# Patient Record
Sex: Male | Born: 1960
Health system: Southern US, Community
[De-identification: ages and names within clinical notes are randomized; demographics above are authoritative.]

## PROBLEM LIST (undated history)

## (undated) DIAGNOSIS — I2699 Other pulmonary embolism without acute cor pulmonale: Secondary | ICD-10-CM

## (undated) DIAGNOSIS — M109 Gout, unspecified: Secondary | ICD-10-CM

## (undated) DIAGNOSIS — M51369 Other intervertebral disc degeneration, lumbar region without mention of lumbar back pain or lower extremity pain: Secondary | ICD-10-CM

## (undated) DIAGNOSIS — E119 Type 2 diabetes mellitus without complications: Secondary | ICD-10-CM

## (undated) DIAGNOSIS — C801 Malignant (primary) neoplasm, unspecified: Secondary | ICD-10-CM

## (undated) DIAGNOSIS — I10 Essential (primary) hypertension: Secondary | ICD-10-CM

## (undated) DIAGNOSIS — M5136 Other intervertebral disc degeneration, lumbar region: Secondary | ICD-10-CM

## (undated) DIAGNOSIS — K529 Noninfective gastroenteritis and colitis, unspecified: Secondary | ICD-10-CM

## (undated) DIAGNOSIS — E785 Hyperlipidemia, unspecified: Secondary | ICD-10-CM

## (undated) DIAGNOSIS — F17200 Nicotine dependence, unspecified, uncomplicated: Secondary | ICD-10-CM

## (undated) HISTORY — DX: Other intervertebral disc degeneration, lumbar region without mention of lumbar back pain or lower extremity pain: M51.369

## (undated) HISTORY — DX: Other pulmonary embolism without acute cor pulmonale: I26.99

## (undated) HISTORY — DX: Other intervertebral disc degeneration, lumbar region: M51.36

## (undated) HISTORY — PX: COLONOSCOPY: SHX174

## (undated) HISTORY — DX: Hyperlipidemia, unspecified: E78.5

## (undated) HISTORY — PX: TONSILLECTOMY: SUR1361

## (undated) HISTORY — DX: Noninfective gastroenteritis and colitis, unspecified: K52.9

## (undated) HISTORY — DX: Nicotine dependence, unspecified, uncomplicated: F17.200

## (undated) HISTORY — DX: Gout, unspecified: M10.9

---

## 1998-02-28 ENCOUNTER — Emergency Department (HOSPITAL_COMMUNITY): Admission: EM | Admit: 1998-02-28 | Discharge: 1998-02-28 | Payer: Self-pay | Admitting: Emergency Medicine

## 1998-03-02 ENCOUNTER — Emergency Department (HOSPITAL_COMMUNITY): Admission: EM | Admit: 1998-03-02 | Discharge: 1998-03-02 | Payer: Self-pay | Admitting: Emergency Medicine

## 1998-03-09 ENCOUNTER — Ambulatory Visit: Admission: RE | Admit: 1998-03-09 | Discharge: 1998-03-09 | Payer: Self-pay | Admitting: Otolaryngology

## 1998-04-29 ENCOUNTER — Ambulatory Visit: Admission: RE | Admit: 1998-04-29 | Discharge: 1998-04-29 | Payer: Self-pay | Admitting: Otolaryngology

## 1998-08-24 ENCOUNTER — Other Ambulatory Visit: Admission: RE | Admit: 1998-08-24 | Discharge: 1998-08-24 | Payer: Self-pay | Admitting: Otolaryngology

## 2001-12-18 ENCOUNTER — Encounter: Payer: Self-pay | Admitting: Family Medicine

## 2001-12-18 ENCOUNTER — Encounter: Admission: RE | Admit: 2001-12-18 | Discharge: 2001-12-18 | Payer: Self-pay | Admitting: Family Medicine

## 2001-12-30 ENCOUNTER — Ambulatory Visit (HOSPITAL_COMMUNITY): Admission: RE | Admit: 2001-12-30 | Discharge: 2001-12-30 | Payer: Self-pay | Admitting: Family Medicine

## 2003-01-19 ENCOUNTER — Encounter: Admission: RE | Admit: 2003-01-19 | Discharge: 2003-04-19 | Payer: Self-pay | Admitting: Internal Medicine

## 2007-04-11 ENCOUNTER — Emergency Department (HOSPITAL_COMMUNITY): Admission: EM | Admit: 2007-04-11 | Discharge: 2007-04-11 | Payer: Self-pay | Admitting: Emergency Medicine

## 2007-05-21 ENCOUNTER — Encounter: Admission: RE | Admit: 2007-05-21 | Discharge: 2007-08-19 | Payer: Self-pay | Admitting: Internal Medicine

## 2009-03-23 ENCOUNTER — Emergency Department (HOSPITAL_COMMUNITY): Admission: EM | Admit: 2009-03-23 | Discharge: 2009-03-23 | Payer: Self-pay | Admitting: Emergency Medicine

## 2011-02-19 LAB — COMPREHENSIVE METABOLIC PANEL
ALT: 16 U/L (ref 0–53)
AST: 19 U/L (ref 0–37)
Alkaline Phosphatase: 74 U/L (ref 39–117)
CO2: 29 mEq/L (ref 19–32)
Chloride: 105 mEq/L (ref 96–112)
Creatinine, Ser: 1.05 mg/dL (ref 0.4–1.5)
GFR calc Af Amer: >60 mL/min (ref >60)
GFR calc non Af Amer: >60 mL/min (ref >60)
Potassium: 3.4 mEq/L — ABNORMAL LOW (ref 3.5–5.1)
Total Bilirubin: 0.7 mg/dL (ref 0.3–1.2)

## 2011-02-19 LAB — DIFFERENTIAL
Basophils Absolute: 0 10*3/uL (ref 0.0–0.1)
Basophils Relative: 1 % (ref 0–1)
Eosinophils Absolute: 0.2 10*3/uL (ref 0.0–0.7)
Eosinophils Relative: 3 % (ref 0–5)
Lymphocytes Relative: 26 % (ref 12–46)
Monocytes Absolute: 0.5 10*3/uL (ref 0.1–1.0)

## 2011-02-19 LAB — CBC
MCV: 89.6 fL (ref 78.0–100.0)
RBC: 4.45 MIL/uL (ref 4.22–5.81)
WBC: 6.3 10*3/uL (ref 4.0–10.5)

## 2011-02-19 LAB — HEMOCCULT GUIAC POC 1CARD (OFFICE): Fecal Occult Bld: POSITIVE

## 2013-04-14 ENCOUNTER — Emergency Department (HOSPITAL_COMMUNITY)
Admission: EM | Admit: 2013-04-14 | Discharge: 2013-04-14 | Disposition: A | Discharge status: Home / Self Care | Payer: 59 | Attending: Emergency Medicine | Admitting: Emergency Medicine

## 2013-04-14 ENCOUNTER — Emergency Department (HOSPITAL_COMMUNITY): Payer: 59

## 2013-04-14 ENCOUNTER — Encounter (HOSPITAL_COMMUNITY): Payer: Self-pay | Admitting: Emergency Medicine

## 2013-04-14 DIAGNOSIS — Z79899 Other long term (current) drug therapy: Secondary | ICD-10-CM | POA: Insufficient documentation

## 2013-04-14 DIAGNOSIS — F17200 Nicotine dependence, unspecified, uncomplicated: Secondary | ICD-10-CM | POA: Insufficient documentation

## 2013-04-14 DIAGNOSIS — E119 Type 2 diabetes mellitus without complications: Secondary | ICD-10-CM | POA: Insufficient documentation

## 2013-04-14 DIAGNOSIS — I10 Essential (primary) hypertension: Secondary | ICD-10-CM | POA: Insufficient documentation

## 2013-04-14 HISTORY — DX: Type 2 diabetes mellitus without complications: E11.9

## 2013-04-14 HISTORY — DX: Essential (primary) hypertension: I10

## 2013-04-14 LAB — URINALYSIS, ROUTINE W REFLEX MICROSCOPIC
Bilirubin Urine: NEGATIVE
Ketones, ur: NEGATIVE mg/dL
Nitrite: NEGATIVE
Specific Gravity, Urine: 1.006 (ref 1.005–1.030)
Urobilinogen, UA: 1 mg/dL (ref 0.0–1.0)
pH: 7 (ref 5.0–8.0)

## 2013-04-14 LAB — CBC
MCHC: 34.9 g/dL (ref 30.0–36.0)
Platelets: 201 10*3/uL (ref 150–400)
RDW: 13.7 % (ref 11.5–15.5)
WBC: 5.9 10*3/uL (ref 4.0–10.5)

## 2013-04-14 LAB — BASIC METABOLIC PANEL
Chloride: 102 mEq/L (ref 96–112)
GFR calc Af Amer: >90 mL/min (ref >90)
GFR calc non Af Amer: >90 mL/min (ref >90)
Potassium: 2.8 mEq/L — ABNORMAL LOW (ref 3.5–5.1)

## 2013-04-14 LAB — URINE MICROSCOPIC-ADD ON

## 2013-04-14 MED ORDER — HYDROCODONE-ACETAMINOPHEN 5-325 MG PO TABS
1.0000 | ORAL_TABLET | Freq: Once | ORAL | Status: AC
  Administered 2013-04-14: 1 via ORAL
  Filled 2013-04-14: qty 1

## 2013-04-14 MED ORDER — IBUPROFEN 800 MG PO TABS
800.0000 mg | ORAL_TABLET | Freq: Once | ORAL | Status: AC
  Administered 2013-04-14: 800 mg via ORAL
  Filled 2013-04-14: qty 1

## 2013-04-14 MED ORDER — HYDROCODONE-ACETAMINOPHEN 5-325 MG PO TABS: 1.0000 | ORAL_TABLET | Freq: Four times a day (QID) | ORAL | Status: DC | PRN

## 2013-04-14 NOTE — ED Notes (Addendum)
C/o intermittent headache over the past 2 weeks.  No neuro deficits noted on triage exam.  Reports being seen at [facility] on Friday for swelling to L wrist and was started on celebrex and blood pressure medication.  Reports history of HTN but prior to Friday had been off of medication due to insurance reasons for over 2 years.

## 2013-04-14 NOTE — ED Notes (Signed)
Pt presents with persistent headache for the past 2 years, states he stopped taking his hypertension medication 2 years ago.  Pt BP elevated upon arrival to room 209/129.  Pt states he went to Urgent Care last week and was given samples of medications.  Pt instructed to return to ED if headaches continued.  Alert and oriented X4, neuro exam normal.

## 2013-04-14 NOTE — ED Provider Notes (Signed)
History     CSN: [id number]  Arrival date & time 04/14/13  1700   First MD Initiated Contact with Patient 04/14/13 1932      Chief Complaint  Patient presents with  . Headache  . Hypertension    (Consider location/radiation/quality/duration/timing/severity/associated sxs/prior treatment) HPI  Samuel Butler is a 52 y.o. male with a past medical history of DM and HTN who presents to the ED with a complaint of headache. He was seen at urgent care 2 weeks ago and was incidentally treated for HTN for which he has been noncompliant for over 2 years. He has experienced intermittent headaches for the past 2.5 years since he has been off of his hypertension medication and reports that the HAs are getting worse. The pt states that for the last week he has awoken with headaches that have lasted "half the day." The headaches are located above the pt's left eye, worsen upon ambulation, and are associated with blurry vision and black spots. Pt denies dizziness, confusion, nuchal rigidity, nausea, vomiting, or diarrhea. Pt smokes 5 cigarettes/day.   Past Medical History  Diagnosis Date  . Hypertension   . Diabetes mellitus without complication     Past Surgical History  Procedure Laterality Date  . Tonsillectomy    . Colonoscopy      No family history on file.  History  Substance Use Topics  . Smoking status: Current Every Day Smoker  . Smokeless tobacco: Not on file  . Alcohol Use: Yes     Comment: occasional      Review of Systems  Allergies  Review of patient's allergies indicates no known allergies.  Home Medications   Current Outpatient Rx  Name  Route  Sig  Dispense  Refill  . acetaminophen (TYLENOL) 500 MG tablet   Oral   Take 1,000 mg by mouth every 6 (six) hours as needed for pain.         Marland Kitchen ibuprofen (ADVIL,MOTRIN) 200 MG tablet   Oral   Take 200 mg by mouth every 6 (six) hours as needed for pain.         . naproxen sodium (ANAPROX) 220 MG tablet   Oral   Take 220 mg by mouth 2 (two) times daily as needed (pain).         . celecoxib (CELEBREX) 200 MG capsule   Oral   Take 200 mg by mouth 2 (two) times daily.         Marland Kitchen telmisartan (MICARDIS) 80 MG tablet   Oral   Take 80 mg by mouth 3 (three) times daily.           BP 209/129  Pulse 76  Temp(Src) 98.2 F (36.8 C) (Oral)  Resp 18  SpO2 99%  Physical Exam  Nursing note and vitals reviewed. Constitutional: He is oriented to person, place, and time. He appears well-developed and well-nourished. No distress.  HENT:  Head: Normocephalic and atraumatic.  Eyes: Conjunctivae and EOM are normal. Pupils are equal, round, and reactive to light. No scleral icterus.  Neck: Normal range of motion.  Neck supple with no nuchal rigidity, full pain free ROM. No carotid bruit  Cardiovascular: Normal rate, regular rhythm, normal heart sounds and intact distal pulses.   Pulmonary/Chest: Effort normal and breath sounds normal. No respiratory distress.  Musculoskeletal: Normal range of motion.  Neurological: He is alert and oriented to person, place, and time. He has normal strength.  CN III-XII intact, good coordination, normal gait, strength 5/5  bilaterally, intact distal sensation.  Skin: Skin is warm and dry.  No rash, non diaphoretic    ED Course  Procedures (including critical care time)  Labs Reviewed  CBC  BASIC METABOLIC PANEL  URINALYSIS, ROUTINE W REFLEX MICROSCOPIC   Ct Head Wo Contrast  04/14/2013   *RADIOLOGY REPORT*  Clinical Data: Headache, hypertension.  CT HEAD WITHOUT CONTRAST  Technique:  Contiguous axial images were obtained from the base of the skull through the vertex without contrast.  Comparison: None.  Findings: Chronic small vessel changes noted within the left frontal white matter. No acute intracranial abnormality. Specifically, no hemorrhage, hydrocephalus, mass lesion, acute infarction, or significant intracranial injury.  No acute calvarial abnormality.  Visualized paranasal sinuses and mastoids clear. Orbital soft tissues unremarkable.  IMPRESSION: No acute intracranial abnormality.   Original Report Authenticated By: Charlett Nose, M.D.     No diagnosis found.    MDM  Hypertension Headache  52 year old male with a history of high blood pressure and diabetes as well as medication noncompliance x3 years presents emergency department complaining of headache and high blood pressure.  Patient was seen in urgent care  2 weeks ago and started on my part is without improvement of blood pressure.  Patient has a scheduled followup appointment with a new primary care physician this Friday.  Labs and imaging reviewed without signs of acute intercranial injury or organ failure.  Strict return precautions discussed.  Patient verbalizes understanding.  Patient is aware of importance to keep follow up appointment and medication compliance.        Jaci Carrel, [place] 04/15/13 0107

## 2013-04-14 NOTE — ED Notes (Signed)
Pt returned from CT °

## 2013-04-16 ENCOUNTER — Ambulatory Visit (INDEPENDENT_AMBULATORY_CARE_PROVIDER_SITE_OTHER): Payer: 59 | Admitting: Family Medicine

## 2013-04-16 ENCOUNTER — Encounter: Payer: Self-pay | Admitting: Family Medicine

## 2013-04-16 VITALS — BP 162/120 | Temp 98.3°F | Ht 72.0 in | Wt 225.0 lb

## 2013-04-16 DIAGNOSIS — E876 Hypokalemia: Secondary | ICD-10-CM

## 2013-04-16 DIAGNOSIS — I10 Essential (primary) hypertension: Secondary | ICD-10-CM

## 2013-04-16 DIAGNOSIS — E1165 Type 2 diabetes mellitus with hyperglycemia: Secondary | ICD-10-CM

## 2013-04-16 DIAGNOSIS — E785 Hyperlipidemia, unspecified: Secondary | ICD-10-CM

## 2013-04-16 DIAGNOSIS — Z7189 Other specified counseling: Secondary | ICD-10-CM

## 2013-04-16 DIAGNOSIS — Z7689 Persons encountering health services in other specified circumstances: Secondary | ICD-10-CM

## 2013-04-16 DIAGNOSIS — IMO0002 Reserved for concepts with insufficient information to code with codable children: Secondary | ICD-10-CM

## 2013-04-16 DIAGNOSIS — M109 Gout, unspecified: Secondary | ICD-10-CM

## 2013-04-16 HISTORY — DX: Gout, unspecified: M10.9

## 2013-04-16 LAB — LIPID PANEL
HDL: 54.9 mg/dL (ref >39.00)
Total CHOL/HDL Ratio: 4
Triglycerides: 51 mg/dL (ref 0.0–149.0)

## 2013-04-16 LAB — BASIC METABOLIC PANEL
BUN: 14 mg/dL (ref 6–23)
CO2: 34 mEq/L — ABNORMAL HIGH (ref 19–32)
GFR: 100.41 mL/min (ref >60.00)
Glucose, Bld: 92 mg/dL (ref 70–99)
Potassium: 3.4 mEq/L — ABNORMAL LOW (ref 3.5–5.1)
Sodium: 142 mEq/L (ref 135–145)

## 2013-04-16 LAB — MICROALBUMIN / CREATININE URINE RATIO: Microalb, Ur: 13.9 mg/dL — ABNORMAL HIGH (ref 0.0–1.9)

## 2013-04-16 LAB — URIC ACID: Uric Acid, Serum: 8.1 mg/dL — ABNORMAL HIGH (ref 4.0–7.8)

## 2013-04-16 MED ORDER — AMLODIPINE BESYLATE 5 MG PO TABS: 10.0000 mg | ORAL_TABLET | Freq: Every day | ORAL | Status: DC

## 2013-04-16 NOTE — ED Provider Notes (Signed)
Medical screening examination/treatment/procedure(s) were performed by non-physician practitioner and as supervising physician I was immediately available for consultation/collaboration.   Jordin Dambrosio M Felica Chargois, DO 04/16/13 1510 

## 2013-04-16 NOTE — Progress Notes (Signed)
Chief Complaint  Patient presents with  . Establish Care  . Hypertension    headache, blurred vision, pains in arms, neck, and shoulders     HPI:  Samuel Butler is here to establish care.  Last PCP and physical: has not been under a doctor's care in several years as has not had insurance until recently   Has the following chronic problems and concerns today:  1)HTN -reports long hx labile HTN, FH HTN - used to see Dr. Elisabeth Most, lost insurance several years ago and stopped all medications - used to be on several BP meds, now has insurance and wants to get back under care -Reviewed chart: ED visit 6/4 for HA, HTN with neg CT of head, CBC and BMP remarkable for hypokalemia not mentioned in ED notes -denies:denies CP, SOB, HA, SOB, Palpitations - was having HA, blurred vision when seen in ED now resolved  2)DM and HLD: -meds: used to be on medications for this but hasn't taken any medications in several years -last eye exam: can't remember - several years ago -home BS: no monitor at home -denies: polyuria, polydipsia, skin lesions  3)wrist swelling -about one month ago -swelled suddenly and painful, took -ibuprofen and went away -thinks gout, brother has gout -denies: fevers, chills, injury  4)Tobacco -not ready to quit today but trying to cut back  Review of systems neg except where stated above  Patient Active Problem List   Diagnosis Date Noted  . Essential hypertension, benign 04/16/2013  . Type II or unspecified type diabetes mellitus with unspecified complication, uncontrolled 04/16/2013  . Hyperlipemia 04/16/2013  . Gout 04/16/2013    ROS: See pertinent positives and negatives per HPI.  Past Medical History  Diagnosis Date  . Hypertension   . Diabetes mellitus without complication   . Hyperlipidemia     Family History  Problem Relation Age of Onset  . Hypertension Father   . Diabetes Father   . Stroke Father     History   Social History  .  Marital Status: Married    Spouse Name: N/A    Number of Children: N/A  . Years of Education: N/A   Social History Main Topics  . Smoking status: Current Every Day Smoker    Types: Cigarettes  . Smokeless tobacco: None     Comment: per patient 5 cigarettes a day   . Alcohol Use: Yes     Comment: occasional  . Drug Use: No  . Sexually Active: None   Other Topics Concern  . 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  GENERAL: vitals reviewed and listed above, alert, oriented, appears well hydrated and in no acute distress  HEENT: atraumatic, conjunttiva clear, no obvious abnormalities on inspection of external nose and ears  NECK: no obvious masses on inspection  LUNGS:  clear to auscultation bilaterally, no wheezes, rales or rhonchi, good air movement  CV: HRRR, no peripheral edema  MS: moves all extremities without noticeable abnormality  PSYCH: pleasant and cooperative, no obvious depression or anxiety  ASSESSMENT AND PLAN:  Discussed the following assessment and plan:  Encounter to establish care 52 yo AAM with MMP whom has not had medical care in several years due to loss of insurance here to establish care with multiple acute issues.  Essential hypertension, benign - Plan:  -Basic metabolic panel, amLODipine (NORVASC) 5 MG tablet -chronic HTN, on multiple meds in the past but lost insurance and has not had treatment in several  years -feel well today -discussed options and given ? Recent gout flare and hypokalemia on recent labs opted to start with norvasc (risks discussed), then may add arb or acei pending labs, close follow up, return and ED precautions  Type II or unspecified type diabetes mellitus with unspecified complication, uncontrolled - Plan:  -Hemoglobin A1c, Microalbumin/Creatinine Ratio, Urine -referral to diabetes educator pending labs for monitor and lifestyle counseling -advised to schedule eye doctor appt -tx pending labs  Hyperlipemia - Plan: -Lipid Panel -tx pending labs  Gout - Plan:  -Uric Acid today -follow up acutely if further flares  Hypokalemia: -repeat BMP  Health Maintenance: -will need CPE/health maintenance exam once acute issues addressed  -We reviewed the PMH, PSH, FH, SH, Meds and Allergies. -We provided refills for any medications we will prescribe as needed. -We addressed current concerns per orders and patient instructions. -We have advised patient to follow up per instructions below. -> 45 minutes spent face to face  -Patient advised to return or notify a doctor immediately if symptoms worsen or persist or new concerns arise.  Patient Instructions  -We have ordered labs or studies at this visit. It can take up to 1-2 weeks for results and processing. We will contact you with instructions IF your results are abnormal. Normal results will be released to your [facility]. If you have not heard from [place] or can not find your results in [facility] in 2 weeks please contact our office.  -PLEASE SIGN UP FOR MYCHART TODAY   We recommend the following healthy lifestyle measures: - eat a healthy diet consisting of lots of vegetables, fruits, beans, nuts, seeds, healthy meats such as white chicken and fish and whole grains.  - avoid fried foods, fast food, processed foods, sodas, red meet and other fattening foods.  - get a least 150 minutes of aerobic exercise per week.   Referral  sent for diabetes educator for monitor and help with lifestyle changes   Start the norvasc for your blood pressure - 5 mg daily for 1 week, then increase to 10 mg daily  Follow up in: 2 weeks or sooner if needed      KIM, HANNAH R.

## 2013-04-16 NOTE — Patient Instructions (Signed)
-  We have ordered labs or studies at this visit. It can take up to 1-2 weeks for results and processing. We will contact you with instructions IF your results are abnormal. Normal results will be released to your [facility]. If you have not heard from [place] or can not find your results in [facility] in 2 weeks please contact our office.  -PLEASE SIGN UP FOR MYCHART TODAY   We recommend the following healthy lifestyle measures: - eat a healthy diet consisting of lots of vegetables, fruits, beans, nuts, seeds, healthy meats such as white chicken and fish and whole grains.  - avoid fried foods, fast food, processed foods, sodas, red meet and other fattening foods.  - get a least 150 minutes of aerobic exercise per week.   Referral sent for diabetes educator for monitor and help with lifestyle changes   Start the norvasc for your blood pressure - 5 mg daily for 1 week, then increase to 10 mg daily  Follow up in: 2 weeks or sooner if needed

## 2013-04-19 ENCOUNTER — Encounter: Payer: Self-pay | Admitting: Family Medicine

## 2013-04-19 NOTE — Progress Notes (Signed)
Quick Note:  No answer at home number; will attempt to call at a later time. ______

## 2013-04-20 NOTE — Progress Notes (Signed)
Quick Note:  Left a message at pt's cell phone number to return call. ______

## 2013-04-20 NOTE — Progress Notes (Signed)
Quick Note:  Attempted to call pt and operator states " the caller cannot accept calls at this time" ______

## 2013-04-22 NOTE — Progress Notes (Signed)
Quick Note:  Called and spoke with pt and pt is aware. Pt aware of appt on 6/23 ______

## 2013-04-27 ENCOUNTER — Encounter: Payer: Self-pay | Admitting: Family Medicine

## 2013-04-27 ENCOUNTER — Telehealth: Payer: Self-pay | Admitting: Family Medicine

## 2013-04-27 NOTE — Telephone Encounter (Signed)
Tried to return call regarding gout. LM on pt's identified voice mail.

## 2013-04-27 NOTE — Telephone Encounter (Signed)
Patient Information:  Caller Name: [place]  Phone: [phone]  Patient: Samuel Butler  Gender: Male  DOB: Mar 16, 1961  Age: 52 Years  PCP: Kriste Basque [facility])  Office Follow Up:  Does the office need to follow up with this patient?: Yes  Instructions For The Office: Patient declines appt. Patient is requesting a Rx for Gout to be called into [facility] on [address] at [phone]. Please return call to patient at [phone].  RN Note:  Patient states he developed pain, redness and swelling of right wrist, onset X 3 weeks. States he was seen in the Urgent Care approx. 2 weeks ago. States he had negative Xray and was diagnosed with gout. Patient states he was given a Rx at the Urgent Care but he did not want to get the medication filled. States he was seen in office 04/16/13 by Dr. Selena Batten. Patient states pain and swelling in wrist increased 04/19/13. Patient states wrist "throbs" at night. RN reviewed Counselling psychologist Health Record. Uric Acid level was 8.1 on 04/16/13. Care advice given per guidelines. Call back parameters reviewed. Patient verbalizes understanding. Patient declines appt. Patient is requesting a Rx for Gout to be called into [facility] on [address] at [phone]. Please return call to patient at [phone].  Symptoms  Reason For Call & Symptoms: Pain and swelling in left wrist  Reviewed Health History In EMR: Yes  Reviewed Medications In EMR: Yes  Reviewed Allergies In EMR: Yes  Reviewed Surgeries / Procedures: Yes  Date of Onset of Symptoms: 04/06/2013  Treatments Tried: Tylenol  Treatments Tried Worked: No  Guideline(s) Used:  Hand and Wrist Pain  Disposition Per Guideline:   See Within 3 Days in Office  Reason For Disposition Reached:   Moderate pain (e.g., interferes with normal activities) and present > 3 days  Advice Given:  Pain Medicines:  For pain relief, you can take either acetaminophen, ibuprofen, or naproxen.  Call  Back If:  Moderate pain (e.g. interferes with normal activities) lasts more than 3 days  You become worse.  Patient Refused Recommendation:  Patient Requests Prescription  Patient declines appt. Patient is requesting a Rx for Gout to be called into [facility] on [address] at [phone]. Please return call to patient at [phone].

## 2013-04-27 NOTE — Telephone Encounter (Signed)
Can take ibuprofen as this worked last time for his gout or can use rx from [facility]. Another option is to be seen tomorrow for this and we can discuss his labs as well.

## 2013-04-27 NOTE — Telephone Encounter (Signed)
pls advise

## 2013-04-27 NOTE — Telephone Encounter (Signed)
Left a detailed message for pt at designated cell phone number.  

## 2013-04-27 NOTE — Telephone Encounter (Signed)
noted 

## 2013-05-03 ENCOUNTER — Ambulatory Visit (INDEPENDENT_AMBULATORY_CARE_PROVIDER_SITE_OTHER): Payer: 59 | Admitting: Family Medicine

## 2013-05-03 ENCOUNTER — Encounter: Payer: Self-pay | Admitting: Family Medicine

## 2013-05-03 VITALS — BP 155/100 | Temp 98.4°F | Wt 223.0 lb

## 2013-05-03 DIAGNOSIS — E119 Type 2 diabetes mellitus without complications: Secondary | ICD-10-CM

## 2013-05-03 DIAGNOSIS — M109 Gout, unspecified: Secondary | ICD-10-CM

## 2013-05-03 DIAGNOSIS — I10 Essential (primary) hypertension: Secondary | ICD-10-CM

## 2013-05-03 DIAGNOSIS — E785 Hyperlipidemia, unspecified: Secondary | ICD-10-CM

## 2013-05-03 MED ORDER — AMLODIPINE BESYLATE 10 MG PO TABS: 10.0000 mg | ORAL_TABLET | Freq: Every day | ORAL | Status: DC

## 2013-05-03 MED ORDER — LOSARTAN POTASSIUM 25 MG PO TABS: 25.0000 mg | ORAL_TABLET | Freq: Every day | ORAL | Status: DC

## 2013-05-03 NOTE — Progress Notes (Addendum)
Chief Complaint  Patient presents with  . 2 week follow up    HPI:  Follow up HTN: -recently started norvasc after he had been out of meds for a long time -denies: CP, SOB, palpitations, swelling  HLD: -LDL 165 -he is reluctant to start a medication  Prediabetes: -advised lifestyle changes -He is going to start doing 30 minutes of CV exercise on treadmill 4 days a week -diet: eats poorly at work - will start taking lunch  Gout: -recent flare - resolved  ROS: See pertinent positives and negatives per HPI.  Past Medical History  Diagnosis Date  . Hypertension   . Diabetes mellitus without complication   . Hyperlipidemia     Family History  Problem Relation Age of Onset  . Hypertension Father   . Diabetes Father   . Stroke Father     History   Social History  . Marital Status: Married    Spouse Name: N/A    Number of Children: N/A  . Years of Education: N/A   Social History Main Topics  . Smoking status: Current Every Day Smoker    Types: Cigarettes  . Smokeless tobacco: None     Comment: per patient 5 cigarettes a day   . Alcohol Use: Yes     Comment: occasional  . Drug Use: No  . Sexually Active: None   Other Topics Concern  . 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  GENERAL: vitals reviewed and listed above, alert, oriented, appears well hydrated and in no acute distress  HEENT: atraumatic, conjunttiva clear,  no obvious abnormalities on inspection of external nose and ears  NECK: no obvious masses on inspection  LUNGS: clear to auscultation bilaterally, no wheezes, rales or rhonchi, good air movement  CV: HRRR, no peripheral edema  MS: moves all extremities without noticeable abnormality  PSYCH: pleasant and cooperative, no obvious depression or anxiety  ASSESSMENT AND PLAN:  Discussed the following assessment and plan:  Essential hypertension, benign - Plan: losartan (COZAAR) 25 MG tablet, amLODipine (NORVASC) 10 MG tablet  Type II or unspecified type diabetes mellitus without mention of complication, not stated as uncontrolled  Hyperlipemia  Gout  -discussed CV risks -restarting arb after discussion risks/benefits, continue norvasc -advised statin, but he is reluctant to start this, prefers to trial of diet and exercise for 3 months then will recheck -Patient advised to return or notify a doctor immediately if symptoms worsen or persist or new concerns arise.  Patient Instructions  -exercise for 30 minute 4 times per week - cardiovascular range  -eat a healthier lunch, cut down on sweetened beveraged  -start new blood pressure medication (losartan) and continue the 10mg  daily of Norvasc  -follow up in 3 months for morning appointment and come fasting - will recheck labs then  Colin Benton R.

## 2013-05-03 NOTE — Patient Instructions (Signed)
-  exercise for 30 minute 4 times per week - cardiovascular range  -eat a healthier lunch, cut down on sweetened beveraged  -start new blood pressure medication (losartan) and continue the 10mg  daily of Norvasc  -follow up in 3 months for morning appointment and come fasting - will recheck labs then

## 2013-05-19 ENCOUNTER — Encounter: Payer: Self-pay | Admitting: Family Medicine

## 2013-05-19 NOTE — Progress Notes (Signed)
Received some OV notes from prior PCP from 2011 and prior. Hx uncontrolled BP on numerous medications (tekturn/hctz, bystolic and caduet), HLD.  Labs/studies:  CBC, BMP, LFT 2012 ok Lipids 2012 HgbA1c 2012 6.1  Colonoscopy in 2010  EKG 2011  All scanned in.

## 2013-05-31 ENCOUNTER — Encounter: Payer: Self-pay | Admitting: Internal Medicine

## 2013-08-03 ENCOUNTER — Ambulatory Visit: Payer: 59 | Admitting: Family Medicine

## 2013-09-16 ENCOUNTER — Other Ambulatory Visit: Payer: Self-pay

## 2013-09-17 ENCOUNTER — Ambulatory Visit: Payer: 59 | Admitting: Family Medicine

## 2013-09-24 ENCOUNTER — Ambulatory Visit: Payer: 59 | Admitting: Family Medicine

## 2013-10-08 ENCOUNTER — Ambulatory Visit (INDEPENDENT_AMBULATORY_CARE_PROVIDER_SITE_OTHER): Payer: 59 | Admitting: Family Medicine

## 2013-10-08 ENCOUNTER — Encounter: Payer: Self-pay | Admitting: Family Medicine

## 2013-10-08 VITALS — BP 140/90 | Temp 98.3°F | Wt 225.0 lb

## 2013-10-08 DIAGNOSIS — E785 Hyperlipidemia, unspecified: Secondary | ICD-10-CM

## 2013-10-08 DIAGNOSIS — E119 Type 2 diabetes mellitus without complications: Secondary | ICD-10-CM

## 2013-10-08 DIAGNOSIS — I10 Essential (primary) hypertension: Secondary | ICD-10-CM

## 2013-10-08 LAB — BASIC METABOLIC PANEL
Calcium: 8.7 mg/dL (ref 8.4–10.5)
GFR: 127.82 mL/min (ref >60.00)
Potassium: 3.3 mEq/L — ABNORMAL LOW (ref 3.5–5.1)
Sodium: 141 mEq/L (ref 135–145)

## 2013-10-08 LAB — LDL CHOLESTEROL, DIRECT: Direct LDL: 144.7 mg/dL

## 2013-10-08 LAB — LIPID PANEL
HDL: 66.7 mg/dL (ref >39.00)
Total CHOL/HDL Ratio: 3
VLDL: 6.4 mg/dL (ref 0.0–40.0)

## 2013-10-08 LAB — HEMOGLOBIN A1C: Hgb A1c MFr Bld: 6.2 % (ref 4.6–6.5)

## 2013-10-08 NOTE — Progress Notes (Signed)
No chief complaint on file.   HPI:  Follow up:  HTN: -added losartan last visit to norvasc -reports: doing good,  -denies: Ha, CP, SOB, palpitations, swelling  HLD: -he had wanted to try lifestyle changes -walking a little, trying to watch portion size  Prediabetes: -lifestyle changes per above  ROS: See pertinent positives and negatives per HPI.  Past Medical History  Diagnosis Date  . Hypertension   . Diabetes mellitus without complication   . Hyperlipidemia     Past Surgical History  Procedure Laterality Date  . Tonsillectomy    . Colonoscopy      Family History  Problem Relation Age of Onset  . Hypertension Father   . Diabetes Father   . Stroke Father     History   Social History  . Marital Status: Married    Spouse Name: N/A    Number of Children: N/A  . Years of Education: N/A   Social History Main Topics  . Smoking status: Current Every Day Smoker    Types: Cigarettes  . Smokeless tobacco: None     Comment: per patient 5 cigarettes a day   . Alcohol Use: Yes     Comment: occasional  . Drug Use: No  . Sexual Activity: None   Other Topics Concern  . 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  GENERAL: vitals reviewed and listed above, alert, oriented, appears well hydrated and in no acute distress  HEENT: atraumatic, conjunttiva clear, no obvious abnormalities on inspection of external nose and  ears  NECK: no obvious masses on inspection  LUNGS: clear to auscultation bilaterally, no wheezes, rales or rhonchi, good air movement  CV: HRRR, no peripheral edema  MS: moves all extremities without noticeable abnormality  PSYCH: pleasant and cooperative, no obvious depression or anxiety  ASSESSMENT AND PLAN:  Discussed the following assessment and plan:  Type II or unspecified type diabetes mellitus without mention of complication, not stated as uncontrolled - Plan: Hemoglobin A1c  Essential hypertension, benign - Plan: Basic metabolic panel  Hyperlipemia - Plan: Lipid Panel  -fasting labs -continue current medications -lifestyle recs -discussed options for tx cholesterol if needed -follow up 4-6 months -Patient advised to return or notify a doctor immediately if symptoms worsen or persist or new concerns arise.  Patient Instructions  -schedule eye appointment  -We recommend the following healthy lifestyle measures: - eat a healthy diet consisting of lots of vegetables, fruits, beans, nuts, seeds, healthy meats such as white chicken and fish and whole grains.  - avoid fried foods, fast food, processed foods, sodas, red meet and other fattening foods.  - get a least 150 minutes of aerobic exercise per week.   Follow up in 4-6 months     Samuel Butler R.

## 2013-10-08 NOTE — Progress Notes (Signed)
Pre visit review using our clinic review tool, if applicable. No additional management support is needed unless otherwise documented below in the visit note. 

## 2013-10-08 NOTE — Progress Notes (Signed)
Quick Note:  Called and spoke with pt and pt is aware. ______ 

## 2013-10-08 NOTE — Patient Instructions (Signed)
-  schedule eye appointment  -We recommend the following healthy lifestyle measures: - eat a healthy diet consisting of lots of vegetables, fruits, beans, nuts, seeds, healthy meats such as white chicken and fish and whole grains.  - avoid fried foods, fast food, processed foods, sodas, red meet and other fattening foods.  - get a least 150 minutes of aerobic exercise per week.   Follow up in 4-6 months

## 2014-01-25 ENCOUNTER — Encounter: Payer: Self-pay | Admitting: Family Medicine

## 2014-01-25 DIAGNOSIS — E119 Type 2 diabetes mellitus without complications: Secondary | ICD-10-CM | POA: Insufficient documentation

## 2014-04-19 ENCOUNTER — Telehealth: Payer: Self-pay | Admitting: Family Medicine

## 2014-04-19 DIAGNOSIS — I10 Essential (primary) hypertension: Secondary | ICD-10-CM

## 2014-04-19 MED ORDER — AMLODIPINE BESYLATE 10 MG PO TABS: 10.0000 mg | ORAL_TABLET | Freq: Every day | ORAL | Status: DC

## 2014-04-19 MED ORDER — LOSARTAN POTASSIUM 25 MG PO TABS: 25.0000 mg | ORAL_TABLET | Freq: Every day | ORAL | Status: DC

## 2014-04-19 NOTE — Telephone Encounter (Signed)
[facility] [place], [place] - [address] Is requesting 90 day re-fills on the following: amLODipine (NORVASC) 10 MG tablet losartan (COZAAR) 25 MG tablet

## 2014-06-07 ENCOUNTER — Telehealth: Payer: Self-pay | Admitting: *Deleted

## 2014-06-07 DIAGNOSIS — E1139 Type 2 diabetes mellitus with other diabetic ophthalmic complication: Secondary | ICD-10-CM

## 2014-06-07 NOTE — Telephone Encounter (Signed)
Message sent to my chart for patient to schedule a follow diabetes appointment A1c, bmet. Micro albumin ordered Diabetic bundle

## 2014-06-13 NOTE — Telephone Encounter (Signed)
Patient will schedule an appointment with Dr Maudie Mercury

## 2014-06-22 ENCOUNTER — Telehealth: Payer: Self-pay | Admitting: Family Medicine

## 2014-06-22 DIAGNOSIS — I10 Essential (primary) hypertension: Secondary | ICD-10-CM

## 2014-06-22 NOTE — Telephone Encounter (Signed)
Pt needs a Friday am appt and the first avail cpe is 10/23. Do you need pt to come in for just a fup? If pt needs cpe/ pt will need enough med to get him through until 10/23 bc pt is out of his meds. (Pt's med was refused bc he needed appt.) amLODipine (NORVASC) 10 MG tablet losartan (COZAAR) 25 MG tablet [facility]

## 2014-06-23 MED ORDER — AMLODIPINE BESYLATE 10 MG PO TABS: 10.0000 mg | ORAL_TABLET | Freq: Every day | ORAL | Status: DC

## 2014-06-23 MED ORDER — LOSARTAN POTASSIUM 25 MG PO TABS: 25.0000 mg | ORAL_TABLET | Freq: Every day | ORAL | Status: DC

## 2014-06-23 NOTE — Telephone Encounter (Signed)
Rxs done. I tried contacting patient and automated message on the cell number stated he was unavailable.

## 2014-07-19 ENCOUNTER — Encounter: Payer: Self-pay | Admitting: Family Medicine

## 2014-07-19 ENCOUNTER — Ambulatory Visit (INDEPENDENT_AMBULATORY_CARE_PROVIDER_SITE_OTHER): Payer: 59 | Admitting: Family Medicine

## 2014-07-19 VITALS — BP 146/90 | HR 74 | Temp 98.1°F | Ht 72.0 in | Wt 209.0 lb

## 2014-07-19 DIAGNOSIS — M5410 Radiculopathy, site unspecified: Secondary | ICD-10-CM

## 2014-07-19 DIAGNOSIS — E785 Hyperlipidemia, unspecified: Secondary | ICD-10-CM

## 2014-07-19 DIAGNOSIS — IMO0002 Reserved for concepts with insufficient information to code with codable children: Secondary | ICD-10-CM

## 2014-07-19 DIAGNOSIS — E1139 Type 2 diabetes mellitus with other diabetic ophthalmic complication: Secondary | ICD-10-CM

## 2014-07-19 DIAGNOSIS — I10 Essential (primary) hypertension: Secondary | ICD-10-CM

## 2014-07-19 MED ORDER — LOSARTAN POTASSIUM-HCTZ 50-12.5 MG PO TABS: 1.0000 | ORAL_TABLET | Freq: Every day | ORAL | Status: DC

## 2014-07-19 NOTE — Patient Instructions (Signed)
FOR LEG PAIN: - do the exercises provided - do not put wallet in back pocket! -go get xrays    FOR BLOOD PRESSURE: -STOP the losartan -START the hyzaar (combination of losartan and hydrochlorthiazide)  Follow up as scheduled

## 2014-07-19 NOTE — Progress Notes (Signed)
Pre visit review using our clinic review tool, if applicable. No additional management support is needed unless otherwise documented below in the visit note. 

## 2014-07-19 NOTE — Progress Notes (Signed)
No chief complaint on file.   HPI:  Acute visit for:  1)"BP running high" -reports: BP up at his dentist appt today -denies: CP, SOB, HA, vision changes -meds: norvasc 10mg  daily, losartan 25 mg daily  2)R lat leg pain: -started about 1 year ago -mod tingling pain in R entire lat leg and buttock, thinks from his back, worse with sitting, better with activity -reports was seeing specialist in the past and has DDD, can't remember the name of the doctor -has low back pain intermittently, not now -denies: weakness, bowel or bladder dysfunction, malaise  Follow up:  2)HLD: -was to do trial of lifestyle interventions -has been eating less, watching diet, thinks this has helped as has lost weight -he never followed up as advised  3)hx of diabetes: -per his report on medications in the past Lab Results  Component Value Date   HGBA1C 6.2 10/08/2013  -denies: wounds, polyuria, vision changes  HM: -foot exam: completed -pneumo:  -flu: -optho exam:  ROS: See pertinent positives and negatives per HPI.  Past Medical History  Diagnosis Date  . Hypertension   . Diabetes mellitus without complication   . Hyperlipidemia     Past Surgical History  Procedure Laterality Date  . Tonsillectomy    . Colonoscopy      Family History  Problem Relation Age of Onset  . Hypertension Father   . Diabetes Father   . Stroke Father     History   Social History  . Marital Status: Married    Spouse Name: N/A    Number of Children: N/A  . Years of Education: N/A   Social History Main Topics  . Smoking status: Current Every Day Smoker    Types: Cigarettes  . Smokeless tobacco: None     Comment: per patient 5 cigarettes a day   . Alcohol Use: Yes     Comment: occasional  . Drug Use: No  . Sexual Activity: None   Other Topics Concern  . 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  GENERAL: vitals reviewed and listed above, alert, oriented, appears well hydrated and in no acute distress  HEENT: atraumatic, conjunttiva clear, no obvious abnormalities on inspection of external nose and ears  NECK: no obvious masses on inspection  LUNGS: clear to auscultation bilaterally, no wheezes, rales or rhonchi, good air movement  CV: HRRR, no peripheral edema  MS: moves all extremities without noticeable abnormality Normal Gait Normal inspection of back, no obvious scoliosis or leg length descrepancy No bony TTP Soft tissue TTP at: R Piriformis, large wallet in pocket -/+ tests: neg trendelenburg,-facet loading, -SLRT, -CLRT, -FABER, -FADIR Normal muscle strength, sensation to light touch and DTRs in LEs bilaterally  PSYCH: pleasant and cooperative, no obvious depression or anxiety  ASSESSMENT AND PLAN:  Discussed  the following assessment and plan:  Essential hypertension, benign - Plan: losartan-hydrochlorothiazide (HYZAAR) 50-12.5 MG per tablet -change in medication - stop losartan start hyzaar (losartan-hctz), close follow up  Hyperlipemia  Type II or unspecified type diabetes mellitus with ophthalmic manifestations, not stated as uncontrolled  Radicular leg pain - Plan: DG Lumbar Spine Complete -suspect piriformis issue versus LS radicular pain - discussed options, etiologies, workup -he opted for removing wallet, exercises, xrays, close follow up - PMR referral if persists  LABS at  physical  -Patient advised to return or notify a doctor immediately if symptoms worsen or persist or new concerns arise.  Patient Instructions  FOR LEG PAIN: - do the exercises provided - do not put wallet in back pocket! -go get xrays    FOR BLOOD PRESSURE: -STOP the losartan -START the hyzaar (combination of losartan and hydrochlorthiazide)  Follow up as scheduled     KIM, HANNAH R.

## 2014-09-02 ENCOUNTER — Encounter: Payer: Self-pay | Admitting: Family Medicine

## 2014-09-02 ENCOUNTER — Ambulatory Visit (INDEPENDENT_AMBULATORY_CARE_PROVIDER_SITE_OTHER): Payer: 59 | Admitting: Family Medicine

## 2014-09-02 VITALS — BP 132/90 | HR 54 | Temp 97.8°F | Ht 71.0 in | Wt 212.9 lb

## 2014-09-02 DIAGNOSIS — E119 Type 2 diabetes mellitus without complications: Secondary | ICD-10-CM

## 2014-09-02 DIAGNOSIS — E785 Hyperlipidemia, unspecified: Secondary | ICD-10-CM

## 2014-09-02 DIAGNOSIS — G44209 Tension-type headache, unspecified, not intractable: Secondary | ICD-10-CM

## 2014-09-02 DIAGNOSIS — Z23 Encounter for immunization: Secondary | ICD-10-CM

## 2014-09-02 DIAGNOSIS — I10 Essential (primary) hypertension: Secondary | ICD-10-CM

## 2014-09-02 DIAGNOSIS — Z0000 Encounter for general adult medical examination without abnormal findings: Secondary | ICD-10-CM

## 2014-09-02 DIAGNOSIS — K529 Noninfective gastroenteritis and colitis, unspecified: Secondary | ICD-10-CM

## 2014-09-02 DIAGNOSIS — M25562 Pain in left knee: Secondary | ICD-10-CM

## 2014-09-02 LAB — BASIC METABOLIC PANEL
BUN: 14 mg/dL (ref 6–23)
CO2: 28 meq/L (ref 19–32)
Calcium: 8.9 mg/dL (ref 8.4–10.5)
Chloride: 105 mEq/L (ref 96–112)
Creatinine, Ser: 0.9 mg/dL (ref 0.4–1.5)
GFR: 112.8 mL/min (ref >60.00)
GLUCOSE: 94 mg/dL (ref 70–99)
POTASSIUM: 3.9 meq/L (ref 3.5–5.1)
SODIUM: 142 meq/L (ref 135–145)

## 2014-09-02 LAB — LIPID PANEL
CHOL/HDL RATIO: 3
Cholesterol: 225 mg/dL — ABNORMAL HIGH (ref 0–200)
HDL: 72.9 mg/dL (ref >39.00)
LDL CALC: 145 mg/dL — AB (ref 0–99)
NonHDL: 152.1
TRIGLYCERIDES: 36 mg/dL (ref 0.0–149.0)
VLDL: 7.2 mg/dL (ref 0.0–40.0)

## 2014-09-02 LAB — HEMOGLOBIN A1C: Hgb A1c MFr Bld: 5.8 % (ref 4.6–6.5)

## 2014-09-02 NOTE — Progress Notes (Signed)
No chief complaint on file.   HPI:  Here for CPE:  -Concerns and/or follow up today: none  1)HTN: -added hctz 07/2014 -denies: CP, SOB, HA (other then below), vision changes  -meds: norvasc 10mg  daily, losartan-hctz 50-12.5 mg daily   2)R lat leg pain:  -started about 1 year ago - suspected piriformis syndrome versus radiculopathy from DDD 07/2014 --> advised xrays, HEP, no wallet -reports: doing much better, mild occ pain in R upper post leg, no tingling -reports was seeing specialist in the past and has DDD, can't remember the name of the doctor  -denies: weakness, bowel or bladder dysfunction, malaise    3)HLD:  -was to do trial of lifestyle interventions  -has been eating less, watching diet, thinks this has helped as has lost weight  -he never followed up as advised   4)hx of diabetes:  -per his report on medications in the past - none now -his last several hemoglobin a1cs have been good -denies: polyuria, polydipsia, vision changes  5)New Problem - tension in muscles in back of head and neck: -after long days of looking down at computer -mild, resolves with OTC analgesics -he has had these on and of chronically -thinks from strain on back of neck -denies: worsening, radiation, weakness or numbnes sin arms  -Diet: ok but does not eat on a regular basis  -Exercise: no regular exercise  -Diabetes and Dyslipidemia Screening: FASTING  -Vaccines: UTD  -sexual activity: yes, male partner, no new partners  -wants STI testing, Hep C screening (if born 59-1966): no  -FH colon or prstate ca: see FH Last colon cancer screening: in 2010 for blood in stools, reviewed report, no adenematous polyp, ? IBD - he reports never had any symptoms since and declines seeing GI or repeat unless symptoms Last prostate ca screening: declines after discussion risks/benefits and current guidelines  -Alcohol, Tobacco, drug use: see social history  Review of Systems - no fevers,  unintentional weight loss, vision loss, hearing loss, chest pain, sob, hemoptysis, melena, hematochezia, hematuria, genital discharge, changing or concerning skin lesions, bleeding, bruising, loc, thoughts of self harm or SI  Past Medical History  Diagnosis Date  . Hypertension   . Diabetes mellitus without complication   . Hyperlipidemia     Past Surgical History  Procedure Laterality Date  . Tonsillectomy    . Colonoscopy      Family History  Problem Relation Age of Onset  . Hypertension Father   . Diabetes Father   . Stroke Father     History   Social History  . Marital Status: Married    Spouse Name: N/A    Number of Children: N/A  . Years of Education: N/A   Social History Main Topics  . Smoking status: Current Every Day Smoker    Types: Cigarettes  . Smokeless tobacco: None     Comment: per patient 5 cigarettes a day   . Alcohol Use: Yes     Comment: occasional  . Drug Use: No  . Sexual Activity: None   Other Topics Concern  . 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   Weight as of this encounter: 212 lb 14.4 oz (96.571 kg).  GENERAL: vitals reviewed and listed below, alert, oriented, appears well hydrated and in no acute distress  HEENT: head atraumatic, PERRLA, normal appearance of eyes, ears, nose and mouth. moist mucus  membranes.  NECK: supple, no masses or lymphadenopathy  LUNGS: clear to auscultation bilaterally, no rales, rhonchi or wheeze  CV: HRRR, no peripheral edema or cyanosis, normal pedal pulses  ABDOMEN: bowel sounds normal, soft, non tender to palpation, no masses, no rebound or guarding  GU: declined  RECTAL: refused  SKIN: no rash or abnormal lesions  MS: normal gait, moves all extremities normally, normal ROM neck strength and sensation normal in UE bilat, TTP subocc muscles, no bony TTP  NEURO: CN II-XII grossly intact, normal muscle strength and sensation to light touch on extremities  PSYCH: normal affect, pleasant and cooperative  ASSESSMENT AND PLAN:  Discussed the following assessment and plan:  1. Encounter for preventive health examination -Discussed and advised all [place] preventive services health task force level A and B recommendations for age, sex and risks.  -Advised at least 150 minutes of exercise per week and a healthy diet low in saturated fats and sweets and consisting of fresh fruits and vegetables, lean meats such as fish and white chicken and whole grains.  -FASTING labs, studies and vaccines per orders this encounter  2. Essential hypertension, benign - Basic metabolic panel -BP improved on recheck to 136/88 -continue current meds and monitor  3. Hyperlipemia - Lipid Panel  4. Diet-controlled type 2 diabetes mellitus - Hemoglobin A1c -lifestyle recs  5. Tension-type headache, not intractable, unspecified chronicity pattern -chronic, seems likely neck, suboccipital muscle tension -neck spasm exercises and f/u in 4 weeks  6. Pain in joint, lower leg, left -musle improved, he opts to continue exercises -advised if not resolved in 3-4 weeks or recurs to get plain films, consider seeing PMR  7.  ? of IBD (inflammatory bowel disease) - work up for melena 2010  -discovered this on review of his colonoscopy path report from 2010 -he did not recall  being told of these findings but reports did not have insurance at the time and had no further symptoms  -offered eval with GI after discussion - he declined  8. Need for prophylactic vaccination and inoculation against influenza - Flu Vaccine QUAD 36+ mos PF IM (Fluarix Quad PF)      Patient advised to return to clinic immediately if symptoms worsen or persist or new concerns.  Patient Instructions  BEFORE YOU LEAVE: -flu shot (pneumococcal 23 and Tdap if he is ok with it) -labs -neck spasm exercises -1 month follow up for headaches and blood pressure  Schedule eye exam yearly  Exercises for neck and leg  -We have ordered labs or studies at this visit. It can take up to 1-2 weeks for results and processing. We will contact you with instructions IF your results are abnormal. Normal results will be released to your [facility]. If you have not heard from [place] or can not find your results in [facility] in  2 weeks please contact our office.   We recommend the following healthy lifestyle measures: - eat a healthy diet consisting of lots of vegetables, fruits, beans, nuts, seeds, healthy meats such as white chicken and fish and whole grains.  - avoid fried foods, fast food, processed foods, sodas, red meet and other fattening foods.  - get a least 150 minutes of aerobic exercise per week.       Return in about 1 month (around 10/03/2014) for BP/headache.   Colin Benton R.

## 2014-09-02 NOTE — Patient Instructions (Addendum)
BEFORE YOU LEAVE: -flu shot (pneumococcal 23 and Tdap if he is ok with it) -labs -neck spasm exercises -1 month follow up for headaches and blood pressure  Schedule eye exam yearly  Exercises for neck and leg  -We have ordered labs or studies at this visit. It can take up to 1-2 weeks for results and processing. We will contact you with instructions IF your results are abnormal. Normal results will be released to your [facility]. If you have not heard from [place] or can not find your results in [facility] in 2 weeks please contact our office.   We recommend the following healthy lifestyle measures: - eat a healthy diet consisting of lots of vegetables, fruits, beans, nuts, seeds, healthy meats such as white chicken and fish and whole grains.  - avoid fried foods, fast food, processed foods, sodas, red meet and other fattening foods.  - get a least 150 minutes of aerobic exercise per week.

## 2014-09-02 NOTE — Progress Notes (Signed)
Pre visit review using our clinic review tool, if applicable. No additional management support is needed unless otherwise documented below in the visit note. 

## 2014-09-05 ENCOUNTER — Telehealth: Payer: Self-pay | Admitting: Family Medicine

## 2014-09-05 NOTE — Telephone Encounter (Signed)
emmi emailed °

## 2014-09-06 ENCOUNTER — Telehealth: Payer: Self-pay | Admitting: Family Medicine

## 2014-09-06 NOTE — Telephone Encounter (Signed)
emmi emailed °

## 2014-09-19 ENCOUNTER — Telehealth: Payer: Self-pay | Admitting: Family Medicine

## 2014-09-19 MED ORDER — LOSARTAN POTASSIUM 50 MG PO TABS: 50.0000 mg | ORAL_TABLET | Freq: Every day | ORAL | Status: DC

## 2014-09-19 NOTE — Telephone Encounter (Signed)
I am concerned that the BP medication is not causing his symptoms, as when we saw him he had been taking the medication and was not having these symptoms. The old medication was not treating his BP effectively so will need a change in medication. Advise appt to evaluate, try to figure out what is causing his symptoms and change medication if needed.

## 2014-09-19 NOTE — Telephone Encounter (Signed)
Advise appt

## 2014-09-19 NOTE — Telephone Encounter (Signed)
Dr Maudie Mercury could you please call the pt?

## 2014-09-19 NOTE — Telephone Encounter (Signed)
I left a message at the pts cell number to return my call. 

## 2014-09-19 NOTE — Telephone Encounter (Signed)
Pt was seen on 09-02-14. Pt stated losartan-hctz 50-12.5mg  is causing side effects headaches and knee pain. Pt would like to go back to old bp med. Pt does not remember the name. [facility] [address]

## 2014-09-19 NOTE — Telephone Encounter (Signed)
[phone] (home)  Discussed. He is convinced after reading side effects for hydrochlorthiazide that his knee pains are related to this. He wants to stop and take losartan alone. Rx changed. Advised follow up in 2-4 weeks to recheck BP and he agrees to schedule this.

## 2014-09-19 NOTE — Telephone Encounter (Signed)
I informed the pt he needs an appt and he stated he cannot come in anytime soon, needs medication and asked why does he need to come back in as he was just here for an appt.  Message forwarded to Dr Maudie Mercury.

## 2014-09-30 ENCOUNTER — Ambulatory Visit: Payer: 59 | Admitting: Family Medicine

## 2015-01-21 ENCOUNTER — Other Ambulatory Visit: Payer: Self-pay | Admitting: Family Medicine

## 2015-02-24 ENCOUNTER — Other Ambulatory Visit: Payer: Self-pay | Admitting: Family Medicine

## 2015-02-27 ENCOUNTER — Ambulatory Visit: Payer: Self-pay | Admitting: Family Medicine

## 2015-03-27 ENCOUNTER — Other Ambulatory Visit: Payer: Self-pay | Admitting: Family Medicine

## 2015-03-28 ENCOUNTER — Encounter: Payer: Self-pay | Admitting: Family Medicine

## 2015-03-28 ENCOUNTER — Ambulatory Visit (INDEPENDENT_AMBULATORY_CARE_PROVIDER_SITE_OTHER): Payer: 59 | Admitting: Family Medicine

## 2015-03-28 VITALS — BP 145/98 | HR 81 | Temp 98.7°F | Ht 71.0 in | Wt 215.4 lb

## 2015-03-28 DIAGNOSIS — E785 Hyperlipidemia, unspecified: Secondary | ICD-10-CM | POA: Diagnosis not present

## 2015-03-28 DIAGNOSIS — Z23 Encounter for immunization: Secondary | ICD-10-CM

## 2015-03-28 DIAGNOSIS — Z720 Tobacco use: Secondary | ICD-10-CM

## 2015-03-28 DIAGNOSIS — I10 Essential (primary) hypertension: Secondary | ICD-10-CM

## 2015-03-28 DIAGNOSIS — E119 Type 2 diabetes mellitus without complications: Secondary | ICD-10-CM | POA: Diagnosis not present

## 2015-03-28 DIAGNOSIS — F17200 Nicotine dependence, unspecified, uncomplicated: Secondary | ICD-10-CM

## 2015-03-28 HISTORY — DX: Nicotine dependence, unspecified, uncomplicated: F17.200

## 2015-03-28 MED ORDER — AMLODIPINE BESYLATE 5 MG PO TABS: 5.0000 mg | ORAL_TABLET | Freq: Every day | ORAL | Status: DC

## 2015-03-28 MED ORDER — LOSARTAN POTASSIUM 50 MG PO TABS: 75.0000 mg | ORAL_TABLET | Freq: Every day | ORAL | Status: DC

## 2015-03-28 NOTE — Patient Instructions (Addendum)
BEFORE YOU LEAVE: -labs -follow up in 2-4 weeks -Tdap  -Start the Norvasc 5 mg  -Increase losartan to 1.5 tablets daily  -quit smoking  We recommend the following healthy lifestyle measures: - eat a healthy diet consisting of lots of vegetables, fruits, beans, nuts, seeds, healthy meats such as white chicken and fish and whole grains.  - avoid fried foods, starches, sweets, fast food, processed foods, sodas, red meet and other fattening foods.  - get a least 150 minutes of aerobic exercise per week.

## 2015-03-28 NOTE — Progress Notes (Signed)
Pre visit review using our clinic review tool, if applicable. No additional management support is needed unless otherwise documented below in the visit note. 

## 2015-03-28 NOTE — Progress Notes (Signed)
HPI:  HM: -optho  HTN: -added hctz 07/2014 - in 09/2014 he insisted on stopping this due to knee pain, advised 2 week follow up - he did not follow up, advised 1 mo f/u after last visit - he did not follow this recommendation either -denies: CP, SOB, HA (other then below), vision changes  -meds: norvasc 10mg  daily - he stopped taking, losartan 50mg  daily   HLD:  -wanted to do lifestyle interventions  -reports:no regular exercise - but is active; diet is better he has lost a little wieght  Hx of diabetes:  -per his report on medications in the past - none now -his last several hemoglobin a1cs have been good -has not done diabetic eye exam in last one year -denies: polyuria, polydipsia, vision changes  Tobacco use: -1 pack per week, has smoked for 40 years but never much - 5-10 cigs per day -denies cough, hemoptysis   ROS: See pertinent positives and negatives per HPI.  Past Medical History  Diagnosis Date  . Hypertension   . Diabetes mellitus without complication   . Hyperlipidemia   . Gout 04/16/2013  . DDD (degenerative disc disease), lumbar     per his report  . Tobacco use disorder 03/28/2015    Past Surgical History  Procedure Laterality Date  . Tonsillectomy    . Colonoscopy      Family History  Problem Relation Age of Onset  . Hypertension Father   . Diabetes Father   . Stroke Father     History   Social History  . Marital Status: Married    Spouse Name: N/A  . Number of Children: N/A  . Years of Education: N/A   Social History Main Topics  . Smoking status: Current Every Day Smoker    Types: Cigarettes  . Smokeless tobacco: Not on file     Comment: per patient 5 cigarettes a day   . Alcohol Use: Yes     Comment: occasional  . Drug Use: No  . Sexual Activity: Not on file   Other Topics Concern  . 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  losartan (COZAAR) 50 MG tablet, Take 1.5 tablets (75 mg total) by mouth daily., Disp: 45 tablet, Rfl: 5 .  amLODipine (NORVASC) 5 MG tablet, Take 1 tablet (5 mg total) by mouth daily., Disp: 90 tablet, Rfl: 3  EXAM:  Filed Vitals:   03/28/15 1606  BP: 145/98  Pulse: 81  Temp: 98.7 F (37.1 C)    Body mass index is 30.06 kg/(m^2).  GENERAL: vitals reviewed and listed above, alert, oriented, appears well hydrated and in no acute distress  HEENT: atraumatic, conjunttiva clear, no obvious abnormalities on inspection of external nose and ears  NECK: no obvious masses on inspection  LUNGS: clear to auscultation bilaterally, no wheezes, rales or rhonchi, good air movement  CV: HRRR, no peripheral edema  MS: moves all extremities without noticeable abnormality  PSYCH: pleasant and cooperative, no obvious depression or anxiety  ASSESSMENT AND PLAN:  Discussed the following assessment and plan:  Tobacco use disorder -advised to quit, does not have symptoms, does not meet criteria for lung cancer screening <  30 pack year hx  Diet-controlled type 2 diabetes mellitus - Plan: Hemoglobin A1c -lifestyle recs  Essential hypertension, benign - Plan: Basic metabolic panel, amLODipine (NORVASC) 5 MG tablet -poor compliance with recs or follow up - advised of sig risks with untreated HTN including stroke and heart disease, advised he work with [place] in terms of following recs and following up to find a regimen he tolerates - her is reluctant to take medications, but agreed after discussion risks/benefits -restart norvasc, increase losartan to 1.5 tablets daily  Hyperlipemia -lifestyle recs, not fasting, check fasting lipids next visit  Need for Tdap vaccination - Plan: Tdap vaccine greater than or equal to 7yo IM  -Patient advised to return or notify a doctor  immediately if symptoms worsen or persist or new concerns arise.  Patient Instructions  BEFORE YOU LEAVE: -labs -follow up in 2-4 weeks -Tdap  -Start the Norvasc 5 mg  -Increase losartan to 1.5 tablets daily  -quit smoking  We recommend the following healthy lifestyle measures: - eat a healthy diet consisting of lots of vegetables, fruits, beans, nuts, seeds, healthy meats such as white chicken and fish and whole grains.  - avoid fried foods, starches, sweets, fast food, processed foods, sodas, red meet and other fattening foods.  - get a least 150 minutes of aerobic exercise per week.       Colin Benton R.

## 2015-03-29 LAB — BASIC METABOLIC PANEL
BUN: 19 mg/dL (ref 6–23)
CALCIUM: 9.3 mg/dL (ref 8.4–10.5)
CO2: 31 mEq/L (ref 19–32)
Chloride: 104 mEq/L (ref 96–112)
Creatinine, Ser: 1.15 mg/dL (ref 0.40–1.50)
GFR: 84.83 mL/min (ref >60.00)
GLUCOSE: 84 mg/dL (ref 70–99)
Potassium: 3.7 mEq/L (ref 3.5–5.1)
Sodium: 141 mEq/L (ref 135–145)

## 2015-03-29 LAB — HEMOGLOBIN A1C: Hgb A1c MFr Bld: 5.7 % (ref 4.6–6.5)

## 2015-04-25 ENCOUNTER — Encounter: Payer: Self-pay | Admitting: Family Medicine

## 2015-04-25 ENCOUNTER — Ambulatory Visit (INDEPENDENT_AMBULATORY_CARE_PROVIDER_SITE_OTHER): Payer: 59 | Admitting: Family Medicine

## 2015-04-25 VITALS — BP 140/92 | HR 77 | Temp 98.8°F | Ht 71.0 in | Wt 214.2 lb

## 2015-04-25 DIAGNOSIS — Z720 Tobacco use: Secondary | ICD-10-CM

## 2015-04-25 DIAGNOSIS — F17200 Nicotine dependence, unspecified, uncomplicated: Secondary | ICD-10-CM

## 2015-04-25 DIAGNOSIS — I10 Essential (primary) hypertension: Secondary | ICD-10-CM | POA: Diagnosis not present

## 2015-04-25 DIAGNOSIS — E785 Hyperlipidemia, unspecified: Secondary | ICD-10-CM | POA: Diagnosis not present

## 2015-04-25 DIAGNOSIS — E119 Type 2 diabetes mellitus without complications: Secondary | ICD-10-CM

## 2015-04-25 NOTE — Progress Notes (Signed)
Pre visit review using our clinic review tool, if applicable. No additional management support is needed unless otherwise documented below in the visit note. 

## 2015-04-25 NOTE — Progress Notes (Signed)
HPI:  HTN: -added hctz 07/2014 - in 09/2014 he insisted on stopping this due to knee pain -started norvasc 5mg  and increased losartan 5/17 to 75mg  - but he did not increase the losartan -denies: CP, SOB, HA (other then below), vision changes   HLD:  -wanted to do lifestyle interventions  -reports:no regular exercise - but is active; diet is better he has lost a little wieght  Hx of diabetes:  -per his report on medications in the past - none now -his last several hemoglobin a1cs have been good -has not done diabetic eye exam in last one year -denies: polyuria, polydipsia, vision changes  Tobacco use: -2 packs per week, has smoked for 40 years but never  -denies cough, hemoptysis -he declines help he feels he can stop   ROS: See pertinent positives and negatives per HPI.  Past Medical History  Diagnosis Date  . Hypertension   . Diabetes mellitus without complication   . Hyperlipidemia   . Gout 04/16/2013  . DDD (degenerative disc disease), lumbar     per his report  . Tobacco use disorder 03/28/2015    Past Surgical History  Procedure Laterality Date  . Tonsillectomy    . Colonoscopy      Family History  Problem Relation Age of Onset  . Hypertension Father   . Diabetes Father   . Stroke Father     History   Social History  . Marital Status: Married    Spouse Name: N/A  . Number of Children: N/A  . Years of Education: N/A   Social History Main Topics  . Smoking status: Current Every Day Smoker    Types: Cigarettes  . Smokeless tobacco: Not on file     Comment: per patient 5 cigarettes a day   . Alcohol Use: Yes     Comment: occasional  . Drug Use: No  . Sexual Activity: Not on file   Other Topics Concern  . 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  amLODipine (NORVASC) 5 MG tablet, Take 1 tablet (5 mg total) by mouth daily., Disp: 90 tablet, Rfl: 3 .  losartan (COZAAR) 50 MG tablet, Take 1.5 tablets (75 mg total) by mouth daily., Disp: 45 tablet, Rfl: 5  EXAM:  Filed Vitals:   04/25/15 1524  BP: 140/92  Pulse: 77  Temp: 98.8 F (37.1 C)    Body mass index is 29.89 kg/(m^2).  GENERAL: vitals reviewed and listed above, alert, oriented, appears well hydrated and in no acute distress  HEENT: atraumatic, conjunttiva clear, no obvious abnormalities on inspection of external nose and ears  NECK: no obvious masses on inspection  LUNGS: clear to auscultation bilaterally, no wheezes, rales or rhonchi, good air movement  CV: HRRR, no peripheral edema  MS: moves all extremities without noticeable abnormality  PSYCH: pleasant and cooperative, no obvious depression or anxiety  ASSESSMENT AND PLAN:  Discussed the following assessment and plan:  Essential hypertension, benign  Hyperlipemia  Diet-controlled type 2 diabetes mellitus  Tobacco use disorder  -increase losartan -advised to quit smoking and offered help -follow up in 1-2 months -Patient advised to return or notify a doctor immediately if  symptoms worsen or persist or new concerns arise.  Patient Instructions  BEFORE YOU LEAVE: -follow up in 3 months  Increase the losartan to 1.5 tablets daily  Schedule your diabetic eye exam  Please quit smoking  We recommend the following healthy lifestyle measures: - eat a healthy diet consisting of lots of vegetables, fruits, beans, nuts, seeds, healthy meats such as white chicken and fish  - avoid starches, sweets, fried foods, fast food, processed foods, sodas, red meet and other fattening foods.  - get a least 150 minutes of aerobic exercise per week.       Colin Benton R.

## 2015-04-25 NOTE — Patient Instructions (Addendum)
BEFORE YOU LEAVE: -follow up in 3 months  Increase the losartan to 1.5 tablets daily  Schedule your diabetic eye exam  Please quit smoking  We recommend the following healthy lifestyle measures: - eat a healthy diet consisting of lots of vegetables, fruits, beans, nuts, seeds, healthy meats such as white chicken and fish  - avoid starches, sweets, fried foods, fast food, processed foods, sodas, red meet and other fattening foods.  - get a least 150 minutes of aerobic exercise per week.

## 2015-07-27 ENCOUNTER — Ambulatory Visit (INDEPENDENT_AMBULATORY_CARE_PROVIDER_SITE_OTHER): Payer: 59 | Admitting: Family Medicine

## 2015-07-27 ENCOUNTER — Encounter: Payer: Self-pay | Admitting: Family Medicine

## 2015-07-27 VITALS — HR 76 | Temp 98.7°F | Ht 71.0 in | Wt 216.5 lb

## 2015-07-27 DIAGNOSIS — I10 Essential (primary) hypertension: Secondary | ICD-10-CM

## 2015-07-27 DIAGNOSIS — E119 Type 2 diabetes mellitus without complications: Secondary | ICD-10-CM

## 2015-07-27 DIAGNOSIS — Z720 Tobacco use: Secondary | ICD-10-CM | POA: Diagnosis not present

## 2015-07-27 DIAGNOSIS — E785 Hyperlipidemia, unspecified: Secondary | ICD-10-CM

## 2015-07-27 DIAGNOSIS — F17200 Nicotine dependence, unspecified, uncomplicated: Secondary | ICD-10-CM

## 2015-07-27 MED ORDER — LOSARTAN POTASSIUM 50 MG PO TABS: 75.0000 mg | ORAL_TABLET | Freq: Every day | ORAL | Status: DC

## 2015-07-27 NOTE — Patient Instructions (Signed)
BEFORE YOU LEAVE: -schedule follow up in 2  Weeks - come fasting  TAKE LOSARTAN 75mg  daily and NORVASC 5 mg daily

## 2015-07-27 NOTE — Progress Notes (Signed)
Pre visit review using our clinic review tool, if applicable. No additional management support is needed unless otherwise documented below in the visit note. 

## 2015-07-27 NOTE — Progress Notes (Signed)
HPI:  HTN: -added hctz 07/2014 - in 09/2014 he insisted on stopping this due to knee pain -on norvasc and increase losartan to 75mg , but he reports he ran out of the losartan and has not taken in 1 month -denies: CP, SOB, HA (other then below), vision changes   HLD:  -wanted to do lifestyle interventions  -reports:no regular exercise - but is active; diet is better   Hx of diabetes:  -per his report on medications in the past - none now -his last several hemoglobin a1cs have been good -has not done diabetic eye exam in last one year -denies: polyuria, polydipsia, vision changes  Tobacco use: -2 packs per week, has smoked for 40 years but never  -denies cough, hemoptysis -he declines help he feels he can stop  ROS: See pertinent positives and negatives per HPI.  Past Medical History  Diagnosis Date  . Hypertension   . Diabetes mellitus without complication   . Hyperlipidemia   . Gout 04/16/2013  . DDD (degenerative disc disease), lumbar     per his report  . Tobacco use disorder 03/28/2015    Past Surgical History  Procedure Laterality Date  . Tonsillectomy    . Colonoscopy      Family History  Problem Relation Age of Onset  . Hypertension Father   . Diabetes Father   . Stroke Father     Social History   Social History  . Marital Status: Married    Spouse Name: N/A  . Number of Children: N/A  . Years of Education: N/A   Social History Main Topics  . Smoking status: Current Every Day Smoker    Types: Cigarettes  . Smokeless tobacco: None     Comment: per patient 5 cigarettes a day   . Alcohol Use: Yes     Comment: occasional  . Drug Use: No  . Sexual Activity: Not Asked   Other Topics Concern  . 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  amLODipine (NORVASC) 5 MG tablet, Take 1 tablet (5 mg total) by mouth daily., Disp: 90 tablet, Rfl: 3 .  losartan (COZAAR) 50 MG tablet, Take 1.5 tablets (75 mg total) by mouth daily., Disp: 135 tablet, Rfl: 3  EXAM:  Filed Vitals:   07/27/15 1540  Pulse: 76  Temp: 98.7 F (37.1 C)    Body mass index is 30.21 kg/(m^2).  GENERAL: vitals reviewed and listed above, alert, oriented, appears well hydrated and in no acute distress  HEENT: atraumatic, conjunttiva clear, no obvious abnormalities on inspection of external nose and ears  NECK: no obvious masses on inspection  LUNGS: clear to auscultation bilaterally, no wheezes, rales or rhonchi, good air movement  CV: HRRR, no peripheral edema  MS: moves all extremities without noticeable abnormality  PSYCH: pleasant and cooperative, no obvious depression or anxiety  ASSESSMENT AND PLAN:  Discussed the following assessment and plan:  Essential hypertension, benign -stressed importance of taking medication, restart meds, follow up 2 weeks for recheck and labs  Hyperlipemia -lifestyle recs, labs next visit  Diet-controlled type 2 diabetes mellitus -lifestyle recs, foot exam next visit, advised optho exam  Tobacco use disorder -advised  to quit, offered help, counseled 5 minutes  -Patient advised to return or notify a doctor immediately if symptoms worsen or persist or new concerns arise.  Patient Instructions  BEFORE YOU LEAVE: -schedule follow up in 2  Weeks - come fasting  TAKE LOSARTAN 75mg  daily and NORVASC 5 mg daily       Lotus Gover R.

## 2015-08-10 ENCOUNTER — Ambulatory Visit (INDEPENDENT_AMBULATORY_CARE_PROVIDER_SITE_OTHER): Payer: 59 | Admitting: Family Medicine

## 2015-08-10 ENCOUNTER — Encounter: Payer: Self-pay | Admitting: Family Medicine

## 2015-08-10 VITALS — BP 152/102 | HR 75 | Temp 98.8°F | Ht 71.0 in | Wt 212.9 lb

## 2015-08-10 DIAGNOSIS — E119 Type 2 diabetes mellitus without complications: Secondary | ICD-10-CM

## 2015-08-10 DIAGNOSIS — F17200 Nicotine dependence, unspecified, uncomplicated: Secondary | ICD-10-CM

## 2015-08-10 DIAGNOSIS — Z720 Tobacco use: Secondary | ICD-10-CM | POA: Diagnosis not present

## 2015-08-10 DIAGNOSIS — E785 Hyperlipidemia, unspecified: Secondary | ICD-10-CM

## 2015-08-10 DIAGNOSIS — I10 Essential (primary) hypertension: Secondary | ICD-10-CM | POA: Diagnosis not present

## 2015-08-10 LAB — BASIC METABOLIC PANEL
BUN: 15 mg/dL (ref 6–23)
CO2: 31 meq/L (ref 19–32)
Calcium: 8.9 mg/dL (ref 8.4–10.5)
Chloride: 104 mEq/L (ref 96–112)
Creatinine, Ser: 0.96 mg/dL (ref 0.40–1.50)
GFR: 104.35 mL/min (ref >60.00)
GLUCOSE: 95 mg/dL (ref 70–99)
POTASSIUM: 3.5 meq/L (ref 3.5–5.1)
SODIUM: 143 meq/L (ref 135–145)

## 2015-08-10 LAB — LIPID PANEL
CHOL/HDL RATIO: 3
Cholesterol: 213 mg/dL — ABNORMAL HIGH (ref 0–200)
HDL: 77.5 mg/dL (ref >39.00)
LDL Cholesterol: 127 mg/dL — ABNORMAL HIGH (ref 0–99)
NONHDL: 135.6
Triglycerides: 43 mg/dL (ref 0.0–149.0)
VLDL: 8.6 mg/dL (ref 0.0–40.0)

## 2015-08-10 LAB — HEMOGLOBIN A1C: Hgb A1c MFr Bld: 5.7 % (ref 4.6–6.5)

## 2015-08-10 MED ORDER — LOSARTAN POTASSIUM-HCTZ 100-25 MG PO TABS: 1.0000 | ORAL_TABLET | Freq: Every day | ORAL | Status: DC

## 2015-08-10 NOTE — Progress Notes (Signed)
Pre visit review using our clinic review tool, if applicable. No additional management support is needed unless otherwise documented below in the visit note. 

## 2015-08-10 NOTE — Patient Instructions (Signed)
BEFORE YOU LEAVE: -labs -nurse visit in 2-4 weeks to recheck blood pressure -follow up in 3 months  PICK up new blood pressure medication today (LOSARTAN-HCTZ) - this is a combination of the losartan and another medication. STOP the losartan that you are currently taking CONTINUE the NORVASC (amlodiine)  Call to schedule your annual diabetic eye exam with your eye doctor  Stop smoking  We recommend the following healthy lifestyle measures: - eat a healthy whole foods diet consisting of regular small meals composed of vegetables, fruits, beans, nuts, seeds, healthy meats such as white chicken and fish and whole grains.  - avoid sweets, white starchy foods, fried foods, fast food, processed foods, sodas, red meet and other fattening foods.  - get a least 150-300 minutes of aerobic exercise per week.   -We have ordered labs or studies at this visit. It can take up to 1-2 weeks for results and processing. We will contact you with instructions IF your results are abnormal. Normal results will be released to your [facility]. If you have not heard from [place] or can not find your results in [facility] in 2 weeks please contact our office.

## 2015-08-10 NOTE — Progress Notes (Signed)
HPI:  HTN: -poor compliance -added hctz 07/2014 - in 09/2014 he insisted on stopping this due to knee pain -on norvasc and increased losartan to 75mg , but he had not been taking either at last visit, reports did take this week -denies: CP, SOB, HA (other then below), vision changes   HLD:  -wanted to do lifestyle interventions  -reports:no regular exercise - but is active; diet is better   Hx of diabetes:  -per his report on medications in the past - none now -his last several hemoglobin a1cs have been good -has not done diabetic eye exam in last one year -denies: polyuria, polydipsia, vision changes  Tobacco use: -2 packs per week, has smoked for 30 years about 1/4 ppd -denies cough, hemoptysis -he declines help he feels he can stop   ROS: See pertinent positives and negatives per HPI.  Past Medical History  Diagnosis Date  . Hypertension   . Diabetes mellitus without complication   . Hyperlipidemia   . Gout 04/16/2013  . DDD (degenerative disc disease), lumbar     per his report  . Tobacco use disorder 03/28/2015    Past Surgical History  Procedure Laterality Date  . Tonsillectomy    . Colonoscopy      Family History  Problem Relation Age of Onset  . Hypertension Father   . Diabetes Father   . Stroke Father     Social History   Social History  . Marital Status: Married    Spouse Name: N/A  . Number of Children: N/A  . Years of Education: N/A   Social History Main Topics  . Smoking status: Current Every Day Smoker    Types: Cigarettes  . Smokeless tobacco: None     Comment: per patient 5 cigarettes a day   . Alcohol Use: Yes     Comment: occasional  . Drug Use: No  . Sexual Activity: Not Asked   Other Topics Concern  . 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  amLODipine (NORVASC) 5 MG tablet, Take 1 tablet (5 mg total) by mouth daily., Disp: 90 tablet, Rfl: 3 .  losartan-hydrochlorothiazide (HYZAAR) 100-25 MG tablet, Take 1 tablet by mouth daily., Disp: 90 tablet, Rfl: 0  EXAM:  Filed Vitals:   08/10/15 0916  BP: 152/102  Pulse: 75  Temp: 98.8 F (37.1 C)    Body mass index is 29.71 kg/(m^2).  GENERAL: vitals reviewed and listed above, alert, oriented, appears well hydrated and in no acute distress  HEENT: atraumatic, conjunttiva clear, no obvious abnormalities on inspection of external nose and ears  NECK: no obvious masses on inspection  LUNGS: clear to auscultation bilaterally, no wheezes, rales or rhonchi, good air movement  CV: HRRR, no peripheral edema  MS: moves all extremities without noticeable abnormality  PSYCH: pleasant and cooperative, no obvious depression or anxiety  ASSESSMENT AND PLAN:  Discussed the following assessment and plan:  Essential hypertension, benign - Plan: Basic metabolic panel  Hyperlipemia - Plan: Lipid Panel  Diet-controlled type 2 diabetes mellitus - Plan: Hemoglobin A1c  Tobacco use disorder  -discussed options for BP and he opted to add back diuretic as did not  seem to make a difference with his knee pain -FASTING labs -advised to quit smoking -lifestyle recs -foot exam -advised annual diabetic eye exam -Patient advised to return or notify a doctor immediately if symptoms worsen or persist or new concerns arise.  Patient Instructions  BEFORE YOU LEAVE: -labs -nurse visit in 2-4 weeks to recheck blood pressure -follow up in 3 months  PICK up new blood pressure medication today (LOSARTAN-HCTZ) - this is a combination of the losartan and another medication. STOP the losartan that you are currently taking CONTINUE the NORVASC (amlodiine)  Call to schedule your annual diabetic eye exam with your eye doctor  Stop smoking  We  recommend the following healthy lifestyle measures: - eat a healthy whole foods diet consisting of regular small meals composed of vegetables, fruits, beans, nuts, seeds, healthy meats such as white chicken and fish and whole grains.  - avoid sweets, white starchy foods, fried foods, fast food, processed foods, sodas, red meet and other fattening foods.  - get a least 150-300 minutes of aerobic exercise per week.   -We have ordered labs or studies at this visit. It can take up to 1-2 weeks for results and processing. We will contact you with instructions IF your results are abnormal. Normal results will be released to your [facility]. If you have not heard from [place] or can not find your results in [facility] in 2 weeks please contact our office.              Samuel Benton R.

## 2015-09-08 ENCOUNTER — Encounter: Payer: Self-pay | Admitting: *Deleted

## 2015-09-08 ENCOUNTER — Ambulatory Visit (INDEPENDENT_AMBULATORY_CARE_PROVIDER_SITE_OTHER): Payer: 59 | Admitting: *Deleted

## 2015-09-08 VITALS — BP 144/98 | HR 85

## 2015-09-08 DIAGNOSIS — I10 Essential (primary) hypertension: Secondary | ICD-10-CM | POA: Diagnosis not present

## 2015-09-08 NOTE — Progress Notes (Signed)
Nurse BP check, told to wait for me to discuss with him or follow up - he opted to schedule follow up appt.

## 2015-09-15 ENCOUNTER — Ambulatory Visit: Payer: 59 | Admitting: Family Medicine

## 2015-09-28 ENCOUNTER — Ambulatory Visit: Payer: 59 | Admitting: Family Medicine

## 2015-09-28 DIAGNOSIS — Z0289 Encounter for other administrative examinations: Secondary | ICD-10-CM

## 2015-10-25 ENCOUNTER — Other Ambulatory Visit: Payer: Self-pay | Admitting: Family Medicine

## 2015-11-09 ENCOUNTER — Encounter: Payer: Self-pay | Admitting: Family Medicine

## 2015-11-09 ENCOUNTER — Ambulatory Visit (INDEPENDENT_AMBULATORY_CARE_PROVIDER_SITE_OTHER): Payer: 59 | Admitting: Family Medicine

## 2015-11-09 VITALS — BP 140/100 | HR 109 | Temp 98.6°F | Ht 71.0 in | Wt 219.0 lb

## 2015-11-09 DIAGNOSIS — I10 Essential (primary) hypertension: Secondary | ICD-10-CM | POA: Diagnosis not present

## 2015-11-09 DIAGNOSIS — M542 Cervicalgia: Secondary | ICD-10-CM

## 2015-11-09 MED ORDER — AMLODIPINE BESYLATE 10 MG PO TABS: 10.0000 mg | ORAL_TABLET | Freq: Every day | ORAL | Status: DC

## 2015-11-09 MED ORDER — LOSARTAN POTASSIUM-HCTZ 100-25 MG PO TABS: 1.0000 | ORAL_TABLET | Freq: Every day | ORAL | Status: DC

## 2015-11-09 NOTE — Patient Instructions (Signed)
BEFORE YOU LEAVE: -xray sheet -neck spasm exercises -follow up appointment in 1 month  Increase norvasc (amlodipine) to 10 mg daily  Cut back on salt and alcohol  We recommend the following healthy lifestyle measures: - eat a healthy whole foods diet consisting of regular small meals composed of vegetables, fruits, beans, nuts, seeds, healthy meats such as white chicken and fish and whole grains.  - avoid sweets, white starchy foods, fried foods, fast food, processed foods, sodas, red meet and other fattening foods.  - get a least 150-300 minutes of aerobic exercise per week.

## 2015-11-09 NOTE — Progress Notes (Signed)
HPI:  Follow up:  HTN: -reports ran out of BP med today - tells me he took all of his medications this morning and all week -denies: CP, SOB, swelling, HA, vision changes  Neck pain: -> 10 years, intermittent, saw specialist in the past -occ radiation to R arm -no weakness, numbness, fevers, malaise, nexk stiffness, ha -reports has DDD and saw specialist in the past but did not want to do surgery  ROS: See pertinent positives and negatives per HPI.  Past Medical History  Diagnosis Date  . Hypertension   . Diabetes mellitus without complication ([place])   . Hyperlipidemia   . Gout 04/16/2013  . DDD (degenerative disc disease), lumbar     per his report  . Tobacco use disorder 03/28/2015    Past Surgical History  Procedure Laterality Date  . Tonsillectomy    . Colonoscopy      Family History  Problem Relation Age of Onset  . Hypertension Father   . Diabetes Father   . Stroke Father     Social History   Social History  . Marital Status: Married    Spouse Name: N/A  . Number of Children: N/A  . Years of Education: N/A   Social History Main Topics  . Smoking status: Current Every Day Smoker    Types: Cigarettes  . Smokeless tobacco: None     Comment: per patient 5 cigarettes a day   . Alcohol Use: Yes     Comment: occasional  . Drug Use: No  . Sexual Activity: Not Asked   Other Topics Concern  . 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  amLODipine (NORVASC) 10 MG tablet, Take 1 tablet (10 mg total) by mouth daily., Disp: 90 tablet, Rfl: 3 .  losartan-hydrochlorothiazide (HYZAAR) 100-25 MG tablet, Take 1 tablet by mouth daily., Disp: 90 tablet, Rfl: 3  EXAM:  Filed Vitals:   11/09/15 1536 11/09/15 1607  BP: 150/100 140/100  Pulse:  109   Temp: 98.6 F (37 C)     Body mass index is 30.56 kg/(m^2).  GENERAL: vitals reviewed and listed above, alert, oriented, appears well hydrated and in no acute distress  HEENT: atraumatic, conjunttiva clear, no obvious abnormalities on inspection of external nose and ears  NECK: no obvious masses on inspection  LUNGS: clear to auscultation bilaterally, no wheezes, rales or rhonchi, good air movement  CV: HRRR, no peripheral edema  MS: moves all extremities without noticeable abnormality, normal ROM of head and neck, no meningeal signs, neg spurling, normal strength, sensitivity to light touch and dtrs in UEs bilat, R trapezius and R paracervical muscle TTP  PSYCH: pleasant and cooperative, no obvious depression or anxiety  ASSESSMENT AND PLAN:  Discussed the following assessment and plan:  Essential hypertension, benign - Plan: amLODipine (NORVASC) 10 MG tablet -increase norvasc. Nephrology referral if not controlled at next visit.  Neck pain - Plan: DG Cervical Spine Complete -he does not want to do oral steroids, pain is mild to mod and he opted for OTC analgesics and HEP - plain films and may ne MRI and/or referal pending results and if symptoms persist.  -Patient advised to return or  notify a doctor immediately if symptoms worsen or persist or new concerns arise.  Patient Instructions  BEFORE YOU LEAVE: -xray sheet -neck spasm exercises -follow up appointment in 1 month  Increase norvasc (amlodipine) to 10 mg daily  Cut back on salt and alcohol  We recommend the following healthy lifestyle measures: - eat a healthy whole foods diet consisting of regular small meals composed of vegetables, fruits, beans, nuts, seeds, healthy meats such as white chicken and fish and whole grains.  - avoid sweets, white starchy foods, fried foods, fast food, processed foods, sodas, red meet and other fattening foods.  - get a least 150-300 minutes of aerobic exercise per week.        Colin Benton R.

## 2015-11-09 NOTE — Progress Notes (Signed)
Pre visit review using our clinic review tool, if applicable. No additional management support is needed unless otherwise documented below in the visit note. 

## 2015-12-12 ENCOUNTER — Encounter: Payer: Self-pay | Admitting: Family Medicine

## 2015-12-12 ENCOUNTER — Ambulatory Visit (INDEPENDENT_AMBULATORY_CARE_PROVIDER_SITE_OTHER): Payer: 59 | Admitting: Family Medicine

## 2015-12-12 VITALS — BP 122/90 | HR 97 | Temp 99.1°F | Ht 71.0 in | Wt 218.3 lb

## 2015-12-12 DIAGNOSIS — E785 Hyperlipidemia, unspecified: Secondary | ICD-10-CM | POA: Diagnosis not present

## 2015-12-12 DIAGNOSIS — E119 Type 2 diabetes mellitus without complications: Secondary | ICD-10-CM

## 2015-12-12 DIAGNOSIS — F17200 Nicotine dependence, unspecified, uncomplicated: Secondary | ICD-10-CM

## 2015-12-12 DIAGNOSIS — I10 Essential (primary) hypertension: Secondary | ICD-10-CM | POA: Diagnosis not present

## 2015-12-12 NOTE — Patient Instructions (Addendum)
BEFORE YOU LEAVE: -Schedule Physical Exam in 4 months  Schedule your diabetic eye exam  Please quit smoking - try nicotine gum or lozenge instead of smoking  Continue current medications  We recommend the following healthy lifestyle measures: - eat a healthy whole foods diet consisting of regular small meals composed of vegetables, fruits, beans, nuts, seeds, healthy meats such as white chicken and fish and whole grains.  - avoid sweets, white starchy foods, fried foods, fast food, processed foods, sodas, red meet and other fattening foods.  - get a least 150-300 minutes of aerobic exercise per week.

## 2015-12-12 NOTE — Progress Notes (Signed)
HPI:  Follow up:  HTN: -meds: triple therapy with losartan-hctz 100-25, norvasc increased to 10mg  last visit -some issues with compliance in the past -reports:dong well -denies:cp, sob, doe, swelling  Neck pain  -reports resolved  HLD:  -wanted to do lifestyle interventions and improved with ratio 3 on recent check -reports:no regular exercise - but is active; diet is better  Hx of diabetes:  -per his report on medications in the past - none now -his last several hemoglobin a1cs have been in mild prediabetic range -has not done diabetic eye exam in last one year -denies: polyuria, polydipsia, vision changes  Tobacco use: -1 pack per week, has smoked for 40 years but never much - reports < 30 pack year -no ready to quit -denies cough, hemoptysis  ROS: See pertinent positives and negatives per HPI.  Past Medical History  Diagnosis Date  . Hypertension   . Diabetes mellitus without complication ([place])   . Hyperlipidemia   . Gout 04/16/2013  . DDD (degenerative disc disease), lumbar     per his report  . Tobacco use disorder 03/28/2015    Past Surgical History  Procedure Laterality Date  . Tonsillectomy    . Colonoscopy      Family History  Problem Relation Age of Onset  . Hypertension Father   . Diabetes Father   . Stroke Father     Social History   Social History  . Marital Status: Married    Spouse Name: N/A  . Number of Children: N/A  . Years of Education: N/A   Social History Main Topics  . Smoking status: Current Every Day Smoker    Types: Cigarettes  . Smokeless tobacco: None     Comment: per patient 5 cigarettes a day   . Alcohol Use: Yes     Comment: occasional  . Drug Use: No  . Sexual Activity: Not Asked   Other Topics Concern  . 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  amLODipine (NORVASC) 10 MG tablet, Take 1 tablet (10 mg total) by mouth daily., Disp: 90 tablet, Rfl: 3 .  losartan-hydrochlorothiazide (HYZAAR) 100-25 MG tablet, Take 1 tablet by mouth daily., Disp: 90 tablet, Rfl: 3  EXAM:  Filed Vitals:   12/12/15 1552  BP: 122/90  Pulse: 97  Temp: 99.1 F (37.3 C)    Body mass index is 30.46 kg/(m^2).  GENERAL: vitals reviewed and listed above, alert, oriented, appears well hydrated and in no acute distress  HEENT: atraumatic, conjunttiva clear, no obvious abnormalities on inspection of external nose and ears  NECK: no obvious masses on inspection  LUNGS: clear to auscultation bilaterally, no wheezes, rales or rhonchi, good air movement  CV: HRRR, no peripheral edema  MS: moves all extremities without noticeable abnormality  PSYCH: pleasant and cooperative, no obvious depression or anxiety  ASSESSMENT AND PLAN:  Discussed the following assessment and plan:  Essential hypertension, benign -bp has improved on triple therapy -he continues to smoke and admits lifestyle could be better - we discussed this at length and opted to work on this rather then eval for 2ndary causes(less likely) or add anthyprtensive  Hyperlipemia -lifestyle recs  Diet-controlled  type 2 diabetes mellitus ([place]) -advised to schedule eye exam -lifestyle recs  Tobacco use disorder -counseled 3-5 minutes, advised to quit, offered help - motivation poor - he is well informed on significant risks, not a candidate for lung cancer screening  -Patient advised to return or notify a doctor immediately if symptoms worsen or persist or new concerns arise.  Patient Instructions  BEFORE YOU LEAVE: -Schedule Physical Exam in 4 months  Schedule your diabetic eye exam  Please quit smoking - try nicotine gum or lozenge instead of smoking  Continue current medications  We recommend the following healthy  lifestyle measures: - eat a healthy whole foods diet consisting of regular small meals composed of vegetables, fruits, beans, nuts, seeds, healthy meats such as white chicken and fish and whole grains.  - avoid sweets, white starchy foods, fried foods, fast food, processed foods, sodas, red meet and other fattening foods.  - get a least 150-300 minutes of aerobic exercise per week.       Colin Benton R.

## 2015-12-12 NOTE — Progress Notes (Signed)
Pre visit review using our clinic review tool, if applicable. No additional management support is needed unless otherwise documented below in the visit note. 

## 2016-04-05 ENCOUNTER — Ambulatory Visit (INDEPENDENT_AMBULATORY_CARE_PROVIDER_SITE_OTHER): Payer: 59 | Admitting: Family Medicine

## 2016-04-05 ENCOUNTER — Encounter: Payer: Self-pay | Admitting: Family Medicine

## 2016-04-05 VITALS — BP 124/98 | HR 74 | Temp 98.4°F | Ht 70.25 in | Wt 213.9 lb

## 2016-04-05 DIAGNOSIS — K6389 Other specified diseases of intestine: Secondary | ICD-10-CM

## 2016-04-05 DIAGNOSIS — Z0000 Encounter for general adult medical examination without abnormal findings: Secondary | ICD-10-CM | POA: Diagnosis not present

## 2016-04-05 DIAGNOSIS — K529 Noninfective gastroenteritis and colitis, unspecified: Secondary | ICD-10-CM

## 2016-04-05 DIAGNOSIS — L84 Corns and callosities: Secondary | ICD-10-CM

## 2016-04-05 DIAGNOSIS — B353 Tinea pedis: Secondary | ICD-10-CM

## 2016-04-05 DIAGNOSIS — I10 Essential (primary) hypertension: Secondary | ICD-10-CM

## 2016-04-05 DIAGNOSIS — F17200 Nicotine dependence, unspecified, uncomplicated: Secondary | ICD-10-CM

## 2016-04-05 DIAGNOSIS — E119 Type 2 diabetes mellitus without complications: Secondary | ICD-10-CM | POA: Diagnosis not present

## 2016-04-05 DIAGNOSIS — E785 Hyperlipidemia, unspecified: Secondary | ICD-10-CM | POA: Diagnosis not present

## 2016-04-05 HISTORY — DX: Noninfective gastroenteritis and colitis, unspecified: K52.9

## 2016-04-05 LAB — CBC
HEMATOCRIT: 41.3 % (ref 39.0–52.0)
Hemoglobin: 13.7 g/dL (ref 13.0–17.0)
MCHC: 33.1 g/dL (ref 30.0–36.0)
MCV: 91.6 fl (ref 78.0–100.0)
Platelets: 186 10*3/uL (ref 150.0–400.0)
RBC: 4.51 Mil/uL (ref 4.22–5.81)
RDW: 13.9 % (ref 11.5–15.5)
WBC: 4.4 10*3/uL (ref 4.0–10.5)

## 2016-04-05 LAB — LIPID PANEL
CHOLESTEROL: 220 mg/dL — AB (ref 0–200)
HDL: 63.4 mg/dL (ref >39.00)
LDL Cholesterol: 147 mg/dL — ABNORMAL HIGH (ref 0–99)
NonHDL: 156.25
TRIGLYCERIDES: 47 mg/dL (ref 0.0–149.0)
Total CHOL/HDL Ratio: 3
VLDL: 9.4 mg/dL (ref 0.0–40.0)

## 2016-04-05 LAB — HEMOGLOBIN A1C: Hgb A1c MFr Bld: 6.3 % (ref 4.6–6.5)

## 2016-04-05 NOTE — Progress Notes (Signed)
Pre visit review using our clinic review tool, if applicable. No additional management support is needed unless otherwise documented below in the visit note. 

## 2016-04-05 NOTE — Patient Instructions (Signed)
BEFORE YOU LEAVE: -schedule follow up in 1 month regarding blood pressure and smoking -labs  FOR THE BLOOD PRESSURE: -you refused to add another medication and instead have agreed to: 1) quit smoking 2) improve diet: cut out chips and junk food 3) follow up in 1 month  -We placed a referral for you as discussed to the gastroenterologist regarding the inflammatory bowel disease and colonoscopy, and to the foot doctor regarding your calluses. It usually takes about 1-2 weeks to process and schedule this referral. If you have not heard from [place] regarding this appointment in 2 weeks please contact our office.  Use over the counter atheletes foot cream twice daily for 1 month.  Use amlactin foot cream at night on feet  We recommend the following healthy lifestyle measures: - eat a healthy whole foods diet consisting of regular small meals composed of vegetables, fruits, beans, nuts, seeds, healthy meats such as white chicken and fish and whole grains.  - avoid sweets, white starchy foods, fried foods, fast food, processed foods, sodas, red meet and other fattening foods.  - get a least 150-300 minutes of aerobic exercise per week.   We have ordered labs or studies at this visit. It can take up to 1-2 weeks for results and processing. IF results require follow up or explanation, we will call you with instructions. Clinically stable results will be released to your [facility]. If you have not heard from [place] or cannot find your results in [facility] in 2 weeks please contact our office at [phone].  If you are not yet signed up for [facility], please consider signing up.

## 2016-04-05 NOTE — Progress Notes (Signed)
HPI:  Here for CPE:  -Concerns and/or follow up today:   HTN: -meds: triple therapy with losartan-hctz 100-25, norvasc increased to 55m  -some issues with compliance in the past, reports now taking daily -he does not want to increase medications or add medications, refuses today -he reports he will instead quit smoking and give up chips and recheck -reports:dong well -denies:cp, sob, doe, swelling  HLD:  -wanted to do lifestyle interventions and improved with ratio 3 on recent check -reports:no regular exercise - but is active; diet is better  DM:  -per his report on medications in the past - none now -his last several hemoglobin a1cs have been in mild prediabetic range -has not done diabetic eye exam in last one year -denies: polyuria, polydipsia, vision changes  Tobacco use: -1 pack per week, has smoked for 40 years but never much - reports < 30 pack year -not ready initially, then when advised to add BP medication he agreed to quit instead of adding medication -reports wife is on him to quit -does not wish to use medications to assist -denies cough, hemoptysis  -Diet: variety of foods, but doe seat a lot of sweets and chips  -Exercise: active in yard, walks all day with job  -Diabetes and Dyslipidemia Screening: FASTING today   -Hx of HTN: no  -Vaccines: refused  -sexual activity: yes, male partner, no new partners  -wants STI testing, Hep C screening (if born 185-1966: no  -FH colon or prstate ca: see FH Last colon cancer screening: 2010, her reports normal - [facility]- hyperplastic polyp and ? IBD, he tells me he took meds for awhile but symptoms better OFF and no symptoms in > 1 year Last prostate ca screening: discussed and he refused  -Alcohol, Tobacco, drug use: see social history  Review of Systems - no fevers, unintentional weight loss, vision loss, hearing loss, chest pain, sob, hemoptysis, melena, hematochezia, hematuria,  genital discharge, changing or concerning skin lesions, bleeding, bruising, loc, thoughts of self harm or SI  Past Medical History  Diagnosis Date  . Hypertension   . [place]   . Hyperlipidemia   . Gout 04/16/2013  . DDD (degenerative disc disease), lumbar     per his report  . Tobacco use disorder 03/28/2015    Past Surgical History  Procedure Laterality Date  . Tonsillectomy    . Colonoscopy      Family History  Problem Relation Age of Onset  . Hypertension Father   . Diabetes Father   . Stroke Father     Social History   Social History  . Marital Status: Married    Spouse Name: N/A  . Number of Children: N/A  . Years of Education: N/A   Social History Main Topics  . Smoking status: Current Every Day Smoker    Types: Cigarettes  . Smokeless tobacco: None     Comment: per patient 5 cigarettes a day   . Alcohol Use: Yes     Comment: occasional  . Drug Use: No  . Sexual Activity: Not Asked   Other Topics Concern  . 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  amLODipine (NORVASC) 10 MG tablet, Take 1 tablet (10 mg total) by mouth daily., Disp: 90 tablet, Rfl: 3 .  losartan-hydrochlorothiazide (HYZAAR) 100-25 MG tablet, Take 1 tablet by mouth daily., Disp: 90 tablet, Rfl: 3  EXAM:  Filed Vitals:   04/05/16 0820  BP: 124/98  Pulse: 74  Temp: 98.4 F (36.9 C)  TempSrc: Oral  Height: 5' 10.25" (1.784 m)  Weight: 213 lb 14.4 oz (97.024 kg)    Estimated body mass index is 30.49 kg/(m^2) as calculated from the following:   Height as of this encounter: 5' 10.25" (1.784 m).   Weight as of this encounter: 213 lb 14.4 oz (97.024 kg).  GENERAL: vitals reviewed and listed below, alert, oriented, appears well  hydrated and in no acute distress  HEENT: head atraumatic, PERRLA, normal appearance of eyes, ears, nose and mouth. moist mucus membranes.  NECK: supple, no masses or lymphadenopathy  LUNGS: clear to auscultation bilaterally, no rales, rhonchi or wheeze  CV: HRRR, no peripheral edema or cyanosis, normal pedal pulses  ABDOMEN: bowel sounds normal, soft, non tender to palpation, no masses, no rebound or guarding  GU: refused  RECTAL: refused  SKIN: no rash or abnormal lesions; dry scaly skin on feet between toes, callus formation on L lat forefoot; refused full skin exam  MS: normal gait, moves all extremities normally  NEURO: CN II-XII grossly intact, normal muscle strength and sensation to light touch on extremities  PSYCH: normal affect, pleasant and cooperative  ASSESSMENT AND PLAN:  Discussed the following assessment and plan:  Visit for preventive health examination -Discussed and advised all [place] preventive services health task force level A and B recommendations for age, sex and risks. -Advised at least 150 minutes of exercise per week and a healthy diet low in saturated fats and sweets and consisting of fresh fruits and vegetables, lean meats such as fish and white chicken and whole grains. -FASTING labs, studies and vaccines per orders this encounter -he refused prostate cancer screening after discussion options and risks/benefits -unsure of when next colonosocpy due, had colonoscopy in 2010 with [facility] and apparently had IBD, intermittent bleeding since, but reports much better off IBD medications and no symptoms in 1 year. He is interested in seeing GI with [facility] to discuss his IBD further and for future colon cancer screens. Referral placed.  Diet-controlled type 2 diabetes mellitus ([place]) - Plan: Ambulatory referral to Podiatry, Hemoglobin A1c -lifestyle recs -advise to quit smoking -advises better control of HTN and cholesterol - he does NOT want to take more  medications  Essential hypertension, benign - Plan: CMP with eGFR, CBC (no diff) -refused changes to med -agreed to quit smoking and work on lifestyle with 1 month f/u  Hyperlipemia - Plan: Lipid Panel  Tobacco use disorder -counseled 3-5 mintues -he agreed to quit, does not feel he needs help with medications at this time  Callus of foot - Plan: Ambulatory referral to Podiatry  Tinea pedis of both feet -topical azole  IBD (inflammatory bowel disease) - Plan: Ambulatory referral to Gastroenterology -see above     Patient advised to return to clinic immediately if symptoms worsen or persist or new concerns.  Patient Instructions  BEFORE YOU LEAVE: -schedule follow up in 1 month regarding blood pressure and smoking -labs  FOR THE BLOOD PRESSURE: -you refused to add another medication and instead have agreed to: 1) quit smoking 2) improve diet: cut out chips and junk food 3) follow up  in 1 month  -We placed a referral for you as discussed to the gastroenterologist regarding the inflammatory bowel disease and colonoscopy, and to the foot doctor regarding your calluses. It usually takes about 1-2 weeks to process and schedule this referral. If you have not heard from [place] regarding this appointment in 2 weeks please contact our office.  Use over the counter atheletes foot cream twice daily for 1 month.  Use amlactin foot cream at night on feet  We recommend the following healthy lifestyle measures: - eat a healthy whole foods diet consisting of regular small meals composed of vegetables, fruits, beans, nuts, seeds, healthy meats such as white chicken and fish and whole grains.  - avoid sweets, white starchy foods, fried foods, fast food, processed foods, sodas, red meet and other fattening foods.  - get a least 150-300 minutes of aerobic exercise per week.   We have ordered labs or studies at this visit. It can take up to 1-2 weeks for results and processing. IF results  require follow up or explanation, we will call you with instructions. Clinically stable results will be released to your [facility]. If you have not heard from [place] or cannot find your results in [facility] in 2 weeks please contact our office at [phone].  If you are not yet signed up for [facility], please consider signing up.           No Follow-up on file.   Samuel Butler R.

## 2016-04-06 LAB — COMPLETE METABOLIC PANEL WITH GFR
ALBUMIN: 4.1 g/dL (ref 3.6–5.1)
ALK PHOS: 61 U/L (ref 40–115)
ALT: 16 U/L (ref 9–46)
AST: 16 U/L (ref 10–35)
BILIRUBIN TOTAL: 0.9 mg/dL (ref 0.2–1.2)
BUN: 15 mg/dL (ref 7–25)
CO2: 31 mmol/L (ref 20–31)
Calcium: 8.8 mg/dL (ref 8.6–10.3)
Chloride: 102 mmol/L (ref 98–110)
Creat: 0.97 mg/dL (ref 0.70–1.33)
GFR, EST NON AFRICAN AMERICAN: 87 mL/min (ref >=60)
GFR, Est African American: >89 mL/min (ref >=60)
GLUCOSE: 87 mg/dL (ref 65–99)
POTASSIUM: 3.3 mmol/L — AB (ref 3.5–5.3)
SODIUM: 143 mmol/L (ref 135–146)
TOTAL PROTEIN: 6.2 g/dL (ref 6.1–8.1)

## 2016-04-09 MED ORDER — ROSUVASTATIN CALCIUM 10 MG PO TABS: 10.0000 mg | ORAL_TABLET | Freq: Every day | ORAL | Status: DC

## 2016-04-09 NOTE — Addendum Note (Signed)
Addended by: Agnes Lawrence on: 04/09/2016 09:52 AM   Modules accepted: Orders

## 2016-05-06 ENCOUNTER — Encounter: Payer: Self-pay | Admitting: Family Medicine

## 2016-05-10 ENCOUNTER — Encounter: Payer: Self-pay | Admitting: Family Medicine

## 2016-05-10 ENCOUNTER — Ambulatory Visit (INDEPENDENT_AMBULATORY_CARE_PROVIDER_SITE_OTHER): Payer: 59 | Admitting: Family Medicine

## 2016-05-10 VITALS — BP 128/88 | HR 89 | Temp 98.9°F | Ht 70.25 in | Wt 214.3 lb

## 2016-05-10 DIAGNOSIS — Z6830 Body mass index (BMI) 30.0-30.9, adult: Secondary | ICD-10-CM

## 2016-05-10 DIAGNOSIS — E119 Type 2 diabetes mellitus without complications: Secondary | ICD-10-CM | POA: Diagnosis not present

## 2016-05-10 DIAGNOSIS — E785 Hyperlipidemia, unspecified: Secondary | ICD-10-CM | POA: Diagnosis not present

## 2016-05-10 DIAGNOSIS — I10 Essential (primary) hypertension: Secondary | ICD-10-CM | POA: Diagnosis not present

## 2016-05-10 MED ORDER — ROSUVASTATIN CALCIUM 10 MG PO TABS: ORAL_TABLET | ORAL | Status: DC

## 2016-05-10 NOTE — Patient Instructions (Addendum)
BEFORE YOU LEAVE: -follow up:  in 3-4 months  We recommend the following healthy lifestyle measures: - eat a healthy whole foods diet consisting of regular small meals composed of vegetables, fruits, beans, nuts, seeds, healthy meats such as white chicken and fish and whole grains.  - avoid sweets, white starchy foods, fried foods, fast food, processed foods, sodas, red meet and other fattening foods.  - get a least 150-300 minutes of aerobic exercise per week.   Goal 195!!!! Those are your words! :)

## 2016-05-10 NOTE — Progress Notes (Signed)
HPI:  Follow up HTN/HLD: -he really does not want to increase or add medicines -he really wants to work on diet instead -he has not tolerated lipitor or crestor for cholesterol - caused joint pains - he thinks he may tolerate the crestor 2 days per week -has cut out fried foods which is a big change, trying to cut back on carbs and starches -no aerobic exercise, but reports he never sits down and is very active at work and in yard when at home  ROS: See pertinent positives and negatives per HPI.  Past Medical History  Diagnosis Date  . Hypertension   . Diabetes mellitus without complication ([place])   . Hyperlipidemia   . Gout 04/16/2013  . DDD (degenerative disc disease), lumbar     per his report  . Tobacco use disorder 03/28/2015  . IBD (inflammatory bowel disease) 04/05/2016    Past Surgical History  Procedure Laterality Date  . Tonsillectomy    . Colonoscopy      Family History  Problem Relation Age of Onset  . Hypertension Father   . Diabetes Father   . Stroke Father     Social History   Social History  . Marital Status: Married    Spouse Name: N/A  . Number of Children: N/A  . Years of Education: N/A   Social History Main Topics  . Smoking status: Current Every Day Smoker    Types: Cigarettes  . Smokeless tobacco: None     Comment: per patient 5 cigarettes a day   . Alcohol Use: Yes     Comment: occasional  . Drug Use: No  . Sexual Activity: Not Asked   Other Topics Concern  . 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  amLODipine (NORVASC) 10 MG tablet, Take 1 tablet (10 mg total) by mouth daily., Disp: 90 tablet, Rfl: 3 .  losartan-hydrochlorothiazide (HYZAAR) 100-25 MG tablet, Take 1 tablet by mouth daily., Disp: 90 tablet, Rfl:  3 .  rosuvastatin (CRESTOR) 10 MG tablet, Take twice weekly, Disp: 30 tablet, Rfl: 3  EXAM:  Filed Vitals:   05/10/16 1547  BP: 128/88  Pulse: 89  Temp: 98.9 F (37.2 C)    Body mass index is 30.54 kg/(m^2).  GENERAL: vitals reviewed and listed above, alert, oriented, appears well hydrated and in no acute distress  HEENT: atraumatic, conjunttiva clear, no obvious abnormalities on inspection of external nose and ears  NECK: no obvious masses on inspection  LUNGS: clear to auscultation bilaterally, no wheezes, rales or rhonchi, good air movement  CV: HRRR, no peripheral edema  MS: moves all extremities without noticeable abnormality  PSYCH: pleasant and cooperative, no obvious depression or anxiety  ASSESSMENT AND PLAN:  Discussed the following assessment and plan:  Essential hypertension, benign  Hyperlipemia  BMI 30.0-30.9,adult  Diet-controlled type 2 diabetes mellitus ([place])  -wt unchanged despite recent reported diet changes -BP ok -discussed risks untreated hld/htn, treatment option both pharmacological and nonpharmacological and risks -he has opted to try twice weekly statin and to not make changes to the BP regimen -he agrees to continue to work on lifestyle changes and wt loss and he set a goal  of 195lbs for our next visit -Patient advised to return or notify a doctor immediately if symptoms worsen or persist or new concerns arise.  Patient Instructions  BEFORE YOU LEAVE: -follow up:  in 3-4 months  We recommend the following healthy lifestyle measures: - eat a healthy whole foods diet consisting of regular small meals composed of vegetables, fruits, beans, nuts, seeds, healthy meats such as white chicken and fish and whole grains.  - avoid sweets, white starchy foods, fried foods, fast food, processed foods, sodas, red meet and other fattening foods.  - get a least 150-300 minutes of aerobic exercise per week.   Goal 195!!!! Those are your words!  :)     Samuel Butler R., DO

## 2016-05-10 NOTE — Progress Notes (Signed)
Pre visit review using our clinic review tool, if applicable. No additional management support is needed unless otherwise documented below in the visit note. 

## 2016-05-17 ENCOUNTER — Other Ambulatory Visit: Payer: Self-pay | Admitting: *Deleted

## 2016-08-30 ENCOUNTER — Encounter: Payer: Self-pay | Admitting: Family Medicine

## 2016-08-30 ENCOUNTER — Ambulatory Visit (INDEPENDENT_AMBULATORY_CARE_PROVIDER_SITE_OTHER): Payer: 59 | Admitting: Family Medicine

## 2016-08-30 VITALS — BP 110/80 | HR 89 | Temp 98.4°F | Ht 70.25 in | Wt 211.3 lb

## 2016-08-30 DIAGNOSIS — M25552 Pain in left hip: Secondary | ICD-10-CM

## 2016-08-30 DIAGNOSIS — M79604 Pain in right leg: Secondary | ICD-10-CM | POA: Diagnosis not present

## 2016-08-30 DIAGNOSIS — E119 Type 2 diabetes mellitus without complications: Secondary | ICD-10-CM

## 2016-08-30 DIAGNOSIS — E785 Hyperlipidemia, unspecified: Secondary | ICD-10-CM

## 2016-08-30 DIAGNOSIS — I10 Essential (primary) hypertension: Secondary | ICD-10-CM | POA: Diagnosis not present

## 2016-08-30 DIAGNOSIS — M25561 Pain in right knee: Secondary | ICD-10-CM | POA: Diagnosis not present

## 2016-08-30 DIAGNOSIS — F17200 Nicotine dependence, unspecified, uncomplicated: Secondary | ICD-10-CM

## 2016-08-30 NOTE — Patient Instructions (Addendum)
BEFORE YOU LEAVE: -xray sheet -poc hgba1c -calf strain exercises -follow up: 2 -3 months, come fasting if possible for lab check   Schedule your eye exam  -We placed a referral for you as discussed for the ultrasound of the right leg. It usually takes about 1-2 weeks to process and schedule this referral. If you have not heard from [place] regarding this appointment in 2 weeks please contact our office.   We recommend the following healthy lifestyle for LIFE: 1) Small portions.   Tip: eat off of a salad plate instead of a dinner plate.  Tip: It is ok to feel hungry after a meal - that likely means you ate an appropriate portion.  Tip: if you need more or a snack choose fruits, veggies and/or a handful of nuts or seeds.  2) Eat a healthy clean diet.  * Tip: Avoid (less then 1 serving per week): processed foods, sweets, sweetened drinks, white starches (rice, flour, bread, potatoes, pasta, etc), red meat, fast foods, butter  *Tip: CHOOSE instead   * 5-9 servings per day of fresh or frozen fruits and vegetables (but not corn, potatoes, bananas, canned or dried fruit)   *nuts and seeds, beans   *olives and olive oil   *small portions of lean meats such as fish and white chicken    *small portions of whole grains  3)Get at least 150 minutes of sweaty aerobic exercise per week.  4)Reduce stress - consider counseling, meditation and relaxation to balance other aspects of your life.

## 2016-08-30 NOTE — Progress Notes (Signed)
Pre visit review using our clinic review tool, if applicable. No additional management support is needed unless otherwise documented below in the visit note. 

## 2016-08-30 NOTE — Progress Notes (Signed)
HPI:  Samuel Butler is a pleasant 55 year old with a past medical history significant for hypertension, mild diabetes, hyperlipidemia and tobacco use here for follow-up. He has been trying to work on regular exercise and healthy diet. He has several new concerns. He has had some pain in the right lateral calf for about 2 months that only occurs after he has been on his feet for a long time. His wife is worried about a blood clot and wants the picture this. He has occasional swelling in the right knee and pain in this knee chronically. He also has some left hip pain that also has been going on for several months. Sometimes the pain is severe with certain movements if he twists the hip while weightbearing. He denies weakness, numbness, chest pain, shortness of breath, polyuria, radiation of pain, malaise, shortness of breath, fevers or diffuse swelling of the legs. He does not want to do his labs today and prefers to do them at follow up. Not taking statin as thinks it causes aches and pains.  ROS: See pertinent positives and negatives per HPI.  Past Medical History:  Diagnosis Date  . DDD (degenerative disc disease), lumbar    per his report  . Diabetes mellitus without complication ([place])   . Gout 04/16/2013  . Hyperlipidemia   . Hypertension   . IBD (inflammatory bowel disease) 04/05/2016  . Tobacco use disorder 03/28/2015    Past Surgical History:  Procedure Laterality Date  . COLONOSCOPY    . TONSILLECTOMY      Family History  Problem Relation Age of Onset  . Hypertension Father   . Diabetes Father   . Stroke Father     Social History   Social History  . Marital status: Married    Spouse name: N/A  . Number of children: N/A  . Years of education: N/A   Social History Main Topics  . Smoking status: Current Every Day Smoker    Types: Cigarettes  . Smokeless tobacco: None     Comment: per patient 5 cigarettes a day   . Alcohol use Yes     Comment: occasional  . Drug use:  No  . Sexual activity: Not Asked   Other Topics Concern  . 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  amLODipine (NORVASC) 10 MG tablet, Take 1 tablet (10 mg total) by mouth daily., Disp: 90 tablet, Rfl: 3 .  losartan-hydrochlorothiazide (HYZAAR) 100-25 MG tablet, Take 1 tablet by mouth daily., Disp: 90 tablet, Rfl: 3  EXAM:  Vitals:   08/30/16 1557  BP: 110/80  Pulse: 89  Temp: 98.4 F (36.9 C)    Body mass index is 30.1 kg/m.  GENERAL: vitals reviewed and listed above, alert, oriented, appears well hydrated and in no acute distress  HEENT: atraumatic, conjunttiva clear, no obvious abnormalities on inspection of external nose and ears  NECK: no obvious masses on inspection  LUNGS: clear to auscultation bilaterally, no wheezes, rales or rhonchi, good air movement  CV: HRRR, no peripheral edema, normal pedal pulses   Foot exam : normal monofilament testing and normal diabetic foot exam bilaterally  MS: moves all extremities without noticeable abnormality,  normal inspection of the right lower leg, tenderness to palpation in the gastroc muscle in the area of concern, no deep vein tenderness to palpation, no edema except for mild edema The right knee joint with patellar crepitus, negative Lachman negative McMurray, mild medial joint line tenderness to palpation. Tenderness to palpation over the left hip joint, with pain on external and internal rotation of the hip without limitation in range of motion.    PSYCH: pleasant and cooperative, no obvious depression or anxiety  ASSESSMENT AND PLAN:  Discussed the following assessment and plan:  Right leg pain - Plan: VAS [place] LOWER EXTREMITY VENOUS (DVT)  Left hip pain - Plan: DG HIP UNILAT WITH PELVIS 2-3  VIEWS LEFT  Right knee pain, unspecified chronicity  Essential hypertension, benign  Hyperlipidemia, unspecified hyperlipidemia type  Diet-controlled type 2 diabetes mellitus ([facility])  Tobacco use disorder  -Numerous chronic conditions and multiple new complaints today -Advised labs, but he wanted to hold off on this until his next appointment -Refuses statin  -We'll get an x-ray of the left hip and ultrasound of the right calf as his wife is concerned for blood clot  -  I think that his pain in his right leg is more consistent with a strain or sprain and exercises for this were given -He also likely suffering from osteoarthritis and discussed treatment options for this, will get x-ray of the left hip to further evaluate - may need or the referral persistent or worsening symptoms as he also has a history of gout -Lifestyle recommendations -Blood pressures improved  -Patient advised to return or notify a doctor immediately if symptoms worsen or persist or new concerns arise.  Patient Instructions  BEFORE YOU LEAVE: -xray sheet -poc hgba1c -calf strain exercises -follow up: 2 -3 months, come fasting if possible for lab check   Schedule your eye exam  -We placed a referral for you as discussed for the ultrasound of the right leg. It usually takes about 1-2 weeks to process and schedule this referral. If you have not heard from [place] regarding this appointment in 2 weeks please contact our office.   We recommend the following healthy lifestyle for LIFE: 1) Small portions.   Tip: eat off of a salad plate instead of a dinner plate.  Tip: It is ok to feel hungry after a meal - that likely means you ate an appropriate portion.  Tip: if you need more or a snack choose fruits, veggies and/or a handful of nuts or seeds.  2) Eat a healthy clean diet.  * Tip: Avoid (less then 1 serving per week): processed foods, sweets, sweetened drinks, white starches (rice, flour, bread, potatoes, pasta,  etc), red meat, fast foods, butter  *Tip: CHOOSE instead   * 5-9 servings per day of fresh or frozen fruits and vegetables (but not corn, potatoes, bananas, canned or dried fruit)   *nuts and seeds, beans   *olives and olive oil   *small portions of lean meats such as fish and white chicken    *small portions of whole grains  3)Get at least 150 minutes of sweaty aerobic exercise per week.  4)Reduce stress - consider counseling, meditation and relaxation to balance other aspects of your life.      Colin Benton R., DO

## 2016-09-12 ENCOUNTER — Ambulatory Visit
Admission: RE | Admit: 2016-09-12 | Discharge: 2016-09-12 | Disposition: A | Discharge status: Home / Self Care | Payer: 59 | Source: Ambulatory Visit | Attending: Family Medicine | Admitting: Family Medicine

## 2016-09-12 DIAGNOSIS — M25552 Pain in left hip: Secondary | ICD-10-CM

## 2016-11-09 ENCOUNTER — Other Ambulatory Visit: Payer: Self-pay | Admitting: Family Medicine

## 2016-11-12 ENCOUNTER — Telehealth: Payer: Self-pay | Admitting: Family Medicine

## 2016-11-13 NOTE — Telephone Encounter (Signed)
Pt request refill  amLODipine (NORVASC) 10 MG tablet   [facility] [address]  ([facility] losartan X2)

## 2016-11-14 NOTE — Telephone Encounter (Signed)
Rx re-sent to [facility]

## 2016-12-06 ENCOUNTER — Ambulatory Visit: Payer: 59 | Admitting: Family Medicine

## 2016-12-13 ENCOUNTER — Encounter: Payer: Self-pay | Admitting: Family Medicine

## 2016-12-13 ENCOUNTER — Ambulatory Visit (INDEPENDENT_AMBULATORY_CARE_PROVIDER_SITE_OTHER): Payer: Managed Care, Other (non HMO) | Admitting: Family Medicine

## 2016-12-13 VITALS — BP 112/80 | HR 88 | Temp 98.8°F | Ht 70.25 in | Wt 223.4 lb

## 2016-12-13 DIAGNOSIS — I10 Essential (primary) hypertension: Secondary | ICD-10-CM

## 2016-12-13 DIAGNOSIS — E785 Hyperlipidemia, unspecified: Secondary | ICD-10-CM

## 2016-12-13 DIAGNOSIS — E119 Type 2 diabetes mellitus without complications: Secondary | ICD-10-CM | POA: Diagnosis not present

## 2016-12-13 DIAGNOSIS — J111 Influenza due to unidentified influenza virus with other respiratory manifestations: Secondary | ICD-10-CM | POA: Diagnosis not present

## 2016-12-13 NOTE — Progress Notes (Signed)
HPI:  Samuel Butler is a pleasant 56 yo with a PMH significant for DM, HTN, HLD, obesity and IBD here for follow up. He reports he has been eating healthy and exercising and has been doing well. All of his family including himself it seems had the flu earlier this week. He now feels better with only mild nasal congestion and sneezing.Denies CP, SOB, DOE, sinus pain or ear pain. Wants to set up fasting labs visit as does not wish to get labs today.  ROS: See pertinent positives and negatives per HPI.  Past Medical History:  Diagnosis Date  . DDD (degenerative disc disease), lumbar    per his report  . Diabetes mellitus without complication ([place])   . Gout 04/16/2013  . Hyperlipidemia   . Hypertension   . IBD (inflammatory bowel disease) 04/05/2016  . Tobacco use disorder 03/28/2015    Past Surgical History:  Procedure Laterality Date  . COLONOSCOPY    . TONSILLECTOMY      Family History  Problem Relation Age of Onset  . Hypertension Father   . Diabetes Father   . Stroke Father     Social History   Social History  . Marital status: Married    Spouse name: N/A  . Number of children: N/A  . Years of education: N/A   Social History Main Topics  . Smoking status: Current Every Day Smoker    Types: Cigarettes  . Smokeless tobacco: Never Used     Comment: per patient 5 cigarettes a day   . Alcohol use Yes     Comment: occasional  . Drug use: No  . Sexual activity: Not Asked   Other Topics Concern  . 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  amLODipine (NORVASC) 10 MG tablet, Take 1 tablet (10 mg total) by mouth daily., Disp: 90 tablet, Rfl: 3 .  losartan-hydrochlorothiazide (HYZAAR) 100-25 MG tablet, TAKE ONE TABLET BY MOUTH ONCE DAILY, Disp: 90  tablet, Rfl: 0  EXAM:  Vitals:   12/13/16 1536  BP: 112/80  Pulse: 88  Temp: 98.8 F (37.1 C)    Body mass index is 31.83 kg/m.  GENERAL: vitals reviewed and listed above, alert, oriented, appears well hydrated and in no acute distress  HEENT: atraumatic, conjunttiva clear, no obvious abnormalities on inspection of external nose and ears, normal appearance of ear canals and TMs, clear nasal congestion, mild post oropharyngeal erythema with PND, no tonsillar edema or exudate, no sinus TTP  NECK: no obvious masses on inspection  LUNGS: clear to auscultation bilaterally, no wheezes, rales or rhonchi, good air movement  CV: HRRR, no peripheral edema  MS: moves all extremities without noticeable abnormality  PSYCH: pleasant and cooperative, no obvious depression or anxiety  ASSESSMENT AND PLAN:  Discussed the following assessment and plan:  Essential hypertension, benign - Plan: Basic metabolic panel, CBC (no diff)  Hyperlipidemia, unspecified hyperlipidemia type - Plan: Lipid Panel  Diet-controlled type 2 diabetes mellitus ([place]) - Plan: Hemoglobin A1c  Influenza  -labs - he wants to schedule fasting lab visit -lifestyle recs -continue BP meds for now -follow up 3-4 months -Patient advised to return or notify a doctor  immediately if symptoms worsen or persist or new concerns arise.  Patient Instructions  BEFORE YOU LEAVE: -schedule fasting lab visit in 1-3 weeks -follow up: 3 months  Call your eye doctor today to schedule your diabetic eye exam  We have ordered labs or studies at this visit. It can take up to 1-2 weeks for results and processing. IF results require follow up or explanation, we will call you with instructions. Clinically stable results will be released to your [facility]. If you have not heard from [place] or cannot find your results in [facility] in 2 weeks please contact our office at [phone].  If you are not yet signed up for [facility], please consider  signing up.    We recommend the following healthy lifestyle for LIFE: 1) Small portions.   Tip: eat off of a salad plate instead of a dinner plate.  Tip: if you need more or a snack choose fruits, veggies and/or a handful of nuts or seeds.  2) Eat a healthy clean diet.  * Tip: Avoid (less then 1 serving per week): processed foods, sweets, sweetened drinks, white starches (rice, flour, bread, potatoes, pasta, etc), red meat, fast foods, butter  *Tip: CHOOSE instead   * 5-9 servings per day of fresh or frozen fruits and vegetables (but not corn, potatoes, bananas, canned or dried fruit)   *nuts and seeds, beans   *olives and olive oil   *small portions of lean meats such as fish and white chicken    *small portions of whole grains  3)Get at least 150 minutes of sweaty aerobic exercise per week.  4)Reduce stress - consider counseling, meditation and relaxation to balance other aspects of your life.         Colin Benton R., DO

## 2016-12-13 NOTE — Progress Notes (Signed)
Pre visit review using our clinic review tool, if applicable. No additional management support is needed unless otherwise documented below in the visit note. 

## 2016-12-13 NOTE — Patient Instructions (Addendum)
BEFORE YOU LEAVE: -schedule fasting lab visit in 1-3 weeks -follow up: 3 months  Call your eye doctor today to schedule your diabetic eye exam  We have ordered labs or studies at this visit. It can take up to 1-2 weeks for results and processing. IF results require follow up or explanation, we will call you with instructions. Clinically stable results will be released to your [facility]. If you have not heard from [place] or cannot find your results in [facility] in 2 weeks please contact our office at [phone].  If you are not yet signed up for [facility], please consider signing up.    We recommend the following healthy lifestyle for LIFE: 1) Small portions.   Tip: eat off of a salad plate instead of a dinner plate.  Tip: if you need more or a snack choose fruits, veggies and/or a handful of nuts or seeds.  2) Eat a healthy clean diet.  * Tip: Avoid (less then 1 serving per week): processed foods, sweets, sweetened drinks, white starches (rice, flour, bread, potatoes, pasta, etc), red meat, fast foods, butter  *Tip: CHOOSE instead   * 5-9 servings per day of fresh or frozen fruits and vegetables (but not corn, potatoes, bananas, canned or dried fruit)   *nuts and seeds, beans   *olives and olive oil   *small portions of lean meats such as fish and white chicken    *small portions of whole grains  3)Get at least 150 minutes of sweaty aerobic exercise per week.  4)Reduce stress - consider counseling, meditation and relaxation to balance other aspects of your life.

## 2016-12-17 ENCOUNTER — Other Ambulatory Visit: Payer: Self-pay | Admitting: Family Medicine

## 2016-12-17 DIAGNOSIS — I10 Essential (primary) hypertension: Secondary | ICD-10-CM

## 2017-01-03 ENCOUNTER — Other Ambulatory Visit: Payer: Managed Care, Other (non HMO)

## 2017-01-16 ENCOUNTER — Other Ambulatory Visit (INDEPENDENT_AMBULATORY_CARE_PROVIDER_SITE_OTHER): Payer: Managed Care, Other (non HMO)

## 2017-01-16 DIAGNOSIS — E785 Hyperlipidemia, unspecified: Secondary | ICD-10-CM | POA: Diagnosis not present

## 2017-01-16 DIAGNOSIS — I10 Essential (primary) hypertension: Secondary | ICD-10-CM

## 2017-01-16 DIAGNOSIS — E119 Type 2 diabetes mellitus without complications: Secondary | ICD-10-CM

## 2017-01-16 LAB — BASIC METABOLIC PANEL
BUN: 20 mg/dL (ref 6–23)
CO2: 33 mEq/L — ABNORMAL HIGH (ref 19–32)
Calcium: 9.5 mg/dL (ref 8.4–10.5)
Chloride: 105 mEq/L (ref 96–112)
Creatinine, Ser: 1.07 mg/dL (ref 0.40–1.50)
GFR: 91.59 mL/min (ref >60.00)
Glucose, Bld: 96 mg/dL (ref 70–99)
POTASSIUM: 3.8 meq/L (ref 3.5–5.1)
Sodium: 145 mEq/L (ref 135–145)

## 2017-01-16 LAB — CBC
HCT: 41 % (ref 39.0–52.0)
HEMOGLOBIN: 13.7 g/dL (ref 13.0–17.0)
MCHC: 33.5 g/dL (ref 30.0–36.0)
MCV: 90.8 fl (ref 78.0–100.0)
PLATELETS: 188 10*3/uL (ref 150.0–400.0)
RBC: 4.52 Mil/uL (ref 4.22–5.81)
RDW: 14.4 % (ref 11.5–15.5)
WBC: 4.6 10*3/uL (ref 4.0–10.5)

## 2017-01-16 LAB — LIPID PANEL
CHOL/HDL RATIO: 4
CHOLESTEROL: 217 mg/dL — AB (ref 0–200)
HDL: 55.6 mg/dL (ref >39.00)
LDL CALC: 151 mg/dL — AB (ref 0–99)
NonHDL: 161.89
Triglycerides: 56 mg/dL (ref 0.0–149.0)
VLDL: 11.2 mg/dL (ref 0.0–40.0)

## 2017-01-16 LAB — HEMOGLOBIN A1C: Hgb A1c MFr Bld: 6.3 % (ref 4.6–6.5)

## 2017-01-21 ENCOUNTER — Telehealth: Payer: Self-pay | Admitting: Family Medicine

## 2017-01-21 MED ORDER — ROSUVASTATIN CALCIUM 20 MG PO TABS: 20.0000 mg | ORAL_TABLET | Freq: Every day | ORAL | 3 refills | Status: DC

## 2017-01-21 NOTE — Telephone Encounter (Signed)
See results note. 

## 2017-01-21 NOTE — Telephone Encounter (Signed)
Pt states that he received a VM from Wendie Simmer and is calling back.  I do not see notes and hope to not be overlooking them.  Patient can be reached at [phone].

## 2017-04-04 ENCOUNTER — Ambulatory Visit (INDEPENDENT_AMBULATORY_CARE_PROVIDER_SITE_OTHER): Payer: Managed Care, Other (non HMO) | Admitting: Family Medicine

## 2017-04-04 ENCOUNTER — Encounter: Payer: Self-pay | Admitting: Family Medicine

## 2017-04-04 VITALS — BP 116/82 | HR 73 | Temp 98.2°F | Ht 70.25 in | Wt 225.5 lb

## 2017-04-04 DIAGNOSIS — E119 Type 2 diabetes mellitus without complications: Secondary | ICD-10-CM | POA: Diagnosis not present

## 2017-04-04 DIAGNOSIS — E785 Hyperlipidemia, unspecified: Secondary | ICD-10-CM

## 2017-04-04 DIAGNOSIS — I10 Essential (primary) hypertension: Secondary | ICD-10-CM | POA: Diagnosis not present

## 2017-04-04 DIAGNOSIS — F17200 Nicotine dependence, unspecified, uncomplicated: Secondary | ICD-10-CM | POA: Diagnosis not present

## 2017-04-04 NOTE — Patient Instructions (Signed)
BEFORE YOU LEAVE: -follow up: CPE in 3 months, come fasting  I recommend and really hope you can stop smoking and stop drinking sodas.  Advise regular aerobic exercise (at least 150 minutes per week of sweaty exercise) and a healthy diet. Try to eat at least 5-9 servings of vegetables and fruits per day (not corn, potatoes or bananas.) Avoid sweets, red meat, pork, butter, fried foods, fast food, processed food, excessive dairy, eggs and coconut. Replace bad fats with good fats - fish, nuts and seeds, canola oil, olive oil.

## 2017-04-04 NOTE — Progress Notes (Signed)
HPI:  Follow up HTN, HLD, DM, Obesity. Did not take the crestor - has not tolerated statins well in the past - they cause body aches and jt pains. He has been working on a [employer] and is walking more. Smoking 1/3ppd. Enjoys as a habit at work to socialize. Feels can quit easily if sets his mind to it and stops buying cigs. Granddaughter and health risks motivate him.  ROS: See pertinent positives and negatives per HPI.  Past Medical History:  Diagnosis Date  . DDD (degenerative disc disease), lumbar    per his report  . Diabetes mellitus without complication ([place])   . Gout 04/16/2013  . Hyperlipidemia   . Hypertension   . IBD (inflammatory bowel disease) 04/05/2016  . Tobacco use disorder 03/28/2015    Past Surgical History:  Procedure Laterality Date  . COLONOSCOPY    . TONSILLECTOMY      Family History  Problem Relation Age of Onset  . Hypertension Father   . Diabetes Father   . Stroke Father     Social History   Social History  . Marital status: Married    Spouse name: N/A  . Number of children: N/A  . Years of education: N/A   Social History Main Topics  . Smoking status: Current Every Day Smoker    Types: Cigarettes  . Smokeless tobacco: Never Used     Comment: per patient 5 cigarettes a day   . Alcohol use Yes     Comment: occasional  . Drug use: No  . Sexual activity: Not Asked   Other Topics Concern  . 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  amLODipine (NORVASC) 10 MG tablet, TAKE ONE TABLET BY MOUTH ONCE DAILY, Disp: 90 tablet, Rfl: 1 .  losartan-hydrochlorothiazide (HYZAAR) 100-25 MG tablet, TAKE ONE TABLET BY MOUTH ONCE DAILY, Disp: 90 tablet, Rfl: 0  EXAM:  Vitals:   04/04/17 1508  BP: 116/82  Pulse: 73  Temp:  98.2 F (36.8 C)    Body mass index is 32.13 kg/m.  GENERAL: vitals reviewed and listed above, alert, oriented, appears well hydrated and in no acute distress  HEENT: atraumatic, conjunttiva clear, no obvious abnormalities on inspection of external nose and ears  NECK: no obvious masses on inspection  LUNGS: clear to auscultation bilaterally, no wheezes, rales or rhonchi, good air movement  CV: HRRR, no peripheral edema  MS: moves all extremities without noticeable abnormality  PSYCH: pleasant and cooperative, no obvious depression or anxiety  ASSESSMENT AND PLAN:  Discussed the following assessment and plan:  Diet-controlled type 2 diabetes mellitus (H[facility])  Essential hypertension, benign  Tobacco use disorder  Hyperlipidemia, unspecified hyperlipidemia type  -congratulated on lifestyle changes, assessed current trends in diet and advise quitting sodas, healthy diet and regular aerobic exercise -tobacco cessation counseling for about 5 minutes, advised to quit and offered help, he feels he can quit and makes me a promise to quit before his next visit -does not like medications, wants to try to get off medications - advised against this at this this time and advised if serious about this then for better chance good health needs to quit smoking,  eat super clean and get regular exercise -CPE in 3-4 months -Patient advised to return or notify a doctor immediately if symptoms worsen or persist or new concerns arise.  Patient Instructions  BEFORE YOU LEAVE: -follow up: CPE in 3 months, come fasting  I recommend and really hope you can stop smoking and stop drinking sodas.  Advise regular aerobic exercise (at least 150 minutes per week of sweaty exercise) and a healthy diet. Try to eat at least 5-9 servings of vegetables and fruits per day (not corn, potatoes or bananas.) Avoid sweets, red meat, pork, butter, fried foods, fast food, processed food, excessive dairy, eggs and  coconut. Replace bad fats with good fats - fish, nuts and seeds, canola oil, olive oil.      Samuel Butler R., DO

## 2017-06-13 ENCOUNTER — Other Ambulatory Visit: Payer: Self-pay | Admitting: Family Medicine

## 2017-06-13 DIAGNOSIS — I10 Essential (primary) hypertension: Secondary | ICD-10-CM

## 2017-06-18 ENCOUNTER — Encounter: Payer: Self-pay | Admitting: Family Medicine

## 2017-06-18 ENCOUNTER — Ambulatory Visit (INDEPENDENT_AMBULATORY_CARE_PROVIDER_SITE_OTHER): Payer: Managed Care, Other (non HMO) | Admitting: Family Medicine

## 2017-06-18 VITALS — BP 130/90 | HR 90 | Temp 98.3°F | Wt 227.0 lb

## 2017-06-18 DIAGNOSIS — M109 Gout, unspecified: Secondary | ICD-10-CM | POA: Diagnosis not present

## 2017-06-18 MED ORDER — PREDNISONE 20 MG PO TABS: ORAL_TABLET | ORAL | 0 refills | Status: DC

## 2017-06-18 NOTE — Patient Instructions (Signed)

## 2017-06-18 NOTE — Progress Notes (Signed)
Subjective:     Patient ID: Samuel Butler, male   DOB: 18-Jul-1961, 56 y.o.   MRN: [id number]  HPI Patient seen for acute visit with right wrist pain. He woke up with pain yesterday. He's had similar occurrence in the past with presumed gout. No recent injury. He's noticed some warmth and swelling. He is right-hand dominant. Has pain with simple movements. He had uric acid back in 2014 8.1. He states that he generally has a similar flareup about every 6 months-always involving the wrist. No history of foot or ankle involvement. He does have mild hyperglycemia with recent A1c 6.3%. Does not take any diabetes medications. He is on losartan HCTZ for hypertension as well as amlodipine. Denies any other arthralgias  Past Medical History:  Diagnosis Date  . DDD (degenerative disc disease), lumbar    per his report  . Diabetes mellitus without complication ([place])   . Gout 04/16/2013  . Hyperlipidemia   . Hypertension   . IBD (inflammatory bowel disease) 04/05/2016  . Tobacco use disorder 03/28/2015   Past Surgical History:  Procedure Laterality Date  . COLONOSCOPY    . TONSILLECTOMY      reports that he has been smoking Cigarettes.  He has never used smokeless tobacco. He reports that he drinks alcohol. He reports that he does not use drugs. family history includes Diabetes in his father; Hypertension in his father; Stroke in his father. No Known Allergies   Review of Systems  Constitutional: Negative for chills and fever.  Skin: Negative for rash.  Neurological: Negative for weakness.       Objective:   Physical Exam  Constitutional: He appears well-developed and well-nourished.  Cardiovascular: Normal rate and regular rhythm.   Pulmonary/Chest: Effort normal and breath sounds normal. No respiratory distress. He has no wheezes. He has no rales.  Musculoskeletal:  Right wrist reveals some edema over the joint area with very faint erythema and tenderness to palpation. Mild warmth. Pain  with motion in any direction. Generalized tenderness       Assessment:     Acute monoarthritis right wrist. Very likely gout with history of similar flareups in the past.  No recent injury and a low suspicion for any infection.    Plan:     -Prednisone 20 mg 2 tablets once daily for 5 days -Follow-up with primary if pain not resolving over the next few days -Stay well-hydrated -Discussed dietary triggers for gout -Consider discussion with primary at follow-up whether to look at alternatives to HCTZ which could be exacerbating -He is not interested in considering prophylaxis at this time -He is aware prednisone may elevate blood sugars transiently. Avoid high glycemic foods especially over the next week  Eulas Post MD [facility]

## 2017-06-23 ENCOUNTER — Encounter: Payer: Self-pay | Admitting: Family Medicine

## 2017-06-23 ENCOUNTER — Ambulatory Visit (INDEPENDENT_AMBULATORY_CARE_PROVIDER_SITE_OTHER): Payer: Managed Care, Other (non HMO) | Admitting: Family Medicine

## 2017-06-23 VITALS — BP 118/88 | HR 84 | Temp 99.3°F | Ht 70.25 in | Wt 232.3 lb

## 2017-06-23 DIAGNOSIS — M25532 Pain in left wrist: Secondary | ICD-10-CM

## 2017-06-23 DIAGNOSIS — M109 Gout, unspecified: Secondary | ICD-10-CM | POA: Diagnosis not present

## 2017-06-23 DIAGNOSIS — M25561 Pain in right knee: Secondary | ICD-10-CM

## 2017-06-23 DIAGNOSIS — I10 Essential (primary) hypertension: Secondary | ICD-10-CM

## 2017-06-23 DIAGNOSIS — M2550 Pain in unspecified joint: Secondary | ICD-10-CM | POA: Diagnosis not present

## 2017-06-23 MED ORDER — ALLOPURINOL 100 MG PO TABS: 100.0000 mg | ORAL_TABLET | Freq: Every day | ORAL | 1 refills | Status: DC

## 2017-06-23 MED ORDER — LOSARTAN POTASSIUM 100 MG PO TABS: 100.0000 mg | ORAL_TABLET | Freq: Every day | ORAL | 3 refills | Status: DC

## 2017-06-23 MED ORDER — PREDNISONE 10 MG PO TABS: ORAL_TABLET | ORAL | 0 refills | Status: DC

## 2017-06-23 MED ORDER — CARVEDILOL 3.125 MG PO TABS: 3.1250 mg | ORAL_TABLET | Freq: Two times a day (BID) | ORAL | 3 refills | Status: DC

## 2017-06-23 NOTE — Patient Instructions (Addendum)
BEFORE YOU LEAVE: -xray sheet -follow up: 1 month  Get the xrays of the Left wrist and the Right knee  STOP the losartan-hctz combination.  START losartan 100mg  alone and coreg 3.125 twice daily for the blood pressure.  Finish the prednisone you have, then do the 5 day taper of the lower dose of prednisone for the joint issues.  In two weeks start the allopurinol and take once daily for gout.  We placed a referral for you as discussed to the rheumatologist about the various joint pains. It usually takes about 1-2 weeks to process and schedule this referral. If you have not heard from [place] regarding this appointment in 2 weeks please contact our office.  Please go to the walk in orthopedic clinic or follow up in the interim if worsening or new concerns.

## 2017-06-23 NOTE — Progress Notes (Signed)
HPI:  Polyarthralgia: -seen by my colleague for pain in the R wrist last week -treated with prednisone -reports this improved significantly -however, report hit L wrist on frame a few weeks ago and it swelled up and has been sore intermittently and is sore today -also has R knee pain and swelling intermittently for 2 years -wonders if this is all gout -wonders if he should stop his hctz -no fevers, malaise, rash  ROS: See pertinent positives and negatives per HPI.  Past Medical History:  Diagnosis Date  . DDD (degenerative disc disease), lumbar    per his report  . Diabetes mellitus without complication ([place])   . Gout 04/16/2013  . Hyperlipidemia   . Hypertension   . IBD (inflammatory bowel disease) 04/05/2016  . Tobacco use disorder 03/28/2015    Past Surgical History:  Procedure Laterality Date  . COLONOSCOPY    . TONSILLECTOMY      Family History  Problem Relation Age of Onset  . Hypertension Father   . Diabetes Father   . Stroke Father     Social History   Social History  . Marital status: Married    Spouse name: N/A  . Number of children: N/A  . Years of education: N/A   Social History Main Topics  . Smoking status: Current Every Day Smoker    Types: Cigarettes  . Smokeless tobacco: Never Used     Comment: per patient 5 cigarettes a day   . Alcohol use Yes     Comment: occasional  . Drug use: No  . Sexual activity: Not Asked   Other Topics Concern  . 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  amLODipine (NORVASC) 10 MG tablet, TAKE ONE TABLET BY MOUTH ONCE DAILY, Disp: 90 tablet, Rfl: 0 .  allopurinol (ZYLOPRIM) 100 MG tablet, Take 1 tablet (100 mg total) by mouth daily., Disp: 30 tablet, Rfl: 1 .  carvedilol (COREG) 3.125 MG  tablet, Take 1 tablet (3.125 mg total) by mouth 2 (two) times daily with a meal., Disp: 60 tablet, Rfl: 3 .  losartan (COZAAR) 100 MG tablet, Take 1 tablet (100 mg total) by mouth daily., Disp: 90 tablet, Rfl: 3 .  predniSONE (DELTASONE) 10 MG tablet, 20mg  daily for 2 days, the 10mg  daily for 3 days, Disp: 7 tablet, Rfl: 0  EXAM:  Vitals:   06/23/17 1604  BP: 118/88  Pulse: 84  Temp: 99.3 F (37.4 C)    Body mass index is 33.09 kg/m.  GENERAL: vitals reviewed and listed above, alert, oriented, appears well hydrated and in no acute distress  HEENT: atraumatic, conjunttiva clear, no obvious abnormalities on inspection of external nose and ears  NECK: no obvious masses on inspection  LUNGS: clear to auscultation bilaterally, no wheezes, rales or rhonchi, good air movement  CV: HRRR, no peripheral edema  MS: moves all extremities without noticeable abnormality, mild swelling and TTP around R wrist, TTP L wrist over distal radius, some mild swelling R knee with P patellar crepitus and medial jt line TTP, neg lachman/drawer, val/var stress/mcmurry and NV intact distal  PSYCH: pleasant and cooperative, no obvious depression or anxiety  ASSESSMENT AND PLAN:  Discussed the following assessment and plan:  Polyarthralgia - Plan: Ambulatory referral to Rheumatology  Essential hypertension, benign  Acute gout of right wrist, unspecified cause  Left wrist pain - Plan: DG Wrist Complete Left  Recurrent pain of right knee - Plan: DG Knee Complete 4 Views Right  -we discussed possible serious and likely etiologies, workup and treatment, treatment risks and return precautions - query OA vs gout vs combo of both as most likely cause joint pains vs other -after this discussion, Donaciano opted for stopping hctz - after discussion other options for BP and risks opted for low dose coreg to replace, also will do a taper off the prednisone over the next 5 days, referral to rheum, plain films L  wrist to exclude fx, plain films R knee for suspected OA -also discussed starting allopurinol - he opted to start low dose in 2 weeks pending rheum eval -follow up advised in 1 month -we talked about the ortho urgent care options if worsening or not improving for possible tap to further help determine etiology and tx -of course, we advised Cameo  to return or notify a doctor immediately if symptoms worsen or persist or new concerns arise.   Patient Instructions  BEFORE YOU LEAVE: -xray sheet -follow up: 1 month  Get the xrays of the Left wrist and the Right knee  STOP the losartan-hctz combination.  START losartan 100mg  alone and coreg 3.125 twice daily for the blood pressure.  Finish the prednisone you have, then do the 5 day taper of the lower dose of prednisone for the joint issues.  In two weeks start the allopurinol and take once daily for gout.  We placed a referral for you as discussed to the rheumatologist about the various joint pains. It usually takes about 1-2 weeks to process and schedule this referral. If you have not heard from [place] regarding this appointment in 2 weeks please contact our office.  Please go to the walk in orthopedic clinic or follow up in the interim if worsening or new concerns.     Colin Benton R., DO

## 2017-07-31 ENCOUNTER — Encounter: Payer: Self-pay | Admitting: Family Medicine

## 2017-11-20 ENCOUNTER — Encounter: Payer: Self-pay | Admitting: Family Medicine

## 2018-02-10 ENCOUNTER — Encounter: Payer: Self-pay | Admitting: Family Medicine

## 2018-02-10 ENCOUNTER — Ambulatory Visit (INDEPENDENT_AMBULATORY_CARE_PROVIDER_SITE_OTHER): Payer: Managed Care, Other (non HMO) | Admitting: Family Medicine

## 2018-02-10 VITALS — BP 150/110 | HR 81 | Temp 98.6°F | Ht 70.75 in | Wt 228.2 lb

## 2018-02-10 DIAGNOSIS — E1159 Type 2 diabetes mellitus with other circulatory complications: Secondary | ICD-10-CM | POA: Diagnosis not present

## 2018-02-10 DIAGNOSIS — Z720 Tobacco use: Secondary | ICD-10-CM | POA: Diagnosis not present

## 2018-02-10 DIAGNOSIS — I10 Essential (primary) hypertension: Secondary | ICD-10-CM

## 2018-02-10 DIAGNOSIS — Z0000 Encounter for general adult medical examination without abnormal findings: Secondary | ICD-10-CM

## 2018-02-10 DIAGNOSIS — M109 Gout, unspecified: Secondary | ICD-10-CM

## 2018-02-10 DIAGNOSIS — E119 Type 2 diabetes mellitus without complications: Secondary | ICD-10-CM | POA: Diagnosis not present

## 2018-02-10 DIAGNOSIS — Z1159 Encounter for screening for other viral diseases: Secondary | ICD-10-CM

## 2018-02-10 MED ORDER — LOSARTAN POTASSIUM 100 MG PO TABS: 100.0000 mg | ORAL_TABLET | Freq: Every day | ORAL | 3 refills | Status: DC

## 2018-02-10 MED ORDER — CARVEDILOL 3.125 MG PO TABS: 3.1250 mg | ORAL_TABLET | Freq: Two times a day (BID) | ORAL | 3 refills | Status: DC

## 2018-02-10 MED ORDER — AMLODIPINE BESYLATE 10 MG PO TABS: 10.0000 mg | ORAL_TABLET | Freq: Every day | ORAL | 3 refills | Status: DC

## 2018-02-10 NOTE — Progress Notes (Addendum)
HPI:  Using dictation device. Unfortunately this device frequently misinterprets words/phrases.  Here for CPE:  -Concerns and/or follow up today:   Doing okay for the most part.  However he has had much more intense increased stress at work.  He is trying to cut back on work hours and manage this.  His wife also has had a lot of stress with her job.  This is impact their sexual health.  They are working to make some changes.  He is past due for his lab work and follow-up of his blood pressure and diabetes.  He stopped his allopurinol a while back.  He has not had any problems and wonders if he can continue off this medication.  He is only taking 2 blood pressure medications.  He is not sure which ones he is taking. Denies headaches, chest pain, joint swelling or redness, swelling or palpitations. -Diet: variety of foods, balance and well rounded, larger portion sizes -Exercise: Reports he gets over 10,000 steps daily at his job -Diabetes and Dyslipidemia Screening: Fasting for lab work -Hx of HTN: yes -Vaccines:  declines pneumonia vaccine -sexual activity: yes, male partner, no new partners -wants STI testing, Hep C screening (if born 38-1966): Agrees to hepatitis C screening -FH colon or prstate ca: see FH Last colon cancer screening: Up-to-date per epic  Last prostate ca screening: Discussed risks benefits of various options and he declined today -Alcohol, Tobacco, drug use: see social history  Review of Systems - no fevers, unintentional weight loss, vision loss, hearing loss, chest pain, sob, hemoptysis, melena, hematochezia, hematuria, genital discharge, changing or concerning skin lesions, bleeding, bruising, loc, thoughts of self harm or SI  Past Medical History:  Diagnosis Date  . DDD (degenerative disc disease), lumbar    per his report  . Diabetes mellitus without complication ([place])   . Gout 04/16/2013  . Hyperlipidemia   . Hypertension   . IBD (inflammatory bowel  disease) 04/05/2016  . Tobacco use disorder 03/28/2015    Past Surgical History:  Procedure Laterality Date  . COLONOSCOPY    . TONSILLECTOMY      Family History  Problem Relation Age of Onset  . Hypertension Father   . Diabetes Father   . Stroke Father     Social History   Socioeconomic History  . Marital status: Married    Spouse name: Not on file  . Number of children: Not on file  . Years of education: Not on file  . Highest education level: Not on file  Occupational History  . Not on file  Social Needs  . Financial resource strain: Not on file  . Food insecurity:    Worry: Not on file    Inability: Not on file  . Transportation needs:    Medical: Not on file    Non-medical: Not on file  Tobacco Use  . Smoking status: Current Every Day Smoker    Types: Cigarettes  . Smokeless tobacco: Never Used  . Tobacco comment: per patient 5 cigarettes a day   Substance and Sexual Activity  . Alcohol use: Yes    Comment: occasional  . Drug use: No  . Sexual activity: Not on file  Lifestyle  . Physical activity:    Days per week: Not on file    Minutes per session: Not on file  . Stress: Not on file  Relationships  . Social connections:    Talks on phone: Not on file    Gets together: Not on  file    Attends religious service: Not on file    Active member of club or organization: Not on file    Attends meetings of clubs or organizations: Not on file    Relationship status: Not on file  Other Topics Concern  . 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  amLODipine (NORVASC) 10 MG tablet, TAKE ONE TABLET BY MOUTH ONCE DAILY, Disp: 90 tablet, Rfl: 0 .  carvedilol (COREG) 3.125 MG tablet, Take 1 tablet (3.125 mg total) by mouth 2 (two) times  daily with a meal., Disp: 60 tablet, Rfl: 3 .  losartan (COZAAR) 100 MG tablet, Take 1 tablet (100 mg total) by mouth daily., Disp: 90 tablet, Rfl: 3  EXAM:  Vitals:   02/10/18 1520 02/10/18 1527  BP: (!) 144/102 (S) (!) 150/110  Pulse: 81   Temp: 98.6 F (37 C)   TempSrc: Oral   Weight: 228 lb 3.2 oz (103.5 kg)   Height: 5' 10.75" (1.797 m)     Estimated body mass index is 32.05 kg/m as calculated from the following:   Height as of this encounter: 5' 10.75" (1.797 m).   Weight as of this encounter: 228 lb 3.2 oz (103.5 kg).  GENERAL: vitals reviewed and listed below, alert, oriented, appears well hydrated and in no acute distress  HEENT: head atraumatic, PERRLA, normal appearance of eyes, ears, nose and mouth. moist mucus membranes.  NECK: supple, no masses or lymphadenopathy  LUNGS: clear to auscultation bilaterally, no rales, rhonchi or wheeze  CV: HRRR, no peripheral edema or cyanosis, normal pedal pulses  ABDOMEN: bowel sounds normal, soft, non tender to palpation, no masses, no rebound or guarding  GU: Declined  SKIN: no rash or abnormal lesions  MS: normal gait, moves all extremities normally  NEURO: normal gait, speech and thought processing grossly intact, muscle tone grossly intact throughout  PSYCH: normal affect, pleasant and cooperative  ASSESSMENT AND PLAN:  Discussed the following assessment and plan:  PREVENTIVE EXAM: -Discussed and advised all [place] preventive services health task force level A and B recommendations for age, sex and risks. -Advised at least 150 minutes of exercise per week and a healthy diet with avoidance of (less then 1 serving per week) processed foods, white starches, red meat, fast foods and sweets and consisting of: * 5-9 servings of fresh fruits and vegetables (not corn or potatoes) *nuts and seeds, beans *olives and olive oil *lean meats such as fish and white chicken  *whole grains -labs, studies and vaccines per orders  this encounter   2. Diet-controlled diabetes mellitus ([place]) - Hemoglobin A1c - Lipid panel -Foot exam done  3. Hypertension associated with diabetes ([place]) - Basic metabolic panel - CBC -Refills on all 3 medications and recommended he take all 3 daily  4. Acute gout of right wrist, unspecified cause -Check uric acid -advised he continue allopurinol, particularly if this is elevated  5. Tobacco use -Advised to quit  6. Need for hepatitis C screening test - Hepatitis C antibody   Patient Instructions  BEFORE YOU LEAVE: -Please send refills on all 3 blood pressure medications, 1 year -Labs -follow up: 1-2 months  Take  all 3 blood pressure medications, Norvasc, Coreg and losartan.  We are checking uric acid today.  I would recommend restarting your allopurinol if this is elevated.  Do not smoke.  We have ordered labs or studies at this visit. It can take up to 1-2 weeks for results and processing. IF results require follow up or explanation, we will call you with instructions. Clinically stable results will be released to your [facility]. If you have not heard from [place] or cannot find your results in [facility] in 2 weeks please contact our office at [phone].  If you are not yet signed up for [facility], please consider signing up.   Preventive Care 40-64 Years, Male Preventive care refers to lifestyle choices and visits with your health care provider that can promote health and wellness. What does preventive care include?  A yearly physical exam. This is also called an annual well check.  Dental exams once or twice a year.  Routine eye exams. Ask your health care provider how often you should have your eyes checked.  Personal lifestyle choices, including: ? Daily care of your teeth and gums. ? Regular physical activity. ? Eating a healthy diet. ? Avoiding tobacco and drug use. ? Limiting alcohol use. ? Practicing safe sex. ? Taking low-dose aspirin every day starting at  age 2. What happens during an annual well check? The services and screenings done by your health care provider during your annual well check will depend on your age, overall health, lifestyle risk factors, and family history of disease. Counseling Your health care provider may ask you questions about your:  Alcohol use.  Tobacco use.  Drug use.  Emotional well-being.  Home and relationship well-being.  Sexual activity.  Eating habits.  Work and work Statistician.  Screening You may have the following tests or measurements:  Height, weight, and BMI.  Blood pressure.  Lipid and cholesterol levels. These may be checked every 5 years, or more frequently if you are over 80 years old.  Skin check.  Lung cancer screening. You may have this screening every year starting at age 29 if you have a 30-pack-year history of smoking and currently smoke or have quit within the past 15 years.  Fecal occult blood test (FOBT) of the stool. You may have this test every year starting at age 62.  Flexible sigmoidoscopy or colonoscopy. You may have a sigmoidoscopy every 5 years or a colonoscopy every 10 years starting at age 23.  Prostate cancer screening. Recommendations will vary depending on your family history and other risks.  Hepatitis C blood test.  Hepatitis B blood test.  Sexually transmitted disease (STD) testing.  Diabetes screening. This is done by checking your blood sugar (glucose) after you have not eaten for a while (fasting). You may have this done every 1-3 years.  Discuss your test results, treatment options, and if necessary, the need for more tests with your health care provider. Vaccines Your health care provider may recommend certain vaccines, such as:  Influenza vaccine. This is recommended every year.  Tetanus, diphtheria, and acellular pertussis (Tdap, Td) vaccine. You may need a Td booster every 10 years.  Varicella vaccine. You may need this if you have  not been vaccinated.  Zoster vaccine. You may need this after age 17.  Measles, mumps, and rubella (MMR) vaccine. You may need at least one dose of MMR if you were born in 1957 or later. You may also need a second dose.  Pneumococcal 13-valent conjugate (PCV13)  vaccine. You may need this if you have certain conditions and have not been vaccinated.  Pneumococcal polysaccharide (PPSV23) vaccine. You may need one or two doses if you smoke cigarettes or if you have certain conditions.  Meningococcal vaccine. You may need this if you have certain conditions.  Hepatitis A vaccine. You may need this if you have certain conditions or if you travel or work in places where you may be exposed to hepatitis A.  Hepatitis B vaccine. You may need this if you have certain conditions or if you travel or work in places where you may be exposed to hepatitis B.  Haemophilus influenzae type b (Hib) vaccine. You may need this if you have certain risk factors.  Talk to your health care provider about which screenings and vaccines you need and how often you need them. This information is not intended to replace advice given to you by your health care provider. Make sure you discuss any questions you have with your health care provider. Document Released: 11/24/2015 Document Revised: 07/17/2016 Document Reviewed: 08/29/2015 Elsevier Interactive Patient Education  2018 [employer].          No follow-ups on file.   Lucretia Kern, DO

## 2018-02-10 NOTE — Addendum Note (Signed)
Addended by: Agnes Lawrence on: 02/10/2018 05:44 PM   Modules accepted: Orders

## 2018-02-10 NOTE — Patient Instructions (Addendum)
BEFORE YOU LEAVE: -Please send refills on all 3 blood pressure medications, 1 year -Labs -follow up: 1-2 months  Take all 3 blood pressure medications, Norvasc, Coreg and losartan.  We are checking uric acid today.  I would recommend restarting your allopurinol if this is elevated.  Do not smoke.  We have ordered labs or studies at this visit. It can take up to 1-2 weeks for results and processing. IF results require follow up or explanation, we will call you with instructions. Clinically stable results will be released to your [facility]. If you have not heard from [place] or cannot find your results in [facility] in 2 weeks please contact our office at [phone].  If you are not yet signed up for [facility], please consider signing up.   Preventive Care 40-64 Years, Male Preventive care refers to lifestyle choices and visits with your health care provider that can promote health and wellness. What does preventive care include?  A yearly physical exam. This is also called an annual well check.  Dental exams once or twice a year.  Routine eye exams. Ask your health care provider how often you should have your eyes checked.  Personal lifestyle choices, including: ? Daily care of your teeth and gums. ? Regular physical activity. ? Eating a healthy diet. ? Avoiding tobacco and drug use. ? Limiting alcohol use. ? Practicing safe sex. ? Taking low-dose aspirin every day starting at age 30. What happens during an annual well check? The services and screenings done by your health care provider during your annual well check will depend on your age, overall health, lifestyle risk factors, and family history of disease. Counseling Your health care provider may ask you questions about your:  Alcohol use.  Tobacco use.  Drug use.  Emotional well-being.  Home and relationship well-being.  Sexual activity.  Eating habits.  Work and work Statistician.  Screening You may have the following  tests or measurements:  Height, weight, and BMI.  Blood pressure.  Lipid and cholesterol levels. These may be checked every 5 years, or more frequently if you are over 17 years old.  Skin check.  Lung cancer screening. You may have this screening every year starting at age 32 if you have a 30-pack-year history of smoking and currently smoke or have quit within the past 15 years.  Fecal occult blood test (FOBT) of the stool. You may have this test every year starting at age 47.  Flexible sigmoidoscopy or colonoscopy. You may have a sigmoidoscopy every 5 years or a colonoscopy every 10 years starting at age 35.  Prostate cancer screening. Recommendations will vary depending on your family history and other risks.  Hepatitis C blood test.  Hepatitis B blood test.  Sexually transmitted disease (STD) testing.  Diabetes screening. This is done by checking your blood sugar (glucose) after you have not eaten for a while (fasting). You may have this done every 1-3 years.  Discuss your test results, treatment options, and if necessary, the need for more tests with your health care provider. Vaccines Your health care provider may recommend certain vaccines, such as:  Influenza vaccine. This is recommended every year.  Tetanus, diphtheria, and acellular pertussis (Tdap, Td) vaccine. You may need a Td booster every 10 years.  Varicella vaccine. You may need this if you have not been vaccinated.  Zoster vaccine. You may need this after age 44.  Measles, mumps, and rubella (MMR) vaccine. You may need at least one dose of MMR  if you were born in 1957 or later. You may also need a second dose.  Pneumococcal 13-valent conjugate (PCV13) vaccine. You may need this if you have certain conditions and have not been vaccinated.  Pneumococcal polysaccharide (PPSV23) vaccine. You may need one or two doses if you smoke cigarettes or if you have certain conditions.  Meningococcal vaccine. You may  need this if you have certain conditions.  Hepatitis A vaccine. You may need this if you have certain conditions or if you travel or work in places where you may be exposed to hepatitis A.  Hepatitis B vaccine. You may need this if you have certain conditions or if you travel or work in places where you may be exposed to hepatitis B.  Haemophilus influenzae type b (Hib) vaccine. You may need this if you have certain risk factors.  Talk to your health care provider about which screenings and vaccines you need and how often you need them. This information is not intended to replace advice given to you by your health care provider. Make sure you discuss any questions you have with your health care provider. Document Released: 11/24/2015 Document Revised: 07/17/2016 Document Reviewed: 08/29/2015 Elsevier Interactive Patient Education  [employer].

## 2018-02-11 LAB — HM DIABETES EYE EXAM

## 2018-02-11 LAB — CBC
HCT: 41.5 % (ref 39.0–52.0)
Hemoglobin: 13.8 g/dL (ref 13.0–17.0)
MCHC: 33.2 g/dL (ref 30.0–36.0)
MCV: 90.7 fl (ref 78.0–100.0)
Platelets: 178 10*3/uL (ref 150.0–400.0)
RBC: 4.58 Mil/uL (ref 4.22–5.81)
RDW: 14.6 % (ref 11.5–15.5)
WBC: 5.2 10*3/uL (ref 4.0–10.5)

## 2018-02-11 LAB — BASIC METABOLIC PANEL
BUN: 17 mg/dL (ref 6–23)
CHLORIDE: 101 meq/L (ref 96–112)
CO2: 31 meq/L (ref 19–32)
CREATININE: 0.99 mg/dL (ref 0.40–1.50)
Calcium: 9.1 mg/dL (ref 8.4–10.5)
GFR: 99.81 mL/min (ref >60.00)
GLUCOSE: 72 mg/dL (ref 70–99)
Potassium: 3.3 mEq/L — ABNORMAL LOW (ref 3.5–5.1)
Sodium: 142 mEq/L (ref 135–145)

## 2018-02-11 LAB — URIC ACID: URIC ACID, SERUM: 7.2 mg/dL (ref 4.0–7.8)

## 2018-02-11 LAB — LIPID PANEL
Cholesterol: 224 mg/dL — ABNORMAL HIGH (ref 0–200)
HDL: 64.9 mg/dL (ref >39.00)
LDL Cholesterol: 150 mg/dL — ABNORMAL HIGH (ref 0–99)
NONHDL: 159.44
Total CHOL/HDL Ratio: 3
Triglycerides: 49 mg/dL (ref 0.0–149.0)
VLDL: 9.8 mg/dL (ref 0.0–40.0)

## 2018-02-11 LAB — HEMOGLOBIN A1C: HEMOGLOBIN A1C: 5.9 % (ref 4.6–6.5)

## 2018-02-12 LAB — HEPATITIS C ANTIBODY
HEP C AB: NONREACTIVE
SIGNAL TO CUT-OFF: 0.02 (ref <1.00)

## 2018-02-25 ENCOUNTER — Telehealth: Payer: Self-pay | Admitting: Family Medicine

## 2018-02-25 NOTE — Telephone Encounter (Signed)
Needs appt. His uric acid was normal. He was supposed to see rheumatologist last year about his joint issues but he did not go to the appt. I think he should see rheum to figure out the cause of his jt issues. appt her here in the interim if acute issues. Thanks.

## 2018-02-25 NOTE — Telephone Encounter (Signed)
Copied from [place] [id number]. Topic: Quick Communication - See Telephone Encounter >> Feb 25, 2018 12:50 PM Bea Graff, NT wrote: CRM for notification. See Telephone encounter for: 02/25/18. Pt would like to see if he can have medicine called in for gout in his wrist. [place], [place] - [place] [address] [phone] (Phone) [phone] (Fax)

## 2018-02-26 ENCOUNTER — Encounter: Payer: Self-pay | Admitting: Family Medicine

## 2018-02-26 ENCOUNTER — Ambulatory Visit (INDEPENDENT_AMBULATORY_CARE_PROVIDER_SITE_OTHER)
Admission: RE | Admit: 2018-02-26 | Discharge: 2018-02-26 | Disposition: A | Discharge status: Home / Self Care | Payer: Managed Care, Other (non HMO) | Source: Ambulatory Visit | Attending: Family Medicine | Admitting: Family Medicine

## 2018-02-26 ENCOUNTER — Ambulatory Visit: Payer: Managed Care, Other (non HMO) | Admitting: Family Medicine

## 2018-02-26 VITALS — BP 128/70 | HR 77 | Temp 98.3°F | Ht 70.75 in | Wt 224.1 lb

## 2018-02-26 DIAGNOSIS — M25532 Pain in left wrist: Secondary | ICD-10-CM | POA: Diagnosis not present

## 2018-02-26 LAB — SEDIMENTATION RATE: SED RATE: 25 mm/h — AB (ref 0–20)

## 2018-02-26 LAB — URIC ACID: URIC ACID, SERUM: 5.7 mg/dL (ref 4.0–7.8)

## 2018-02-26 NOTE — Patient Instructions (Addendum)
BEFORE YOU LEAVE: -labs -number for rheumatologist we referred him to before to reschedule -xray sheet -follow up: as scheduled  Go get xray  Call rheumatologist for evaluation  Can use ice and limited aleve per instructions for next 1 week  I hope you are feeling better soon! Seek care promptly if your symptoms worsen, new concerns arise or you are not improving with treatment.

## 2018-02-26 NOTE — Telephone Encounter (Signed)
I left a detailed message with the information below at the pts cell number. 

## 2018-02-26 NOTE — Progress Notes (Addendum)
  HPI:  Using dictation device. Unfortunately this device frequently misinterprets words/phrases.  Acute visit for L wrist swelling: -started acutely 3 days ago -pain/swelling L wrist -improving with aleve per his report -denies fevers, malaise, other swollen or painful joints, rash -he has had several episodes of swollen joints, -he did not get xray or see rheumatology about this when advised in the past -uric acid neg last visit -hx ibd, refuses monitoring or GI referral for this, reports no sytmpoms, denies hematochezia, melena, or other symptoms   ROS: See pertinent positives and negatives per HPI.  Past Medical History:  Diagnosis Date  . DDD (degenerative disc disease), lumbar    per his report  . Diabetes mellitus without complication ([place])   . Gout 04/16/2013  . Hyperlipidemia   . Hypertension   . IBD (inflammatory bowel disease) 04/05/2016  . Tobacco use disorder 03/28/2015    Past Surgical History:  Procedure Laterality Date  . COLONOSCOPY    . TONSILLECTOMY      Family History  Problem Relation Age of Onset  . Hypertension Father   . Diabetes Father   . Stroke Father     SOCIAL HX: see above   Current Outpatient Medications:  .  amLODipine (NORVASC) 10 MG tablet, Take 1 tablet (10 mg total) by mouth daily., Disp: 90 tablet, Rfl: 3 .  carvedilol (COREG) 3.125 MG tablet, Take 1 tablet (3.125 mg total) by mouth 2 (two) times daily with a meal., Disp: 180 tablet, Rfl: 3 .  losartan (COZAAR) 100 MG tablet, Take 1 tablet (100 mg total) by mouth daily., Disp: 90 tablet, Rfl: 3  EXAM:  Vitals:   02/26/18 0829  BP: 128/70  Pulse: 77  Temp: 98.3 F (36.8 C)    Body mass index is 31.48 kg/m.  GENERAL: vitals reviewed and listed above, alert, oriented, appears well hydrated and in no acute distress  HEENT: atraumatic, conjunttiva clear, no obvious abnormalities on inspection of external nose and ears  NECK: no obvious masses on inspection  LUNGS: clear  to auscultation bilaterally, no wheezes, rales or rhonchi, good air movement  CV: HRRR, no peripheral edema  MS: moves all extremities without noticeable abnormality  PSYCH: pleasant and cooperative, no obvious depression or anxiety  ASSESSMENT AND PLAN:  Discussed the following assessment and plan:  Arthralgia of left wrist - Plan: Uric Acid, Rheumatoid Factor, Cyclic citrul peptide antibody, IgG, Sedimentation Rate, DG Wrist Complete Left  -xray -labs -he did not go to prior rheum eval appt - advised to reschedule to assist in making dx, not sure if this is gout -given aleve is working well opted to continue for a few days, discussed risks vs prednisone -advise again to see GI for surveillance IBD, he refused, denies symptoms (could cloud picture in terms of inflam lab results) -advised healthy lifestyle -Patient advised to return or notify a doctor immediately if symptoms worsen or persist or new concerns arise.  Patient Instructions  BEFORE YOU LEAVE: -labs -number for rheumatologist we referred him to before to reschedule -xray sheet -follow up: as scheduled  Go get xray  Call rheumatologist for evaluation  Can use ice and limited aleve per instructions for next 1 week  I hope you are feeling better soon! Seek care promptly if your symptoms worsen, new concerns arise or you are not improving with treatment.      Lucretia Kern, DO

## 2018-02-27 LAB — CYCLIC CITRUL PEPTIDE ANTIBODY, IGG: Cyclic Citrullin Peptide Ab: <16 UNITS

## 2018-02-27 LAB — RHEUMATOID FACTOR

## 2018-04-14 ENCOUNTER — Ambulatory Visit: Payer: Managed Care, Other (non HMO) | Admitting: Family Medicine

## 2018-04-14 VITALS — BP 154/90 | HR 72 | Temp 98.8°F | Wt 220.0 lb

## 2018-04-14 DIAGNOSIS — E785 Hyperlipidemia, unspecified: Secondary | ICD-10-CM | POA: Diagnosis not present

## 2018-04-14 DIAGNOSIS — E119 Type 2 diabetes mellitus without complications: Secondary | ICD-10-CM | POA: Diagnosis not present

## 2018-04-14 DIAGNOSIS — Z720 Tobacco use: Secondary | ICD-10-CM | POA: Diagnosis not present

## 2018-04-14 DIAGNOSIS — E1159 Type 2 diabetes mellitus with other circulatory complications: Secondary | ICD-10-CM

## 2018-04-14 DIAGNOSIS — I10 Essential (primary) hypertension: Secondary | ICD-10-CM

## 2018-04-14 DIAGNOSIS — E1169 Type 2 diabetes mellitus with other specified complication: Secondary | ICD-10-CM

## 2018-04-14 NOTE — Patient Instructions (Addendum)
BEFORE YOU LEAVE: -fasting lab appointment to recheck cholesterol sometime this month -follow up: 3-4 months  Congratulations on quitting smoking!  Take all of your blood pressure medications daily.   We recommend the following healthy lifestyle for LIFE: 1) Small portions. But, make sure to get regular (at least 3 per day), healthy meals and small healthy snacks if needed.  2) Eat a healthy clean diet.   TRY TO EAT: -at least 5-7 servings of low sugar, colorful, and nutrient rich vegetables per day (not corn, potatoes or bananas.) -berries are the best choice if you wish to eat fruit (only eat small amounts if trying to reduce weight)  -lean meets (fish, white meat of chicken or [place]) -vegan proteins for some meals - beans or tofu, whole grains, nuts and seeds -Replace bad fats with good fats - good fats include: fish, nuts and seeds, canola oil, olive oil -small amounts of low fat or non fat dairy -small amounts of100 % whole grains - check the lables -drink plenty of water  AVOID: -SUGAR, sweets, anything with added sugar, corn syrup or sweeteners - must read labels as even foods advertised as "healthy" often are loaded with sugar -if you must have a sweetener, small amounts of stevia may be best -sweetened beverages and artificially sweetened beverages -simple starches (rice, bread, potatoes, pasta, chips, etc - small amounts of 100% whole grains are ok) -red meat, pork, butter -fried foods, fast food, processed food, excessive dairy, eggs and coconut.  3)Get at least 150 minutes of sweaty aerobic exercise per week.  4)Reduce stress - consider counseling, meditation and relaxation to balance other aspects of your life.

## 2018-04-14 NOTE — Progress Notes (Signed)
HPI:  Using dictation device. Unfortunately this device frequently misinterprets words/phrases.  Samuel Butler is a pleasant 57 yo here for follow up. Reports he is doing great. Quit smoking 1 month ago and feels good. No further joint issues so he did not see the rheumatologist. Did get his eye exam. Refuses to see GI for follow up of his history of IBD - denies symptoms. Ran out of his coreg today so did not take today -picking up rx today. No cp, sob, doe.   ROS: See pertinent positives and negatives per HPI.  Past Medical History:  Diagnosis Date  . DDD (degenerative disc disease), lumbar    per his report  . Diabetes mellitus without complication ([place])   . Gout 04/16/2013  . Hyperlipidemia   . Hypertension   . IBD (inflammatory bowel disease) 04/05/2016  . Tobacco use disorder 03/28/2015    Past Surgical History:  Procedure Laterality Date  . COLONOSCOPY    . TONSILLECTOMY      Family History  Problem Relation Age of Onset  . Hypertension Father   . Diabetes Father   . Stroke Father     SOCIAL HX: see hpi   Current Outpatient Medications:  .  amLODipine (NORVASC) 10 MG tablet, Take 1 tablet (10 mg total) by mouth daily., Disp: 90 tablet, Rfl: 3 .  carvedilol (COREG) 3.125 MG tablet, Take 1 tablet (3.125 mg total) by mouth 2 (two) times daily with a meal., Disp: 180 tablet, Rfl: 3 .  losartan (COZAAR) 100 MG tablet, Take 1 tablet (100 mg total) by mouth daily., Disp: 90 tablet, Rfl: 3  EXAM:  Vitals:   04/14/18 1548  BP: (!) 154/90  Pulse: 72  Temp: 98.8 F (37.1 C)    Body mass index is 30.9 kg/m.  GENERAL: vitals reviewed and listed above, alert, oriented, appears well hydrated and in no acute distress  HEENT: atraumatic, conjunttiva clear, no obvious abnormalities on inspection of external nose and ears  NECK: no obvious masses on inspection  LUNGS: clear to auscultation bilaterally, no wheezes, rales or rhonchi, good air movement  CV: HRRR, no  peripheral edema  MS: moves all extremities without noticeable abnormality  PSYCH: pleasant and cooperative, no obvious depression or anxiety  ASSESSMENT AND PLAN:  Discussed the following assessment and plan:  Tobacco use  Diet-controlled type 2 diabetes mellitus ([place])  Hypertension associated with diabetes ([place])  Hyperlipidemia associated with type 2 diabetes mellitus ([place]) - Plan: Lipid panel  -congratulated on quitting smoking -needs lipid recheck -other labs ok -not further jt issues so he opted not to see rheumatology - has OA, discussed other potential etiologies -continues to refuse GI surveillance for his hx IBD -follow up 3-4 months, sooner as needed  Patient Instructions  BEFORE YOU LEAVE: -fasting lab appointment to recheck cholesterol sometime this month -follow up: 3-4 months  Congratulations on quitting smoking!  Take all of your blood pressure medications daily.   We recommend the following healthy lifestyle for LIFE: 1) Small portions. But, make sure to get regular (at least 3 per day), healthy meals and small healthy snacks if needed.  2) Eat a healthy clean diet.   TRY TO EAT: -at least 5-7 servings of low sugar, colorful, and nutrient rich vegetables per day (not corn, potatoes or bananas.) -berries are the best choice if you wish to eat fruit (only eat small amounts if trying to reduce weight)  -lean meets (fish, white meat of chicken or [place]) -vegan proteins  for some meals - beans or tofu, whole grains, nuts and seeds -Replace bad fats with good fats - good fats include: fish, nuts and seeds, canola oil, olive oil -small amounts of low fat or non fat dairy -small amounts of100 % whole grains - check the lables -drink plenty of water  AVOID: -SUGAR, sweets, anything with added sugar, corn syrup or sweeteners - must read labels as even foods advertised as "healthy" often are loaded with sugar -if you must have a sweetener, small amounts of  stevia may be best -sweetened beverages and artificially sweetened beverages -simple starches (rice, bread, potatoes, pasta, chips, etc - small amounts of 100% whole grains are ok) -red meat, pork, butter -fried foods, fast food, processed food, excessive dairy, eggs and coconut.  3)Get at least 150 minutes of sweaty aerobic exercise per week.  4)Reduce stress - consider counseling, meditation and relaxation to balance other aspects of your life.    Lucretia Kern, DO

## 2018-04-24 ENCOUNTER — Other Ambulatory Visit (INDEPENDENT_AMBULATORY_CARE_PROVIDER_SITE_OTHER): Payer: Managed Care, Other (non HMO)

## 2018-04-24 DIAGNOSIS — E1169 Type 2 diabetes mellitus with other specified complication: Secondary | ICD-10-CM | POA: Diagnosis not present

## 2018-04-24 DIAGNOSIS — E785 Hyperlipidemia, unspecified: Secondary | ICD-10-CM | POA: Diagnosis not present

## 2018-04-24 LAB — LIPID PANEL
Cholesterol: 224 mg/dL — ABNORMAL HIGH (ref 0–200)
HDL: 57.8 mg/dL (ref >39.00)
LDL Cholesterol: 156 mg/dL — ABNORMAL HIGH (ref 0–99)
NonHDL: 166.5
TRIGLYCERIDES: 54 mg/dL (ref 0.0–149.0)
Total CHOL/HDL Ratio: 4
VLDL: 10.8 mg/dL (ref 0.0–40.0)

## 2018-07-20 ENCOUNTER — Ambulatory Visit: Payer: Managed Care, Other (non HMO) | Admitting: Family Medicine

## 2018-07-20 ENCOUNTER — Encounter: Payer: Self-pay | Admitting: Family Medicine

## 2018-07-20 VITALS — BP 128/88 | HR 72 | Temp 98.5°F | Ht 70.75 in | Wt 225.2 lb

## 2018-07-20 DIAGNOSIS — I10 Essential (primary) hypertension: Secondary | ICD-10-CM | POA: Diagnosis not present

## 2018-07-20 DIAGNOSIS — E785 Hyperlipidemia, unspecified: Secondary | ICD-10-CM | POA: Diagnosis not present

## 2018-07-20 DIAGNOSIS — H5789 Other specified disorders of eye and adnexa: Secondary | ICD-10-CM

## 2018-07-20 DIAGNOSIS — E119 Type 2 diabetes mellitus without complications: Secondary | ICD-10-CM | POA: Diagnosis not present

## 2018-07-20 LAB — CBC
HEMATOCRIT: 40.8 % (ref 39.0–52.0)
Hemoglobin: 13.7 g/dL (ref 13.0–17.0)
MCHC: 33.5 g/dL (ref 30.0–36.0)
MCV: 88.8 fl (ref 78.0–100.0)
Platelets: 199 10*3/uL (ref 150.0–400.0)
RBC: 4.6 Mil/uL (ref 4.22–5.81)
RDW: 15 % (ref 11.5–15.5)
WBC: 4.6 10*3/uL (ref 4.0–10.5)

## 2018-07-20 LAB — BASIC METABOLIC PANEL
BUN: 22 mg/dL (ref 6–23)
CHLORIDE: 105 meq/L (ref 96–112)
CO2: 27 mEq/L (ref 19–32)
Calcium: 9.1 mg/dL (ref 8.4–10.5)
Creatinine, Ser: 1.16 mg/dL (ref 0.40–1.50)
GFR: 83 mL/min (ref >60.00)
Glucose, Bld: 93 mg/dL (ref 70–99)
POTASSIUM: 3.8 meq/L (ref 3.5–5.1)
SODIUM: 140 meq/L (ref 135–145)

## 2018-07-20 MED ORDER — LOSARTAN POTASSIUM 100 MG PO TABS: 100.0000 mg | ORAL_TABLET | Freq: Every day | ORAL | 3 refills | Status: DC

## 2018-07-20 NOTE — Progress Notes (Signed)
HPI:  Using dictation device. Unfortunately this device frequently misinterprets words/phrases.  Samuel Butler is a pleasant 57 y.o. here for follow up. Chronic medical problems summarized below were reviewed for changes and stability and were updated as needed below. These issues and their treatment remain stable for the most part. Needs refill on losartan. New issues of some L eye irritation today. Does not recall any trama or anything he did today. Does have allergies. No vision changes, pain, fevers or pus. Denies CP, SOB, DOE, treatment intolerance or new symptoms. Refuses vaccines.  HTN/DM/Hyperlipidemia: -needs refill, out today of losartan -meds: amlodipine, coreg, losartan -diet controlled diabetes and cholesterol  Arthralgias: -rare now -referred to rheum in the past, he declined and opted not to go as was doing better  IBD: -refuses GI follow up  Hx tobacco use: -remains tobacco free  ROS: See pertinent positives and negatives per HPI.  Past Medical History:  Diagnosis Date  . DDD (degenerative disc disease), lumbar    per his report  . Diabetes mellitus without complication ([place])   . Gout 04/16/2013  . Hyperlipidemia   . Hypertension   . IBD (inflammatory bowel disease) 04/05/2016  . Tobacco use disorder 03/28/2015    Past Surgical History:  Procedure Laterality Date  . COLONOSCOPY    . TONSILLECTOMY      Family History  Problem Relation Age of Onset  . Hypertension Father   . Diabetes Father   . Stroke Father     SOCIAL HX: see hpi   Current Outpatient Medications:  .  amLODipine (NORVASC) 10 MG tablet, Take 1 tablet (10 mg total) by mouth daily., Disp: 90 tablet, Rfl: 3 .  carvedilol (COREG) 3.125 MG tablet, Take 1 tablet (3.125 mg total) by mouth 2 (two) times daily with a meal., Disp: 180 tablet, Rfl: 3 .  losartan (COZAAR) 100 MG tablet, Take 1 tablet (100 mg total) by mouth daily., Disp: 90 tablet, Rfl: 3  EXAM:  Vitals:   07/20/18 0854   BP: 128/88  Pulse: 72  Temp: 98.5 F (36.9 C)    Body mass index is 31.63 kg/m.  GENERAL: vitals reviewed and listed above, alert, oriented, appears well hydrated and in no acute distress  HEENT: atraumatic, conjunttiva clear, not trauma, foreign body, pus seen L eye, normal PERLA and normal EOMI,  Visual acuity grossly intact, no obvious abnormalities on inspection of external nose and ears  NECK: no obvious masses on inspection  LUNGS: clear to auscultation bilaterally, no wheezes, rales or rhonchi, good air movement  CV: HRRR, no peripheral edema  MS: moves all extremities without noticeable abnormality  PSYCH: pleasant and cooperative, no obvious depression or anxiety  ASSESSMENT AND PLAN:  Discussed the following assessment and plan:  Essential hypertension, benign - Plan: Basic metabolic panel, CBC  Diet-controlled type 2 diabetes mellitus (HCC)  Hyperlipidemia, unspecified hyperlipidemia type  Eye irritation  -lifestyle recs -refills -labs per orders -allergy regimen for eye symptoms and saline wash, advised to follow up if persists or worsens -o/w follow up in 3-4 months  Patient Instructions  BEFORE YOU LEAVE: -abstract eye exam -Wendie Simmer, please check on his prescription for losartan, looks like it was refilled for 1 year in April, but he says he is out. Can you check and reorder if needed? -labs -follow up: 3-4 months  Rinse eyes with saline eye wash 1-2 times daily.  Compresses if needed.  Allergy pill daily (allegra or claritin)  Follow up promptly if eye  issue worsens, persist or new concerns arise  We have ordered labs or studies at this visit. It can take up to 1-2 weeks for results and processing. IF results require follow up or explanation, we will call you with instructions. Clinically stable results will be released to your [facility]. If you have not heard from [place] or cannot find your results in [facility] in 2 weeks please contact our office at  [phone].  If you are not yet signed up for [facility], please consider signing up.          Lucretia Kern, DO

## 2018-07-20 NOTE — Patient Instructions (Addendum)
BEFORE YOU LEAVE: -abstract eye exam -Wendie Simmer, please check on his prescription for losartan, looks like it was refilled for 1 year in April, but he says he is out. Can you check and reorder if needed? -labs -follow up: 3-4 months  Rinse eyes with saline eye wash 1-2 times daily.  Compresses if needed.  Allergy pill daily (allegra or claritin)  Follow up promptly if eye issue worsens, persist or new concerns arise  We have ordered labs or studies at this visit. It can take up to 1-2 weeks for results and processing. IF results require follow up or explanation, we will call you with instructions. Clinically stable results will be released to your [facility]. If you have not heard from [place] or cannot find your results in [facility] in 2 weeks please contact our office at [phone].  If you are not yet signed up for [facility], please consider signing up.

## 2018-08-13 ENCOUNTER — Ambulatory Visit: Payer: Managed Care, Other (non HMO) | Admitting: Family Medicine

## 2018-11-17 ENCOUNTER — Ambulatory Visit: Payer: Managed Care, Other (non HMO) | Admitting: Family Medicine

## 2018-11-19 ENCOUNTER — Encounter: Payer: Self-pay | Admitting: Family Medicine

## 2018-11-19 ENCOUNTER — Encounter: Payer: Self-pay | Admitting: *Deleted

## 2018-11-19 ENCOUNTER — Ambulatory Visit: Payer: Managed Care, Other (non HMO) | Admitting: Family Medicine

## 2018-11-19 VITALS — BP 120/72 | HR 94 | Temp 98.5°F | Ht 70.75 in | Wt 220.3 lb

## 2018-11-19 DIAGNOSIS — J0100 Acute maxillary sinusitis, unspecified: Secondary | ICD-10-CM

## 2018-11-19 DIAGNOSIS — I10 Essential (primary) hypertension: Secondary | ICD-10-CM

## 2018-11-19 DIAGNOSIS — E119 Type 2 diabetes mellitus without complications: Secondary | ICD-10-CM

## 2018-11-19 DIAGNOSIS — M2550 Pain in unspecified joint: Secondary | ICD-10-CM

## 2018-11-19 DIAGNOSIS — J988 Other specified respiratory disorders: Secondary | ICD-10-CM

## 2018-11-19 DIAGNOSIS — R6889 Other general symptoms and signs: Secondary | ICD-10-CM

## 2018-11-19 DIAGNOSIS — E785 Hyperlipidemia, unspecified: Secondary | ICD-10-CM

## 2018-11-19 DIAGNOSIS — K529 Noninfective gastroenteritis and colitis, unspecified: Secondary | ICD-10-CM

## 2018-11-19 LAB — POC INFLUENZA A&B (BINAX/QUICKVUE)
INFLUENZA A, POC: POSITIVE — AB
INFLUENZA B, POC: NEGATIVE

## 2018-11-19 NOTE — Progress Notes (Signed)
HPI:  Using dictation device. Unfortunately this device frequently misinterprets words/phrases.  Samuel Butler is a pleasant 58 y.o. here for follow up - however he prefers to change to sick visit as has not been well. Agrees to follow up in a few weeks for labs and follow up.  New complaint of influenza. All of the family had it. He got sick about 2 weeks ago with fevers, Simms, cough, body aches, dizziness. Now doing better with fevers resolved but persistent cough, thick sputum and thick sinus drainage with sinus pressure that is not improving. Denies persistent fevers, hemoptysis, diarrhea,syncope, CP, SOB, DOE, treatment intolerance or new symptoms. Chronic medical problems summarized below were reviewed for changes and stability and were updated as needed below. These issues and their treatment remain stable for the most part. Reports joint pain ok until flu. No bowel issues. Continues to refuse further eval for the joint pains or to see GI. Understands implications, potential serious dx, but refuses.   HTN/DM/Hyperlipidemia: -meds: amlodipine, coreg, losartan -diet controlled diabetes and cholesterol -he refuses a statin, reports "bad" reaction to Crestor  Arthralgias: -rare now -referred to rheum in the past, he declined and opted not to go as was doing better  IBD: -refuses GI follow up  Hx tobacco use: -remains tobacco free  ROS: See pertinent positives and negatives per HPI.  Past Medical History:  Diagnosis Date  . DDD (degenerative disc disease), lumbar    per his report  . Diabetes mellitus without complication ([place])   . Gout 04/16/2013  . Hyperlipidemia   . Hypertension   . IBD (inflammatory bowel disease) 04/05/2016  . Tobacco use disorder 03/28/2015    Past Surgical History:  Procedure Laterality Date  . COLONOSCOPY    . TONSILLECTOMY      Family History  Problem Relation Age of Onset  . Hypertension Father   . Diabetes Father   . Stroke Father      SOCIAL HX: see hpi   Current Outpatient Medications:  .  amLODipine (NORVASC) 10 MG tablet, Take 1 tablet (10 mg total) by mouth daily., Disp: 90 tablet, Rfl: 3 .  carvedilol (COREG) 3.125 MG tablet, Take 1 tablet (3.125 mg total) by mouth 2 (two) times daily with a meal., Disp: 180 tablet, Rfl: 3 .  losartan (COZAAR) 100 MG tablet, Take 1 tablet (100 mg total) by mouth daily., Disp: 90 tablet, Rfl: 3  EXAM:  Vitals:   11/19/18 0906  BP: 120/72  Pulse: 94  Temp: 98.5 F (36.9 C)  SpO2: 98%    Body mass index is 30.94 kg/m.  GENERAL: vitals reviewed and listed above, alert, oriented, appears well hydrated and in no acute distress  HEENT: atraumatic, conjunttiva clear, no obvious abnormalities on inspection of external nose and ears, normal appearance of ear canals and TMs, thick nasal congestion, mild post oropharyngeal erythema with PND, no tonsillar edema or exudate, no sinus TTP  NECK: no obvious masses on inspection  LUNGS: clear to auscultation bilaterally, no wheezes, rales or rhonchi, good air movement  CV: HRRR, no peripheral edema  MS: moves all extremities without noticeable abnormality  PSYCH: pleasant and cooperative, no obvious depression or anxiety  ASSESSMENT AND PLAN:  Discussed the following assessment and plan:  Flu-like symptoms - Plan: POC Influenza A&B(BINAX/QUICKVUE) Acute non-recurrent maxillary sinusitis Respiratory infection -out of window for likely benefit from tamiflu and declineds -discussed potential complications, possible sinusitis or LRI, treatment options, work up and risks. -he declined cxr -he opted  to start doxy for likely sinusitis and this has pretty good lung coverage -strict return and emergency precautions advised  Essential hypertension, benign Hyperlipidemia, unspecified hyperlipidemia type IBD (inflammatory bowel disease) Diet-controlled type 2 diabetes mellitus ([place]) Polyarthralgia -agrees to follow up in 2 weeks  and check labs then -decline further eval or management polyarthralgias or hx IBD - discussed risks of letting this go, he adamantly refuses  -Patient advised to return or notify a doctor immediately if symptoms worsen or persist or new concerns arise.  Patient Instructions  BEFORE YOU LEAVE: -update and obtain eye report/exam -follow up: 2 weeks for regular follow up and labs - make sure to keep this appointment.  Take the antibiotic as prescribed.  Follow up in 2 weeks and we will do labs then.      Lucretia Kern, DO

## 2018-11-19 NOTE — Patient Instructions (Signed)
BEFORE YOU LEAVE: -update and obtain eye report/exam -follow up: 2 weeks for regular follow up and labs - make sure to keep this appointment.  Take the antibiotic as prescribed.  Follow up in 2 weeks and we will do labs then.

## 2018-12-02 NOTE — Progress Notes (Signed)
HPI:  Using dictation device. Unfortunately this device frequently misinterprets words/phrases.  Samuel Butler is a pleasant 58 y.o. here for follow up. Chronic medical problems summarized below were reviewed for changes. Has a history of poor compliance with our care recommendations.  Reports fully recovered from flu and feeling well. Reports chronic worsening of erectile dysfunction. Sometimes gets erection, but often doe snot maintain. lots of stress as he and his wife work and take care of their grand kids as well. Denies CP, SOB, DOE, treatment intolerance. Due for labs  HTN/DM/Hyperlipidemia: -meds: amlodipine, coreg, losartan -diet controlled diabetes and cholesterol -he repetitively refuses a statin or medical treatment despite being advised of risks, reports "bad" reaction to Crestor  Arthralgias: -rare now -uric acid, RF, CCP neg, ESR very mildly elevated during prior flare -referred to rheum in the past, he repetitively declined and opted not to go as was doing better -informed and he understands risks of refusing evaluation, potential serious etiologies and potential for irreversible damage if serious dx  IBD: -repetitively refuses GI follow up -informed and he understands risks of untreated/unmonitored disease   Hx tobacco use: -remains tobacco free  Erectile dysfunction: -see above  ROS: See pertinent positives and negatives per HPI.  Past Medical History:  Diagnosis Date  . DDD (degenerative disc disease), lumbar    per his report  . Diabetes mellitus without complication (Samuel Butler)   . Gout 04/16/2013  . Hyperlipidemia   . Hypertension   . IBD (inflammatory bowel disease) 04/05/2016  . Tobacco use disorder 03/28/2015    Past Surgical History:  Procedure Laterality Date  . COLONOSCOPY    . TONSILLECTOMY      Family History  Problem Relation Age of Onset  . Hypertension Father   . Diabetes Father   . Stroke Father     SOCIAL HX: see hpi   Current  Outpatient Medications:  .  amLODipine (NORVASC) 10 MG tablet, Take 1 tablet (10 mg total) by mouth daily., Disp: 90 tablet, Rfl: 3 .  carvedilol (COREG) 3.125 MG tablet, Take 1 tablet (3.125 mg total) by mouth 2 (two) times daily with a meal., Disp: 180 tablet, Rfl: 3 .  losartan (COZAAR) 100 MG tablet, Take 1 tablet (100 mg total) by mouth daily., Disp: 90 tablet, Rfl: 3 .  sildenafil (VIAGRA) 50 MG tablet, Take 1 tablet (50 mg total) by mouth daily as needed for erectile dysfunction. Do not take more then 1 dose in 24 hour period., Disp: 10 tablet, Rfl: 0  EXAM:  Vitals:   12/03/18 0912  BP: 124/90  Pulse: 79  Temp: 98.1 F (36.7 C)    Body mass index is 31.56 kg/m.  GENERAL: vitals reviewed and listed above, alert, oriented, appears well hydrated and in no acute distress  HEENT: atraumatic, conjunttiva clear, no obvious abnormalities on inspection of external nose and ears  NECK: no obvious masses on inspection  LUNGS: clear to auscultation bilaterally, no wheezes, rales or rhonchi, good air movement  CV: HRRR, no peripheral edema  GU: declined  MS: moves all extremities without noticeable abnormality  PSYCH: pleasant and cooperative, no obvious depression or anxiety  ASSESSMENT AND PLAN:  Discussed the following assessment and plan:  Diet-controlled type 2 diabetes mellitus (Samuel Butler) - Plan: Hemoglobin A1c  Hyperlipidemia, unspecified hyperlipidemia type  Hypertension associated with diabetes (Samuel Butler) - Plan: Basic metabolic panel, CBC  Erectile dysfunction, unspecified erectile dysfunction type  -labs per orders -lifestyle recs -discussed etiologies, evaluation, treatment and risks with erectile  dysfunction. He is adamant about trying viagra to get over his anxiety about this and understands risks and is willing to take. Rx small amount. lifestyle recs. Advised better control sugar, bp,cholesterol etc as well. -follow up 3-4 months, sooner as needed -he refuses  vaccines   Patient Instructions  BEFORE YOU LEAVE: -labs -follow up: 3-4 months  We have ordered labs or studies at this visit. It can take up to 1-2 weeks for results and processing. IF results require follow up or explanation, we will call you with instructions. Clinically stable results will be released to your MYCHART. If you have not heard from us or cannot find your results in MYCHART in 2 weeks please contact our office at [phone].  If you are not yet signed up for MYCHART, please consider signing up.   We recommend the following healthy lifestyle for LIFE: 1) Small portions. But, make sure to get regular (at least 3 per day), healthy meals and small healthy snacks if needed.  2) Eat a healthy clean diet.   TRY TO EAT: -at least 5-7 servings of low sugar, colorful, and nutrient rich vegetables per day (not corn, potatoes or bananas.) -berries are the best choice if you wish to eat fruit (only eat small amounts if trying to reduce weight)  -lean meets (fish, white meat of chicken or turkey) -vegan proteins for some meals - beans or tofu, whole grains, nuts and seeds -Replace bad fats with good fats - good fats include: fish, nuts and seeds, canola oil, olive oil -small amounts of low fat or non fat dairy -small amounts of100 % whole grains - check the lables -drink plenty of water  AVOID: -SUGAR, sweets, anything with added sugar, corn syrup or sweeteners - must read labels as even foods advertised as "healthy" often are loaded with sugar -if you must have a sweetener, small amounts of stevia may be best -sweetened beverages and artificially sweetened beverages -simple starches (rice, bread, potatoes, pasta, chips, etc - small amounts of 100% whole grains are ok) -red meat, pork, butter -fried foods, fast food, processed food, excessive dairy, eggs and coconut.  3)Get at least 150 minutes of sweaty aerobic exercise per week.  4)Reduce stress - consider counseling,  meditation and relaxation to balance other aspects of your life.  Sildenafil tablets (Erectile Dysfunction) What is this medicine? SILDENAFIL (sil DEN a fil) is used to treat erection problems in men. This medicine may be used for other purposes; ask your health care provider or pharmacist if you have questions. COMMON BRAND NAME(S): Viagra What should I tell my health care provider before I take this medicine? They need to know if you have any of these conditions: -bleeding disorders -eye or vision problems, including a rare inherited eye disease called retinitis pigmentosa -anatomical deformation of the penis, Peyronie's disease, or history of priapism (painful and prolonged erection) -heart disease, angina, a history of heart attack, irregular heart beats, or other heart problems -high or low blood pressure -history of blood diseases, like sickle cell anemia or leukemia -history of stomach bleeding -kidney disease -liver disease -stroke -an unusual or allergic reaction to sildenafil, other medicines, foods, dyes, or preservatives -pregnant or trying to get pregnant -breast-feeding How should I use this medicine? Take this medicine by mouth with a glass of water. Follow the directions on the prescription label. The dose is usually taken 1 hour before sexual activity. You should not take the dose more than once per day. Do not take your   medicine more often than directed. Talk to your pediatrician regarding the use of this medicine in children. This medicine is not used in children for this condition. Overdosage: If you think you have taken too much of this medicine contact a poison control center or emergency room at once. NOTE: This medicine is only for you. Do not share this medicine with others. What if I miss a dose? This does not apply. Do not take double or extra doses. What may interact with this medicine? Do not take this medicine with any of the following  medications: -cisapride -nitrates like amyl nitrite, isosorbide dinitrate, isosorbide mononitrate, nitroglycerin -riociguat This medicine may also interact with the following medications: -antiviral medicines for HIV or AIDS -bosentan -certain medicines for benign prostatic hyperplasia (BPH) -certain medicines for blood pressure -certain medicines for fungal infections like ketoconazole and itraconazole -cimetidine -erythromycin -rifampin This list may not describe all possible interactions. Give your health care provider a list of all the medicines, herbs, non-prescription drugs, or dietary supplements you use. Also tell them if you smoke, drink alcohol, or use illegal drugs. Some items may interact with your medicine. What should I watch for while using this medicine? If you notice any changes in your vision while taking this drug, call your doctor or health care professional as soon as possible. Stop using this medicine and call your health care provider right away if you have a loss of sight in one or both eyes. Contact your doctor or health care professional right away if you have an erection that lasts longer than 4 hours or if it becomes painful. This may be a sign of a serious problem and must be treated right away to prevent permanent damage. If you experience symptoms of nausea, dizziness, chest pain or arm pain upon initiation of sexual activity after taking this medicine, you should refrain from further activity and call your doctor or health care professional as soon as possible. Do not drink alcohol to excess (examples, 5 glasses of wine or 5 shots of whiskey) when taking this medicine. When taken in excess, alcohol can increase your chances of getting a headache or getting dizzy, increasing your heart rate or lowering your blood pressure. Using this medicine does not protect you or your partner against HIV infection (the virus that causes AIDS) or other sexually transmitted  diseases. What side effects may I notice from receiving this medicine? Side effects that you should report to your doctor or health care professional as soon as possible: -allergic reactions like skin rash, itching or hives, swelling of the face, lips, or tongue -breathing problems -changes in hearing -changes in vision -chest pain -fast, irregular heartbeat -prolonged or painful erection -seizures Side effects that usually do not require medical attention (report to your doctor or health care professional if they continue or are bothersome): -back pain -dizziness -flushing -headache -indigestion -muscle aches -nausea -stuffy or runny nose This list may not describe all possible side effects. Call your doctor for medical advice about side effects. You may report side effects to FDA at 1-800-FDA-1088. Where should I keep my medicine? Keep out of reach of children. Store at room temperature between 15 and 30 degrees C (59 and 86 degrees F). Throw away any unused medicine after the expiration date. NOTE: This sheet is a summary. It may not cover all possible information. If you have questions about this medicine, talk to your doctor, pharmacist, or health care provider.  2019 Elsevier/Gold Standard (2015-10-11 12:00:25)             R , DO   

## 2018-12-03 ENCOUNTER — Ambulatory Visit: Payer: Managed Care, Other (non HMO) | Admitting: Family Medicine

## 2018-12-03 ENCOUNTER — Encounter: Payer: Self-pay | Admitting: Family Medicine

## 2018-12-03 VITALS — BP 124/90 | HR 79 | Temp 98.1°F | Ht 70.75 in | Wt 224.7 lb

## 2018-12-03 DIAGNOSIS — E785 Hyperlipidemia, unspecified: Secondary | ICD-10-CM | POA: Diagnosis not present

## 2018-12-03 DIAGNOSIS — N529 Male erectile dysfunction, unspecified: Secondary | ICD-10-CM

## 2018-12-03 DIAGNOSIS — E119 Type 2 diabetes mellitus without complications: Secondary | ICD-10-CM | POA: Diagnosis not present

## 2018-12-03 DIAGNOSIS — E1159 Type 2 diabetes mellitus with other circulatory complications: Secondary | ICD-10-CM | POA: Diagnosis not present

## 2018-12-03 DIAGNOSIS — I10 Essential (primary) hypertension: Secondary | ICD-10-CM

## 2018-12-03 DIAGNOSIS — I152 Hypertension secondary to endocrine disorders: Secondary | ICD-10-CM

## 2018-12-03 LAB — CBC
HEMATOCRIT: 40.1 % (ref 39.0–52.0)
HEMOGLOBIN: 13.2 g/dL (ref 13.0–17.0)
MCHC: 32.9 g/dL (ref 30.0–36.0)
MCV: 90.9 fl (ref 78.0–100.0)
Platelets: 204 10*3/uL (ref 150.0–400.0)
RBC: 4.41 Mil/uL (ref 4.22–5.81)
RDW: 14.6 % (ref 11.5–15.5)
WBC: 4.9 10*3/uL (ref 4.0–10.5)

## 2018-12-03 LAB — BASIC METABOLIC PANEL
BUN: 16 mg/dL (ref 6–23)
CHLORIDE: 104 meq/L (ref 96–112)
CO2: 31 meq/L (ref 19–32)
CREATININE: 0.97 mg/dL (ref 0.40–1.50)
Calcium: 9.1 mg/dL (ref 8.4–10.5)
GFR: 95.88 mL/min (ref >60.00)
Glucose, Bld: 93 mg/dL (ref 70–99)
Potassium: 3.9 mEq/L (ref 3.5–5.1)
Sodium: 141 mEq/L (ref 135–145)

## 2018-12-03 LAB — HEMOGLOBIN A1C: HEMOGLOBIN A1C: 6.1 % (ref 4.6–6.5)

## 2018-12-03 MED ORDER — SILDENAFIL CITRATE 50 MG PO TABS: 50.0000 mg | ORAL_TABLET | Freq: Every day | ORAL | 0 refills | Status: DC | PRN

## 2018-12-03 NOTE — Patient Instructions (Signed)
BEFORE YOU LEAVE: -labs -follow up: 3-4 months  We have ordered labs or studies at this visit. It can take up to 1-2 weeks for results and processing. IF results require follow up or explanation, we will call you with instructions. Clinically stable results will be released to your [facility]. If you have not heard from [place] or cannot find your results in [facility] : [facility] in 2 weeks please contact our office at [phone].  If you are not yet signed up for [facility], please consider signing up.   We recommend the following healthy lifestyle for LIFE: 1) Small portions. But, make sure to get regular (at least 3 per day), healthy meals and small healthy snacks if needed.  2) Eat a healthy clean diet.   TRY TO EAT: -at least 5-7 servings of low sugar, colorful, and nutrient rich vegetables per day (not corn, potatoes or bananas.) -berries are the best choice if you wish to eat fruit (only eat small amounts if trying to reduce weight)  -lean meets (fish, white meat of chicken or [place]) -vegan proteins for some meals - beans or tofu, whole grains, nuts and seeds -Replace bad fats with good fats - good fats include: fish, nuts and seeds, canola oil, olive oil -small amounts of low fat or non fat dairy -small amounts of100 % whole grains - check the lables -drink plenty of water  AVOID: -SUGAR, sweets, anything with added sugar, corn syrup or sweeteners - must read labels as even foods advertised as "healthy" often are loaded with sugar -if you must have a sweetener, small amounts of stevia may be best -sweetened beverages and artificially sweetened beverages -simple starches (rice, bread, potatoes, pasta, chips, etc - small amounts of 100% whole grains are ok) -red meat, pork, butter -fried foods, fast food, processed food, excessive dairy, eggs and coconut.  3)Get at least 150 minutes of sweaty aerobic exercise per week.  4)Reduce stress - consider counseling, meditation and relaxation to balance  other aspects of your life.  Sildenafil tablets (Erectile Dysfunction) What is this medicine? SILDENAFIL (sil DEN a fil) is used to treat erection problems in men. This medicine may be used for other purposes; ask your health care provider or pharmacist if you have questions. COMMON BRAND NAME(S): Viagra What should I tell my health care provider before I take this medicine? They need to know if you have any of these conditions: -bleeding disorders -eye or vision problems, including a rare inherited eye disease called retinitis pigmentosa -anatomical deformation of the penis, Peyronie's disease, or history of priapism (painful and prolonged erection) -heart disease, angina, a history of heart attack, irregular heart beats, or other heart problems -high or low blood pressure -history of blood diseases, like sickle cell anemia or leukemia -history of stomach bleeding -kidney disease -liver disease -stroke -an unusual or allergic reaction to sildenafil, other medicines, foods, dyes, or preservatives -pregnant or trying to get pregnant -breast-feeding How should I use this medicine? Take this medicine by mouth with a glass of water. Follow the directions on the prescription label. The dose is usually taken 1 hour before sexual activity. You should not take the dose more than once per day. Do not take your medicine more often than directed. Talk to your pediatrician regarding the use of this medicine in children. This medicine is not used in children for this condition. Overdosage: If you think you have taken too much of this medicine contact a poison control center or emergency room at once. NOTE: This  medicine is only for you. Do not share this medicine with others. What if I miss a dose? This does not apply. Do not take double or extra doses. What may interact with this medicine? Do not take this medicine with any of the following medications: -cisapride -nitrates like amyl nitrite,  isosorbide dinitrate, isosorbide mononitrate, nitroglycerin -riociguat This medicine may also interact with the following medications: -antiviral medicines for HIV or AIDS -bosentan -certain medicines for benign prostatic hyperplasia (BPH) -certain medicines for blood pressure -certain medicines for fungal infections like ketoconazole and itraconazole -cimetidine -erythromycin -rifampin This list may not describe all possible interactions. Give your health care provider a list of all the medicines, herbs, non-prescription drugs, or dietary supplements you use. Also tell them if you smoke, drink alcohol, or use illegal drugs. Some items may interact with your medicine. What should I watch for while using this medicine? If you notice any changes in your vision while taking this drug, call your doctor or health care professional as soon as possible. Stop using this medicine and call your health care provider right away if you have a loss of sight in one or both eyes. Contact your doctor or health care professional right away if you have an erection that lasts longer than 4 hours or if it becomes painful. This may be a sign of a serious problem and must be treated right away to prevent permanent damage. If you experience symptoms of nausea, dizziness, chest pain or arm pain upon initiation of sexual activity after taking this medicine, you should refrain from further activity and call your doctor or health care professional as soon as possible. Do not drink alcohol to excess (examples, 5 glasses of wine or 5 shots of whiskey) when taking this medicine. When taken in excess, alcohol can increase your chances of getting a headache or getting dizzy, increasing your heart rate or lowering your blood pressure. Using this medicine does not protect you or your partner against HIV infection (the virus that causes AIDS) or other sexually transmitted diseases. What side effects may I notice from receiving this  medicine? Side effects that you should report to your doctor or health care professional as soon as possible: -allergic reactions like skin rash, itching or hives, swelling of the face, lips, or tongue -breathing problems -changes in hearing -changes in vision -chest pain -fast, irregular heartbeat -prolonged or painful erection -seizures Side effects that usually do not require medical attention (report to your doctor or health care professional if they continue or are bothersome): -back pain -dizziness -flushing -headache -indigestion -muscle aches -nausea -stuffy or runny nose This list may not describe all possible side effects. Call your doctor for medical advice about side effects. You may report side effects to FDA at 1-800-FDA-1088. Where should I keep my medicine? Keep out of reach of children. Store at room temperature between 15 and 30 degrees C (59 and 86 degrees F). Throw away any unused medicine after the expiration date. NOTE: This sheet is a summary. It may not cover all possible information. If you have questions about this medicine, talk to your doctor, pharmacist, or health care provider.  2019 Elsevier/Gold Standard (2015-10-11 12:00:25)

## 2019-02-22 ENCOUNTER — Other Ambulatory Visit: Payer: Self-pay | Admitting: Family Medicine

## 2019-03-18 ENCOUNTER — Other Ambulatory Visit: Payer: Self-pay | Admitting: Family Medicine

## 2019-03-18 DIAGNOSIS — I10 Essential (primary) hypertension: Secondary | ICD-10-CM

## 2019-03-31 ENCOUNTER — Telehealth: Payer: Self-pay | Admitting: *Deleted

## 2019-03-31 ENCOUNTER — Other Ambulatory Visit: Payer: Self-pay

## 2019-03-31 ENCOUNTER — Ambulatory Visit (INDEPENDENT_AMBULATORY_CARE_PROVIDER_SITE_OTHER): Payer: Self-pay | Admitting: Family Medicine

## 2019-03-31 DIAGNOSIS — R197 Diarrhea, unspecified: Secondary | ICD-10-CM

## 2019-03-31 DIAGNOSIS — K529 Noninfective gastroenteritis and colitis, unspecified: Secondary | ICD-10-CM

## 2019-03-31 DIAGNOSIS — K921 Melena: Secondary | ICD-10-CM

## 2019-03-31 MED ORDER — SULFASALAZINE 500 MG PO TABS: 500.0000 mg | ORAL_TABLET | Freq: Three times a day (TID) | ORAL | 0 refills | Status: DC

## 2019-03-31 NOTE — Telephone Encounter (Signed)
Unable to leave a message at the pts cell number (states call cannot be completed as dialed), no answer at the home number.

## 2019-03-31 NOTE — Progress Notes (Signed)
Virtual Visit via Video Note  I connected with Samuel Butler  on 03/31/19 at 10:30 AM EDT by a video enabled telemedicine application and verified that I am speaking with the correct person using two identifiers.  Location patient: home Location provider:work or home office Persons participating in the virtual visit: patient, provider  I discussed the limitations of evaluation and management by telemedicine and the availability of in person appointments. The patient expressed understanding and agreed to proceed.   Samuel Butler DOB: Feb 19, 1961 Encounter date: 03/31/2019  This is a 58 y.o. male who presents with No chief complaint on file.   History of present illness: Has been having loose bowels. Stomach is hurting. Had colonoscopy and polyp removed 2012; and wonders if this is coming back.  States that symptoms are exactly the same as they were when he had his previous colonoscopy.  He states that symptoms were due to a polyp, however, on colonoscopy review he also was noted to have some inflammatory bowel changes in the sigmoid colon at that time (as well as a 5 mm polyp).  Polyp was hyperplastic.  Has been having loose stools now for about 5 days. Started with a little blood in stool, then went from hard to soft and runny stool. Abd is hurting in lower abdomen and cramping mostly before BM. Had same symptoms when he had polyp in the past.  Once polyp was removed then symptoms cleared up.  He has had blood, but this is mostly with wiping.  Only noting during bowel movements.  Noting significant amount of blood within the stool or toilet bowl.  Abdominal pain in general is much better today than it was during the first or second day of symptoms.  Has 3-4 loose stools/day. Any time he eats - has loose stool about 2-3 hours later. Has feeling like getting warm, mouth watering and then vomited. This has happened twice - not a lot, just a gag the last time. These sx have come on on last  couple of days. Between episodes he feels better.   No fevers. Feels like he gets chilled if he drinks something cold, but not having chills in general.   No one has been sick around him. 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  No recent travel.  Not eating anything different than his normal.  Denies any raw foods or undercooked meats or fish.  Pain in abd comes and goes.  Notes mostly before bowel movement, but does improve after bowel movement.    No problems with urination.   No back pain that is different than normal.   Does feel a lot better today than he has been doing, but still having loose stools.   HPI   Allergies  Allergen Reactions  . Crestor [Rosuvastatin Calcium] Other (See Comments)    Joint aches   No outpatient medications have been marked as taking for the 03/31/19 encounter (Office Visit) with Caren Macadam, MD.    Review of Systems  Constitutional: Negative for chills, fatigue and fever.  Respiratory: Negative for cough, chest tightness, shortness of breath and wheezing.   Cardiovascular: Negative for chest pain, palpitations and leg swelling.  Gastrointestinal: Positive for abdominal pain, blood in stool, diarrhea, nausea and vomiting (States this is not full vomit, but more of gagging.  Not currently nauseated.). Negative for constipation and rectal pain.    Objective:  There were no vitals taken for this visit.  BP Readings from Last 3 Encounters:  12/03/18 124/90  11/19/18 120/72  07/20/18 128/88   Wt Readings from Last 3 Encounters:  12/03/18 224 lb 11.2 oz (101.9 kg)  11/19/18 220 lb 4.8 oz (99.9 kg)  07/20/18 225 lb 3.2 oz (102.2 kg)    EXAM:  GENERAL: alert, oriented, appears well and in no acute distress  HEENT: atraumatic, conjunctiva clear, no obvious abnormalities on inspection of external nose and ears  NECK: normal movements of the head and neck  LUNGS: on inspection no signs of  respiratory distress, breathing rate appears normal, no obvious gross SOB, gasping or wheezing  CV: no obvious cyanosis  MS: moves all visible extremities without noticeable abnormality  PSYCH/NEURO: pleasant and cooperative, no obvious depression or anxiety, speech and thought processing grossly intact  Assessment/Plan  1. IBD (inflammatory bowel disease) Was placed on medication after colonoscopy, but he does not recall what this is.  He states he only took it for maybe a couple of weeks and then stopped.  I do think it is important for him to follow-up with GI.  He is due anyways for repeat colonoscopy, but I would like their input on his documented inflammatory bowel disease as well.  I did not give steroids today based on his exam and without having more info to ensure there is not underlying infection. I am reassured by improvement in pain. We discussed doing low-inflammatory bowel diet to help avoid further gut irritation.  - sulfaSALAzine (AZULFIDINE) 500 MG tablet; Take 1 tablet (500 mg total) by mouth 3 (three) times daily.  Dispense: 90 tablet; Refill: 0 - Ambulatory referral to Gastroenterology  2. Diarrhea, unspecified type Keeping up with fluids. If diarrhea is not improving, could certainly consider bloodwork and stool studies to rule out infectious etiology. We discussed avoiding diarrhea triggers like dairy in meanwhile. He has been scheduled with Dr. Maudie Mercury tomorrow to follow up on symptoms so that further work up could be started if he is not continuing to see improvement.  - Ambulatory referral to Gastroenterology  3. Blood in stool See above. More with wiping, but occurring with each BM. Would consider cbc/stool eval pending above.  - Ambulatory referral to Gastroenterology    I discussed the assessment and treatment plan with the patient. The patient was provided an opportunity to ask questions and all were answered. The patient agreed with the plan and demonstrated an  understanding of the instructions.   The patient was advised to call back or seek an in-person evaluation if the symptoms worsen or if the condition fails to improve as anticipated.  I provided 32 minutes of non-face-to-face time during this encounter.   Micheline Rough, MD

## 2019-03-31 NOTE — Telephone Encounter (Signed)
-----   Message from Caren Macadam, MD sent at 03/31/2019 11:09 AM EDT ----- Please schedule f/u doxy with [facility] tomorrow to check in on symptoms

## 2019-04-01 NOTE — Telephone Encounter (Signed)
No answer at the pts home number or cell number.

## 2019-04-06 ENCOUNTER — Telehealth: Payer: Self-pay | Admitting: *Deleted

## 2019-04-06 ENCOUNTER — Ambulatory Visit: Payer: Self-pay | Admitting: Family Medicine

## 2019-04-06 ENCOUNTER — Encounter: Payer: Self-pay | Admitting: *Deleted

## 2019-04-06 NOTE — Telephone Encounter (Signed)
Unable to leave a message at the home or cell number.  Letter completed and mailed to the pts home to call the office regarding his appointment as we were unable to reach him.

## 2019-04-06 NOTE — Telephone Encounter (Signed)
-----   Message from Lucretia Kern, DO sent at 04/06/2019  9:18 AM EDT ----- Thanks. Please try to call both numbers and his spouse number today (if you did not already today) to see if there is a time we can talk - looks like he was pretty sick. Looks like GI has tried to contact him as well. If unable to reach at any number in the chart, please send letter asking him to cal our office to schedule appt with [place] and to call the GI office and give him the number.  Thanks! ----- Message ----- From: Agnes Lawrence, CMA Sent: 04/06/2019   8:16 AM EDT To: Lucretia Kern, DO  Unable to reach the pt to leave a message.  I sent the doxy in hopes he will call the office? Wendie Simmer  ----- Message ----- From: Lucretia Kern, DO Sent: 04/06/2019   8:13 AM EDT To: Agnes Lawrence, CMA  This pt is on the schedule at 9? Is this a phone or doxy visit? Is appt at 9?

## 2019-08-10 ENCOUNTER — Other Ambulatory Visit: Payer: Self-pay | Admitting: Family Medicine

## 2019-09-07 ENCOUNTER — Other Ambulatory Visit: Payer: Self-pay | Admitting: Family Medicine

## 2019-10-02 ENCOUNTER — Other Ambulatory Visit: Payer: Self-pay | Admitting: Family Medicine

## 2019-10-02 DIAGNOSIS — I10 Essential (primary) hypertension: Secondary | ICD-10-CM

## 2019-10-08 ENCOUNTER — Encounter: Payer: Self-pay | Admitting: Internal Medicine

## 2019-10-08 ENCOUNTER — Other Ambulatory Visit: Payer: Self-pay | Admitting: Family Medicine

## 2019-10-13 NOTE — Telephone Encounter (Signed)
Last OV 03/31/19 Last refill 09/07/19 #30/0 Next OV 10/26/19

## 2019-10-26 ENCOUNTER — Encounter: Payer: Self-pay | Admitting: Internal Medicine

## 2019-10-26 ENCOUNTER — Other Ambulatory Visit: Payer: Self-pay

## 2019-10-26 ENCOUNTER — Ambulatory Visit: Payer: BC Managed Care – PPO | Admitting: Internal Medicine

## 2019-10-26 ENCOUNTER — Other Ambulatory Visit: Payer: Self-pay | Admitting: Internal Medicine

## 2019-10-26 VITALS — BP 130/80 | HR 74 | Temp 98.0°F | Ht 71.5 in | Wt 245.3 lb

## 2019-10-26 DIAGNOSIS — E785 Hyperlipidemia, unspecified: Secondary | ICD-10-CM | POA: Diagnosis not present

## 2019-10-26 DIAGNOSIS — I10 Essential (primary) hypertension: Secondary | ICD-10-CM | POA: Diagnosis not present

## 2019-10-26 DIAGNOSIS — E559 Vitamin D deficiency, unspecified: Secondary | ICD-10-CM | POA: Insufficient documentation

## 2019-10-26 DIAGNOSIS — E119 Type 2 diabetes mellitus without complications: Secondary | ICD-10-CM

## 2019-10-26 DIAGNOSIS — R600 Localized edema: Secondary | ICD-10-CM

## 2019-10-26 DIAGNOSIS — F17200 Nicotine dependence, unspecified, uncomplicated: Secondary | ICD-10-CM

## 2019-10-26 DIAGNOSIS — K529 Noninfective gastroenteritis and colitis, unspecified: Secondary | ICD-10-CM

## 2019-10-26 DIAGNOSIS — E8809 Other disorders of plasma-protein metabolism, not elsewhere classified: Secondary | ICD-10-CM | POA: Insufficient documentation

## 2019-10-26 DIAGNOSIS — E669 Obesity, unspecified: Secondary | ICD-10-CM

## 2019-10-26 LAB — COMPREHENSIVE METABOLIC PANEL
ALT: 13 U/L (ref 0–53)
AST: 15 U/L (ref 0–37)
Albumin: 3 g/dL — ABNORMAL LOW (ref 3.5–5.2)
Alkaline Phosphatase: 111 U/L (ref 39–117)
BUN: 17 mg/dL (ref 6–23)
CO2: 31 mEq/L (ref 19–32)
Calcium: 8.3 mg/dL — ABNORMAL LOW (ref 8.4–10.5)
Chloride: 104 mEq/L (ref 96–112)
Creatinine, Ser: 0.95 mg/dL (ref 0.40–1.50)
GFR: 97.91 mL/min (ref >60.00)
Glucose, Bld: 101 mg/dL — ABNORMAL HIGH (ref 70–99)
Potassium: 3.6 mEq/L (ref 3.5–5.1)
Sodium: 142 mEq/L (ref 135–145)
Total Bilirubin: 0.4 mg/dL (ref 0.2–1.2)
Total Protein: 5.4 g/dL — ABNORMAL LOW (ref 6.0–8.3)

## 2019-10-26 LAB — CBC
HCT: 44.9 % (ref 39.0–52.0)
Hemoglobin: 14.6 g/dL (ref 13.0–17.0)
MCHC: 32.5 g/dL (ref 30.0–36.0)
MCV: 90.8 fl (ref 78.0–100.0)
Platelets: 223 10*3/uL (ref 150.0–400.0)
RBC: 4.95 Mil/uL (ref 4.22–5.81)
RDW: 14.5 % (ref 11.5–15.5)
WBC: 5.5 10*3/uL (ref 4.0–10.5)

## 2019-10-26 LAB — POCT GLYCOSYLATED HEMOGLOBIN (HGB A1C): Hemoglobin A1C: 6.2 % — AB (ref 4.0–5.6)

## 2019-10-26 LAB — TSH: TSH: 1.76 u[IU]/mL (ref 0.35–4.50)

## 2019-10-26 LAB — VITAMIN D 25 HYDROXY (VIT D DEFICIENCY, FRACTURES): VITD: <7 ng/mL — ABNORMAL LOW (ref 30.00–100.00)

## 2019-10-26 LAB — VITAMIN B12: Vitamin B-12: 257 pg/mL (ref 211–911)

## 2019-10-26 MED ORDER — VITAMIN D (ERGOCALCIFEROL) 1.25 MG (50000 UNIT) PO CAPS: 50000.0000 [IU] | ORAL_CAPSULE | ORAL | 0 refills | Status: DC

## 2019-10-26 NOTE — Addendum Note (Signed)
Addended by: Isaiah Serge D on: 10/26/2019 03:06 PM   Modules accepted: Orders

## 2019-10-26 NOTE — Patient Instructions (Signed)
-  Nice seeing you today!!  -Lab work today; will notify you once results are available.  -Echocardiogram ordered today.  -Schedule follow up as soon as possible for your annual physical. Please come in fasting that day.

## 2019-10-26 NOTE — Progress Notes (Signed)
Established Patient Office Visit     This visit occurred during the SARS-CoV-2 public health emergency.  Safety protocols were in place, including screening questions prior to the visit, additional usage of staff PPE, and extensive cleaning of exam room while observing appropriate contact time as indicated for disinfecting solutions.    CC/Reason for Visit: Establish care, discuss some acute concerns  HPI: Samuel Butler is a 58 y.o. male who is coming in today for the above mentioned reasons. Past Medical History is significant for: Hypertension that has been well controlled on triple therapy with losartan 100, amlodipine 10, Coreg 3.125 twice daily.  History of hyperlipidemia who had an intolerance to statins, history of ED with occasional sildenafil use, inflammatory bowel disease followed by GI on sulfasalazine prior tobacco abuse, quit 3 years ago.  He also has a history of type 2 diabetes that has been diet controlled, most recent A1c was 6.2 in January 2020.  He is complaining of some lower extremity tingling and swelling that has been ongoing now for a few months.  He has been wearing compression stockings with some relief.   Past Medical/Surgical History: Past Medical History:  Diagnosis Date  . DDD (degenerative disc disease), lumbar    per his report  . Diabetes mellitus without complication ([place])   . Gout 04/16/2013  . Hyperlipidemia   . Hypertension   . IBD (inflammatory bowel disease) 04/05/2016  . Tobacco use disorder 03/28/2015    Past Surgical History:  Procedure Laterality Date  . COLONOSCOPY    . TONSILLECTOMY      Social History:  reports that he quit smoking about 3 years ago. His smoking use included cigarettes. He has never used smokeless tobacco. He reports current alcohol use. He reports that he does not use drugs.  Allergies: Allergies  Allergen Reactions  . Crestor [Rosuvastatin Calcium] Other (See Comments)    Joint aches    Family History:    Family History  Problem Relation Age of Onset  . Hypertension Father   . Diabetes Father   . Stroke Father      Current Outpatient Medications:  .  amLODipine (NORVASC) 10 MG tablet, Take 1 tablet by mouth once daily, Disp: 30 tablet, Rfl: 0 .  carvedilol (COREG) 3.125 MG tablet, TAKE 1 TABLET BY MOUTH TWICE DAILY WITH A MEAL, Disp: 180 tablet, Rfl: 1 .  losartan (COZAAR) 100 MG tablet, TAKE 1 TABLET BY MOUTH ONCE DAILY . APPOINTMENT REQUIRED FOR FUTURE REFILLS, Disp: 30 tablet, Rfl: 0 .  sildenafil (VIAGRA) 50 MG tablet, Take 1 tablet (50 mg total) by mouth daily as needed for erectile dysfunction. Do not take more then 1 dose in 24 hour period., Disp: 10 tablet, Rfl: 0  Review of Systems:  Constitutional: Denies fever, chills, diaphoresis, appetite change and fatigue.  HEENT: Denies photophobia, eye pain, redness, hearing loss, ear pain, congestion, sore throat, rhinorrhea, sneezing, mouth sores, trouble swallowing, neck pain, neck stiffness and tinnitus.   Respiratory: Denies SOB, DOE, cough, chest tightness,  and wheezing.   Cardiovascular: Denies chest pain, palpitations. Gastrointestinal: Denies nausea, vomiting, abdominal pain, diarrhea, constipation, blood in stool and abdominal distention.  Genitourinary: Denies dysuria, urgency, frequency, hematuria, flank pain and difficulty urinating.  Endocrine: Denies: hot or cold intolerance, sweats, changes in hair or nails, polyuria, polydipsia. Musculoskeletal: Denies myalgias, back pain, joint swelling, arthralgias and gait problem.  Skin: Denies pallor, rash and wound.  Neurological: Denies dizziness, seizures, syncope, weakness, light-headedness, numbness and  headaches.  Hematological: Denies adenopathy. Easy bruising, personal or family bleeding history  Psychiatric/Behavioral: Denies suicidal ideation, mood changes, confusion, nervousness, sleep disturbance and agitation    Physical Exam: Vitals:   10/26/19 1431  BP: 130/80   Pulse: 74  Temp: 98 F (36.7 C)  TempSrc: Temporal  SpO2: 96%  Weight: 245 lb 4.8 oz (111.3 kg)  Height: 5' 11.5" (1.816 m)    Body mass index is 33.74 kg/m.   Constitutional: NAD, calm, comfortable Eyes: PERRL, lids and conjunctivae normal ENMT: Mucous membranes are moist. Respiratory: clear to auscultation bilaterally, no wheezing, no crackles. Normal respiratory effort. No accessory muscle use.  Cardiovascular: Regular rate and rhythm, no murmurs / rubs / gallops.  1-2+ pitting edema bilaterally. 2+ pedal pulses.   Abdomen: no tenderness, no masses palpated. No hepatosplenomegaly. Bowel sounds positive.  Musculoskeletal: no clubbing / cyanosis. No joint deformity upper and lower extremities. Good ROM, no contractures. Normal muscle tone.  Skin: no rashes, lesions, ulcers. No induration Neurologic: Grossly intact and nonfocal Psychiatric: Normal judgment and insight. Alert and oriented x 3. Normal mood.    Impression and Plan:  Diet-controlled type 2 diabetes mellitus ([place])  -A1c in office today 6.2. -Okay to observe off medications for now.  Essential hypertension, benign -Well-controlled on current regimen.  Hyperlipidemia, unspecified hyperlipidemia type -Last LDL was 156 in June 2019. -Had intolerance to Crestor but has not tried any other statin. -He will return to check lipids during his CPE.  Tobacco use disorder -Quit smoking as of 3 years ago.  IBD (inflammatory bowel disease) -Followed by GI on sulfasalazine, still with occasional bloody bowel movements.  He is overdue for colonoscopy.  Bilateral lower extremity edema -Check renal function, albumin, B12, vitamin D, TSH, will also check 2D echo as I believe heart failure with preserved ejection fraction is a possibility given his longstanding history of hypertension. -Further plan to follow results.  Obesity (BMI 30.0-34.9) -Discussed healthy lifestyle, including increased physical activity and better  food choices to promote weight loss.     Patient Instructions  -Nice seeing you today!!  -Lab work today; will notify you once results are available.  -Echocardiogram ordered today.  -Schedule follow up as soon as possible for your annual physical. Please come in fasting that day.       Lelon Frohlich, MD [facility]

## 2019-11-02 ENCOUNTER — Encounter: Payer: Self-pay | Admitting: Internal Medicine

## 2019-11-04 ENCOUNTER — Other Ambulatory Visit (HOSPITAL_COMMUNITY): Payer: BC Managed Care – PPO

## 2019-11-07 ENCOUNTER — Other Ambulatory Visit: Payer: Self-pay | Admitting: Family Medicine

## 2019-11-07 DIAGNOSIS — I10 Essential (primary) hypertension: Secondary | ICD-10-CM

## 2019-11-10 ENCOUNTER — Telehealth: Payer: Self-pay | Admitting: *Deleted

## 2019-11-10 NOTE — Telephone Encounter (Signed)
Copied from [place] [phone]. Topic: General - Other >> Nov 10, 2019  1:34 PM Yvette Rack wrote: Reason for CRM: Pt stated he received a call from Dr. Jerilee Hoh but he was at work and could not answer. Pt requests call back. Cb# [phone]

## 2019-11-10 NOTE — Telephone Encounter (Signed)
Called patient to schedule CPE 

## 2019-11-17 ENCOUNTER — Other Ambulatory Visit: Payer: Self-pay | Admitting: Family Medicine

## 2019-12-09 ENCOUNTER — Other Ambulatory Visit: Payer: Self-pay | Admitting: Internal Medicine

## 2019-12-09 DIAGNOSIS — I10 Essential (primary) hypertension: Secondary | ICD-10-CM

## 2019-12-23 ENCOUNTER — Other Ambulatory Visit: Payer: Self-pay | Admitting: Internal Medicine

## 2019-12-23 ENCOUNTER — Encounter: Payer: Self-pay | Admitting: Internal Medicine

## 2019-12-23 ENCOUNTER — Ambulatory Visit (INDEPENDENT_AMBULATORY_CARE_PROVIDER_SITE_OTHER): Payer: BC Managed Care – PPO | Admitting: Internal Medicine

## 2019-12-23 ENCOUNTER — Other Ambulatory Visit: Payer: Self-pay

## 2019-12-23 VITALS — BP 130/100 | HR 80 | Temp 98.4°F | Ht 71.0 in | Wt 247.1 lb

## 2019-12-23 DIAGNOSIS — I10 Essential (primary) hypertension: Secondary | ICD-10-CM | POA: Diagnosis not present

## 2019-12-23 DIAGNOSIS — Z0000 Encounter for general adult medical examination without abnormal findings: Secondary | ICD-10-CM | POA: Diagnosis not present

## 2019-12-23 DIAGNOSIS — F17200 Nicotine dependence, unspecified, uncomplicated: Secondary | ICD-10-CM

## 2019-12-23 DIAGNOSIS — M25512 Pain in left shoulder: Secondary | ICD-10-CM | POA: Diagnosis not present

## 2019-12-23 DIAGNOSIS — H539 Unspecified visual disturbance: Secondary | ICD-10-CM

## 2019-12-23 DIAGNOSIS — E119 Type 2 diabetes mellitus without complications: Secondary | ICD-10-CM

## 2019-12-23 DIAGNOSIS — G8929 Other chronic pain: Secondary | ICD-10-CM

## 2019-12-23 DIAGNOSIS — E559 Vitamin D deficiency, unspecified: Secondary | ICD-10-CM

## 2019-12-23 DIAGNOSIS — Z23 Encounter for immunization: Secondary | ICD-10-CM | POA: Diagnosis not present

## 2019-12-23 DIAGNOSIS — K529 Noninfective gastroenteritis and colitis, unspecified: Secondary | ICD-10-CM

## 2019-12-23 DIAGNOSIS — K921 Melena: Secondary | ICD-10-CM

## 2019-12-23 DIAGNOSIS — E785 Hyperlipidemia, unspecified: Secondary | ICD-10-CM | POA: Diagnosis not present

## 2019-12-23 LAB — COMPREHENSIVE METABOLIC PANEL
ALT: 15 U/L (ref 0–53)
AST: 15 U/L (ref 0–37)
Albumin: 2.8 g/dL — ABNORMAL LOW (ref 3.5–5.2)
Alkaline Phosphatase: 113 U/L (ref 39–117)
BUN: 17 mg/dL (ref 6–23)
CO2: 31 mEq/L (ref 19–32)
Calcium: 8.1 mg/dL — ABNORMAL LOW (ref 8.4–10.5)
Chloride: 106 mEq/L (ref 96–112)
Creatinine, Ser: 0.99 mg/dL (ref 0.40–1.50)
GFR: 93.31 mL/min (ref >60.00)
Glucose, Bld: 111 mg/dL — ABNORMAL HIGH (ref 70–99)
Potassium: 3.8 mEq/L (ref 3.5–5.1)
Sodium: 144 mEq/L (ref 135–145)
Total Bilirubin: 0.5 mg/dL (ref 0.2–1.2)
Total Protein: 5.2 g/dL — ABNORMAL LOW (ref 6.0–8.3)

## 2019-12-23 LAB — CBC WITH DIFFERENTIAL/PLATELET
Basophils Absolute: 0 10*3/uL (ref 0.0–0.1)
Basophils Relative: 0.4 % (ref 0.0–3.0)
Eosinophils Absolute: 0.3 10*3/uL (ref 0.0–0.7)
Eosinophils Relative: 5.2 % — ABNORMAL HIGH (ref 0.0–5.0)
HCT: 45.7 % (ref 39.0–52.0)
Hemoglobin: 15 g/dL (ref 13.0–17.0)
Lymphocytes Relative: 29.6 % (ref 12.0–46.0)
Lymphs Abs: 1.9 10*3/uL (ref 0.7–4.0)
MCHC: 32.9 g/dL (ref 30.0–36.0)
MCV: 90.6 fl (ref 78.0–100.0)
Monocytes Absolute: 0.5 10*3/uL (ref 0.1–1.0)
Monocytes Relative: 8 % (ref 3.0–12.0)
Neutro Abs: 3.6 10*3/uL (ref 1.4–7.7)
Neutrophils Relative %: 56.8 % (ref 43.0–77.0)
Platelets: 225 10*3/uL (ref 150.0–400.0)
RBC: 5.05 Mil/uL (ref 4.22–5.81)
RDW: 14.5 % (ref 11.5–15.5)
WBC: 6.3 10*3/uL (ref 4.0–10.5)

## 2019-12-23 LAB — TSH: TSH: 1.78 u[IU]/mL (ref 0.35–4.50)

## 2019-12-23 LAB — PSA: PSA: 1.63 ng/mL (ref 0.10–4.00)

## 2019-12-23 LAB — VITAMIN D 25 HYDROXY (VIT D DEFICIENCY, FRACTURES): VITD: <7 ng/mL — ABNORMAL LOW (ref 30.00–100.00)

## 2019-12-23 LAB — LIPID PANEL
Cholesterol: 395 mg/dL — ABNORMAL HIGH (ref 0–200)
HDL: 90.6 mg/dL (ref >39.00)
LDL Cholesterol: 291 mg/dL — ABNORMAL HIGH (ref 0–99)
NonHDL: 304
Total CHOL/HDL Ratio: 4
Triglycerides: 64 mg/dL (ref 0.0–149.0)
VLDL: 12.8 mg/dL (ref 0.0–40.0)

## 2019-12-23 LAB — VITAMIN B12: Vitamin B-12: 249 pg/mL (ref 211–911)

## 2019-12-23 LAB — HEMOGLOBIN A1C: Hgb A1c MFr Bld: 6.7 % — ABNORMAL HIGH (ref 4.6–6.5)

## 2019-12-23 MED ORDER — CARVEDILOL 6.25 MG PO TABS: 6.2500 mg | ORAL_TABLET | Freq: Two times a day (BID) | ORAL | 1 refills | Status: DC

## 2019-12-23 MED ORDER — VITAMIN D (ERGOCALCIFEROL) 1.25 MG (50000 UNIT) PO CAPS: 50000.0000 [IU] | ORAL_CAPSULE | ORAL | 0 refills | Status: AC

## 2019-12-23 NOTE — Progress Notes (Signed)
Established Patient Office Visit     This visit occurred during the SARS-CoV-2 public health emergency.  Safety protocols were in place, including screening questions prior to the visit, additional usage of staff PPE, and extensive cleaning of exam room while observing appropriate contact time as indicated for disinfecting solutions.    CC/Reason for Visit: Annual preventive exam, discuss some acute concerns  HPI: Samuel Butler is a 59 y.o. male who is coming in today for the above mentioned reasons. Past Medical History is significant for: Hypertension that has been well controlled in the past, hyperlipidemia with intolerance to statins, erectile dysfunction with occasional sildenafil use, inflammatory bowel disease followed by GI on sulfasalazine, prior tobacco abuse, quit 3 years ago, type 2 diabetes that has been diet controlled.  He states in 2020 he gained about 20 to 22 pounds.  He has gone up 2 pant sizes.  He has noticed increased lower extremity swelling.  He has not been keeping to a low-salt diet.  For the past 6 weeks he has been noticing red blood on the toilet paper when he wipes, he is overdue for screening colonoscopy.  He has been having left shoulder pain for a couple years.  No distinct injury that he can recall, he is significantly limited in his activities of daily living because of this.  He needs routine eye and dental care.  He last had a colonoscopy in 2012 and was asked to return in 5 years.   Past Medical/Surgical History: Past Medical History:  Diagnosis Date  . DDD (degenerative disc disease), lumbar    per his report  . Diabetes mellitus without complication ([place])   . Gout 04/16/2013  . Hyperlipidemia   . Hypertension   . IBD (inflammatory bowel disease) 04/05/2016  . Tobacco use disorder 03/28/2015    Past Surgical History:  Procedure Laterality Date  . COLONOSCOPY    . TONSILLECTOMY      Social History:  reports that he quit smoking about 3  years ago. His smoking use included cigarettes. He has never used smokeless tobacco. He reports current alcohol use. He reports that he does not use drugs.  Allergies: Allergies  Allergen Reactions  . Crestor [Rosuvastatin Calcium] Other (See Comments)    Joint aches    Family History:  Family History  Problem Relation Age of Onset  . Hypertension Father   . Diabetes Father   . Stroke Father      Current Outpatient Medications:  .  amLODipine (NORVASC) 10 MG tablet, Take 1 tablet by mouth once daily, Disp: 30 tablet, Rfl: 0 .  carvedilol (COREG) 6.25 MG tablet, Take 1 tablet (6.25 mg total) by mouth 2 (two) times daily with a meal., Disp: 180 tablet, Rfl: 1 .  losartan (COZAAR) 100 MG tablet, TAKE 1 TABLET BY MOUTH ONCE DAILY APPOINTMENT  REQUESTED  FOR  FUTURE  REFILLS, Disp: 90 tablet, Rfl: 1 .  sildenafil (VIAGRA) 50 MG tablet, Take 1 tablet (50 mg total) by mouth daily as needed for erectile dysfunction. Do not take more then 1 dose in 24 hour period., Disp: 10 tablet, Rfl: 0 .  Vitamin D, Ergocalciferol, (DRISDOL) 1.25 MG (50000 UT) CAPS capsule, Take 1 capsule (50,000 Units total) by mouth every 7 (seven) days for 12 doses., Disp: 12 capsule, Rfl: 0  Review of Systems:  Constitutional: Denies fever, chills, diaphoresis, appetite change and fatigue.  HEENT: Denies photophobia, eye pain, redness, hearing loss, ear pain, congestion, sore throat,  rhinorrhea, sneezing, mouth sores, trouble swallowing, neck pain, neck stiffness and tinnitus.   Respiratory: Denies SOB, DOE, cough, chest tightness,  and wheezing.   Cardiovascular: Denies chest pain, palpitations. Gastrointestinal: Denies nausea, vomiting, abdominal pain, diarrhea, constipation, blood in stool and abdominal distention.  Genitourinary: Denies dysuria, urgency, frequency, hematuria, flank pain and difficulty urinating.  Endocrine: Denies: hot or cold intolerance, sweats, changes in hair or nails, polyuria,  polydipsia. Musculoskeletal: Denies myalgias, back pain, joint swelling, arthralgias and gait problem.  Skin: Denies pallor, rash and wound.  Neurological: Denies dizziness, seizures, syncope, weakness, light-headedness, numbness and headaches.  Hematological: Denies adenopathy. Easy bruising, personal or family bleeding history  Psychiatric/Behavioral: Denies suicidal ideation, mood changes, confusion, nervousness, sleep disturbance and agitation    Physical Exam: Vitals:   12/23/19 0717  BP: (!) 130/100  Pulse: 80  Temp: 98.4 F (36.9 C)  TempSrc: Temporal  SpO2: 98%  Weight: 247 lb 1.6 oz (112.1 kg)  Height: 5' 11"  (1.803 m)    Body mass index is 34.46 kg/m.   Constitutional: NAD, calm, comfortable Eyes: PERRL, lids and conjunctivae normal ENMT: Mucous membranes are moist.  Tympanic membrane is pearly white, no erythema or bulging. Neck: normal, supple, no masses, no thyromegaly Respiratory: clear to auscultation bilaterally, no wheezing, no crackles. Normal respiratory effort. No accessory muscle use.  Cardiovascular: Regular rate and rhythm, no murmurs / rubs / gallops.  2+ pitting lower extremity edema. 2+ pedal pulses. No carotid bruits.  Abdomen: no tenderness, no masses palpated. No hepatosplenomegaly. Bowel sounds positive.  Musculoskeletal: Left shoulder exam: No pain to palpation, no crepitus of glenohumeral joint with movement, unable to abduct beyond 90 Degrees Skin: no rashes, lesions, ulcers. No induration Neurologic: CN 2-12 grossly intact. Sensation intact, DTR normal. Strength 5/5 in all 4.  Psychiatric: Normal judgment and insight. Alert and oriented x 3. Normal mood.      Office Visit from 12/23/2019 in [place] at [facility]  PHQ-9 Total Score  0       Impression and Plan:  Encounter for preventive health examination -Have advised routine eye and dental care. -Pneumovax today, otherwise immunizations are up-to-date. -Screening labs  today. -Healthy lifestyle has been discussed in detail including necessity of low-salt diet, increased physical activity and weight loss. -He is overdue for screening colonoscopy, will refer back to GI. -He is overdue for diabetic eye exam, ophthalmology referral has been placed. -PSA for prostate cancer screening today.  Chronic left shoulder pain  -Based on his limited range of motion, I am concerned for rotator cuff tendinopathy. -I will send for MRI of the left shoulder today.  Hematochezia  - Plan: Ambulatory referral to Gastroenterology -CBC to be done today.  Vitamin D deficiency  - Plan: VITAMIN D 25 Hydroxy (Vit-D Deficiency, Fractures)  IBD (inflammatory bowel disease) -Followed by GI.  Tobacco use disorder -Quit 3 years ago.  Diet-controlled type 2 diabetes mellitus ([place]) -Last A1c was 6.2 in December 2020, he is diet controlled at this time.  Essential hypertension, benign  -Uncontrolled. -Increase Coreg from 3.125-6.25 twice daily. -He will return in 8 weeks for follow-up  Hyperlipidemia, unspecified hyperlipidemia type  - Plan: Lipid panel -He has a statin intolerance.   Patient Instructions  -Nice seeing you today!!  -Lab work today; will notify you once results are available.  -Pneumonia vaccine today.  -Referrals to GI and eye doctor today.  -MRI of your left shoulder has been ordered.   Preventive Care 2-56 Years Old, Male Preventive care  refers to lifestyle choices and visits with your health care provider that can promote health and wellness. This includes:  A yearly physical exam. This is also called an annual well check.  Regular dental and eye exams.  Immunizations.  Screening for certain conditions.  Healthy lifestyle choices, such as eating a healthy diet, getting regular exercise, not using drugs or products that contain nicotine and tobacco, and limiting alcohol use. What can I expect for my preventive care visit? Physical  exam Your health care provider will check:  Height and weight. These may be used to calculate body mass index (BMI), which is a measurement that tells if you are at a healthy weight.  Heart rate and blood pressure.  Your skin for abnormal spots. Counseling Your health care provider may ask you questions about:  Alcohol, tobacco, and drug use.  Emotional well-being.  Home and relationship well-being.  Sexual activity.  Eating habits.  Work and work Statistician. What immunizations do I need?  Influenza (flu) vaccine  This is recommended every year. Tetanus, diphtheria, and pertussis (Tdap) vaccine  You may need a Td booster every 10 years. Varicella (chickenpox) vaccine  You may need this vaccine if you have not already been vaccinated. Zoster (shingles) vaccine  You may need this after age 78. Measles, mumps, and rubella (MMR) vaccine  You may need at least one dose of MMR if you were born in 1957 or later. You may also need a second dose. Pneumococcal conjugate (PCV13) vaccine  You may need this if you have certain conditions and were not previously vaccinated. Pneumococcal polysaccharide (PPSV23) vaccine  You may need one or two doses if you smoke cigarettes or if you have certain conditions. Meningococcal conjugate (MenACWY) vaccine  You may need this if you have certain conditions. Hepatitis A vaccine  You may need this if you have certain conditions or if you travel or work in places where you may be exposed to hepatitis A. Hepatitis B vaccine  You may need this if you have certain conditions or if you travel or work in places where you may be exposed to hepatitis B. Haemophilus influenzae type b (Hib) vaccine  You may need this if you have certain risk factors. Human papillomavirus (HPV) vaccine  If recommended by your health care provider, you may need three doses over 6 months. You may receive vaccines as individual doses or as more than one vaccine  together in one shot (combination vaccines). Talk with your health care provider about the risks and benefits of combination vaccines. What tests do I need? Blood tests  Lipid and cholesterol levels. These may be checked every 5 years, or more frequently if you are over 33 years old.  Hepatitis C test.  Hepatitis B test. Screening  Lung cancer screening. You may have this screening every year starting at age 91 if you have a 30-pack-year history of smoking and currently smoke or have quit within the past 15 years.  Prostate cancer screening. Recommendations will vary depending on your family history and other risks.  Colorectal cancer screening. All adults should have this screening starting at age 38 and continuing until age 49. Your health care provider may recommend screening at age 58 if you are at increased risk. You will have tests every 1-10 years, depending on your results and the type of screening test.  Diabetes screening. This is done by checking your blood sugar (glucose) after you have not eaten for a while (fasting). You may have  this done every 1-3 years.  Sexually transmitted disease (STD) testing. Follow these instructions at home: Eating and drinking  Eat a diet that includes fresh fruits and vegetables, whole grains, lean protein, and low-fat dairy products.  Take vitamin and mineral supplements as recommended by your health care provider.  Do not drink alcohol if your health care provider tells you not to drink.  If you drink alcohol: ? Limit how much you have to 0-2 drinks a day. ? Be aware of how much alcohol is in your drink. In the [place] one drink equals one 12 oz bottle of beer (355 mL), one 5 oz glass of wine (148 mL), or one 1 oz glass of hard liquor (44 mL). Lifestyle  Take daily care of your teeth and gums.  Stay active. Exercise for at least 30 minutes on 5 or more days each week.  Do not use any products that contain nicotine or tobacco, such as  cigarettes, e-cigarettes, and chewing tobacco. If you need help quitting, ask your health care provider.  If you are sexually active, practice safe sex. Use a condom or other form of protection to prevent STIs (sexually transmitted infections).  Talk with your health care provider about taking a low-dose aspirin every day starting at age 76. What's next?  Go to your health care provider once a year for a well check visit.  Ask your health care provider how often you should have your eyes and teeth checked.  Stay up to date on all vaccines. This information is not intended to replace advice given to you by your health care provider. Make sure you discuss any questions you have with your health care provider. Document Revised: 10/22/2018 Document Reviewed: 10/22/2018 Elsevier Patient Education  2020 [place], [place] [facility]

## 2019-12-23 NOTE — Addendum Note (Signed)
Addended by: Westley Hummer B on: 12/23/2019 04:51 PM   Modules accepted: Orders

## 2019-12-23 NOTE — Patient Instructions (Signed)
-Nice seeing you today!!  -Lab work today; will notify you once results are available.  -Pneumonia vaccine today.  -Referrals to GI and eye doctor today.  -MRI of your left shoulder has been ordered.   Preventive Care 26-60 Years Old, Male Preventive care refers to lifestyle choices and visits with your health care provider that can promote health and wellness. This includes:  A yearly physical exam. This is also called an annual well check.  Regular dental and eye exams.  Immunizations.  Screening for certain conditions.  Healthy lifestyle choices, such as eating a healthy diet, getting regular exercise, not using drugs or products that contain nicotine and tobacco, and limiting alcohol use. What can I expect for my preventive care visit? Physical exam Your health care provider will check:  Height and weight. These may be used to calculate body mass index (BMI), which is a measurement that tells if you are at a healthy weight.  Heart rate and blood pressure.  Your skin for abnormal spots. Counseling Your health care provider may ask you questions about:  Alcohol, tobacco, and drug use.  Emotional well-being.  Home and relationship well-being.  Sexual activity.  Eating habits.  Work and work Statistician. What immunizations do I need?  Influenza (flu) vaccine  This is recommended every year. Tetanus, diphtheria, and pertussis (Tdap) vaccine  You may need a Td booster every 10 years. Varicella (chickenpox) vaccine  You may need this vaccine if you have not already been vaccinated. Zoster (shingles) vaccine  You may need this after age 85. Measles, mumps, and rubella (MMR) vaccine  You may need at least one dose of MMR if you were born in 1957 or later. You may also need a second dose. Pneumococcal conjugate (PCV13) vaccine  You may need this if you have certain conditions and were not previously vaccinated. Pneumococcal polysaccharide (PPSV23) vaccine   You may need one or two doses if you smoke cigarettes or if you have certain conditions. Meningococcal conjugate (MenACWY) vaccine  You may need this if you have certain conditions. Hepatitis A vaccine  You may need this if you have certain conditions or if you travel or work in places where you may be exposed to hepatitis A. Hepatitis B vaccine  You may need this if you have certain conditions or if you travel or work in places where you may be exposed to hepatitis B. Haemophilus influenzae type b (Hib) vaccine  You may need this if you have certain risk factors. Human papillomavirus (HPV) vaccine  If recommended by your health care provider, you may need three doses over 6 months. You may receive vaccines as individual doses or as more than one vaccine together in one shot (combination vaccines). Talk with your health care provider about the risks and benefits of combination vaccines. What tests do I need? Blood tests  Lipid and cholesterol levels. These may be checked every 5 years, or more frequently if you are over 83 years old.  Hepatitis C test.  Hepatitis B test. Screening  Lung cancer screening. You may have this screening every year starting at age 11 if you have a 30-pack-year history of smoking and currently smoke or have quit within the past 15 years.  Prostate cancer screening. Recommendations will vary depending on your family history and other risks.  Colorectal cancer screening. All adults should have this screening starting at age 75 and continuing until age 55. Your health care provider may recommend screening at age 12 if you  are at increased risk. You will have tests every 1-10 years, depending on your results and the type of screening test.  Diabetes screening. This is done by checking your blood sugar (glucose) after you have not eaten for a while (fasting). You may have this done every 1-3 years.  Sexually transmitted disease (STD) testing. Follow these  instructions at home: Eating and drinking  Eat a diet that includes fresh fruits and vegetables, whole grains, lean protein, and low-fat dairy products.  Take vitamin and mineral supplements as recommended by your health care provider.  Do not drink alcohol if your health care provider tells you not to drink.  If you drink alcohol: ? Limit how much you have to 0-2 drinks a day. ? Be aware of how much alcohol is in your drink. In the [place] one drink equals one 12 oz bottle of beer (355 mL), one 5 oz glass of wine (148 mL), or one 1 oz glass of hard liquor (44 mL). Lifestyle  Take daily care of your teeth and gums.  Stay active. Exercise for at least 30 minutes on 5 or more days each week.  Do not use any products that contain nicotine or tobacco, such as cigarettes, e-cigarettes, and chewing tobacco. If you need help quitting, ask your health care provider.  If you are sexually active, practice safe sex. Use a condom or other form of protection to prevent STIs (sexually transmitted infections).  Talk with your health care provider about taking a low-dose aspirin every day starting at age 69. What's next?  Go to your health care provider once a year for a well check visit.  Ask your health care provider how often you should have your eyes and teeth checked.  Stay up to date on all vaccines. This information is not intended to replace advice given to you by your health care provider. Make sure you discuss any questions you have with your health care provider. Document Revised: 10/22/2018 Document Reviewed: 10/22/2018 Elsevier Patient Education  2020 [employer].

## 2019-12-24 ENCOUNTER — Other Ambulatory Visit: Payer: Self-pay | Admitting: Internal Medicine

## 2019-12-24 DIAGNOSIS — E559 Vitamin D deficiency, unspecified: Secondary | ICD-10-CM

## 2020-01-07 ENCOUNTER — Other Ambulatory Visit: Payer: Self-pay | Admitting: Internal Medicine

## 2020-01-07 DIAGNOSIS — I10 Essential (primary) hypertension: Secondary | ICD-10-CM

## 2020-01-18 ENCOUNTER — Encounter: Payer: Self-pay | Admitting: Internal Medicine

## 2020-01-18 DIAGNOSIS — H524 Presbyopia: Secondary | ICD-10-CM | POA: Diagnosis not present

## 2020-01-18 DIAGNOSIS — E119 Type 2 diabetes mellitus without complications: Secondary | ICD-10-CM | POA: Diagnosis not present

## 2020-01-18 DIAGNOSIS — H353111 Nonexudative age-related macular degeneration, right eye, early dry stage: Secondary | ICD-10-CM | POA: Diagnosis not present

## 2020-01-18 LAB — HM DIABETES EYE EXAM

## 2020-01-19 ENCOUNTER — Telehealth: Payer: Self-pay | Admitting: *Deleted

## 2020-01-19 ENCOUNTER — Encounter: Payer: Self-pay | Admitting: Internal Medicine

## 2020-01-19 NOTE — Telephone Encounter (Signed)
Returned call to patient, appointment scheduled.

## 2020-01-19 NOTE — Telephone Encounter (Signed)
Spoke with patient and he complains of hypertension with edema in his legs and feet.  An appointment was offered, but the patient declined.  Patient states he will call back and schedule an appointment.

## 2020-01-20 ENCOUNTER — Encounter: Payer: Self-pay | Admitting: Internal Medicine

## 2020-01-20 ENCOUNTER — Encounter: Payer: Self-pay | Admitting: Family

## 2020-01-20 ENCOUNTER — Other Ambulatory Visit: Payer: Self-pay

## 2020-01-20 ENCOUNTER — Ambulatory Visit: Payer: BC Managed Care – PPO | Admitting: Family

## 2020-01-20 ENCOUNTER — Ambulatory Visit (INDEPENDENT_AMBULATORY_CARE_PROVIDER_SITE_OTHER): Payer: BC Managed Care – PPO | Admitting: Family

## 2020-01-20 VITALS — BP 140/100 | HR 91 | Temp 97.4°F | Wt 252.3 lb

## 2020-01-20 DIAGNOSIS — R609 Edema, unspecified: Secondary | ICD-10-CM | POA: Diagnosis not present

## 2020-01-20 DIAGNOSIS — I10 Essential (primary) hypertension: Secondary | ICD-10-CM

## 2020-01-20 MED ORDER — HYDROCHLOROTHIAZIDE 25 MG PO TABS: 25.0000 mg | ORAL_TABLET | Freq: Every day | ORAL | 3 refills | Status: DC

## 2020-01-20 NOTE — Patient Instructions (Addendum)
Decrease Amlodipine to 1/2 tab (5mg ) once daily Start HCTZ 25 mg once daily in the mornings.     Edema  Edema is when you have too much fluid in your body or under your skin. Edema may make your legs, feet, and ankles swell up. Swelling is also common in looser tissues, like around your eyes. This is a common condition. It gets more common as you get older. There are many possible causes of edema. Eating too much salt (sodium) and being on your feet or sitting for a long time can cause edema in your legs, feet, and ankles. Hot weather may make edema worse. Edema is usually painless. Your skin may look swollen or shiny. Follow these instructions at home:  Keep the swollen body part raised (elevated) above the level of your heart when you are sitting or lying down.  Do not sit still or stand for a long time.  Do not wear tight clothes. Do not wear garters on your upper legs.  Exercise your legs. This can help the swelling go down.  Wear elastic bandages or support stockings as told by your doctor.  Eat a low-salt (low-sodium) diet to reduce fluid as told by your doctor.  Depending on the cause of your swelling, you may need to limit how much fluid you drink (fluid restriction).  Take over-the-counter and prescription medicines only as told by your doctor. Contact a doctor if:  Treatment is not working.  You have heart, liver, or kidney disease and have symptoms of edema.  You have sudden and unexplained weight gain. Get help right away if:  You have shortness of breath or chest pain.  You cannot breathe when you lie down.  You have pain, redness, or warmth in the swollen areas.  You have heart, liver, or kidney disease and get edema all of a sudden.  You have a fever and your symptoms get worse all of a sudden. Summary  Edema is when you have too much fluid in your body or under your skin.  Edema may make your legs, feet, and ankles swell up. Swelling is also common in  looser tissues, like around your eyes.  Raise (elevate) the swollen body part above the level of your heart when you are sitting or lying down.  Follow your doctor's instructions about diet and how much fluid you can drink (fluid restriction). This information is not intended to replace advice given to you by your health care provider. Make sure you discuss any questions you have with your health care provider. Document Revised: 10/31/2017 Document Reviewed: 11/15/2016 Elsevier Patient Education  2020 [employer].

## 2020-01-23 NOTE — Progress Notes (Signed)
Established Patient Office Visit  Subjective:  Patient ID: Samuel Butler, male    DOB: November 09, 1961  Age: 59 y.o. MRN: [id number]  CC:  Chief Complaint  Patient presents with  . Edema  . Joint Pain    HPI Samuel Butler presents with c/o swollen legs and joint pain. Swelling has been ongoing for months but is worsening. Samuel Butler works as a Programmer, systems and at the end of his work day, his legs are even more swollen and painful . Samuel Butler has a history of HTN and takes Norvasc 10mg  and Coreg 6.25 mg. Samuel Butler is concerned that his blood pressure is up and his weight is as well. Blood pressures 140-150/95-100 at home. Samuel Butler does admit to eating more recently. Has a history of osteoarthritis and has daily joint pain, 5/10. No swelling of the joints or redness.   Past Medical History:  Diagnosis Date  . DDD (degenerative disc disease), lumbar    per his report  . Diabetes mellitus without complication ([place])   . Gout 04/16/2013  . Hyperlipidemia   . Hypertension   . IBD (inflammatory bowel disease) 04/05/2016  . Tobacco use disorder 03/28/2015    Past Surgical History:  Procedure Laterality Date  . COLONOSCOPY    . TONSILLECTOMY      Family History  Problem Relation Age of Onset  . Hypertension Father   . Diabetes Father   . Stroke Father     Social History   Socioeconomic History  . Marital status: Married    Spouse name: Not on file  . Number of children: Not on file  . Years of education: Not on file  . Highest education level: Not on file  Occupational History  . Not on file  Tobacco Use  . Smoking status: Former Smoker    Types: Cigarettes    Quit date: 10/23/2016    Years since quitting: 3.2  . Smokeless tobacco: Never Used  . Tobacco comment: per patient 5 cigarettes a day   Substance and Sexual Activity  . Alcohol use: Yes    Comment: occasional  . Drug use: No  . Sexual activity: Not on file  Other Topics Concern  . 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 Difficulty of Paying Living Expenses:   Food Insecurity:   . Worried About Charity fundraiser in the Last Year:   . Arboriculturist in the Last Year:   Transportation Needs:   . Film/video editor (Medical):   Marland Kitchen Lack of Transportation (Non-Medical):   Physical Activity:   . Days of Exercise per Week:   . Minutes of Exercise per Session:   Stress:   . Feeling of Stress :   Social Connections:   . Frequency of Communication with Friends and Family:   . Frequency of Social Gatherings with Friends and Family:   . Attends Religious Services:   . Active Member of Clubs or Organizations:   . Attends Archivist Meetings:   Marland Kitchen Marital Status:   Intimate Partner Violence:   . Fear of Current or Ex-Partner:   . Emotionally  Abused:   Marland Kitchen Physically Abused:   . Sexually Abused:     Outpatient Medications Prior to Visit  Medication Sig Dispense Refill  . amLODipine (NORVASC) 10 MG tablet Take 1 tablet by mouth once daily 30 tablet 0  . carvedilol (COREG) 6.25 MG tablet Take 1 tablet (6.25 mg total) by mouth 2 (two) times daily with a meal. 180 tablet 1  . losartan (COZAAR) 100 MG tablet TAKE 1 TABLET BY MOUTH ONCE DAILY APPOINTMENT  REQUESTED  FOR  FUTURE  REFILLS 90 tablet 1  . sildenafil (VIAGRA) 50 MG tablet Take 1 tablet (50 mg total) by mouth daily as needed for erectile dysfunction. Do not take more then 1 dose in 24 hour period. 10 tablet 0  . Vitamin D, Ergocalciferol, (DRISDOL) 1.25 MG (50000 UNIT) CAPS capsule Take 1 capsule (50,000 Units total) by mouth every 7 (seven) days for 12 doses. 12 capsule 0   No facility-administered medications prior to visit.    Allergies  Allergen Reactions  . Crestor [Rosuvastatin Calcium]  Other (See Comments)    Joint aches    ROS Review of Systems  Constitutional: Negative.   Respiratory: Negative.   Cardiovascular: Positive for leg swelling. Negative for chest pain and palpitations.  Gastrointestinal: Negative.   Endocrine: Negative.   Musculoskeletal: Positive for arthralgias and back pain.  Skin: Negative.   Neurological: Negative.   Hematological: Negative.   Psychiatric/Behavioral: Negative.       Objective:    Physical Exam  Constitutional: Samuel Butler is oriented to person, place, and time. Samuel Butler appears well-developed and well-nourished.  Cardiovascular: Normal rate, regular rhythm and normal heart sounds.  Pulmonary/Chest: Effort normal and breath sounds normal.  Abdominal: Soft. Bowel sounds are normal.  Musculoskeletal:        General: Edema present. Normal range of motion.     Cervical back: Normal range of motion and neck supple.     Comments: 2+ pitting edema above the knees extending to the feet and ankles. Mildly tender.  Neurological: Samuel Butler is alert and oriented to person, place, and time.  Skin: Skin is warm and dry.  Psychiatric: Samuel Butler has a normal mood and affect.    BP (!) 140/100 (BP Location: Left Arm, Patient Position: Sitting, Cuff Size: Large)   Pulse 91   Temp (!) 97.4 F (36.3 C) (Temporal)   Wt 252 lb 4.8 oz (114.4 kg)   SpO2 96%   BMI 35.19 kg/m  Wt Readings from Last 3 Encounters:  01/20/20 252 lb 4.8 oz (114.4 kg)  12/23/19 247 lb 1.6 oz (112.1 kg)  10/26/19 245 lb 4.8 oz (111.3 kg)     Health Maintenance Due  Topic Date Due  . FOOT EXAM  02/11/2019    There are no preventive care reminders to display for this patient.  Lab Results  Component Value Date   TSH 1.78 12/23/2019   Lab Results  Component Value Date   WBC 6.3 12/23/2019   HGB 15.0 12/23/2019   HCT 45.7 12/23/2019   MCV 90.6 12/23/2019   PLT 225.0 12/23/2019   Lab Results  Component Value Date   NA 144 12/23/2019   K 3.8 12/23/2019   CO2 31 12/23/2019    GLUCOSE 111 (H) 12/23/2019   BUN 17 12/23/2019   CREATININE 0.99 12/23/2019   BILITOT 0.5 12/23/2019   ALKPHOS 113 12/23/2019   AST 15 12/23/2019   ALT 15 12/23/2019   PROT 5.2 (L) 12/23/2019   ALBUMIN 2.8 (L) 12/23/2019  CALCIUM 8.1 (L) 12/23/2019   GFR 93.31 12/23/2019   Lab Results  Component Value Date   CHOL 395 (H) 12/23/2019   Lab Results  Component Value Date   HDL 90.60 12/23/2019   Lab Results  Component Value Date   LDLCALC 291 (H) 12/23/2019   Lab Results  Component Value Date   TRIG 64.0 12/23/2019   Lab Results  Component Value Date   CHOLHDL 4 12/23/2019   Lab Results  Component Value Date   HGBA1C 6.7 (H) 12/23/2019      Assessment & Plan:   Problem List Items Addressed This Visit    Essential hypertension, benign   Relevant Medications   hydrochlorothiazide (HYDRODIURIL) 25 MG tablet    Other Visit Diagnoses    Peripheral edema    -  Primary      Meds ordered this encounter  Medications  . hydrochlorothiazide (HYDRODIURIL) 25 MG tablet    Sig: Take 1 tablet (25 mg total) by mouth daily.    Dispense:  30 tablet    Refill:  3   Decrease Norvasc to 5mg . I strongly suspect the 10 mg dosage of Norvasc is contributing to the peripheral edema. Add HCTZ25 mg once daily. Recheck in 1 week. A lot of the pain that Samuel Butler is complaining about today is related to the significant swelling. Tylenol for now. Will consider additional options in 1 week  Sayf was seen today for edema and joint pain.  Diagnoses and all orders for this visit:  Peripheral edema  Essential hypertension, benign  Other orders -     hydrochlorothiazide (HYDRODIURIL) 25 MG tablet; Take 1 tablet (25 mg total) by mouth daily.     Follow-up: Return in about 1 week (around 01/27/2020).    Kennyth Arnold, FNP

## 2020-01-27 ENCOUNTER — Other Ambulatory Visit: Payer: Self-pay

## 2020-01-27 ENCOUNTER — Ambulatory Visit: Payer: BC Managed Care – PPO | Admitting: Family

## 2020-01-27 ENCOUNTER — Encounter: Payer: Self-pay | Admitting: Family

## 2020-01-27 VITALS — BP 130/98 | HR 78 | Temp 97.7°F | Wt 247.2 lb

## 2020-01-27 DIAGNOSIS — R609 Edema, unspecified: Secondary | ICD-10-CM

## 2020-01-27 DIAGNOSIS — M160 Bilateral primary osteoarthritis of hip: Secondary | ICD-10-CM

## 2020-01-27 DIAGNOSIS — I10 Essential (primary) hypertension: Secondary | ICD-10-CM | POA: Diagnosis not present

## 2020-01-27 MED ORDER — AMLODIPINE BESYLATE 5 MG PO TABS: 5.0000 mg | ORAL_TABLET | Freq: Every day | ORAL | 1 refills | Status: DC

## 2020-01-27 MED ORDER — LOSARTAN POTASSIUM-HCTZ 100-25 MG PO TABS: 1.0000 | ORAL_TABLET | Freq: Every day | ORAL | 1 refills | Status: DC

## 2020-01-27 NOTE — Progress Notes (Signed)
Established Patient Office Visit  Subjective:  Patient ID: Samuel Butler, male    DOB: [birth year]  Age: 59 y.o. MRN: [id number]  CC:  Chief Complaint  Patient presents with  . Follow-up    HPI Samuel Butler presents for a recheck of peripheral edema in the legs bilaterally. At his last OV, we decreased his Norvasc to 5 mg which has helped his swelling significantly. He is also tolerating the HCTZ that was added. He is concerned that he does not have the best blood pressure control despite taking medications on a regular basis.   Also has concerns of joint pain in his hips, knees, and shoulders bilaterally. The left side of the body is worse than the right. Pain 8/10. He takes an Aleve in the evening after work that helps. Worse when he has been sitting an extended period of time. Better when up and moving around. He is afraid to take too many NSAIDS and does not really want additional medication for it  Past Medical History:  Diagnosis Date  . DDD (degenerative disc disease), lumbar    per his report  . Diabetes mellitus without complication ([place])   . Gout 04/16/2013  . Hyperlipidemia   . Hypertension   . IBD (inflammatory bowel disease) 04/05/2016  . Tobacco use disorder 03/28/2015    Past Surgical History:  Procedure Laterality Date  . COLONOSCOPY    . TONSILLECTOMY      Family History  Problem Relation Age of Onset  . Hypertension Father   . Diabetes Father   . Stroke Father     Social History   Socioeconomic History  . Marital status: Married    Spouse name: Not on file  . Number of children: Not on file  . Years of education: Not on file  . Highest education level: Not on file  Occupational History  . Not on file  Tobacco Use  . Smoking status: Former Smoker    Types: Cigarettes    Quit date: 10/23/2016    Years since quitting: 3.2  . Smokeless tobacco: Never Used  . Tobacco comment: per patient 5 cigarettes a day   Substance and Sexual Activity  .  Alcohol use: Yes    Comment: occasional  . Drug use: No  . Sexual activity: Not on file  Other Topics Concern  . 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 Difficulty of Paying Living Expenses:   Food Insecurity:   . Worried About Charity fundraiser in the Last Year:   . Arboriculturist in the Last Year:   Transportation Needs:   . Film/video editor (Medical):   Marland Kitchen Lack of Transportation (Non-Medical):   Physical Activity:   . Days of Exercise per Week:   . Minutes of Exercise per Session:   Stress:   . Feeling of Stress :   Social Connections:   . Frequency of Communication with Friends and Family:   . Frequency of Social Gatherings with Friends and Family:   . Attends Religious Services:   . Active Member of Clubs or Organizations:   . Attends Club  or Organization Meetings:   Marland Kitchen Marital Status:   Intimate Partner Violence:   . Fear of Current or Ex-Partner:   . Emotionally Abused:   Marland Kitchen Physically Abused:   . Sexually Abused:     Outpatient Medications Prior to Visit  Medication Sig Dispense Refill  . carvedilol (COREG) 6.25 MG tablet Take 1 tablet (6.25 mg total) by mouth 2 (two) times daily with a meal. 180 tablet 1  . sildenafil (VIAGRA) 50 MG tablet Take 1 tablet (50 mg total) by mouth daily as needed for erectile dysfunction. Do not take more then 1 dose in 24 hour period. 10 tablet 0  . Vitamin D, Ergocalciferol, (DRISDOL) 1.25 MG (50000 UNIT) CAPS capsule Take 1 capsule (50,000 Units total) by mouth every 7 (seven) days for 12 doses. 12 capsule 0  . amLODipine (NORVASC) 10 MG tablet Take 1 tablet by mouth once daily 30 tablet 0  . hydrochlorothiazide (HYDRODIURIL) 25 MG tablet Take 1 tablet (25 mg total) by mouth  daily. 30 tablet 3  . losartan (COZAAR) 100 MG tablet TAKE 1 TABLET BY MOUTH ONCE DAILY APPOINTMENT  REQUESTED  FOR  FUTURE  REFILLS 90 tablet 1   No facility-administered medications prior to visit.    Allergies  Allergen Reactions  . Crestor [Rosuvastatin Calcium] Other (See Comments)    Joint aches    ROS Review of Systems  Constitutional: Negative.   Cardiovascular: Positive for leg swelling. Negative for chest pain and palpitations.       Improved  Musculoskeletal: Positive for arthralgias.       Hips, shoulder, and knee pain   Skin: Negative.   Neurological: Negative.   Psychiatric/Behavioral: Negative.   All other systems reviewed and are negative.     Objective:    Physical Exam  Constitutional: He is oriented to person, place, and time. He appears well-developed and well-nourished.  HENT:  Mouth/Throat: Oropharynx is clear and moist.  Cardiovascular: Normal rate, regular rhythm and normal heart sounds.  Pulmonary/Chest: Breath sounds normal.  Abdominal: Soft. Bowel sounds are normal.  Musculoskeletal:        General: Normal range of motion.     Cervical back: Normal range of motion and neck supple.     Comments: 1+ pitting edema   Neurological: He is alert and oriented to person, place, and time.  Skin: Skin is warm and dry.  Psychiatric: He has a normal mood and affect.    BP (!) 130/98 (BP Location: Right Arm, Patient Position: Sitting, Cuff Size: Large)   Pulse 78   Temp 97.7 F (36.5 C) (Temporal)   Wt 247 lb 3.2 oz (112.1 kg)   SpO2 98%   BMI 34.48 kg/m  Wt Readings from Last 3 Encounters:  01/27/20 247 lb 3.2 oz (112.1 kg)  01/20/20 252 lb 4.8 oz (114.4 kg)  12/23/19 247 lb 1.6 oz (112.1 kg)     Health Maintenance Due  Topic Date Due  . FOOT EXAM  02/11/2019    There are no preventive care reminders to display for this patient.  Lab Results  Component Value Date   TSH 1.78 12/23/2019   Lab Results  Component Value Date   WBC 6.3  12/23/2019   HGB 15.0 12/23/2019   HCT 45.7 12/23/2019   MCV 90.6 12/23/2019   PLT 225.0 12/23/2019   Lab Results  Component Value Date   NA 144 12/23/2019   K 3.8 12/23/2019   CO2 31 12/23/2019   GLUCOSE 111 (H)  12/23/2019   BUN 17 12/23/2019   CREATININE 0.99 12/23/2019   BILITOT 0.5 12/23/2019   ALKPHOS 113 12/23/2019   AST 15 12/23/2019   ALT 15 12/23/2019   PROT 5.2 (L) 12/23/2019   ALBUMIN 2.8 (L) 12/23/2019   CALCIUM 8.1 (L) 12/23/2019   GFR 93.31 12/23/2019   Lab Results  Component Value Date   CHOL 395 (H) 12/23/2019   Lab Results  Component Value Date   HDL 90.60 12/23/2019   Lab Results  Component Value Date   LDLCALC 291 (H) 12/23/2019   Lab Results  Component Value Date   TRIG 64.0 12/23/2019   Lab Results  Component Value Date   CHOLHDL 4 12/23/2019   Lab Results  Component Value Date   HGBA1C 6.7 (H) 12/23/2019      Assessment & Plan:   Problem List Items Addressed This Visit    Essential hypertension, benign - Primary   Relevant Medications   amLODipine (NORVASC) 5 MG tablet   losartan-hydrochlorothiazide (HYZAAR) 100-25 MG tablet    Other Visit Diagnoses    Peripheral edema       Primary osteoarthritis of both hips          Meds ordered this encounter  Medications  . amLODipine (NORVASC) 5 MG tablet    Sig: Take 1 tablet (5 mg total) by mouth daily.    Dispense:  90 tablet    Refill:  1  . losartan-hydrochlorothiazide (HYZAAR) 100-25 MG tablet    Sig: Take 1 tablet by mouth daily.    Dispense:  90 tablet    Refill:  1    Sayyid was seen today for follow-up.  Diagnoses and all orders for this visit:  Essential hypertension, benign  Peripheral edema  Primary osteoarthritis of both hips  Other orders -     amLODipine (NORVASC) 5 MG tablet; Take 1 tablet (5 mg total) by mouth daily. -     losartan-hydrochlorothiazide (HYZAAR) 100-25 MG tablet; Take 1 tablet by mouth daily.   Follow-up: Return in about 4 weeks  (around 02/24/2020).  Since he has only been on the current medication regimen x 1 week. No changes made. Will recheck in 4 weeks. We may have to consider a replacement for Norvasc 5 mg. Patient would also like to talk to Dr. Jerilee Hoh about his joint pain. For now, continue Aleve as needed or Glucosamine. Also discussed the possibility of Voltaren gel if he is concerned about ingesting too many NSAIDS  Kennyth Arnold, FNP

## 2020-01-27 NOTE — Patient Instructions (Addendum)
Low-Sodium Eating Plan Sodium, which is an element that makes up salt, helps you maintain a healthy balance of fluids in your body. Too much sodium can increase your blood pressure and cause fluid and waste to be held in your body. Your health care provider or dietitian may recommend following this plan if you have high blood pressure (hypertension), kidney disease, liver disease, or heart failure. Eating less sodium can help lower your blood pressure, reduce swelling, and protect your heart, liver, and kidneys. What are tips for following this plan? General guidelines  Most people on this plan should limit their sodium intake to 1,500-2,000 mg (milligrams) of sodium each day. Reading food labels   The Nutrition Facts label lists the amount of sodium in one serving of the food. If you eat more than one serving, you must multiply the listed amount of sodium by the number of servings.  Choose foods with less than 140 mg of sodium per serving.  Avoid foods with 300 mg of sodium or more per serving. Shopping  Look for lower-sodium products, often labeled as "low-sodium" or "no salt added."  Always check the sodium content even if foods are labeled as "unsalted" or "no salt added".  Buy fresh foods. ? Avoid canned foods and premade or frozen meals. ? Avoid canned, cured, or processed meats  Buy breads that have less than 80 mg of sodium per slice. Cooking  Eat more home-cooked food and less restaurant, buffet, and fast food.  Avoid adding salt when cooking. Use salt-free seasonings or herbs instead of table salt or sea salt. Check with your health care provider or pharmacist before using salt substitutes.  Cook with plant-based oils, such as canola, sunflower, or olive oil. Meal planning  When eating at a restaurant, ask that your food be prepared with less salt or no salt, if possible.  Avoid foods that contain MSG (monosodium glutamate). MSG is sometimes added to Chinese food,  bouillon, and some canned foods. What foods are recommended? The items listed may not be a complete list. Talk with your dietitian about what dietary choices are best for you. Grains Low-sodium cereals, including oats, puffed wheat and rice, and shredded wheat. Low-sodium crackers. Unsalted rice. Unsalted pasta. Low-sodium bread. Whole-grain breads and whole-grain pasta. Vegetables Fresh or frozen vegetables. "No salt added" canned vegetables. "No salt added" tomato sauce and paste. Low-sodium or reduced-sodium tomato and vegetable juice. Fruits Fresh, frozen, or canned fruit. Fruit juice. Meats and other protein foods Fresh or frozen (no salt added) meat, poultry, seafood, and fish. Low-sodium canned tuna and salmon. Unsalted nuts. Dried peas, beans, and lentils without added salt. Unsalted canned beans. Eggs. Unsalted nut butters. Dairy Milk. Soy milk. Cheese that is naturally low in sodium, such as ricotta cheese, fresh mozzarella, or Swiss cheese Low-sodium or reduced-sodium cheese. Cream cheese. Yogurt. Fats and oils Unsalted butter. Unsalted margarine with no trans fat. Vegetable oils such as canola or olive oils. Seasonings and other foods Fresh and dried herbs and spices. Salt-free seasonings. Low-sodium mustard and ketchup. Sodium-free salad dressing. Sodium-free light mayonnaise. Fresh or refrigerated horseradish. Lemon juice. Vinegar. Homemade, reduced-sodium, or low-sodium soups. Unsalted popcorn and pretzels. Low-salt or salt-free chips. What foods are not recommended? The items listed may not be a complete list. Talk with your dietitian about what dietary choices are best for you. Grains Instant hot cereals. Bread stuffing, pancake, and biscuit mixes. Croutons. Seasoned rice or pasta mixes. Noodle soup cups. Boxed or frozen macaroni and cheese. Regular salted   crackers. Self-rising flour. Vegetables Sauerkraut, pickled vegetables, and relishes. Olives. [place] fries. Onion rings.  Regular canned vegetables (not low-sodium or reduced-sodium). Regular canned tomato sauce and paste (not low-sodium or reduced-sodium). Regular tomato and vegetable juice (not low-sodium or reduced-sodium). Frozen vegetables in sauces. Meats and other protein foods Meat or fish that is salted, canned, smoked, spiced, or pickled. Bacon, ham, sausage, hotdogs, corned beef, chipped beef, packaged lunch meats, salt pork, jerky, pickled herring, anchovies, regular canned tuna, sardines, salted nuts. Dairy Processed cheese and cheese spreads. Cheese curds. Blue cheese. Feta cheese. String cheese. Regular cottage cheese. Buttermilk. Canned milk. Fats and oils Salted butter. Regular margarine. Ghee. Bacon fat. Seasonings and other foods Onion salt, garlic salt, seasoned salt, table salt, and sea salt. Canned and packaged gravies. Worcestershire sauce. Tartar sauce. Barbecue sauce. Teriyaki sauce. Soy sauce, including reduced-sodium. Steak sauce. Fish sauce. Oyster sauce. Cocktail sauce. Horseradish that you find on the shelf. Regular ketchup and mustard. Meat flavorings and tenderizers. Bouillon cubes. Hot sauce and Tabasco sauce. Premade or packaged marinades. Premade or packaged taco seasonings. Relishes. Regular salad dressings. Salsa. Potato and tortilla chips. Corn chips and puffs. Salted popcorn and pretzels. Canned or dried soups. Pizza. Frozen entrees and pot pies. Summary  Eating less sodium can help lower your blood pressure, reduce swelling, and protect your heart, liver, and kidneys.  Most people on this plan should limit their sodium intake to 1,500-2,000 mg (milligrams) of sodium each day.  Canned, boxed, and frozen foods are high in sodium. Restaurant foods, fast foods, and pizza are also very high in sodium. You also get sodium by adding salt to food.  Try to cook at home, eat more fresh fruits and vegetables, and eat less fast food, canned, processed, or prepared foods. This information is  not intended to replace advice given to you by your health care provider. Make sure you discuss any questions you have with your health care provider. Document Revised: 10/10/2017 Document Reviewed: 10/21/2016 Elsevier Patient Education  2020 [place].    Osteoarthritis  Osteoarthritis is a type of arthritis that affects tissue that covers the ends of bones in joints (cartilage). Cartilage acts as a cushion between the bones and helps them move smoothly. Osteoarthritis results when cartilage in the joints gets worn down. Osteoarthritis is sometimes called "wear and tear" arthritis. Osteoarthritis is the most common form of arthritis. It often occurs in older people. It is a condition that gets worse over time (a progressive condition). Joints that are most often affected by this condition are in:  Fingers.  Toes.  Hips.  Knees.  Spine, including neck and lower back. What are the causes? This condition is caused by age-related wearing down of cartilage that covers the ends of bones. What increases the risk? The following factors may make you more likely to develop this condition:  Older age.  Being overweight or obese.  Overuse of joints, such as in athletes.  Past injury of a joint.  Past surgery on a joint.  Family history of osteoarthritis. What are the signs or symptoms? The main symptoms of this condition are pain, swelling, and stiffness in the joint. The joint may lose its shape over time. Small pieces of bone or cartilage may break off and float inside of the joint, which may cause more pain and damage to the joint. Small deposits of bone (osteophytes) may grow on the edges of the joint. Other symptoms may include:  A grating or scraping feeling inside the joint  when you move it.  Popping or creaking sounds when you move. Symptoms may affect one or more joints. Osteoarthritis in a major joint, such as your knee or hip, can make it painful to walk or exercise. If  you have osteoarthritis in your hands, you might not be able to grip items, twist your hand, or control small movements of your hands and fingers (fine motor skills). How is this diagnosed? This condition may be diagnosed based on:  Your medical history.  A physical exam.  Your symptoms.  X-rays of the affected joint(s).  Blood tests to rule out other types of arthritis. How is this treated? There is no cure for this condition, but treatment can help to control pain and improve joint function. Treatment plans may include:  A prescribed exercise program that allows for rest and joint relief. You may work with a physical therapist.  A weight control plan.  Pain relief techniques, such as: ? Applying heat and cold to the joint. ? Electric pulses delivered to nerve endings under the skin (transcutaneous electrical nerve stimulation, or TENS). ? Massage. ? Certain nutritional supplements.  NSAIDs or prescription medicines to help relieve pain.  Medicine to help relieve pain and inflammation (corticosteroids). This can be given by mouth (orally) or as an injection.  Assistive devices, such as a brace, wrap, splint, specialized glove, or cane.  Surgery, such as: ? An osteotomy. This is done to reposition the bones and relieve pain or to remove loose pieces of bone and cartilage. ? Joint replacement surgery. You may need this surgery if you have very bad (advanced) osteoarthritis. Follow these instructions at home: Activity  Rest your affected joints as directed by your health care provider.  Do not drive or use heavy machinery while taking prescription pain medicine.  Exercise as directed. Your health care provider or physical therapist may recommend specific types of exercise, such as: ? Strengthening exercises. These are done to strengthen the muscles that support joints that are affected by arthritis. They can be performed with weights or with exercise bands to add  resistance. ? Aerobic activities. These are exercises, such as brisk walking or water aerobics, that get your heart pumping. ? Range-of-motion activities. These keep your joints easy to move. ? Balance and agility exercises. Managing pain, stiffness, and swelling      If directed, apply heat to the affected area as often as told by your health care provider. Use the heat source that your health care provider recommends, such as a moist heat pack or a heating pad. ? If you have a removable assistive device, remove it as told by your health care provider. ? Place a towel between your skin and the heat source. If your health care provider tells you to keep the assistive device on while you apply heat, place a towel between the assistive device and the heat source. ? Leave the heat on for 20-30 minutes. ? Remove the heat if your skin turns bright red. This is especially important if you are unable to feel pain, heat, or cold. You may have a greater risk of getting burned.  If directed, put ice on the affected joint: ? If you have a removable assistive device, remove it as told by your health care provider. ? Put ice in a plastic bag. ? Place a towel between your skin and the bag. If your health care provider tells you to keep the assistive device on during icing, place a towel between the assistive  device and the bag. ? Leave the ice on for 20 minutes, 2-3 times a day. General instructions  Take over-the-counter and prescription medicines only as told by your health care provider.  Maintain a healthy weight. Follow instructions from your health care provider for weight control. These may include dietary restrictions.  Do not use any products that contain nicotine or tobacco, such as cigarettes and e-cigarettes. These can delay bone healing. If you need help quitting, ask your health care provider.  Use assistive devices as directed by your health care provider.  Keep all follow-up visits as  told by your health care provider. This is important. Where to find more information  [employer] of Arthritis and Musculoskeletal and Skin Diseases: www.niams.SouthExposed.es  [employer] on Aging: http://kim-miller.com/  American College of Rheumatology: www.rheumatology.org Contact a health care provider if:  Your skin turns red.  You develop a rash.  You have pain that gets worse.  You have a fever along with joint or muscle aches. Get help right away if:  You lose a lot of weight.  You suddenly lose your appetite.  You have night sweats. Summary  Osteoarthritis is a type of arthritis that affects tissue covering the ends of bones in joints (cartilage).  This condition is caused by age-related wearing down of cartilage that covers the ends of bones.  The main symptom of this condition is pain, swelling, and stiffness in the joint.  There is no cure for this condition, but treatment can help to control pain and improve joint function. This information is not intended to replace advice given to you by your health care provider. Make sure you discuss any questions you have with your health care provider. Document Revised: 10/10/2017 Document Reviewed: 07/01/2016 Elsevier Patient Education  2020 [employer].

## 2020-01-31 ENCOUNTER — Other Ambulatory Visit: Payer: Self-pay | Admitting: Family Medicine

## 2020-02-17 ENCOUNTER — Ambulatory Visit: Payer: BC Managed Care – PPO | Admitting: Internal Medicine

## 2020-02-17 ENCOUNTER — Other Ambulatory Visit: Payer: Self-pay

## 2020-02-17 ENCOUNTER — Encounter: Payer: Self-pay | Admitting: Internal Medicine

## 2020-02-17 VITALS — BP 129/85 | HR 81 | Ht 71.0 in | Wt 247.2 lb

## 2020-02-17 DIAGNOSIS — I739 Peripheral vascular disease, unspecified: Secondary | ICD-10-CM | POA: Diagnosis not present

## 2020-02-17 DIAGNOSIS — R600 Localized edema: Secondary | ICD-10-CM | POA: Diagnosis not present

## 2020-02-17 DIAGNOSIS — E785 Hyperlipidemia, unspecified: Secondary | ICD-10-CM | POA: Diagnosis not present

## 2020-02-17 DIAGNOSIS — R0600 Dyspnea, unspecified: Secondary | ICD-10-CM

## 2020-02-17 MED ORDER — ATORVASTATIN CALCIUM 40 MG PO TABS: 40.0000 mg | ORAL_TABLET | Freq: Every day | ORAL | 3 refills | Status: DC

## 2020-02-17 NOTE — Patient Instructions (Signed)
Medication Instructions:  START atorvastatin 40mg  daily Continue other current medications  *If you need a refill on your cardiac medications before your next appointment, please call your pharmacy*   Testing/Procedures: Lower Extremity Arterial Doppler - Dr. Lysbeth Penner office  Echocardiogram - [address] Floor   Follow-Up: At [facility], you and your health needs are our priority.  As part of our continuing mission to provide you with exceptional heart care, we have created designated Provider Care Teams.  These Care Teams include your primary Cardiologist (physician) and Advanced Practice Providers (APPs -  Physician Assistants and Nurse Practitioners) who all work together to provide you with the care you need, when you need it.  We recommend signing up for the patient portal called "MyChart".  Sign up information is provided on this After Visit Summary.  MyChart is used to connect with patients for Virtual Visits (Telemedicine).  Patients are able to view lab/test results, encounter notes, upcoming appointments, etc.  Non-urgent messages can be sent to your provider as well.   To learn more about what you can do with MyChart, go to [email or web address].    Your next appointment:   4 month(s)  The format for your next appointment:   In Person  Provider:   K. Mali Hilty, MD   Other Instructions

## 2020-02-18 ENCOUNTER — Other Ambulatory Visit (HOSPITAL_COMMUNITY): Payer: Self-pay | Admitting: Internal Medicine

## 2020-02-18 ENCOUNTER — Encounter: Payer: Self-pay | Admitting: Internal Medicine

## 2020-02-18 DIAGNOSIS — I739 Peripheral vascular disease, unspecified: Secondary | ICD-10-CM

## 2020-02-18 NOTE — Progress Notes (Signed)
LIPID CLINIC CONSULT NOTE  Chief Complaint:  Dyslipidemia, leg pain, swelling, possible claudication  Primary Care Physician: Samuel Butler, Samuel Halsted, MD  Primary Cardiologist:  No primary care provider on file.  HPI:  Samuel Butler is a 59 y.o. male who is being seen today for the evaluation of dyslipidemia and other issues at the request of Samuel Butler, Samuel Butler*.  This is a pleasant 59 year old male kindly referred evaluation and management of dyslipidemia.  He has a history of marked dyslipidemia going back many years and has been tried on statin therapy.  More recently he was placed on Crestor which caused nice drop in his cholesterol.  LDL came down into the 150 range, but he had significant hand cramping and other side effects including worsening joint aches, particularly in his knees and discontinue the medicine.  Subsequent to that he also gained about 20 or 30 pounds and his LDL cholesterol has now gone up to 291.  The recent lab from a month ago showed total cholesterol 395, triglycerides 64, HDL 90 and LDL 291.  Notably a year ago his HDL was only 57 on statin therapy.  This is not unusual as we know statins due to lower HDL cholesterol.  I am concerned about his lipid profile, however because it is very suggestive of familial hyperlipidemia.  In addition has been complaining of leg pain, swelling and possible claudication.  He says he gets pain in his legs when he walks a certain distance that improves with rest.  The pain is described as shooting or burning.  He is also had swelling and this has been getting worse over the past several months to the point where he is not able to perform his job or do a number of chores in the yard such as mowing his lawn.  He describes bilateral knee osteoarthritis and was a basketball player for a number of years and therefore does get episodes of knee swelling with overuse.  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  He says when he gets out of the seat he can "barely walk".  More recently he is finally had good control over his blood pressure however he said he had uncontrolled hypertension for years.  He is currently on amlodipine 5, carvedilol 6.25 twice daily and losartan HCTZ 100/25 mg daily.  PMHx:  Past Medical History:  Diagnosis Date  . DDD (degenerative disc disease), lumbar    per his report  . Diabetes mellitus without complication ([name])   . Gout 04/16/2013  . Hyperlipidemia   . Hypertension   . IBD (inflammatory bowel disease) 04/05/2016  . Tobacco use disorder 03/28/2015    Past Surgical History:  Procedure Laterality Date  . COLONOSCOPY    . TONSILLECTOMY      FAMHx:  Family History  Problem Relation Age of Onset  . Hypertension Father   . Diabetes Father   . Stroke Father     SOCHx:   reports that he quit smoking about 3 years ago. His smoking use included cigarettes. He has never used smokeless tobacco. He reports current alcohol use. He reports that he does not use drugs.  ALLERGIES:  Allergies  Allergen Reactions  . Crestor [Rosuvastatin Calcium] Other (See Comments)    Joint aches    ROS: Pertinent items noted in HPI and remainder of comprehensive ROS otherwise negative.  HOME MEDS: Current  Outpatient Medications on File Prior to Visit  Medication Sig Dispense Refill  . amLODipine (NORVASC) 5 MG tablet Take 1 tablet (5 mg total) by mouth daily. 90 tablet 1  . carvedilol (COREG) 6.25 MG tablet Take 1 tablet (6.25 mg total) by mouth 2 (two) times daily with a meal. 180 tablet 1  . losartan-hydrochlorothiazide (HYZAAR) 100-25 MG tablet Take 1 tablet by mouth daily. 90 tablet 1  . sildenafil (VIAGRA) 50 MG tablet Take 1 tablet (50 mg total) by mouth daily as needed for erectile dysfunction. Do not take more then 1 dose in 24 hour period. 10 tablet 0  . Vitamin D, Ergocalciferol, (DRISDOL) 1.25 MG (50000 UNIT)  CAPS capsule Take 1 capsule (50,000 Units total) by mouth every 7 (seven) days for 12 doses. 12 capsule 0   No current facility-administered medications on file prior to visit.    LABS/IMAGING: No results found for this or any previous visit (from the past 48 hour(s)). No results found.  LIPID PANEL:    Component Value Date/Time   CHOL 395 (H) 12/23/2019 0752   TRIG 64.0 12/23/2019 0752   HDL 90.60 12/23/2019 0752   CHOLHDL 4 12/23/2019 0752   VLDL 12.8 12/23/2019 0752   LDLCALC 291 (H) 12/23/2019 0752   LDLDIRECT 144.7 10/08/2013 0822    WEIGHTS: Wt Readings from Last 3 Encounters:  02/17/20 247 lb 3.2 oz (112.1 kg)  01/27/20 247 lb 3.2 oz (112.1 kg)  01/20/20 252 lb 4.8 oz (114.4 kg)    VITALS: BP 129/85   Pulse 81   Ht 5\' 11"  (1.803 m)   Wt 247 lb 3.2 oz (112.1 kg)   SpO2 96%   BMI 34.48 kg/m   EXAM: General appearance: alert, no distress and moderately obese Neck: no carotid bruit, no JVD and thyroid not enlarged, symmetric, no tenderness/mass/nodules Lungs: clear to auscultation bilaterally Heart: regular rate and rhythm, S1, S2 normal, no murmur, click, rub or gallop Abdomen: soft, non-tender; bowel sounds normal; no masses,  no organomegaly and Obese Extremities: edema 2+ bilateral pitting edema, no venous stasis changes and Cool Pulses: Faint DP pulses in the feet Skin: Skin color, texture, turgor normal. No rashes or lesions Neurologic: Grossly normal Psych: Pleasant  EKG: Deferred  ASSESSMENT: 1. Significant dyslipidemia, possible familial hyperlipidemia 2. Statin intolerant-myalgias/muscle cramping 3. Progressive lower extremity edema, weakness and possible claudication 4. Hypertension 5. Dyspnea on exertion  PLAN: 1.   Mr. Valenzano has had progressive lower extremity edema, weakness and possible symptoms of claudication.  I suspect he also has knee osteoarthritis and has had effusions in the past.  His edema is impressive and could be related to  calcium channel blocker therapy or perhaps venous insufficiency.  I am also concerned about possible arterial insufficiency with symptoms that sound like claudication although may be pseudoclaudication are related to knee osteoarthritis.  I would like to get bilateral lower extremity arterial Dopplers.  In addition we will check an echocardiogram to rule out any heart failure symptoms, as he has had some dyspnea on exertion as well.  He failed rosuvastatin, I would recommend starting atorvastatin 40 mg daily.  If he has significant intolerance to this, then would likely consider PCSK9 inhibitor.  Further coronary evaluation may be based on his PAD work-up and echo results.  Plan follow-up with me afterwards.  Thanks again for the kind referral.  Pixie Casino, MD, FACC, [place] Director of the [facility]  [facility] Diplomate of the American Board of Clinical Lipidology Attending Cardiologist  Direct Dial: [phone]  Fax: [phone]  Website:  [email or web address] 02/18/2020, 10:14 AM

## 2020-02-28 ENCOUNTER — Other Ambulatory Visit: Payer: Self-pay

## 2020-02-28 ENCOUNTER — Ambulatory Visit (HOSPITAL_COMMUNITY)
Admission: RE | Admit: 2020-02-28 | Discharge: 2020-02-28 | Disposition: A | Discharge status: Home / Self Care | Payer: BC Managed Care – PPO | Source: Ambulatory Visit | Attending: Cardiovascular Disease | Admitting: Cardiovascular Disease

## 2020-02-28 DIAGNOSIS — R600 Localized edema: Secondary | ICD-10-CM | POA: Insufficient documentation

## 2020-02-28 DIAGNOSIS — I739 Peripheral vascular disease, unspecified: Secondary | ICD-10-CM | POA: Insufficient documentation

## 2020-03-03 ENCOUNTER — Ambulatory Visit (HOSPITAL_COMMUNITY): Payer: BC Managed Care – PPO | Attending: Cardiovascular Disease

## 2020-03-03 ENCOUNTER — Other Ambulatory Visit: Payer: Self-pay

## 2020-03-03 DIAGNOSIS — R600 Localized edema: Secondary | ICD-10-CM | POA: Diagnosis not present

## 2020-03-03 DIAGNOSIS — R0600 Dyspnea, unspecified: Secondary | ICD-10-CM | POA: Diagnosis not present

## 2020-03-08 ENCOUNTER — Encounter: Payer: Self-pay | Admitting: Cardiovascular Disease

## 2020-03-08 ENCOUNTER — Other Ambulatory Visit: Payer: Self-pay

## 2020-03-08 ENCOUNTER — Ambulatory Visit (INDEPENDENT_AMBULATORY_CARE_PROVIDER_SITE_OTHER): Payer: BC Managed Care – PPO | Admitting: Cardiovascular Disease

## 2020-03-08 DIAGNOSIS — I739 Peripheral vascular disease, unspecified: Secondary | ICD-10-CM | POA: Insufficient documentation

## 2020-03-08 NOTE — Assessment & Plan Note (Signed)
Mr. Pheng was referred to me by Dr. Debara Pickett for symptomatic PAD.  He said bilateral calf claudication for last 6 months.  His risk factors include hypertension hyperlipidemia and tobacco abuse.  He had Doppler studies performed 02/28/2020 revealing a right ABI of 0.76 and a left of 0.87.  He did have a high-frequency signal in his mid right SFA and tibial disease on the left.  We talked about endovascular therapy.  He wishes to discuss this with his wife before proceeding.

## 2020-03-08 NOTE — Patient Instructions (Signed)
Medication Instructions:  The current medical regimen is effective;  continue present plan and medications.  *If you need a refill on your cardiac medications before your next appointment, please call your pharmacy*   Follow-Up: At [facility], you and your health needs are our priority.  As part of our continuing mission to provide you with exceptional heart care, we have created designated Provider Care Teams.  These Care Teams include your primary Cardiologist (physician) and Advanced Practice Providers (APPs -  Physician Assistants and Nurse Practitioners) who all work together to provide you with the care you need, when you need it.  We recommend signing up for the patient portal called "MyChart".  Sign up information is provided on this After Visit Summary.  MyChart is used to connect with patients for Virtual Visits (Telemedicine).  Patients are able to view lab/test results, encounter notes, upcoming appointments, etc.  Non-urgent messages can be sent to your provider as well.   To learn more about what you can do with MyChart, go to [email or web address].    Your next appointment:   As needed  The format for your next appointment:   In Person  Provider:   Quay Burow, MD   Other Instructions Please call us when you know if you would like to continue with plan for PV ANGIO.

## 2020-03-08 NOTE — Progress Notes (Signed)
Married    03/08/2020 Beckam A Sheu   August 01, 1961  [id number]  Primary Physician Isaac Bliss, Rayford Halsted, MD Primary Cardiologist: Lorretta Harp MD [name], [place]  HPI:  Samuel Butler is a 59 y.o. 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  He was referred by Dr. Debara Pickett for peripheral vascular valuation because of lifestyle limiting claudication.  History factors include just continued tobacco use for years ago when he was smoking 2 packs a week for the last 40 years, treated hypertension and hyperlipidemia.  Is not diabetic.  His mother did die of a myocardial infarction in her 33s.  He was referred to Dr. Debara Pickett because of peripheral edema and hyper lipidemia.  A work-up is in progress.  He was prescribed a statin which she could not tolerate.  He has had lifestyle limiting claudication for last 6 months.  Dopplers performed 02/28/2020 revealed a right ABI of 0.76 and a left of 0.87 with a high-frequency signal in his mid right SFA and left tibial vessel disease.   Current Meds  Medication Sig  . amLODipine (NORVASC) 5 MG tablet Take 1 tablet (5 mg total) by mouth daily.  . carvedilol (COREG) 6.25 MG tablet Take 1 tablet (6.25 mg total) by mouth 2 (two) times daily with a meal.  . losartan-hydrochlorothiazide (HYZAAR) 100-25 MG tablet Take 1 tablet by mouth daily.  . sildenafil (VIAGRA) 50 MG tablet Take 1 tablet (50 mg total) by mouth daily as needed for erectile dysfunction. Do not take more then 1 dose in 24 hour period.  . Vitamin D, Ergocalciferol, (DRISDOL) 1.25 MG (50000 UNIT) CAPS capsule Take 1 capsule (50,000 Units total) by mouth every 7 (seven) days for 12 doses.     Allergies  Allergen Reactions  . Crestor [Rosuvastatin Calcium] Other (See Comments)    Joint aches    Social History   Socioeconomic History  . Marital status: Married    Spouse name: Not on file  . Number of children:  Not on file  . Years of education: Not on file  . Highest education level: Not on file  Occupational History  . Not on file  Tobacco Use  . Smoking status: Former Smoker    Types: Cigarettes    Quit date: 10/23/2016    Years since quitting: 3.3  . Smokeless tobacco: Never Used  . Tobacco comment: per patient 5 cigarettes a day   Substance and Sexual Activity  . Alcohol use: Yes    Comment: occasional  . Drug use: No  . Sexual activity: Not on file  Other Topics Concern  . 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 Difficulty of Paying Living Expenses:   Food Insecurity:   . Worried About Charity fundraiser in the Last Year:   . Arboriculturist in the Last Year:   Transportation Needs:   . Film/video editor (Medical):   Marland Kitchen Lack of Transportation (Non-Medical):   Physical Activity:   . Days of Exercise per Week:   . Minutes of Exercise per Session:  Stress:   . Feeling of Stress :   Social Connections:   . Frequency of Communication with Friends and Family:   . Frequency of Social Gatherings with Friends and Family:   . Attends Religious Services:   . Active Member of Clubs or Organizations:   . Attends Archivist Meetings:   Marland Kitchen Marital Status:   Intimate Partner Violence:   . Fear of Current or Ex-Partner:   . Emotionally Abused:   Marland Kitchen Physically Abused:   . Sexually Abused:      Review of Systems: General: negative for chills, fever, night sweats or weight changes.  Cardiovascular: negative for chest pain, dyspnea on exertion, edema, orthopnea, palpitations, paroxysmal nocturnal dyspnea or shortness of breath Dermatological: negative for rash Respiratory: negative for cough or wheezing Urologic:  negative for hematuria Abdominal: negative for nausea, vomiting, diarrhea, bright red blood per rectum, melena, or hematemesis Neurologic: negative for visual changes, syncope, or dizziness All other systems reviewed and are otherwise negative except as noted above.    Blood pressure 122/84, pulse 83, height 5\' 11"  (1.803 m), weight 241 lb 9.6 oz (109.6 kg).  General appearance: alert and no distress Neck: no adenopathy, no carotid bruit, no JVD, supple, symmetrical, trachea midline and thyroid not enlarged, symmetric, no tenderness/mass/nodules Lungs: clear to auscultation bilaterally Heart: regular rate and rhythm, S1, S2 normal, no murmur, click, rub or gallop Extremities: extremities normal, atraumatic, no cyanosis or edema Pulses: Diminished pedal pulses Skin: Skin color, texture, turgor normal. No rashes or lesions Neurologic: Alert and oriented X 3, normal strength and tone. Normal symmetric reflexes. Normal coordination and gait  EKG sinus rhythm at 83 with evidence of LVH and QS pattern in leads V1 and V2 consistent with old septal infarct with left axis deviation.  I personally reviewed this EKG.  ASSESSMENT AND PLAN:   Peripheral arterial disease [facility]) Mr. Casselman was referred to me by Dr. Debara Pickett for symptomatic PAD.  He said bilateral calf claudication for last 6 months.  His risk factors include hypertension hyperlipidemia and tobacco abuse.  He had Doppler studies performed 02/28/2020 revealing a right ABI of 0.76 and a left of 0.87.  He did have a high-frequency signal in his mid right SFA and tibial disease on the left.  We talked about endovascular therapy.  He wishes to discuss this with his wife before proceeding.      Lorretta Harp MD FACP,FACC,FAHA, [facility] 03/08/2020 4:00 PM

## 2020-03-22 ENCOUNTER — Other Ambulatory Visit: Payer: Self-pay

## 2020-03-23 ENCOUNTER — Other Ambulatory Visit (INDEPENDENT_AMBULATORY_CARE_PROVIDER_SITE_OTHER): Payer: BC Managed Care – PPO

## 2020-03-23 DIAGNOSIS — E559 Vitamin D deficiency, unspecified: Secondary | ICD-10-CM

## 2020-03-24 ENCOUNTER — Other Ambulatory Visit: Payer: Self-pay | Admitting: Internal Medicine

## 2020-03-24 DIAGNOSIS — E559 Vitamin D deficiency, unspecified: Secondary | ICD-10-CM

## 2020-03-24 LAB — VITAMIN D 25 HYDROXY (VIT D DEFICIENCY, FRACTURES): VITD: 20.75 ng/mL — ABNORMAL LOW (ref 30.00–100.00)

## 2020-03-24 MED ORDER — VITAMIN D (ERGOCALCIFEROL) 1.25 MG (50000 UNIT) PO CAPS: 50000.0000 [IU] | ORAL_CAPSULE | ORAL | 0 refills | Status: DC

## 2020-04-28 ENCOUNTER — Encounter (HOSPITAL_COMMUNITY): Payer: Self-pay

## 2020-04-28 ENCOUNTER — Ambulatory Visit (HOSPITAL_COMMUNITY)
Admission: EM | Admit: 2020-04-28 | Discharge: 2020-04-28 | Disposition: A | Discharge status: Home / Self Care | Payer: BC Managed Care – PPO | Attending: Family Medicine | Admitting: Family Medicine

## 2020-04-28 ENCOUNTER — Other Ambulatory Visit: Payer: Self-pay

## 2020-04-28 DIAGNOSIS — B356 Tinea cruris: Secondary | ICD-10-CM | POA: Diagnosis not present

## 2020-04-28 DIAGNOSIS — L309 Dermatitis, unspecified: Secondary | ICD-10-CM

## 2020-04-28 MED ORDER — TRIAMCINOLONE ACETONIDE 0.1 % EX CREA
1.0000 | TOPICAL_CREAM | Freq: Two times a day (BID) | CUTANEOUS | 0 refills | Status: DC
Start: 2020-04-28 — End: 2020-07-14

## 2020-04-28 MED ORDER — CLOTRIMAZOLE 1 % EX CREA
TOPICAL_CREAM | CUTANEOUS | 0 refills | Status: DC
Start: 2020-04-28 — End: 2020-07-14

## 2020-04-28 NOTE — ED Triage Notes (Signed)
Pt presents with rash and swelling on groin area X 1 week; pt states he has pain and bleeding at times as well.

## 2020-04-29 NOTE — ED Provider Notes (Signed)
[place]   [id number] 04/28/20 Arrival Time: 1942  ASSESSMENT & PLAN:  1. Tinea cruris   2. Eczema, unspecified type     Meds ordered this encounter  Medications  . clotrimazole (LOTRIMIN) 1 % cream    Sig: Apply to affected area 2 times daily    Dispense:  60 g    Refill:  0  . triamcinolone cream (KENALOG) 0.1 %    Sig: Apply 1 application topically 2 (two) times daily. To your hands.    Dispense:  30 g    Refill:  0      Will follow up with PCP or here if worsening or failing to improve as anticipated. Reviewed expectations re: course of current medical issues. Questions answered. Outlined signs and symptoms indicating need for more acute intervention. Patient verbalized understanding. After Visit Summary given.   SUBJECTIVE:  Samuel Butler is a 59 y.o. male who presents with a skin complaint. Groin with irritated rash. Itchy and painful at times; weepy. Gradual onset past week. Also itchy rash of hands over the past few weeks; washes frequently at work. No OTC tx.  OBJECTIVE: Vitals:   04/28/20 1957  BP: 138/84  Pulse: 92  Resp: 17  Temp: 98.6 F (37 C)  TempSrc: Oral  SpO2: 97%    General appearance: alert; no distress Extremities: no edema; moves all extremities normally Skin: warm and dry; significant tinea cruris without signs of bacterial infection; dry skin and eczema of bilateral hands Psychological: alert and cooperative; normal mood and affect  Allergies  Allergen Reactions  . Crestor [Rosuvastatin Calcium] Other (See Comments)    Joint aches    Past Medical History:  Diagnosis Date  . DDD (degenerative disc disease), lumbar    per his report  . Diabetes mellitus without complication ([place])   . Gout 04/16/2013  . Hyperlipidemia   . Hypertension   . IBD (inflammatory bowel disease) 04/05/2016  . Tobacco use disorder 03/28/2015   Social History   Socioeconomic History  . Marital status: Married    Spouse name: Not on file   . Number of children: Not on file  . Years of education: Not on file  . Highest education level: Not on file  Occupational History  . Not on file  Tobacco Use  . Smoking status: Former Smoker    Types: Cigarettes    Quit date: 10/23/2016    Years since quitting: 3.5  . Smokeless tobacco: Never Used  . Tobacco comment: per patient 5 cigarettes a day   Vaping Use  . Vaping Use: Never used  Substance and Sexual Activity  . Alcohol use: Yes    Comment: occasional  . Drug use: No  . Sexual activity: Not on file  Other Topics Concern  . 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 Difficulty of Paying Living Expenses:   Food Insecurity:   . Worried About Charity fundraiser in the Last Year:   . Arboriculturist in the Last Year:   Transportation Needs:   . Film/video editor (Medical):   Marland Kitchen Lack of Transportation (Non-Medical):  Physical Activity:   . Days of Exercise per Week:   . Minutes of Exercise per Session:   Stress:   . Feeling of Stress :   Social Connections:   . Frequency of Communication with Friends and Family:   . Frequency of Social Gatherings with Friends and Family:   . Attends Religious Services:   . Active Member of Clubs or Organizations:   . Attends Archivist Meetings:   Marland Kitchen Marital Status:   Intimate Partner Violence:   . Fear of Current or Ex-Partner:   . Emotionally Abused:   Marland Kitchen Physically Abused:   . Sexually Abused:    Family History  Problem Relation Age of Onset  . Hypertension Father   . Diabetes Father   . Stroke Father    Past Surgical History:  Procedure Laterality Date  . COLONOSCOPY    . Evonnie Dawes, MD 04/29/20 1007

## 2020-05-15 ENCOUNTER — Other Ambulatory Visit: Payer: Self-pay | Admitting: Family

## 2020-05-17 ENCOUNTER — Other Ambulatory Visit: Payer: Self-pay | Admitting: Family

## 2020-05-25 ENCOUNTER — Encounter: Payer: Self-pay | Admitting: Internal Medicine

## 2020-05-25 ENCOUNTER — Ambulatory Visit (INDEPENDENT_AMBULATORY_CARE_PROVIDER_SITE_OTHER): Payer: BC Managed Care – PPO | Admitting: Internal Medicine

## 2020-05-25 ENCOUNTER — Other Ambulatory Visit: Payer: Self-pay

## 2020-05-25 ENCOUNTER — Telehealth: Payer: Self-pay | Admitting: Cardiovascular Disease

## 2020-05-25 VITALS — BP 110/84 | HR 89 | Temp 98.8°F | Wt 242.4 lb

## 2020-05-25 DIAGNOSIS — I10 Essential (primary) hypertension: Secondary | ICD-10-CM

## 2020-05-25 DIAGNOSIS — E119 Type 2 diabetes mellitus without complications: Secondary | ICD-10-CM | POA: Diagnosis not present

## 2020-05-25 DIAGNOSIS — F17200 Nicotine dependence, unspecified, uncomplicated: Secondary | ICD-10-CM

## 2020-05-25 DIAGNOSIS — E1159 Type 2 diabetes mellitus with other circulatory complications: Secondary | ICD-10-CM

## 2020-05-25 DIAGNOSIS — I739 Peripheral vascular disease, unspecified: Secondary | ICD-10-CM

## 2020-05-25 DIAGNOSIS — E785 Hyperlipidemia, unspecified: Secondary | ICD-10-CM | POA: Diagnosis not present

## 2020-05-25 LAB — POCT GLYCOSYLATED HEMOGLOBIN (HGB A1C): Hemoglobin A1C: 6 % — AB (ref 4.0–5.6)

## 2020-05-25 MED ORDER — ATORVASTATIN CALCIUM 80 MG PO TABS: 80.0000 mg | ORAL_TABLET | Freq: Every day | ORAL | 1 refills | Status: DC

## 2020-05-25 MED ORDER — AMLODIPINE BESYLATE 5 MG PO TABS: 5.0000 mg | ORAL_TABLET | Freq: Every day | ORAL | 1 refills | Status: DC

## 2020-05-25 MED ORDER — METFORMIN HCL 500 MG PO TABS: 500.0000 mg | ORAL_TABLET | Freq: Every day | ORAL | 1 refills | Status: DC

## 2020-05-25 NOTE — Telephone Encounter (Signed)
Spoke with pt and is aware needs to be seen within a month to have any procedures done. Appt made with Dr Gwenlyn Found for 06/14/20 at 3:45 pm .Samuel Butler Housekeeper

## 2020-05-25 NOTE — Telephone Encounter (Signed)
New Message  Patient is calling in to schedule procedure with Dr. Gwenlyn Found. Please give patient a call back to schedule.

## 2020-05-25 NOTE — Patient Instructions (Signed)
-  Nice seeing you today!!  -Lab work today; will notify you once results are available.  -Make sure you touch base with Dr. Gwenlyn Found about the circulation in your legs.  -Start lipitor 80 mg at bedtime for your cholesterol.  -Start metformin 500 mg in the morning for your diabetes.  -Schedule follow up in 3 months.  -Make sure you call the GI office back for your colonoscopy.

## 2020-05-25 NOTE — Progress Notes (Signed)
Established Patient Office Visit     This visit occurred during the SARS-CoV-2 public health emergency.  Safety protocols were in place, including screening questions prior to the visit, additional usage of staff PPE, and extensive cleaning of exam room while observing appropriate contact time as indicated for disinfecting solutions.    CC/Reason for Visit: Follow-up chronic conditions, discuss some acute concerns  HPI: Samuel Butler is a 59 y.o. male who is coming in today for the above mentioned reasons. Past Medical History is significant for: Hypertension, hyperlipidemia, erectile dysfunction, inflammatory bowel disease followed by GI on sulfasalazine, prior history of tobacco abuse but quit about 3 to 4 years ago, type 2 diabetes that has been diet controlled until recently.  He is having severe leg pain.  He would like to know why.  Review of chart indicates that he has severe peripheral arterial disease with claudication, he had seen Dr. Gwenlyn Found and was told he would benefit from stenting procedure, however he never followed up with them.  He was also scheduled for colonoscopy as he was seeing bright red blood on the toilet paper when he wiped, he continues to have this issue.  He received a call back from GI but never scheduled this appointment.  He also has severe hyperlipidemia with an LDL of 291.  He tried cholesterol only for couple days and discontinued due to dizziness, he did not apprise [place] of this decision.    Past Medical/Surgical History: Past Medical History:  Diagnosis Date  . DDD (degenerative disc disease), lumbar    per his report  . Diabetes mellitus without complication ([place])   . Gout 04/16/2013  . Hyperlipidemia   . Hypertension   . IBD (inflammatory bowel disease) 04/05/2016  . Tobacco use disorder 03/28/2015    Past Surgical History:  Procedure Laterality Date  . COLONOSCOPY    . TONSILLECTOMY      Social History:  reports that he quit smoking about 3  years ago. His smoking use included cigarettes. He has never used smokeless tobacco. He reports current alcohol use. He reports that he does not use drugs.  Allergies: Allergies  Allergen Reactions  . Crestor [Rosuvastatin Calcium] Other (See Comments)    Joint aches    Family History:  Family History  Problem Relation Age of Onset  . Hypertension Father   . Diabetes Father   . Stroke Father      Current Outpatient Medications:  .  amLODipine (NORVASC) 5 MG tablet, Take 1 tablet (5 mg total) by mouth daily., Disp: 90 tablet, Rfl: 1 .  carvedilol (COREG) 6.25 MG tablet, Take 1 tablet (6.25 mg total) by mouth 2 (two) times daily with a meal., Disp: 180 tablet, Rfl: 1 .  clotrimazole (LOTRIMIN) 1 % cream, Apply to affected area 2 times daily, Disp: 60 g, Rfl: 0 .  losartan-hydrochlorothiazide (HYZAAR) 100-25 MG tablet, Take 1 tablet by mouth daily., Disp: 90 tablet, Rfl: 1 .  sildenafil (VIAGRA) 50 MG tablet, Take 1 tablet (50 mg total) by mouth daily as needed for erectile dysfunction. Do not take more then 1 dose in 24 hour period., Disp: 10 tablet, Rfl: 0 .  triamcinolone cream (KENALOG) 0.1 %, Apply 1 application topically 2 (two) times daily. To your hands., Disp: 30 g, Rfl: 0 .  Vitamin D, Ergocalciferol, (DRISDOL) 1.25 MG (50000 UNIT) CAPS capsule, Take 1 capsule (50,000 Units total) by mouth every 7 (seven) days for 12 doses., Disp: 12 capsule, Rfl: 0 .  atorvastatin (LIPITOR) 80 MG tablet, Take 1 tablet (80 mg total) by mouth daily., Disp: 90 tablet, Rfl: 1 .  metFORMIN (GLUCOPHAGE) 500 MG tablet, Take 1 tablet (500 mg total) by mouth daily with breakfast., Disp: 90 tablet, Rfl: 1  Review of Systems:  Constitutional: Denies fever, chills, diaphoresis, appetite change and fatigue.  HEENT: Denies photophobia, eye pain, redness, hearing loss, ear pain, congestion, sore throat, rhinorrhea, sneezing, mouth sores, trouble swallowing, neck pain, neck stiffness and tinnitus.     Respiratory: Denies SOB, DOE, cough, chest tightness,  and wheezing.   Cardiovascular: Denies chest pain, palpitations and leg swelling.  Gastrointestinal: Denies nausea, vomiting, abdominal pain, diarrhea, constipation, blood in stool and abdominal distention.  Genitourinary: Denies dysuria, urgency, frequency, hematuria, flank pain and difficulty urinating.  Endocrine: Denies: hot or cold intolerance, sweats, changes in hair or nails, polyuria, polydipsia. Musculoskeletal: Denies myalgias, back pain. Skin: Denies pallor, rash and wound.  Neurological: Denies dizziness, seizures, syncope, weakness, light-headedness, numbness and headaches.  Hematological: Denies adenopathy. Easy bruising, personal or family bleeding history  Psychiatric/Behavioral: Denies suicidal ideation, mood changes, confusion, nervousness, sleep disturbance and agitation    Physical Exam: Vitals:   05/25/20 0752  BP: 110/84  Pulse: 89  Temp: 98.8 F (37.1 C)  TempSrc: Temporal  SpO2: 97%  Weight: 242 lb 6.4 oz (110 kg)    Body mass index is 33.81 kg/m.   Constitutional: NAD, calm, comfortable Eyes: PERRL, lids and conjunctivae normal ENMT: Mucous membranes are moist.  Respiratory: clear to auscultation bilaterally, no wheezing, no crackles. Normal respiratory effort. No accessory muscle use.  Cardiovascular: Regular rate and rhythm, no murmurs / rubs / gallops.  2+ lower extremity edema. Neurologic: Grossly intact and nonfocal Psychiatric: Normal judgment and insight. Alert and oriented x 3. Normal mood.    Impression and Plan:  Diet-controlled type 2 diabetes mellitus ([place])  -He has been started on low-dose Metformin, his most recent A1c was 6.7 in February.  Essential hypertension, benign  -Well-controlled on current regimen.  Hyperlipidemia, unspecified hyperlipidemia type  -He has decided to give atorvastatin another try. -He will contact us if he continues to have issues, if he does I  think going to the lipid clinic would be beneficial.  Tobacco use disorder -He quit as of 4 years ago.  Peripheral arterial disease ([place]) -He will contact Dr. Alvester Chou again for follow-up.   Patient Instructions  -Nice seeing you today!!  -Lab work today; will notify you once results are available.  -Make sure you touch base with Dr. Gwenlyn Found about the circulation in your legs.  -Start lipitor 80 mg at bedtime for your cholesterol.  -Start metformin 500 mg in the morning for your diabetes.  -Schedule follow up in 3 months.  -Make sure you call the GI office back for your colonoscopy.     Lelon Frohlich, MD [facility]

## 2020-05-26 ENCOUNTER — Other Ambulatory Visit: Payer: Self-pay | Admitting: Internal Medicine

## 2020-05-26 DIAGNOSIS — E559 Vitamin D deficiency, unspecified: Secondary | ICD-10-CM

## 2020-05-26 LAB — LIPID PANEL
Cholesterol: 387 mg/dL — ABNORMAL HIGH (ref <200)
HDL: 92 mg/dL (ref >=40)
LDL Cholesterol (Calc): 278 mg/dL (calc) — ABNORMAL HIGH
Non-HDL Cholesterol (Calc): 295 mg/dL (calc) — ABNORMAL HIGH (ref <130)
Total CHOL/HDL Ratio: 4.2 (calc) (ref <5.0)
Triglycerides: 61 mg/dL (ref <150)

## 2020-05-26 LAB — COMPREHENSIVE METABOLIC PANEL
AG Ratio: 1 (calc) (ref 1.0–2.5)
ALT: 11 U/L (ref 9–46)
AST: 15 U/L (ref 10–35)
Albumin: 2.3 g/dL — ABNORMAL LOW (ref 3.6–5.1)
Alkaline phosphatase (APISO): 73 U/L (ref 35–144)
BUN/Creatinine Ratio: 24 (calc) — ABNORMAL HIGH (ref 6–22)
BUN: 30 mg/dL — ABNORMAL HIGH (ref 7–25)
CO2: 33 mmol/L — ABNORMAL HIGH (ref 20–32)
Calcium: 8.1 mg/dL — ABNORMAL LOW (ref 8.6–10.3)
Chloride: 103 mmol/L (ref 98–110)
Creat: 1.27 mg/dL — ABNORMAL HIGH (ref 0.70–1.25)
Globulin: 2.2 g/dL (calc) (ref 1.9–3.7)
Glucose, Bld: 108 mg/dL — ABNORMAL HIGH (ref 65–99)
Potassium: 3.6 mmol/L (ref 3.5–5.3)
Sodium: 144 mmol/L (ref 135–146)
Total Bilirubin: 0.4 mg/dL (ref 0.2–1.2)
Total Protein: 4.5 g/dL — ABNORMAL LOW (ref 6.1–8.1)

## 2020-05-26 LAB — CBC WITH DIFFERENTIAL/PLATELET
Absolute Monocytes: 414 cells/uL (ref 200–950)
Basophils Absolute: 18 cells/uL (ref 0–200)
Basophils Relative: 0.4 %
Eosinophils Absolute: 423 cells/uL (ref 15–500)
Eosinophils Relative: 9.2 %
HCT: 43.8 % (ref 38.5–50.0)
Hemoglobin: 14.1 g/dL (ref 13.2–17.1)
Lymphs Abs: 1734 cells/uL (ref 850–3900)
MCH: 29.9 pg (ref 27.0–33.0)
MCHC: 32.2 g/dL (ref 32.0–36.0)
MCV: 93 fL (ref 80.0–100.0)
MPV: 10.8 fL (ref 7.5–12.5)
Monocytes Relative: 9 %
Neutro Abs: 2010 cells/uL (ref 1500–7800)
Neutrophils Relative %: 43.7 %
Platelets: 237 10*3/uL (ref 140–400)
RBC: 4.71 10*6/uL (ref 4.20–5.80)
RDW: 13.1 % (ref 11.0–15.0)
Total Lymphocyte: 37.7 %
WBC: 4.6 10*3/uL (ref 3.8–10.8)

## 2020-05-26 LAB — VITAMIN D 25 HYDROXY (VIT D DEFICIENCY, FRACTURES): Vit D, 25-Hydroxy: 24 ng/mL — ABNORMAL LOW (ref 30–100)

## 2020-05-26 LAB — TSH: TSH: 1.52 mIU/L (ref 0.40–4.50)

## 2020-05-26 LAB — VITAMIN B12: Vitamin B-12: 259 pg/mL (ref 200–1100)

## 2020-05-26 MED ORDER — VITAMIN D (ERGOCALCIFEROL) 1.25 MG (50000 UNIT) PO CAPS: 50000.0000 [IU] | ORAL_CAPSULE | ORAL | 0 refills | Status: AC

## 2020-06-09 ENCOUNTER — Telehealth: Payer: Self-pay | Admitting: Cardiovascular Disease

## 2020-06-09 NOTE — Telephone Encounter (Signed)
FMLA paperwork was placed in Dr.Berry mailbox on 06/09/2020.

## 2020-06-11 ENCOUNTER — Encounter: Payer: Self-pay | Admitting: Internal Medicine

## 2020-06-14 ENCOUNTER — Other Ambulatory Visit: Payer: Self-pay

## 2020-06-14 ENCOUNTER — Ambulatory Visit: Payer: BC Managed Care – PPO | Admitting: Cardiovascular Disease

## 2020-06-14 ENCOUNTER — Encounter: Payer: Self-pay | Admitting: Cardiovascular Disease

## 2020-06-14 VITALS — BP 128/78 | HR 97 | Ht 71.0 in | Wt 243.0 lb

## 2020-06-14 DIAGNOSIS — I739 Peripheral vascular disease, unspecified: Secondary | ICD-10-CM | POA: Diagnosis not present

## 2020-06-14 DIAGNOSIS — Z01812 Encounter for preprocedural laboratory examination: Secondary | ICD-10-CM | POA: Diagnosis not present

## 2020-06-14 NOTE — Progress Notes (Signed)
06/14/2020 Samuel Butler   [birth year]  [id number]  Primary Physician Isaac Bliss, Rayford Halsted, MD Primary Cardiologist: Lorretta Harp MD [name], [place]  HPI:  Samuel Butler is a 59 y.o.  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  He was referred by Dr. Debara Pickett for peripheral vascular valuation because of lifestyle limiting claudication.  I last saw him in the office 03/08/2020. History factors include just continued tobacco use for years ago when he was smoking 2 packs a week for the last 40 years, treated hypertension and hyperlipidemia.  Is not diabetic.  His mother did die of a myocardial infarction in her 51s.  He was referred to Dr. Debara Pickett because of peripheral edema and hyper lipidemia.  A work-up is in progress.  He was prescribed a statin which she could not tolerate.  He has had lifestyle limiting claudication for last 6 months.  Dopplers performed 02/28/2020 revealed a right ABI of 0.76 and a left of 0.87 with a high-frequency signal in his mid right SFA and left tibial vessel disease.  Since I saw him 4 months ago he has had progressive claudication on the right side and wishes to proceed with angiography and endovascular therapy.   Current Meds  Medication Sig  . amLODipine (NORVASC) 5 MG tablet Take 1 tablet (5 mg total) by mouth daily.  Marland Kitchen atorvastatin (LIPITOR) 80 MG tablet Take 1 tablet (80 mg total) by mouth daily.  . carvedilol (COREG) 6.25 MG tablet Take 1 tablet (6.25 mg total) by mouth 2 (two) times daily with a meal.  . clotrimazole (LOTRIMIN) 1 % cream Apply to affected area 2 times daily  . hydrochlorothiazide (HYDRODIURIL) 25 MG tablet Take 1 tablet by mouth once daily  . losartan-hydrochlorothiazide (HYZAAR) 100-25 MG tablet Take 1 tablet by mouth daily.  . metFORMIN (GLUCOPHAGE) 500 MG tablet Take 1 tablet (500 mg total) by mouth daily with breakfast.  . predniSONE (DELTASONE)  20 MG tablet Take by mouth.  . sildenafil (VIAGRA) 50 MG tablet Take 1 tablet (50 mg total) by mouth daily as needed for erectile dysfunction. Do not take more then 1 dose in 24 hour period.  . triamcinolone cream (KENALOG) 0.1 % Apply 1 application topically 2 (two) times daily. To your hands.  . Vitamin D, Ergocalciferol, (DRISDOL) 1.25 MG (50000 UNIT) CAPS capsule Take 1 capsule (50,000 Units total) by mouth every 7 (seven) days for 12 doses.     Allergies  Allergen Reactions  . Crestor [Rosuvastatin Calcium] Other (See Comments)    Joint aches    Social History   Socioeconomic History  . Marital status: Married    Spouse name: Not on file  . Number of children: Not on file  . Years of education: Not on file  . Highest education level: Not on file  Occupational History  . Not on file  Tobacco Use  . Smoking status: Former Smoker    Types: Cigarettes    Quit date: 10/23/2016    Years since quitting: 3.6  . Smokeless tobacco: Never Used  . Tobacco comment: per patient 5 cigarettes a day   Vaping Use  . Vaping Use: Never used  Substance and Sexual Activity  . Alcohol use: Yes    Comment: occasional  . Drug use: No  . Sexual activity: Not on file  Other Topics Concern  . 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 Difficulty of Paying Living Expenses:   Food Insecurity:   . Worried About Charity fundraiser in the Last Year:   . Arboriculturist in the Last Year:   Transportation Needs:   . Film/video editor (Medical):   Marland Kitchen Lack of Transportation (Non-Medical):   Physical Activity:   . Days of Exercise per Week:   . Minutes of Exercise per Session:   Stress:   . Feeling of Stress :   Social Connections:   .  Frequency of Communication with Friends and Family:   . Frequency of Social Gatherings with Friends and Family:   . Attends Religious Services:   . Active Member of Clubs or Organizations:   . Attends Archivist Meetings:   Marland Kitchen Marital Status:   Intimate Partner Violence:   . Fear of Current or Ex-Partner:   . Emotionally Abused:   Marland Kitchen Physically Abused:   . Sexually Abused:      Review of Systems: General: negative for chills, fever, night sweats or weight changes.  Cardiovascular: negative for chest pain, dyspnea on exertion, edema, orthopnea, palpitations, paroxysmal nocturnal dyspnea or shortness of breath Dermatological: negative for rash Respiratory: negative for cough or wheezing Urologic: negative for hematuria Abdominal: negative for nausea, vomiting, diarrhea, bright red blood per rectum, melena, or hematemesis Neurologic: negative for visual changes, syncope, or dizziness All other systems reviewed and are otherwise negative except as noted above.    Blood pressure 128/78, pulse 97, height 5\' 11"  (1.803 m), weight 243 lb (110.2 kg), SpO2 97 %.  General appearance: alert and no distress Neck: no adenopathy, no carotid bruit, no JVD, supple, symmetrical, trachea midline and thyroid not enlarged, symmetric, no tenderness/mass/nodules Lungs: clear to auscultation bilaterally Heart: regular rate and rhythm, S1, S2 normal, no murmur, click, rub or gallop Extremities: extremities normal, atraumatic, no cyanosis or edema Pulses: 2+ and symmetric Skin: Skin color, texture, turgor normal. No rashes or lesions Neurologic: Alert and oriented X 3, normal strength and tone. Normal symmetric reflexes. Normal coordination and gait  EKG not performed today  ASSESSMENT AND PLAN:   Peripheral arterial disease [facility]) Mr. Engelmann returns today for follow-up.  I saw him 4 months ago as a referral for Dr. Debara Pickett.  She has right greater than left lower extremity claudication with Dopplers  performed 02/28/2020 revealing a right ABI of 0.76 and the left is 0.87 with a high frequency signal in his right SFA.  He wishes to proceed with angiography and potential endovascular therapy for lifestyle limiting claudication.      Lorretta Harp MD FACP,FACC,FAHA, [facility] 06/14/2020 4:38 PM

## 2020-06-14 NOTE — H&P (View-Only) (Signed)
06/14/2020 Samuel [facility]   [birth year]  [id number]  Primary Physician Isaac Bliss, Rayford Halsted, MD Primary Cardiologist: Lorretta Harp MD [name], [place]  HPI:  Samuel Butler is a 59 y.o.  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  He was referred by Dr. Debara Pickett for peripheral vascular valuation because of lifestyle limiting claudication.  I last saw him in the office 03/08/2020. History factors include just continued tobacco use for years ago when he was smoking 2 packs a week for the last 40 years, treated hypertension and hyperlipidemia.  Is not diabetic.  His mother did die of a myocardial infarction in her 37s.  He was referred to Dr. Debara Pickett because of peripheral edema and hyper lipidemia.  A work-up is in progress.  He was prescribed a statin which she could not tolerate.  He has had lifestyle limiting claudication for last 6 months.  Dopplers performed 02/28/2020 revealed a right ABI of 0.76 and a left of 0.87 with a high-frequency signal in his mid right SFA and left tibial vessel disease.  Since I saw him 4 months ago he has had progressive claudication on the right side and wishes to proceed with angiography and endovascular therapy.   Current Meds  Medication Sig  . amLODipine (NORVASC) 5 MG tablet Take 1 tablet (5 mg total) by mouth daily.  Marland Kitchen atorvastatin (LIPITOR) 80 MG tablet Take 1 tablet (80 mg total) by mouth daily.  . carvedilol (COREG) 6.25 MG tablet Take 1 tablet (6.25 mg total) by mouth 2 (two) times daily with a meal.  . clotrimazole (LOTRIMIN) 1 % cream Apply to affected area 2 times daily  . hydrochlorothiazide (HYDRODIURIL) 25 MG tablet Take 1 tablet by mouth once daily  . losartan-hydrochlorothiazide (HYZAAR) 100-25 MG tablet Take 1 tablet by mouth daily.  . metFORMIN (GLUCOPHAGE) 500 MG tablet Take 1 tablet (500 mg total) by mouth daily with breakfast.  . predniSONE (DELTASONE)  20 MG tablet Take by mouth.  . sildenafil (VIAGRA) 50 MG tablet Take 1 tablet (50 mg total) by mouth daily as needed for erectile dysfunction. Do not take more then 1 dose in 24 hour period.  . triamcinolone cream (KENALOG) 0.1 % Apply 1 application topically 2 (two) times daily. To your hands.  . Vitamin D, Ergocalciferol, (DRISDOL) 1.25 MG (50000 UNIT) CAPS capsule Take 1 capsule (50,000 Units total) by mouth every 7 (seven) days for 12 doses.     Allergies  Allergen Reactions  . Crestor [Rosuvastatin Calcium] Other (See Comments)    Joint aches    Social History   Socioeconomic History  . Marital status: Married    Spouse name: Not on file  . Number of children: Not on file  . Years of education: Not on file  . Highest education level: Not on file  Occupational History  . Not on file  Tobacco Use  . Smoking status: Former Smoker    Types: Cigarettes    Quit date: 10/23/2016    Years since quitting: 3.6  . Smokeless tobacco: Never Used  . Tobacco comment: per patient 5 cigarettes a day   Vaping Use  . Vaping Use: Never used  Substance and Sexual Activity  . Alcohol use: Yes    Comment: occasional  . Drug use: No  . Sexual activity: Not on file  Other Topics Concern  . 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 Difficulty of Paying Living Expenses:   Food Insecurity:   . Worried About Charity fundraiser in the Last Year:   . Arboriculturist in the Last Year:   Transportation Needs:   . Film/video editor (Medical):   Marland Kitchen Lack of Transportation (Non-Medical):   Physical Activity:   . Days of Exercise per Week:   . Minutes of Exercise per Session:   Stress:   . Feeling of Stress :   Social Connections:   .  Frequency of Communication with Friends and Family:   . Frequency of Social Gatherings with Friends and Family:   . Attends Religious Services:   . Active Member of Clubs or Organizations:   . Attends Archivist Meetings:   Marland Kitchen Marital Status:   Intimate Partner Violence:   . Fear of Current or Ex-Partner:   . Emotionally Abused:   Marland Kitchen Physically Abused:   . Sexually Abused:      Review of Systems: General: negative for chills, fever, night sweats or weight changes.  Cardiovascular: negative for chest pain, dyspnea on exertion, edema, orthopnea, palpitations, paroxysmal nocturnal dyspnea or shortness of breath Dermatological: negative for rash Respiratory: negative for cough or wheezing Urologic: negative for hematuria Abdominal: negative for nausea, vomiting, diarrhea, bright red blood per rectum, melena, or hematemesis Neurologic: negative for visual changes, syncope, or dizziness All other systems reviewed and are otherwise negative except as noted above.    Blood pressure 128/78, pulse 97, height 5\' 11"  (1.803 m), weight 243 lb (110.2 kg), SpO2 97 %.  General appearance: alert and no distress Neck: no adenopathy, no carotid bruit, no JVD, supple, symmetrical, trachea midline and thyroid not enlarged, symmetric, no tenderness/mass/nodules Lungs: clear to auscultation bilaterally Heart: regular rate and rhythm, S1, S2 normal, no murmur, click, rub or gallop Extremities: extremities normal, atraumatic, no cyanosis or edema Pulses: 2+ and symmetric Skin: Skin color, texture, turgor normal. No rashes or lesions Neurologic: Alert and oriented X 3, normal strength and tone. Normal symmetric reflexes. Normal coordination and gait  EKG not performed today  ASSESSMENT AND PLAN:   Peripheral arterial disease [facility]) Mr. Wollman returns today for follow-up.  I saw him 4 months ago as a referral for Dr. Debara Pickett.  She has right greater than left lower extremity claudication with Dopplers  performed 02/28/2020 revealing a right ABI of 0.76 and the left is 0.87 with a high frequency signal in his right SFA.  He wishes to proceed with angiography and potential endovascular therapy for lifestyle limiting claudication.      Lorretta Harp MD FACP,FACC,FAHA, [facility] 06/14/2020 4:38 PM

## 2020-06-14 NOTE — Patient Instructions (Signed)
Medication Instructions:  START aspirin 81mg  daily CONTINUE all other current medications   *If you need a refill on your cardiac medications before your next appointment, please call your pharmacy*   Lab Work: BMET & CBC prior to PV angiogram @ Dr. Kennon Holter office  You will need to have the coronavirus test completed prior to your procedure. An appointment has been made at 2:25pm on 06/23/20. This is a Drive Up Visit at the [address] [place]. Please tell them that you are there for pre-procedure testing. Someone will direct you to the appropriate testing line. Stay in your car and someone will be with you shortly. Please make sure to have all other labs completed before this test because you will need to stay quarantined until your procedure. Please take your insurance card to this test.   If you have labs (blood work) drawn today and your tests are completely normal, you will receive your results only by: Marland Kitchen MyChart Message (if you have MyChart) OR . A paper copy in the mail If you have any lab test that is abnormal or we need to change your treatment, we will call you to review the results.   Testing/Procedures: PV Angiogram at [place] Extremity Arterial Doppler 1 week post-procedure   Follow-Up: At [facility], you and your health needs are our priority.  As part of our continuing mission to provide you with exceptional heart care, we have created designated Provider Care Teams.  These Care Teams include your primary Cardiologist (physician) and Advanced Practice Providers (APPs -  Physician Assistants and Nurse Practitioners) who all work together to provide you with the care you need, when you need it.  We recommend signing up for the patient portal called "MyChart".  Sign up information is provided on this After Visit Summary.  MyChart is used to connect with patients for Virtual Visits (Telemedicine).  Patients are able to view lab/test results,  encounter notes, upcoming appointments, etc.  Non-urgent messages can be sent to your provider as well.   To learn more about what you can do with MyChart, go to [email or web address].    Your next appointment:   2 week(s)  The format for your next appointment:   In Person  Provider:   Quay Burow, MD   Other Instructions     [place] [address] Dept: [phone] Loc: [phone]  Samuel Butler  06/14/2020  You are scheduled for a Peripheral Angiogram on Monday, August 16 with Dr. Quay Butler.  1. Please arrive at the [facility] (Main Entrance A) at [facility]: [address] [place], [place] [address] at 9:30 AM (This time is two hours before your procedure to ensure your preparation). Free valet parking service is available.   Special note: Every effort is made to have your procedure done on time. Please understand that emergencies sometimes delay scheduled procedures.  2. Diet: Do not eat solid foods after midnight.  The patient may have clear liquids until 5am upon the day of the procedure.  3. Labs: You will need to have blood drawn on Friday 06/23/2020 @ Dr. Kennon Holter office. You do not need to be fasting.  4. Medication instructions in preparation for your procedure:  HOLD ALL diabetes medications the morning of procedure and HOLD metformin 48 hours post-procedure    On the morning of your procedure, take your Aspirin and any  morning medicines NOT listed above.  You may use sips of water.  5. Plan for one night stay--bring personal belongings. 6. Bring a current list of your medications and current insurance cards. 7. You MUST have a responsible person to drive you home. 8. Someone MUST be with you the first 24 hours after you arrive home or your discharge will be delayed. 9. Please wear clothes that are easy to get on and off and  wear slip-on shoes.  Thank you for allowing [place] to care for you!   -- [facility]

## 2020-06-14 NOTE — Assessment & Plan Note (Signed)
[name] returns today for follow-up.  I saw him 4 months ago as a referral for Dr. Debara Pickett.  She has right greater than left lower extremity claudication with Dopplers performed 02/28/2020 revealing a right ABI of 0.76 and the left is 0.87 with a high frequency signal in his right SFA.  He wishes to proceed with angiography and potential endovascular therapy for lifestyle limiting claudication.

## 2020-06-15 ENCOUNTER — Telehealth: Payer: Self-pay | Admitting: Cardiovascular Disease

## 2020-06-15 NOTE — Telephone Encounter (Signed)
Left message for patient to call and scheduled 1 week post angiogram (procedure scheduled 06/26/20)e follow up doppler studies and 2 week follow up with Dr. Gwenlyn Found per 06/14/20 staff message

## 2020-06-16 ENCOUNTER — Other Ambulatory Visit: Payer: Self-pay | Admitting: Cardiovascular Disease

## 2020-06-16 DIAGNOSIS — I739 Peripheral vascular disease, unspecified: Secondary | ICD-10-CM

## 2020-06-19 NOTE — Telephone Encounter (Signed)
Pt FMLA placed back in Dr. Gwenlyn Found box to be reviewed AFTER 8/16 Angiogram procedure to complete documentation.

## 2020-06-22 ENCOUNTER — Telehealth: Payer: Self-pay | Admitting: *Deleted

## 2020-06-22 NOTE — Telephone Encounter (Signed)
Pt contacted pre-abdominal aortogram scheduled at [facility] for: Monday June 26, 2020 11:30 AM Verified arrival time and place: [facility]) at: 9;30 AM   No solid food after midnight prior to cath, clear liquids until 5 AM day of procedure.  Hold: Metformin-day of procedure and 48 hours post procedure Losartan-HCTZ-AM of procedure  Except hold medications AM meds can be  taken pre-cath with sips of water including: ASA 81 mg   Confirmed patient has responsible adult to drive home post procedure and observe 24 hours after arriving home: yes  You are allowed ONE visitor in the waiting room during your procedure. Both you and your visitor must wear a mask once you enter the hospital.      COVID-19 Pre-Screening Questions:   In the past 14 days have you had a new cough associated with shortness of breath, unexplained body aches, fever (100.4 or greater) or sudden loss of taste or sense of smell? no  In the past 14 days have you been around anyone with known Covid 19? no  Have you been vaccinated for COVID-19? Yes, see immunization history   Reviewed procedure/mask/visitor instructions, COVID-19 questions with patient.

## 2020-06-22 NOTE — Telephone Encounter (Signed)
Pt reports he had gone to [facility] [address] 06/12/20 for swelling in his hand, he was given Prednisone, had discussed this with Samuel Butler at office visit 06/14/20. Pt reports swelling in his hand has resolved. Pt reports he had follow up appt today with [facility].  Pt reports he did not have blood tests today but did have urinalysis. Pt reports he was told had protein in his urine and Samuel Butler wanted [facility] to have this information before abdominal aortogram on Monday. Pt reports Samuel Butler was going to fax his report from today to Samuel [facility] at the [facility].  Pt advised to have pre-procedure BMP/CBC/COVID-19 tomorrow as previously instructed.  Pt advised I will forward this information to Samuel Butler for his review.

## 2020-06-23 ENCOUNTER — Telehealth: Payer: Self-pay | Admitting: Cardiovascular Disease

## 2020-06-23 ENCOUNTER — Other Ambulatory Visit (HOSPITAL_COMMUNITY)
Admission: RE | Admit: 2020-06-23 | Discharge: 2020-06-23 | Disposition: A | Discharge status: Home / Self Care | Payer: BC Managed Care – PPO | Source: Ambulatory Visit | Attending: Cardiovascular Disease | Admitting: Cardiovascular Disease

## 2020-06-23 DIAGNOSIS — Z20822 Contact with and (suspected) exposure to covid-19: Secondary | ICD-10-CM | POA: Diagnosis not present

## 2020-06-23 DIAGNOSIS — Z01812 Encounter for preprocedural laboratory examination: Secondary | ICD-10-CM | POA: Diagnosis present

## 2020-06-23 LAB — BASIC METABOLIC PANEL
BUN/Creatinine Ratio: 25 — ABNORMAL HIGH (ref 10–24)
BUN: 29 mg/dL — ABNORMAL HIGH (ref 8–27)
CO2: 30 mmol/L — ABNORMAL HIGH (ref 20–29)
Calcium: 8.3 mg/dL — ABNORMAL LOW (ref 8.6–10.2)
Chloride: 104 mmol/L (ref 96–106)
Creatinine, Ser: 1.14 mg/dL (ref 0.76–1.27)
GFR calc Af Amer: 80 mL/min/{1.73_m2} (ref >59)
GFR calc non Af Amer: 70 mL/min/{1.73_m2} (ref >59)
Glucose: 131 mg/dL — ABNORMAL HIGH (ref 65–99)
Potassium: 3.6 mmol/L (ref 3.5–5.2)
Sodium: 145 mmol/L — ABNORMAL HIGH (ref 134–144)

## 2020-06-23 LAB — CBC
Hematocrit: 43.7 % (ref 37.5–51.0)
Hemoglobin: 14.2 g/dL (ref 13.0–17.7)
MCH: 29.3 pg (ref 26.6–33.0)
MCHC: 32.5 g/dL (ref 31.5–35.7)
MCV: 90 fL (ref 79–97)
Platelets: 351 10*3/uL (ref 150–450)
RBC: 4.85 x10E6/uL (ref 4.14–5.80)
RDW: 13.3 % (ref 11.6–15.4)
WBC: 14.3 10*3/uL — ABNORMAL HIGH (ref 3.4–10.8)

## 2020-06-23 LAB — SARS CORONAVIRUS 2 (TAT 6-24 HRS): SARS Coronavirus 2: NEGATIVE

## 2020-06-23 NOTE — Telephone Encounter (Signed)
Attending Physicians Statement Forms from [employer] received on 06/23/20 for Dr. Quay Burow to complete.  Gave paperwork to [place]  On 06/23/20  fsw

## 2020-06-26 ENCOUNTER — Other Ambulatory Visit: Payer: Self-pay

## 2020-06-26 ENCOUNTER — Ambulatory Visit (HOSPITAL_COMMUNITY)
Admission: RE | Admit: 2020-06-26 | Discharge: 2020-06-27 | Disposition: A | Discharge status: Home / Self Care | Payer: BC Managed Care – PPO | Attending: Cardiovascular Disease | Admitting: Cardiovascular Disease

## 2020-06-26 ENCOUNTER — Encounter (HOSPITAL_COMMUNITY)
Admission: RE | Disposition: A | Discharge status: Home / Self Care | Payer: Self-pay | Source: Home / Self Care | Attending: Cardiovascular Disease

## 2020-06-26 DIAGNOSIS — Z888 Allergy status to other drugs, medicaments and biological substances status: Secondary | ICD-10-CM | POA: Insufficient documentation

## 2020-06-26 DIAGNOSIS — Z7984 Long term (current) use of oral hypoglycemic drugs: Secondary | ICD-10-CM | POA: Insufficient documentation

## 2020-06-26 DIAGNOSIS — Z7952 Long term (current) use of systemic steroids: Secondary | ICD-10-CM | POA: Insufficient documentation

## 2020-06-26 DIAGNOSIS — I10 Essential (primary) hypertension: Secondary | ICD-10-CM | POA: Diagnosis not present

## 2020-06-26 DIAGNOSIS — I70211 Atherosclerosis of native arteries of extremities with intermittent claudication, right leg: Secondary | ICD-10-CM | POA: Insufficient documentation

## 2020-06-26 DIAGNOSIS — Z79899 Other long term (current) drug therapy: Secondary | ICD-10-CM | POA: Insufficient documentation

## 2020-06-26 DIAGNOSIS — I739 Peripheral vascular disease, unspecified: Secondary | ICD-10-CM | POA: Diagnosis present

## 2020-06-26 DIAGNOSIS — E785 Hyperlipidemia, unspecified: Secondary | ICD-10-CM | POA: Diagnosis not present

## 2020-06-26 DIAGNOSIS — E876 Hypokalemia: Secondary | ICD-10-CM

## 2020-06-26 DIAGNOSIS — Z8249 Family history of ischemic heart disease and other diseases of the circulatory system: Secondary | ICD-10-CM | POA: Diagnosis not present

## 2020-06-26 DIAGNOSIS — Z87891 Personal history of nicotine dependence: Secondary | ICD-10-CM | POA: Diagnosis not present

## 2020-06-26 HISTORY — PX: PERIPHERAL VASCULAR INTERVENTION: CATH118257

## 2020-06-26 HISTORY — PX: ABDOMINAL AORTOGRAM W/LOWER EXTREMITY: CATH118223

## 2020-06-26 LAB — POCT ACTIVATED CLOTTING TIME
Activated Clotting Time: 197 seconds
Activated Clotting Time: 235 seconds
Activated Clotting Time: 246 s
Activated Clotting Time: 213 s
Activated Clotting Time: 164 s

## 2020-06-26 LAB — GLUCOSE, CAPILLARY
Glucose-Capillary: 92 mg/dL (ref 70–99)
Glucose-Capillary: 73 mg/dL (ref 70–99)
Glucose-Capillary: 114 mg/dL — ABNORMAL HIGH (ref 70–99)
Glucose-Capillary: 186 mg/dL — ABNORMAL HIGH (ref 70–99)
Glucose-Capillary: 153 mg/dL — ABNORMAL HIGH (ref 70–99)

## 2020-06-26 SURGERY — ABDOMINAL AORTOGRAM W/LOWER EXTREMITY
Anesthesia: LOCAL | Laterality: Right

## 2020-06-26 MED ORDER — FENTANYL CITRATE (PF) 100 MCG/2ML IJ SOLN
INTRAMUSCULAR | Status: DC | PRN
  Administered 2020-06-26: 25 ug via INTRAVENOUS

## 2020-06-26 MED ORDER — CLOPIDOGREL BISULFATE 300 MG PO TABS
ORAL_TABLET | ORAL | Status: AC
  Filled 2020-06-26: qty 1

## 2020-06-26 MED ORDER — ACETAMINOPHEN 325 MG PO TABS: 650.0000 mg | ORAL_TABLET | ORAL | Status: DC | PRN

## 2020-06-26 MED ORDER — HEPARIN (PORCINE) IN NACL 1000-0.9 UT/500ML-% IV SOLN
INTRAVENOUS | Status: AC
  Filled 2020-06-26: qty 1000

## 2020-06-26 MED ORDER — LOSARTAN POTASSIUM-HCTZ 100-25 MG PO TABS: 1.0000 | ORAL_TABLET | Freq: Every day | ORAL | Status: DC

## 2020-06-26 MED ORDER — LOSARTAN POTASSIUM 50 MG PO TABS
100.0000 mg | ORAL_TABLET | Freq: Every day | ORAL | Status: DC
  Administered 2020-06-26 – 2020-06-27 (×2): 100 mg via ORAL
  Filled 2020-06-26 (×2): qty 2

## 2020-06-26 MED ORDER — HYDRALAZINE HCL 20 MG/ML IJ SOLN
INTRAMUSCULAR | Status: DC | PRN
  Administered 2020-06-26: 10 mg via INTRAVENOUS

## 2020-06-26 MED ORDER — CLOPIDOGREL BISULFATE 75 MG PO TABS
75.0000 mg | ORAL_TABLET | Freq: Every day | ORAL | Status: DC
  Administered 2020-06-27: 75 mg via ORAL
  Filled 2020-06-26: qty 1

## 2020-06-26 MED ORDER — ATORVASTATIN CALCIUM 80 MG PO TABS
80.0000 mg | ORAL_TABLET | Freq: Every day | ORAL | Status: DC
  Administered 2020-06-26: 80 mg via ORAL
  Filled 2020-06-26: qty 1

## 2020-06-26 MED ORDER — SODIUM CHLORIDE 0.9 % WEIGHT BASED INFUSION
3.0000 mL/kg/h | INTRAVENOUS | Status: DC
  Administered 2020-06-26: 3 mL/kg/h via INTRAVENOUS

## 2020-06-26 MED ORDER — HEPARIN SODIUM (PORCINE) 1000 UNIT/ML IJ SOLN
INTRAMUSCULAR | Status: DC | PRN
  Administered 2020-06-26: 5000 [IU] via INTRAVENOUS
  Administered 2020-06-26: 3000 [IU] via INTRAVENOUS
  Administered 2020-06-26: 11000 [IU] via INTRAVENOUS

## 2020-06-26 MED ORDER — ASPIRIN EC 81 MG PO TBEC
81.0000 mg | DELAYED_RELEASE_TABLET | Freq: Every day | ORAL | Status: DC
  Administered 2020-06-27: 81 mg via ORAL
  Filled 2020-06-26: qty 1

## 2020-06-26 MED ORDER — MORPHINE SULFATE (PF) 2 MG/ML IV SOLN
INTRAVENOUS | Status: AC
  Filled 2020-06-26: qty 1

## 2020-06-26 MED ORDER — AMLODIPINE BESYLATE 5 MG PO TABS
5.0000 mg | ORAL_TABLET | Freq: Every day | ORAL | Status: DC
  Administered 2020-06-26 – 2020-06-27 (×2): 5 mg via ORAL
  Filled 2020-06-26 (×2): qty 1

## 2020-06-26 MED ORDER — HYDROCHLOROTHIAZIDE 25 MG PO TABS: 25.0000 mg | ORAL_TABLET | Freq: Every day | ORAL | Status: DC

## 2020-06-26 MED ORDER — SODIUM CHLORIDE 0.9% FLUSH: 3.0000 mL | INTRAVENOUS | Status: DC | PRN

## 2020-06-26 MED ORDER — LIDOCAINE HCL (PF) 1 % IJ SOLN
INTRAMUSCULAR | Status: AC
  Filled 2020-06-26: qty 30

## 2020-06-26 MED ORDER — HYDRALAZINE HCL 20 MG/ML IJ SOLN: 5.0000 mg | INTRAMUSCULAR | Status: DC | PRN

## 2020-06-26 MED ORDER — IODIXANOL 320 MG/ML IV SOLN
INTRAVENOUS | Status: DC | PRN
  Administered 2020-06-26: 150 mL via INTRA_ARTERIAL

## 2020-06-26 MED ORDER — SODIUM CHLORIDE 0.9 % IV SOLN: 250.0000 mL | INTRAVENOUS | Status: DC | PRN

## 2020-06-26 MED ORDER — MORPHINE SULFATE (PF) 2 MG/ML IV SOLN
2.0000 mg | INTRAVENOUS | Status: DC | PRN
  Administered 2020-06-26 – 2020-06-27 (×4): 2 mg via INTRAVENOUS
  Filled 2020-06-26 (×3): qty 1

## 2020-06-26 MED ORDER — LABETALOL HCL 5 MG/ML IV SOLN: 10.0000 mg | INTRAVENOUS | Status: DC | PRN

## 2020-06-26 MED ORDER — ONDANSETRON HCL 4 MG/2ML IJ SOLN: 4.0000 mg | Freq: Four times a day (QID) | INTRAMUSCULAR | Status: DC | PRN

## 2020-06-26 MED ORDER — HYDROCHLOROTHIAZIDE 25 MG PO TABS
25.0000 mg | ORAL_TABLET | Freq: Every day | ORAL | Status: DC
  Administered 2020-06-26 – 2020-06-27 (×2): 25 mg via ORAL
  Filled 2020-06-26 (×2): qty 1

## 2020-06-26 MED ORDER — LIDOCAINE HCL (PF) 1 % IJ SOLN
INTRAMUSCULAR | Status: DC | PRN
  Administered 2020-06-26 (×2): 20 mL via SUBCUTANEOUS

## 2020-06-26 MED ORDER — CARVEDILOL 6.25 MG PO TABS
6.2500 mg | ORAL_TABLET | Freq: Two times a day (BID) | ORAL | Status: DC
  Administered 2020-06-27: 6.25 mg via ORAL
  Filled 2020-06-26: qty 1

## 2020-06-26 MED ORDER — HEPARIN SODIUM (PORCINE) 1000 UNIT/ML IJ SOLN
INTRAMUSCULAR | Status: AC
  Filled 2020-06-26: qty 1

## 2020-06-26 MED ORDER — SODIUM CHLORIDE 0.9% FLUSH
3.0000 mL | Freq: Two times a day (BID) | INTRAVENOUS | Status: DC
  Administered 2020-06-26 – 2020-06-27 (×2): 3 mL via INTRAVENOUS

## 2020-06-26 MED ORDER — ASPIRIN 81 MG PO CHEW
CHEWABLE_TABLET | ORAL | Status: AC
  Filled 2020-06-26: qty 1

## 2020-06-26 MED ORDER — CLOPIDOGREL BISULFATE 300 MG PO TABS
ORAL_TABLET | ORAL | Status: DC | PRN
  Administered 2020-06-26: 300 mg via ORAL

## 2020-06-26 MED ORDER — FENTANYL CITRATE (PF) 100 MCG/2ML IJ SOLN
INTRAMUSCULAR | Status: AC
  Filled 2020-06-26: qty 2

## 2020-06-26 MED ORDER — MIDAZOLAM HCL 2 MG/2ML IJ SOLN
INTRAMUSCULAR | Status: DC | PRN
  Administered 2020-06-26: 1 mg via INTRAVENOUS

## 2020-06-26 MED ORDER — SODIUM CHLORIDE 0.9 % IV SOLN: INTRAVENOUS | Status: AC

## 2020-06-26 MED ORDER — ASPIRIN 81 MG PO CHEW
81.0000 mg | CHEWABLE_TABLET | ORAL | Status: AC
  Administered 2020-06-26: 81 mg via ORAL

## 2020-06-26 MED ORDER — SODIUM CHLORIDE 0.9 % WEIGHT BASED INFUSION
1.0000 mL/kg/h | INTRAVENOUS | Status: DC
  Administered 2020-06-26: 1 mL/kg/h via INTRAVENOUS

## 2020-06-26 MED ORDER — HEPARIN (PORCINE) IN NACL 1000-0.9 UT/500ML-% IV SOLN
INTRAVENOUS | Status: DC | PRN
  Administered 2020-06-26 (×3): 500 mL

## 2020-06-26 MED ORDER — MIDAZOLAM HCL 2 MG/2ML IJ SOLN
INTRAMUSCULAR | Status: AC
  Filled 2020-06-26: qty 2

## 2020-06-26 SURGICAL SUPPLY — 32 items
BALLN IN.PACT DCB 6X60 (BALLOONS) ×6
BALLN JADE .035 6.0 X 40 (BALLOONS) ×3
BALLOON JADE .035 6.0 X 40 (BALLOONS) IMPLANT
CATH ANGIO 5F PIGTAIL 65CM (CATHETERS) ×1 IMPLANT
CATH CROSS OVER TEMPO 5F (CATHETERS) ×1 IMPLANT
CATH NAVICROSS ANG 65CM (CATHETERS) IMPLANT
CATH NAVICROSS ANGLED 90CM (MICROCATHETER) ×1 IMPLANT
CATH STRAIGHT 5FR 65CM (CATHETERS) ×1 IMPLANT
CATHETER NAVICROSS ANG 65CM (CATHETERS) ×3
DCB IN.PACT 6X60 (BALLOONS) IMPLANT
DEVICE CLOSURE PERCLS PRGLD 6F (VASCULAR PRODUCTS) IMPLANT
GUIDEWIRE ANGLED .035X150CM (WIRE) ×1 IMPLANT
KIT ENCORE 26 ADVANTAGE (KITS) ×1 IMPLANT
KIT MICROPUNCTURE NIT STIFF (SHEATH) ×1 IMPLANT
KIT PV (KITS) ×3 IMPLANT
PERCLOSE PROGLIDE 6F (VASCULAR PRODUCTS) ×3
SHEATH PINNACLE 5F 10CM (SHEATH) ×3 IMPLANT
SHEATH PINNACLE 6F 10CM (SHEATH) ×1 IMPLANT
SHEATH PINNACLE ST 6F 45CM (SHEATH) ×1 IMPLANT
SHEATH PROBE COVER 6X72 (BAG) ×1 IMPLANT
STENT ABSOLUTE PRO 6X60X135 (Permanent Stent) ×1 IMPLANT
STENT ABSOLUTE PRO 7X60X135 (Permanent Stent) ×1 IMPLANT
STOPCOCK MORSE 400PSI 3WAY (MISCELLANEOUS) ×1 IMPLANT
SYR MEDRAD MARK 7 150ML (SYRINGE) ×3 IMPLANT
TAPE VIPERTRACK RADIOPAQ (MISCELLANEOUS) IMPLANT
TAPE VIPERTRACK RADIOPAQUE (MISCELLANEOUS) ×3
TRANSDUCER W/STOPCOCK (MISCELLANEOUS) ×3 IMPLANT
TRAY PV CATH (CUSTOM PROCEDURE TRAY) ×3 IMPLANT
TUBING CIL FLEX 10 FLL-RA (TUBING) ×1 IMPLANT
WIRE HITORQ VERSACORE ST 145CM (WIRE) ×1 IMPLANT
WIRE ROSEN-J .035X180CM (WIRE) ×1 IMPLANT
WIRE VERSACORE LOC 115CM (WIRE) ×1 IMPLANT

## 2020-06-26 NOTE — Interval H&P Note (Signed)
History and Physical Interval Note:  06/26/2020 11:20 AM  Samuel Butler  has presented today for surgery, with the diagnosis of pad.  The various methods of treatment have been discussed with the patient and family. After consideration of risks, benefits and other options for treatment, the patient has consented to  Procedure(s): ABDOMINAL AORTOGRAM W/LOWER EXTREMITY (Right) as a surgical intervention.  The patient's history has been reviewed, patient examined, no change in status, stable for surgery.  I have reviewed the patient's chart and labs.  Questions were answered to the patient's satisfaction.     Quay Burow

## 2020-06-26 NOTE — Progress Notes (Signed)
Site area: rt groin fa sheath Site Prior to Removal:  Level 0 Pressure Applied For: 20 minutes Manual:   yes Patient Status During Pull:  stable Post Pull Site:  Level 0 Post Pull Instructions Given:  yes Post Pull Pulses Present: rt pt dopplered Dressing Applied:  Gauze and tegaderm Bedrest begins @ 1700 Comments:

## 2020-06-26 NOTE — Progress Notes (Signed)
C/O being very uncomfortable. Medicated for pain

## 2020-06-27 ENCOUNTER — Encounter (HOSPITAL_COMMUNITY): Payer: Self-pay | Admitting: Cardiovascular Disease

## 2020-06-27 DIAGNOSIS — E876 Hypokalemia: Secondary | ICD-10-CM

## 2020-06-27 DIAGNOSIS — I70211 Atherosclerosis of native arteries of extremities with intermittent claudication, right leg: Secondary | ICD-10-CM | POA: Diagnosis not present

## 2020-06-27 DIAGNOSIS — I739 Peripheral vascular disease, unspecified: Secondary | ICD-10-CM | POA: Diagnosis not present

## 2020-06-27 LAB — BASIC METABOLIC PANEL
Anion gap: 9 (ref 5–15)
BUN: 27 mg/dL — ABNORMAL HIGH (ref 6–20)
CO2: 29 mmol/L (ref 22–32)
Calcium: 7.6 mg/dL — ABNORMAL LOW (ref 8.9–10.3)
Chloride: 100 mmol/L (ref 98–111)
Creatinine, Ser: 1.08 mg/dL (ref 0.61–1.24)
GFR calc Af Amer: >60 mL/min (ref >60)
GFR calc non Af Amer: >60 mL/min (ref >60)
Glucose, Bld: 114 mg/dL — ABNORMAL HIGH (ref 70–99)
Potassium: 3.2 mmol/L — ABNORMAL LOW (ref 3.5–5.1)
Sodium: 138 mmol/L (ref 135–145)

## 2020-06-27 LAB — CBC
HCT: 39.3 % (ref 39.0–52.0)
Hemoglobin: 12.7 g/dL — ABNORMAL LOW (ref 13.0–17.0)
MCH: 29.5 pg (ref 26.0–34.0)
MCHC: 32.3 g/dL (ref 30.0–36.0)
MCV: 91.4 fL (ref 80.0–100.0)
Platelets: 194 10*3/uL (ref 150–400)
RBC: 4.3 MIL/uL (ref 4.22–5.81)
RDW: 15 % (ref 11.5–15.5)
WBC: 9.2 10*3/uL (ref 4.0–10.5)
nRBC: 0 % (ref 0.0–0.2)

## 2020-06-27 MED ORDER — ASPIRIN 81 MG PO TBEC: 81.0000 mg | DELAYED_RELEASE_TABLET | Freq: Every day | ORAL | 11 refills | Status: DC

## 2020-06-27 MED ORDER — CLOPIDOGREL BISULFATE 75 MG PO TABS: 75.0000 mg | ORAL_TABLET | Freq: Every day | ORAL | 3 refills | Status: DC

## 2020-06-27 MED ORDER — POTASSIUM CHLORIDE CRYS ER 20 MEQ PO TBCR
40.0000 meq | EXTENDED_RELEASE_TABLET | Freq: Once | ORAL | Status: AC
  Administered 2020-06-27: 40 meq via ORAL
  Filled 2020-06-27 (×2): qty 2

## 2020-06-27 NOTE — Progress Notes (Signed)
Pt is ambulated in the hall and did very well. He used a walker but reports that he does not need one at home. Pt denies any lightheadedness or dizziness. Vital signs are stable. Pt is ready for discharge.

## 2020-06-27 NOTE — Progress Notes (Signed)
Pt and his significant other received discharge instructions. Pt was educated about importance of taking Plavix daily and contact doctor immediately for any signs of bleeding or infection. Pt does not have any additional questions or concerns. Pt's vital signs are stable and he is ready for discharge.

## 2020-06-27 NOTE — Discharge Summary (Signed)
Discharge Summary    Patient ID: Samuel Butler MRN: [id number]; DOB: Jul 25, 1961  Admit date: 06/26/2020 Discharge date: 06/27/2020  Primary Care Provider: Isaac Bliss, Rayford Halsted, MD  Primary Cardiologist: Dr. Gwenlyn Found, MD-Peripheral Vascular   Discharge Diagnoses    Active Problems:   Peripheral arterial disease [facility])   Claudication in peripheral vascular disease [facility])  Diagnostic Studies/Procedures    Abdominal aortogram with lower extremity, peripheral vascular intervention 06/26/2020:  Angiographic Data:   1: Abdominal aorta-abdominal was free of significant atherosclerotic changes 2: Left lower extremity-tortuous iliac system.  60% mid, 90% distal left SFA, tandem 95 to 99% left popliteal artery stenoses with one-vessel runoff via the peroneal 3: Right lower extremity-80% proximal and mid right SFA stenosis with 99% calcified subtotaled right popliteal artery stenosis, 99% recanalized tibioperoneal trunk followed by a patent peroneal artery.  IMPRESSION: Mr. Spurgeon has bilateral SFA, popliteal and tibial vessel disease.  He has very tortuous iliacs with a narrow bifurcation making contralateral access technically challenging despite multiple attempts.  We will plan on proximal right SFA ultrasound-guided antegrade access followed by [facility] and nitinol stenting of the proximal and mid right SFA stenoses.  Final Impression: Successful proximal mid right SFA DCB/nitinol self-expanding stenting with significant popliteal and infrapopliteal disease in the setting of lifestyle limiting claudication.  This was done through an antegrade stick.  The patient did receive 300 mg of p.o. Plavix at the end of the case.  He will need to stay on DAPT for at least 12 months.  The sheath will be removed once ACT falls below 170 and pressure held.  He will be hydrated overnight, discharged home in the morning.  We will get lower extremity arterial Doppler studies in our [facility] line office next week and I  will see him back in follow-up 1 to 2 weeks thereafter.  History of Present Illness     Samuel Butler is a 59 y.o. male with history of peripheral vascular disease with lifestyle limiting claudication, ongoing tobacco use, hypertension and hyperlipidemia.  He was seen by Dr. Gwenlyn Found in the outpatient setting at which time he underwent lower extremity Doppler studies 02/28/2020 which revealed a right ABI of 0.76 and a left ABI of 0.87 with a high frequency signal in his mid right SFA and left tibial vessel disease.  On last follow-up, he had progressive claudication in the right side with plans to proceed with lower extremity angiography and endovascular therapy.  Hospital Course     Patient presented for abdominal aortogram with lower extremity peripheral vascular intervention secondary to lifestyle limiting claudication with confirmed abnormal ABI Doppler studies.  A successful proximal mid right SFA DCB/nitinol self-expanding stenting was performed secondary to significant popliteal and infrapopliteal disease in the setting of lifestyle limiting claudication. He will need to stay on DAPT for at least 12 months. Plan is for lower extremity arterial Doppler studies in our [place] office next week and with close follow up with Dr. Gwenlyn Found in 1 to 2 weeks thereafter.  Consultants: None  General: Well developed, well nourished, NAD Neck: Negative for carotid bruits. No JVD Lungs:Clear to ausculation bilaterally. No wheezes, rales, or rhonchi. Breathing is unlabored. Cardiovascular: RRR with S1 S2. No murmurs Extremities: No edema. Radial pulses 2+ bilaterally Neuro: Alert and oriented. No focal deficits. No facial asymmetry. MAE spontaneously. Psych: Responds to questions appropriately with normal affect.    The patient was seen and examined by Dr. Audie Box who feels that he is stable and ready for discharge  today, 06/27/2020.  Did the patient have an acute coronary syndrome (MI, NSTEMI, STEMI,  etc) this admission?:  No                               Did the patient have a percutaneous coronary intervention (stent / angioplasty)?:  No.   _____________  Discharge Vitals Blood pressure 114/90, pulse 78, temperature 97.8 F (36.6 C), temperature source Oral, resp. rate 12, height 5\' 11"  (1.803 m), weight 108 kg, SpO2 97 %.  Filed Weights   06/26/20 0930 06/26/20 1844  Weight: 108.9 kg 108 kg   Labs & Radiologic Studies    CBC Recent Labs    06/27/20 0241  WBC 9.2  HGB 12.7*  HCT 39.3  MCV 91.4  PLT 161   Basic Metabolic Panel Recent Labs    06/27/20 0241  NA 138  K 3.2*  CL 100  CO2 29  GLUCOSE 114*  BUN 27*  CREATININE 1.08  CALCIUM 7.6*   Liver Function Tests No results for input(s): AST, ALT, ALKPHOS, BILITOT, PROT, ALBUMIN in the last 72 hours. No results for input(s): LIPASE, AMYLASE in the last 72 hours. High Sensitivity Troponin:   No results for input(s): TROPONINIHS in the last 720 hours.  BNP Invalid input(s): POCBNP D-Dimer No results for input(s): DDIMER in the last 72 hours. Hemoglobin A1C No results for input(s): HGBA1C in the last 72 hours. Fasting Lipid Panel No results for input(s): CHOL, HDL, LDLCALC, TRIG, CHOLHDL, LDLDIRECT in the last 72 hours. Thyroid Function Tests No results for input(s): TSH, T4TOTAL, T3FREE, THYROIDAB in the last 72 hours.  Invalid input(s): FREET3 _____________  PERIPHERAL VASCULAR CATHETERIZATION  Result Date: 06/26/2020  [id number] LOCATION:  FACILITY: [facility] PHYSICIAN: Quay Burow, M.D. Apr 02, 1961 DATE OF PROCEDURE:  06/26/2020 DATE OF DISCHARGE: PV Angiogram/Intervention History obtained from chart review.Leighton A Chrobak is a 59 y.o.  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 He was referred by Dr. Debara Pickett for peripheral vascular valuation because of lifestyle limiting claudication.  I last saw him in the office 03/08/2020.History  factors include just continued tobacco use for years ago when he was smoking 2 packs a week for the last 40 years, treated hypertension and hyperlipidemia. Is not diabetic. His mother did die of a myocardial infarction in her 80s. He was referred to Dr. Debara Pickett because of peripheral edema and hyper lipidemia. A work-up is in progress. He was prescribed a statin which she could not tolerate. He has had lifestyle limiting claudication for last 6 months. Dopplers performed 02/28/2020 revealed a right ABI of 0.76 and a left of 0.87 with a high-frequency signal in his mid right SFA and left tibial vessel disease.  Since I saw him 4 months ago he has had progressive claudication on the right side and wishes to proceed with angiography and endovascular therapy. Pre Procedure Diagnosis: Peripheral arterial disease Post Procedure Diagnosis: Peripheral arterial disease Operators: Dr. Quay Burow Procedures Performed:  1.  Ultrasound-guided left common femoral access and antegrade stick right SFA  2.  Abdominal aortogram/bilateral iliac angiogram  3.  Right and left lower extremity angiography with runoff using bolus chase, digital subtraction step table technique  4.  DCB/stent proximal and distal right SFA  5 selective angiography left SFA, Perclose left SFA PROCEDURE DESCRIPTION: The patient was brought to the second floor [place] Cardiac cath lab in the the  postabsorptive state. He was premedicated with IV Versed and fentanyl. His right and left groins were prepped and shaved in usual sterile fashion. Xylocaine 1% was used for local anesthesia. A 5 French sheath was inserted into the right common femoral artery using standard Seldinger technique.  Ultrasound was used to identify the vessel and guide access in both the left common femoral artery and right SFA (antegrade).  A digital image was captured and placed the patient's chart.  On the pigtail was used for the entirety of the case.  Retrograde aortic  pressures monitored during the case. Angiographic Data: 1: Abdominal aorta-abdominal was free of significant atherosclerotic changes 2: Left lower extremity-tortuous iliac system.  60% mid, 90% distal left SFA, tandem 95 to 99% left popliteal artery stenoses with one-vessel runoff via the peroneal 3: Right lower extremity-80% proximal and mid right SFA stenosis with 99% calcified subtotaled right popliteal artery stenosis, 99% recanalized tibioperoneal trunk followed by a patent peroneal artery.   Mr. Wrinkle has bilateral SFA, popliteal and tibial vessel disease.  He has very tortuous iliacs with a narrow bifurcation making contralateral access technically challenging despite multiple attempts.  We will plan on proximal right SFA ultrasound-guided antegrade access followed by [facility] and nitinol stenting of the proximal and mid right SFA stenoses. Procedure Description: Antegrade access was obtained under ultrasound guidance of the proximal right SFA with a 6 [place] destination 45 cm sheath.  The patient received a total of 19,000's of heparin with an ending ACT of 246.  A total of 150 cc of contrast was administered to the patient during the case.  I was able to cross both lesions with an 035 versa core wire (300 cm).  I then performed DCB of the proximal mid lesion with 6 mm x 6 cm impact Admiral balloons.  I stented the distal lesion with a 6 mm x 60 mm long Abbott absolute Pro nitinol self-expanding stent in the more proximal lesion with a 7 mm x 60 mm long Abbott absolute Pro nitinol self-expanding stent postdilated with a 6 mm x 4 cm Jade balloon.  The final angiographic result was reduction of proximal and mid to distal 80% fairly focal right SFA stenoses to 0% residual.  I then exchanged the 6 [place] destination sheath for a short 6 French sheath over the 035 versa core wire.  I performed a left common femoral angiogram through the SideArm sheath and successfully deployed a Perclose hemostatic device. Final  Impression: Successful proximal mid right SFA DCB/nitinol self-expanding stenting with significant popliteal and infrapopliteal disease in the setting of lifestyle limiting claudication.  This was done through an antegrade stick.  The patient did receive 300 mg of p.o. Plavix at the end of the case.  He will need to stay on DAPT for at least 12 months.  The sheath will be removed once ACT falls below 170 and pressure held.  He will be hydrated overnight, discharged home in the morning.  We will get lower extremity arterial Doppler studies in our [facility] line office next week and I will see him back in follow-up 1 to 2 weeks thereafter. Quay Burow. MD, [facility] 06/26/2020 1:55 PM   Disposition   Pt is being discharged home today in good condition.  Follow-up Plans & Appointments    Follow-up Pierre at [facility] Follow up on 07/18/2020.   Specialty: Cardiology Why: Follow up lower extremity studies at 10:00am  Contact information: [address]  [id number] mc [place] [address] [phone]       Lorretta Harp, MD Follow up on 07/25/2020.   Specialties: Cardiology, Radiology Why: at 3:30pm  Contact information: [address] [place] [address] [phone]              Discharge Instructions    Call MD for:  difficulty breathing, headache or visual disturbances   Complete by: As directed    Call MD for:  extreme fatigue   Complete by: As directed    Call MD for:  hives   Complete by: As directed    Call MD for:  persistant dizziness or light-headedness   Complete by: As directed    Call MD for:  persistant nausea and vomiting   Complete by: As directed    Call MD for:  redness, tenderness, or signs of infection (pain, swelling, redness, odor or green/yellow discharge around incision site)   Complete by: As directed    Call MD for:  severe uncontrolled pain   Complete by: As directed    Call  MD for:  temperature >100.4   Complete by: As directed    Diet - low sodium heart healthy   Complete by: As directed    Discharge instructions   Complete by: As directed    PLEASE DO NOT [place]!!!!  Patients taking blood thinners should generally stay away from medicines like ibuprofen, Advil, Motrin, naproxen, and Aleve due to risk of stomach bleeding. You may take Tylenol as directed or talk to your primary doctor about alternatives.  Some studies suggest Prilosec/Omeprazole interacts with Plavix.  If you have issues with reflux, please use over the counter Protonix for less chance of interaction.   Increase activity slowly   Complete by: As directed      Discharge Medications   Allergies as of 06/27/2020      Reactions   Crestor [rosuvastatin Calcium] Other (See Comments)   Joint aches      Medication List    TAKE these medications   amLODipine 5 MG tablet Commonly known as: NORVASC Take 1 tablet (5 mg total) by mouth daily.   aspirin 81 MG EC tablet Take 1 tablet (81 mg total) by mouth daily. Swallow whole.   atorvastatin 80 MG tablet Commonly known as: LIPITOR Take 1 tablet (80 mg total) by mouth daily.   carvedilol 6.25 MG tablet Commonly known as: COREG Take 1 tablet (6.25 mg total) by mouth 2 (two) times daily with a meal.   clopidogrel 75 MG tablet Commonly known as: PLAVIX Take 1 tablet (75 mg total) by mouth daily with breakfast.   clotrimazole 1 % cream Commonly known as: LOTRIMIN Apply to affected area 2 times daily   hydrochlorothiazide 25 MG tablet Commonly known as: HYDRODIURIL Take 1 tablet by mouth once daily   losartan-hydrochlorothiazide 100-25 MG tablet Commonly known as: HYZAAR Take 1 tablet by mouth daily.   metFORMIN 500 MG tablet Commonly known as: GLUCOPHAGE Take 1 tablet (500 mg total) by mouth daily with breakfast.   predniSONE 20 MG tablet Commonly known as: DELTASONE Take 20 mg by mouth See admin  instructions. Taking tapered dose   sildenafil 50 MG tablet Commonly known as: Viagra Take 1 tablet (50 mg total) by mouth daily as needed for erectile dysfunction. Do not take more then 1 dose in 24 hour period.   triamcinolone cream 0.1 % Commonly known as: KENALOG Apply 1 application topically 2 (  two) times daily. To your hands.   Vitamin D (Ergocalciferol) 1.25 MG (50000 UNIT) Caps capsule Commonly known as: DRISDOL Take 1 capsule (50,000 Units total) by mouth every 7 (seven) days for 12 doses.        Outstanding Labs/Studies   None   Duration of Discharge Encounter   Greater than 30 minutes including physician time.  Signed, Kathyrn Drown, NP 06/27/2020, 8:42 AM

## 2020-07-05 ENCOUNTER — Telehealth: Payer: Self-pay | Admitting: Cardiovascular Disease

## 2020-07-05 NOTE — Telephone Encounter (Signed)
Renee the patient's wife is calling stating Samuel Butler is having swelling and bruising in the groin area as well as swelling in his right leg from his procedure performed on 06/26/20 and it has not gone down. An appt has been scheduled in regards to this with Kerin Ransom on 07/11/20. Please advise.

## 2020-07-05 NOTE — Telephone Encounter (Signed)
Spoke with pt regarding his symptoms. He states he has some bruising and testicular swelling on his right side. He confirms the bruising is changing colors and moving downward. I explained to him this was a normal finding and will eventually heal. He denies any testicular pain but does confirm bruising. He is also concerned with ongoing and worsening dependant edema on his right leg. He states the swelling begins at the knee and "works its way down to my ankle" by the end of the day. He denies any pain, loss of pulse, tingling or numbness, or cold anywhere on his right leg and foot. He states the swelling is slightly worse than it was prior to his procedure. He confirms he has been on his DAPT and has not missed a dose.   I will forward to Dr. Gwenlyn Found for review and recommendation. Pt understands he is to call 911 with the onset of severe swelling, pain, loss of feeling or cold on his leg. He was educated on the need to seek emergency help with the onset of severe back or pelvic pain, warmth, swelling or pulsation in his groin area as well.   We will contact him back with recommendation after reviewed by MD.

## 2020-07-06 NOTE — Telephone Encounter (Signed)
FMLA formed completed-given to Medical records to fax.    Disability forms to be completed at f/u visit (L. Kilroy 8/30 or Dr. Gwenlyn Found 9/14) follow up post PV angio.

## 2020-07-06 NOTE — Telephone Encounter (Signed)
Roselyn Reef from [employer] calling to follow up on FMLA paperwork.

## 2020-07-06 NOTE — Telephone Encounter (Signed)
Forms completed by Dr. Quay Burow , and faxed to [facility] on 07/06/20 , called and notified patient that forms were faxed today  fsw

## 2020-07-07 ENCOUNTER — Other Ambulatory Visit (HOSPITAL_COMMUNITY): Payer: Self-pay | Admitting: Cardiovascular Disease

## 2020-07-07 DIAGNOSIS — I739 Peripheral vascular disease, unspecified: Secondary | ICD-10-CM

## 2020-07-07 NOTE — Telephone Encounter (Signed)
Attempted to return call to patient regarding his groin pain. Left message for patient to return call.

## 2020-07-07 NOTE — Telephone Encounter (Signed)
   Pt is calling back he said the swelling has gone down but now he felt like the stent is moving when he walks and he feels like it poking him on his groin area.

## 2020-07-07 NOTE — Telephone Encounter (Signed)
Patient called in with pain in the right groin area where procedure was recently done. Patient states the swelling has gone down but when he woke this morning he has sharp pain at the site and when he moves he feels like something is "sticking him" in that area. Patient denies swelling, bleeding, and states his leg feels warm and denies pain in the leg it self. Advised this message would be sent to Dr. Gwenlyn Found for advice.

## 2020-07-08 NOTE — Telephone Encounter (Signed)
When is his ROV with me?

## 2020-07-11 ENCOUNTER — Ambulatory Visit: Payer: BC Managed Care – PPO | Admitting: Cardiovascular Disease

## 2020-07-11 ENCOUNTER — Ambulatory Visit: Payer: BC Managed Care – PPO | Admitting: Cardiology

## 2020-07-12 ENCOUNTER — Other Ambulatory Visit: Payer: Self-pay

## 2020-07-12 ENCOUNTER — Encounter: Payer: Self-pay | Admitting: Cardiovascular Disease

## 2020-07-12 ENCOUNTER — Ambulatory Visit: Payer: BC Managed Care – PPO | Admitting: Cardiovascular Disease

## 2020-07-12 VITALS — BP 140/92 | HR 100 | Ht 71.0 in | Wt 249.8 lb

## 2020-07-12 DIAGNOSIS — I739 Peripheral vascular disease, unspecified: Secondary | ICD-10-CM

## 2020-07-12 DIAGNOSIS — I10 Essential (primary) hypertension: Secondary | ICD-10-CM | POA: Diagnosis not present

## 2020-07-12 MED ORDER — FUROSEMIDE 20 MG PO TABS
20.0000 mg | ORAL_TABLET | Freq: Every day | ORAL | 1 refills | Status: DC
Start: 2020-07-12 — End: 2020-07-25

## 2020-07-12 NOTE — Telephone Encounter (Signed)
Spoke to patient's wife.Stated she would like her husband to be seen this afternoon.Stated he is having pain in right groin.Stated she is concerned he has increased swelling from waist down into both legs.Appointment scheduled with Dr.Berry this afternoon at 4:30 pm.Advised to arrive 15 min early.Advised he is being worked in.

## 2020-07-12 NOTE — Telephone Encounter (Signed)
Unable to LM for the pt... voicemail is full.. will respond to his My Chart requesting him to call us back.

## 2020-07-12 NOTE — Patient Instructions (Addendum)
Medication Instructions:  Start Furosemide 40mg  (2 tablets) a day for 3 days and then 20mg  (1 tablet) a day after.  Stop taking extra hydrochlorothiazide. May continue taking Losartan-hydrochlorothiazide  *If you need a refill on your cardiac medications before your next appointment, please call your pharmacy*   Lab Work: BMET in 7 days. Please return to office for this.  If you have labs (blood work) drawn today and your tests are completely normal, you will receive your results only by: Marland Kitchen MyChart Message (if you have MyChart) OR . A paper copy in the mail If you have any lab test that is abnormal or we need to change your treatment, we will call you to review the results.   Testing/Procedures: None Ordered At This Time.   Follow-Up: At [facility], you and your health needs are our priority.  As part of our continuing mission to provide you with exceptional heart care, we have created designated Provider Care Teams.  These Care Teams include your primary Cardiologist (physician) and Advanced Practice Providers (APPs -  Physician Assistants and Nurse Practitioners) who all work together to provide you with the care you need, when you need it.  We recommend signing up for the patient portal called "MyChart".  Sign up information is provided on this After Visit Summary.  MyChart is used to connect with patients for Virtual Visits (Telemedicine).  Patients are able to view lab/test results, encounter notes, upcoming appointments, etc.  Non-urgent messages can be sent to your provider as well.   To learn more about what you can do with MyChart, go to [email or web address].    Your next appointment:   Keep Current Appointment  The format for your next appointment:   In Person  Provider:   Quay Burow, MD

## 2020-07-12 NOTE — Telephone Encounter (Signed)
Follow Up:     Please call, wife called and said pt is swollen from the waist down to his feet and is in so much pain.

## 2020-07-12 NOTE — Telephone Encounter (Signed)
See open phone note RE: this message. Will close this encounter.

## 2020-07-12 NOTE — Telephone Encounter (Signed)
Please see pt My Chart messages... unable to connect with the pt.. calls continue to go to his voicemail. Pt messaged back he could not get through to the office. Will continue to try. LM on pts wife cell.

## 2020-07-12 NOTE — Telephone Encounter (Signed)
Spoke with the pts wife Samuel Butler on her mobile number, and she reports the pt continues to have bilateral leg edema up to his upper thighs... his right groin hurts with sharp pain with ambulation... no pain at rest. He says his extremities are warm, no drainage, no fever, no change in color. His groin site although was swollen a few days ago has improved. His legs are twice their "normal" size.  He is very uncomfortable and does not seem to be getting much better.   Will forward to Dr. Gwenlyn Found for review.

## 2020-07-12 NOTE — Progress Notes (Signed)
Mr. Balingit returns a for early follow-up.  I performed right SFA PTA and stenting in the proximal and mid right SFA using antegrade approach because of narrow right iliac bifurcation and severe tortuosity of his iliac arteries.  He says the pain in his right calf and foot is somewhat improved.  He does have some pain in his right groin when he sits up although on exam he does not have a bruit or hematoma.  He is scheduled to have Doppler studies next week.  He does have 2+ peripheral edema but apparently his hydrochlorothiazide was discontinued.  We will put him on furosemide 40 mg a day for 3 days and 20 mg a day thereafter.  Check a basic metabolic panel in 7 days.  See him back in 2 weeks for follow-up.  Lorretta Harp, M.D., [place], [facility], [place] [address] [place], [place]  [address]  [phone] 07/12/2020 4:59 PM

## 2020-07-12 NOTE — Telephone Encounter (Signed)
Patient being seen this afternoon. ?

## 2020-07-13 ENCOUNTER — Other Ambulatory Visit: Payer: Self-pay

## 2020-07-14 ENCOUNTER — Encounter: Payer: Self-pay | Admitting: Internal Medicine

## 2020-07-14 ENCOUNTER — Telehealth: Payer: Self-pay | Admitting: Cardiovascular Disease

## 2020-07-14 ENCOUNTER — Ambulatory Visit (INDEPENDENT_AMBULATORY_CARE_PROVIDER_SITE_OTHER): Payer: BC Managed Care – PPO | Admitting: Internal Medicine

## 2020-07-14 VITALS — BP 130/80 | HR 73 | Temp 97.8°F | Wt 249.9 lb

## 2020-07-14 DIAGNOSIS — F17200 Nicotine dependence, unspecified, uncomplicated: Secondary | ICD-10-CM

## 2020-07-14 DIAGNOSIS — R079 Chest pain, unspecified: Secondary | ICD-10-CM

## 2020-07-14 DIAGNOSIS — I739 Peripheral vascular disease, unspecified: Secondary | ICD-10-CM

## 2020-07-14 DIAGNOSIS — I10 Essential (primary) hypertension: Secondary | ICD-10-CM | POA: Diagnosis not present

## 2020-07-14 DIAGNOSIS — E1151 Type 2 diabetes mellitus with diabetic peripheral angiopathy without gangrene: Secondary | ICD-10-CM

## 2020-07-14 DIAGNOSIS — E785 Hyperlipidemia, unspecified: Secondary | ICD-10-CM

## 2020-07-14 MED ORDER — ISOSORBIDE MONONITRATE ER 30 MG PO TB24: 30.0000 mg | ORAL_TABLET | Freq: Every day | ORAL | 1 refills | Status: DC

## 2020-07-14 MED ORDER — METOPROLOL TARTRATE 100 MG PO TABS
ORAL_TABLET | ORAL | 0 refills | Status: DC
Start: 2020-07-14 — End: 2020-08-08

## 2020-07-14 MED ORDER — PANTOPRAZOLE SODIUM 40 MG PO TBEC: 40.0000 mg | DELAYED_RELEASE_TABLET | Freq: Every day | ORAL | 1 refills | Status: DC

## 2020-07-14 NOTE — Telephone Encounter (Signed)
MD spoke to Dr. Gwenlyn Found who saw patient 9/1.    Coronary CTA ordered per Dr. Gwenlyn Found.    Instructions sent via Mychart.  Message sent to scheduler.

## 2020-07-14 NOTE — Telephone Encounter (Signed)
Samuel Butler with Dr. Ledell Noss office is requesting to speak with the DOD to discuss abnormal EKG results and CP.  Pt c/o of Chest Pain: STAT if CP now or developed within 24 hours  1. Are you having CP right now? Yes   2. Are you experiencing any other symptoms (ex. SOB, nausea, vomiting, sweating)? No  3. How long have you been experiencing CP? Past few weeks, per Samuel Butler with Dr. Ledell Noss office  4. Is your CP continuous or coming and going? Coming and going (mainly when in bed)  5. Have you taken Nitroglycerin? No aware  ?

## 2020-07-14 NOTE — Progress Notes (Signed)
Established Patient Office Visit     This visit occurred during the SARS-CoV-2 public health emergency.  Safety protocols were in place, including screening questions prior to the visit, additional usage of staff PPE, and extensive cleaning of exam room while observing appropriate contact time as indicated for disinfecting solutions.    CC/Reason for Visit: Hospital follow-up, chest pain  HPI: Samuel Butler is a 59 y.o. male who is coming in today for the above mentioned reasons. Past Medical History is significant for: Hypertension, hyperlipidemia with a very elevated LDL of 278 on last check in July, erectile dysfunction, inflammatory bowel disease, type 2 diabetes that is well controlled on Metformin.  He has peripheral arterial disease and just had stenting of his right SFA by Dr. Gwenlyn Found on August 16.  He is coming in today for follow-up from this visit.  He is still having some surgical site pain and lower extremity edema.  He visited Dr. Gwenlyn Found on 9/1 and was started on furosemide.  He took his first dose only this morning.  He tells me that for the past 2 days he started having left-sided chest pain, not really exertional but worse when lying down at night.  He has never had this type of chest pain before.  He has no associated symptoms: No nausea, no vomiting, no dizziness, no diaphoresis, no palpitations.  Past Medical/Surgical History: Past Medical History:  Diagnosis Date  . DDD (degenerative disc disease), lumbar    per his report  . Diabetes mellitus without complication ([place])   . Gout 04/16/2013  . Hyperlipidemia   . Hypertension   . IBD (inflammatory bowel disease) 04/05/2016  . Tobacco use disorder 03/28/2015    Past Surgical History:  Procedure Laterality Date  . ABDOMINAL AORTOGRAM W/LOWER EXTREMITY Right 06/26/2020   Procedure: ABDOMINAL AORTOGRAM W/LOWER EXTREMITY;  Surgeon: Lorretta Harp, MD;  Location: [facility];  Service: Cardiovascular;  Laterality:  Right;  . COLONOSCOPY    . PERIPHERAL VASCULAR INTERVENTION  06/26/2020   Procedure: PERIPHERAL VASCULAR INTERVENTION;  Surgeon: Lorretta Harp, MD;  Location: [facility];  Service: Cardiovascular;;  Right SFA  . TONSILLECTOMY      Social History:  reports that he quit smoking about 3 years ago. His smoking use included cigarettes. He has never used smokeless tobacco. He reports current alcohol use. He reports that he does not use drugs.  Allergies: Allergies  Allergen Reactions  . Crestor [Rosuvastatin Calcium] Other (See Comments)    Joint aches    Family History:  Family History  Problem Relation Age of Onset  . Hypertension Father   . Diabetes Father   . Stroke Father      Current Outpatient Medications:  .  amLODipine (NORVASC) 5 MG tablet, Take 1 tablet (5 mg total) by mouth daily., Disp: 90 tablet, Rfl: 1 .  aspirin EC 81 MG EC tablet, Take 1 tablet (81 mg total) by mouth daily. Swallow whole., Disp: 30 tablet, Rfl: 11 .  atorvastatin (LIPITOR) 80 MG tablet, Take 1 tablet (80 mg total) by mouth daily., Disp: 90 tablet, Rfl: 1 .  clopidogrel (PLAVIX) 75 MG tablet, Take 1 tablet (75 mg total) by mouth daily with breakfast., Disp: 90 tablet, Rfl: 3 .  furosemide (LASIX) 20 MG tablet, Take 1 tablet (20 mg total) by mouth daily. Please take 40 mg (2 tablets) a day for 3 days and then start 20 mg (1 tablet) a day thereafter, Disp: 60  tablet, Rfl: 1 .  metFORMIN (GLUCOPHAGE) 500 MG tablet, Take 1 tablet (500 mg total) by mouth daily with breakfast., Disp: 90 tablet, Rfl: 1 .  predniSONE (DELTASONE) 20 MG tablet, Take 20 mg by mouth See admin instructions. Taking tapered dose, Disp: , Rfl:  .  sildenafil (VIAGRA) 50 MG tablet, Take 1 tablet (50 mg total) by mouth daily as needed for erectile dysfunction. Do not take more then 1 dose in 24 hour period., Disp: 10 tablet, Rfl: 0 .  Vitamin D, Ergocalciferol, (DRISDOL) 1.25 MG (50000 UNIT) CAPS capsule, Take 1 capsule (50,000  Units total) by mouth every 7 (seven) days for 12 doses., Disp: 12 capsule, Rfl: 0 .  carvedilol (COREG) 6.25 MG tablet, Take 1 tablet (6.25 mg total) by mouth 2 (two) times daily with a meal. (Patient not taking: Reported on 07/14/2020), Disp: 180 tablet, Rfl: 1 .  isosorbide mononitrate (IMDUR) 30 MG 24 hr tablet, Take 1 tablet (30 mg total) by mouth daily., Disp: 90 tablet, Rfl: 1 .  losartan-hydrochlorothiazide (HYZAAR) 100-25 MG tablet, Take 1 tablet by mouth daily. (Patient not taking: Reported on 07/14/2020), Disp: 90 tablet, Rfl: 1 .  pantoprazole (PROTONIX) 40 MG tablet, Take 1 tablet (40 mg total) by mouth daily., Disp: 90 tablet, Rfl: 1  Review of Systems:  Constitutional: Denies fever, chills, diaphoresis, appetite change and fatigue.  HEENT: Denies photophobia, eye pain, redness, hearing loss, ear pain, congestion, sore throat, rhinorrhea, sneezing, mouth sores, trouble swallowing, neck pain, neck stiffness and tinnitus.   Respiratory: Denies SOB, DOE, cough and wheezing.   Cardiovascular: Denies  Palpitations. Gastrointestinal: Denies nausea, vomiting, abdominal pain, diarrhea, constipation, blood in stool and abdominal distention.  Genitourinary: Denies dysuria, urgency, frequency, hematuria, flank pain and difficulty urinating.  Endocrine: Denies: hot or cold intolerance, sweats, changes in hair or nails, polyuria, polydipsia. Musculoskeletal: Denies myalgias, back pain, joint swelling, arthralgias and gait problem.  Skin: Denies pallor, rash and wound.  Neurological: Denies dizziness, seizures, syncope, weakness, light-headedness, numbness and headaches.  Hematological: Denies adenopathy. Easy bruising, personal or family bleeding history  Psychiatric/Behavioral: Denies suicidal ideation, mood changes, confusion, nervousness, sleep disturbance and agitation    Physical Exam: Vitals:   07/14/20 0724  BP: 130/80  Pulse: 73  Temp: 97.8 F (36.6 C)  TempSrc: Oral  SpO2: 97%    Weight: 249 lb 14.4 oz (113.4 kg)    Body mass index is 34.85 kg/m.   Constitutional: NAD, calm, comfortable Eyes: PERRL, lids and conjunctivae normal ENMT: Mucous membranes are moist.  Respiratory: clear to auscultation bilaterally, no wheezing, no crackles. Normal respiratory effort. No accessory muscle use.  Cardiovascular: Regular rate and rhythm, no murmurs / rubs / gallops.  2+ pitting lower extremity edema.   Neurologic: Grossly intact and nonfocal. Psychiatric: Normal judgment and insight. Alert and oriented x 3. Normal mood.    Impression and Plan:  Chest pain, unspecified type -He describes nonexertional, atypical chest pain, however he is a known vasculopath with multiple CAD risk factors. -I do not see any cardiac catheterizations on file. -EKG done today and interpreted by myself shows sinus rhythm at a rate of 70, left axis deviation, lateral T wave inversions with Q waves in the anterior leads.  There is an occasional PVC.  The lateral T wave inversions are new as compared to EKG from April. -Have discussed case with cardiologist, Dr. Gwenlyn Found: Start imdur, PPI, he will arrange for quick OP coronary CT and follow up in office next week -His medication  regimen includes aspirin, Plavix, statin, losartan, carvedilol and furosemide which he just started today.  Peripheral arterial disease ([place])  -Status post recent right FSA stenting. -Followed by Dr. Gwenlyn Found.  Hyperlipidemia, unspecified hyperlipidemia type  -With an impressive LDL cholesterol of 278 in July 2021.  Since I saw him at that visit he has resumed taking his atorvastatin 80 mg. -Too soon to recheck lipids.  Essential hypertension, benign  -Well-controlled.  Type 2 diabetes mellitus with diabetic peripheral angiopathy without gangrene, without long-term current use of insulin ([place]) -Well-controlled on low-dose Metformin-A1c of 6.0 in July 2021.  Tobacco use disorder -He has now quit smoking.    Patient  Instructions  -Nice seeing you today!!  -Per Dr. Kennon Holter instructions: start Imdur 30 mg daily, start protonix 40 mg daily.  -DO NOT TAKE SILDENAFIL (Viagra) WHILE ON IMDUR.  -He will schedule you for a cardiac CT scan and follow up in the office for next week.     Lelon Frohlich, MD [facility]

## 2020-07-14 NOTE — Patient Instructions (Signed)
-  Nice seeing you today!!  -Per Dr. Kennon Holter instructions: start Imdur 30 mg daily, start protonix 40 mg daily.  -DO NOT TAKE SILDENAFIL (Viagra) WHILE ON IMDUR.  -He will schedule you for a cardiac CT scan and follow up in the office for next week.

## 2020-07-18 ENCOUNTER — Other Ambulatory Visit (HOSPITAL_COMMUNITY): Payer: Self-pay | Admitting: Cardiovascular Disease

## 2020-07-18 ENCOUNTER — Ambulatory Visit (HOSPITAL_COMMUNITY)
Admission: RE | Admit: 2020-07-18 | Discharge: 2020-07-18 | Disposition: A | Discharge status: Home / Self Care | Payer: BC Managed Care – PPO | Source: Ambulatory Visit | Attending: Cardiovascular Disease | Admitting: Cardiovascular Disease

## 2020-07-18 ENCOUNTER — Other Ambulatory Visit: Payer: Self-pay

## 2020-07-18 DIAGNOSIS — I739 Peripheral vascular disease, unspecified: Secondary | ICD-10-CM

## 2020-07-19 ENCOUNTER — Other Ambulatory Visit: Payer: Self-pay

## 2020-07-19 ENCOUNTER — Telehealth: Payer: Self-pay

## 2020-07-19 DIAGNOSIS — I739 Peripheral vascular disease, unspecified: Secondary | ICD-10-CM

## 2020-07-19 NOTE — Telephone Encounter (Signed)
Yes

## 2020-07-19 NOTE — Telephone Encounter (Signed)
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   Thanks!  Will route to MD to clarify

## 2020-07-24 NOTE — Telephone Encounter (Signed)
Disabilty forms completed by Dr. Quay Burow and faxed to Sharrie Rothman at [employer] on 07/24/20. Patient notified he wants a copy of paperwork mailed to his home address.Sharrie Rothman had called the patient to let him know she received the paperwork also.Copy made of paperwork and will be mailed to patient.07/24/20  fsw

## 2020-07-25 ENCOUNTER — Ambulatory Visit: Payer: BC Managed Care – PPO | Admitting: Cardiovascular Disease

## 2020-07-25 ENCOUNTER — Encounter: Payer: Self-pay | Admitting: Cardiovascular Disease

## 2020-07-25 ENCOUNTER — Encounter: Payer: Self-pay | Admitting: Internal Medicine

## 2020-07-25 ENCOUNTER — Other Ambulatory Visit: Payer: Self-pay

## 2020-07-25 VITALS — BP 136/90 | HR 93 | Ht 71.0 in | Wt 243.6 lb

## 2020-07-25 DIAGNOSIS — Z1211 Encounter for screening for malignant neoplasm of colon: Secondary | ICD-10-CM

## 2020-07-25 DIAGNOSIS — Z79899 Other long term (current) drug therapy: Secondary | ICD-10-CM

## 2020-07-25 DIAGNOSIS — R600 Localized edema: Secondary | ICD-10-CM

## 2020-07-25 MED ORDER — LOSARTAN POTASSIUM 50 MG PO TABS
50.0000 mg | ORAL_TABLET | Freq: Every day | ORAL | 3 refills | Status: DC
Start: 2020-07-25 — End: 2021-08-22

## 2020-07-25 MED ORDER — FUROSEMIDE 40 MG PO TABS: 40.0000 mg | ORAL_TABLET | Freq: Every day | ORAL | 3 refills | Status: DC

## 2020-07-25 NOTE — Progress Notes (Signed)
Mr. Dion returns today for follow-up of edema.  I saw him approximately 2 weeks ago.  I did put him on furosemide 40 mg for 3 days and 20 mg after that.  He is lost approximate 5 pounds but still has 2+ pitting edema.  He also complains of some shortness of breath.  2D echo was performed on 03/03/2020 that showed normal LV systolic function with grade 1 diastolic dysfunction.  He does admit to some dietary discretion with regards to salt.  His right calf claudication is somewhat improved as have his ABIs.  His biggest complaint is as above edema.  I am going to increase his furosemide to 40 mg a day, discontinue his amlodipine and increase his losartan we will check a be met in 7 to 10 days.  He will see a APP back in 1 month in the back in 2 months.  Lorretta Harp, M.D., [place], [facility], [place] [address] [place], [place]  [address]  [phone] 07/25/2020 4:16 PM

## 2020-07-25 NOTE — Patient Instructions (Signed)
Medication Instructions:  STOP amlodipine INCREASE Lasix to 40 mg daily INCREASE Losartan to 50 mg daily  *If you need a refill on your cardiac medications before your next appointment, please call your pharmacy*   Lab Work: BMET IN 1 week  If you have labs (blood work) drawn today and your tests are completely normal, you will receive your results only by: Marland Kitchen MyChart Message (if you have MyChart) OR . A paper copy in the mail If you have any lab test that is abnormal or we need to change your treatment, we will call you to review the results.   Follow-Up: At [facility], you and your health needs are our priority.  As part of our continuing mission to provide you with exceptional heart care, we have created designated Provider Care Teams.  These Care Teams include your primary Cardiologist (physician) and Advanced Practice Providers (APPs -  Physician Assistants and Nurse Practitioners) who all work together to provide you with the care you need, when you need it.  We recommend signing up for the patient portal called "MyChart".  Sign up information is provided on this After Visit Summary.  MyChart is used to connect with patients for Virtual Visits (Telemedicine).  Patients are able to view lab/test results, encounter notes, upcoming appointments, etc.  Non-urgent messages can be sent to your provider as well.   To learn more about what you can do with MyChart, go to [email or web address].    Your next appointment:   1 month(s)  The format for your next appointment:   In Person  Provider:    Kerin Ransom, PA-C  Sande Rives, PA-C  Coletta Memos, FNP  2 months with Dr. Gwenlyn Found

## 2020-07-27 ENCOUNTER — Telehealth: Payer: Self-pay | Admitting: Cardiovascular Disease

## 2020-07-27 NOTE — Telephone Encounter (Signed)
Patient is requesting to switch from Dr. Gwenlyn Found to Dr. Tamala Julian.

## 2020-07-31 NOTE — Telephone Encounter (Signed)
That’s fine with me

## 2020-08-02 ENCOUNTER — Telehealth: Payer: Self-pay | Admitting: Cardiovascular Disease

## 2020-08-02 NOTE — Telephone Encounter (Signed)
Medical records now as FMLA forms.

## 2020-08-02 NOTE — Telephone Encounter (Signed)
Received medical records request from [place] , faxed records 08/02/20  fsw

## 2020-08-03 ENCOUNTER — Inpatient Hospital Stay (HOSPITAL_COMMUNITY)
Admission: EM | Admit: 2020-08-03 | Discharge: 2020-08-08 | DRG: 546 | Disposition: A | Discharge status: Home / Self Care | Payer: BC Managed Care – PPO | Attending: Internal Medicine | Admitting: Internal Medicine

## 2020-08-03 ENCOUNTER — Encounter (HOSPITAL_COMMUNITY): Payer: Self-pay | Admitting: Emergency Medicine

## 2020-08-03 ENCOUNTER — Other Ambulatory Visit: Payer: Self-pay

## 2020-08-03 ENCOUNTER — Emergency Department (HOSPITAL_COMMUNITY): Payer: BC Managed Care – PPO

## 2020-08-03 DIAGNOSIS — E119 Type 2 diabetes mellitus without complications: Secondary | ICD-10-CM | POA: Diagnosis not present

## 2020-08-03 DIAGNOSIS — Z833 Family history of diabetes mellitus: Secondary | ICD-10-CM

## 2020-08-03 DIAGNOSIS — I351 Nonrheumatic aortic (valve) insufficiency: Secondary | ICD-10-CM | POA: Diagnosis not present

## 2020-08-03 DIAGNOSIS — E669 Obesity, unspecified: Secondary | ICD-10-CM | POA: Diagnosis present

## 2020-08-03 DIAGNOSIS — R601 Generalized edema: Secondary | ICD-10-CM | POA: Diagnosis not present

## 2020-08-03 DIAGNOSIS — Z8249 Family history of ischemic heart disease and other diseases of the circulatory system: Secondary | ICD-10-CM

## 2020-08-03 DIAGNOSIS — F17200 Nicotine dependence, unspecified, uncomplicated: Secondary | ICD-10-CM | POA: Diagnosis present

## 2020-08-03 DIAGNOSIS — E8809 Other disorders of plasma-protein metabolism, not elsewhere classified: Secondary | ICD-10-CM | POA: Diagnosis present

## 2020-08-03 DIAGNOSIS — R739 Hyperglycemia, unspecified: Secondary | ICD-10-CM

## 2020-08-03 DIAGNOSIS — I129 Hypertensive chronic kidney disease with stage 1 through stage 4 chronic kidney disease, or unspecified chronic kidney disease: Secondary | ICD-10-CM | POA: Diagnosis present

## 2020-08-03 DIAGNOSIS — R609 Edema, unspecified: Secondary | ICD-10-CM

## 2020-08-03 DIAGNOSIS — N182 Chronic kidney disease, stage 2 (mild): Secondary | ICD-10-CM | POA: Diagnosis present

## 2020-08-03 DIAGNOSIS — Z20822 Contact with and (suspected) exposure to covid-19: Secondary | ICD-10-CM | POA: Diagnosis present

## 2020-08-03 DIAGNOSIS — K7460 Unspecified cirrhosis of liver: Secondary | ICD-10-CM | POA: Diagnosis present

## 2020-08-03 DIAGNOSIS — E1151 Type 2 diabetes mellitus with diabetic peripheral angiopathy without gangrene: Secondary | ICD-10-CM | POA: Diagnosis present

## 2020-08-03 DIAGNOSIS — E785 Hyperlipidemia, unspecified: Secondary | ICD-10-CM | POA: Diagnosis present

## 2020-08-03 DIAGNOSIS — N179 Acute kidney failure, unspecified: Secondary | ICD-10-CM | POA: Diagnosis present

## 2020-08-03 DIAGNOSIS — E1121 Type 2 diabetes mellitus with diabetic nephropathy: Secondary | ICD-10-CM

## 2020-08-03 DIAGNOSIS — Z823 Family history of stroke: Secondary | ICD-10-CM | POA: Diagnosis not present

## 2020-08-03 DIAGNOSIS — I739 Peripheral vascular disease, unspecified: Secondary | ICD-10-CM | POA: Diagnosis present

## 2020-08-03 DIAGNOSIS — M109 Gout, unspecified: Secondary | ICD-10-CM | POA: Diagnosis present

## 2020-08-03 DIAGNOSIS — E854 Organ-limited amyloidosis: Principal | ICD-10-CM | POA: Diagnosis present

## 2020-08-03 DIAGNOSIS — N08 Glomerular disorders in diseases classified elsewhere: Secondary | ICD-10-CM | POA: Diagnosis present

## 2020-08-03 DIAGNOSIS — I361 Nonrheumatic tricuspid (valve) insufficiency: Secondary | ICD-10-CM | POA: Diagnosis not present

## 2020-08-03 DIAGNOSIS — D649 Anemia, unspecified: Secondary | ICD-10-CM | POA: Diagnosis present

## 2020-08-03 DIAGNOSIS — I10 Essential (primary) hypertension: Secondary | ICD-10-CM | POA: Diagnosis not present

## 2020-08-03 DIAGNOSIS — N049 Nephrotic syndrome with unspecified morphologic changes: Secondary | ICD-10-CM | POA: Diagnosis not present

## 2020-08-03 DIAGNOSIS — Z6834 Body mass index (BMI) 34.0-34.9, adult: Secondary | ICD-10-CM | POA: Diagnosis not present

## 2020-08-03 DIAGNOSIS — E1122 Type 2 diabetes mellitus with diabetic chronic kidney disease: Secondary | ICD-10-CM | POA: Diagnosis present

## 2020-08-03 DIAGNOSIS — E876 Hypokalemia: Secondary | ICD-10-CM | POA: Diagnosis present

## 2020-08-03 LAB — RESPIRATORY PANEL BY RT PCR (FLU A&B, COVID)
Influenza A by PCR: NEGATIVE
Influenza B by PCR: NEGATIVE
SARS Coronavirus 2 by RT PCR: NEGATIVE

## 2020-08-03 LAB — CBC
HCT: 39.2 % (ref 39.0–52.0)
Hemoglobin: 12.7 g/dL — ABNORMAL LOW (ref 13.0–17.0)
MCH: 30.8 pg (ref 26.0–34.0)
MCHC: 32.4 g/dL (ref 30.0–36.0)
MCV: 95.1 fL (ref 80.0–100.0)
Platelets: 268 10*3/uL (ref 150–400)
RBC: 4.12 MIL/uL — ABNORMAL LOW (ref 4.22–5.81)
RDW: 15.4 % (ref 11.5–15.5)
WBC: 8.2 10*3/uL (ref 4.0–10.5)
nRBC: 0 % (ref 0.0–0.2)

## 2020-08-03 LAB — BASIC METABOLIC PANEL
Anion gap: 10 (ref 5–15)
BUN: 12 mg/dL (ref 6–20)
CO2: 28 mmol/L (ref 22–32)
Calcium: 7.8 mg/dL — ABNORMAL LOW (ref 8.9–10.3)
Chloride: 103 mmol/L (ref 98–111)
Creatinine, Ser: 1.27 mg/dL — ABNORMAL HIGH (ref 0.61–1.24)
GFR calc Af Amer: >60 mL/min (ref >60)
GFR calc non Af Amer: >60 mL/min (ref >60)
Glucose, Bld: 128 mg/dL — ABNORMAL HIGH (ref 70–99)
Potassium: 3.5 mmol/L (ref 3.5–5.1)
Sodium: 141 mmol/L (ref 135–145)

## 2020-08-03 LAB — TROPONIN I (HIGH SENSITIVITY)
Troponin I (High Sensitivity): 144 ng/L (ref <18)
Troponin I (High Sensitivity): 132 ng/L (ref <18)

## 2020-08-03 LAB — GLUCOSE, CAPILLARY: Glucose-Capillary: 105 mg/dL — ABNORMAL HIGH (ref 70–99)

## 2020-08-03 LAB — CBG MONITORING, ED: Glucose-Capillary: 132 mg/dL — ABNORMAL HIGH (ref 70–99)

## 2020-08-03 MED ORDER — ZOLPIDEM TARTRATE 5 MG PO TABS: 5.0000 mg | ORAL_TABLET | Freq: Every evening | ORAL | Status: DC | PRN

## 2020-08-03 MED ORDER — METHYLPREDNISOLONE SODIUM SUCC 40 MG IJ SOLR
40.0000 mg | Freq: Four times a day (QID) | INTRAMUSCULAR | Status: DC
  Administered 2020-08-03 – 2020-08-04 (×3): 40 mg via INTRAVENOUS
  Filled 2020-08-03 (×3): qty 1

## 2020-08-03 MED ORDER — ISOSORBIDE MONONITRATE ER 30 MG PO TB24
30.0000 mg | ORAL_TABLET | Freq: Every day | ORAL | Status: DC
  Administered 2020-08-04 – 2020-08-08 (×5): 30 mg via ORAL
  Filled 2020-08-03 (×5): qty 1

## 2020-08-03 MED ORDER — NEPRO/CARBSTEADY PO LIQD: 237.0000 mL | Freq: Three times a day (TID) | ORAL | Status: DC | PRN

## 2020-08-03 MED ORDER — ONDANSETRON HCL 4 MG/2ML IJ SOLN: 4.0000 mg | Freq: Four times a day (QID) | INTRAMUSCULAR | Status: DC | PRN

## 2020-08-03 MED ORDER — SORBITOL 70 % SOLN: 30.0000 mL | Status: DC | PRN

## 2020-08-03 MED ORDER — ACETAMINOPHEN ER 650 MG PO TBCR: 650.0000 mg | EXTENDED_RELEASE_TABLET | Freq: Three times a day (TID) | ORAL | Status: DC | PRN

## 2020-08-03 MED ORDER — CALCIUM CARBONATE ANTACID 1250 MG/5ML PO SUSP
500.0000 mg | Freq: Four times a day (QID) | ORAL | Status: DC | PRN
  Filled 2020-08-03: qty 5

## 2020-08-03 MED ORDER — VITAMIN D (ERGOCALCIFEROL) 1.25 MG (50000 UNIT) PO CAPS
50000.0000 [IU] | ORAL_CAPSULE | ORAL | Status: DC
  Administered 2020-08-03: 50000 [IU] via ORAL
  Filled 2020-08-03: qty 1

## 2020-08-03 MED ORDER — ACETAMINOPHEN 650 MG RE SUPP: 650.0000 mg | Freq: Four times a day (QID) | RECTAL | Status: DC | PRN

## 2020-08-03 MED ORDER — ACETAMINOPHEN 325 MG PO TABS: 650.0000 mg | ORAL_TABLET | Freq: Four times a day (QID) | ORAL | Status: DC | PRN

## 2020-08-03 MED ORDER — ATORVASTATIN CALCIUM 80 MG PO TABS
80.0000 mg | ORAL_TABLET | Freq: Every day | ORAL | Status: DC
  Administered 2020-08-04 – 2020-08-08 (×5): 80 mg via ORAL
  Filled 2020-08-03 (×5): qty 1

## 2020-08-03 MED ORDER — ONDANSETRON HCL 4 MG PO TABS: 4.0000 mg | ORAL_TABLET | Freq: Four times a day (QID) | ORAL | Status: DC | PRN

## 2020-08-03 MED ORDER — CAMPHOR-MENTHOL 0.5-0.5 % EX LOTN
1.0000 "application " | TOPICAL_LOTION | Freq: Three times a day (TID) | CUTANEOUS | Status: DC | PRN
  Filled 2020-08-03: qty 222

## 2020-08-03 MED ORDER — HYDROXYZINE HCL 25 MG PO TABS: 25.0000 mg | ORAL_TABLET | Freq: Three times a day (TID) | ORAL | Status: DC | PRN

## 2020-08-03 MED ORDER — MORPHINE SULFATE (PF) 4 MG/ML IV SOLN
6.0000 mg | Freq: Once | INTRAVENOUS | Status: AC
  Administered 2020-08-03: 6 mg via INTRAVENOUS
  Filled 2020-08-03: qty 2

## 2020-08-03 MED ORDER — MORPHINE SULFATE (PF) 4 MG/ML IV SOLN: 4.0000 mg | INTRAVENOUS | Status: DC | PRN

## 2020-08-03 MED ORDER — FUROSEMIDE 10 MG/ML IJ SOLN
40.0000 mg | Freq: Two times a day (BID) | INTRAMUSCULAR | Status: DC
  Administered 2020-08-04 – 2020-08-07 (×7): 40 mg via INTRAVENOUS
  Filled 2020-08-03 (×9): qty 4

## 2020-08-03 MED ORDER — PANTOPRAZOLE SODIUM 40 MG PO TBEC
40.0000 mg | DELAYED_RELEASE_TABLET | Freq: Every day | ORAL | Status: DC
  Administered 2020-08-04 – 2020-08-08 (×5): 40 mg via ORAL
  Filled 2020-08-03 (×5): qty 1

## 2020-08-03 MED ORDER — DOCUSATE SODIUM 283 MG RE ENEM
1.0000 | ENEMA | RECTAL | Status: DC | PRN
  Filled 2020-08-03: qty 1

## 2020-08-03 MED ORDER — DAPAGLIFLOZIN PROPANEDIOL 10 MG PO TABS
10.0000 mg | ORAL_TABLET | Freq: Every day | ORAL | Status: DC
  Administered 2020-08-04 – 2020-08-06 (×3): 10 mg via ORAL
  Filled 2020-08-03 (×3): qty 1

## 2020-08-03 MED ORDER — LOSARTAN POTASSIUM 50 MG PO TABS
50.0000 mg | ORAL_TABLET | Freq: Every day | ORAL | Status: DC
  Administered 2020-08-04 – 2020-08-08 (×5): 50 mg via ORAL
  Filled 2020-08-03 (×5): qty 1

## 2020-08-03 NOTE — ED Provider Notes (Addendum)
[facility] EMERGENCY DEPARTMENT Provider Note   CSN: [id number] Arrival date & time: 08/03/20  1601     History Chief Complaint  Patient presents with  . Shortness of Breath  . Leg Swelling    Samuel Butler is a 59 y.o. male.  59 year old male who presents with worsening edema to his legs as well as his right upper extremity.  Has been dealing with this for some time and is currently be evaluated for possible nephrotic syndrome.  He states that he has not had any chest pain or shortness of breath.  States that several months ago he had swelling to the left arm but that the swelling on the right forearm going down to his hand is new for the past 3 to 4 days.  He has been tried on diuretics as well as steroids without resolve.  Patient did see his nephrologist today and note was reviewed.  He was recommended to come here for further management.        Past Medical History:  Diagnosis Date  . DDD (degenerative disc disease), lumbar    per his report  . Diabetes mellitus without complication ([place])   . Gout 04/16/2013  . Hyperlipidemia   . Hypertension   . IBD (inflammatory bowel disease) 04/05/2016  . Tobacco use disorder 03/28/2015    Patient Active Problem List   Diagnosis Date Noted  . Hypokalemia 06/27/2020  . Claudication in peripheral vascular disease ([place]) 06/26/2020  . Hypertension associated with diabetes ([place]) 05/25/2020  . Peripheral arterial disease ([place]) 03/08/2020  . Vitamin D deficiency 10/26/2019  . Hypoalbuminemia 10/26/2019  . IBD (inflammatory bowel disease) 04/05/2016  . Tobacco use disorder 03/28/2015  . Diet-controlled type 2 diabetes mellitus ([place]) 01/25/2014  . Essential hypertension, benign 04/16/2013  . Hyperlipemia 04/16/2013  . Gout 04/16/2013    Past Surgical History:  Procedure Laterality Date  . ABDOMINAL AORTOGRAM W/LOWER EXTREMITY Right 06/26/2020   Procedure: ABDOMINAL AORTOGRAM W/LOWER EXTREMITY;  Surgeon: Lorretta Harp, MD;  Location: [facility];  Service: Cardiovascular;  Laterality: Right;  . COLONOSCOPY    . PERIPHERAL VASCULAR INTERVENTION  06/26/2020   Procedure: PERIPHERAL VASCULAR INTERVENTION;  Surgeon: Lorretta Harp, MD;  Location: [facility];  Service: Cardiovascular;;  Right SFA  . TONSILLECTOMY         Family History  Problem Relation Age of Onset  . Hypertension Father   . Diabetes Father   . Stroke Father     Social History   Tobacco Use  . Smoking status: Former Smoker    Types: Cigarettes    Quit date: 10/23/2016    Years since quitting: 3.7  . Smokeless tobacco: Never Used  . Tobacco comment: per patient 5 cigarettes a day   Vaping Use  . Vaping Use: Never used  Substance Use Topics  . Alcohol use: Yes    Comment: occasional  . Drug use: No    Home Medications Prior to Admission medications   Medication Sig Start Date End Date Taking? Authorizing Provider  aspirin EC 81 MG EC tablet Take 1 tablet (81 mg total) by mouth daily. Swallow whole. 06/27/20   Tommie Raymond, NP  atorvastatin (LIPITOR) 80 MG tablet Take 1 tablet (80 mg total) by mouth daily. 05/25/20   Isaac Bliss, Rayford Halsted, MD  carvedilol (COREG) 6.25 MG tablet Take 1 tablet (6.25 mg total) by mouth 2 (two) times daily with a meal. Patient not taking: Reported  on 07/14/2020 12/23/19   Isaac Bliss, Rayford Halsted, MD  clopidogrel (PLAVIX) 75 MG tablet Take 1 tablet (75 mg total) by mouth daily with breakfast. 06/27/20   Tommie Raymond, NP  FARXIGA 10 MG TABS tablet Take 10 mg by mouth daily. 08/03/20   [provider]  furosemide (LASIX) 40 MG tablet Take 1 tablet (40 mg total) by mouth daily. 07/25/20 07/20/21  Lorretta Harp, MD  isosorbide mononitrate (IMDUR) 30 MG 24 hr tablet Take 1 tablet (30 mg total) by mouth daily. 07/14/20   Isaac Bliss, Rayford Halsted, MD  losartan (COZAAR) 50 MG tablet Take 1 tablet (50 mg total) by mouth daily. 07/25/20   Lorretta Harp, MD   metFORMIN (GLUCOPHAGE) 500 MG tablet Take 1 tablet (500 mg total) by mouth daily with breakfast. 05/25/20   Isaac Bliss, Rayford Halsted, MD  metoprolol tartrate (LOPRESSOR) 100 MG tablet Take 100 mg (1 tablet) TWO hours prior to CT 07/14/20   Lorretta Harp, MD  pantoprazole (PROTONIX) 40 MG tablet Take 1 tablet (40 mg total) by mouth daily. 07/14/20   Isaac Bliss, Rayford Halsted, MD  Vitamin D, Ergocalciferol, (DRISDOL) 1.25 MG (50000 UNIT) CAPS capsule Take 1 capsule (50,000 Units total) by mouth every 7 (seven) days for 12 doses. 05/26/20 08/12/20  Isaac Bliss, Rayford Halsted, MD    Allergies    Crestor [rosuvastatin calcium]  Review of Systems   Review of Systems  All other systems reviewed and are negative.   Physical Exam Updated Vital Signs BP (!) 118/91 (BP Location: Left Arm)   Pulse (!) 104   Temp 98.7 F (37.1 C) (Oral)   Resp 18   SpO2 99%   Physical Exam Vitals and nursing note reviewed.  Constitutional:      General: He is not in acute distress.    Appearance: Normal appearance. He is well-developed. He is not toxic-appearing.  HENT:     Head: Normocephalic and atraumatic.  Eyes:     General: Lids are normal.     Conjunctiva/sclera: Conjunctivae normal.     Pupils: Pupils are equal, round, and reactive to light.  Neck:     Thyroid: No thyroid mass.     Trachea: No tracheal deviation.  Cardiovascular:     Rate and Rhythm: Normal rate and regular rhythm.     Heart sounds: Normal heart sounds. No murmur heard.  No gallop.   Pulmonary:     Effort: Pulmonary effort is normal. No respiratory distress.     Breath sounds: Normal breath sounds. No stridor. No decreased breath sounds, wheezing, rhonchi or rales.  Abdominal:     General: Bowel sounds are normal. There is no distension.     Palpations: Abdomen is soft.     Tenderness: There is no abdominal tenderness. There is no rebound.  Musculoskeletal:        General: No tenderness. Normal range of motion.      Cervical back: Normal range of motion and neck supple.     Comments: Edema noted at the left forearm starting at the antecubital fossa going down to the fingertips.  Radial pulses 2+.  Bilateral 3+ edema noted on bilateral lower extremities.  Good dorsalis pedis pulses noted.  Neurovascular intact at both feet.  Skin:    General: Skin is warm and dry.     Findings: No abrasion or rash.  Neurological:     Mental Status: He is alert and oriented to person, place, and time.  GCS: GCS eye subscore is 4. GCS verbal subscore is 5. GCS motor subscore is 6.     Cranial Nerves: No cranial nerve deficit.     Sensory: No sensory deficit.  Psychiatric:        Speech: Speech normal.        Behavior: Behavior normal.     ED Results / Procedures / Treatments   Labs (all labs ordered are listed, but only abnormal results are displayed) Labs Reviewed  BASIC METABOLIC PANEL - Abnormal; Notable for the following components:      Result Value   Glucose, Bld 128 (*)    Creatinine, Ser 1.27 (*)    Calcium 7.8 (*)    All other components within normal limits  CBC - Abnormal; Notable for the following components:   RBC 4.12 (*)    Hemoglobin 12.7 (*)    All other components within normal limits  CBG MONITORING, ED - Abnormal; Notable for the following components:   Glucose-Capillary 132 (*)    All other components within normal limits  TROPONIN I (HIGH SENSITIVITY) - Abnormal; Notable for the following components:   Troponin I (High Sensitivity) 144 (*)    All other components within normal limits  RESPIRATORY PANEL BY RT PCR (FLU A&B, COVID)  URINALYSIS, ROUTINE W REFLEX MICROSCOPIC  TROPONIN I (HIGH SENSITIVITY)    EKG EKG Interpretation  Date/Time:  Thursday August 03 2020 16:07:57 EDT Ventricular Rate:  111 PR Interval:  158 QRS Duration: 88 QT Interval:  370 QTC Calculation: 503 R Axis:   -4 Text Interpretation: Sinus tachycardia with Premature supraventricular complexes Septal  infarct , age undetermined ST & T wave abnormality, consider lateral ischemia Abnormal ECG No old tracing to compare Confirmed by Lacretia Leigh ([id number]) on 08/03/2020 5:43:12 PM   Radiology DG Chest 2 View  Result Date: 08/03/2020 CLINICAL DATA:  Shortness of breath, leg swelling EXAM: CHEST - 2 VIEW COMPARISON:  Chest x-ray 12/18/2001 report without images. FINDINGS: The heart size and mediastinal contours are within normal limits. Bibasilar linear atelectasis. No focal consolidation. No pulmonary edema. No pleural effusion. No pneumothorax. No acute osseous abnormality. IMPRESSION: No active cardiopulmonary disease. Electronically Signed   By: Iven Finn M.D.   On: 08/03/2020 18:06    Procedures Procedures (including critical care time)  Medications Ordered in ED Medications - No data to display  ED Course  I have reviewed the triage vital signs and the nursing notes.  Pertinent labs & imaging results that were available during my care of the patient were reviewed by me and considered in my medical decision making (see chart for details).    MDM Rules/Calculators/A&P                          Patient is EKG without acute ischemic changes.  Troponin elevated at 144 but patient has no cardiac symptoms.  Will repeat.  Dopplers of his right upper extremity as well as bilateral lower extremities negative or DVT.  Due to patient's worsening ongoing edema it has been recommended that he be admitted by his nephrologist for work-up of possible nephrotic syndrome including renal biopsy.  Will consult hospitalist for admission Final Clinical Impression(s) / ED Diagnoses Final diagnoses:  None    Rx / DC Orders ED Discharge Orders    None       Lacretia Leigh, MD 08/03/20 1950    Lacretia Leigh, MD 08/03/20 2010

## 2020-08-03 NOTE — Progress Notes (Signed)
New Admission Note:  Arrival Method: Stretcher Mental Orientation: Alert and oriented x 4 Telemetry: N/A Assessment: Completed Skin: Warm and dry. Swollen RUE, LLL and RLL IV:  NSL Pain: Denies Tubes: N/A Safety Measures: Safety Fall Prevention Plan initiated.  Admission: Completed 5 M  Orientation: Patient has been orientated to the room, unit and the staff. Welcome booklet given.  Family: None  Orders have been reviewed and implemented. Will continue to monitor the patient. Call light has been placed within reach and bed alarm has been activated.   Sima Matas BSN, RN  Phone Number: [phone]

## 2020-08-03 NOTE — Progress Notes (Signed)
Right upper extremity venous duplex and bilateral lower extremity venous duplex completed. Refer to "CV Proc" under chart review to view preliminary results.  08/03/2020 7:52 PM Kelby Aline., MHA, RVT, RDCS, RDMS

## 2020-08-03 NOTE — ED Triage Notes (Addendum)
Pt states he had stent placed in R leg 1 month ago.  Reports continued swelling to R wrist, R elbow, bilateral ankles, and abd since surgery.  Also reports SOB.

## 2020-08-03 NOTE — Progress Notes (Signed)
Report received earlier. Room is ready.

## 2020-08-03 NOTE — H&P (Signed)
History and Physical   Katai A Sachse TZG:[id number] DOB: 11/16/1960 DOA: 08/03/2020  Referring MD/NP/PA: Dr. Lacretia Leigh  PCP: Isaac Bliss, Rayford Halsted, MD   Outpatient Specialists: [facility]  Patient coming from: Home  Chief Complaint: Shortness of breath and leg swelling  HPI: Samuel Butler is a 59 y.o. male with medical history significant of nephrotic syndrome apparently being followed by nephrology.  Patient has been scheduled for renal biopsy, hypertension, hyperlipidemia, diabetes, gout, inflammatory bowel disease and tobacco abuse who presented to the ER with shortness of breath bilateral lower extremity swelling as well as right elbow swelling and tenderness.  Patient was seen and evaluated.  He appears to have worsening edema but renal function slightly worsened.  He also has had worsening shortness of breath over his baseline.  His weight has also been increased.  He was on prednisone apparently outpatient for the treatment of his nephrotic syndrome.  Once he was tapered off he started having migratory arthralgias and the worsening edema.  His diuretic was increased a week earlier but does not seem to help with the swelling.  His right elbow is swollen tender and not able to extend it.  He has had history of gout and thinks he may be having a flareup.  Patient will be admitted with nephrology follow-up..  ED Course: Temperature 99.5 blood pressure 111/84 pulse 108 respirate 27 oxygen sat 93% room air.  His white count is 8.2 hemoglobin 12.7 platelets 268.  Urinalysis is essentially positive for RBC.  Vascular ultrasound the upper and lower extremities were negative for DVT.  BUN/creatinine are 12 and 1.27 respectively close to his baseline with normal GFR.  COVID-19 screen is negative.  Chest x-ray showed no acute findings.  Patient being admitted to the hospital for further evaluation and treatment  Review of Systems: As per HPI otherwise 10 point review of  systems negative.    Past Medical History:  Diagnosis Date  . DDD (degenerative disc disease), lumbar    per his report  . Diabetes mellitus without complication ([place])   . Gout 04/16/2013  . Hyperlipidemia   . Hypertension   . IBD (inflammatory bowel disease) 04/05/2016  . Tobacco use disorder 03/28/2015    Past Surgical History:  Procedure Laterality Date  . ABDOMINAL AORTOGRAM W/LOWER EXTREMITY Right 06/26/2020   Procedure: ABDOMINAL AORTOGRAM W/LOWER EXTREMITY;  Surgeon: Lorretta Harp, MD;  Location: [facility];  Service: Cardiovascular;  Laterality: Right;  . COLONOSCOPY    . PERIPHERAL VASCULAR INTERVENTION  06/26/2020   Procedure: PERIPHERAL VASCULAR INTERVENTION;  Surgeon: Lorretta Harp, MD;  Location: [facility];  Service: Cardiovascular;;  Right SFA  . TONSILLECTOMY       reports that he quit smoking about 3 years ago. His smoking use included cigarettes. He has never used smokeless tobacco. He reports current alcohol use. He reports that he does not use drugs.  Allergies  Allergen Reactions  . Crestor [Rosuvastatin Calcium] Other (See Comments)    Joint aches    Family History  Problem Relation Age of Onset  . Hypertension Father   . Diabetes Father   . Stroke Father      Prior to Admission medications   Medication Sig Start Date End Date Taking? Authorizing Provider  acetaminophen (TYLENOL) 650 MG CR tablet Take 650 mg by mouth every 8 (eight) hours as needed for pain.   Yes [provider]  aspirin EC 81 MG EC tablet Take  1 tablet (81 mg total) by mouth daily. Swallow whole. 06/27/20  Yes Kathyrn Drown D, NP  atorvastatin (LIPITOR) 80 MG tablet Take 1 tablet (80 mg total) by mouth daily. 05/25/20  Yes Isaac Bliss, Rayford Halsted, MD  clopidogrel (PLAVIX) 75 MG tablet Take 1 tablet (75 mg total) by mouth daily with breakfast. 06/27/20  Yes Kathyrn Drown D, NP  isosorbide mononitrate (IMDUR) 30 MG 24 hr tablet Take 1 tablet (30 mg  total) by mouth daily. 07/14/20  Yes Isaac Bliss, Rayford Halsted, MD  losartan (COZAAR) 50 MG tablet Take 1 tablet (50 mg total) by mouth daily. 07/25/20  Yes Lorretta Harp, MD  metFORMIN (GLUCOPHAGE) 500 MG tablet Take 1 tablet (500 mg total) by mouth daily with breakfast. 05/25/20  Yes Isaac Bliss, Rayford Halsted, MD  pantoprazole (PROTONIX) 40 MG tablet Take 1 tablet (40 mg total) by mouth daily. 07/14/20  Yes Isaac Bliss, Rayford Halsted, MD  torsemide (DEMADEX) 20 MG tablet Take 20 mg by mouth daily.   Yes [provider]  Vitamin D, Ergocalciferol, (DRISDOL) 1.25 MG (50000 UNIT) CAPS capsule Take 1 capsule (50,000 Units total) by mouth every 7 (seven) days for 12 doses. 05/26/20 08/12/20 Yes Erline Hau, MD  carvedilol (COREG) 6.25 MG tablet Take 1 tablet (6.25 mg total) by mouth 2 (two) times daily with a meal. Patient not taking: Reported on 08/03/2020 12/23/19   Isaac Bliss, Rayford Halsted, MD  FARXIGA 10 MG TABS tablet Take 10 mg by mouth daily. 08/03/20   [provider]  furosemide (LASIX) 40 MG tablet Take 1 tablet (40 mg total) by mouth daily. Patient not taking: Reported on 08/03/2020 07/25/20 07/20/21  Lorretta Harp, MD  metoprolol tartrate (LOPRESSOR) 100 MG tablet Take 100 mg (1 tablet) TWO hours prior to CT Patient not taking: Reported on 08/03/2020 07/14/20   Lorretta Harp, MD    Physical Exam: Vitals:   08/03/20 1609 08/03/20 1729 08/03/20 1851  BP: 111/84 (!) 118/91 (!) 127/91  Pulse: (!) 108 (!) 104 95  Resp: 18 18 (!) 21  Temp: 98.7 F (37.1 C)    TempSrc: Oral    SpO2: 97% 99% 95%      Constitutional: Acutely ill with anasarca in mild distress Vitals:   08/03/20 1609 08/03/20 1729 08/03/20 1851  BP: 111/84 (!) 118/91 (!) 127/91  Pulse: (!) 108 (!) 104 95  Resp: 18 18 (!) 21  Temp: 98.7 F (37.1 C)    TempSrc: Oral    SpO2: 97% 99% 95%   Eyes: PERRL, lids and conjunctivae normal ENMT: Mucous membranes are moist. Posterior pharynx  clear of any exudate or lesions.Normal dentition.  Neck: normal, supple, no masses, no thyromegaly Respiratory: Coarse breath sounds bilaterally,, no wheezing, no crackles. Normal respiratory effort. No accessory muscle use.  Cardiovascular: Sinus tachycardia, no murmurs / rubs / gallops.  3+ extremity edema. 2+ pedal pulses. No carotid bruits.  Abdomen: no tenderness, no masses palpated. No hepatosplenomegaly. Bowel sounds positive.  Musculoskeletal: no clubbing / cyanosis. No joint deformity upper and lower extremities. Good ROM, no contractures. Normal muscle tone.  Skin: no rashes, lesions, ulcers. No induration Neurologic: CN 2-12 grossly intact. Sensation intact, DTR normal. Strength 5/5 in all 4.  Psychiatric: Normal judgment and insight. Alert and oriented x 3. Normal mood.     Labs on Admission: I have personally reviewed following labs and imaging studies  CBC: Recent Labs  Lab 08/03/20 1618  WBC 8.2  HGB 12.7*  HCT 39.2  MCV 95.1  PLT 539   Basic Metabolic Panel: Recent Labs  Lab 08/03/20 1618  NA 141  K 3.5  CL 103  CO2 28  GLUCOSE 128*  BUN 12  CREATININE 1.27*  CALCIUM 7.8*   GFR: Estimated Creatinine Clearance: 78.2 mL/min (A) (by C-G formula based on SCr of 1.27 mg/dL (H)). Liver Function Tests: No results for input(s): AST, ALT, ALKPHOS, BILITOT, PROT, ALBUMIN in the last 168 hours. No results for input(s): LIPASE, AMYLASE in the last 168 hours. No results for input(s): AMMONIA in the last 168 hours. Coagulation Profile: No results for input(s): INR, PROTIME in the last 168 hours. Cardiac Enzymes: No results for input(s): CKTOTAL, CKMB, CKMBINDEX, TROPONINI in the last 168 hours. BNP (last 3 results) No results for input(s): PROBNP in the last 8760 hours. HbA1C: No results for input(s): HGBA1C in the last 72 hours. CBG: Recent Labs  Lab 08/03/20 1618  GLUCAP 132*   Lipid Profile: No results for input(s): CHOL, HDL, LDLCALC, TRIG, CHOLHDL,  LDLDIRECT in the last 72 hours. Thyroid Function Tests: No results for input(s): TSH, T4TOTAL, FREET4, T3FREE, THYROIDAB in the last 72 hours. Anemia Panel: No results for input(s): VITAMINB12, FOLATE, FERRITIN, TIBC, IRON, RETICCTPCT in the last 72 hours. Urine analysis:    Component Value Date/Time   COLORURINE YELLOW 04/14/2013 2058   APPEARANCEUR CLOUDY (A) 04/14/2013 2058   LABSPEC 1.006 04/14/2013 2058   PHURINE 7.0 04/14/2013 2058   GLUCOSEU NEGATIVE 04/14/2013 2058   HGBUR NEGATIVE 04/14/2013 2058   BILIRUBINUR NEGATIVE 04/14/2013 2058   KETONESUR NEGATIVE 04/14/2013 2058   PROTEINUR NEGATIVE 04/14/2013 2058   UROBILINOGEN 1.0 04/14/2013 2058   NITRITE NEGATIVE 04/14/2013 2058   LEUKOCYTESUR TRACE (A) 04/14/2013 2058   Sepsis Labs: @LABRCNTIP (procalcitonin:4,lacticidven:4) ) Recent Results (from the past 240 hour(s))  Respiratory Panel by RT PCR (Flu A&B, Covid) - Nasopharyngeal Swab     Status: None   Collection Time: 08/03/20  6:10 PM   Specimen: Nasopharyngeal Swab  Result Value Ref Range Status   SARS Coronavirus 2 by RT PCR NEGATIVE NEGATIVE Final    Comment: (NOTE) SARS-CoV-2 target nucleic acids are NOT DETECTED.  The SARS-CoV-2 RNA is generally detectable in upper respiratoy specimens during the acute phase of infection. The lowest concentration of SARS-CoV-2 viral copies this assay can detect is 131 copies/mL. A negative result does not preclude SARS-Cov-2 infection and should not be used as the sole basis for treatment or other patient management decisions. A negative result may occur with  improper specimen collection/handling, submission of specimen other than nasopharyngeal swab, presence of viral mutation(s) within the areas targeted by this assay, and inadequate number of viral copies (<131 copies/mL). A negative result must be combined with clinical observations, patient history, and epidemiological information. The expected result is  Negative.  Fact Sheet for Patients:  PinkCheek.be  Fact Sheet for Healthcare Providers:  GravelBags.it  This test is no t yet approved or cleared by the [place] FDA and  has been authorized for detection and/or diagnosis of SARS-CoV-2 by FDA under an Emergency Use Authorization (EUA). This EUA will remain  in effect (meaning this test can be used) for the duration of the COVID-19 declaration under Section 564(b)(1) of the Act, 21 U.S.C. section 360bbb-3(b)(1), unless the authorization is terminated or revoked sooner.     Influenza A by PCR NEGATIVE NEGATIVE Final   Influenza B by PCR NEGATIVE NEGATIVE Final    Comment: (NOTE) The Xpert Xpress SARS-CoV-2/FLU/RSV  assay is intended as an aid in  the diagnosis of influenza from Nasopharyngeal swab specimens and  should not be used as a sole basis for treatment. Nasal washings and  aspirates are unacceptable for Xpert Xpress SARS-CoV-2/FLU/RSV  testing.  Fact Sheet for Patients: PinkCheek.be  Fact Sheet for Healthcare Providers: GravelBags.it  This test is not yet approved or cleared by the [place] FDA and  has been authorized for detection and/or diagnosis of SARS-CoV-2 by  FDA under an Emergency Use Authorization (EUA). This EUA will remain  in effect (meaning this test can be used) for the duration of the  Covid-19 declaration under Section 564(b)(1) of the Act, 21  U.S.C. section 360bbb-3(b)(1), unless the authorization is  terminated or revoked. Performed at [facility], [place] [address] [place], [place] [address]      Radiological Exams on Admission: DG Chest 2 View  Result Date: 08/03/2020 CLINICAL DATA:  Shortness of breath, leg swelling EXAM: CHEST - 2 VIEW COMPARISON:  Chest x-ray 12/18/2001 report without images. FINDINGS: The heart size and mediastinal contours are within normal  limits. Bibasilar linear atelectasis. No focal consolidation. No pulmonary edema. No pleural effusion. No pneumothorax. No acute osseous abnormality. IMPRESSION: No active cardiopulmonary disease. Electronically Signed   By: Iven Finn M.D.   On: 08/03/2020 18:06   VAS [place] LOWER EXTREMITY VENOUS (DVT) (ONLY [facility] & [facility] 7a-7p)  Result Date: 08/03/2020  Lower Venous DVTStudy Indications: Bilateral lower extremity pitting edema.  Comparison Study: No prior study Performing Technologist: Maudry Mayhew MHA, RDMS, RVT, RDCS  Examination Guidelines: A complete evaluation includes B-mode imaging, spectral Doppler, color Doppler, and power Doppler as needed of all accessible portions of each vessel. Bilateral testing is considered an integral part of a complete examination. Limited examinations for reoccurring indications may be performed as noted. The reflux portion of the exam is performed with the patient in reverse Trendelenburg.  +---------+---------------+---------+-----------+----------+--------------+ RIGHT    CompressibilityPhasicitySpontaneityPropertiesThrombus Aging +---------+---------------+---------+-----------+----------+--------------+ CFV      Full           Yes      Yes                                 +---------+---------------+---------+-----------+----------+--------------+ SFJ      Full           Yes      Yes                                 +---------+---------------+---------+-----------+----------+--------------+ FV Prox  Full           Yes      Yes                                 +---------+---------------+---------+-----------+----------+--------------+ FV Mid   Full           Yes      Yes                                 +---------+---------------+---------+-----------+----------+--------------+ FV DistalFull                                                        +---------+---------------+---------+-----------+----------+--------------+  PFV       Full                                                        +---------+---------------+---------+-----------+----------+--------------+ POP      Full                                                        +---------+---------------+---------+-----------+----------+--------------+ PTV      Full                                                        +---------+---------------+---------+-----------+----------+--------------+ PERO     Full                                                        +---------+---------------+---------+-----------+----------+--------------+   +---------+---------------+---------+-----------+----------+--------------+ LEFT     CompressibilityPhasicitySpontaneityPropertiesThrombus Aging +---------+---------------+---------+-----------+----------+--------------+ CFV      Full           Yes      Yes                                 +---------+---------------+---------+-----------+----------+--------------+ SFJ      Full                                                        +---------+---------------+---------+-----------+----------+--------------+ FV Prox  Full                                                        +---------+---------------+---------+-----------+----------+--------------+ FV Mid   Full                                                        +---------+---------------+---------+-----------+----------+--------------+ FV DistalFull                                                        +---------+---------------+---------+-----------+----------+--------------+ PFV      Full                                                        +---------+---------------+---------+-----------+----------+--------------+  POP      Full           Yes      Yes                                 +---------+---------------+---------+-----------+----------+--------------+ PTV      Full                                                         +---------+---------------+---------+-----------+----------+--------------+ PERO     Full                                                        +---------+---------------+---------+-----------+----------+--------------+     Summary: RIGHT: - There is no evidence of deep vein thrombosis in the lower extremity.  - No cystic structure found in the popliteal fossa.  LEFT: - There is no evidence of deep vein thrombosis in the lower extremity.  - No cystic structure found in the popliteal fossa.  *See table(s) above for measurements and observations.    Preliminary    UE VENOUS DUPLEX ([place] & [facility] 7 am - 7 pm)  Result Date: 08/03/2020 UPPER VENOUS STUDY  Indications: Pain, and edema x3 days. Limitations: Patient position; restricted mobility. Comparison Study: No prior study Performing Technologist: Maudry Mayhew MHA, RDMS, RVT, RDCS  Examination Guidelines: A complete evaluation includes B-mode imaging, spectral Doppler, color Doppler, and power Doppler as needed of all accessible portions of each vessel. Bilateral testing is considered an integral part of a complete examination. Limited examinations for reoccurring indications may be performed as noted.  Right Findings: +----------+------------+---------+-----------+----------+-------+ RIGHT     CompressiblePhasicitySpontaneousPropertiesSummary +----------+------------+---------+-----------+----------+-------+ IJV           Full       Yes       Yes                      +----------+------------+---------+-----------+----------+-------+ Subclavian    Full       Yes       Yes                      +----------+------------+---------+-----------+----------+-------+ Axillary      Full       Yes       Yes                      +----------+------------+---------+-----------+----------+-------+ Brachial      Full       Yes       Yes                       +----------+------------+---------+-----------+----------+-------+ Radial        Full                                          +----------+------------+---------+-----------+----------+-------+ Ulnar         Full                                          +----------+------------+---------+-----------+----------+-------+  Cephalic      Full                                          +----------+------------+---------+-----------+----------+-------+ Basilic       Full                                          +----------+------------+---------+-----------+----------+-------+  Left Findings: +----------+------------+---------+-----------+----------+--------------+ LEFT      CompressiblePhasicitySpontaneousProperties   Summary     +----------+------------+---------+-----------+----------+--------------+ Subclavian                                          Not visualized +----------+------------+---------+-----------+----------+--------------+  Summary:  Right: No evidence of deep vein thrombosis in the upper extremity. No evidence of superficial vein thrombosis in the upper extremity.  *See table(s) above for measurements and observations.    Preliminary       Assessment/Plan Principal Problem:   Nephrotic syndrome Active Problems:   Essential hypertension, benign   Hyperlipemia   Gout   Diet-controlled type 2 diabetes mellitus (HCC)   Tobacco use disorder   Hypoalbuminemia   Peripheral arterial disease (HCC)   Hypokalemia   AKI (acute kidney injury) ([place])     #1 anasarca: Appears to be related to his nephrotic syndrome.  No obvious DVT.  No obvious CHF on chest x-ray.  Patient will be admitted and increase his steroids.  I will do IV Solu-Medrol as well as increased diuretics IV.  Get nephrology involved for further treatment.  #2 right elbow swelling and tenderness: Appears to be possibly due to gout.  I will be using steroids for now.  Pain management and  elevated.  May need to check uric acid levels.  #3 diabetes: Sliding scale insulin.  #4 hypertension: Blood pressure appears controlled at this point we will continue close monitoring.  #5 mild AKI: GFR is unchanged.  Hydrate slowly.  #6 tobacco use disorder: Counseling provided.  Continue treatment.   DVT prophylaxis: Lovenox Code Status: Full code Family Communication: No family at bedside Disposition Plan: Home most likely Consults called: Nephrology Admission status: Inpatient  Severity of Illness: The appropriate patient status for this patient is INPATIENT. Inpatient status is judged to be reasonable and necessary in order to provide the required intensity of service to ensure the patient's safety. The patient's presenting symptoms, physical exam findings, and initial radiographic and laboratory data in the context of their chronic comorbidities is felt to place them at high risk for further clinical deterioration. Furthermore, it is not anticipated that the patient will be medically stable for discharge from the hospital within 2 midnights of admission. The following factors support the patient status of inpatient.   " The patient's presenting symptoms include generalized edema and right elbow pain. " The worrisome physical exam findings include inflamed right elbow with tenderness. " The initial radiographic and laboratory data are worrisome because of no significant chest x-ray findings. " The chronic co-morbidities include nephrotic syndrome.   * I certify that at the point of admission it is my clinical judgment that the patient will require inpatient hospital care spanning beyond 2 midnights from the point of admission due to high intensity of service,  high risk for further deterioration and high frequency of surveillance required.Barbette Merino MD [facility] Pager [phone]  If 7PM-7AM, please contact night-coverage [email or web address] Password  [facility]  08/03/2020, 8:32 PM

## 2020-08-03 NOTE — Progress Notes (Signed)
Sending patient to [facility] ER for further evaluation and treatment (per patient preference). Once tapered off prednisone last week, he has been having migratory arthralgias and worsening edema in all extremities. Pain has been severe and increasing diuretic regimen in the last week has not helped. Does have nephrotic range proteinuria (UPC 4.9g, UACR 3523 mg/g). Of note, open for renal biopsy as well to further investigate proteinuria and hematuria.  Gean Quint, MD [facility]

## 2020-08-03 NOTE — ED Notes (Signed)
Dr. Zenia Resides notified of Trop 144.

## 2020-08-04 DIAGNOSIS — E119 Type 2 diabetes mellitus without complications: Secondary | ICD-10-CM

## 2020-08-04 DIAGNOSIS — I10 Essential (primary) hypertension: Secondary | ICD-10-CM

## 2020-08-04 DIAGNOSIS — R601 Generalized edema: Secondary | ICD-10-CM | POA: Diagnosis present

## 2020-08-04 DIAGNOSIS — R609 Edema, unspecified: Secondary | ICD-10-CM

## 2020-08-04 LAB — URINALYSIS, ROUTINE W REFLEX MICROSCOPIC
Glucose, UA: NEGATIVE mg/dL
Ketones, ur: NEGATIVE mg/dL
Leukocytes,Ua: NEGATIVE
Nitrite: NEGATIVE
Protein, ur: >300 mg/dL — AB
Specific Gravity, Urine: 1.025 (ref 1.005–1.030)
pH: 6 (ref 5.0–8.0)

## 2020-08-04 LAB — GLUCOSE, CAPILLARY
Glucose-Capillary: 188 mg/dL — ABNORMAL HIGH (ref 70–99)
Glucose-Capillary: 163 mg/dL — ABNORMAL HIGH (ref 70–99)
Glucose-Capillary: 248 mg/dL — ABNORMAL HIGH (ref 70–99)
Glucose-Capillary: 196 mg/dL — ABNORMAL HIGH (ref 70–99)

## 2020-08-04 LAB — RENAL FUNCTION PANEL
Albumin: 1.3 g/dL — ABNORMAL LOW (ref 3.5–5.0)
Anion gap: 14 (ref 5–15)
BUN: 11 mg/dL (ref 6–20)
CO2: 24 mmol/L (ref 22–32)
Calcium: 7.8 mg/dL — ABNORMAL LOW (ref 8.9–10.3)
Chloride: 101 mmol/L (ref 98–111)
Creatinine, Ser: 1.15 mg/dL (ref 0.61–1.24)
GFR calc Af Amer: >60 mL/min (ref >60)
GFR calc non Af Amer: >60 mL/min (ref >60)
Glucose, Bld: 179 mg/dL — ABNORMAL HIGH (ref 70–99)
Phosphorus: 5.2 mg/dL — ABNORMAL HIGH (ref 2.5–4.6)
Potassium: 3.7 mmol/L (ref 3.5–5.1)
Sodium: 139 mmol/L (ref 135–145)

## 2020-08-04 LAB — CBC
HCT: 38.4 % — ABNORMAL LOW (ref 39.0–52.0)
Hemoglobin: 12.7 g/dL — ABNORMAL LOW (ref 13.0–17.0)
MCH: 30.2 pg (ref 26.0–34.0)
MCHC: 33.1 g/dL (ref 30.0–36.0)
MCV: 91.2 fL (ref 80.0–100.0)
Platelets: 303 10*3/uL (ref 150–400)
RBC: 4.21 MIL/uL — ABNORMAL LOW (ref 4.22–5.81)
RDW: 15.3 % (ref 11.5–15.5)
WBC: 6.4 10*3/uL (ref 4.0–10.5)
nRBC: 0 % (ref 0.0–0.2)

## 2020-08-04 LAB — URINALYSIS, MICROSCOPIC (REFLEX)

## 2020-08-04 LAB — HIV ANTIBODY (ROUTINE TESTING W REFLEX): HIV Screen 4th Generation wRfx: NONREACTIVE

## 2020-08-04 LAB — HEPATITIS B SURFACE ANTIBODY,QUALITATIVE: Hep B S Ab: NONREACTIVE

## 2020-08-04 MED ORDER — METHYLPREDNISOLONE SODIUM SUCC 40 MG IJ SOLR
40.0000 mg | Freq: Two times a day (BID) | INTRAMUSCULAR | Status: DC
  Administered 2020-08-04 – 2020-08-05 (×2): 40 mg via INTRAVENOUS
  Filled 2020-08-04 (×2): qty 1

## 2020-08-04 MED ORDER — INSULIN ASPART 100 UNIT/ML ~~LOC~~ SOLN
0.0000 [IU] | Freq: Three times a day (TID) | SUBCUTANEOUS | Status: DC
  Administered 2020-08-04 – 2020-08-05 (×4): 3 [IU] via SUBCUTANEOUS
  Administered 2020-08-06 (×2): 2 [IU] via SUBCUTANEOUS
  Administered 2020-08-07: 3 [IU] via SUBCUTANEOUS
  Administered 2020-08-08: 1 [IU] via SUBCUTANEOUS

## 2020-08-04 MED ORDER — ENOXAPARIN SODIUM 40 MG/0.4ML ~~LOC~~ SOLN
40.0000 mg | SUBCUTANEOUS | Status: DC
  Administered 2020-08-04 – 2020-08-07 (×4): 40 mg via SUBCUTANEOUS
  Filled 2020-08-04 (×4): qty 0.4

## 2020-08-04 MED ORDER — INSULIN ASPART 100 UNIT/ML ~~LOC~~ SOLN: 0.0000 [IU] | Freq: Every day | SUBCUTANEOUS | Status: DC

## 2020-08-04 NOTE — Consult Note (Signed)
Chief Complaint: Patient was seen in consultation today for  Chief Complaint  Patient presents with  . Shortness of Breath  . Leg Swelling    Referring Physician(s): Dr. Marval Regal  Supervising Physician: Jacqulynn Cadet  Patient Status: [facility] - In-pt  History of Present Illness: Samuel Butler is a 59 y.o. male with a medical history significant for HTN, DM2, gout, IBS, PAD (s/p right SFA stenting 06/2020), polysubstance abuse and renal dysfunction for which he is followed by [facility]. He was sent to the ED by his nephrologist for worsening anasarca and shortness of breath. Patient also reported hematuria and foamy urine. Pertinent ED findings include: worsening renal function, significant proteinuria, hemoglobinuria and hypoalbuminemia.     Interventional Radiology has been asked to evaluate this patient for an image-guided random renal biopsy for further work up and evaluation.   Past Medical History:  Diagnosis Date  . DDD (degenerative disc disease), lumbar    per his report  . Diabetes mellitus without complication ([place])   . Gout 04/16/2013  . Hyperlipidemia   . Hypertension   . IBD (inflammatory bowel disease) 04/05/2016  . Tobacco use disorder 03/28/2015    Past Surgical History:  Procedure Laterality Date  . ABDOMINAL AORTOGRAM W/LOWER EXTREMITY Right 06/26/2020   Procedure: ABDOMINAL AORTOGRAM W/LOWER EXTREMITY;  Surgeon: Lorretta Harp, MD;  Location: [place] [facility];  Service: Cardiovascular;  Laterality: Right;  . COLONOSCOPY    . PERIPHERAL VASCULAR INTERVENTION  06/26/2020   Procedure: PERIPHERAL VASCULAR INTERVENTION;  Surgeon: Lorretta Harp, MD;  Location: [place] [facility];  Service: Cardiovascular;;  Right SFA  . TONSILLECTOMY      Allergies: Crestor [rosuvastatin calcium]  Medications: Prior to Admission medications   Medication Sig Start Date End Date Taking? Authorizing Provider  acetaminophen (TYLENOL) 650 MG CR  tablet Take 650 mg by mouth every 8 (eight) hours as needed for pain.   Yes [provider]  aspirin EC 81 MG EC tablet Take 1 tablet (81 mg total) by mouth daily. Swallow whole. 06/27/20  Yes Kathyrn Drown D, NP  atorvastatin (LIPITOR) 80 MG tablet Take 1 tablet (80 mg total) by mouth daily. 05/25/20  Yes Isaac Bliss, Rayford Halsted, MD  clopidogrel (PLAVIX) 75 MG tablet Take 1 tablet (75 mg total) by mouth daily with breakfast. 06/27/20  Yes Kathyrn Drown D, NP  isosorbide mononitrate (IMDUR) 30 MG 24 hr tablet Take 1 tablet (30 mg total) by mouth daily. 07/14/20  Yes Isaac Bliss, Rayford Halsted, MD  losartan (COZAAR) 50 MG tablet Take 1 tablet (50 mg total) by mouth daily. 07/25/20  Yes Lorretta Harp, MD  metFORMIN (GLUCOPHAGE) 500 MG tablet Take 1 tablet (500 mg total) by mouth daily with breakfast. 05/25/20  Yes Isaac Bliss, Rayford Halsted, MD  pantoprazole (PROTONIX) 40 MG tablet Take 1 tablet (40 mg total) by mouth daily. 07/14/20  Yes Isaac Bliss, Rayford Halsted, MD  torsemide (DEMADEX) 20 MG tablet Take 20 mg by mouth daily.   Yes [provider]  Vitamin D, Ergocalciferol, (DRISDOL) 1.25 MG (50000 UNIT) CAPS capsule Take 1 capsule (50,000 Units total) by mouth every 7 (seven) days for 12 doses. 05/26/20 08/12/20 Yes Erline Hau, MD  carvedilol (COREG) 6.25 MG tablet Take 1 tablet (6.25 mg total) by mouth 2 (two) times daily with a meal. Patient not taking: Reported on 08/03/2020 12/23/19   Isaac Bliss, Rayford Halsted, MD  FARXIGA 10 MG TABS tablet Take 10 mg  by mouth daily. 08/03/20   [provider]  furosemide (LASIX) 40 MG tablet Take 1 tablet (40 mg total) by mouth daily. Patient not taking: Reported on 08/03/2020 07/25/20 07/20/21  Lorretta Harp, MD  metoprolol tartrate (LOPRESSOR) 100 MG tablet Take 100 mg (1 tablet) TWO hours prior to CT Patient not taking: Reported on 08/03/2020 07/14/20   Lorretta Harp, MD     Family History  Problem Relation Age  of Onset  . Hypertension Father   . Diabetes Father   . Stroke Father     Social History   Socioeconomic History  . Marital status: Married    Spouse name: Not on file  . Number of children: Not on file  . Years of education: Not on file  . Highest education level: Not on file  Occupational History  . Not on file  Tobacco Use  . Smoking status: Former Smoker    Types: Cigarettes    Quit date: 10/23/2016    Years since quitting: 3.7  . Smokeless tobacco: Never Used  . Tobacco comment: per patient 5 cigarettes a day   Vaping Use  . Vaping Use: Never used  Substance and Sexual Activity  . Alcohol use: Yes    Comment: occasional  . Drug use: No  . Sexual activity: Not on file  Other Topics Concern  . 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 Difficulty of Paying Living Expenses: Not on file  Food Insecurity:   . Worried About Charity fundraiser in the Last Year: Not on file  . Ran Out of Food in the Last Year: Not on file  Transportation Needs:   . Lack of Transportation (Medical): Not on file  . Lack of Transportation (Non-Medical): Not on file  Physical Activity:   . Days of Exercise per Week: Not on file  . Minutes of Exercise per Session: Not on file  Stress:   . Feeling of Stress : Not on file  Social Connections:   . Frequency of Communication with Friends and Family: Not on file  . Frequency of Social Gatherings with Friends and Family: Not on file  . Attends Religious Services: Not on file  . Active Member of Clubs or Organizations: Not on file  . Attends Archivist Meetings: Not on file  . Marital Status: Not on file    Review of Systems: A 12 point ROS discussed and pertinent positives are  indicated in the HPI above.  All other systems are negative.  Review of Systems  Constitutional: Positive for fatigue. Negative for appetite change.  Respiratory: Positive for shortness of breath. Negative for cough.   Cardiovascular: Positive for leg swelling. Negative for chest pain.  Gastrointestinal: Negative for abdominal pain, diarrhea, nausea and vomiting.  Genitourinary: Negative for difficulty urinating and hematuria.  Musculoskeletal: Negative for back pain.  Neurological: Positive for headaches. Negative for light-headedness.    Vital Signs: BP 122/89 (BP Location: Left Arm)   Pulse 92   Temp 98.9 F (37.2 C) (Oral)   Resp 18   Ht 5\' 11"  (1.803 m)   Wt 238 lb 15.7  oz (108.4 kg)   SpO2 96%   BMI 33.33 kg/m   Physical Exam Constitutional:      General: He is not in acute distress. HENT:     Mouth/Throat:     Mouth: Mucous membranes are moist.     Pharynx: Oropharynx is clear.  Cardiovascular:     Rate and Rhythm: Normal rate and regular rhythm.     Pulses: Normal pulses.     Heart sounds: Normal heart sounds.  Pulmonary:     Effort: Pulmonary effort is normal.     Breath sounds: Normal breath sounds.  Abdominal:     General: Bowel sounds are normal.     Palpations: Abdomen is soft.  Musculoskeletal:        General: Normal range of motion.     Cervical back: Normal range of motion.     Right lower leg: Edema present.     Left lower leg: Edema present.  Skin:    General: Skin is warm and dry.  Neurological:     Mental Status: He is alert and oriented to person, place, and time.     Imaging: DG Chest 2 View  Result Date: 08/03/2020 CLINICAL DATA:  Shortness of breath, leg swelling EXAM: CHEST - 2 VIEW COMPARISON:  Chest x-ray 12/18/2001 report without images. FINDINGS: The heart size and mediastinal contours are within normal limits. Bibasilar linear atelectasis. No focal consolidation. No pulmonary edema. No pleural effusion. No pneumothorax. No acute  osseous abnormality. IMPRESSION: No active cardiopulmonary disease. Electronically Signed   By: Iven Finn M.D.   On: 08/03/2020 18:06   VAS [place] ABI WITH/WO TBI  Result Date: 07/18/2020 LOWER EXTREMITY DOPPLER STUDY Indications: Peripheral artery disease, and Patient complains of bilateral leg              swelling since procedure. He also has been complaining of right              groin pain and right calf pain. High Risk         Hypertension, hyperlipidemia, Diabetes, past history of Factors:          smoking. Other Factors: SEE RIGHT LEG ARTERIAL DUPLEX REPORT.  Vascular Interventions: 06/26/2020 Right proximal and mid SFA stents placed. Comparison Study: Prior ABI 02/28/2020 right .76 left .78 Performing Technologist: Salvadore Dom RVT  Examination Guidelines: A complete evaluation includes at minimum, Doppler waveform signals and systolic blood pressure reading at the level of bilateral brachial, anterior tibial, and posterior tibial arteries, when vessel segments are accessible. Bilateral testing is considered an integral part of a complete examination. Photoelectric Plethysmograph (PPG) waveforms and toe systolic pressure readings are included as required and additional duplex testing as needed. Limited examinations for reoccurring indications may be performed as noted.  ABI Findings: +---------+------------------+-----+---------+--------+ Right    Rt Pressure (mmHg)IndexWaveform Comment  +---------+------------------+-----+---------+--------+ Brachial 149                                      +---------+------------------+-----+---------+--------+ ATA      139               0.93 triphasic         +---------+------------------+-----+---------+--------+ PTA      132               0.89 triphasic         +---------+------------------+-----+---------+--------+ PERO     138  0.93 triphasic         +---------+------------------+-----+---------+--------+ Great Toe130                0.87 Normal            +---------+------------------+-----+---------+--------+ +---------+------------------+-----+-----------+-------+ Left     Lt Pressure (mmHg)IndexWaveform   Comment +---------+------------------+-----+-----------+-------+ Brachial 148                                       +---------+------------------+-----+-----------+-------+ ATA      144               0.97 multiphasic        +---------+------------------+-----+-----------+-------+ PTA      135               0.91 monophasic         +---------+------------------+-----+-----------+-------+ PERO     153               1.03 monophasic         +---------+------------------+-----+-----------+-------+ Great Toe104               0.70 Normal             +---------+------------------+-----+-----------+-------+ +-------+-----------+-----------+------------+------------+ ABI/TBIToday's ABIToday's TBIPrevious ABIPrevious TBI +-------+-----------+-----------+------------+------------+ Right  .93        .87        .76         .30          +-------+-----------+-----------+------------+------------+ Left   1.03       .70        .87         .58          +-------+-----------+-----------+------------+------------+  Right ABIs and TBIs appear increased compared to prior study on 02/28/2020. Left ABIs and TBIs appear increased compared to prior study on 02/28/2020.  Summary: Right: Resting right ankle-brachial index indicates mild right lower extremity arterial disease. The right toe-brachial index is normal. Left: The left toe-brachial index is normal. Although ankle brachial indices are within normal limits (0.95-1.29), arterial Doppler waveforms at the ankle suggest some component of arterial occlusive disease.  *See table(s) above for measurements and observations.  Suggest follow up study in 6 months. Electronically signed by Jenkins Rouge MD on 07/18/2020 at 12:30:22 PM.    Final    VAS [place]  LOWER EXTREMITY ARTERIAL DUPLEX  Result Date: 07/18/2020 LOWER EXTREMITY ARTERIAL DUPLEX STUDY Indications: Peripheral artery disease, and Patient complains of bilateral leg              swelling since procedure. He also has been complaining of right              groin pain and right calf pain. High Risk Factors: Hypertension, hyperlipidemia, Diabetes, past history of                    smoking. Other Factors: SEE ABI REPORT.  Vascular Interventions: 06/26/2020 Right proximal and mid SFA stents placed. Current ABI:            Right .93 Left 1.03 Comparison Study: Prior right leg arterial duplex exam on 02/28/2020 showed                   highest velocities in right mid SFA 347 cm/s and proximal                   popiteal  artery 100 cm/s. Performing Technologist: Salvadore Dom RVT, RDCS (AE), RDMS  Examination Guidelines: A complete evaluation includes B-mode imaging, spectral Doppler, color Doppler, and power Doppler as needed of all accessible portions of each vessel. Bilateral testing is considered an integral part of a complete examination. Limited examinations for reoccurring indications may be performed as noted.  +----------+--------+-----+---------------+---------+--------+ RIGHT     PSV cm/sRatioStenosis       Waveform Comments +----------+--------+-----+---------------+---------+--------+ CFA Prox  60                          triphasic         +----------+--------+-----+---------------+---------+--------+ CFA Distal65                          triphasic         +----------+--------+-----+---------------+---------+--------+ DFA       85                          triphasic         +----------+--------+-----+---------------+---------+--------+ SFA Prox  74                          triphasic         +----------+--------+-----+---------------+---------+--------+ SFA Mid   68                          triphasic          +----------+--------+-----+---------------+---------+--------+ SFA Distal98                          triphasic         +----------+--------+-----+---------------+---------+--------+ POP Prox  151          30-49% stenosistriphasic         +----------+--------+-----+---------------+---------+--------+ POP Distal59                          triphasic         +----------+--------+-----+---------------+---------+--------+ TP Trunk  55                          triphasic         +----------+--------+-----+---------------+---------+--------+ A focal velocity elevation of 151 cm/s was obtained at PRX POP ART with a VR of 1.5. Findings are characteristic of 30-49% stenosis.  Right Stent(s): +---------------+--------+--------+---------+--------+ PRX SFA        PSV cm/sStenosisWaveform Comments +---------------+--------+--------+---------+--------+ Prox to Stent  67              triphasic         +---------------+--------+--------+---------+--------+ Proximal Stent 55              triphasic         +---------------+--------+--------+---------+--------+ Mid Stent      72              triphasic         +---------------+--------+--------+---------+--------+ Distal Stent   83              triphasic         +---------------+--------+--------+---------+--------+ Distal to Stent82              triphasic         +---------------+--------+--------+---------+--------+ Patent stent   Left Stent(s): +---------------+--------+--------+---------+--------+ MID SFA  PSV cm/sStenosisWaveform Comments +---------------+--------+--------+---------+--------+ Prox to Stent  82              triphasic         +---------------+--------+--------+---------+--------+ Proximal Stent 85              triphasic         +---------------+--------+--------+---------+--------+ Mid Stent      91              triphasic          +---------------+--------+--------+---------+--------+ Distal Stent   89              triphasic         +---------------+--------+--------+---------+--------+ Distal to Stent92              triphasic         +---------------+--------+--------+---------+--------+ Patent stent   Summary: Right: 30-49% stenosis noted in the popliteal artery. Patent stents with no evidence of stenosis in the proximal and mid SFA artery.  See table(s) above for measurements and observations. Suggest follow up study in 6 months. Electronically signed by Jenkins Rouge MD on 07/18/2020 at 12:28:51 PM.    Final    VAS [place] LOWER EXTREMITY VENOUS (DVT) (ONLY [facility] & [facility] 7a-7p)  Result Date: 08/04/2020  Lower Venous DVTStudy Indications: Bilateral lower extremity pitting edema.  Comparison Study: No prior study Performing Technologist: Maudry Mayhew MHA, RDMS, RVT, RDCS  Examination Guidelines: A complete evaluation includes B-mode imaging, spectral Doppler, color Doppler, and power Doppler as needed of all accessible portions of each vessel. Bilateral testing is considered an integral part of a complete examination. Limited examinations for reoccurring indications may be performed as noted. The reflux portion of the exam is performed with the patient in reverse Trendelenburg.  +---------+---------------+---------+-----------+----------+--------------+ RIGHT    CompressibilityPhasicitySpontaneityPropertiesThrombus Aging +---------+---------------+---------+-----------+----------+--------------+ CFV      Full           Yes      Yes                                 +---------+---------------+---------+-----------+----------+--------------+ SFJ      Full           Yes      Yes                                 +---------+---------------+---------+-----------+----------+--------------+ FV Prox  Full           Yes      Yes                                  +---------+---------------+---------+-----------+----------+--------------+ FV Mid   Full           Yes      Yes                                 +---------+---------------+---------+-----------+----------+--------------+ FV DistalFull                                                        +---------+---------------+---------+-----------+----------+--------------+ PFV  Full                                                        +---------+---------------+---------+-----------+----------+--------------+ POP      Full                                                        +---------+---------------+---------+-----------+----------+--------------+ PTV      Full                                                        +---------+---------------+---------+-----------+----------+--------------+ PERO     Full                                                        +---------+---------------+---------+-----------+----------+--------------+   +---------+---------------+---------+-----------+----------+--------------+ LEFT     CompressibilityPhasicitySpontaneityPropertiesThrombus Aging +---------+---------------+---------+-----------+----------+--------------+ CFV      Full           Yes      Yes                                 +---------+---------------+---------+-----------+----------+--------------+ SFJ      Full                                                        +---------+---------------+---------+-----------+----------+--------------+ FV Prox  Full                                                        +---------+---------------+---------+-----------+----------+--------------+ FV Mid   Full                                                        +---------+---------------+---------+-----------+----------+--------------+ FV DistalFull                                                         +---------+---------------+---------+-----------+----------+--------------+ PFV      Full                                                        +---------+---------------+---------+-----------+----------+--------------+  POP      Full           Yes      Yes                                 +---------+---------------+---------+-----------+----------+--------------+ PTV      Full                                                        +---------+---------------+---------+-----------+----------+--------------+ PERO     Full                                                        +---------+---------------+---------+-----------+----------+--------------+     Summary: RIGHT: - There is no evidence of deep vein thrombosis in the lower extremity.  - No cystic structure found in the popliteal fossa.  LEFT: - There is no evidence of deep vein thrombosis in the lower extremity.  - No cystic structure found in the popliteal fossa.  *See table(s) above for measurements and observations. Electronically signed by Deitra Mayo MD on 08/04/2020 at 10:14:37 AM.    Final    UE VENOUS DUPLEX ([place] & [facility] 7 am - 7 pm)  Result Date: 08/04/2020 UPPER VENOUS STUDY  Indications: Pain, and edema x3 days. Limitations: Patient position; restricted mobility. Comparison Study: No prior study Performing Technologist: Maudry Mayhew MHA, RDMS, RVT, RDCS  Examination Guidelines: A complete evaluation includes B-mode imaging, spectral Doppler, color Doppler, and power Doppler as needed of all accessible portions of each vessel. Bilateral testing is considered an integral part of a complete examination. Limited examinations for reoccurring indications may be performed as noted.  Right Findings: +----------+------------+---------+-----------+----------+-------+ RIGHT     CompressiblePhasicitySpontaneousPropertiesSummary +----------+------------+---------+-----------+----------+-------+ IJV           Full        Yes       Yes                      +----------+------------+---------+-----------+----------+-------+ Subclavian    Full       Yes       Yes                      +----------+------------+---------+-----------+----------+-------+ Axillary      Full       Yes       Yes                      +----------+------------+---------+-----------+----------+-------+ Brachial      Full       Yes       Yes                      +----------+------------+---------+-----------+----------+-------+ Radial        Full                                          +----------+------------+---------+-----------+----------+-------+ Ulnar         Full                                          +----------+------------+---------+-----------+----------+-------+  Cephalic      Full                                          +----------+------------+---------+-----------+----------+-------+ Basilic       Full                                          +----------+------------+---------+-----------+----------+-------+  Left Findings: +----------+------------+---------+-----------+----------+--------------+ LEFT      CompressiblePhasicitySpontaneousProperties   Summary     +----------+------------+---------+-----------+----------+--------------+ Subclavian                                          Not visualized +----------+------------+---------+-----------+----------+--------------+  Summary:  Right: No evidence of deep vein thrombosis in the upper extremity. No evidence of superficial vein thrombosis in the upper extremity.  *See table(s) above for measurements and observations.  Diagnosing physician: Deitra Mayo MD Electronically signed by Deitra Mayo MD on 08/04/2020 at 10:14:49 AM.    Final     Labs:  CBC: Recent Labs    06/23/20 1034 06/27/20 0241 08/03/20 1618 08/04/20 0859  WBC 14.3* 9.2 8.2 6.4  HGB 14.2 12.7* 12.7* 12.7*  HCT 43.7 39.3 39.2 38.4*   PLT 351 194 268 303    COAGS: No results for input(s): INR, APTT in the last 8760 hours.  BMP: Recent Labs    06/23/20 1034 06/27/20 0241 08/03/20 1618 08/04/20 0859  NA 145* 138 141 139  K 3.6 3.2* 3.5 3.7  CL 104 100 103 101  CO2 30* 29 28 24   GLUCOSE 131* 114* 128* 179*  BUN 29* 27* 12 11  CALCIUM 8.3* 7.6* 7.8* 7.8*  CREATININE 1.14 1.08 1.27* 1.15  GFRNONAA 70 >60 >60 >60  GFRAA 80 >60 >60 >60    LIVER FUNCTION TESTS: Recent Labs    10/26/19 1506 12/23/19 0752 05/25/20 0849 08/04/20 0859  BILITOT 0.4 0.5 0.4  --   AST 15 15 15   --   ALT 13 15 11   --   ALKPHOS 111 113  --   --   PROT 5.4* 5.2* 4.5*  --   ALBUMIN 3.0* 2.8*  --  1.3*    TUMOR MARKERS: No results for input(s): AFPTM, CEA, CA199, CHROMGRNA in the last 8760 hours.  Assessment and Plan:  Proteinuria and possible nephrotic syndrome: Samuel Butler, 59 year old male, is tentatively scheduled to be seen at the [place] [facility] department 08/07/20 for an image-guided random renal biopsy.   Risks and benefits of a random renal biopsy were discussed with the patient and/or patient's family including, but not limited to bleeding, infection, damage to adjacent structures or low yield requiring additional tests.  All of the questions were answered and there is agreement to proceed.  The patient will be NPO after midnight on Monday 08/07/20. AM labs will be ordered for the day of the procedure.   Consent signed and in chart.  Thank you for this interesting consult.  I greatly enjoyed meeting Samuel Butler and look forward to participating in their care.  A copy of this report was sent to the requesting provider on this date.  Electronically Signed: Soyla Dryer, AGACNP-BC [phone] 08/04/2020, 1:12 PM  I spent a total of 20 Minutes    in face to face in clinical consultation, greater than 50% of which was counseling/coordinating care for image-guided random renal biopsy.

## 2020-08-04 NOTE — Progress Notes (Signed)
Progress Note    Samuel Butler  BHA:[id number] DOB: 20-Apr-1961  DOA: 08/03/2020 PCP: Isaac Bliss, Rayford Halsted, MD    Brief Narrative:     Medical records reviewed and are as summarized below:  Samuel Butler is an 59 y.o. male with a PMH significant for longstanding HTN, DM type 2 (20 years), PAD s/p right SFA stenting, HLD, h/o hepatitis (not sure which but did receive treatment), h/o polysubstance abuse, and HLD who was sent to [facility] ED with worsening lower extremity edema, orthopnea, and PND as well as worsening arthralgias of his wrists.  Assessment/Plan:   Active Problems:   Essential hypertension, benign   Hyperlipemia   Gout   Diet-controlled type 2 diabetes mellitus (HCC)   Tobacco use disorder   Hypoalbuminemia   Peripheral arterial disease (HCC)   Hypokalemia   AKI (acute kidney injury) ([place])   Anasarca   anasarca:  ? related to renal dsfn?  Patient also has cirrhosis and diastolic heart failure per prior.  No obvious CHF on chest x-ray.    Recheck echo -nephrology consult appreciated plan for 24-hour urine for protein, plan for biopsy Monday, hepatitis panel, HIV, SPEP, lipid panel -Continue IV diuretics  right elbow swelling and tenderness: Appears to be possibly due to gout versus migratory arthritis.   -IV steroids and monitor  Diabetes:  -Sliding scale insulin. -hold metformin -Monitor closely while on steroids  hypertension:  -monitor closely, avoid hypotension   tobacco use disorder:  -encouraged cessation  obesity Body mass index is 33.33 kg/m.   Family Communication/Anticipated D/C date and plan/Code Status   DVT prophylaxis: Lovenox ordered. Code Status: Full Code.  Disposition Plan: Status is: Inpatient  Remains inpatient appropriate because:Inpatient level of care appropriate due to severity of illness   Dispo: The patient is from: Home              Anticipated d/c is to: Home              Anticipated d/c date is: > 3  days              Patient currently is not medically stable to d/c.         Medical Consultants:   Nephrology   Subjective:   Thinks the swelling in his legs is better this morning  Objective:    Vitals:   08/03/20 2215 08/04/20 0225 08/04/20 0650 08/04/20 0839  BP: (!) 133/94 129/88 126/86 122/89  Pulse: (!) 103 98 97 92  Resp: 18 18 18 18   Temp: 99.5 F (37.5 C) 98.3 F (36.8 C) 98.2 F (36.8 C) 98.9 F (37.2 C)  TempSrc: Oral Oral  Oral  SpO2: 94% 98% 93% 96%  Weight: 108.4 kg     Height: 5\' 11"  (1.803 m)       Intake/Output Summary (Last 24 hours) at 08/04/2020 1455 Last data filed at 08/04/2020 1300 Gross per 24 hour  Intake 360 ml  Output 350 ml  Net 10 ml   Filed Weights   08/03/20 2215  Weight: 108.4 kg    Exam:  General: Appearance:    Obese male in no acute distress     Lungs:     respirations unlabored  Heart:    Normal heart rate. Normal rhythm. No murmurs, rubs, or gallops.   MS:   All extremities are intact. +pitting LE edema around ankles  Neurologic:   Awake, alert, oriented x 3. No apparent focal neurological  defect.     Data Reviewed:   I have personally reviewed following labs and imaging studies:  Labs: Labs show the following:   Basic Metabolic Panel: Recent Labs  Lab 08/03/20 1618 08/04/20 0859  NA 141 139  K 3.5 3.7  CL 103 101  CO2 28 24  GLUCOSE 128* 179*  BUN 12 11  CREATININE 1.27* 1.15  CALCIUM 7.8* 7.8*  PHOS  --  5.2*   GFR Estimated Creatinine Clearance: 85.5 mL/min (by C-G formula based on SCr of 1.15 mg/dL). Liver Function Tests: Recent Labs  Lab 08/04/20 0859  ALBUMIN 1.3*   No results for input(s): LIPASE, AMYLASE in the last 168 hours. No results for input(s): AMMONIA in the last 168 hours. Coagulation profile No results for input(s): INR, PROTIME in the last 168 hours.  CBC: Recent Labs  Lab 08/03/20 1618 08/04/20 0859  WBC 8.2 6.4  HGB 12.7* 12.7*  HCT 39.2 38.4*  MCV  95.1 91.2  PLT 268 303   Cardiac Enzymes: No results for input(s): CKTOTAL, CKMB, CKMBINDEX, TROPONINI in the last 168 hours. BNP (last 3 results) No results for input(s): PROBNP in the last 8760 hours. CBG: Recent Labs  Lab 08/03/20 1618 08/03/20 2216 08/04/20 0649 08/04/20 1115  GLUCAP 132* 105* 188* 163*   D-Dimer: No results for input(s): DDIMER in the last 72 hours. Hgb A1c: No results for input(s): HGBA1C in the last 72 hours. Lipid Profile: No results for input(s): CHOL, HDL, LDLCALC, TRIG, CHOLHDL, LDLDIRECT in the last 72 hours. Thyroid function studies: No results for input(s): TSH, T4TOTAL, T3FREE, THYROIDAB in the last 72 hours.  Invalid input(s): FREET3 Anemia work up: No results for input(s): VITAMINB12, FOLATE, FERRITIN, TIBC, IRON, RETICCTPCT in the last 72 hours. Sepsis Labs: Recent Labs  Lab 08/03/20 1618 08/04/20 0859  WBC 8.2 6.4    Microbiology Recent Results (from the past 240 hour(s))  Respiratory Panel by RT PCR (Flu A&B, Covid) - Nasopharyngeal Swab     Status: None   Collection Time: 08/03/20  6:10 PM   Specimen: Nasopharyngeal Swab  Result Value Ref Range Status   SARS Coronavirus 2 by RT PCR NEGATIVE NEGATIVE Final    Comment: (NOTE) SARS-CoV-2 target nucleic acids are NOT DETECTED.  The SARS-CoV-2 RNA is generally detectable in upper respiratoy specimens during the acute phase of infection. The lowest concentration of SARS-CoV-2 viral copies this assay can detect is 131 copies/mL. A negative result does not preclude SARS-Cov-2 infection and should not be used as the sole basis for treatment or other patient management decisions. A negative result may occur with  improper specimen collection/handling, submission of specimen other than nasopharyngeal swab, presence of viral mutation(s) within the areas targeted by this assay, and inadequate number of viral copies (<131 copies/mL). A negative result must be combined with  clinical observations, patient history, and epidemiological information. The expected result is Negative.  Fact Sheet for Patients:  PinkCheek.be  Fact Sheet for Healthcare Providers:  GravelBags.it  This test is no t yet approved or cleared by the [place] FDA and  has been authorized for detection and/or diagnosis of SARS-CoV-2 by FDA under an Emergency Use Authorization (EUA). This EUA will remain  in effect (meaning this test can be used) for the duration of the COVID-19 declaration under Section 564(b)(1) of the Act, 21 U.S.C. section 360bbb-3(b)(1), unless the authorization is terminated or revoked sooner.     Influenza A by PCR NEGATIVE NEGATIVE Final   Influenza B by  PCR NEGATIVE NEGATIVE Final    Comment: (NOTE) The Xpert Xpress SARS-CoV-2/FLU/RSV assay is intended as an aid in  the diagnosis of influenza from Nasopharyngeal swab specimens and  should not be used as a sole basis for treatment. Nasal washings and  aspirates are unacceptable for Xpert Xpress SARS-CoV-2/FLU/RSV  testing.  Fact Sheet for Patients: PinkCheek.be  Fact Sheet for Healthcare Providers: GravelBags.it  This test is not yet approved or cleared by the [place] FDA and  has been authorized for detection and/or diagnosis of SARS-CoV-2 by  FDA under an Emergency Use Authorization (EUA). This EUA will remain  in effect (meaning this test can be used) for the duration of the  Covid-19 declaration under Section 564(b)(1) of the Act, 21  U.S.C. section 360bbb-3(b)(1), unless the authorization is  terminated or revoked. Performed at [facility], [place] [address], [place], [place] [address]     Procedures and diagnostic studies:  DG Chest 2 View  Result Date: 08/03/2020 CLINICAL DATA:  Shortness of breath, leg swelling EXAM: CHEST - 2 VIEW COMPARISON:  Chest x-ray  12/18/2001 report without images. FINDINGS: The heart size and mediastinal contours are within normal limits. Bibasilar linear atelectasis. No focal consolidation. No pulmonary edema. No pleural effusion. No pneumothorax. No acute osseous abnormality. IMPRESSION: No active cardiopulmonary disease. Electronically Signed   By: Iven Finn M.D.   On: 08/03/2020 18:06   VAS [place] LOWER EXTREMITY VENOUS (DVT) (ONLY [facility] & [facility] 7a-7p)  Result Date: 08/04/2020  Lower Venous DVTStudy Indications: Bilateral lower extremity pitting edema.  Comparison Study: No prior study Performing Technologist: Maudry Mayhew MHA, RDMS, RVT, RDCS  Examination Guidelines: A complete evaluation includes B-mode imaging, spectral Doppler, color Doppler, and power Doppler as needed of all accessible portions of each vessel. Bilateral testing is considered an integral part of a complete examination. Limited examinations for reoccurring indications may be performed as noted. The reflux portion of the exam is performed with the patient in reverse Trendelenburg.  +---------+---------------+---------+-----------+----------+--------------+ RIGHT    CompressibilityPhasicitySpontaneityPropertiesThrombus Aging +---------+---------------+---------+-----------+----------+--------------+ CFV      Full           Yes      Yes                                 +---------+---------------+---------+-----------+----------+--------------+ SFJ      Full           Yes      Yes                                 +---------+---------------+---------+-----------+----------+--------------+ FV Prox  Full           Yes      Yes                                 +---------+---------------+---------+-----------+----------+--------------+ FV Mid   Full           Yes      Yes                                 +---------+---------------+---------+-----------+----------+--------------+ FV DistalFull                                                         +---------+---------------+---------+-----------+----------+--------------+  PFV      Full                                                        +---------+---------------+---------+-----------+----------+--------------+ POP      Full                                                        +---------+---------------+---------+-----------+----------+--------------+ PTV      Full                                                        +---------+---------------+---------+-----------+----------+--------------+ PERO     Full                                                        +---------+---------------+---------+-----------+----------+--------------+   +---------+---------------+---------+-----------+----------+--------------+ LEFT     CompressibilityPhasicitySpontaneityPropertiesThrombus Aging +---------+---------------+---------+-----------+----------+--------------+ CFV      Full           Yes      Yes                                 +---------+---------------+---------+-----------+----------+--------------+ SFJ      Full                                                        +---------+---------------+---------+-----------+----------+--------------+ FV Prox  Full                                                        +---------+---------------+---------+-----------+----------+--------------+ FV Mid   Full                                                        +---------+---------------+---------+-----------+----------+--------------+ FV DistalFull                                                        +---------+---------------+---------+-----------+----------+--------------+ PFV      Full                                                        +---------+---------------+---------+-----------+----------+--------------+  POP      Full           Yes      Yes                                  +---------+---------------+---------+-----------+----------+--------------+ PTV      Full                                                        +---------+---------------+---------+-----------+----------+--------------+ PERO     Full                                                        +---------+---------------+---------+-----------+----------+--------------+     Summary: RIGHT: - There is no evidence of deep vein thrombosis in the lower extremity.  - No cystic structure found in the popliteal fossa.  LEFT: - There is no evidence of deep vein thrombosis in the lower extremity.  - No cystic structure found in the popliteal fossa.  *See table(s) above for measurements and observations. Electronically signed by Deitra Mayo MD on 08/04/2020 at 10:14:37 AM.    Final    UE VENOUS DUPLEX ([place] & [facility] 7 am - 7 pm)  Result Date: 08/04/2020 UPPER VENOUS STUDY  Indications: Pain, and edema x3 days. Limitations: Patient position; restricted mobility. Comparison Study: No prior study Performing Technologist: Maudry Mayhew MHA, RDMS, RVT, RDCS  Examination Guidelines: A complete evaluation includes B-mode imaging, spectral Doppler, color Doppler, and power Doppler as needed of all accessible portions of each vessel. Bilateral testing is considered an integral part of a complete examination. Limited examinations for reoccurring indications may be performed as noted.  Right Findings: +----------+------------+---------+-----------+----------+-------+ RIGHT     CompressiblePhasicitySpontaneousPropertiesSummary +----------+------------+---------+-----------+----------+-------+ IJV           Full       Yes       Yes                      +----------+------------+---------+-----------+----------+-------+ Subclavian    Full       Yes       Yes                      +----------+------------+---------+-----------+----------+-------+ Axillary      Full       Yes       Yes                       +----------+------------+---------+-----------+----------+-------+ Brachial      Full       Yes       Yes                      +----------+------------+---------+-----------+----------+-------+ Radial        Full                                          +----------+------------+---------+-----------+----------+-------+ Ulnar         Full                                          +----------+------------+---------+-----------+----------+-------+  Cephalic      Full                                          +----------+------------+---------+-----------+----------+-------+ Basilic       Full                                          +----------+------------+---------+-----------+----------+-------+  Left Findings: +----------+------------+---------+-----------+----------+--------------+ LEFT      CompressiblePhasicitySpontaneousProperties   Summary     +----------+------------+---------+-----------+----------+--------------+ Subclavian                                          Not visualized +----------+------------+---------+-----------+----------+--------------+  Summary:  Right: No evidence of deep vein thrombosis in the upper extremity. No evidence of superficial vein thrombosis in the upper extremity.  *See table(s) above for measurements and observations.  Diagnosing physician: Deitra Mayo MD Electronically signed by Deitra Mayo MD on 08/04/2020 at 10:14:49 AM.    Final     Medications:   . atorvastatin  80 mg Oral Daily  . dapagliflozin propanediol  10 mg Oral Daily  . furosemide  40 mg Intravenous BID  . insulin aspart  0-5 Units Subcutaneous QHS  . insulin aspart  0-9 Units Subcutaneous TID WC  . isosorbide mononitrate  30 mg Oral Daily  . losartan  50 mg Oral Daily  . methylPREDNISolone (SOLU-MEDROL) injection  40 mg Intravenous Q6H  . pantoprazole  40 mg Oral Daily  . Vitamin D (Ergocalciferol)  50,000 Units Oral Q7 days    Continuous Infusions:   LOS: 1 day   Geradine Girt  [facility]   How to contact the [facility] Attending or Consulting provider [place] or covering provider during after hours [place], for this patient?  1. Check the care team in [facility] and look for a) attending/consulting [facility] provider listed and b) the [facility] team listed 2. Log into [email or web address] and use [place]'s universal password to access. If you do not have the password, please contact the hospital operator. 3. Locate the [facility] provider you are looking for under [facility] and page to a number that you can be directly reached. 4. If you still have difficulty reaching the provider, please page the [facility] (Director on Call) for the Hospitalists listed on amion for assistance.  08/04/2020, 2:55 PM

## 2020-08-04 NOTE — Plan of Care (Signed)
  Problem: Clinical Measurements: Goal: Diagnostic test results will improve Outcome: Completed/Met Goal: Respiratory complications will improve Outcome: Completed/Met Goal: Cardiovascular complication will be avoided Outcome: Completed/Met   Problem: Nutrition: Goal: Adequate nutrition will be maintained Outcome: Completed/Met

## 2020-08-04 NOTE — Plan of Care (Signed)
  Problem: Clinical Measurements: Goal: Diagnostic test results will improve Outcome: Progressing   

## 2020-08-04 NOTE — Consult Note (Signed)
Reason for Consult: Proteinuria and possible nephrotic syndrome Referring Physician: Eliseo Squires, DO  Samuel Butler is an 59 y.o. male with a PMH significant for longstanding HTN, DM type 2 (20 years), PAD s/p right SFA stenting, HLD, h/o hepatitis (not sure which but did receive treatment), h/o polysubstance abuse, and HLD who was sent to [facility] ED with worsening lower extremity edema, orthopnea, and PND as well as worsening arthralgias of his wrists.  He has been on and off prednisone by his PCP for these symptoms and reports that the edema improves with prednisone but worsened after stopping the last taper.  He admits to chronic NSAID use for years.  Reports hematuria, foamy urine, and lower ext edema which worsen throughout the day.  He was seen in our office on 08/03/20 after his lasix was increased, however given his worsening pulmonary symptoms he was directed to the ED for further evaluation.  Trend in Creatinine: Creatinine, Ser  Date/Time Value Ref Range Status  08/04/2020 08:59 AM 1.15 0.61 - 1.24 mg/dL Final  08/03/2020 04:18 PM 1.27 (H) 0.61 - 1.24 mg/dL Final  06/27/2020 02:41 AM 1.08 0.61 - 1.24 mg/dL Final  06/23/2020 10:34 AM 1.14 0.76 - 1.27 mg/dL Final  05/25/2020 08:49 AM 1.27 (H) 0.70 - 1.25 mg/dL Final  12/23/2019 07:52 AM 0.99 0.40 - 1.50 mg/dL Final  10/26/2019 03:06 PM 0.95 0.40 - 1.50 mg/dL Final  12/03/2018 09:48 AM 0.97 0.40 - 1.50 mg/dL Final  07/20/2018 09:19 AM 1.16 0.40 - 1.50 mg/dL Final  02/10/2018 04:09 PM 0.99 0.40 - 1.50 mg/dL Final  01/16/2017 08:36 AM 1.07 0.40 - 1.50 mg/dL Final  08/10/2015 09:49 AM 0.96 0.40 - 1.50 mg/dL Final  03/28/2015 04:48 PM 1.15 0.40 - 1.50 mg/dL Final  09/02/2014 09:08 AM 0.9 0.40 - 1.50 mg/dL Final  10/08/2013 08:22 AM 0.8 0.40 - 1.50 mg/dL Final  04/16/2013 10:27 AM 1.0 0.40 - 1.50 mg/dL Final  04/14/2013 08:26 PM 0.94 0.50 - 1.35 mg/dL Final  03/23/2009 06:35 PM 1.05 0.40 - 1.50 mg/dL Final    PMH:   Past Medical History:   Diagnosis Date  . DDD (degenerative disc disease), lumbar    per his report  . Diabetes mellitus without complication ([place])   . Gout 04/16/2013  . Hyperlipidemia   . Hypertension   . IBD (inflammatory bowel disease) 04/05/2016  . Tobacco use disorder 03/28/2015    PSH:   Past Surgical History:  Procedure Laterality Date  . ABDOMINAL AORTOGRAM W/LOWER EXTREMITY Right 06/26/2020   Procedure: ABDOMINAL AORTOGRAM W/LOWER EXTREMITY;  Surgeon: Lorretta Harp, MD;  Location: [place] [facility];  Service: Cardiovascular;  Laterality: Right;  . COLONOSCOPY    . PERIPHERAL VASCULAR INTERVENTION  06/26/2020   Procedure: PERIPHERAL VASCULAR INTERVENTION;  Surgeon: Lorretta Harp, MD;  Location: [place] [facility];  Service: Cardiovascular;;  Right SFA  . TONSILLECTOMY      Allergies:  Allergies  Allergen Reactions  . Crestor [Rosuvastatin Calcium] Other (See Comments)    Joint aches    Medications:   Prior to Admission medications   Medication Sig Start Date End Date Taking? Authorizing Provider  acetaminophen (TYLENOL) 650 MG CR tablet Take 650 mg by mouth every 8 (eight) hours as needed for pain.   Yes [provider]  aspirin EC 81 MG EC tablet Take 1 tablet (81 mg total) by mouth daily. Swallow whole. 06/27/20  Yes Kathyrn Drown D, NP  atorvastatin (LIPITOR) 80 MG tablet Take 1 tablet (80  mg total) by mouth daily. 05/25/20  Yes Isaac Bliss, Rayford Halsted, MD  clopidogrel (PLAVIX) 75 MG tablet Take 1 tablet (75 mg total) by mouth daily with breakfast. 06/27/20  Yes Kathyrn Drown D, NP  isosorbide mononitrate (IMDUR) 30 MG 24 hr tablet Take 1 tablet (30 mg total) by mouth daily. 07/14/20  Yes Isaac Bliss, Rayford Halsted, MD  losartan (COZAAR) 50 MG tablet Take 1 tablet (50 mg total) by mouth daily. 07/25/20  Yes Lorretta Harp, MD  metFORMIN (GLUCOPHAGE) 500 MG tablet Take 1 tablet (500 mg total) by mouth daily with breakfast. 05/25/20  Yes Isaac Bliss, Rayford Halsted, MD   pantoprazole (PROTONIX) 40 MG tablet Take 1 tablet (40 mg total) by mouth daily. 07/14/20  Yes Isaac Bliss, Rayford Halsted, MD  torsemide (DEMADEX) 20 MG tablet Take 20 mg by mouth daily.   Yes [provider]  Vitamin D, Ergocalciferol, (DRISDOL) 1.25 MG (50000 UNIT) CAPS capsule Take 1 capsule (50,000 Units total) by mouth every 7 (seven) days for 12 doses. 05/26/20 08/12/20 Yes Erline Hau, MD  carvedilol (COREG) 6.25 MG tablet Take 1 tablet (6.25 mg total) by mouth 2 (two) times daily with a meal. Patient not taking: Reported on 08/03/2020 12/23/19   Isaac Bliss, Rayford Halsted, MD  FARXIGA 10 MG TABS tablet Take 10 mg by mouth daily. 08/03/20   [provider]  furosemide (LASIX) 40 MG tablet Take 1 tablet (40 mg total) by mouth daily. Patient not taking: Reported on 08/03/2020 07/25/20 07/20/21  Lorretta Harp, MD  metoprolol tartrate (LOPRESSOR) 100 MG tablet Take 100 mg (1 tablet) TWO hours prior to CT Patient not taking: Reported on 08/03/2020 07/14/20   Lorretta Harp, MD    Inpatient medications: . atorvastatin  80 mg Oral Daily  . dapagliflozin propanediol  10 mg Oral Daily  . furosemide  40 mg Intravenous BID  . insulin aspart  0-5 Units Subcutaneous QHS  . insulin aspart  0-9 Units Subcutaneous TID WC  . isosorbide mononitrate  30 mg Oral Daily  . losartan  50 mg Oral Daily  . methylPREDNISolone (SOLU-MEDROL) injection  40 mg Intravenous Q6H  . pantoprazole  40 mg Oral Daily  . Vitamin D (Ergocalciferol)  50,000 Units Oral Q7 days    Discontinued Meds:   Medications Discontinued During This Encounter  Medication Reason  . acetaminophen (TYLENOL) CR tablet 166 mg Duplicate    Social History:  reports that he quit smoking about 3 years ago. His smoking use included cigarettes. He has never used smokeless tobacco. He reports current alcohol use. He reports that he does not use drugs.  Family History:   Family History  Problem Relation Age of  Onset  . Hypertension Father   . Diabetes Father   . Stroke Father     Pertinent items are noted in HPI. Weight change:   Intake/Output Summary (Last 24 hours) at 08/04/2020 1225 Last data filed at 08/04/2020 0630 Gross per 24 hour  Intake 240 ml  Output 350 ml  Net -110 ml   BP 122/89 (BP Location: Left Arm)   Pulse 92   Temp 98.9 F (37.2 C) (Oral)   Resp 18   Ht 5\' 11"  (1.803 m)   Wt 108.4 kg   SpO2 96%   BMI 33.33 kg/m  Vitals:   08/03/20 2215 08/04/20 0225 08/04/20 0650 08/04/20 0839  BP: (!) 133/94 129/88 126/86 122/89  Pulse: (!) 103 98 97 92  Resp: 18  18 18 18   Temp: 99.5 F (37.5 C) 98.3 F (36.8 C) 98.2 F (36.8 C) 98.9 F (37.2 C)  TempSrc: Oral Oral  Oral  SpO2: 94% 98% 93% 96%  Weight: 108.4 kg     Height: 5\' 11"  (1.803 m)        General appearance: alert, cooperative and no distress Head: Normocephalic, without obvious abnormality, atraumatic Eyes: negative findings: lids and lashes normal, conjunctivae and sclerae normal and corneas clear Resp: clear to auscultation bilaterally Cardio: regular rate and rhythm, S1, S2 normal, no murmur, click, rub or gallop GI: soft, non-tender; bowel sounds normal; no masses,  no organomegaly Extremities: edema 1+  Labs: Basic Metabolic Panel: Recent Labs  Lab 08/03/20 1618 08/04/20 0859  NA 141 139  K 3.5 3.7  CL 103 101  CO2 28 24  GLUCOSE 128* 179*  BUN 12 11  CREATININE 1.27* 1.15  ALBUMIN  --  1.3*  CALCIUM 7.8* 7.8*  PHOS  --  5.2*   Liver Function Tests: Recent Labs  Lab 08/04/20 0859  ALBUMIN 1.3*   No results for input(s): LIPASE, AMYLASE in the last 168 hours. No results for input(s): AMMONIA in the last 168 hours. CBC: Recent Labs  Lab 08/03/20 1618 08/04/20 0859  WBC 8.2 6.4  HGB 12.7* 12.7*  HCT 39.2 38.4*  MCV 95.1 91.2  PLT 268 303   PT/INR: @LABRCNTIP (inr:5) Cardiac Enzymes: )No results for input(s): CKTOTAL, CKMB, CKMBINDEX, TROPONINI in the last 168  hours. CBG: Recent Labs  Lab 08/03/20 1618 08/03/20 2216 08/04/20 0649 08/04/20 1115  GLUCAP 132* 105* 188* 163*    Iron Studies: No results for input(s): IRON, TIBC, TRANSFERRIN, FERRITIN in the last 168 hours.  Xrays/Other Studies: DG Chest 2 View  Result Date: 08/03/2020 CLINICAL DATA:  Shortness of breath, leg swelling EXAM: CHEST - 2 VIEW COMPARISON:  Chest x-ray 12/18/2001 report without images. FINDINGS: The heart size and mediastinal contours are within normal limits. Bibasilar linear atelectasis. No focal consolidation. No pulmonary edema. No pleural effusion. No pneumothorax. No acute osseous abnormality. IMPRESSION: No active cardiopulmonary disease. Electronically Signed   By: Iven Finn M.D.   On: 08/03/2020 18:06   VAS [place] LOWER EXTREMITY VENOUS (DVT) (ONLY [facility] & [facility] 7a-7p)  Result Date: 08/04/2020  Lower Venous DVTStudy Indications: Bilateral lower extremity pitting edema.  Comparison Study: No prior study Performing Technologist: Maudry Mayhew MHA, RDMS, RVT, RDCS  Examination Guidelines: A complete evaluation includes B-mode imaging, spectral Doppler, color Doppler, and power Doppler as needed of all accessible portions of each vessel. Bilateral testing is considered an integral part of a complete examination. Limited examinations for reoccurring indications may be performed as noted. The reflux portion of the exam is performed with the patient in reverse Trendelenburg.  +---------+---------------+---------+-----------+----------+--------------+ RIGHT    CompressibilityPhasicitySpontaneityPropertiesThrombus Aging +---------+---------------+---------+-----------+----------+--------------+ CFV      Full           Yes      Yes                                 +---------+---------------+---------+-----------+----------+--------------+ SFJ      Full           Yes      Yes                                  +---------+---------------+---------+-----------+----------+--------------+ FV Prox  Full           Yes      Yes                                 +---------+---------------+---------+-----------+----------+--------------+ FV Mid   Full           Yes      Yes                                 +---------+---------------+---------+-----------+----------+--------------+ FV DistalFull                                                        +---------+---------------+---------+-----------+----------+--------------+ PFV      Full                                                        +---------+---------------+---------+-----------+----------+--------------+ POP      Full                                                        +---------+---------------+---------+-----------+----------+--------------+ PTV      Full                                                        +---------+---------------+---------+-----------+----------+--------------+ PERO     Full                                                        +---------+---------------+---------+-----------+----------+--------------+   +---------+---------------+---------+-----------+----------+--------------+ LEFT     CompressibilityPhasicitySpontaneityPropertiesThrombus Aging +---------+---------------+---------+-----------+----------+--------------+ CFV      Full           Yes      Yes                                 +---------+---------------+---------+-----------+----------+--------------+ SFJ      Full                                                        +---------+---------------+---------+-----------+----------+--------------+ FV Prox  Full                                                        +---------+---------------+---------+-----------+----------+--------------+  FV Mid   Full                                                         +---------+---------------+---------+-----------+----------+--------------+ FV DistalFull                                                        +---------+---------------+---------+-----------+----------+--------------+ PFV      Full                                                        +---------+---------------+---------+-----------+----------+--------------+ POP      Full           Yes      Yes                                 +---------+---------------+---------+-----------+----------+--------------+ PTV      Full                                                        +---------+---------------+---------+-----------+----------+--------------+ PERO     Full                                                        +---------+---------------+---------+-----------+----------+--------------+     Summary: RIGHT: - There is no evidence of deep vein thrombosis in the lower extremity.  - No cystic structure found in the popliteal fossa.  LEFT: - There is no evidence of deep vein thrombosis in the lower extremity.  - No cystic structure found in the popliteal fossa.  *See table(s) above for measurements and observations. Electronically signed by Deitra Mayo MD on 08/04/2020 at 10:14:37 AM.    Final    UE VENOUS DUPLEX ([place] & [facility] 7 am - 7 pm)  Result Date: 08/04/2020 UPPER VENOUS STUDY  Indications: Pain, and edema x3 days. Limitations: Patient position; restricted mobility. Comparison Study: No prior study Performing Technologist: Maudry Mayhew MHA, RDMS, RVT, RDCS  Examination Guidelines: A complete evaluation includes B-mode imaging, spectral Doppler, color Doppler, and power Doppler as needed of all accessible portions of each vessel. Bilateral testing is considered an integral part of a complete examination. Limited examinations for reoccurring indications may be performed as noted.  Right Findings: +----------+------------+---------+-----------+----------+-------+  RIGHT     CompressiblePhasicitySpontaneousPropertiesSummary +----------+------------+---------+-----------+----------+-------+ IJV           Full       Yes       Yes                      +----------+------------+---------+-----------+----------+-------+ Subclavian    Full  Yes       Yes                      +----------+------------+---------+-----------+----------+-------+ Axillary      Full       Yes       Yes                      +----------+------------+---------+-----------+----------+-------+ Brachial      Full       Yes       Yes                      +----------+------------+---------+-----------+----------+-------+ Radial        Full                                          +----------+------------+---------+-----------+----------+-------+ Ulnar         Full                                          +----------+------------+---------+-----------+----------+-------+ Cephalic      Full                                          +----------+------------+---------+-----------+----------+-------+ Basilic       Full                                          +----------+------------+---------+-----------+----------+-------+  Left Findings: +----------+------------+---------+-----------+----------+--------------+ LEFT      CompressiblePhasicitySpontaneousProperties   Summary     +----------+------------+---------+-----------+----------+--------------+ Subclavian                                          Not visualized +----------+------------+---------+-----------+----------+--------------+  Summary:  Right: No evidence of deep vein thrombosis in the upper extremity. No evidence of superficial vein thrombosis in the upper extremity.  *See table(s) above for measurements and observations.  Diagnosing physician: Deitra Mayo MD Electronically signed by Deitra Mayo MD on 08/04/2020 at 10:14:49 AM.    Final       Assessment/Plan: 1. Anasarca-pt with worsening edema of lower extremities and hypoalbuminemia.  Possible Nephrotic syndrome but he also has a history of diastolic dysfunction (on ECHO 4/21), as well as liver disease.  He has longstanding DM which can sometimes present with nephrotic syndromebut also has significant proteinuria and hypoalbuminemia possible nephrotic syndrome. 1. Will order 24 hour urine for protein along with serologies given blood and protein on UA 2. Discussed renal biopsy and will consult IR for biopsy on Monday if he is still here or as an outpatient if he is discharged over the weekend 3. He is already on ARB and diuretics 4. Check hepatitis panels, HIV, SPEP/UPEP, lipid panel. 5. Continue with IV diuresis. 2. SOB and DOE- CXR without pulmonary edema.  Unclear etiology.  Consider repeating ECHO. 3. Arthralgias- possible gout vs migratory arthritis.  Will check serologies  4. DM- per primary 5. HTN- stable 6. CKD stage 2- presumably due  to DM and HTN.  Started on Farxiga recently by Dr. Candiss Norse.   Governor Rooks Kasch Borquez 08/04/2020, 12:25 PM

## 2020-08-05 ENCOUNTER — Inpatient Hospital Stay (HOSPITAL_COMMUNITY): Payer: BC Managed Care – PPO

## 2020-08-05 DIAGNOSIS — I361 Nonrheumatic tricuspid (valve) insufficiency: Secondary | ICD-10-CM

## 2020-08-05 DIAGNOSIS — F17200 Nicotine dependence, unspecified, uncomplicated: Secondary | ICD-10-CM

## 2020-08-05 DIAGNOSIS — I351 Nonrheumatic aortic (valve) insufficiency: Secondary | ICD-10-CM

## 2020-08-05 DIAGNOSIS — E8809 Other disorders of plasma-protein metabolism, not elsewhere classified: Secondary | ICD-10-CM

## 2020-08-05 LAB — RENAL FUNCTION PANEL
Albumin: 1.1 g/dL — ABNORMAL LOW (ref 3.5–5.0)
Anion gap: 13 (ref 5–15)
BUN: 28 mg/dL — ABNORMAL HIGH (ref 6–20)
CO2: 25 mmol/L (ref 22–32)
Calcium: 7.7 mg/dL — ABNORMAL LOW (ref 8.9–10.3)
Chloride: 101 mmol/L (ref 98–111)
Creatinine, Ser: 1.34 mg/dL — ABNORMAL HIGH (ref 0.61–1.24)
GFR calc Af Amer: >60 mL/min (ref >60)
GFR calc non Af Amer: 57 mL/min — ABNORMAL LOW (ref >60)
Glucose, Bld: 266 mg/dL — ABNORMAL HIGH (ref 70–99)
Phosphorus: 4.2 mg/dL (ref 2.5–4.6)
Potassium: 4 mmol/L (ref 3.5–5.1)
Sodium: 139 mmol/L (ref 135–145)

## 2020-08-05 LAB — CBC
HCT: 36.3 % — ABNORMAL LOW (ref 39.0–52.0)
Hemoglobin: 12.1 g/dL — ABNORMAL LOW (ref 13.0–17.0)
MCH: 30.6 pg (ref 26.0–34.0)
MCHC: 33.3 g/dL (ref 30.0–36.0)
MCV: 91.7 fL (ref 80.0–100.0)
Platelets: 360 10*3/uL (ref 150–400)
RBC: 3.96 MIL/uL — ABNORMAL LOW (ref 4.22–5.81)
RDW: 15.2 % (ref 11.5–15.5)
WBC: 14.8 10*3/uL — ABNORMAL HIGH (ref 4.0–10.5)
nRBC: 0 % (ref 0.0–0.2)

## 2020-08-05 LAB — ECHOCARDIOGRAM COMPLETE
Area-P 1/2: 5.38 cm2
Height: 71 in
P 1/2 time: 440 msec
S' Lateral: 2.7 cm
Weight: 3925.95 oz

## 2020-08-05 LAB — MPO/PR-3 (ANCA) ANTIBODIES
ANCA Proteinase 3: 14.6 U/mL — ABNORMAL HIGH (ref 0.0–3.5)
Myeloperoxidase Abs: <9 U/mL (ref 0.0–9.0)

## 2020-08-05 LAB — GLUCOSE, CAPILLARY
Glucose-Capillary: 223 mg/dL — ABNORMAL HIGH (ref 70–99)
Glucose-Capillary: 209 mg/dL — ABNORMAL HIGH (ref 70–99)
Glucose-Capillary: 195 mg/dL — ABNORMAL HIGH (ref 70–99)

## 2020-08-05 LAB — ANTISTREPTOLYSIN O TITER: ASO: <20 IU/mL (ref 0.0–200.0)

## 2020-08-05 LAB — PTH, INTACT AND CALCIUM
Calcium, Total (PTH): 7.6 mg/dL — ABNORMAL LOW (ref 8.6–10.2)
PTH: 75 pg/mL — ABNORMAL HIGH (ref 15–65)

## 2020-08-05 LAB — URIC ACID: Uric Acid, Serum: 10.1 mg/dL — ABNORMAL HIGH (ref 3.7–8.6)

## 2020-08-05 LAB — C4 COMPLEMENT: Complement C4, Body Fluid: 42 mg/dL — ABNORMAL HIGH (ref 12–38)

## 2020-08-05 LAB — COMPLEMENT, TOTAL: Compl, Total (CH50): >60 U/mL (ref >41)

## 2020-08-05 LAB — ANTI-DNA ANTIBODY, DOUBLE-STRANDED: ds DNA Ab: 1 IU/mL (ref 0–9)

## 2020-08-05 LAB — C3 COMPLEMENT: C3 Complement: 192 mg/dL — ABNORMAL HIGH (ref 82–167)

## 2020-08-05 MED ORDER — PREDNISONE 20 MG PO TABS
40.0000 mg | ORAL_TABLET | Freq: Every day | ORAL | Status: DC
  Administered 2020-08-06 – 2020-08-08 (×3): 40 mg via ORAL
  Filled 2020-08-05 (×3): qty 2

## 2020-08-05 NOTE — Progress Notes (Signed)
  Echocardiogram 2D Echocardiogram has been performed.  Samuel Butler 08/05/2020, 11:44 AM

## 2020-08-05 NOTE — Progress Notes (Signed)
Progress Note    Daric A Hallinan  YBW:[id number] DOB: [birth year]  DOA: 08/03/2020 PCP: Isaac Bliss, Rayford Halsted, MD    Brief Narrative:     Medical records reviewed and are as summarized below:  Andras A Recht is an 59 y.o. male with a PMH significant for longstanding HTN, DM type 2 (20 years), PAD s/p right SFA stenting, HLD, h/o hepatitis (not sure which but did receive treatment), h/o polysubstance abuse, and HLD who was sent to [facility] ED with worsening lower extremity edema, orthopnea, and PND as well as worsening arthralgias of his wrists.  Assessment/Plan:   Active Problems:   Essential hypertension, benign   Hyperlipemia   Gout   Diet-controlled type 2 diabetes mellitus (HCC)   Tobacco use disorder   Hypoalbuminemia   Peripheral arterial disease (HCC)   Hypokalemia   AKI (acute kidney injury) ([place])   Anasarca   anasarca:  ? related to renal dsfn?  Patient also has cirrhosis and diastolic heart failure per prior.  No obvious CHF on chest x-ray.     -nephrology consult appreciated plan for 24-hour urine for protein, plan for biopsy Monday, hepatitis panel, HIV, SPEP, lipid panel -Continue IV diuretics -echo shows worsening LVH: ? From volume overload?  right elbow swelling and tenderness: Appears to be possibly due to gout versus migratory arthritis.   -IV steroids- wean off  Diabetes:  -Sliding scale insulin. -hold metformin -Monitor closely while on steroids  hypertension:  -monitor closely, avoid hypotension   tobacco use disorder:  -encouraged cessation  obesity Body mass index is 34.22 kg/m.   Family Communication/Anticipated D/C date and plan/Code Status   DVT prophylaxis: Lovenox ordered. Code Status: Full Code.  Disposition Plan: Status is: Inpatient  Remains inpatient appropriate because:Inpatient level of care appropriate due to severity of illness   Dispo: The patient is from: Home              Anticipated d/c is to: Home               Anticipated d/c date is: > 3 days              Patient currently is not medically stable to d/c.         Medical Consultants:   Nephrology   Subjective:   Hand swelling better  Objective:    Vitals:   08/04/20 1618 08/04/20 2045 08/05/20 0428   BP: 122/81 113/83 112/83   Pulse: 99 88 86   Resp: 18 18 16    Temp: (!) 97.5 F (36.4 C) 98.3 F (36.8 C) 97.7 F (36.5 C)   TempSrc: Oral Oral Oral   SpO2: 97% 97% 99%   Weight:  111.3 kg    Height:        Intake/Output Summary (Last 24 hours) at 08/05/2020 1520 Last data filed at 08/05/2020 1300 Gross per 24 hour  Intake 1440 ml  Output 1326 ml  Net 114 ml   Filed Weights   08/03/20 2215 08/04/20 2045  Weight: 108.4 kg 111.3 kg    Exam:  General: Appearance:    Obese male in no acute distress     Lungs:      respirations unlabored  Heart:    Normal heart rate. Normal rhythm. No murmurs, rubs, or gallops.   MS:   All extremities are intact.  Improved LE Edema  Neurologic:   Awake, alert, oriented x 3. No apparent focal neurological  defect.      Data Reviewed:   I have personally reviewed following labs and imaging studies:  Labs: Labs show the following:   Basic Metabolic Panel: Recent Labs  Lab 08/03/20 1618 08/03/20 1618 08/04/20 0859 08/05/20 0809  NA 141  --  139 139  K 3.5   < > 3.7 4.0  CL 103  --  101 101  CO2 28  --  24 25  GLUCOSE 128*  --  179* 266*  BUN 12  --  11 28*  CREATININE 1.27*  --  1.15 1.34*  CALCIUM 7.8*  --  7.8*  7.6* 7.7*  PHOS  --   --  5.2* 4.2   < > = values in this interval not displayed.   GFR Estimated Creatinine Clearance: 74.4 mL/min (A) (by C-G formula based on SCr of 1.34 mg/dL (H)). Liver Function Tests: Recent Labs  Lab 08/04/20 0859 08/05/20 0809  ALBUMIN 1.3* 1.1*   No results for input(s): LIPASE, AMYLASE in the last 168 hours. No results for input(s): AMMONIA in the last 168 hours. Coagulation profile No results for input(s):  INR, PROTIME in the last 168 hours.  CBC: Recent Labs  Lab 08/03/20 1618 08/04/20 0859 08/05/20 0809  WBC 8.2 6.4 14.8*  HGB 12.7* 12.7* 12.1*  HCT 39.2 38.4* 36.3*  MCV 95.1 91.2 91.7  PLT 268 303 360   Cardiac Enzymes: No results for input(s): CKTOTAL, CKMB, CKMBINDEX, TROPONINI in the last 168 hours. BNP (last 3 results) No results for input(s): PROBNP in the last 8760 hours. CBG: Recent Labs  Lab 08/04/20 0649 08/04/20 1115 08/04/20 1614 08/04/20 2047 08/05/20 0635  GLUCAP 188* 163* 248* 196* 223*   D-Dimer: No results for input(s): DDIMER in the last 72 hours. Hgb A1c: No results for input(s): HGBA1C in the last 72 hours. Lipid Profile: No results for input(s): CHOL, HDL, LDLCALC, TRIG, CHOLHDL, LDLDIRECT in the last 72 hours. Thyroid function studies: No results for input(s): TSH, T4TOTAL, T3FREE, THYROIDAB in the last 72 hours.  Invalid input(s): FREET3 Anemia work up: No results for input(s): VITAMINB12, FOLATE, FERRITIN, TIBC, IRON, RETICCTPCT in the last 72 hours. Sepsis Labs: Recent Labs  Lab 08/03/20 1618 08/04/20 0859 08/05/20 0809  WBC 8.2 6.4 14.8*    Microbiology Recent Results (from the past 240 hour(s))  Respiratory Panel by RT PCR (Flu A&B, Covid) - Nasopharyngeal Swab     Status: None   Collection Time: 08/03/20  6:10 PM   Specimen: Nasopharyngeal Swab  Result Value Ref Range Status   SARS Coronavirus 2 by RT PCR NEGATIVE NEGATIVE Final    Comment: (NOTE) SARS-CoV-2 target nucleic acids are NOT DETECTED.  The SARS-CoV-2 RNA is generally detectable in upper respiratoy specimens during the acute phase of infection. The lowest concentration of SARS-CoV-2 viral copies this assay can detect is 131 copies/mL. A negative result does not preclude SARS-Cov-2 infection and should not be used as the sole basis for treatment or other patient management decisions. A negative result may occur with  improper specimen collection/handling,  submission of specimen other than nasopharyngeal swab, presence of viral mutation(s) within the areas targeted by this assay, and inadequate number of viral copies (<131 copies/mL). A negative result must be combined with clinical observations, patient history, and epidemiological information. The expected result is Negative.  Fact Sheet for Patients:  PinkCheek.be  Fact Sheet for Healthcare Providers:  GravelBags.it  This test is no t yet approved or cleared by the [place] FDA  and  has been authorized for detection and/or diagnosis of SARS-CoV-2 by FDA under an Emergency Use Authorization (EUA). This EUA will remain  in effect (meaning this test can be used) for the duration of the COVID-19 declaration under Section 564(b)(1) of the Act, 21 U.S.C. section 360bbb-3(b)(1), unless the authorization is terminated or revoked sooner.     Influenza A by PCR NEGATIVE NEGATIVE Final   Influenza B by PCR NEGATIVE NEGATIVE Final    Comment: (NOTE) The Xpert Xpress SARS-CoV-2/FLU/RSV assay is intended as an aid in  the diagnosis of influenza from Nasopharyngeal swab specimens and  should not be used as a sole basis for treatment. Nasal washings and  aspirates are unacceptable for Xpert Xpress SARS-CoV-2/FLU/RSV  testing.  Fact Sheet for Patients: PinkCheek.be  Fact Sheet for Healthcare Providers: GravelBags.it  This test is not yet approved or cleared by the [place] FDA and  has been authorized for detection and/or diagnosis of SARS-CoV-2 by  FDA under an Emergency Use Authorization (EUA). This EUA will remain  in effect (meaning this test can be used) for the duration of the  Covid-19 declaration under Section 564(b)(1) of the Act, 21  U.S.C. section 360bbb-3(b)(1), unless the authorization is  terminated or revoked. Performed at [facility], [place] [address], [place], [place] [address]     Procedures and diagnostic studies:  DG Chest 2 View  Result Date: 08/03/2020 CLINICAL DATA:  Shortness of breath, leg swelling EXAM: CHEST - 2 VIEW COMPARISON:  Chest x-ray 12/18/2001 report without images. FINDINGS: The heart size and mediastinal contours are within normal limits. Bibasilar linear atelectasis. No focal consolidation. No pulmonary edema. No pleural effusion. No pneumothorax. No acute osseous abnormality. IMPRESSION: No active cardiopulmonary disease. Electronically Signed   By: Iven Finn M.D.   On: 08/03/2020 18:06   ECHOCARDIOGRAM COMPLETE  Result Date: 08/05/2020    ECHOCARDIOGRAM REPORT   Patient Name:   Square A Avino Date of Exam: 08/05/2020 Medical Rec #:  [id number]       Height:       71.0 in Accession #:    [id number]      Weight:       245.4 lb Date of Birth:  August 02, 1961       BSA:          2.300 m Patient Age:    42 years        BP:           115/87 mmHg Patient Gender: M               HR:           94 bpm. Exam Location:  Inpatient Procedure: 2D Echo, Cardiac Doppler and Color Doppler Indications:    Dyspnea  History:        Patient has prior history of Echocardiogram examinations, most                 recent 03/03/2020. PAD, Signs/Symptoms:Shortness of Breath and LE                 edema; Risk Factors:Hypertension, Dyslipidemia, Diabetes and                 Current Smoker.  Sonographer:    Dustin Flock Referring Phys: [employer]  1. Left ventricular ejection fraction, by estimation, is 65 to 70%. The left ventricle has normal function. The left ventricle has no regional wall motion abnormalities. There  is severe concentric left ventricular hypertrophy. Left ventricular diastolic  parameters are consistent with Grade I diastolic dysfunction (impaired relaxation). Elevated left ventricular end-diastolic pressure.  2. Right ventricular systolic function is normal. The right ventricular size is normal. There is  moderately elevated pulmonary artery systolic pressure.  3. Left atrial size was mildly dilated.  4. The mitral valve is grossly normal. Trivial mitral valve regurgitation.  5. The aortic valve is tricuspid. Aortic valve regurgitation is mild. Aortic regurgitation PHT measures 440 msec.  6. Aortic dilatation noted. There is mild dilatation of the ascending aorta, measuring 40 mm.  7. The inferior vena cava is dilated in size with <50% respiratory variability, suggesting right atrial pressure of 15 mmHg. Comparison(s): Changes from prior study are noted. 03/03/20: LVEF 65-70%, grade 1 DD, elevated LA pressue, RVSP 33 mmHg. FINDINGS  Left Ventricle: Left ventricular ejection fraction, by estimation, is 65 to 70%. The left ventricle has normal function. The left ventricle has no regional wall motion abnormalities. The left ventricular internal cavity size was normal in size. There is  severe concentric left ventricular hypertrophy. Left ventricular diastolic parameters are consistent with Grade I diastolic dysfunction (impaired relaxation). Elevated left ventricular end-diastolic pressure. Right Ventricle: The right ventricular size is normal. No increase in right ventricular wall thickness. Right ventricular systolic function is normal. There is moderately elevated pulmonary artery systolic pressure. The tricuspid regurgitant velocity is 2.76 m/s, and with an assumed right atrial pressure of 15 mmHg, the estimated right ventricular systolic pressure is 13.2 mmHg. Left Atrium: Left atrial size was mildly dilated. Right Atrium: Right atrial size was normal in size. Pericardium: There is no evidence of pericardial effusion. Mitral Valve: The mitral valve is grossly normal. Trivial mitral valve regurgitation. Tricuspid Valve: The tricuspid valve is grossly normal. Tricuspid valve regurgitation is mild. Aortic Valve: The aortic valve is tricuspid. Aortic valve regurgitation is mild. Aortic regurgitation PHT measures 440  msec. Pulmonic Valve: The pulmonic valve was normal in structure. Pulmonic valve regurgitation is not visualized. Aorta: Aortic dilatation noted. There is mild dilatation of the ascending aorta, measuring 40 mm. Venous: The inferior vena cava is dilated in size with less than 50% respiratory variability, suggesting right atrial pressure of 15 mmHg. IAS/Shunts: No atrial level shunt detected by color flow Doppler.  LEFT VENTRICLE PLAX 2D LVIDd:         3.50 cm  Diastology LVIDs:         2.70 cm  LV e' medial:    4.35 cm/s LV PW:         1.70 cm  LV E/e' medial:  21.5 LV IVS:        1.70 cm  LV e' lateral:   5.22 cm/s LVOT diam:     2.50 cm  LV E/e' lateral: 17.9 LV SV:         74 LV SV Index:   32 LVOT Area:     4.91 cm  RIGHT VENTRICLE RV Basal diam:  3.20 cm RV S prime:     17.40 cm/s TAPSE (M-mode): 2.7 cm LEFT ATRIUM             Index       RIGHT ATRIUM           Index LA diam:        3.20 cm 1.39 cm/m  RA Area:     21.60 cm LA Vol (A2C):   49.4 ml 21.48 ml/m RA Volume:   71.80 ml  31.22 ml/m LA Vol (A4C):   28.5 ml 12.39 ml/m LA Biplane Vol: 41.3 ml 17.96 ml/m  AORTIC VALVE LVOT Vmax:   102.00 cm/s LVOT Vmean:  59.100 cm/s LVOT VTI:    0.151 m AI PHT:      440 msec  AORTA Ao Root diam: 3.90 cm MITRAL VALVE               TRICUSPID VALVE MV Area (PHT): 5.38 cm    TR Peak grad:   30.5 mmHg MV Decel Time: 141 msec    TR Vmax:        276.00 cm/s MV E velocity: 93.40 cm/s MV A velocity: 82.70 cm/s  SHUNTS MV E/A ratio:  1.13        Systemic VTI:  0.15 m                            Systemic Diam: 2.50 cm Lyman Bishop MD Electronically signed by Lyman Bishop MD Signature Date/Time: 08/05/2020/12:16:16 PM    Final    VAS [place] LOWER EXTREMITY VENOUS (DVT) (ONLY [facility] & [facility] 7a-7p)  Result Date: 08/04/2020  Lower Venous DVTStudy Indications: Bilateral lower extremity pitting edema.  Comparison Study: No prior study Performing Technologist: Maudry Mayhew MHA, RDMS, RVT, RDCS  Examination Guidelines: A complete  evaluation includes B-mode imaging, spectral Doppler, color Doppler, and power Doppler as needed of all accessible portions of each vessel. Bilateral testing is considered an integral part of a complete examination. Limited examinations for reoccurring indications may be performed as noted. The reflux portion of the exam is performed with the patient in reverse Trendelenburg.  +---------+---------------+---------+-----------+----------+--------------+ RIGHT    CompressibilityPhasicitySpontaneityPropertiesThrombus Aging +---------+---------------+---------+-----------+----------+--------------+ CFV      Full           Yes      Yes                                 +---------+---------------+---------+-----------+----------+--------------+ SFJ      Full           Yes      Yes                                 +---------+---------------+---------+-----------+----------+--------------+ FV Prox  Full           Yes      Yes                                 +---------+---------------+---------+-----------+----------+--------------+ FV Mid   Full           Yes      Yes                                 +---------+---------------+---------+-----------+----------+--------------+ FV DistalFull                                                        +---------+---------------+---------+-----------+----------+--------------+ PFV      Full                                                        +---------+---------------+---------+-----------+----------+--------------+  POP      Full                                                        +---------+---------------+---------+-----------+----------+--------------+ PTV      Full                                                        +---------+---------------+---------+-----------+----------+--------------+ PERO     Full                                                         +---------+---------------+---------+-----------+----------+--------------+   +---------+---------------+---------+-----------+----------+--------------+ LEFT     CompressibilityPhasicitySpontaneityPropertiesThrombus Aging +---------+---------------+---------+-----------+----------+--------------+ CFV      Full           Yes      Yes                                 +---------+---------------+---------+-----------+----------+--------------+ SFJ      Full                                                        +---------+---------------+---------+-----------+----------+--------------+ FV Prox  Full                                                        +---------+---------------+---------+-----------+----------+--------------+ FV Mid   Full                                                        +---------+---------------+---------+-----------+----------+--------------+ FV DistalFull                                                        +---------+---------------+---------+-----------+----------+--------------+ PFV      Full                                                        +---------+---------------+---------+-----------+----------+--------------+ POP      Full           Yes      Yes                                 +---------+---------------+---------+-----------+----------+--------------+  PTV      Full                                                        +---------+---------------+---------+-----------+----------+--------------+ PERO     Full                                                        +---------+---------------+---------+-----------+----------+--------------+     Summary: RIGHT: - There is no evidence of deep vein thrombosis in the lower extremity.  - No cystic structure found in the popliteal fossa.  LEFT: - There is no evidence of deep vein thrombosis in the lower extremity.  - No cystic structure found in the popliteal fossa.   *See table(s) above for measurements and observations. Electronically signed by Deitra Mayo MD on 08/04/2020 at 10:14:37 AM.    Final    UE VENOUS DUPLEX ([facility] & [facility] 7 am - 7 pm)  Result Date: 08/04/2020 UPPER VENOUS STUDY  Indications: Pain, and edema x3 days. Limitations: Patient position; restricted mobility. Comparison Study: No prior study Performing Technologist: Maudry Mayhew MHA, RDMS, RVT, RDCS  Examination Guidelines: A complete evaluation includes B-mode imaging, spectral Doppler, color Doppler, and power Doppler as needed of all accessible portions of each vessel. Bilateral testing is considered an integral part of a complete examination. Limited examinations for reoccurring indications may be performed as noted.  Right Findings: +----------+------------+---------+-----------+----------+-------+ RIGHT     CompressiblePhasicitySpontaneousPropertiesSummary +----------+------------+---------+-----------+----------+-------+ IJV           Full       Yes       Yes                      +----------+------------+---------+-----------+----------+-------+ Subclavian    Full       Yes       Yes                      +----------+------------+---------+-----------+----------+-------+ Axillary      Full       Yes       Yes                      +----------+------------+---------+-----------+----------+-------+ Brachial      Full       Yes       Yes                      +----------+------------+---------+-----------+----------+-------+ Radial        Full                                          +----------+------------+---------+-----------+----------+-------+ Ulnar         Full                                          +----------+------------+---------+-----------+----------+-------+ Cephalic      Full                                          +----------+------------+---------+-----------+----------+-------+  Basilic       Full                                           +----------+------------+---------+-----------+----------+-------+  Left Findings: +----------+------------+---------+-----------+----------+--------------+ LEFT      CompressiblePhasicitySpontaneousProperties   Summary     +----------+------------+---------+-----------+----------+--------------+ Subclavian                                          Not visualized +----------+------------+---------+-----------+----------+--------------+  Summary:  Right: No evidence of deep vein thrombosis in the upper extremity. No evidence of superficial vein thrombosis in the upper extremity.  *See table(s) above for measurements and observations.  Diagnosing physician: Deitra Mayo MD Electronically signed by Deitra Mayo MD on 08/04/2020 at 10:14:49 AM.    Final     Medications:   . atorvastatin  80 mg Oral Daily  . dapagliflozin propanediol  10 mg Oral Daily  . enoxaparin (LOVENOX) injection  40 mg Subcutaneous Q24H  . furosemide  40 mg Intravenous BID  . insulin aspart  0-5 Units Subcutaneous QHS  . insulin aspart  0-9 Units Subcutaneous TID WC  . isosorbide mononitrate  30 mg Oral Daily  . losartan  50 mg Oral Daily  . methylPREDNISolone (SOLU-MEDROL) injection  40 mg Intravenous Q12H  . pantoprazole  40 mg Oral Daily  . Vitamin D (Ergocalciferol)  50,000 Units Oral Q7 days   Continuous Infusions:   LOS: 2 days   Geradine Girt  [facility]   How to contact the [facility] Attending or Consulting provider [place] or covering provider during after hours [place], for this patient?  1. Check the care team in [facility] and look for a) attending/consulting [facility] provider listed and b) the [facility] team listed 2. Log into [email or web address] and use [facility] universal password to access. If you do not have the password, please contact the hospital operator. 3. Locate the [facility] provider you are looking for under [facility] and page to a number that you can be directly  reached. 4. If you still have difficulty reaching the provider, please page the [facility] (Director on Call) for the Hospitalists listed on amion for assistance.  08/05/2020, 3:20 PM

## 2020-08-05 NOTE — Plan of Care (Signed)
Problem: Education: Goal: Knowledge of General Education information will improve Description: Including pain rating scale, medication(s)/side effects and non-pharmacologic comfort measures Outcome: Completed/Met   Problem: Activity: Goal: Risk for activity intolerance will decrease Outcome: Completed/Met   Problem: Elimination: Goal: Will not experience complications related to bowel motility Outcome: Completed/Met Goal: Will not experience complications related to urinary retention Outcome: Completed/Met   Problem: Pain Managment: Goal: General experience of comfort will improve Outcome: Completed/Met   Problem: Skin Integrity: Goal: Risk for impaired skin integrity will decrease Outcome: Completed/Met

## 2020-08-05 NOTE — Progress Notes (Signed)
Patient ID: Samuel Butler, male   DOB: 09-10-1961, 59 y.o.   MRN: [id number] S: No new complaints and edema has improved.  O:BP 115/87 (BP Location: Left Arm)   Pulse 94   Temp 98 F (36.7 C)   Resp 19   Ht 5\' 11"  (1.803 m)   Wt 111.3 kg   SpO2 (!) 86%   BMI 34.22 kg/m   Intake/Output Summary (Last 24 hours) at 08/05/2020 1220 Last data filed at 08/05/2020 0935 Gross per 24 hour  Intake 960 ml  Output 1476 ml  Net -516 ml   Intake/Output: I/O last 3 completed shifts: In: 960 [P.O.:960] Out: 1626 [Urine:1625; Stool:1]  Intake/Output this shift:  Total I/O In: 240 [P.O.:240] Out: 200 [Urine:200] Weight change: 2.9 kg Gen: NAD CVS: RRR, no rub Resp: cta Abd: benign Ext: trace ankle edema  Recent Labs  Lab 08/03/20 1618 08/04/20 0859 08/05/20 0809  NA 141 139 139  K 3.5 3.7 4.0  CL 103 101 101  CO2 28 24 25   GLUCOSE 128* 179* 266*  BUN 12 11 28*  CREATININE 1.27* 1.15 1.34*  ALBUMIN  --  1.3* 1.1*  CALCIUM 7.8* 7.8*  7.6* 7.7*  PHOS  --  5.2* 4.2   Liver Function Tests: Recent Labs  Lab 08/04/20 0859 08/05/20 0809  ALBUMIN 1.3* 1.1*   No results for input(s): LIPASE, AMYLASE in the last 168 hours. No results for input(s): AMMONIA in the last 168 hours. CBC: Recent Labs  Lab 08/03/20 1618 08/04/20 0859 08/05/20 0809  WBC 8.2 6.4 14.8*  HGB 12.7* 12.7* 12.1*  HCT 39.2 38.4* 36.3*  MCV 95.1 91.2 91.7  PLT 268 303 360   Cardiac Enzymes: No results for input(s): CKTOTAL, CKMB, CKMBINDEX, TROPONINI in the last 168 hours. CBG: Recent Labs  Lab 08/04/20 0649 08/04/20 1115 08/04/20 1614 08/04/20 2047 08/05/20 0635  GLUCAP 188* 163* 248* 196* 223*    Iron Studies: No results for input(s): IRON, TIBC, TRANSFERRIN, FERRITIN in the last 72 hours. Studies/Results: DG Chest 2 View  Result Date: 08/03/2020 CLINICAL DATA:  Shortness of breath, leg swelling EXAM: CHEST - 2 VIEW COMPARISON:  Chest x-ray 12/18/2001 report without images. FINDINGS: The  heart size and mediastinal contours are within normal limits. Bibasilar linear atelectasis. No focal consolidation. No pulmonary edema. No pleural effusion. No pneumothorax. No acute osseous abnormality. IMPRESSION: No active cardiopulmonary disease. Electronically Signed   By: Iven Finn M.D.   On: 08/03/2020 18:06   ECHOCARDIOGRAM COMPLETE  Result Date: 08/05/2020    ECHOCARDIOGRAM REPORT   Patient Name:   Samuel Butler Date of Exam: 08/05/2020 Medical Rec #:  [id number]       Height:       71.0 in Accession #:    [id number]      Weight:       245.4 lb Date of Birth:  14-Nov-1960       BSA:          2.300 m Patient Age:    70 years        BP:           115/87 mmHg Patient Gender: M               HR:           94 bpm. Exam Location:  Inpatient Procedure: 2D Echo, Cardiac Doppler and Color Doppler Indications:    Dyspnea  History:        Patient has prior  history of Echocardiogram examinations, most                 recent 03/03/2020. PAD, Signs/Symptoms:Shortness of Breath and LE                 edema; Risk Factors:Hypertension, Dyslipidemia, Diabetes and                 Current Smoker.  Sonographer:    Dustin Flock Referring Phys: [place]  1. Left ventricular ejection fraction, by estimation, is 65 to 70%. The left ventricle has normal function. The left ventricle has no regional wall motion abnormalities. There is severe concentric left ventricular hypertrophy. Left ventricular diastolic  parameters are consistent with Grade I diastolic dysfunction (impaired relaxation). Elevated left ventricular end-diastolic pressure.  2. Right ventricular systolic function is normal. The right ventricular size is normal. There is moderately elevated pulmonary artery systolic pressure.  3. Left atrial size was mildly dilated.  4. The mitral valve is grossly normal. Trivial mitral valve regurgitation.  5. The aortic valve is tricuspid. Aortic valve regurgitation is mild. Aortic regurgitation  PHT measures 440 msec.  6. Aortic dilatation noted. There is mild dilatation of the ascending aorta, measuring 40 mm.  7. The inferior vena cava is dilated in size with <50% respiratory variability, suggesting right atrial pressure of 15 mmHg. Comparison(s): Changes from prior study are noted. 03/03/20: LVEF 65-70%, grade 1 DD, elevated LA pressue, RVSP 33 mmHg. FINDINGS  Left Ventricle: Left ventricular ejection fraction, by estimation, is 65 to 70%. The left ventricle has normal function. The left ventricle has no regional wall motion abnormalities. The left ventricular internal cavity size was normal in size. There is  severe concentric left ventricular hypertrophy. Left ventricular diastolic parameters are consistent with Grade I diastolic dysfunction (impaired relaxation). Elevated left ventricular end-diastolic pressure. Right Ventricle: The right ventricular size is normal. No increase in right ventricular wall thickness. Right ventricular systolic function is normal. There is moderately elevated pulmonary artery systolic pressure. The tricuspid regurgitant velocity is 2.76 m/s, and with an assumed right atrial pressure of 15 mmHg, the estimated right ventricular systolic pressure is 64.3 mmHg. Left Atrium: Left atrial size was mildly dilated. Right Atrium: Right atrial size was normal in size. Pericardium: There is no evidence of pericardial effusion. Mitral Valve: The mitral valve is grossly normal. Trivial mitral valve regurgitation. Tricuspid Valve: The tricuspid valve is grossly normal. Tricuspid valve regurgitation is mild. Aortic Valve: The aortic valve is tricuspid. Aortic valve regurgitation is mild. Aortic regurgitation PHT measures 440 msec. Pulmonic Valve: The pulmonic valve was normal in structure. Pulmonic valve regurgitation is not visualized. Aorta: Aortic dilatation noted. There is mild dilatation of the ascending aorta, measuring 40 mm. Venous: The inferior vena cava is dilated in size with  less than 50% respiratory variability, suggesting right atrial pressure of 15 mmHg. IAS/Shunts: No atrial level shunt detected by color flow Doppler.  LEFT VENTRICLE PLAX 2D LVIDd:         3.50 cm  Diastology LVIDs:         2.70 cm  LV e' medial:    4.35 cm/s LV PW:         1.70 cm  LV E/e' medial:  21.5 LV IVS:        1.70 cm  LV e' lateral:   5.22 cm/s LVOT diam:     2.50 cm  LV E/e' lateral: 17.9 LV SV:  74 LV SV Index:   32 LVOT Area:     4.91 cm  RIGHT VENTRICLE RV Basal diam:  3.20 cm RV S prime:     17.40 cm/s TAPSE (M-mode): 2.7 cm LEFT ATRIUM             Index       RIGHT ATRIUM           Index LA diam:        3.20 cm 1.39 cm/m  RA Area:     21.60 cm LA Vol (A2C):   49.4 ml 21.48 ml/m RA Volume:   71.80 ml  31.22 ml/m LA Vol (A4C):   28.5 ml 12.39 ml/m LA Biplane Vol: 41.3 ml 17.96 ml/m  AORTIC VALVE LVOT Vmax:   102.00 cm/s LVOT Vmean:  59.100 cm/s LVOT VTI:    0.151 m AI PHT:      440 msec  AORTA Ao Root diam: 3.90 cm MITRAL VALVE               TRICUSPID VALVE MV Area (PHT): 5.38 cm    TR Peak grad:   30.5 mmHg MV Decel Time: 141 msec    TR Vmax:        276.00 cm/s MV E velocity: 93.40 cm/s MV A velocity: 82.70 cm/s  SHUNTS MV E/A ratio:  1.13        Systemic VTI:  0.15 m                            Systemic Diam: 2.50 cm Lyman Bishop MD Electronically signed by Lyman Bishop MD Signature Date/Time: 08/05/2020/12:16:16 PM    Final    VAS [place] LOWER EXTREMITY VENOUS (DVT) (ONLY [facility] 7a-7p)  Result Date: 08/04/2020  Lower Venous DVTStudy Indications: Bilateral lower extremity pitting edema.  Comparison Study: No prior study Performing Technologist: Maudry Mayhew MHA, RDMS, RVT, RDCS  Examination Guidelines: A complete evaluation includes B-mode imaging, spectral Doppler, color Doppler, and power Doppler as needed of all accessible portions of each vessel. Bilateral testing is considered an integral part of a complete examination. Limited examinations for reoccurring indications may  be performed as noted. The reflux portion of the exam is performed with the patient in reverse Trendelenburg.  +---------+---------------+---------+-----------+----------+--------------+ RIGHT    CompressibilityPhasicitySpontaneityPropertiesThrombus Aging +---------+---------------+---------+-----------+----------+--------------+ CFV      Full           Yes      Yes                                 +---------+---------------+---------+-----------+----------+--------------+ SFJ      Full           Yes      Yes                                 +---------+---------------+---------+-----------+----------+--------------+ FV Prox  Full           Yes      Yes                                 +---------+---------------+---------+-----------+----------+--------------+ FV Mid   Full           Yes      Yes                                 +---------+---------------+---------+-----------+----------+--------------+  FV DistalFull                                                        +---------+---------------+---------+-----------+----------+--------------+ PFV      Full                                                        +---------+---------------+---------+-----------+----------+--------------+ POP      Full                                                        +---------+---------------+---------+-----------+----------+--------------+ PTV      Full                                                        +---------+---------------+---------+-----------+----------+--------------+ PERO     Full                                                        +---------+---------------+---------+-----------+----------+--------------+   +---------+---------------+---------+-----------+----------+--------------+ LEFT     CompressibilityPhasicitySpontaneityPropertiesThrombus Aging +---------+---------------+---------+-----------+----------+--------------+ CFV       Full           Yes      Yes                                 +---------+---------------+---------+-----------+----------+--------------+ SFJ      Full                                                        +---------+---------------+---------+-----------+----------+--------------+ FV Prox  Full                                                        +---------+---------------+---------+-----------+----------+--------------+ FV Mid   Full                                                        +---------+---------------+---------+-----------+----------+--------------+ FV DistalFull                                                        +---------+---------------+---------+-----------+----------+--------------+  PFV      Full                                                        +---------+---------------+---------+-----------+----------+--------------+ POP      Full           Yes      Yes                                 +---------+---------------+---------+-----------+----------+--------------+ PTV      Full                                                        +---------+---------------+---------+-----------+----------+--------------+ PERO     Full                                                        +---------+---------------+---------+-----------+----------+--------------+     Summary: RIGHT: - There is no evidence of deep vein thrombosis in the lower extremity.  - No cystic structure found in the popliteal fossa.  LEFT: - There is no evidence of deep vein thrombosis in the lower extremity.  - No cystic structure found in the popliteal fossa.  *See table(s) above for measurements and observations. Electronically signed by Deitra Mayo MD on 08/04/2020 at 10:14:37 AM.    Final    UE VENOUS DUPLEX ([facility] 7 am - 7 pm)  Result Date: 08/04/2020 UPPER VENOUS STUDY  Indications: Pain, and edema x3 days. Limitations: Patient position; restricted  mobility. Comparison Study: No prior study Performing Technologist: Maudry Mayhew MHA, RDMS, RVT, RDCS  Examination Guidelines: A complete evaluation includes B-mode imaging, spectral Doppler, color Doppler, and power Doppler as needed of all accessible portions of each vessel. Bilateral testing is considered an integral part of a complete examination. Limited examinations for reoccurring indications may be performed as noted.  Right Findings: +----------+------------+---------+-----------+----------+-------+ RIGHT     CompressiblePhasicitySpontaneousPropertiesSummary +----------+------------+---------+-----------+----------+-------+ IJV           Full       Yes       Yes                      +----------+------------+---------+-----------+----------+-------+ Subclavian    Full       Yes       Yes                      +----------+------------+---------+-----------+----------+-------+ Axillary      Full       Yes       Yes                      +----------+------------+---------+-----------+----------+-------+ Brachial      Full       Yes       Yes                      +----------+------------+---------+-----------+----------+-------+  Radial        Full                                          +----------+------------+---------+-----------+----------+-------+ Ulnar         Full                                          +----------+------------+---------+-----------+----------+-------+ Cephalic      Full                                          +----------+------------+---------+-----------+----------+-------+ Basilic       Full                                          +----------+------------+---------+-----------+----------+-------+  Left Findings: +----------+------------+---------+-----------+----------+--------------+ LEFT      CompressiblePhasicitySpontaneousProperties   Summary      +----------+------------+---------+-----------+----------+--------------+ Subclavian                                          Not visualized +----------+------------+---------+-----------+----------+--------------+  Summary:  Right: No evidence of deep vein thrombosis in the upper extremity. No evidence of superficial vein thrombosis in the upper extremity.  *See table(s) above for measurements and observations.  Diagnosing physician: Deitra Mayo MD Electronically signed by Deitra Mayo MD on 08/04/2020 at 10:14:49 AM.    Final    . atorvastatin  80 mg Oral Daily  . dapagliflozin propanediol  10 mg Oral Daily  . enoxaparin (LOVENOX) injection  40 mg Subcutaneous Q24H  . furosemide  40 mg Intravenous BID  . insulin aspart  0-5 Units Subcutaneous QHS  . insulin aspart  0-9 Units Subcutaneous TID WC  . isosorbide mononitrate  30 mg Oral Daily  . losartan  50 mg Oral Daily  . methylPREDNISolone (SOLU-MEDROL) injection  40 mg Intravenous Q12H  . pantoprazole  40 mg Oral Daily  . Vitamin D (Ergocalciferol)  50,000 Units Oral Q7 days    BMET    Component Value Date/Time   NA 139 08/05/2020 0809   NA 145 (H) 06/23/2020 1034   K 4.0 08/05/2020 0809   CL 101 08/05/2020 0809   CO2 25 08/05/2020 0809   GLUCOSE 266 (H) 08/05/2020 0809   BUN 28 (H) 08/05/2020 0809   BUN 29 (H) 06/23/2020 1034   CREATININE 1.34 (H) 08/05/2020 0809   CREATININE 1.27 (H) 05/25/2020 0849   CALCIUM 7.7 (L) 08/05/2020 0809   CALCIUM 7.6 (L) 08/04/2020 0859   GFRNONAA 57 (L) 08/05/2020 0809   GFRNONAA 87 04/05/2016 0918   GFRAA >60 08/05/2020 0809   GFRAA >89 04/05/2016 0918   CBC    Component Value Date/Time   WBC 14.8 (H) 08/05/2020 0809   RBC 3.96 (L) 08/05/2020 0809   HGB 12.1 (L) 08/05/2020 0809   HGB 14.2 06/23/2020 1034   HCT 36.3 (L) 08/05/2020 0809   HCT 43.7 06/23/2020 1034   PLT 360 08/05/2020 0809   PLT 351 06/23/2020 1034  MCV 91.7 08/05/2020 0809   MCV 90 06/23/2020 1034    MCH 30.6 08/05/2020 0809   MCHC 33.3 08/05/2020 0809   RDW 15.2 08/05/2020 0809   RDW 13.3 06/23/2020 1034   LYMPHSABS 1,734 05/25/2020 0849   MONOABS 0.5 12/23/2019 0752   EOSABS 423 05/25/2020 0849   BASOSABS 18 05/25/2020 0849     Assessment/Plan: 1. Anasarca-pt with worsening edema of lower extremities and hypoalbuminemia.  Possible Nephrotic syndrome but he also has a history of diastolic dysfunction (on ECHO 4/21), as well as liver disease.  He has longstanding DM which can sometimes present with nephrotic syndromebut also has significant proteinuria and hypoalbuminemia possible nephrotic syndrome. 1. Will order 24 hour urine for protein along with serologies given blood and protein on UA 2. + hep B SAg, negative ASO, normal complements, HIV negative,  3. DsDNA/ANCA/HepC/UPEP/SPEP pending 4. Discussed renal biopsy and will consult IR for biopsy on Monday if he is still here or as an outpatient if he is discharged over the weekend 5. He is already on ARB and diuretics 6. Check hepatitis panels, HIV, SPEP/UPEP, lipid panel. 7. Continue with IV diuresis. 2. SOB and DOE- CXR without pulmonary edema.  Unclear etiology.  Consider repeating ECHO. 3. Arthralgias- possible gout vs migratory arthritis.  Will check serologies  4. DM- per primary 5. HTN- stable 6. CKD stage 2- presumably due to DM and HTN.  Started on Farxiga recently by Dr. Candiss Norse. Donetta Potts, MD [employer] [phone]

## 2020-08-06 DIAGNOSIS — N179 Acute kidney failure, unspecified: Secondary | ICD-10-CM

## 2020-08-06 LAB — PROTEIN, URINE, 24 HOUR
Collection Interval-UPROT: 24 hours
Protein, 24H Urine: 3720 mg/d — ABNORMAL HIGH (ref 50–100)
Protein, Urine: 124 mg/dL
Urine Total Volume-UPROT: 3000 mL

## 2020-08-06 LAB — GLUCOSE, CAPILLARY
Glucose-Capillary: 152 mg/dL — ABNORMAL HIGH (ref 70–99)
Glucose-Capillary: 113 mg/dL — ABNORMAL HIGH (ref 70–99)
Glucose-Capillary: 176 mg/dL — ABNORMAL HIGH (ref 70–99)
Glucose-Capillary: 183 mg/dL — ABNORMAL HIGH (ref 70–99)

## 2020-08-06 LAB — RENAL FUNCTION PANEL
Albumin: 1.1 g/dL — ABNORMAL LOW (ref 3.5–5.0)
Anion gap: 8 (ref 5–15)
BUN: 31 mg/dL — ABNORMAL HIGH (ref 6–20)
CO2: 27 mmol/L (ref 22–32)
Calcium: 7.6 mg/dL — ABNORMAL LOW (ref 8.9–10.3)
Chloride: 104 mmol/L (ref 98–111)
Creatinine, Ser: 1.3 mg/dL — ABNORMAL HIGH (ref 0.61–1.24)
GFR calc Af Amer: >60 mL/min (ref >60)
GFR calc non Af Amer: 59 mL/min — ABNORMAL LOW (ref >60)
Glucose, Bld: 207 mg/dL — ABNORMAL HIGH (ref 70–99)
Phosphorus: 3.2 mg/dL (ref 2.5–4.6)
Potassium: 3.6 mmol/L (ref 3.5–5.1)
Sodium: 139 mmol/L (ref 135–145)

## 2020-08-06 LAB — LIPID PANEL
Cholesterol: 255 mg/dL — ABNORMAL HIGH (ref 0–200)
HDL: 49 mg/dL (ref >40)
LDL Cholesterol: 191 mg/dL — ABNORMAL HIGH (ref 0–99)
Total CHOL/HDL Ratio: 5.2 RATIO
Triglycerides: 74 mg/dL (ref <150)
VLDL: 15 mg/dL (ref 0–40)

## 2020-08-06 LAB — HEPATITIS B SURFACE ANTIGEN: Hepatitis B Surface Ag: REACTIVE — AB

## 2020-08-06 MED ORDER — DAPAGLIFLOZIN PROPANEDIOL 10 MG PO TABS
10.0000 mg | ORAL_TABLET | Freq: Every day | ORAL | Status: DC
  Administered 2020-08-07 – 2020-08-08 (×2): 10 mg via ORAL
  Filled 2020-08-06 (×2): qty 1

## 2020-08-06 NOTE — Progress Notes (Signed)
Patient ID: Samuel Butler, male   DOB: Dec 09, 1960, 59 y.o.   MRN: [id number] S: Feels well, no c/o O:BP 124/85 (BP Location: Left Arm)   Pulse 79   Temp 98.3 F (36.8 C) (Oral)   Resp 18   Ht 5\' 11"  (1.803 m)   Wt 110.7 kg   SpO2 98%   BMI 34.04 kg/m   Intake/Output Summary (Last 24 hours) at 08/06/2020 1158 Last data filed at 08/06/2020 0900 Gross per 24 hour  Intake 1300 ml  Output 3025 ml  Net -1725 ml   Intake/Output: I/O last 3 completed shifts: In: 1540 [P.O.:1540] Out: 2850 [Urine:2850]  Intake/Output this shift:  Total I/O In: 240 [P.O.:240] Out: 650 [Urine:650] Weight change: -0.6 kg Gen: NAD CVS: no rub Resp: cta Abd: benign Ext: trace to 1+ pretibial edema  Recent Labs  Lab 08/03/20 1618 08/04/20 0859 08/05/20 0809 08/06/20 0417  NA 141 139 139 139  K 3.5 3.7 4.0 3.6  CL 103 101 101 104  CO2 28 24 25 27   GLUCOSE 128* 179* 266* 207*  BUN 12 11 28* 31*  CREATININE 1.27* 1.15 1.34* 1.30*  ALBUMIN  --  1.3* 1.1* 1.1*  CALCIUM 7.8* 7.8*  7.6* 7.7* 7.6*  PHOS  --  5.2* 4.2 3.2   Liver Function Tests: Recent Labs  Lab 08/04/20 0859 08/05/20 0809 08/06/20 0417  ALBUMIN 1.3* 1.1* 1.1*   No results for input(s): LIPASE, AMYLASE in the last 168 hours. No results for input(s): AMMONIA in the last 168 hours. CBC: Recent Labs  Lab 08/03/20 1618 08/04/20 0859 08/05/20 0809  WBC 8.2 6.4 14.8*  HGB 12.7* 12.7* 12.1*  HCT 39.2 38.4* 36.3*  MCV 95.1 91.2 91.7  PLT 268 303 360   Cardiac Enzymes: No results for input(s): CKTOTAL, CKMB, CKMBINDEX, TROPONINI in the last 168 hours. CBG: Recent Labs  Lab 08/04/20 2047 08/05/20 0635 08/05/20 1626 08/05/20 2124 08/06/20 0648  GLUCAP 196* 223* 209* 195* 152*    Iron Studies: No results for input(s): IRON, TIBC, TRANSFERRIN, FERRITIN in the last 72 hours. Studies/Results: ECHOCARDIOGRAM COMPLETE  Result Date: 08/05/2020    ECHOCARDIOGRAM REPORT   Patient Name:   Jodey A Hoefle Date of Exam:  08/05/2020 Medical Rec #:  [id number]       Height:       71.0 in Accession #:    [id number]      Weight:       245.4 lb Date of Birth:  [birth year]       BSA:          2.300 m Patient Age:    89 years        BP:           115/87 mmHg Patient Gender: M               HR:           94 bpm. Exam Location:  Inpatient Procedure: 2D Echo, Cardiac Doppler and Color Doppler Indications:    Dyspnea  History:        Patient has prior history of Echocardiogram examinations, most                 recent 03/03/2020. PAD, Signs/Symptoms:Shortness of Breath and LE                 edema; Risk Factors:Hypertension, Dyslipidemia, Diabetes and                 Current  Smoker.  Sonographer:    Dustin Flock Referring Phys: [place]  1. Left ventricular ejection fraction, by estimation, is 65 to 70%. The left ventricle has normal function. The left ventricle has no regional wall motion abnormalities. There is severe concentric left ventricular hypertrophy. Left ventricular diastolic  parameters are consistent with Grade I diastolic dysfunction (impaired relaxation). Elevated left ventricular end-diastolic pressure.  2. Right ventricular systolic function is normal. The right ventricular size is normal. There is moderately elevated pulmonary artery systolic pressure.  3. Left atrial size was mildly dilated.  4. The mitral valve is grossly normal. Trivial mitral valve regurgitation.  5. The aortic valve is tricuspid. Aortic valve regurgitation is mild. Aortic regurgitation PHT measures 440 msec.  6. Aortic dilatation noted. There is mild dilatation of the ascending aorta, measuring 40 mm.  7. The inferior vena cava is dilated in size with <50% respiratory variability, suggesting right atrial pressure of 15 mmHg. Comparison(s): Changes from prior study are noted. 03/03/20: LVEF 65-70%, grade 1 DD, elevated LA pressue, RVSP 33 mmHg. FINDINGS  Left Ventricle: Left ventricular ejection fraction, by estimation, is 65 to  70%. The left ventricle has normal function. The left ventricle has no regional wall motion abnormalities. The left ventricular internal cavity size was normal in size. There is  severe concentric left ventricular hypertrophy. Left ventricular diastolic parameters are consistent with Grade I diastolic dysfunction (impaired relaxation). Elevated left ventricular end-diastolic pressure. Right Ventricle: The right ventricular size is normal. No increase in right ventricular wall thickness. Right ventricular systolic function is normal. There is moderately elevated pulmonary artery systolic pressure. The tricuspid regurgitant velocity is 2.76 m/s, and with an assumed right atrial pressure of 15 mmHg, the estimated right ventricular systolic pressure is 39.7 mmHg. Left Atrium: Left atrial size was mildly dilated. Right Atrium: Right atrial size was normal in size. Pericardium: There is no evidence of pericardial effusion. Mitral Valve: The mitral valve is grossly normal. Trivial mitral valve regurgitation. Tricuspid Valve: The tricuspid valve is grossly normal. Tricuspid valve regurgitation is mild. Aortic Valve: The aortic valve is tricuspid. Aortic valve regurgitation is mild. Aortic regurgitation PHT measures 440 msec. Pulmonic Valve: The pulmonic valve was normal in structure. Pulmonic valve regurgitation is not visualized. Aorta: Aortic dilatation noted. There is mild dilatation of the ascending aorta, measuring 40 mm. Venous: The inferior vena cava is dilated in size with less than 50% respiratory variability, suggesting right atrial pressure of 15 mmHg. IAS/Shunts: No atrial level shunt detected by color flow Doppler.  LEFT VENTRICLE PLAX 2D LVIDd:         3.50 cm  Diastology LVIDs:         2.70 cm  LV e' medial:    4.35 cm/s LV PW:         1.70 cm  LV E/e' medial:  21.5 LV IVS:        1.70 cm  LV e' lateral:   5.22 cm/s LVOT diam:     2.50 cm  LV E/e' lateral: 17.9 LV SV:         74 LV SV Index:   32 LVOT Area:      4.91 cm  RIGHT VENTRICLE RV Basal diam:  3.20 cm RV S prime:     17.40 cm/s TAPSE (M-mode): 2.7 cm LEFT ATRIUM             Index       RIGHT ATRIUM  Index LA diam:        3.20 cm 1.39 cm/m  RA Area:     21.60 cm LA Vol (A2C):   49.4 ml 21.48 ml/m RA Volume:   71.80 ml  31.22 ml/m LA Vol (A4C):   28.5 ml 12.39 ml/m LA Biplane Vol: 41.3 ml 17.96 ml/m  AORTIC VALVE LVOT Vmax:   102.00 cm/s LVOT Vmean:  59.100 cm/s LVOT VTI:    0.151 m AI PHT:      440 msec  AORTA Ao Root diam: 3.90 cm MITRAL VALVE               TRICUSPID VALVE MV Area (PHT): 5.38 cm    TR Peak grad:   30.5 mmHg MV Decel Time: 141 msec    TR Vmax:        276.00 cm/s MV E velocity: 93.40 cm/s MV A velocity: 82.70 cm/s  SHUNTS MV E/A ratio:  1.13        Systemic VTI:  0.15 m                            Systemic Diam: 2.50 cm Lyman Bishop MD Electronically signed by Lyman Bishop MD Signature Date/Time: 08/05/2020/12:16:16 PM    Final    . atorvastatin  80 mg Oral Daily  . dapagliflozin propanediol  10 mg Oral Daily  . enoxaparin (LOVENOX) injection  40 mg Subcutaneous Q24H  . furosemide  40 mg Intravenous BID  . insulin aspart  0-5 Units Subcutaneous QHS  . insulin aspart  0-9 Units Subcutaneous TID WC  . isosorbide mononitrate  30 mg Oral Daily  . losartan  50 mg Oral Daily  . pantoprazole  40 mg Oral Daily  . predniSONE  40 mg Oral Q breakfast  . Vitamin D (Ergocalciferol)  50,000 Units Oral Q7 days    BMET    Component Value Date/Time   NA 139 08/06/2020 0417   NA 145 (H) 06/23/2020 1034   K 3.6 08/06/2020 0417   CL 104 08/06/2020 0417   CO2 27 08/06/2020 0417   GLUCOSE 207 (H) 08/06/2020 0417   BUN 31 (H) 08/06/2020 0417   BUN 29 (H) 06/23/2020 1034   CREATININE 1.30 (H) 08/06/2020 0417   CREATININE 1.27 (H) 05/25/2020 0849   CALCIUM 7.6 (L) 08/06/2020 0417   CALCIUM 7.6 (L) 08/04/2020 0859   GFRNONAA 59 (L) 08/06/2020 0417   GFRNONAA 87 04/05/2016 0918   GFRAA >60 08/06/2020 0417   GFRAA >89  04/05/2016 0918   CBC    Component Value Date/Time   WBC 14.8 (H) 08/05/2020 0809   RBC 3.96 (L) 08/05/2020 0809   HGB 12.1 (L) 08/05/2020 0809   HGB 14.2 06/23/2020 1034   HCT 36.3 (L) 08/05/2020 0809   HCT 43.7 06/23/2020 1034   PLT 360 08/05/2020 0809   PLT 351 06/23/2020 1034   MCV 91.7 08/05/2020 0809   MCV 90 06/23/2020 1034   MCH 30.6 08/05/2020 0809   MCHC 33.3 08/05/2020 0809   RDW 15.2 08/05/2020 0809   RDW 13.3 06/23/2020 1034   LYMPHSABS 1,734 05/25/2020 0849   MONOABS 0.5 12/23/2019 0752   EOSABS 423 05/25/2020 0849   BASOSABS 18 05/25/2020 0849     Assessment/Plan: 1. Anasarca-pt with worsening edema of lower extremities and hypoalbuminemia. Possible Nephrotic syndrome but he also has a history of diastolic dysfunction (on ECHO 4/21), as well as liver disease. He has longstanding DM  which can sometimes present with nephrotic syndromebut also has significant proteinuria and hypoalbuminemia possible nephrotic syndrome. 1. 24 hour urine with 3.7 grams of protein 2. +Hep B SAg, negative ASO, normal complements, HIV negative, dsDNA negative 3. + PR-3 at 14.6, negative MPO ANCA 4. HepC/UPEP/SPEP pending 5. Consulted IR for biopsy on Monday 6. He is already on ARB and diuretics 7. Check hepatitis panels, HIV, SPEP/UPEP, lipid panel. 8. Cont with IV diuresis for now but will be able to change to po tomorrow. 2. SOB and DOE- CXR without pulmonary edema. Unclear etiology. Consider repeating ECHO. 3. Arthralgias- possible gout vs migratory arthritis. Will check serologies  4. DM- per primary 5. HTN- stable 6. CKD stage 2- presumably due to DM and HTN. Started on Farxiga recently by Dr. Candiss Norse.  Donetta Potts, MD [employer] [phone]

## 2020-08-06 NOTE — Progress Notes (Signed)
Progress Note    Samuel Butler  ZOX:[id number] DOB: 1961/04/09  DOA: 08/03/2020 PCP: Isaac Bliss, Rayford Halsted, MD    Brief Narrative:     Medical records reviewed and are as summarized below:  Samuel Butler is an 59 y.o. male with a PMH significant for longstanding HTN, DM type 2 (20 years), PAD s/p right SFA stenting, HLD, h/o hepatitis (not sure which but did receive treatment), h/o polysubstance abuse, and HLD who was sent to [facility] ED with worsening lower extremity edema, orthopnea, and PND as well as worsening arthralgias of his wrists.  Assessment/Plan:   Active Problems:   Essential hypertension, benign   Hyperlipemia   Gout   Diet-controlled type 2 diabetes mellitus (HCC)   Tobacco use disorder   Hypoalbuminemia   Peripheral arterial disease (HCC)   Hypokalemia   AKI (acute kidney injury) ([place])   Anasarca   anasarca:  ? related to renal dsfn?  Patient also has cirrhosis and diastolic heart failure per prior.  No obvious CHF on chest x-ray.     -nephrology consult appreciated plan for 24-hour urine with increased protein -Continue IV diuretics --HepC/UPEP/SPEP pending -echo shows worsening LVH: ? From volume overload? -IR consult for biopsy in the AM  right elbow swelling and tenderness: Appears to be possibly due to gout versus migratory arthritis.   -IV steroids- wean off  CKD stage 2 per renal  Diabetes:  -Sliding scale insulin. -hold metformin -Monitor closely while on steroids  hypertension:  -monitor closely, avoid hypotension   tobacco use disorder:  -encouraged cessation  obesity Body mass index is 34.04 kg/m.   Family Communication/Anticipated D/C date and plan/Code Status   DVT prophylaxis: Lovenox ordered. Code Status: Full Code.  Disposition Plan: Status is: Inpatient  Remains inpatient appropriate because:Inpatient level of care appropriate due to severity of illness   Dispo: The patient is from: Home               Anticipated d/c is to: Home              Anticipated d/c date is: > 3 days              Patient currently is not medically stable to d/c.         Medical Consultants:   Nephrology   Subjective:   Up walking to the bathroom, denies SOB  Objective:  Blood pressure 124/85, pulse 79, temperature 98.3 F (36.8 C), temperature source Oral, resp. rate 18, height 5\' 11"  (1.803 m), weight 110.7 kg, SpO2 98 %.    Intake/Output Summary (Last 24 hours) at 08/06/2020 1332 Last data filed at 08/06/2020 0900 Gross per 24 hour  Intake 820 ml  Output 2525 ml  Net -1705 ml   Filed Weights   08/03/20 2215 08/04/20 2045 08/05/20 2200  Weight: 108.4 kg 111.3 kg 110.7 kg    Exam:  General: Appearance:    Obese male in no acute distress     Lungs:     respirations unlabored  Heart:    Normal heart rate. Normal rhythm. No murmurs, rubs, or gallops.   MS:   All extremities are intact. Min LE edema, elbow still swollen but not as tender  Neurologic:   Awake, alert, oriented x 3. No apparent focal neurological           defect.       Data Reviewed:   I have personally reviewed following labs and imaging studies:  Labs: Labs show the following:   Basic Metabolic Panel: Recent Labs  Lab 08/03/20 1618 08/03/20 1618 08/04/20 0859 08/04/20 0859 08/05/20 0809 08/06/20 0417  NA 141  --  139  --  139 139  K 3.5   < > 3.7   < > 4.0 3.6  CL 103  --  101  --  101 104  CO2 28  --  24  --  25 27  GLUCOSE 128*  --  179*  --  266* 207*  BUN 12  --  11  --  28* 31*  CREATININE 1.27*  --  1.15  --  1.34* 1.30*  CALCIUM 7.8*  --  7.8*   7.6*  --  7.7* 7.6*  PHOS  --   --  5.2*  --  4.2 3.2   < > = values in this interval not displayed.   GFR Estimated Creatinine Clearance: 76.5 mL/min (A) (by C-G formula based on SCr of 1.3 mg/dL (H)). Liver Function Tests: Recent Labs  Lab 08/04/20 0859 08/05/20 0809 08/06/20 0417  ALBUMIN 1.3* 1.1* 1.1*   No results for input(s): LIPASE,  AMYLASE in the last 168 hours. No results for input(s): AMMONIA in the last 168 hours. Coagulation profile No results for input(s): INR, PROTIME in the last 168 hours.  CBC: Recent Labs  Lab 08/03/20 1618 08/04/20 0859 08/05/20 0809  WBC 8.2 6.4 14.8*  HGB 12.7* 12.7* 12.1*  HCT 39.2 38.4* 36.3*  MCV 95.1 91.2 91.7  PLT 268 303 360   Cardiac Enzymes: No results for input(s): CKTOTAL, CKMB, CKMBINDEX, TROPONINI in the last 168 hours. BNP (last 3 results) No results for input(s): PROBNP in the last 8760 hours. CBG: Recent Labs  Lab 08/04/20 2047 08/05/20 0635 08/05/20 1626 08/05/20 2124 08/06/20 0648  GLUCAP 196* 223* 209* 195* 152*   D-Dimer: No results for input(s): DDIMER in the last 72 hours. Hgb A1c: No results for input(s): HGBA1C in the last 72 hours. Lipid Profile: Recent Labs    08/06/20 0417  CHOL 255*  HDL 49  LDLCALC 191*  TRIG 74  CHOLHDL 5.2   Thyroid function studies: No results for input(s): TSH, T4TOTAL, T3FREE, THYROIDAB in the last 72 hours.  Invalid input(s): FREET3 Anemia work up: No results for input(s): VITAMINB12, FOLATE, FERRITIN, TIBC, IRON, RETICCTPCT in the last 72 hours. Sepsis Labs: Recent Labs  Lab 08/03/20 1618 08/04/20 0859 08/05/20 0809  WBC 8.2 6.4 14.8*    Microbiology Recent Results (from the past 240 hour(s))  Respiratory Panel by RT PCR (Flu A&B, Covid) - Nasopharyngeal Swab     Status: None   Collection Time: 08/03/20  6:10 PM   Specimen: Nasopharyngeal Swab  Result Value Ref Range Status   SARS Coronavirus 2 by RT PCR NEGATIVE NEGATIVE Final    Comment: (NOTE) SARS-CoV-2 target nucleic acids are NOT DETECTED.  The SARS-CoV-2 RNA is generally detectable in upper respiratoy specimens during the acute phase of infection. The lowest concentration of SARS-CoV-2 viral copies this assay can detect is 131 copies/mL. A negative result does not preclude SARS-Cov-2 infection and should not be used as the sole basis  for treatment or other patient management decisions. A negative result may occur with  improper specimen collection/handling, submission of specimen other than nasopharyngeal swab, presence of viral mutation(s) within the areas targeted by this assay, and inadequate number of viral copies (<131 copies/mL). A negative result must be combined with clinical observations, patient history, and epidemiological information. The  expected result is Negative.  Fact Sheet for Patients:  PinkCheek.be  Fact Sheet for Healthcare Providers:  GravelBags.it  This test is no t yet approved or cleared by the [place] FDA and  has been authorized for detection and/or diagnosis of SARS-CoV-2 by FDA under an Emergency Use Authorization (EUA). This EUA will remain  in effect (meaning this test can be used) for the duration of the COVID-19 declaration under Section 564(b)(1) of the Act, 21 U.S.C. section 360bbb-3(b)(1), unless the authorization is terminated or revoked sooner.     Influenza A by PCR NEGATIVE NEGATIVE Final   Influenza B by PCR NEGATIVE NEGATIVE Final    Comment: (NOTE) The Xpert Xpress SARS-CoV-2/FLU/RSV assay is intended as an aid in  the diagnosis of influenza from Nasopharyngeal swab specimens and  should not be used as a sole basis for treatment. Nasal washings and  aspirates are unacceptable for Xpert Xpress SARS-CoV-2/FLU/RSV  testing.  Fact Sheet for Patients: PinkCheek.be  Fact Sheet for Healthcare Providers: GravelBags.it  This test is not yet approved or cleared by the [place] FDA and  has been authorized for detection and/or diagnosis of SARS-CoV-2 by  FDA under an Emergency Use Authorization (EUA). This EUA will remain  in effect (meaning this test can be used) for the duration of the  Covid-19 declaration under Section 564(b)(1) of the Act, 21   U.S.C. section 360bbb-3(b)(1), unless the authorization is  terminated or revoked. Performed at [facility], [place] [address] [place], [place] [address]     Procedures and diagnostic studies:  ECHOCARDIOGRAM COMPLETE  Result Date: 08/05/2020    ECHOCARDIOGRAM REPORT   Patient Name:   Samuel Butler Date of Exam: 08/05/2020 Medical Rec #:  [id number]       Height:       71.0 in Accession #:    [id number]      Weight:       245.4 lb Date of Birth:  04-06-1961       BSA:          2.300 m Patient Age:    54 years        BP:           115/87 mmHg Patient Gender: M               HR:           94 bpm. Exam Location:  Inpatient Procedure: 2D Echo, Cardiac Doppler and Color Doppler Indications:    Dyspnea  History:        Patient has prior history of Echocardiogram examinations, most                 recent 03/03/2020. PAD, Signs/Symptoms:Shortness of Breath and LE                 edema; Risk Factors:Hypertension, Dyslipidemia, Diabetes and                 Current Smoker.  Sonographer:    Dustin Flock Referring Phys: [place]  1. Left ventricular ejection fraction, by estimation, is 65 to 70%. The left ventricle has normal function. The left ventricle has no regional wall motion abnormalities. There is severe concentric left ventricular hypertrophy. Left ventricular diastolic  parameters are consistent with Grade I diastolic dysfunction (impaired relaxation). Elevated left ventricular end-diastolic pressure.  2. Right ventricular systolic function is normal. The right ventricular size is normal. There is moderately elevated pulmonary artery  systolic pressure.  3. Left atrial size was mildly dilated.  4. The mitral valve is grossly normal. Trivial mitral valve regurgitation.  5. The aortic valve is tricuspid. Aortic valve regurgitation is mild. Aortic regurgitation PHT measures 440 msec.  6. Aortic dilatation noted. There is mild dilatation of the ascending aorta, measuring 40 mm.   7. The inferior vena cava is dilated in size with <50% respiratory variability, suggesting right atrial pressure of 15 mmHg. Comparison(s): Changes from prior study are noted. 03/03/20: LVEF 65-70%, grade 1 DD, elevated LA pressue, RVSP 33 mmHg. FINDINGS  Left Ventricle: Left ventricular ejection fraction, by estimation, is 65 to 70%. The left ventricle has normal function. The left ventricle has no regional wall motion abnormalities. The left ventricular internal cavity size was normal in size. There is  severe concentric left ventricular hypertrophy. Left ventricular diastolic parameters are consistent with Grade I diastolic dysfunction (impaired relaxation). Elevated left ventricular end-diastolic pressure. Right Ventricle: The right ventricular size is normal. No increase in right ventricular wall thickness. Right ventricular systolic function is normal. There is moderately elevated pulmonary artery systolic pressure. The tricuspid regurgitant velocity is 2.76 m/s, and with an assumed right atrial pressure of 15 mmHg, the estimated right ventricular systolic pressure is 76.7 mmHg. Left Atrium: Left atrial size was mildly dilated. Right Atrium: Right atrial size was normal in size. Pericardium: There is no evidence of pericardial effusion. Mitral Valve: The mitral valve is grossly normal. Trivial mitral valve regurgitation. Tricuspid Valve: The tricuspid valve is grossly normal. Tricuspid valve regurgitation is mild. Aortic Valve: The aortic valve is tricuspid. Aortic valve regurgitation is mild. Aortic regurgitation PHT measures 440 msec. Pulmonic Valve: The pulmonic valve was normal in structure. Pulmonic valve regurgitation is not visualized. Aorta: Aortic dilatation noted. There is mild dilatation of the ascending aorta, measuring 40 mm. Venous: The inferior vena cava is dilated in size with less than 50% respiratory variability, suggesting right atrial pressure of 15 mmHg. IAS/Shunts: No atrial level shunt  detected by color flow Doppler.  LEFT VENTRICLE PLAX 2D LVIDd:         3.50 cm  Diastology LVIDs:         2.70 cm  LV e' medial:    4.35 cm/s LV PW:         1.70 cm  LV E/e' medial:  21.5 LV IVS:        1.70 cm  LV e' lateral:   5.22 cm/s LVOT diam:     2.50 cm  LV E/e' lateral: 17.9 LV SV:         74 LV SV Index:   32 LVOT Area:     4.91 cm  RIGHT VENTRICLE RV Basal diam:  3.20 cm RV S prime:     17.40 cm/s TAPSE (M-mode): 2.7 cm LEFT ATRIUM             Index       RIGHT ATRIUM           Index LA diam:        3.20 cm 1.39 cm/m  RA Area:     21.60 cm LA Vol (A2C):   49.4 ml 21.48 ml/m RA Volume:   71.80 ml  31.22 ml/m LA Vol (A4C):   28.5 ml 12.39 ml/m LA Biplane Vol: 41.3 ml 17.96 ml/m  AORTIC VALVE LVOT Vmax:   102.00 cm/s LVOT Vmean:  59.100 cm/s LVOT VTI:    0.151 m AI PHT:  440 msec  AORTA Ao Root diam: 3.90 cm MITRAL VALVE               TRICUSPID VALVE MV Area (PHT): 5.38 cm    TR Peak grad:   30.5 mmHg MV Decel Time: 141 msec    TR Vmax:        276.00 cm/s MV E velocity: 93.40 cm/s MV A velocity: 82.70 cm/s  SHUNTS MV E/A ratio:  1.13        Systemic VTI:  0.15 m                            Systemic Diam: 2.50 cm Lyman Bishop MD Electronically signed by Lyman Bishop MD Signature Date/Time: 08/05/2020/12:16:16 PM    Final     Medications:    atorvastatin  80 mg Oral Daily   [START ON 08/07/2020] dapagliflozin propanediol  10 mg Oral Daily   enoxaparin (LOVENOX) injection  40 mg Subcutaneous Q24H   furosemide  40 mg Intravenous BID   insulin aspart  0-5 Units Subcutaneous QHS   insulin aspart  0-9 Units Subcutaneous TID WC   isosorbide mononitrate  30 mg Oral Daily   losartan  50 mg Oral Daily   pantoprazole  40 mg Oral Daily   predniSONE  40 mg Oral Q breakfast   Vitamin D (Ergocalciferol)  50,000 Units Oral Q7 days   Continuous Infusions:   LOS: 3 days   Geradine Girt  [facility]   How to contact the [facility] Attending or Consulting provider [place] or  covering provider during after hours [place], for this patient?  1. Check the care team in [facility] and look for a) attending/consulting [facility] provider listed and b) the [facility] team listed 2. Log into [email or web address] and use [place]'s universal password to access. If you do not have the password, please contact the hospital operator. 3. Locate the [facility] provider you are looking for under [facility] and page to a number that you can be directly reached. 4. If you still have difficulty reaching the provider, please page the [facility] (Director on Call) for the Hospitalists listed on amion for assistance.  08/06/2020, 1:32 PM

## 2020-08-07 ENCOUNTER — Inpatient Hospital Stay (HOSPITAL_COMMUNITY): Payer: BC Managed Care – PPO

## 2020-08-07 DIAGNOSIS — M109 Gout, unspecified: Secondary | ICD-10-CM

## 2020-08-07 LAB — CBC
HCT: 35.2 % — ABNORMAL LOW (ref 39.0–52.0)
Hemoglobin: 11.3 g/dL — ABNORMAL LOW (ref 13.0–17.0)
MCH: 29.9 pg (ref 26.0–34.0)
MCHC: 32.1 g/dL (ref 30.0–36.0)
MCV: 93.1 fL (ref 80.0–100.0)
Platelets: 352 10*3/uL (ref 150–400)
RBC: 3.78 MIL/uL — ABNORMAL LOW (ref 4.22–5.81)
RDW: 15.4 % (ref 11.5–15.5)
WBC: 7.6 10*3/uL (ref 4.0–10.5)
nRBC: 0 % (ref 0.0–0.2)

## 2020-08-07 LAB — HEPATITIS C VRS RNA DETECT BY PCR-QUAL: Hepatitis C Vrs RNA by PCR-Qual: NEGATIVE

## 2020-08-07 LAB — GLUCOSE, CAPILLARY
Glucose-Capillary: 122 mg/dL — ABNORMAL HIGH (ref 70–99)
Glucose-Capillary: 97 mg/dL (ref 70–99)
Glucose-Capillary: 239 mg/dL — ABNORMAL HIGH (ref 70–99)
Glucose-Capillary: 171 mg/dL — ABNORMAL HIGH (ref 70–99)

## 2020-08-07 LAB — RENAL FUNCTION PANEL
Albumin: 1.1 g/dL — ABNORMAL LOW (ref 3.5–5.0)
Anion gap: 9 (ref 5–15)
BUN: 31 mg/dL — ABNORMAL HIGH (ref 6–20)
CO2: 28 mmol/L (ref 22–32)
Calcium: 7.5 mg/dL — ABNORMAL LOW (ref 8.9–10.3)
Chloride: 104 mmol/L (ref 98–111)
Creatinine, Ser: 1.22 mg/dL (ref 0.61–1.24)
GFR calc Af Amer: >60 mL/min (ref >60)
GFR calc non Af Amer: >60 mL/min (ref >60)
Glucose, Bld: 152 mg/dL — ABNORMAL HIGH (ref 70–99)
Phosphorus: 3.1 mg/dL (ref 2.5–4.6)
Potassium: 3.5 mmol/L (ref 3.5–5.1)
Sodium: 141 mmol/L (ref 135–145)

## 2020-08-07 LAB — KAPPA/LAMBDA LIGHT CHAINS
Kappa free light chain: 18 mg/L (ref 3.3–19.4)
Kappa, lambda light chain ratio: 0.09 — ABNORMAL LOW (ref 0.26–1.65)
Lambda free light chains: 192.5 mg/L — ABNORMAL HIGH (ref 5.7–26.3)

## 2020-08-07 LAB — ANTINUCLEAR ANTIBODIES, IFA: ANA Ab, IFA: NEGATIVE

## 2020-08-07 LAB — PROTIME-INR
INR: 1 (ref 0.8–1.2)
Prothrombin Time: 13 seconds (ref 11.4–15.2)

## 2020-08-07 LAB — IMMUNOFIXATION, URINE

## 2020-08-07 MED ORDER — FENTANYL CITRATE (PF) 100 MCG/2ML IJ SOLN
INTRAMUSCULAR | Status: AC
  Filled 2020-08-07: qty 2

## 2020-08-07 MED ORDER — GELATIN ABSORBABLE 12-7 MM EX MISC
CUTANEOUS | Status: AC
  Filled 2020-08-07: qty 1

## 2020-08-07 MED ORDER — POTASSIUM CHLORIDE CRYS ER 20 MEQ PO TBCR
20.0000 meq | EXTENDED_RELEASE_TABLET | Freq: Every day | ORAL | Status: DC
  Administered 2020-08-07 – 2020-08-08 (×2): 20 meq via ORAL
  Filled 2020-08-07 (×2): qty 1

## 2020-08-07 MED ORDER — FENTANYL CITRATE (PF) 100 MCG/2ML IJ SOLN
INTRAMUSCULAR | Status: AC | PRN
  Administered 2020-08-07: 50 ug via INTRAVENOUS
  Administered 2020-08-07: 25 ug via INTRAVENOUS

## 2020-08-07 MED ORDER — LIDOCAINE HCL (PF) 1 % IJ SOLN
INTRAMUSCULAR | Status: AC
  Filled 2020-08-07: qty 30

## 2020-08-07 MED ORDER — MIDAZOLAM HCL 2 MG/2ML IJ SOLN
INTRAMUSCULAR | Status: AC | PRN
  Administered 2020-08-07 (×2): 1 mg via INTRAVENOUS

## 2020-08-07 MED ORDER — FUROSEMIDE 40 MG PO TABS
40.0000 mg | ORAL_TABLET | Freq: Two times a day (BID) | ORAL | Status: DC
  Administered 2020-08-07 – 2020-08-08 (×2): 40 mg via ORAL
  Filled 2020-08-07 (×2): qty 1

## 2020-08-07 MED ORDER — MIDAZOLAM HCL 2 MG/2ML IJ SOLN
INTRAMUSCULAR | Status: AC
  Filled 2020-08-07: qty 2

## 2020-08-07 MED ORDER — CLOPIDOGREL BISULFATE 75 MG PO TABS
75.0000 mg | ORAL_TABLET | Freq: Every day | ORAL | Status: DC
  Administered 2020-08-07 – 2020-08-08 (×2): 75 mg via ORAL
  Filled 2020-08-07 (×2): qty 1

## 2020-08-07 MED ORDER — HYDRALAZINE HCL 20 MG/ML IJ SOLN
INTRAMUSCULAR | Status: AC | PRN
  Administered 2020-08-07: 10 mg via INTRAVENOUS

## 2020-08-07 MED ORDER — ASPIRIN EC 81 MG PO TBEC
81.0000 mg | DELAYED_RELEASE_TABLET | Freq: Every day | ORAL | Status: DC
  Administered 2020-08-07 – 2020-08-08 (×2): 81 mg via ORAL
  Filled 2020-08-07 (×2): qty 1

## 2020-08-07 MED ORDER — HYDRALAZINE HCL 20 MG/ML IJ SOLN
INTRAMUSCULAR | Status: AC
  Filled 2020-08-07: qty 1

## 2020-08-07 NOTE — Progress Notes (Signed)
Patient ID: Samuel Butler, male   DOB: 1961/03/25, 59 y.o.   MRN: [id number] [facility] Progress Note   Assessment/ Plan:   1.  Nephrotic syndrome: With anasarca and significant proteinuria/hypoalbuminemia.  Status post renal biopsy today with preliminary serologies showing positive PR-3 and hepatitis B surface antigen.  Clinical index of suspicion that this is likely membranous nephropathy versus FSGS.  Underwent renal biopsy earlier today by IR to decide on definitive management based on diagnosis.  2.  Anasarca: Secondary to nephrotic syndrome as well as history of diastolic heart failure/liver disease.  Ongoing diuresis with furosemide-now 40 mg p.o. twice daily and he denies having any orthopnea or exertional dyspnea. 3.  Hypertension: On antihypertensive therapy including losartan, blood pressures permissive. 4.  Anemia: Mild, monitor with ongoing diuresis/status post renal biopsy.  Subjective:   Reports to be feeling well, tolerated kidney biopsy earlier today without any problems.   Objective:   BP (!) 137/92   Pulse 86   Temp 98 F (36.7 C) (Oral)   Resp 16   Ht 5\' 11"  (1.803 m)   Wt 110.7 kg   SpO2 96%   BMI 34.04 kg/m   Intake/Output Summary (Last 24 hours) at 08/07/2020 1025 Last data filed at 08/07/2020 0900 Gross per 24 hour  Intake 450 ml  Output 400 ml  Net 50 ml   Weight change:   Physical Exam: Gen: Comfortably resting in bed, wife at bedside CVS: Pulse regular rhythm, normal rate, S1 and S2 normal Resp: Clear to auscultation, no rales/rhonchi Abd: Soft, flat, nontender, bowel sounds normal Ext: Trace lower extremity edema restricted to ankles  Imaging: ECHOCARDIOGRAM COMPLETE  Result Date: 08/05/2020    ECHOCARDIOGRAM REPORT   Patient Name:   Samuel Butler Date of Exam: 08/05/2020 Medical Rec #:  [id number]       Height:       71.0 in Accession #:    [id number]      Weight:       245.4 lb Date of Birth:  August 08, 1961       BSA:          2.300  m Patient Age:    20 years        BP:           115/87 mmHg Patient Gender: M               HR:           94 bpm. Exam Location:  Inpatient Procedure: 2D Echo, Cardiac Doppler and Color Doppler Indications:    Dyspnea  History:        Patient has prior history of Echocardiogram examinations, most                 recent 03/03/2020. PAD, Signs/Symptoms:Shortness of Breath and LE                 edema; Risk Factors:Hypertension, Dyslipidemia, Diabetes and                 Current Smoker.  Sonographer:    Dustin Flock Referring Phys: Fletcher  1. Left ventricular ejection fraction, by estimation, is 65 to 70%. The left ventricle has normal function. The left ventricle has no regional wall motion abnormalities. There is severe concentric left ventricular hypertrophy. Left ventricular diastolic  parameters are consistent with Grade I diastolic dysfunction (impaired relaxation). Elevated left ventricular end-diastolic pressure.  2. Right ventricular systolic function is  normal. The right ventricular size is normal. There is moderately elevated pulmonary artery systolic pressure.  3. Left atrial size was mildly dilated.  4. The mitral valve is grossly normal. Trivial mitral valve regurgitation.  5. The aortic valve is tricuspid. Aortic valve regurgitation is mild. Aortic regurgitation PHT measures 440 msec.  6. Aortic dilatation noted. There is mild dilatation of the ascending aorta, measuring 40 mm.  7. The inferior vena cava is dilated in size with <50% respiratory variability, suggesting right atrial pressure of 15 mmHg. Comparison(s): Changes from prior study are noted. 03/03/20: LVEF 65-70%, grade 1 DD, elevated LA pressue, RVSP 33 mmHg. FINDINGS  Left Ventricle: Left ventricular ejection fraction, by estimation, is 65 to 70%. The left ventricle has normal function. The left ventricle has no regional wall motion abnormalities. The left ventricular internal cavity size was normal in size. There  is  severe concentric left ventricular hypertrophy. Left ventricular diastolic parameters are consistent with Grade I diastolic dysfunction (impaired relaxation). Elevated left ventricular end-diastolic pressure. Right Ventricle: The right ventricular size is normal. No increase in right ventricular wall thickness. Right ventricular systolic function is normal. There is moderately elevated pulmonary artery systolic pressure. The tricuspid regurgitant velocity is 2.76 m/s, and with an assumed right atrial pressure of 15 mmHg, the estimated right ventricular systolic pressure is 14.9 mmHg. Left Atrium: Left atrial size was mildly dilated. Right Atrium: Right atrial size was normal in size. Pericardium: There is no evidence of pericardial effusion. Mitral Valve: The mitral valve is grossly normal. Trivial mitral valve regurgitation. Tricuspid Valve: The tricuspid valve is grossly normal. Tricuspid valve regurgitation is mild. Aortic Valve: The aortic valve is tricuspid. Aortic valve regurgitation is mild. Aortic regurgitation PHT measures 440 msec. Pulmonic Valve: The pulmonic valve was normal in structure. Pulmonic valve regurgitation is not visualized. Aorta: Aortic dilatation noted. There is mild dilatation of the ascending aorta, measuring 40 mm. Venous: The inferior vena cava is dilated in size with less than 50% respiratory variability, suggesting right atrial pressure of 15 mmHg. IAS/Shunts: No atrial level shunt detected by color flow Doppler.  LEFT VENTRICLE PLAX 2D LVIDd:         3.50 cm  Diastology LVIDs:         2.70 cm  LV e' medial:    4.35 cm/s LV PW:         1.70 cm  LV E/e' medial:  21.5 LV IVS:        1.70 cm  LV e' lateral:   5.22 cm/s LVOT diam:     2.50 cm  LV E/e' lateral: 17.9 LV SV:         74 LV SV Index:   32 LVOT Area:     4.91 cm  RIGHT VENTRICLE RV Basal diam:  3.20 cm RV S prime:     17.40 cm/s TAPSE (M-mode): 2.7 cm LEFT ATRIUM             Index       RIGHT ATRIUM           Index LA  diam:        3.20 cm 1.39 cm/m  RA Area:     21.60 cm LA Vol (A2C):   49.4 ml 21.48 ml/m RA Volume:   71.80 ml  31.22 ml/m LA Vol (A4C):   28.5 ml 12.39 ml/m LA Biplane Vol: 41.3 ml 17.96 ml/m  AORTIC VALVE LVOT Vmax:   102.00 cm/s LVOT Vmean:  59.100 cm/s  LVOT VTI:    0.151 m AI PHT:      440 msec  AORTA Ao Root diam: 3.90 cm MITRAL VALVE               TRICUSPID VALVE MV Area (PHT): 5.38 cm    TR Peak grad:   30.5 mmHg MV Decel Time: 141 msec    TR Vmax:        276.00 cm/s MV E velocity: 93.40 cm/s MV A velocity: 82.70 cm/s  SHUNTS MV E/A ratio:  1.13        Systemic VTI:  0.15 m                            Systemic Diam: 2.50 cm Lyman Bishop MD Electronically signed by Lyman Bishop MD Signature Date/Time: 08/05/2020/12:16:16 PM    Final     Labs: BMET Recent Labs  Lab 08/03/20 1618 08/04/20 0859 08/05/20 0809 08/06/20 0417 08/07/20 0543  NA 141 139 139 139 141  K 3.5 3.7 4.0 3.6 3.5  CL 103 101 101 104 104  CO2 28 24 25 27 28   GLUCOSE 128* 179* 266* 207* 152*  BUN 12 11 28* 31* 31*  CREATININE 1.27* 1.15 1.34* 1.30* 1.22  CALCIUM 7.8* 7.8*  7.6* 7.7* 7.6* 7.5*  PHOS  --  5.2* 4.2 3.2 3.1   CBC Recent Labs  Lab 08/03/20 1618 08/04/20 0859 08/05/20 0809 08/07/20 0543  WBC 8.2 6.4 14.8* 7.6  HGB 12.7* 12.7* 12.1* 11.3*  HCT 39.2 38.4* 36.3* 35.2*  MCV 95.1 91.2 91.7 93.1  PLT 268 303 360 352    Medications:    . atorvastatin  80 mg Oral Daily  . dapagliflozin propanediol  10 mg Oral Daily  . enoxaparin (LOVENOX) injection  40 mg Subcutaneous Q24H  . fentaNYL      . furosemide  40 mg Intravenous BID  . gelatin adsorbable      . hydrALAZINE      . insulin aspart  0-5 Units Subcutaneous QHS  . insulin aspart  0-9 Units Subcutaneous TID WC  . isosorbide mononitrate  30 mg Oral Daily  . lidocaine (PF)      . losartan  50 mg Oral Daily  . midazolam      . pantoprazole  40 mg Oral Daily  . predniSONE  40 mg Oral Q breakfast  . Vitamin D (Ergocalciferol)  50,000  Units Oral Q7 days   Elmarie Shiley, MD 08/07/2020, 10:25 AM

## 2020-08-07 NOTE — Procedures (Signed)
Interventional Radiology Procedure Note  Procedure: Ultrasound guided renal biopsy  Findings: Please refer to procedural dictation for full description. Non-focal biopsy of right inferior pole. 15 ga introducer and 2, 16 ga samples obtained.  Gelfoam slurry tract embolization.  10 mg hydralazine given IV during case for hypertension.  Complications: None immediate  Estimated Blood Loss: <10 mL  Recommendations: Bedrest and NPO for 4 hours. OK to resume prior diet once bedrest is complete. Follow up Pathology results.   Ruthann Cancer, MD

## 2020-08-07 NOTE — Progress Notes (Signed)
Progress Note    Samuel Butler  YKD:[id number] DOB: 31-Oct-1961  DOA: 08/03/2020 PCP: Isaac Bliss, Rayford Halsted, MD    Brief Narrative:     Medical records reviewed and are as summarized below:  Samuel Butler is an 59 y.o. male with a PMH significant for longstanding HTN, DM type 2 (20 years), PAD s/p right SFA stenting, HLD, h/o hepatitis (not sure which but did receive treatment), h/o polysubstance abuse, and HLD who was sent to [facility] ED with worsening lower extremity edema, orthopnea, and PND as well as worsening arthralgias of his wrists.   Assessment/Plan:   Active Problems:   Essential hypertension, benign   Hyperlipemia   Gout   Diet-controlled type 2 diabetes mellitus (HCC)   Tobacco use disorder   Hypoalbuminemia   Peripheral arterial disease (HCC)   Hypokalemia   AKI (acute kidney injury) ([place])   Anasarca   anasarca:  ? related to renal dsfn?  Patient also has cirrhosis and diastolic heart failure per prior.  No obvious CHF on chest x-ray.     -nephrology consult appreciated plan for 24-hour urine with increased protein -IV diuresis changed to PO --HepC/UPEP/SPEP pending -echo shows worsening LVH: ? From volume overload? -IR consulted for biopsy  right elbow swelling and tenderness: Appears to be possibly due to gout versus migratory arthritis.   -IV steroids- wean off slowly  CKD stage 2 per renal  Diabetes:  -Sliding scale insulin. -hold metformin -Monitor closely while on steroids  hypertension:  -monitor closely, avoid hypotension   tobacco use disorder:  -encouraged cessation  obesity Body mass index is 34.04 kg/m.   Family Communication/Anticipated D/C date and plan/Code Status   DVT prophylaxis: Lovenox ordered. Code Status: Full Code.  Disposition Plan: Status is: Inpatient Family at bedside Remains inpatient appropriate because:Inpatient level of care appropriate due to severity of illness   Dispo: The patient is from:  Home              Anticipated d/c is to: Home              Anticipated d/c date is: > 3 days              Patient currently is not medically stable to d/c.         Medical Consultants:   Nephrology   Subjective:   Just back from IR-- doing well  Objective:  Blood pressure (!) 146/92, pulse 88, temperature 99.5 F (37.5 C), temperature source Oral, resp. rate 18, height 5\' 11"  (1.803 m), weight 110.7 kg, SpO2 97 %.    Intake/Output Summary (Last 24 hours) at 08/07/2020 1153 Last data filed at 08/07/2020 0900 Gross per 24 hour  Intake 450 ml  Output 400 ml  Net 50 ml   Filed Weights   08/03/20 2215 08/04/20 2045 08/05/20 2200  Weight: 108.4 kg 111.3 kg 110.7 kg    Exam:  General: Appearance:    Obese male in no acute distress     Lungs:     respirations unlabored  Heart:    Normal heart rate. Normal rhythm. No murmurs, rubs, or gallops.   MS:   All extremities are intact.   Neurologic:   Awake, alert, oriented x 3. No apparent focal neurological           defect.        Data Reviewed:   I have personally reviewed following labs and imaging studies:  Labs: Labs show the following:  Basic Metabolic Panel: Recent Labs  Lab 08/03/20 1618 08/03/20 1618 08/04/20 0859 08/04/20 0859 08/05/20 0809 08/05/20 0809 08/06/20 0417 08/07/20 0543  NA 141  --  139  --  139  --  139 141  K 3.5   < > 3.7   < > 4.0   < > 3.6 3.5  CL 103  --  101  --  101  --  104 104  CO2 28  --  24  --  25  --  27 28  GLUCOSE 128*  --  179*  --  266*  --  207* 152*  BUN 12  --  11  --  28*  --  31* 31*  CREATININE 1.27*  --  1.15  --  1.34*  --  1.30* 1.22  CALCIUM 7.8*  --  7.8*  7.6*  --  7.7*  --  7.6* 7.5*  PHOS  --   --  5.2*  --  4.2  --  3.2 3.1   < > = values in this interval not displayed.   GFR Estimated Creatinine Clearance: 81.5 mL/min (by C-G formula based on SCr of 1.22 mg/dL). Liver Function Tests: Recent Labs  Lab 08/04/20 0859 08/05/20 0809  08/06/20 0417 08/07/20 0543  ALBUMIN 1.3* 1.1* 1.1* 1.1*   No results for input(s): LIPASE, AMYLASE in the last 168 hours. No results for input(s): AMMONIA in the last 168 hours. Coagulation profile Recent Labs  Lab 08/07/20 0543  INR 1.0    CBC: Recent Labs  Lab 08/03/20 1618 08/04/20 0859 08/05/20 0809 08/07/20 0543  WBC 8.2 6.4 14.8* 7.6  HGB 12.7* 12.7* 12.1* 11.3*  HCT 39.2 38.4* 36.3* 35.2*  MCV 95.1 91.2 91.7 93.1  PLT 268 303 360 352   Cardiac Enzymes: No results for input(s): CKTOTAL, CKMB, CKMBINDEX, TROPONINI in the last 168 hours. BNP (last 3 results) No results for input(s): PROBNP in the last 8760 hours. CBG: Recent Labs  Lab 08/06/20 1123 08/06/20 1622 08/06/20 2040 08/07/20 0648 08/07/20 1148  GLUCAP 113* 176* 183* 122* 97   D-Dimer: No results for input(s): DDIMER in the last 72 hours. Hgb A1c: No results for input(s): HGBA1C in the last 72 hours. Lipid Profile: Recent Labs    08/06/20 0417  CHOL 255*  HDL 49  LDLCALC 191*  TRIG 74  CHOLHDL 5.2   Thyroid function studies: No results for input(s): TSH, T4TOTAL, T3FREE, THYROIDAB in the last 72 hours.  Invalid input(s): FREET3 Anemia work up: No results for input(s): VITAMINB12, FOLATE, FERRITIN, TIBC, IRON, RETICCTPCT in the last 72 hours. Sepsis Labs: Recent Labs  Lab 08/03/20 1618 08/04/20 0859 08/05/20 0809 08/07/20 0543  WBC 8.2 6.4 14.8* 7.6    Microbiology Recent Results (from the past 240 hour(s))  Respiratory Panel by RT PCR (Flu A&B, Covid) - Nasopharyngeal Swab     Status: None   Collection Time: 08/03/20  6:10 PM   Specimen: Nasopharyngeal Swab  Result Value Ref Range Status   SARS Coronavirus 2 by RT PCR NEGATIVE NEGATIVE Final    Comment: (NOTE) SARS-CoV-2 target nucleic acids are NOT DETECTED.  The SARS-CoV-2 RNA is generally detectable in upper respiratoy specimens during the acute phase of infection. The lowest concentration of SARS-CoV-2 viral copies  this assay can detect is 131 copies/mL. A negative result does not preclude SARS-Cov-2 infection and should not be used as the sole basis for treatment or other patient management decisions. A negative result may occur with  improper specimen collection/handling, submission of specimen other than nasopharyngeal swab, presence of viral mutation(s) within the areas targeted by this assay, and inadequate number of viral copies (<131 copies/mL). A negative result must be combined with clinical observations, patient history, and epidemiological information. The expected result is Negative.  Fact Sheet for Patients:  PinkCheek.be  Fact Sheet for Healthcare Providers:  GravelBags.it  This test is no t yet approved or cleared by the [place] FDA and  has been authorized for detection and/or diagnosis of SARS-CoV-2 by FDA under an Emergency Use Authorization (EUA). This EUA will remain  in effect (meaning this test can be used) for the duration of the COVID-19 declaration under Section 564(b)(1) of the Act, 21 U.S.C. section 360bbb-3(b)(1), unless the authorization is terminated or revoked sooner.     Influenza A by PCR NEGATIVE NEGATIVE Final   Influenza B by PCR NEGATIVE NEGATIVE Final    Comment: (NOTE) The Xpert Xpress SARS-CoV-2/FLU/RSV assay is intended as an aid in  the diagnosis of influenza from Nasopharyngeal swab specimens and  should not be used as a sole basis for treatment. Nasal washings and  aspirates are unacceptable for Xpert Xpress SARS-CoV-2/FLU/RSV  testing.  Fact Sheet for Patients: PinkCheek.be  Fact Sheet for Healthcare Providers: GravelBags.it  This test is not yet approved or cleared by the [place] FDA and  has been authorized for detection and/or diagnosis of SARS-CoV-2 by  FDA under an Emergency Use Authorization (EUA). This EUA  will remain  in effect (meaning this test can be used) for the duration of the  Covid-19 declaration under Section 564(b)(1) of the Act, 21  U.S.C. section 360bbb-3(b)(1), unless the authorization is  terminated or revoked. Performed at [facility], [place] [address] [place], [place] [address]     Procedures and diagnostic studies:  US BIOPSY (KIDNEY)  Result Date: 08/07/2020 INDICATION: 59 year old male with history of nephrotic syndrome. EXAM: ULTRASOUND GUIDED RENAL BIOPSY COMPARISON:  None. MEDICATIONS: Hydralazine, 10 mg intravenous ANESTHESIA/SEDATION: Moderate (conscious) sedation was employed during this procedure. A total of Versed 2 mg and Fentanyl 75 mcg was administered intravenously. Moderate Sedation Time: 15 minutes. The patient's level of consciousness and vital signs were monitored continuously by radiology nursing throughout the procedure under my direct supervision. COMPLICATIONS: None immediate. PROCEDURE: Informed written consent was obtained from the patient after a discussion of the risks, benefits and alternatives to treatment. The patient understands and consents the procedure. A timeout was performed prior to the initiation of the procedure. Ultrasound scanning was performed of the bilateral flanks. The inferior pole of the right kidney was selected for biopsy due to location and sonographic window. The procedure was planned. The operative site was prepped and draped in the usual sterile fashion. The overlying soft tissues were anesthetized with 1% lidocaine with epinephrine. A 15 gauge trocar introducer needle was advanced into the inferior cortex of the right kidney. Next, a 31 gauge core needle biopsy device was advanced into the inferior cortex of the right kidney and 2 core biopsies were obtained under direct ultrasound guidance. Real time pathologic review confirmed adequate tissue acquisition. Images were saved for documentation purposes. The introducer needle was  removed during injection of Gel-Foam slurry and hemostasis was obtained with manual compression. Post procedural scanning was negative for significant post procedural hemorrhage or additional complication. A dressing was placed. The patient tolerated the procedure well without immediate post procedural complication. IMPRESSION: Technically successful ultrasound guided right renal biopsy. Electronically Signed  By: Ruthann Cancer MD   On: 08/07/2020 10:37    Medications:   . atorvastatin  80 mg Oral Daily  . dapagliflozin propanediol  10 mg Oral Daily  . enoxaparin (LOVENOX) injection  40 mg Subcutaneous Q24H  . fentaNYL      . furosemide  40 mg Intravenous BID  . gelatin adsorbable      . hydrALAZINE      . insulin aspart  0-5 Units Subcutaneous QHS  . insulin aspart  0-9 Units Subcutaneous TID WC  . isosorbide mononitrate  30 mg Oral Daily  . lidocaine (PF)      . losartan  50 mg Oral Daily  . midazolam      . pantoprazole  40 mg Oral Daily  . predniSONE  40 mg Oral Q breakfast  . Vitamin D (Ergocalciferol)  50,000 Units Oral Q7 days   Continuous Infusions:   LOS: 4 days   Geradine Girt  [facility]   How to contact the [facility] Attending or Consulting provider [place] or covering provider during after hours [place], for this patient?  1. Check the care team in [facility] and look for a) attending/consulting [facility] provider listed and b) the [facility] team listed 2. Log into [email or web address] and use [place]'s universal password to access. If you do not have the password, please contact the hospital operator. 3. Locate the [facility] provider you are looking for under [facility] and page to a number that you can be directly reached. 4. If you still have difficulty reaching the provider, please page the [facility] (Director on Call) for the Hospitalists listed on amion for assistance.  08/07/2020, 11:53 AM

## 2020-08-07 NOTE — Plan of Care (Signed)
  Problem: Health Behavior/Discharge Planning: Goal: Ability to manage health-related needs will improve Outcome: Progressing   Problem: Coping: Goal: Level of anxiety will decrease Outcome: Progressing   

## 2020-08-08 ENCOUNTER — Ambulatory Visit (HOSPITAL_COMMUNITY): Payer: BC Managed Care – PPO

## 2020-08-08 DIAGNOSIS — R739 Hyperglycemia, unspecified: Secondary | ICD-10-CM

## 2020-08-08 LAB — CBC
HCT: 38.4 % — ABNORMAL LOW (ref 39.0–52.0)
Hemoglobin: 12.3 g/dL — ABNORMAL LOW (ref 13.0–17.0)
MCH: 29.9 pg (ref 26.0–34.0)
MCHC: 32 g/dL (ref 30.0–36.0)
MCV: 93.2 fL (ref 80.0–100.0)
Platelets: 384 10*3/uL (ref 150–400)
RBC: 4.12 MIL/uL — ABNORMAL LOW (ref 4.22–5.81)
RDW: 15.4 % (ref 11.5–15.5)
WBC: 6.7 10*3/uL (ref 4.0–10.5)
nRBC: 0 % (ref 0.0–0.2)

## 2020-08-08 LAB — RENAL FUNCTION PANEL
Albumin: 1 g/dL — ABNORMAL LOW (ref 3.5–5.0)
Anion gap: 8 (ref 5–15)
BUN: 28 mg/dL — ABNORMAL HIGH (ref 6–20)
CO2: 31 mmol/L (ref 22–32)
Calcium: 7.5 mg/dL — ABNORMAL LOW (ref 8.9–10.3)
Chloride: 103 mmol/L (ref 98–111)
Creatinine, Ser: 1.21 mg/dL (ref 0.61–1.24)
GFR calc Af Amer: >60 mL/min (ref >60)
GFR calc non Af Amer: >60 mL/min (ref >60)
Glucose, Bld: 140 mg/dL — ABNORMAL HIGH (ref 70–99)
Phosphorus: 3.5 mg/dL (ref 2.5–4.6)
Potassium: 3.5 mmol/L (ref 3.5–5.1)
Sodium: 142 mmol/L (ref 135–145)

## 2020-08-08 LAB — GLUCOSE, CAPILLARY
Glucose-Capillary: 117 mg/dL — ABNORMAL HIGH (ref 70–99)
Glucose-Capillary: 128 mg/dL — ABNORMAL HIGH (ref 70–99)

## 2020-08-08 MED ORDER — PREDNISONE 10 MG PO TABS: ORAL_TABLET | ORAL | 0 refills | Status: DC

## 2020-08-08 MED ORDER — POTASSIUM CHLORIDE CRYS ER 20 MEQ PO TBCR: 20.0000 meq | EXTENDED_RELEASE_TABLET | Freq: Every day | ORAL | 0 refills | Status: DC

## 2020-08-08 MED ORDER — FUROSEMIDE 40 MG PO TABS
40.0000 mg | ORAL_TABLET | Freq: Two times a day (BID) | ORAL | 0 refills | Status: DC
Start: 2020-08-08 — End: 2020-08-23

## 2020-08-08 MED FILL — predniSONE 10 MG TABS: 10 | 7 days supply | Qty: 16 | Fill #0

## 2020-08-08 MED FILL — FUROSEMIDE 40 MG TABLET: 40 | 30 days supply | Qty: 60 | Fill #0

## 2020-08-08 MED FILL — POTASSIUM CHLORIDE 20meqER: 20 | 30 days supply | Qty: 30 | Fill #0

## 2020-08-08 NOTE — Discharge Summary (Signed)
Physician Discharge Summary  Samuel Butler QQV:[id number] DOB: 25-Oct-1961 DOA: 08/03/2020  PCP: Samuel Butler, Samuel Halsted, MD  Admit date: 08/03/2020 Discharge date: 08/08/2020  Admitted From: home Discharge disposition: home   Recommendations for Outpatient Follow-Up:   1. BMP 1 week 2. May need adjustment of his lasix dose-- 40 mg BID 3. Kidney biopsy results pending-- renal to follow up results 4. Steroids being weaned to off- patient to monitor his blood sugars closely in the meantime    Discharge Diagnosis:   Active Problems:   Essential hypertension, benign   Hyperlipemia   Gout   Diet-controlled type 2 diabetes mellitus ([place])   Tobacco use disorder   Hypoalbuminemia   Peripheral arterial disease (HCC)   Hypokalemia   AKI (acute kidney injury) ([place])   Anasarca    Discharge Condition: Improved.  Diet recommendation: Low sodium, heart healthy.  Carbohydrate-modified  Wound care: None.  Code status: Full.   History of Present Illness:   Samuel Butler is a 59 y.o. male with medical history significant of nephrotic syndrome apparently being followed by nephrology.  Patient has been scheduled for renal biopsy, hypertension, hyperlipidemia, diabetes, gout, inflammatory bowel disease and tobacco abuse who presented to the ER with shortness of breath bilateral lower extremity swelling as well as right elbow swelling and tenderness.  Patient was seen and evaluated.  He appears to have worsening edema but renal function slightly worsened.  He also has had worsening shortness of breath over his baseline.  His weight has also been increased.  He was on prednisone apparently outpatient for the treatment of his nephrotic syndrome.  Once he was tapered off he started having migratory arthralgias and the worsening edema.  His diuretic was increased a week earlier but does not seem to help with the swelling.  His right elbow is swollen tender and not able to extend it.   He has had history of gout and thinks he may be having a flareup.  Patient will be admitted with nephrology follow-up.Marland Kitchen   Hospital Course by Problem:   anasarca:Clinical index of suspicion that this is likely membranous nephropathy versus FSGS.  ? related to renal dsfn?  Patient also has cirrhosis and diastolic heart failure per prior. No obvious CHF on chest x-ray.    -IV diuresis changed to PO but may need further titration outpatient -echo shows worsening LVH: ? From volume overload?- cards follow up -s/p  Biopsy: results should be available in October 1st- has follow up with Dr. Candiss Butler early october  right elbow swelling and tenderness:Appears to be possibly due to gout versus migratory arthritis.  -IV steroids- wean to off  CKD stage 2 per renal  Diabetes: -Monitor closely while on steroids -resume home meds  hypertension: -monitor closely, avoid hypotension -continue ARB   tobacco use disorder: -encouraged cessation  obesity Body mass index is 34.04 kg/m.    Medical Consultants:   Renal IR  Discharge Exam:   Vitals:   08/08/20 0447 08/08/20 0913  BP: 128/90 128/90  Pulse: 72 78  Resp: 14 18  Temp: 97.7 F (36.5 C) 98.7 F (37.1 C)  SpO2: 97% 97%   Vitals:   08/07/20 1715 08/07/20 2059 08/08/20 0447 08/08/20 0913  BP: 116/71 115/86 128/90 128/90  Pulse: (!) 104 (!) 101 72 78  Resp: 18 18 14 18   Temp: 99.3 F (37.4 C) 99.5 F (37.5 C) 97.7 F (36.5 C) 98.7 F (37.1 C)  TempSrc: Oral Oral Oral  SpO2: 97% 98% 97% 97%  Weight:  110.3 kg    Height:        General exam: Appears calm and comfortable.     The results of significant diagnostics from this hospitalization (including imaging, microbiology, ancillary and laboratory) are listed below for reference.     Procedures and Diagnostic Studies:   DG Chest 2 View  Result Date: 08/03/2020 CLINICAL DATA:  Shortness of breath, leg swelling EXAM: CHEST - 2 VIEW COMPARISON:  Chest  x-ray 12/18/2001 report without images. FINDINGS: The heart size and mediastinal contours are within normal limits. Bibasilar linear atelectasis. No focal consolidation. No pulmonary edema. No pleural effusion. No pneumothorax. No acute osseous abnormality. IMPRESSION: No active cardiopulmonary disease. Electronically Signed   By: Iven Finn M.D.   On: 08/03/2020 18:06   VAS [place] LOWER EXTREMITY VENOUS (DVT) (ONLY [facility] & [facility] 7a-7p)  Result Date: 08/04/2020  Lower Venous DVTStudy Indications: Bilateral lower extremity pitting edema.  Comparison Study: No prior study Performing Technologist: Maudry Mayhew MHA, RDMS, RVT, RDCS  Examination Guidelines: A complete evaluation includes B-mode imaging, spectral Doppler, color Doppler, and power Doppler as needed of all accessible portions of each vessel. Bilateral testing is considered an integral part of a complete examination. Limited examinations for reoccurring indications may be performed as noted. The reflux portion of the exam is performed with the patient in reverse Trendelenburg.  +---------+---------------+---------+-----------+----------+--------------+ RIGHT    CompressibilityPhasicitySpontaneityPropertiesThrombus Aging +---------+---------------+---------+-----------+----------+--------------+ CFV      Full           Yes      Yes                                 +---------+---------------+---------+-----------+----------+--------------+ SFJ      Full           Yes      Yes                                 +---------+---------------+---------+-----------+----------+--------------+ FV Prox  Full           Yes      Yes                                 +---------+---------------+---------+-----------+----------+--------------+ FV Mid   Full           Yes      Yes                                 +---------+---------------+---------+-----------+----------+--------------+ FV DistalFull                                                         +---------+---------------+---------+-----------+----------+--------------+ PFV      Full                                                        +---------+---------------+---------+-----------+----------+--------------+ POP      Full                                                        +---------+---------------+---------+-----------+----------+--------------+  PTV      Full                                                        +---------+---------------+---------+-----------+----------+--------------+ PERO     Full                                                        +---------+---------------+---------+-----------+----------+--------------+   +---------+---------------+---------+-----------+----------+--------------+ LEFT     CompressibilityPhasicitySpontaneityPropertiesThrombus Aging +---------+---------------+---------+-----------+----------+--------------+ CFV      Full           Yes      Yes                                 +---------+---------------+---------+-----------+----------+--------------+ SFJ      Full                                                        +---------+---------------+---------+-----------+----------+--------------+ FV Prox  Full                                                        +---------+---------------+---------+-----------+----------+--------------+ FV Mid   Full                                                        +---------+---------------+---------+-----------+----------+--------------+ FV DistalFull                                                        +---------+---------------+---------+-----------+----------+--------------+ PFV      Full                                                        +---------+---------------+---------+-----------+----------+--------------+ POP      Full           Yes      Yes                                  +---------+---------------+---------+-----------+----------+--------------+ PTV      Full                                                        +---------+---------------+---------+-----------+----------+--------------+  PERO     Full                                                        +---------+---------------+---------+-----------+----------+--------------+     Summary: RIGHT: - There is no evidence of deep vein thrombosis in the lower extremity.  - No cystic structure found in the popliteal fossa.  LEFT: - There is no evidence of deep vein thrombosis in the lower extremity.  - No cystic structure found in the popliteal fossa.  *See table(s) above for measurements and observations. Electronically signed by Deitra Mayo MD on 08/04/2020 at 10:14:37 AM.    Final    UE VENOUS DUPLEX ([facility] 7 am - 7 pm)  Result Date: 08/04/2020 UPPER VENOUS STUDY  Indications: Pain, and edema x3 days. Limitations: Patient position; restricted mobility. Comparison Study: No prior study Performing Technologist: Maudry Mayhew MHA, RDMS, RVT, RDCS  Examination Guidelines: A complete evaluation includes B-mode imaging, spectral Doppler, color Doppler, and power Doppler as needed of all accessible portions of each vessel. Bilateral testing is considered an integral part of a complete examination. Limited examinations for reoccurring indications may be performed as noted.  Right Findings: +----------+------------+---------+-----------+----------+-------+ RIGHT     CompressiblePhasicitySpontaneousPropertiesSummary +----------+------------+---------+-----------+----------+-------+ IJV           Full       Yes       Yes                      +----------+------------+---------+-----------+----------+-------+ Subclavian    Full       Yes       Yes                      +----------+------------+---------+-----------+----------+-------+ Axillary      Full       Yes       Yes                       +----------+------------+---------+-----------+----------+-------+ Brachial      Full       Yes       Yes                      +----------+------------+---------+-----------+----------+-------+ Radial        Full                                          +----------+------------+---------+-----------+----------+-------+ Ulnar         Full                                          +----------+------------+---------+-----------+----------+-------+ Cephalic      Full                                          +----------+------------+---------+-----------+----------+-------+ Basilic       Full                                          +----------+------------+---------+-----------+----------+-------+  Left Findings: +----------+------------+---------+-----------+----------+--------------+ LEFT      CompressiblePhasicitySpontaneousProperties   Summary     +----------+------------+---------+-----------+----------+--------------+ Subclavian                                          Not visualized +----------+------------+---------+-----------+----------+--------------+  Summary:  Right: No evidence of deep vein thrombosis in the upper extremity. No evidence of superficial vein thrombosis in the upper extremity.  *See table(s) above for measurements and observations.  Diagnosing physician: Deitra Mayo MD Electronically signed by Deitra Mayo MD on 08/04/2020 at 10:14:49 AM.    Final      Labs:   Basic Metabolic Panel: Recent Labs  Lab 08/04/20 0859 08/04/20 0859 08/05/20 0809 08/05/20 0809 08/06/20 0417 08/06/20 0417 08/07/20 0543 08/08/20 0502  NA 139  --  139  --  139  --  141 142  K 3.7   < > 4.0   < > 3.6   < > 3.5 3.5  CL 101  --  101  --  104  --  104 103  CO2 24  --  25  --  27  --  28 31  GLUCOSE 179*  --  266*  --  207*  --  152* 140*  BUN 11  --  28*  --  31*  --  31* 28*  CREATININE 1.15  --  1.34*  --  1.30*  --  1.22 1.21   CALCIUM 7.8*  7.6*  --  7.7*  --  7.6*  --  7.5* 7.5*  PHOS 5.2*  --  4.2  --  3.2  --  3.1 3.5   < > = values in this interval not displayed.   GFR Estimated Creatinine Clearance: 82 mL/min (by C-G formula based on SCr of 1.21 mg/dL). Liver Function Tests: Recent Labs  Lab 08/04/20 0859 08/05/20 0809 08/06/20 0417 08/07/20 0543 08/08/20 0502  ALBUMIN 1.3* 1.1* 1.1* 1.1* 1.0*   No results for input(s): LIPASE, AMYLASE in the last 168 hours. No results for input(s): AMMONIA in the last 168 hours. Coagulation profile Recent Labs  Lab 08/07/20 0543  INR 1.0    CBC: Recent Labs  Lab 08/03/20 1618 08/04/20 0859 08/05/20 0809 08/07/20 0543 08/08/20 0908  WBC 8.2 6.4 14.8* 7.6 6.7  HGB 12.7* 12.7* 12.1* 11.3* 12.3*  HCT 39.2 38.4* 36.3* 35.2* 38.4*  MCV 95.1 91.2 91.7 93.1 93.2  PLT 268 303 360 352 384   Cardiac Enzymes: No results for input(s): CKTOTAL, CKMB, CKMBINDEX, TROPONINI in the last 168 hours. BNP: Invalid input(s): POCBNP CBG: Recent Labs  Lab 08/07/20 1148 08/07/20 1713 08/07/20 2059 08/08/20 0636 08/08/20 1112  GLUCAP 97 239* 171* 117* 128*   D-Dimer No results for input(s): DDIMER in the last 72 hours. Hgb A1c No results for input(s): HGBA1C in the last 72 hours. Lipid Profile Recent Labs    08/06/20 0417  CHOL 255*  HDL 49  LDLCALC 191*  TRIG 74  CHOLHDL 5.2   Thyroid function studies No results for input(s): TSH, T4TOTAL, T3FREE, THYROIDAB in the last 72 hours.  Invalid input(s): FREET3 Anemia work up No results for input(s): VITAMINB12, FOLATE, FERRITIN, TIBC, IRON, RETICCTPCT in the last 72 hours. Microbiology Recent Results (from the past 240 hour(s))  Respiratory Panel by RT PCR (Flu A&B, Covid) - Nasopharyngeal Swab     Status: None   Collection Time: 08/03/20  6:10 PM   Specimen: Nasopharyngeal Swab  Result Value Ref Range Status   SARS Coronavirus 2 by RT PCR NEGATIVE NEGATIVE Final    Comment: (NOTE) SARS-CoV-2  target nucleic acids are NOT DETECTED.  The SARS-CoV-2 RNA is generally detectable in upper respiratoy specimens during the acute phase of infection. The lowest concentration of SARS-CoV-2 viral copies this assay can detect is 131 copies/mL. A negative result does not preclude SARS-Cov-2 infection and should not be used as the sole basis for treatment or other patient management decisions. A negative result may occur with  improper specimen collection/handling, submission of specimen other than nasopharyngeal swab, presence of viral mutation(s) within the areas targeted by this assay, and inadequate number of viral copies (<131 copies/mL). A negative result must be combined with clinical observations, patient history, and epidemiological information. The expected result is Negative.  Fact Sheet for Patients:  PinkCheek.be  Fact Sheet for Healthcare Providers:  GravelBags.it  This test is no t yet approved or cleared by the [place] FDA and  has been authorized for detection and/or diagnosis of SARS-CoV-2 by FDA under an Emergency Use Authorization (EUA). This EUA will remain  in effect (meaning this test can be used) for the duration of the COVID-19 declaration under Section 564(b)(1) of the Act, 21 U.S.C. section 360bbb-3(b)(1), unless the authorization is terminated or revoked sooner.     Influenza A by PCR NEGATIVE NEGATIVE Final   Influenza B by PCR NEGATIVE NEGATIVE Final    Comment: (NOTE) The Xpert Xpress SARS-CoV-2/FLU/RSV assay is intended as an aid in  the diagnosis of influenza from Nasopharyngeal swab specimens and  should not be used as a sole basis for treatment. Nasal washings and  aspirates are unacceptable for Xpert Xpress SARS-CoV-2/FLU/RSV  testing.  Fact Sheet for Patients: PinkCheek.be  Fact Sheet for Healthcare  Providers: GravelBags.it  This test is not yet approved or cleared by the [place] FDA and  has been authorized for detection and/or diagnosis of SARS-CoV-2 by  FDA under an Emergency Use Authorization (EUA). This EUA will remain  in effect (meaning this test can be used) for the duration of the  Covid-19 declaration under Section 564(b)(1) of the Act, 21  U.S.C. section 360bbb-3(b)(1), unless the authorization is  terminated or revoked. Performed at [facility], [place] [address], [place], [place] [address]      Discharge Instructions:   Discharge Instructions    Diet - low sodium heart healthy   Complete by: As directed    Diet Carb Modified   Complete by: As directed    Increase activity slowly   Complete by: As directed    No wound care   Complete by: As directed      Allergies as of 08/08/2020      Reactions   Crestor [rosuvastatin Calcium] Other (See Comments)   Joint aches      Medication List    STOP taking these medications   carvedilol 6.25 MG tablet Commonly known as: COREG   metoprolol tartrate 100 MG tablet Commonly known as: LOPRESSOR   torsemide 20 MG tablet Commonly known as: DEMADEX     TAKE these medications   acetaminophen 650 MG CR tablet Commonly known as: TYLENOL Take 650 mg by mouth every 8 (eight) hours as needed for pain.   aspirin 81 MG EC tablet Take 1 tablet (81 mg total) by mouth daily. Swallow whole.   atorvastatin 80 MG tablet Commonly known as: LIPITOR Take 1  tablet (80 mg total) by mouth daily.   clopidogrel 75 MG tablet Commonly known as: PLAVIX Take 1 tablet (75 mg total) by mouth daily with breakfast.   Farxiga 10 MG Tabs tablet Generic drug: dapagliflozin propanediol Take 10 mg by mouth daily.   furosemide 40 MG tablet Commonly known as: LASIX Take 1 tablet (40 mg total) by mouth 2 (two) times daily. What changed: when to take this   isosorbide mononitrate 30 MG 24 hr  tablet Commonly known as: IMDUR Take 1 tablet (30 mg total) by mouth daily.   losartan 50 MG tablet Commonly known as: COZAAR Take 1 tablet (50 mg total) by mouth daily.   metFORMIN 500 MG tablet Commonly known as: GLUCOPHAGE Take 1 tablet (500 mg total) by mouth daily with breakfast.   pantoprazole 40 MG tablet Commonly known as: PROTONIX Take 1 tablet (40 mg total) by mouth daily.   potassium chloride SA 20 MEQ tablet Commonly known as: KLOR-CON Take 1 tablet (20 mEq total) by mouth daily. Start taking on: August 09, 2020   predniSONE 10 MG tablet Commonly known as: DELTASONE 40 mg x 1 days then 30 mg x 2 days then 20 mg x 2 days then 10 mg x 2 days then stop   Vitamin D (Ergocalciferol) 1.25 MG (50000 UNIT) Caps capsule Commonly known as: DRISDOL Take 1 capsule (50,000 Units total) by mouth every 7 (seven) days for 12 doses.       Follow-up Information    Gean Quint, MD. Go on 08/18/2020.   Specialty: Nephrology Why: Appointment at 8 AM Contact information: [place] [address] [phone]        Samuel Butler, Samuel Halsted, MD Follow up in 1 week(s).   Specialty: Internal Medicine Why: BMP Contact information: [place] [address] [phone]                Time coordinating discharge: 35 min  Signed:  Geradine Girt DO  [facility] 08/08/2020, 12:30 PM

## 2020-08-08 NOTE — Progress Notes (Signed)
DISCHARGE NOTE HOME Samuel Butler to be discharged Home per MD order. Discussed prescriptions and follow up appointments with the patient. Prescriptions given to patient; medication list explained in detail. Patient verbalized understanding.  Skin clean, dry and intact without evidence of skin break down, no evidence of skin tears noted. IV catheter discontinued intact. Site without signs and symptoms of complications. Dressing and pressure applied. Pt denies pain at the site currently. No complaints noted.  Patient free of lines, drains, and wounds.   An After Visit Summary (AVS) was printed and given to the patient. Patient escorted via wheelchair, and discharged home via private auto.  Dolores Hoose, RN

## 2020-08-08 NOTE — Progress Notes (Signed)
Patient ID: Samuel Butler, male   DOB: 29-Oct-1961, 59 y.o.   MRN: [id number] [facility] Progress Note   Assessment/ Plan:   1.  Nephrotic syndrome: With anasarca and significant proteinuria/hypoalbuminemia.  Status post renal biopsy yesterday with preliminary serologies showing positive PR-3 and hepatitis B surface antigen.  Clinical index of suspicion that this is likely membranous nephropathy versus FSGS.  The renal biopsy results will be available in 48 to 72 hours and he has follow-up scheduled with Dr. Candiss Norse at [facility] on 10/8 for ongoing management.  Continue Farxiga. 2.  Anasarca: Secondary to nephrotic syndrome as well as history of diastolic heart failure/liver disease.  Ongoing diuresis with furosemide-now 40 mg p.o. twice daily and he denies having any orthopnea or exertional dyspnea. 3.  Hypertension: On antihypertensive therapy including losartan, blood pressures permissive. 4.  Anemia: Mild, monitor with ongoing diuresis/status post renal biopsy.  Subjective:   Without acute events overnight including hematuria or biopsy site pain.   Objective:   BP 128/90 (BP Location: Left Arm)   Pulse 78   Temp 98.7 F (37.1 C)   Resp 18   Ht 5\' 11"  (1.803 m)   Wt 110.3 kg   SpO2 97%   BMI 33.91 kg/m   Intake/Output Summary (Last 24 hours) at 08/08/2020 1002 Last data filed at 08/08/2020 0600 Gross per 24 hour  Intake 540 ml  Output 1150 ml  Net -610 ml   Weight change:   Physical Exam: Gen: Comfortably resting in bed, watching television CVS: Pulse regular rhythm, normal rate, S1 and S2 normal Resp: Clear to auscultation, no rales/rhonchi Abd: Soft, flat, nontender, bowel sounds normal Ext: Trace lower extremity edema restricted to ankles  Imaging: US BIOPSY (KIDNEY)  Result Date: 08/07/2020 INDICATION: 59 year old male with history of nephrotic syndrome. EXAM: ULTRASOUND GUIDED RENAL BIOPSY COMPARISON:  None. MEDICATIONS: Hydralazine, 10 mg  intravenous ANESTHESIA/SEDATION: Moderate (conscious) sedation was employed during this procedure. A total of Versed 2 mg and Fentanyl 75 mcg was administered intravenously. Moderate Sedation Time: 15 minutes. The patient's level of consciousness and vital signs were monitored continuously by radiology nursing throughout the procedure under my direct supervision. COMPLICATIONS: None immediate. PROCEDURE: Informed written consent was obtained from the patient after a discussion of the risks, benefits and alternatives to treatment. The patient understands and consents the procedure. A timeout was performed prior to the initiation of the procedure. Ultrasound scanning was performed of the bilateral flanks. The inferior pole of the right kidney was selected for biopsy due to location and sonographic window. The procedure was planned. The operative site was prepped and draped in the usual sterile fashion. The overlying soft tissues were anesthetized with 1% lidocaine with epinephrine. A 15 gauge trocar introducer needle was advanced into the inferior cortex of the right kidney. Next, a 15 gauge core needle biopsy device was advanced into the inferior cortex of the right kidney and 2 core biopsies were obtained under direct ultrasound guidance. Real time pathologic review confirmed adequate tissue acquisition. Images were saved for documentation purposes. The introducer needle was removed during injection of Gel-Foam slurry and hemostasis was obtained with manual compression. Post procedural scanning was negative for significant post procedural hemorrhage or additional complication. A dressing was placed. The patient tolerated the procedure well without immediate post procedural complication. IMPRESSION: Technically successful ultrasound guided right renal biopsy. Electronically Signed   By: Ruthann Cancer MD   On: 08/07/2020 10:37    Labs: BMET Recent Labs  Lab  08/03/20 1618 08/04/20 0859 08/05/20 0809  08/06/20 0417 08/07/20 0543 08/08/20 0502  NA 141 139 139 139 141 142  K 3.5 3.7 4.0 3.6 3.5 3.5  CL 103 101 101 104 104 103  CO2 28 24 25 27 28 31   GLUCOSE 128* 179* 266* 207* 152* 140*  BUN 12 11 28* 31* 31* 28*  CREATININE 1.27* 1.15 1.34* 1.30* 1.22 1.21  CALCIUM 7.8* 7.8*  7.6* 7.7* 7.6* 7.5* 7.5*  PHOS  --  5.2* 4.2 3.2 3.1 3.5   CBC Recent Labs  Lab 08/04/20 0859 08/05/20 0809 08/07/20 0543 08/08/20 0908  WBC 6.4 14.8* 7.6 6.7  HGB 12.7* 12.1* 11.3* 12.3*  HCT 38.4* 36.3* 35.2* 38.4*  MCV 91.2 91.7 93.1 93.2  PLT 303 360 352 384    Medications:    . aspirin EC  81 mg Oral Daily  . atorvastatin  80 mg Oral Daily  . clopidogrel  75 mg Oral Daily  . dapagliflozin propanediol  10 mg Oral Daily  . enoxaparin (LOVENOX) injection  40 mg Subcutaneous Q24H  . furosemide  40 mg Oral BID  . insulin aspart  0-5 Units Subcutaneous QHS  . insulin aspart  0-9 Units Subcutaneous TID WC  . isosorbide mononitrate  30 mg Oral Daily  . losartan  50 mg Oral Daily  . pantoprazole  40 mg Oral Daily  . potassium chloride  20 mEq Oral Daily  . predniSONE  40 mg Oral Q breakfast  . Vitamin D (Ergocalciferol)  50,000 Units Oral Q7 days   Elmarie Shiley, MD 08/08/2020, 10:02 AM

## 2020-08-08 NOTE — Plan of Care (Signed)
  Problem: Health Behavior/Discharge Planning: Goal: Ability to manage health-related needs will improve Outcome: Completed/Met   Problem: Clinical Measurements: Goal: Ability to maintain clinical measurements within normal limits will improve Outcome: Completed/Met Goal: Will remain free from infection Outcome: Completed/Met   Problem: Coping: Goal: Level of anxiety will decrease Outcome: Completed/Met   Problem: Safety: Goal: Ability to remain free from injury will improve Outcome: Completed/Met

## 2020-08-14 ENCOUNTER — Encounter: Payer: Self-pay | Admitting: Physician Assistant

## 2020-08-16 ENCOUNTER — Encounter (HOSPITAL_COMMUNITY): Payer: Self-pay

## 2020-08-16 LAB — SURGICAL PATHOLOGY

## 2020-08-18 ENCOUNTER — Encounter: Payer: Self-pay | Admitting: Internal Medicine

## 2020-08-21 ENCOUNTER — Telehealth: Payer: Self-pay

## 2020-08-21 DIAGNOSIS — B181 Chronic viral hepatitis B without delta-agent: Secondary | ICD-10-CM | POA: Insufficient documentation

## 2020-08-21 DIAGNOSIS — E859 Amyloidosis, unspecified: Secondary | ICD-10-CM | POA: Insufficient documentation

## 2020-08-21 NOTE — Telephone Encounter (Signed)
[facility] Patient Teacher, English as a foreign language completed.    The patient is insured through [facility] and has a $50.00 copay.  That is the Copay for Samuel Butler.  We will continue to follow to see if copay assistance is needed.  Ileene Patrick, [facility] Patient [facility] Phone: [phone] Fax:  [phone]

## 2020-08-21 NOTE — Progress Notes (Signed)
[facility]  Reason for Consult: positive hep B surface Ag Referring Provider: Dr Candiss Norse  HPI:  Samuel Butler is a 59 y.o. male who presents to clinic for further evaluation of positive hepatitis B surface Ag.     Patient is a 59 year old male with a past medical history of hypertension, type 2 diabetes, peripheral arterial disease status post right SFA stenting, hyperlipidemia, inflammatory bowel disease (not on treatment), history of substance use, and history of nephrotic syndrome with recent kidney biopsy showing amyloidosis who presents today for further evaluation of positive hepatitis B surface antigen.    Patient recently underwent kidney biopsy for further work-up of his nephrotic syndrome which was notable for amyloidosis.  He is scheduled to see Dr. Lorenso Courier tomorrow at the [facility].  As part of his work-up for his nephrotic syndrome he had a hepatitis B surface antigen reactive.  Surface antibody nonreactive, core antibody reactive.  Hepatitis C antibody nonreactive.  RPR nonreactive.  Recent HIV testing negative.  BMP from September 23 with creatinine 1.08, GFR 74, normal electrolytes.  LFTs not done.  However, LFTs from July 2021 were normal.    He reports dx of HBV several years ago.  Does not recall sepcific treatment for this, but has not been on any treatment for several years.  He endorses hand swelling today.  Seems to have increased some after stopping his prednisone.   Patient's Medications  New Prescriptions   No medications on file  Previous Medications   ACETAMINOPHEN (TYLENOL) 650 MG CR TABLET    Take 650 mg by mouth every 8 (eight) hours as needed for pain.   ASPIRIN EC 81 MG EC TABLET    Take 1 tablet (81 mg total) by mouth daily. Swallow whole.   ATORVASTATIN (LIPITOR) 80 MG TABLET    Take 1 tablet (80 mg total) by mouth daily.   CLOPIDOGREL (PLAVIX) 75 MG TABLET    Take 1 tablet (75 mg total) by mouth daily  with breakfast.   ERGOCALCIFEROL (VITAMIN D2) 1.25 MG (50000 UT) CAPSULE    Take 50,000 Units by mouth once a week.   FARXIGA 10 MG TABS TABLET    Take 10 mg by mouth daily.   FUROSEMIDE (LASIX) 40 MG TABLET    Take 1 tablet (40 mg total) by mouth 2 (two) times daily.   ISOSORBIDE MONONITRATE (IMDUR) 30 MG 24 HR TABLET    Take 1 tablet (30 mg total) by mouth daily.   LOSARTAN (COZAAR) 50 MG TABLET    Take 1 tablet (50 mg total) by mouth daily.   METFORMIN (GLUCOPHAGE) 500 MG TABLET    Take 1 tablet (500 mg total) by mouth daily with breakfast.   PANTOPRAZOLE (PROTONIX) 40 MG TABLET    Take 1 tablet (40 mg total) by mouth daily.   POTASSIUM CHLORIDE SA (KLOR-CON) 20 MEQ TABLET    Take 1 tablet (20 mEq total) by mouth daily.   PREDNISONE (DELTASONE) 10 MG TABLET    40 mg x 1 days then 30 mg x 2 days then 20 mg x 2 days then 10 mg x 2 days then stop   TORSEMIDE (DEMADEX) 20 MG TABLET    Take 20 mg by mouth 2 (two) times daily.  Modified Medications   No medications on file  Discontinued Medications   No medications on file      Past Medical History:  Diagnosis Date  DDD (degenerative disc disease), lumbar    per his report   Diabetes mellitus without complication ([place])    Gout 04/16/2013   Hyperlipidemia    Hypertension    IBD (inflammatory bowel disease) 04/05/2016   Tobacco use disorder 03/28/2015    Social History   Tobacco Use   Smoking status: Former Smoker    Types: Cigarettes    Quit date: 10/23/2016    Years since quitting: 3.8   Smokeless tobacco: Never Used   Tobacco comment: per patient 5 cigarettes a day   Vaping Use   Vaping Use: Never used  Substance Use Topics   Alcohol use: Yes    Comment: occasional   Drug use: No    Family History  Problem Relation Age of Onset   Hypertension Father    Diabetes Father    Stroke Father     Allergies  Allergen Reactions   Crestor [Rosuvastatin Calcium] Other (See Comments)    Joint aches    Review  of Systems: Review of Systems  Constitutional: Negative for chills and fever.  Respiratory: Negative.   Cardiovascular: Negative.   Gastrointestinal:       + abdominal swelling.  Musculoskeletal:       + hand swelling.  Skin: Negative.   Neurological: Negative.     Objective: Vitals:   08/22/20 0917  BP: (!) 137/96  Pulse: 96  Temp: 99 F (37.2 C)  TempSrc: Oral  Weight: 245 lb (111.1 kg)     Body mass index is 34.17 kg/m.  Physical Exam Vitals reviewed.  Constitutional:      General: He is not in acute distress.    Appearance: Normal appearance.     Comments: Very pleasant and friendly gentleman accompanied by his wife.   HENT:     Head: Normocephalic and atraumatic.  Pulmonary:     Effort: Pulmonary effort is normal. No respiratory distress.  Musculoskeletal:     Comments: Right hand with increased edema than left.  Neurological:     General: No focal deficit present.     Mental Status: He is alert and oriented to person, place, and time.      Pertinent Labs and Microbiology:   CBC Latest Ref Rng & Units 08/08/2020 08/07/2020 08/05/2020  WBC 4.0 - 10.5 K/uL 6.7 7.6 14.8(H)  Hemoglobin 13.0 - 17.0 g/dL 12.3(L) 11.3(L) 12.1(L)  Hematocrit 39 - 52 % 38.4(L) 35.2(L) 36.3(L)  Platelets 150 - 400 K/uL 384 352 360   CMP Latest Ref Rng & Units 08/08/2020 08/07/2020 08/06/2020  Glucose 70 - 99 mg/dL 140(H) 152(H) 207(H)  BUN 6 - 20 mg/dL 28(H) 31(H) 31(H)  Creatinine 0.61 - 1.24 mg/dL 1.21 1.22 1.30(H)  Sodium 135 - 145 mmol/L 142 141 139  Potassium 3.5 - 5.1 mmol/L 3.5 3.5 3.6  Chloride 98 - 111 mmol/L 103 104 104  CO2 22 - 32 mmol/L 31 28 27   Calcium 8.9 - 10.3 mg/dL 7.5(L) 7.5(L) 7.6(L)  Total Protein 6.1 - 8.1 g/dL - - -  Total Bilirubin 0.2 - 1.2 mg/dL - - -  Alkaline Phos 39 - 117 U/L - - -  AST 10 - 35 U/L - - -  ALT 9 - 46 U/L - - -     Assessment and Plan: Amyloidosis ([place]) Has appt tmrw at the [facility] with Dr Lorenso Courier to discuss treatment  options.  His treatment options will help with whether antiviral medication is needed regardless of HBV DNA level to prevent reactivation.  Chronic viral hepatitis B without delta-agent (HCC) Patients labs c/w chronic hepatitis B infection that was likely acquired several years ago per patient report.    Plan: -- will check HBV DNA PCR  -- Fibrosure and liver US with elastography to screen for [facility] and assess level of fibrosis -- follow up after sees oncology regarding treatment plan for his AL Amyloid -- RTC 3 months    Orders Placed This Encounter  Procedures   US ABDOMEN COMPLETE W/ELASTOGRAPHY    Standing Status:   Future    Standing Expiration Date:   02/19/2021    Order Specific Question:   Reason for Exam (SYMPTOM  OR DIAGNOSIS REQUIRED)    Answer:   chronic hep B.  Screen for [facility] and assess for fibrosis.    Order Specific Question:   Preferred imaging location?    Answer:   [facility]   Hepatitis B DNA, ultraquantitative, PCR   COMPLETE METABOLIC PANEL WITH GFR   Liver Fibrosis, FibroTest-ActiTest        [facility] 08/22/2020, 10:05 AM

## 2020-08-22 ENCOUNTER — Ambulatory Visit: Payer: BC Managed Care – PPO | Admitting: Internal Medicine

## 2020-08-22 ENCOUNTER — Other Ambulatory Visit: Payer: Self-pay

## 2020-08-22 ENCOUNTER — Encounter: Payer: Self-pay | Admitting: Internal Medicine

## 2020-08-22 VITALS — BP 137/96 | HR 96 | Temp 99.0°F | Wt 245.0 lb

## 2020-08-22 DIAGNOSIS — E8581 Light chain (AL) amyloidosis: Secondary | ICD-10-CM

## 2020-08-22 DIAGNOSIS — B181 Chronic viral hepatitis B without delta-agent: Secondary | ICD-10-CM

## 2020-08-22 NOTE — Assessment & Plan Note (Addendum)
Patients labs c/w chronic hepatitis B infection that was likely acquired several years ago per patient report.    Plan: -- will check HBV DNA PCR  -- Fibrosure and liver US with elastography to screen for [facility] and assess level of fibrosis -- follow up after sees oncology regarding treatment plan for his AL Amyloid -- RTC 3 months

## 2020-08-22 NOTE — Patient Instructions (Signed)
Thank you for coming to see me today. It was a pleasure.   I want to check some labs today and get an Ultrasound of your liver like we talked about to see if there is any long term scarring to your liver from hepatitis B infection.  Once we have this result and you have seen Dr Lorenso Courier regarding the amyloidosis then we will have a better idea if this needs to be treated with medicine or monitored closely.  Please follow-up with me in 3 month.s   If you have any questions or concerns, please do not hesitate to call the office at [phone].  Take Care,   Jule Ser, DO

## 2020-08-22 NOTE — Assessment & Plan Note (Signed)
Has appt tmrw at the [facility] with Dr Lorenso Courier to discuss treatment options.  His treatment options will help with whether antiviral medication is needed regardless of HBV DNA level to prevent reactivation.

## 2020-08-23 ENCOUNTER — Inpatient Hospital Stay: Payer: BC Managed Care – PPO

## 2020-08-23 ENCOUNTER — Encounter: Payer: Self-pay | Admitting: Hematology and Oncology

## 2020-08-23 ENCOUNTER — Other Ambulatory Visit: Payer: Self-pay

## 2020-08-23 ENCOUNTER — Inpatient Hospital Stay: Payer: BC Managed Care – PPO | Attending: Hematology and Oncology | Admitting: Hematology and Oncology

## 2020-08-23 VITALS — BP 132/94 | HR 95 | Temp 96.9°F | Resp 17 | Ht 71.0 in | Wt 233.5 lb

## 2020-08-23 DIAGNOSIS — B1910 Unspecified viral hepatitis B without hepatic coma: Secondary | ICD-10-CM | POA: Insufficient documentation

## 2020-08-23 DIAGNOSIS — M109 Gout, unspecified: Secondary | ICD-10-CM | POA: Insufficient documentation

## 2020-08-23 DIAGNOSIS — E8581 Light chain (AL) amyloidosis: Secondary | ICD-10-CM

## 2020-08-23 DIAGNOSIS — I10 Essential (primary) hypertension: Secondary | ICD-10-CM | POA: Diagnosis not present

## 2020-08-23 DIAGNOSIS — E119 Type 2 diabetes mellitus without complications: Secondary | ICD-10-CM | POA: Diagnosis not present

## 2020-08-23 DIAGNOSIS — Z79899 Other long term (current) drug therapy: Secondary | ICD-10-CM | POA: Diagnosis not present

## 2020-08-23 DIAGNOSIS — Z8249 Family history of ischemic heart disease and other diseases of the circulatory system: Secondary | ICD-10-CM | POA: Insufficient documentation

## 2020-08-23 DIAGNOSIS — E785 Hyperlipidemia, unspecified: Secondary | ICD-10-CM

## 2020-08-23 DIAGNOSIS — R0602 Shortness of breath: Secondary | ICD-10-CM | POA: Insufficient documentation

## 2020-08-23 DIAGNOSIS — B181 Chronic viral hepatitis B without delta-agent: Secondary | ICD-10-CM

## 2020-08-23 DIAGNOSIS — M7989 Other specified soft tissue disorders: Secondary | ICD-10-CM | POA: Insufficient documentation

## 2020-08-23 DIAGNOSIS — Z87891 Personal history of nicotine dependence: Secondary | ICD-10-CM | POA: Insufficient documentation

## 2020-08-23 DIAGNOSIS — N049 Nephrotic syndrome with unspecified morphologic changes: Secondary | ICD-10-CM | POA: Insufficient documentation

## 2020-08-23 LAB — CBC WITH DIFFERENTIAL (CANCER CENTER ONLY)
Abs Immature Granulocytes: 0.02 10*3/uL (ref 0.00–0.07)
Basophils Absolute: 0 10*3/uL (ref 0.0–0.1)
Basophils Relative: 1 %
Eosinophils Absolute: 0.3 10*3/uL (ref 0.0–0.5)
Eosinophils Relative: 4 %
HCT: 40.4 % (ref 39.0–52.0)
Hemoglobin: 13.1 g/dL (ref 13.0–17.0)
Immature Granulocytes: 0 %
Lymphocytes Relative: 30 %
Lymphs Abs: 1.7 10*3/uL (ref 0.7–4.0)
MCH: 30.3 pg (ref 26.0–34.0)
MCHC: 32.4 g/dL (ref 30.0–36.0)
MCV: 93.5 fL (ref 80.0–100.0)
Monocytes Absolute: 0.4 10*3/uL (ref 0.1–1.0)
Monocytes Relative: 7 %
Neutro Abs: 3.3 10*3/uL (ref 1.7–7.7)
Neutrophils Relative %: 58 %
Platelet Count: 164 10*3/uL (ref 150–400)
RBC: 4.32 MIL/uL (ref 4.22–5.81)
RDW: 15.9 % — ABNORMAL HIGH (ref 11.5–15.5)
WBC Count: 5.7 10*3/uL (ref 4.0–10.5)
nRBC: 0 % (ref 0.0–0.2)

## 2020-08-23 LAB — LACTATE DEHYDROGENASE: LDH: 417 U/L — ABNORMAL HIGH (ref 98–192)

## 2020-08-23 NOTE — Progress Notes (Signed)
[place] [phone]) [phone]   Fax:(336) [place] NOTE  Patient Care Team: Isaac Bliss, Rayford Halsted, MD as PCP - General (Internal Medicine)  Hematological/Oncological History # Amyloidosis of the Kidney 1) 08/03/2020: Free kappa 19.5, Lambda 205.7, Kappa/Lambda ratio: 0.09. M protein undetectable 2) 08/07/2020: kidney biopsy performed, results consistent with lambda light chain AL amyloidosis 3) 08/23/2020: establish care with Dr. Lorenso Courier   CHIEF COMPLAINTS/PURPOSE OF CONSULTATION:  "Amyloidosis "  HISTORY OF PRESENTING ILLNESS:  Samuel Butler 59 y.o. male with medical history significant for DM type II, gout, HLD, and HTN who presents for evaluation of newly diagnosed amyloidosis of the kidney.   On review of the previous records Mr. Drees was referred by Dr. Gean Quint at [facility] due concern for renal amyloidosis. The patient was initially referred due to proteinuria.  As part of the patient's evaluation he had multiple myeloma labs drawn which showed a kappa 19.5, lambda 205.7, and a kappa lambda ratio of 0.09.  M protein was undetectable in the serum.  The patient subsequently had a kidney biopsy performed on 08/07/2020 with results consistent with light chain AL amyloidosis.  Due to this finding the patient was referred to hematology for further evaluation and management.  Interestingly as part of this evaluation the patient was found to have a positive hepatitis B surface antigen, positive core antibody, but negative surface antibody.  He saw infectious disease yesterday and at that time they ordered a hepatitis B ultra quant and awaited the results of our consult today prior to recommending therapy.   On exam today Mr. Penkala is accompanied by his wife.  He has a pretty good understanding of why he is here today and has a grasp of amyloidosis and protein deposition disease.  He reports that he has a new diagnosis of hepatitis B which is  currently being evaluated by infectious disease.  They are currently awaiting our evaluation in order to determine the next steps regarding his treatment.  The patient notes that his symptoms began with bubbling and foamy urine as well as considerable weight loss.  He reports that he is lost about 10 pounds since 07/25/2020.  He also notes that he has been having considerable swelling throughout his entire body most predominantly in his hands and his lower extremities.  He does not notice any fluid collecting in his abdomen.  He also has been having difficulty with pain at the surgical site where he recently had stents placed in his right leg.  He also endorses having shortness of breath with no cough.  On further discussion he notes that he has a family history remarkable for heart disease in his mother and cancer in his older sister, though he is unsure what type of cancer this was.  He also notes that his sister had problems with her pancreas and is now deceased.  He is a former smoker who quit in 2015 after smoking less than 2 packs/week for 35 to 40 years.  He is also now abstaining from alcohol after quitting in 2015 as well.  He is currently employed as a Freight forwarder, but has not been able to work as a result of his increased swelling.  Currently he denies having any issues with fevers, chills, sweats, nausea, vomiting, or diarrhea.  He notes that his energy level is fair.  A full 10 point ROS is listed below.  MEDICAL HISTORY:  Past Medical History:  Diagnosis Date  . DDD (degenerative  disc disease), lumbar    per his report  . Diabetes mellitus without complication ([place])   . Gout 04/16/2013  . Hyperlipidemia   . Hypertension   . IBD (inflammatory bowel disease) 04/05/2016  . Tobacco use disorder 03/28/2015    SURGICAL HISTORY: Past Surgical History:  Procedure Laterality Date  . ABDOMINAL AORTOGRAM W/LOWER EXTREMITY Right 06/26/2020   Procedure: ABDOMINAL AORTOGRAM W/LOWER EXTREMITY;   Surgeon: Lorretta Harp, MD;  Location: [facility];  Service: Cardiovascular;  Laterality: Right;  . COLONOSCOPY    . PERIPHERAL VASCULAR INTERVENTION  06/26/2020   Procedure: PERIPHERAL VASCULAR INTERVENTION;  Surgeon: Lorretta Harp, MD;  Location: [facility];  Service: Cardiovascular;;  Right SFA  . TONSILLECTOMY      SOCIAL HISTORY: Social History   Socioeconomic History  . Marital status: Married    Spouse name: Not on file  . Number of children: Not on file  . Years of education: Not on file  . Highest education level: Not on file  Occupational History  . Not on file  Tobacco Use  . Smoking status: Former Smoker    Types: Cigarettes    Quit date: 10/23/2016    Years since quitting: 3.8  . Smokeless tobacco: Never Used  . Tobacco comment: per patient 5 cigarettes a day   Vaping Use  . Vaping Use: Never used  Substance and Sexual Activity  . Alcohol use: Yes    Comment: occasional  . Drug use: No  . Sexual activity: Not on file  Other Topics Concern  . 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 Difficulty of Paying Living Expenses: Not on file  Food Insecurity:   . Worried About Charity fundraiser in the Last Year: Not on file  . Ran Out of Food in the Last Year: Not on file  Transportation Needs:   . Lack of Transportation (Medical): Not on file  . Lack of Transportation (Non-Medical): Not on file  Physical Activity:   . Days of Exercise per Week: Not on file  . Minutes of Exercise per Session: Not on file  Stress:   . Feeling of Stress : Not on file  Social Connections:   . Frequency of Communication with Friends and Family: Not on file  . Frequency of Social Gatherings with Friends and  Family: Not on file  . Attends Religious Services: Not on file  . Active Member of Clubs or Organizations: Not on file  . Attends Archivist Meetings: Not on file  . Marital Status: Not on file  Intimate Partner Violence:   . Fear of Current or Ex-Partner: Not on file  . Emotionally Abused: Not on file  . Physically Abused: Not on file  . Sexually Abused: Not on file    FAMILY HISTORY: Family History  Problem Relation Age of Onset  . Hypertension Father   . Diabetes Father   . Stroke Father     ALLERGIES:  is allergic to crestor [rosuvastatin calcium].  MEDICATIONS:  Current Outpatient Medications  Medication Sig Dispense Refill  . atorvastatin (LIPITOR) 80 MG tablet Take 80 mg  by mouth daily.    . clopidogrel (PLAVIX) 75 MG tablet Take 1 tablet (75 mg total) by mouth daily with breakfast. 90 tablet 3  . ergocalciferol (VITAMIN D2) 1.25 MG (50000 UT) capsule Take 50,000 Units by mouth once a week.    . isosorbide mononitrate (IMDUR) 30 MG 24 hr tablet Take 1 tablet (30 mg total) by mouth daily. 90 tablet 1  . losartan (COZAAR) 50 MG tablet Take 1 tablet (50 mg total) by mouth daily. 90 tablet 3  . metFORMIN (GLUCOPHAGE) 500 MG tablet Take 1 tablet (500 mg total) by mouth daily with breakfast. 90 tablet 1  . pantoprazole (PROTONIX) 40 MG tablet Take 1 tablet (40 mg total) by mouth daily. 90 tablet 1  . potassium chloride SA (KLOR-CON) 20 MEQ tablet Take 1 tablet (20 mEq total) by mouth daily. 30 tablet 0  . acetaminophen (TYLENOL) 650 MG CR tablet Take 650 mg by mouth every 8 (eight) hours as needed for pain.    Marland Kitchen aspirin EC 81 MG EC tablet Take 1 tablet (81 mg total) by mouth daily. Swallow whole. 30 tablet 11  . FARXIGA 10 MG TABS tablet Take 10 mg by mouth daily. (Patient not taking: Reported on 08/23/2020)    . torsemide (DEMADEX) 20 MG tablet Take 20 mg by mouth 2 (two) times daily.     No current facility-administered medications for this visit.    REVIEW OF  SYSTEMS:   Constitutional: ( - ) fevers, ( - )  chills , ( - ) night sweats Eyes: ( - ) blurriness of vision, ( - ) double vision, ( - ) watery eyes Ears, nose, mouth, throat, and face: ( - ) mucositis, ( - ) sore throat Respiratory: ( - ) cough, ( - ) dyspnea, ( - ) wheezes Cardiovascular: ( - ) palpitation, ( - ) chest discomfort, ( - ) lower extremity swelling Gastrointestinal:  ( - ) nausea, ( - ) heartburn, ( - ) change in bowel habits Skin: ( - ) abnormal skin rashes Lymphatics: ( - ) new lymphadenopathy, ( - ) easy bruising Neurological: ( - ) numbness, ( - ) tingling, ( - ) new weaknesses Behavioral/Psych: ( - ) mood change, ( - ) new changes  All other systems were reviewed with the patient and are negative.  PHYSICAL EXAMINATION: ECOG PERFORMANCE STATUS: 1 - Symptomatic but completely ambulatory  Vitals:   08/23/20 0914  BP: (!) 132/94  Pulse: 95  Resp: 17  Temp: (!) 96.9 F (36.1 C)  SpO2: 97%   Filed Weights   08/23/20 0914  Weight: 233 lb 8 oz (105.9 kg)    GENERAL: well appearing middle aged [place] American male in NAD  SKIN: skin color, texture, turgor are normal, no rashes or significant lesions EYES: conjunctiva are pink and non-injected, sclera clear LUNGS: clear to auscultation and percussion with normal breathing effort HEART: regular rate & rhythm and no murmurs and +2 bilateral lower extremity edema. Edema in hands bilaterally as well.  ABDOMEN: soft, non-tender, non-distended, normal bowel sounds Musculoskeletal: no cyanosis of digits and no clubbing  PSYCH: alert & oriented x 3, fluent speech NEURO: no focal motor/sensory deficits  LABORATORY DATA:  I have reviewed the data as listed CBC Latest Ref Rng & Units 08/23/2020 08/08/2020 08/07/2020  WBC 4.0 - 10.5 K/uL 5.7 6.7 7.6  Hemoglobin 13.0 - 17.0 g/dL 13.1 12.3(L) 11.3(L)  Hematocrit 39 - 52 % 40.4 38.4(L) 35.2(L)  Platelets 150 - 400 K/uL  164 384 352    CMP Latest Ref Rng & Units 08/22/2020  08/08/2020 08/07/2020  Glucose 65 - 99 mg/dL 115(H) 140(H) 152(H)  BUN 7 - 25 mg/dL 13 28(H) 31(H)  Creatinine 0.70 - 1.25 mg/dL 1.03 1.21 1.22  Sodium 135 - 146 mmol/L 145 142 141  Potassium 3.5 - 5.3 mmol/L 3.8 3.5 3.5  Chloride 98 - 110 mmol/L 106 103 104  CO2 20 - 32 mmol/L 32 31 28  Calcium 8.6 - 10.3 mg/dL 7.6(L) 7.5(L) 7.5(L)  Total Protein 6.1 - 8.1 g/dL 4.1(L) - -  Total Bilirubin 0.2 - 1.2 mg/dL 0.5 - -  Alkaline Phos 39 - 117 U/L - - -  AST 10 - 35 U/L 23 - -  ALT 9 - 46 U/L 32 - -     PATHOLOGY:    RADIOGRAPHIC STUDIES: DG Chest 2 View  Result Date: 08/03/2020 CLINICAL DATA:  Shortness of breath, leg swelling EXAM: CHEST - 2 VIEW COMPARISON:  Chest x-ray 12/18/2001 report without images. FINDINGS: The heart size and mediastinal contours are within normal limits. Bibasilar linear atelectasis. No focal consolidation. No pulmonary edema. No pleural effusion. No pneumothorax. No acute osseous abnormality. IMPRESSION: No active cardiopulmonary disease. Electronically Signed   By: Iven Finn M.D.   On: 08/03/2020 18:06   ECHOCARDIOGRAM COMPLETE  Result Date: 08/05/2020    ECHOCARDIOGRAM REPORT   Patient Name:   Samuel Butler Date of Exam: 08/05/2020 Medical Rec #:  [id number]       Height:       71.0 in Accession #:    [id number]      Weight:       245.4 lb Date of Birth:  [birth year]       BSA:          2.300 m Patient Age:    59 years        BP:           115/87 mmHg Patient Gender: M               HR:           94 bpm. Exam Location:  Inpatient Procedure: 2D Echo, Cardiac Doppler and Color Doppler Indications:    Dyspnea  History:        Patient has prior history of Echocardiogram examinations, most                 recent 03/03/2020. PAD, Signs/Symptoms:Shortness of Breath and LE                 edema; Risk Factors:Hypertension, Dyslipidemia, Diabetes and                 Current Smoker.  Sonographer:    Dustin Flock Referring Phys: [place]  1. Left  ventricular ejection fraction, by estimation, is 65 to 70%. The left ventricle has normal function. The left ventricle has no regional wall motion abnormalities. There is severe concentric left ventricular hypertrophy. Left ventricular diastolic  parameters are consistent with Grade I diastolic dysfunction (impaired relaxation). Elevated left ventricular end-diastolic pressure.  2. Right ventricular systolic function is normal. The right ventricular size is normal. There is moderately elevated pulmonary artery systolic pressure.  3. Left atrial size was mildly dilated.  4. The mitral valve is grossly normal. Trivial mitral valve regurgitation.  5. The aortic valve is tricuspid. Aortic valve regurgitation is mild. Aortic regurgitation PHT measures 440 msec.  6. Aortic dilatation noted.  There is mild dilatation of the ascending aorta, measuring 40 mm.  7. The inferior vena cava is dilated in size with <50% respiratory variability, suggesting right atrial pressure of 15 mmHg. Comparison(s): Changes from prior study are noted. 03/03/20: LVEF 65-70%, grade 1 DD, elevated LA pressue, RVSP 33 mmHg. FINDINGS  Left Ventricle: Left ventricular ejection fraction, by estimation, is 65 to 70%. The left ventricle has normal function. The left ventricle has no regional wall motion abnormalities. The left ventricular internal cavity size was normal in size. There is  severe concentric left ventricular hypertrophy. Left ventricular diastolic parameters are consistent with Grade I diastolic dysfunction (impaired relaxation). Elevated left ventricular end-diastolic pressure. Right Ventricle: The right ventricular size is normal. No increase in right ventricular wall thickness. Right ventricular systolic function is normal. There is moderately elevated pulmonary artery systolic pressure. The tricuspid regurgitant velocity is 2.76 m/s, and with an assumed right atrial pressure of 15 mmHg, the estimated right ventricular systolic pressure  is 41.3 mmHg. Left Atrium: Left atrial size was mildly dilated. Right Atrium: Right atrial size was normal in size. Pericardium: There is no evidence of pericardial effusion. Mitral Valve: The mitral valve is grossly normal. Trivial mitral valve regurgitation. Tricuspid Valve: The tricuspid valve is grossly normal. Tricuspid valve regurgitation is mild. Aortic Valve: The aortic valve is tricuspid. Aortic valve regurgitation is mild. Aortic regurgitation PHT measures 440 msec. Pulmonic Valve: The pulmonic valve was normal in structure. Pulmonic valve regurgitation is not visualized. Aorta: Aortic dilatation noted. There is mild dilatation of the ascending aorta, measuring 40 mm. Venous: The inferior vena cava is dilated in size with less than 50% respiratory variability, suggesting right atrial pressure of 15 mmHg. IAS/Shunts: No atrial level shunt detected by color flow Doppler.  LEFT VENTRICLE PLAX 2D LVIDd:         3.50 cm  Diastology LVIDs:         2.70 cm  LV e' medial:    4.35 cm/s LV PW:         1.70 cm  LV E/e' medial:  21.5 LV IVS:        1.70 cm  LV e' lateral:   5.22 cm/s LVOT diam:     2.50 cm  LV E/e' lateral: 17.9 LV SV:         74 LV SV Index:   32 LVOT Area:     4.91 cm  RIGHT VENTRICLE RV Basal diam:  3.20 cm RV S prime:     17.40 cm/s TAPSE (M-mode): 2.7 cm LEFT ATRIUM             Index       RIGHT ATRIUM           Index LA diam:        3.20 cm 1.39 cm/m  RA Area:     21.60 cm LA Vol (A2C):   49.4 ml 21.48 ml/m RA Volume:   71.80 ml  31.22 ml/m LA Vol (A4C):   28.5 ml 12.39 ml/m LA Biplane Vol: 41.3 ml 17.96 ml/m  AORTIC VALVE LVOT Vmax:   102.00 cm/s LVOT Vmean:  59.100 cm/s LVOT VTI:    0.151 m AI PHT:      440 msec  AORTA Ao Root diam: 3.90 cm MITRAL VALVE               TRICUSPID VALVE MV Area (PHT): 5.38 cm    TR Peak grad:   30.5 mmHg MV Decel Time:  141 msec    TR Vmax:        276.00 cm/s MV E velocity: 93.40 cm/s MV A velocity: 82.70 cm/s  SHUNTS MV E/A ratio:  1.13        Systemic  VTI:  0.15 m                            Systemic Diam: 2.50 cm Lyman Bishop MD Electronically signed by Lyman Bishop MD Signature Date/Time: 08/05/2020/12:16:16 PM    Final    US BIOPSY (KIDNEY)  Result Date: 08/07/2020 INDICATION: 59 year old male with history of nephrotic syndrome. EXAM: ULTRASOUND GUIDED RENAL BIOPSY COMPARISON:  None. MEDICATIONS: Hydralazine, 10 mg intravenous ANESTHESIA/SEDATION: Moderate (conscious) sedation was employed during this procedure. A total of Versed 2 mg and Fentanyl 75 mcg was administered intravenously. Moderate Sedation Time: 15 minutes. The patient's level of consciousness and vital signs were monitored continuously by radiology nursing throughout the procedure under my direct supervision. COMPLICATIONS: None immediate. PROCEDURE: Informed written consent was obtained from the patient after a discussion of the risks, benefits and alternatives to treatment. The patient understands and consents the procedure. A timeout was performed prior to the initiation of the procedure. Ultrasound scanning was performed of the bilateral flanks. The inferior pole of the right kidney was selected for biopsy due to location and sonographic window. The procedure was planned. The operative site was prepped and draped in the usual sterile fashion. The overlying soft tissues were anesthetized with 1% lidocaine with epinephrine. A 15 gauge trocar introducer needle was advanced into the inferior cortex of the right kidney. Next, a 33 gauge core needle biopsy device was advanced into the inferior cortex of the right kidney and 2 core biopsies were obtained under direct ultrasound guidance. Real time pathologic review confirmed adequate tissue acquisition. Images were saved for documentation purposes. The introducer needle was removed during injection of Gel-Foam slurry and hemostasis was obtained with manual compression. Post procedural scanning was negative for significant post procedural  hemorrhage or additional complication. A dressing was placed. The patient tolerated the procedure well without immediate post procedural complication. IMPRESSION: Technically successful ultrasound guided right renal biopsy. Electronically Signed   By: Ruthann Cancer MD   On: 08/07/2020 10:37   VAS [place] LOWER EXTREMITY VENOUS (DVT) (ONLY MC & WL 7a-7p)  Result Date: 08/04/2020  Lower Venous DVTStudy Indications: Bilateral lower extremity pitting edema.  Comparison Study: No prior study Performing Technologist: Maudry Mayhew MHA, RDMS, RVT, RDCS  Examination Guidelines: A complete evaluation includes B-mode imaging, spectral Doppler, color Doppler, and power Doppler as needed of all accessible portions of each vessel. Bilateral testing is considered an integral part of a complete examination. Limited examinations for reoccurring indications may be performed as noted. The reflux portion of the exam is performed with the patient in reverse Trendelenburg.  +---------+---------------+---------+-----------+----------+--------------+ RIGHT    CompressibilityPhasicitySpontaneityPropertiesThrombus Aging +---------+---------------+---------+-----------+----------+--------------+ CFV      Full           Yes      Yes                                 +---------+---------------+---------+-----------+----------+--------------+ SFJ      Full           Yes      Yes                                 +---------+---------------+---------+-----------+----------+--------------+  FV Prox  Full           Yes      Yes                                 +---------+---------------+---------+-----------+----------+--------------+ FV Mid   Full           Yes      Yes                                 +---------+---------------+---------+-----------+----------+--------------+ FV DistalFull                                                         +---------+---------------+---------+-----------+----------+--------------+ PFV      Full                                                        +---------+---------------+---------+-----------+----------+--------------+ POP      Full                                                        +---------+---------------+---------+-----------+----------+--------------+ PTV      Full                                                        +---------+---------------+---------+-----------+----------+--------------+ PERO     Full                                                        +---------+---------------+---------+-----------+----------+--------------+   +---------+---------------+---------+-----------+----------+--------------+ LEFT     CompressibilityPhasicitySpontaneityPropertiesThrombus Aging +---------+---------------+---------+-----------+----------+--------------+ CFV      Full           Yes      Yes                                 +---------+---------------+---------+-----------+----------+--------------+ SFJ      Full                                                        +---------+---------------+---------+-----------+----------+--------------+ FV Prox  Full                                                        +---------+---------------+---------+-----------+----------+--------------+  FV Mid   Full                                                        +---------+---------------+---------+-----------+----------+--------------+ FV DistalFull                                                        +---------+---------------+---------+-----------+----------+--------------+ PFV      Full                                                        +---------+---------------+---------+-----------+----------+--------------+ POP      Full           Yes      Yes                                  +---------+---------------+---------+-----------+----------+--------------+ PTV      Full                                                        +---------+---------------+---------+-----------+----------+--------------+ PERO     Full                                                        +---------+---------------+---------+-----------+----------+--------------+     Summary: RIGHT: - There is no evidence of deep vein thrombosis in the lower extremity.  - No cystic structure found in the popliteal fossa.  LEFT: - There is no evidence of deep vein thrombosis in the lower extremity.  - No cystic structure found in the popliteal fossa.  *See table(s) above for measurements and observations. Electronically signed by Deitra Mayo MD on 08/04/2020 at 10:14:37 AM.    Final    UE VENOUS DUPLEX (Power & WL 7 am - 7 pm)  Result Date: 08/04/2020 UPPER VENOUS STUDY  Indications: Pain, and edema x3 days. Limitations: Patient position; restricted mobility. Comparison Study: No prior study Performing Technologist: Maudry Mayhew MHA, RDMS, RVT, RDCS  Examination Guidelines: A complete evaluation includes B-mode imaging, spectral Doppler, color Doppler, and power Doppler as needed of all accessible portions of each vessel. Bilateral testing is considered an integral part of a complete examination. Limited examinations for reoccurring indications may be performed as noted.  Right Findings: +----------+------------+---------+-----------+----------+-------+ RIGHT     CompressiblePhasicitySpontaneousPropertiesSummary +----------+------------+---------+-----------+----------+-------+ IJV           Full       Yes       Yes                      +----------+------------+---------+-----------+----------+-------+ Subclavian    Full  Yes       Yes                      +----------+------------+---------+-----------+----------+-------+ Axillary      Full       Yes       Yes                       +----------+------------+---------+-----------+----------+-------+ Brachial      Full       Yes       Yes                      +----------+------------+---------+-----------+----------+-------+ Radial        Full                                          +----------+------------+---------+-----------+----------+-------+ Ulnar         Full                                          +----------+------------+---------+-----------+----------+-------+ Cephalic      Full                                          +----------+------------+---------+-----------+----------+-------+ Basilic       Full                                          +----------+------------+---------+-----------+----------+-------+  Left Findings: +----------+------------+---------+-----------+----------+--------------+ LEFT      CompressiblePhasicitySpontaneousProperties   Summary     +----------+------------+---------+-----------+----------+--------------+ Subclavian                                          Not visualized +----------+------------+---------+-----------+----------+--------------+  Summary:  Right: No evidence of deep vein thrombosis in the upper extremity. No evidence of superficial vein thrombosis in the upper extremity.  *See table(s) above for measurements and observations.  Diagnosing physician: Deitra Mayo MD Electronically signed by Deitra Mayo MD on 08/04/2020 at 10:14:49 AM.    Final     ASSESSMENT & PLAN Samuel Butler 59 y.o. male with medical history significant for DM type II, gout, HLD, and HTN who presents for evaluation of newly diagnosed amyloidosis of the kidney.  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  The etiology of this amyloidosis is not entirely clear at this point based on the initial work-up.  The kidney biopsy does clearly  show amyloid deposits, though confirming AL amyloidosis would require numerous other steps including mass spectrometry as well as a monoclonal protein in the plasma cell population.  As such we will need to proceed quickly with a bone marrow biopsy and 24-hour urine protein in order to help confirm the diagnosis of AL amyloidosis.  Alternative etiologies could include AA amyloidosis resulting from the patient's hepatitis B or ATTR amyloidosis (less likely with renal involvement).  Once  we have detected a monoclonal component we can proceed with treatment while the rest of the evaluation is underway.    I would recommend proceeding with hepatitis B treatment as needed while her work-up is underway, however we will need to have discussions with infectious disease about how these treatments interact with potential chemotherapy regimens.  If the patient were found to have AL amyloidosis the treatment of choice would be Daratumumab/Bortezomib/Cyclophosphamide/Dexamethasone (per the [place] 2021; [id number])  # Amyloidosis of the Kidney --the leading differential diagnosis for this patient is AL amyloidosis (particularly given the high lambda light chains)  -- it is unclear at this time if the amyloidosis noted on the kidney biopsy is confirmed AL amyloidosis. I would recommend send out to [facility] for mass spectrometry (if the sample is adequate) to determine which type of amyloid is present.  --patient has newly diagnosed, untreated Hepatitis B. This can be associated with amyloid deposits on the kidney and can resolve with antiviral treatment. Hepatitis B is classically associated with AA amyloidosis.  --there is no clear monoclonal protein on initial blood work (M protein negative on SPEP). Will order UPEP and order bone marrow biopsy to help r/o a monoclonal component. Without an abnormal plasma cell population or monoclonal protein the diagnosis of AL amyloidosis cannot be made and  alternative amyloid depositing dieases should be considered --additional workup to include repeat SPEP, SFLC, CBC, beta 2 microglobulin, and LDH --RTC pending the results of the bone marrow biopsy and above tests. Will plan for treatment to start shortly after bone marrow is performed. Mass spectrometry can take 3-4 weeks, can proceed with treatment if above labs are suspicious.   Orders Placed This Encounter  Procedures  . CT Biopsy    Standing Status:   Future    Standing Expiration Date:   08/23/2021    Order Specific Question:   Lab orders requested (DO NOT place separate lab orders, these will be automatically ordered during procedure specimen collection):    Answer:   Surgical Pathology    Order Specific Question:   Reason for Exam (SYMPTOM  OR DIAGNOSIS REQUIRED)    Answer:   concern for AL amyloidosis    Order Specific Question:   Preferred location?    Answer:   [facility]  . CT BONE MARROW BIOPSY & ASPIRATION    Standing Status:   Future    Standing Expiration Date:   08/23/2021    Order Specific Question:   Reason for Exam (SYMPTOM  OR DIAGNOSIS REQUIRED)    Answer:   Concern for AL amyloidosis, assess for plasma cell population/[place] amyloid    Order Specific Question:   Preferred location?    Answer:   [facility]  . CBC with Differential ([place] Only)    Standing Status:   Future    Number of Occurrences:   1    Standing Expiration Date:   08/23/2021  . Lactate dehydrogenase (LDH)    Standing Status:   Future    Number of Occurrences:   1    Standing Expiration Date:   08/23/2021  . Multiple Myeloma Panel (SPEP&IFE w/QIG)    Standing Status:   Future    Number of Occurrences:   1    Standing Expiration Date:   08/23/2021  . Kappa/lambda light chains    Standing Status:   Future    Number of Occurrences:   1    Standing Expiration  Date:   08/23/2021  . Beta 2 microglobulin    Standing Status:   Future    Number of Occurrences:   1     Standing Expiration Date:   08/23/2021  . 24-Hr Ur UPEP/UIFE/Light Chains/TP    Standing Status:   Future    Standing Expiration Date:   08/23/2021   All questions were answered. The patient knows to call the clinic with any problems, questions or concerns.  A total of more than 60 minutes were spent on this encounter and over half of that time was spent on counseling and coordination of care as outlined above.   Samuel Peoples, MD Department of Hematology/Oncology [facility] Phone: [phone] Pager: [phone] Email: [email or web address]  08/23/2020 6:07 PM

## 2020-08-24 ENCOUNTER — Ambulatory Visit: Payer: BC Managed Care – PPO | Admitting: Family Medicine

## 2020-08-24 ENCOUNTER — Encounter: Payer: Self-pay | Admitting: Hematology and Oncology

## 2020-08-24 DIAGNOSIS — R1909 Other intra-abdominal and pelvic swelling, mass and lump: Secondary | ICD-10-CM

## 2020-08-24 DIAGNOSIS — R1031 Right lower quadrant pain: Secondary | ICD-10-CM

## 2020-08-24 LAB — BETA 2 MICROGLOBULIN, SERUM: Beta-2 Microglobulin: 3.5 mg/L — ABNORMAL HIGH (ref 0.6–2.4)

## 2020-08-24 LAB — KAPPA/LAMBDA LIGHT CHAINS
Kappa free light chain: 18.5 mg/L (ref 3.3–19.4)
Kappa, lambda light chain ratio: 0.09 — ABNORMAL LOW (ref 0.26–1.65)
Lambda free light chains: 199.1 mg/L — ABNORMAL HIGH (ref 5.7–26.3)

## 2020-08-24 NOTE — Telephone Encounter (Signed)
I spoke with patient. He reports he continues to have pain in groin following PV procedure. He first stated he noticed a golf ball sized area in his groin this morning but then states he noticed bulging area yesterday that was golf ball sized.  Area was painful.  Today he pushed on the knot and heard fluid sound.  Knot seemed to get a little smaller at that time but is now back to the size it was yesterday. Patient reports pain has been going on since procedure. Will forward to Dr Gwenlyn Found for review/recommendations.

## 2020-08-25 ENCOUNTER — Ambulatory Visit (HOSPITAL_COMMUNITY): Payer: BC Managed Care – PPO

## 2020-08-25 DIAGNOSIS — E8581 Light chain (AL) amyloidosis: Secondary | ICD-10-CM | POA: Diagnosis not present

## 2020-08-28 ENCOUNTER — Other Ambulatory Visit: Payer: Self-pay | Admitting: Internal Medicine

## 2020-08-28 DIAGNOSIS — B181 Chronic viral hepatitis B without delta-agent: Secondary | ICD-10-CM

## 2020-08-28 LAB — MULTIPLE MYELOMA PANEL, SERUM
Albumin SerPl Elph-Mcnc: 1.7 g/dL — ABNORMAL LOW (ref 2.9–4.4)
Albumin/Glob SerPl: 0.7 (ref 0.7–1.7)
Alpha 1: 0.3 g/dL (ref 0.0–0.4)
Alpha2 Glob SerPl Elph-Mcnc: 1.3 g/dL — ABNORMAL HIGH (ref 0.4–1.0)
B-Globulin SerPl Elph-Mcnc: 0.8 g/dL (ref 0.7–1.3)
Gamma Glob SerPl Elph-Mcnc: 0.3 g/dL — ABNORMAL LOW (ref 0.4–1.8)
Globulin, Total: 2.7 g/dL (ref 2.2–3.9)
IgA: 70 mg/dL — ABNORMAL LOW (ref 90–386)
IgG (Immunoglobin G), Serum: 302 mg/dL — ABNORMAL LOW (ref 603–1613)
IgM (Immunoglobulin M), Srm: 40 mg/dL (ref 20–172)
Total Protein ELP: 4.4 g/dL — ABNORMAL LOW (ref 6.0–8.5)

## 2020-08-28 MED ORDER — VEMLIDY 25 MG PO TABS: 25.0000 mg | ORAL_TABLET | Freq: Every day | ORAL | 5 refills | Status: DC

## 2020-08-29 ENCOUNTER — Ambulatory Visit (HOSPITAL_COMMUNITY)
Admission: RE | Admit: 2020-08-29 | Discharge: 2020-08-29 | Disposition: A | Discharge status: Home / Self Care | Payer: BC Managed Care – PPO | Source: Ambulatory Visit | Attending: Internal Medicine | Admitting: Internal Medicine

## 2020-08-29 ENCOUNTER — Other Ambulatory Visit: Payer: Self-pay

## 2020-08-29 DIAGNOSIS — B181 Chronic viral hepatitis B without delta-agent: Secondary | ICD-10-CM | POA: Diagnosis not present

## 2020-08-29 LAB — LIVER FIBROSIS, FIBROTEST-ACTITEST
ALT: 30 U/L (ref 9–46)
Alpha-2-Macroglobulin: 391 mg/dL — ABNORMAL HIGH (ref 106–279)
Apolipoprotein A1: 192 mg/dL — ABNORMAL HIGH (ref 94–176)
Bilirubin: 0.4 mg/dL (ref 0.2–1.2)
Fibrosis Score: 0.22
GGT: 21 U/L (ref 3–70)
Haptoglobin: 446 mg/dL — ABNORMAL HIGH (ref 43–212)
Necroinflammat ACT Score: 0.13
Reference ID: 3593753

## 2020-08-29 LAB — UPEP/UIFE/LIGHT CHAINS/TP, 24-HR UR
% BETA, Urine: 8.4 %
ALPHA 1 URINE: 5.1 %
Albumin, U: 76.1 %
Alpha 2, Urine: 7.8 %
Free Kappa Lt Chains,Ur: 38.25 mg/L (ref 0.63–113.79)
Free Kappa/Lambda Ratio: 0.11 — ABNORMAL LOW (ref 1.03–31.76)
Free Lambda Lt Chains,Ur: 340.54 mg/L — ABNORMAL HIGH (ref 0.47–11.77)
GAMMA GLOBULIN URINE: 2.6 %
M-SPIKE %, Urine: 1.9 % — ABNORMAL HIGH
M-Spike, Mg/24 Hr: 164 mg/24 hr — ABNORMAL HIGH
Total Protein, Urine-Ur/day: 8620 mg/24 hr — ABNORMAL HIGH (ref 30–150)
Total Protein, Urine: 383.1 mg/dL
Total Volume: 2250

## 2020-08-29 LAB — COMPLETE METABOLIC PANEL WITH GFR
AG Ratio: 1.1 (calc) (ref 1.0–2.5)
ALT: 32 U/L (ref 9–46)
AST: 23 U/L (ref 10–35)
Albumin: 2.1 g/dL — ABNORMAL LOW (ref 3.6–5.1)
Alkaline phosphatase (APISO): 81 U/L (ref 35–144)
BUN: 13 mg/dL (ref 7–25)
CO2: 32 mmol/L (ref 20–32)
Calcium: 7.6 mg/dL — ABNORMAL LOW (ref 8.6–10.3)
Chloride: 106 mmol/L (ref 98–110)
Creat: 1.03 mg/dL (ref 0.70–1.25)
GFR, Est African American: 91 mL/min/{1.73_m2} (ref >=60)
GFR, Est Non African American: 79 mL/min/{1.73_m2} (ref >=60)
Globulin: 2 g/dL (calc) (ref 1.9–3.7)
Glucose, Bld: 115 mg/dL — ABNORMAL HIGH (ref 65–99)
Potassium: 3.8 mmol/L (ref 3.5–5.3)
Sodium: 145 mmol/L (ref 135–146)
Total Bilirubin: 0.5 mg/dL (ref 0.2–1.2)
Total Protein: 4.1 g/dL — ABNORMAL LOW (ref 6.1–8.1)

## 2020-08-29 LAB — HEPATITIS B DNA, ULTRAQUANTITATIVE, PCR
Hepatitis B DNA (Calc): 5.09 Log IU/mL — ABNORMAL HIGH
Hepatitis B DNA: 122000 IU/mL — ABNORMAL HIGH

## 2020-08-31 ENCOUNTER — Ambulatory Visit (INDEPENDENT_AMBULATORY_CARE_PROVIDER_SITE_OTHER): Payer: BC Managed Care – PPO | Admitting: Internal Medicine

## 2020-08-31 ENCOUNTER — Ambulatory Visit: Payer: BC Managed Care – PPO | Admitting: Cardiology

## 2020-08-31 ENCOUNTER — Encounter: Payer: Self-pay | Admitting: Cardiology

## 2020-08-31 ENCOUNTER — Other Ambulatory Visit: Payer: Self-pay | Admitting: Student

## 2020-08-31 ENCOUNTER — Other Ambulatory Visit: Payer: Self-pay

## 2020-08-31 ENCOUNTER — Encounter: Payer: Self-pay | Admitting: Internal Medicine

## 2020-08-31 VITALS — BP 120/80 | HR 91 | Temp 98.0°F | Wt 230.7 lb

## 2020-08-31 VITALS — BP 120/92 | HR 100 | Ht 71.0 in | Wt 230.8 lb

## 2020-08-31 DIAGNOSIS — F17200 Nicotine dependence, unspecified, uncomplicated: Secondary | ICD-10-CM

## 2020-08-31 DIAGNOSIS — N049 Nephrotic syndrome with unspecified morphologic changes: Secondary | ICD-10-CM | POA: Diagnosis not present

## 2020-08-31 DIAGNOSIS — E8581 Light chain (AL) amyloidosis: Secondary | ICD-10-CM

## 2020-08-31 DIAGNOSIS — E785 Hyperlipidemia, unspecified: Secondary | ICD-10-CM

## 2020-08-31 DIAGNOSIS — I10 Essential (primary) hypertension: Secondary | ICD-10-CM | POA: Diagnosis not present

## 2020-08-31 DIAGNOSIS — R1031 Right lower quadrant pain: Secondary | ICD-10-CM | POA: Insufficient documentation

## 2020-08-31 DIAGNOSIS — E119 Type 2 diabetes mellitus without complications: Secondary | ICD-10-CM

## 2020-08-31 DIAGNOSIS — E1151 Type 2 diabetes mellitus with diabetic peripheral angiopathy without gangrene: Secondary | ICD-10-CM | POA: Diagnosis not present

## 2020-08-31 DIAGNOSIS — B181 Chronic viral hepatitis B without delta-agent: Secondary | ICD-10-CM

## 2020-08-31 DIAGNOSIS — K4090 Unilateral inguinal hernia, without obstruction or gangrene, not specified as recurrent: Secondary | ICD-10-CM

## 2020-08-31 DIAGNOSIS — I739 Peripheral vascular disease, unspecified: Secondary | ICD-10-CM

## 2020-08-31 DIAGNOSIS — Z09 Encounter for follow-up examination after completed treatment for conditions other than malignant neoplasm: Secondary | ICD-10-CM

## 2020-08-31 LAB — POCT GLYCOSYLATED HEMOGLOBIN (HGB A1C): Hemoglobin A1C: 6.4 % — AB (ref 4.0–5.6)

## 2020-08-31 MED ORDER — ATORVASTATIN CALCIUM 80 MG PO TABS
80.0000 mg | ORAL_TABLET | Freq: Every day | ORAL | 2 refills | Status: DC
Start: 2020-08-31 — End: 2020-12-05

## 2020-08-31 MED ORDER — ERGOCALCIFEROL 1.25 MG (50000 UT) PO CAPS: 50000.0000 [IU] | ORAL_CAPSULE | ORAL | 0 refills | Status: DC

## 2020-08-31 NOTE — Assessment & Plan Note (Signed)
On oral agents 

## 2020-08-31 NOTE — Progress Notes (Signed)
Cardiology Office Note:    Date:  08/31/2020   ID:  Samuel Butler, DOB 12/03/1960, MRN [id number]  PCP:  Isaac Bliss, Rayford Halsted, MD  Cardiologist:  No primary care provider on file.  Electrophysiologist:  None   Referring MD: Isaac Bliss, [place]*   CC: DOE  History of Present Illness:    Samuel Butler is a 59 y.o. male with a hx of hypertension, dyslipidemia, non-insulin-dependent diabetes, smoking, and vascular disease. He was initially evaluated by Dr. Debara Pickett in April 2021 for dyslipidemia and lower extremity pain consistent with claudication. Echocardiogram done March 03, 2020 showed an ejection fraction of 65 to 70%. There was grade 1 diastolic dysfunction and moderate LVH. The left atrial size was mildly dilated. There was mild aortic regurgitation to moderate GERD rotation. Aortic root was 47 mm. He was referred to Dr. Gwenlyn Found for further [facility] evaluation. The patient ultimately underwent right SFA PTA on 06/26/2020.  The patient was then admitted 08/03/2020 with shortness of breath and lower extremity edema. He had apparently been seen as an outpatient for nephrotic syndrome and was on steroids. He was admitted with a diagnosis of anasarca and migrating arthralgias.Marland Kitchen He was discharged 08/08/2020. Subsequent outpatient work-up revealed the patient has chronic hepatitis B and amyloid by renal biopsy. He is due to have a bone marrow biopsy tomorrow. He is also had problems with swelling in his right groin, this appears to be a right inguinal hernia as opposed to a vascular complication from his intervention in August.  Earlier in September the patient had complained of some chest pain, this was before his admission. He was scheduled to have a coronary CTA but this was not done as he was in the hospital. At this point am not sure he needs this, he may need a cardiac MRI at some point. He has started treatment for his hepatitis B. From a cardiac standpoint he does have dyspnea on exertion.  An echocardiogram was done 08/05/2020. This showed his EF to be 65 to 70%. There were no wall motion abnormalities. There was severe concentric LVH. There was grade 1 diastolic dysfunction. There was mild AR, his aortic root was measured at 40 mm. His EKG shows NSR with poor anterior RW and NSST changes.  There appears to be less evidence of LVH on his EKG compared to his echo.    Past Medical History:  Diagnosis Date  . DDD (degenerative disc disease), lumbar    per his report  . Diabetes mellitus without complication ([place])   . Gout 04/16/2013  . Hyperlipidemia   . Hypertension   . IBD (inflammatory bowel disease) 04/05/2016  . Tobacco use disorder 03/28/2015    Past Surgical History:  Procedure Laterality Date  . ABDOMINAL AORTOGRAM W/LOWER EXTREMITY Right 06/26/2020   Procedure: ABDOMINAL AORTOGRAM W/LOWER EXTREMITY;  Surgeon: Lorretta Harp, MD;  Location: [facility];  Service: Cardiovascular;  Laterality: Right;  . COLONOSCOPY    . PERIPHERAL VASCULAR INTERVENTION  06/26/2020   Procedure: PERIPHERAL VASCULAR INTERVENTION;  Surgeon: Lorretta Harp, MD;  Location: [facility];  Service: Cardiovascular;;  Right SFA  . TONSILLECTOMY      Current Medications: Current Meds  Medication Sig  . acetaminophen (TYLENOL) 650 MG CR tablet Take 650 mg by mouth every 8 (eight) hours as needed for pain.  Marland Kitchen aspirin EC 81 MG EC tablet Take 1 tablet (81 mg total) by mouth daily. Swallow whole.  Marland Kitchen atorvastatin (LIPITOR) 80 MG tablet  Take 1 tablet (80 mg total) by mouth daily.  . clopidogrel (PLAVIX) 75 MG tablet Take 1 tablet (75 mg total) by mouth daily with breakfast.  . ergocalciferol (VITAMIN D2) 1.25 MG (50000 UT) capsule Take 1 capsule (50,000 Units total) by mouth once a week.  . isosorbide mononitrate (IMDUR) 30 MG 24 hr tablet Take 1 tablet (30 mg total) by mouth daily.  Marland Kitchen losartan (COZAAR) 50 MG tablet Take 1 tablet (50 mg total) by mouth daily.  . metFORMIN (GLUCOPHAGE)  500 MG tablet Take 1 tablet (500 mg total) by mouth daily with breakfast.  . pantoprazole (PROTONIX) 40 MG tablet Take 1 tablet (40 mg total) by mouth daily.  . potassium chloride SA (KLOR-CON) 20 MEQ tablet Take 1 tablet (20 mEq total) by mouth daily.  Marland Kitchen torsemide (DEMADEX) 20 MG tablet Take 20 mg by mouth 2 (two) times daily.     Allergies:   Crestor [rosuvastatin calcium]   Social History   Socioeconomic History  . Marital status: Married    Spouse name: Not on file  . Number of children: Not on file  . Years of education: Not on file  . Highest education level: Not on file  Occupational History  . Not on file  Tobacco Use  . Smoking status: Former Smoker    Types: Cigarettes    Quit date: 10/23/2016    Years since quitting: 3.8  . Smokeless tobacco: Never Used  . Tobacco comment: per patient 5 cigarettes a day   Vaping Use  . Vaping Use: Never used  Substance and Sexual Activity  . Alcohol use: Yes    Comment: occasional  . Drug use: No  . Sexual activity: Not on file  Other Topics Concern  . 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 Difficulty of Paying Living Expenses: Not on file  Food Insecurity:   . Worried About Charity fundraiser in the Last Year: Not on file  . Ran Out of Food in the Last Year: Not on file  Transportation Needs:   . Lack of Transportation (Medical): Not on file  . Lack of Transportation (Non-Medical): Not on file  Physical Activity:   . Days of Exercise per Week: Not on file  . Minutes of Exercise per Session: Not on file  Stress:   . Feeling of Stress : Not on file  Social Connections:   . Frequency of Communication with Friends and Family: Not on file  . Frequency of Social Gatherings  with Friends and Family: Not on file  . Attends Religious Services: Not on file  . Active Member of Clubs or Organizations: Not on file  . Attends Archivist Meetings: Not on file  . Marital Status: Not on file     Family History: The patient's family history includes Diabetes in his father; Hypertension in his father; Stroke in his father.  ROS:   Please see the history of present illness.     All other systems reviewed and are negative.  EKGs/Labs/Other Studies Reviewed:    The following studies were reviewed today: Echo 08/05/2020- IMPRESSIONS    1. Left  ventricular ejection fraction, by estimation, is 65 to 70%. The  left ventricle has normal function. The left ventricle has no regional  wall motion abnormalities. There is severe concentric left ventricular  hypertrophy. Left ventricular diastolic  parameters are consistent with Grade I diastolic dysfunction (impaired  relaxation). Elevated left ventricular end-diastolic pressure.  2. Right ventricular systolic function is normal. The right ventricular  size is normal. There is moderately elevated pulmonary artery systolic  pressure.  3. Left atrial size was mildly dilated.  4. The mitral valve is grossly normal. Trivial mitral valve  regurgitation.  5. The aortic valve is tricuspid. Aortic valve regurgitation is mild.  Aortic regurgitation PHT measures 440 msec.  6. Aortic dilatation noted. There is mild dilatation of the ascending  aorta, measuring 40 mm.  7. The inferior vena cava is dilated in size with <50% respiratory  variability, suggesting right atrial pressure of 15 mmHg.   Comparison(s): Changes from prior study are noted. 03/03/20: LVEF 65-70%,  grade 1 DD, elevated LA pressue, RVSP 33 mmHg.   EKG:  EKG is ordered today.  The ekg ordered today demonstrates NSR- HR 100, poor anterior RW, QTc 521, diffuse NSST changes  Recent Labs: 05/25/2020: TSH 1.52 08/22/2020: ALT 32; ALT 30; BUN 13;  Creat 1.03; Potassium 3.8; Sodium 145 08/23/2020: Hemoglobin 13.1; Platelet Count 164  Recent Lipid Panel    Component Value Date/Time   CHOL 255 (H) 08/06/2020 0417   TRIG 74 08/06/2020 0417   HDL 49 08/06/2020 0417   CHOLHDL 5.2 08/06/2020 0417   VLDL 15 08/06/2020 0417   LDLCALC 191 (H) 08/06/2020 0417   LDLCALC 278 (H) 05/25/2020 0849   LDLDIRECT 144.7 10/08/2013 0822    Physical Exam:    VS:  BP (!) 120/92   Pulse 100   Ht 5' 11" (1.803 m)   Wt 230 lb 12.8 oz (104.7 kg)   SpO2 95%   BMI 32.19 kg/m     Wt Readings from Last 3 Encounters:  08/31/20 230 lb 12.8 oz (104.7 kg)  08/31/20 230 lb 11.2 oz (104.6 kg)  08/23/20 233 lb 8 oz (105.9 kg)     GEN: Well nourished, well developed in no acute distress HEENT: Normal NECK: No JVD; No carotid bruits CARDIAC: RRR, no murmurs, rubs, gallops-decreased heart sounds RESPIRATORY:  Clear to auscultation without rales, wheezing or rhonchi  ABDOMEN: Soft, non-tender, non-distended MUSCULOSKELETAL:  No edema; No deformity-no RFA bruit  SKIN: Warm and dry NEUROLOGIC:  Alert and oriented x 3 PSYCHIATRIC:  Normal affect   ASSESSMENT:    Amyloidosis ([place]) Work up in progress- it appears he may have cardiac involvement based on his EKG and echo.  Will cancel his coronary CTA.  He may need cardiac MRI, will allow him to complete current work up.   Chronic viral hepatitis B without delta-agent (HCC) With evidence of cirrhosis.  On treatment  Essential hypertension, benign Controlled  Nephrotic syndrome Noted proteinuria and AKI in Sept 2021- renal biopsy showed amyloid  Peripheral arterial disease ([place]) RSFA PTA Aug 2021- right groin swelling appears to be an inguinal hernia - no evidence of pseudoaneurysm on exam  Non-insulin treated type 2 diabetes mellitus ([place]) On oral agents  PLAN:    I will review with the patient's primary cardiologist- Dr Debara Pickett.  Consider cardiac MRI at some point but will allow him to complete  work up (his bone marrow biopsy is tomorrow).    Medication Adjustments/Labs and Tests Ordered: Current medicines are reviewed  at length with the patient today.  Concerns regarding medicines are outlined above.  Orders Placed This Encounter  Procedures  . EKG 12-Lead   No orders of the defined types were placed in this encounter.   Patient Instructions  Medication Instructions:  Continue current medications  *If you need a refill on your cardiac medications before your next appointment, please call your pharmacy*   Lab Work: None Ordered   Testing/Procedures: None Ordered   Follow-Up: At [facility], you and your health needs are our priority.  As part of our continuing mission to provide you with exceptional heart care, we have created designated Provider Care Teams.  These Care Teams include your primary Cardiologist (physician) and Advanced Practice Providers (APPs -  Physician Assistants and Nurse Practitioners) who all work together to provide you with the care you need, when you need it.  We recommend signing up for the patient portal called "MyChart".  Sign up information is provided on this After Visit Summary.  MyChart is used to connect with patients for Virtual Visits (Telemedicine).  Patients are able to view lab/test results, encounter notes, upcoming appointments, etc.  Non-urgent messages can be sent to your provider as well.   To learn more about what you can do with MyChart, go to [email or web address].         Signed, Kerin Ransom, PA-C  08/31/2020 3:05 PM    [facility]

## 2020-08-31 NOTE — Assessment & Plan Note (Signed)
Work up in progress- it appears he may have cardiac involvement based on his EKG and echo.  Will cancel his coronary CTA.  He may need cardiac MRI, will allow him to complete current work up.

## 2020-08-31 NOTE — Assessment & Plan Note (Signed)
Controlled.  

## 2020-08-31 NOTE — Progress Notes (Signed)
Established Patient Office Visit     This visit occurred during the SARS-CoV-2 public health emergency.  Safety protocols were in place, including screening questions prior to the visit, additional usage of staff PPE, and extensive cleaning of exam room while observing appropriate contact time as indicated for disinfecting solutions.    CC/Reason for Visit: Hospital discharge follow-up, follow-up chronic medical conditions  HPI: Samuel Butler is a 59 y.o. male who is coming in today for the above mentioned reasons. Past Medical History is significant for:  Hypertension, hyperlipidemia with a very elevated LDL of 278 on last check in July, erectile dysfunction, inflammatory bowel disease, type 2 diabetes that is well controlled on Metformin.  He has peripheral arterial disease and just had stenting of his right SFA by Dr. Gwenlyn Found on August 16.  Since I last saw him he has had some significant changes to his medical record.  He had a kidney biopsy due to foamy urine, anasarca and nephrotic syndrome. Biopsy confirms some amyloid deposits.  During the work-up for this he was found to have chronic hepatitis B viral infection and has been started on tenofovir by infectious diseases.  He had an abdominal ultrasound on 10/19 that seems to confirm hepatic cirrhosis.  He has also been seen by oncology and he has a bone marrow biopsy scheduled for tomorrow.  Further management of his presumptive amyloidosis will be pending these results.  He continues to have multiple area joint pain but more prominent of his wrists and ankles, I wonder if amyloid deposits may be part of this.  He would like to show me a bulge that he has in his right inguinal area.  This became more prominent after his right SFA stent in August.  It is nonpainful.  He can push it in.  If he strains to have a bowel movement it comes out.  He does not feel constipated.  He is due for flu vaccine but is declining today, he seems  overwhelmed by his recent diagnoses.  He comes in today with his wife.   Past Medical/Surgical History: Past Medical History:  Diagnosis Date  . DDD (degenerative disc disease), lumbar    per his report  . Diabetes mellitus without complication ([place])   . Gout 04/16/2013  . Hyperlipidemia   . Hypertension   . IBD (inflammatory bowel disease) 04/05/2016  . Tobacco use disorder 03/28/2015    Past Surgical History:  Procedure Laterality Date  . ABDOMINAL AORTOGRAM W/LOWER EXTREMITY Right 06/26/2020   Procedure: ABDOMINAL AORTOGRAM W/LOWER EXTREMITY;  Surgeon: Lorretta Harp, MD;  Location: [facility];  Service: Cardiovascular;  Laterality: Right;  . COLONOSCOPY    . PERIPHERAL VASCULAR INTERVENTION  06/26/2020   Procedure: PERIPHERAL VASCULAR INTERVENTION;  Surgeon: Lorretta Harp, MD;  Location: [place] [facility];  Service: Cardiovascular;;  Right SFA  . TONSILLECTOMY      Social History:  reports that he quit smoking about 3 years ago. His smoking use included cigarettes. He has never used smokeless tobacco. He reports current alcohol use. He reports that he does not use drugs.  Allergies: Allergies  Allergen Reactions  . Crestor [Rosuvastatin Calcium] Other (See Comments)    Joint aches    Family History:  Family History  Problem Relation Age of Onset  . Hypertension Father   . Diabetes Father   . Stroke Father      Current Outpatient Medications:  .  acetaminophen (TYLENOL) 650 MG CR  tablet, Take 650 mg by mouth every 8 (eight) hours as needed for pain., Disp: , Rfl:  .  aspirin EC 81 MG EC tablet, Take 1 tablet (81 mg total) by mouth daily. Swallow whole., Disp: 30 tablet, Rfl: 11 .  atorvastatin (LIPITOR) 80 MG tablet, Take 1 tablet (80 mg total) by mouth daily., Disp: 90 tablet, Rfl: 2 .  clopidogrel (PLAVIX) 75 MG tablet, Take 1 tablet (75 mg total) by mouth daily with breakfast., Disp: 90 tablet, Rfl: 3 .  ergocalciferol (VITAMIN D2) 1.25 MG (50000 UT)  capsule, Take 50,000 Units by mouth once a week., Disp: , Rfl:  .  isosorbide mononitrate (IMDUR) 30 MG 24 hr tablet, Take 1 tablet (30 mg total) by mouth daily., Disp: 90 tablet, Rfl: 1 .  losartan (COZAAR) 50 MG tablet, Take 1 tablet (50 mg total) by mouth daily., Disp: 90 tablet, Rfl: 3 .  metFORMIN (GLUCOPHAGE) 500 MG tablet, Take 1 tablet (500 mg total) by mouth daily with breakfast., Disp: 90 tablet, Rfl: 1 .  pantoprazole (PROTONIX) 40 MG tablet, Take 1 tablet (40 mg total) by mouth daily., Disp: 90 tablet, Rfl: 1 .  potassium chloride SA (KLOR-CON) 20 MEQ tablet, Take 1 tablet (20 mEq total) by mouth daily., Disp: 30 tablet, Rfl: 0 .  Tenofovir Alafenamide Fumarate (VEMLIDY) 25 MG TABS, Take 1 tablet (25 mg total) by mouth daily., Disp: 30 tablet, Rfl: 5 .  torsemide (DEMADEX) 20 MG tablet, Take 20 mg by mouth 2 (two) times daily., Disp: , Rfl:  .  FARXIGA 10 MG TABS tablet, Take 10 mg by mouth daily. (Patient not taking: Reported on 08/23/2020), Disp: , Rfl:   Review of Systems:  Constitutional: Denies fever, chills, diaphoresis. HEENT: Denies photophobia, eye pain, redness, hearing loss, ear pain, congestion, sore throat, rhinorrhea, sneezing, mouth sores, trouble swallowing, neck pain, neck stiffness and tinnitus.   Respiratory: Denies SOB, DOE, cough, chest tightness,  and wheezing.   Cardiovascular: Denies chest pain, palpitations and leg swelling.  Gastrointestinal: Denies nausea, vomiting, abdominal pain, diarrhea, constipation, blood in stool and abdominal distention.  Genitourinary: Denies dysuria, urgency, frequency, hematuria, flank pain and difficulty urinating.  Endocrine: Denies: hot or cold intolerance, sweats, changes in hair or nails, polyuria, polydipsia. Musculoskeletal: Denies myalgias, back pain, joint swelling, arthralgias and gait problem.  Skin: Denies pallor, rash and wound.  Neurological: Denies dizziness, seizures, syncope, weakness, light-headedness, numbness  and headaches.  Hematological: Denies adenopathy. Easy bruising, personal or family bleeding history  Psychiatric/Behavioral: Denies suicidal ideation, mood changes, confusion, nervousness, sleep disturbance and agitation    Physical Exam: Vitals:   08/31/20 0735  BP: 120/80  Pulse: 91  Temp: 98 F (36.7 C)  TempSrc: Oral  SpO2: 97%  Weight: 230 lb 11.2 oz (104.6 kg)    Body mass index is 32.18 kg/m.   Constitutional: NAD, calm, comfortable Eyes: PERRL, lids and conjunctivae normal ENMT: Mucous membranes are moist.  Respiratory: clear to auscultation bilaterally, no wheezing, no crackles. Normal respiratory effort. No accessory muscle use.  Cardiovascular: Regular rate and rhythm, no murmurs / rubs / gallops. No extremity edema.   Abdomen: Right inguinal bulge, easily reducible. Neurologic: CN 2-12 grossly intact. Sensation intact, DTR normal. Strength 5/5 in all 4.  Psychiatric: Normal judgment and insight. Alert and oriented x 3. Normal mood.    Impression and Plan:  Hospital discharge follow-up  Type 2 diabetes mellitus with diabetic peripheral angiopathy without gangrene, without long-term current use of insulin ([place])  -Well-controlled with  an A1c of 6.4, he is on Metformin and [place].  Chronic viral hepatitis B without delta agent and without coma ([facility]) -Has been started on tenofovir by infectious diseases, Dr. Juleen China.  Light chain (AL) amyloidosis (HCC) Nephrotic syndrome -Has bone marrow biopsy scheduled for tomorrow, is being followed by oncology, Dr. Lorenso Courier. -His nephrologist is Dr. Marval Regal  Essential hypertension, benign -Well-controlled.  Hyperlipidemia, unspecified hyperlipidemia type -Very uncontrolled with a recent LDL of 191, he is on maximum dose statin that was just started earlier this month.  Right inguinal hernia -This appears to be the most likely etiology of his right inguinal bulge. -As long as this does not become an emergency, I  would recommend observation for now given the extensive ongoing work-up for his presumptive amyloidosis and treatment of hepatitis B viral infection.    Time spent: 40 minutes. Greater than 50% of this time was spent in direct contact with the patient and his wife, coordinating care and discussing relevant ongoing clinical issues, including extensive chart review, answering patient's and wife's questions.    Patient Instructions  -Nice seeing you today!!  -Come see me as needed for now.     Lelon Frohlich, MD [facility]

## 2020-08-31 NOTE — Assessment & Plan Note (Signed)
RSFA PTA Aug 2021- right groin swelling appears to be an inguinal hernia - no evidence of pseudoaneurysm on exam

## 2020-08-31 NOTE — Patient Instructions (Signed)
Medication Instructions:  Continue current medications  *If you need a refill on your cardiac medications before your next appointment, please call your pharmacy*   Lab Work: None Ordered   Testing/Procedures: None Ordered   Follow-Up: At [facility], you and your health needs are our priority.  As part of our continuing mission to provide you with exceptional heart care, we have created designated Provider Care Teams.  These Care Teams include your primary Cardiologist (physician) and Advanced Practice Providers (APPs -  Physician Assistants and Nurse Practitioners) who all work together to provide you with the care you need, when you need it.  We recommend signing up for the patient portal called "MyChart".  Sign up information is provided on this After Visit Summary.  MyChart is used to connect with patients for Virtual Visits (Telemedicine).  Patients are able to view lab/test results, encounter notes, upcoming appointments, etc.  Non-urgent messages can be sent to your provider as well.   To learn more about what you can do with MyChart, go to [email or web address].

## 2020-08-31 NOTE — Assessment & Plan Note (Signed)
With evidence of cirrhosis.  On treatment

## 2020-08-31 NOTE — Assessment & Plan Note (Signed)
Noted proteinuria and AKI in Sept 2021- renal biopsy showed amyloid

## 2020-08-31 NOTE — Patient Instructions (Signed)
-  Nice seeing you today!!  -Come see me as needed for now.

## 2020-09-01 ENCOUNTER — Telehealth (HOSPITAL_COMMUNITY): Payer: Self-pay | Admitting: Emergency Medicine

## 2020-09-01 ENCOUNTER — Ambulatory Visit (HOSPITAL_COMMUNITY)
Admission: RE | Admit: 2020-09-01 | Discharge: 2020-09-01 | Disposition: A | Discharge status: Home / Self Care | Payer: BC Managed Care – PPO | Source: Ambulatory Visit | Attending: Hematology and Oncology | Admitting: Hematology and Oncology

## 2020-09-01 ENCOUNTER — Other Ambulatory Visit: Payer: Self-pay

## 2020-09-01 ENCOUNTER — Encounter (HOSPITAL_COMMUNITY): Payer: Self-pay

## 2020-09-01 DIAGNOSIS — E8581 Light chain (AL) amyloidosis: Secondary | ICD-10-CM

## 2020-09-01 DIAGNOSIS — R897 Abnormal histological findings in specimens from other organs, systems and tissues: Secondary | ICD-10-CM | POA: Diagnosis not present

## 2020-09-01 DIAGNOSIS — Z888 Allergy status to other drugs, medicaments and biological substances status: Secondary | ICD-10-CM | POA: Diagnosis not present

## 2020-09-01 DIAGNOSIS — D649 Anemia, unspecified: Secondary | ICD-10-CM | POA: Diagnosis not present

## 2020-09-01 LAB — CBC WITH DIFFERENTIAL/PLATELET
Abs Immature Granulocytes: 0.01 10*3/uL (ref 0.00–0.07)
Basophils Absolute: 0 10*3/uL (ref 0.0–0.1)
Basophils Relative: 0 %
Eosinophils Absolute: 0.3 10*3/uL (ref 0.0–0.5)
Eosinophils Relative: 5 %
HCT: 38.8 % — ABNORMAL LOW (ref 39.0–52.0)
Hemoglobin: 12.8 g/dL — ABNORMAL LOW (ref 13.0–17.0)
Immature Granulocytes: 0 %
Lymphocytes Relative: 39 %
Lymphs Abs: 2.2 10*3/uL (ref 0.7–4.0)
MCH: 31 pg (ref 26.0–34.0)
MCHC: 33 g/dL (ref 30.0–36.0)
MCV: 93.9 fL (ref 80.0–100.0)
Monocytes Absolute: 0.6 10*3/uL (ref 0.1–1.0)
Monocytes Relative: 10 %
Neutro Abs: 2.6 10*3/uL (ref 1.7–7.7)
Neutrophils Relative %: 46 %
Platelets: 196 10*3/uL (ref 150–400)
RBC: 4.13 MIL/uL — ABNORMAL LOW (ref 4.22–5.81)
RDW: 15.9 % — ABNORMAL HIGH (ref 11.5–15.5)
WBC: 5.6 10*3/uL (ref 4.0–10.5)
nRBC: 0 % (ref 0.0–0.2)

## 2020-09-01 LAB — GLUCOSE, CAPILLARY: Glucose-Capillary: 90 mg/dL (ref 70–99)

## 2020-09-01 MED ORDER — HYDROCODONE-ACETAMINOPHEN 5-325 MG PO TABS: 1.0000 | ORAL_TABLET | ORAL | Status: DC | PRN

## 2020-09-01 MED ORDER — MIDAZOLAM HCL 2 MG/2ML IJ SOLN
INTRAMUSCULAR | Status: AC
  Filled 2020-09-01: qty 4

## 2020-09-01 MED ORDER — FLUMAZENIL 0.5 MG/5ML IV SOLN
INTRAVENOUS | Status: AC
  Filled 2020-09-01: qty 5

## 2020-09-01 MED ORDER — FENTANYL CITRATE (PF) 100 MCG/2ML IJ SOLN
INTRAMUSCULAR | Status: AC | PRN
Start: 2020-09-01 — End: 2020-09-01
  Administered 2020-09-01 (×2): 50 ug via INTRAVENOUS

## 2020-09-01 MED ORDER — FENTANYL CITRATE (PF) 100 MCG/2ML IJ SOLN
INTRAMUSCULAR | Status: AC
  Filled 2020-09-01: qty 4

## 2020-09-01 MED ORDER — MIDAZOLAM HCL 2 MG/2ML IJ SOLN
INTRAMUSCULAR | Status: AC | PRN
  Administered 2020-09-01: 2 mg via INTRAVENOUS
  Administered 2020-09-01: 1 mg via INTRAVENOUS

## 2020-09-01 MED ORDER — LIDOCAINE HCL (PF) 1 % IJ SOLN
INTRAMUSCULAR | Status: AC | PRN
  Administered 2020-09-01: 10 mL via INTRADERMAL

## 2020-09-01 MED ORDER — SODIUM CHLORIDE 0.9 % IV SOLN: INTRAVENOUS | Status: DC

## 2020-09-01 MED ORDER — NALOXONE HCL 0.4 MG/ML IJ SOLN
INTRAMUSCULAR | Status: AC
  Filled 2020-09-01: qty 1

## 2020-09-01 NOTE — Discharge Instructions (Addendum)
Please call Interventional Radiology clinic [phone] with any questions or concerns.  You may remove your dressing and shower tomorrow.   Bone Marrow Aspiration and Bone Marrow Biopsy, Adult, Care After This sheet gives you information about how to care for yourself after your procedure. Your health care provider may also give you more specific instructions. If you have problems or questions, contact your health care provider. What can I expect after the procedure? After the procedure, it is common to have:  Mild pain and tenderness.  Swelling.  Bruising. Follow these instructions at home: Puncture site care   Follow instructions from your health care provider about how to take care of the puncture site. Make sure you: ? Wash your hands with soap and water before and after you change your bandage (dressing). If soap and water are not available, use hand sanitizer. ? Change your dressing as told by your health care provider.  Check your puncture site every day for signs of infection. Check for: ? More redness, swelling, or pain. ? Fluid or blood. ? Warmth. ? Pus or a bad smell. Activity  Return to your normal activities as told by your health care provider. Ask your health care provider what activities are safe for you.  Do not lift anything that is heavier than 10 lb (4.5 kg), or the limit that you are told, until your health care provider says that it is safe.  Do not drive for 24 hours if you were given a sedative during your procedure. General instructions   Take over-the-counter and prescription medicines only as told by your health care provider.  Do not take baths, swim, or use a hot tub until your health care provider approves. Ask your health care provider if you may take showers. You may only be allowed to take sponge baths.  If directed, put ice on the affected area. To do this: ? Put ice in a plastic bag. ? Place a towel between your skin and the  bag. ? Leave the ice on for 20 minutes, 2-3 times a day.  Keep all follow-up visits as told by your health care provider. This is important. Contact a health care provider if:  Your pain is not controlled with medicine.  You have a fever.  You have more redness, swelling, or pain around the puncture site.  You have fluid or blood coming from the puncture site.  Your puncture site feels warm to the touch.  You have pus or a bad smell coming from the puncture site. Summary  After the procedure, it is common to have mild pain, tenderness, swelling, and bruising.  Follow instructions from your health care provider about how to take care of the puncture site and what activities are safe for you.  Take over-the-counter and prescription medicines only as told by your health care provider.  Contact a health care provider if you have any signs of infection, such as fluid or blood coming from the puncture site. This information is not intended to replace advice given to you by your health care provider. Make sure you discuss any questions you have with your health care provider. Document Revised: 03/16/2019 Document Reviewed: 03/16/2019 Elsevier Patient Education  2020 [employer]   Moderate Conscious Sedation, Adult, Care After These instructions provide you with information about caring for yourself after your procedure. Your health care provider may also give you more specific instructions. Your treatment has been planned according to current medical practices, but problems sometimes occur. Call   your health care provider if you have any problems or questions after your procedure. What can I expect after the procedure? After your procedure, it is common:  To feel sleepy for several hours.  To feel clumsy and have poor balance for several hours.  To have poor judgment for several hours.  To vomit if you eat too soon. Follow these instructions at home: For at least 24 hours after  the procedure:   Do not: ? Participate in activities where you could fall or become injured. ? Drive. ? Use heavy machinery. ? Drink alcohol. ? Take sleeping pills or medicines that cause drowsiness. ? Make important decisions or sign legal documents. ? Take care of children on your own.  Rest. Eating and drinking  Follow the diet recommended by your health care provider.  If you vomit: ? Drink water, juice, or soup when you can drink without vomiting. ? Make sure you have little or no nausea before eating solid foods. General instructions  Have a responsible adult stay with you until you are awake and alert.  Take over-the-counter and prescription medicines only as told by your health care provider.  If you smoke, do not smoke without supervision.  Keep all follow-up visits as told by your health care provider. This is important. Contact a health care provider if:  You keep feeling nauseous or you keep vomiting.  You feel light-headed.  You develop a rash.  You have a fever. Get help right away if:  You have trouble breathing. This information is not intended to replace advice given to you by your health care provider. Make sure you discuss any questions you have with your health care provider. Document Revised: 10/10/2017 Document Reviewed: 02/17/2016 Elsevier Patient Education  2020 Elsevier Inc.  

## 2020-09-01 NOTE — Procedures (Signed)
Interventional Radiology Procedure Note  Procedure: CT guided aspirate and core biopsy of right iliac bone  Complications: None  Recommendations: - Bedrest supine x 1 hrs - Hydrocodone PRN  Pain - Follow biopsy results   Siona Coulston, MD   

## 2020-09-01 NOTE — H&P (Signed)
Referring Physician(s): [place]  Supervising Physician: Suttle,D  Patient Status:  [facility]  Chief Complaint:  "I'm having a bone marrow biopsy"  Subjective: Patient familiar to IR service from random renal biopsy on 08/07/2020.  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 He presents today for CT-guided bone marrow biopsy to assess for plasma cell population/Congo red amyloid.  He currently denies fever, headache, chest pain, dyspnea, cough, back pain, nausea, vomiting or bleeding.  He does have occasional abdominal discomfort and dry mouth.  Past Medical History:  Diagnosis Date  . DDD (degenerative disc disease), lumbar    per his report  . Diabetes mellitus without complication ([place])   . Gout 04/16/2013  . Hyperlipidemia   . Hypertension   . IBD (inflammatory bowel disease) 04/05/2016  . Tobacco use disorder 03/28/2015   Past Surgical History:  Procedure Laterality Date  . ABDOMINAL AORTOGRAM W/LOWER EXTREMITY Right 06/26/2020   Procedure: ABDOMINAL AORTOGRAM W/LOWER EXTREMITY;  Surgeon: Lorretta Harp, MD;  Location: [facility];  Service: Cardiovascular;  Laterality: Right;  . COLONOSCOPY    . PERIPHERAL VASCULAR INTERVENTION  06/26/2020   Procedure: PERIPHERAL VASCULAR INTERVENTION;  Surgeon: Lorretta Harp, MD;  Location: [facility];  Service: Cardiovascular;;  Right SFA  . TONSILLECTOMY       Allergies: Crestor [rosuvastatin calcium]  Medications: Prior to Admission medications   Medication Sig Start Date End Date Taking? Authorizing Provider  acetaminophen (TYLENOL) 650 MG CR tablet Take 650 mg by mouth every 8 (eight) hours as needed for pain.   Yes [provider]  aspirin EC 81 MG EC tablet Take 1 tablet (81 mg total) by mouth daily. Swallow whole. 06/27/20  Yes Kathyrn Drown D, NP  atorvastatin  (LIPITOR) 80 MG tablet Take 1 tablet (80 mg total) by mouth daily. 08/31/20  Yes Isaac Bliss, Rayford Halsted, MD  clopidogrel (PLAVIX) 75 MG tablet Take 1 tablet (75 mg total) by mouth daily with breakfast. 06/27/20  Yes Kathyrn Drown D, NP  ergocalciferol (VITAMIN D2) 1.25 MG (50000 UT) capsule Take 1 capsule (50,000 Units total) by mouth once a week. 08/31/20  Yes Isaac Bliss, Rayford Halsted, MD  isosorbide mononitrate (IMDUR) 30 MG 24 hr tablet Take 1 tablet (30 mg total) by mouth daily. 07/14/20  Yes Isaac Bliss, Rayford Halsted, MD  losartan (COZAAR) 50 MG tablet Take 1 tablet (50 mg total) by mouth daily. 07/25/20  Yes Lorretta Harp, MD  metFORMIN (GLUCOPHAGE) 500 MG tablet Take 1 tablet (500 mg total) by mouth daily with breakfast. 05/25/20  Yes Isaac Bliss, Rayford Halsted, MD  pantoprazole (PROTONIX) 40 MG tablet Take 1 tablet (40 mg total) by mouth daily. 07/14/20  Yes Isaac Bliss, Rayford Halsted, MD  potassium chloride SA (KLOR-CON) 20 MEQ tablet Take 1 tablet (20 mEq total) by mouth daily. 08/09/20  Yes Vann, Jessica U, DO  torsemide (DEMADEX) 20 MG tablet Take 20 mg by mouth 2 (two) times daily. 08/19/20  Yes [provider]     Vital Signs: BP (!) 124/98 (BP Location: Right Arm)   Pulse 98   Temp 98.9 F (37.2 C) (Oral)   Resp 18   Ht 5' 11"  (1.803 m)   SpO2 96%   BMI 32.19 kg/m   Physical Exam awake, alert.  Chest clear to auscultation bilaterally.  Heart with regular rate and rhythm.  Abdomen soft, positive bowel sounds, currently nontender.  Bilateral pretibial edema noted.  Imaging: US ABDOMEN COMPLETE W/ELASTOGRAPHY  Result Date: 08/29/2020 CLINICAL DATA:  Chronic hepatitis B, hepatocellular carcinoma screening EXAM: ULTRASOUND ABDOMEN ULTRASOUND HEPATIC ELASTOGRAPHY TECHNIQUE: Sonography of the upper abdomen was performed. In addition, ultrasound elastography evaluation of the liver was performed. A region of interest was placed within the right lobe of the liver.  Following application of a compressive sonographic pulse, tissue compressibility was assessed. Multiple assessments were performed at the selected site. Median tissue compressibility was determined. Previously, hepatic stiffness was assessed by shear wave velocity. Based on recently published Society of Radiologists in Ultrasound consensus article, reporting is now recommended to be performed in the SI units of pressure (kiloPascals) representing hepatic stiffness/elasticity. The obtained result is compared to the published reference standards. (cACLD = compensated Advanced Chronic Liver Disease) COMPARISON:  CT abdomen and pelvis 03/23/2009 FINDINGS: ULTRASOUND ABDOMEN Gallbladder: Normally distended without stones or wall thickening. No pericholecystic fluid or sonographic Murphy sign. Common bile duct: Diameter: 4 mm, normal Liver: Slightly heterogeneous parenchymal echogenicity. Mildly nodular hepatic contour. Scattered calcified granulomata as noted on prior CT. No discrete hepatic mass visualized. No intrahepatic biliary dilatation. Portal vein is patent on color Doppler imaging with normal direction of blood flow towards the liver. IVC: Normal appearance Pancreas: Normal appearance Spleen: Suboptimally visualized due to to poor acoustic window and shadowing from lung base. Few calcified granulomata. No focal mass. Right Kidney: Length: 12.7 cm. Normal cortical thickness. Increased cortical echogenicity. Multiple cysts, largest 2.3 x 1.8 x 1.6 cm at upper pole, simple in character. No hydronephrosis. Left Kidney: Length: 13.1 cm. Normal cortical thickness. Increased cortical echogenicity. Multiple cysts, largest at upper pole 9.1 x 3.8 x 4.3 cm, complex and containing multiple septations, markedly increased since 2010. No hydronephrosis. Abdominal aorta: Normal caliber Other findings: No free fluid ULTRASOUND HEPATIC ELASTOGRAPHY Device: Siemens Helix VTQ Patient position: Not specified Transducer 5C1 Number  of measurements: 10 Hepatic segment:  8 Median kPa: 3.3 IQR: 0.6 IQR/Median kPa ratio: 0.2 Data quality: Exam degraded by motion Diagnostic category:  < or = 5 kPa: high probability of being normal The use of hepatic elastography is applicable to patients with viral hepatitis and non-alcoholic fatty liver disease. At this time, there is insufficient data for the referenced cut-off values and use in other causes of liver disease, including alcoholic liver disease. Patients, however, may be assessed by elastography and serve as their own reference standard/baseline. In patients with non-alcoholic liver disease, the values suggesting compensated advanced chronic liver disease (cACLD) may be lower, and patients may need additional testing with elasticity results of 7-9 kPa. Please note that abnormal hepatic elasticity and shear wave velocities may also be identified in clinical settings other than with hepatic fibrosis, such as: acute hepatitis, elevated right heart and central venous pressures including use of beta blockers, veno-occlusive disease (Budd-Chiari), infiltrative processes such as mastocytosis/amyloidosis/infiltrative tumor/lymphoma, extrahepatic cholestasis, with hyperemia in the post-prandial state, and with liver transplantation. Correlation with patient history, laboratory data, and clinical condition recommended. Diagnostic Categories: < or =5 kPa: high probability of being normal < or =9 kPa: in the absence of other known clinical signs, rules out cACLD >9 kPa and ?13 kPa: suggestive of cACLD, but needs further testing >13 kPa: highly suggestive of cACLD > or =17 kPa: highly suggestive of cACLD with an increased probability of clinically significant portal hypertension IMPRESSION: ULTRASOUND ABDOMEN: Cirrhotic appearing liver with nodular margins. Calcified granulomata within liver and spleen. Remainder  of exam unremarkable. ULTRASOUND HEPATIC ELASTOGRAPHY: Median kPa: 3.3 Diagnostic category:  < or =  5 kPa: high probability of being normal Electronically Signed   By: Lavonia Dana M.D.   On: 08/29/2020 09:45    Labs:  CBC: Recent Labs    08/07/20 0543 08/08/20 0908 08/23/20 1105 09/01/20 0708  WBC 7.6 6.7 5.7 5.6  HGB 11.3* 12.3* 13.1 12.8*  HCT 35.2* 38.4* 40.4 38.8*  PLT 352 384 164 196    COAGS: Recent Labs    08/07/20 0543  INR 1.0    BMP: Recent Labs    08/06/20 0417 08/07/20 0543 08/08/20 0502 08/22/20 0955  NA 139 141 142 145  K 3.6 3.5 3.5 3.8  CL 104 104 103 106  CO2 27 28 31  32  GLUCOSE 207* 152* 140* 115*  BUN 31* 31* 28* 13  CALCIUM 7.6* 7.5* 7.5* 7.6*  CREATININE 1.30* 1.22 1.21 1.03  GFRNONAA 59* >60 >60 79  GFRAA >60 >60 >60 91    LIVER FUNCTION TESTS: Recent Labs    10/26/19 1506 10/26/19 1506 12/23/19 0752 05/25/20 0849 08/04/20 0859 08/05/20 0809 08/06/20 0417 08/07/20 0543 08/08/20 0502 08/22/20 0955  BILITOT 0.4  --  0.5 0.4  --   --   --   --   --  0.5  AST 15  --  15 15  --   --   --   --   --  23  ALT 13   < > 15 11  --   --   --   --   --  32  30  ALKPHOS 111  --  113  --   --   --   --   --   --   --   PROT 5.4*  --  5.2* 4.5*  --   --   --   --   --  4.1*  ALBUMIN 3.0*   < > 2.8*  --    < > 1.1* 1.1* 1.1* 1.0*  --    < > = values in this interval not displayed.    Assessment and Plan: Patient familiar to IR service from random renal biopsy on 08/07/2020.  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 He presents today for CT-guided bone marrow biopsy to assess for plasma cell population/Congo red amyloid.  Risks and benefits of procedure was discussed with the patient  including, but not limited to bleeding, infection, damage to adjacent structures or low yield requiring additional tests.  All of the questions were answered and there is agreement to proceed.  Consent signed and in  chart.     Electronically Signed: D. Rowe Robert, PA-C 09/01/2020, 8:23 AM   I spent a total of 20 minutes at the the patient's bedside AND on the patient's hospital floor or unit, greater than 50% of which was counseling/coordinating care for CT-guided bone marrow biopsy

## 2020-09-01 NOTE — Telephone Encounter (Signed)
Pt states visit with cardiology provider yesterday discussed cancelling CCTA appt for Monday and ordering cardiac MRI instead.  I will cancel mondays CCTA appt and will reach back out to patient after MRI appt is scheduled to review MRI instructions  Pt thanked me for the call.  Marchia Bond RN Navigator Cardiac Imaging [facility] [phone] Office  [phone] Cell

## 2020-09-04 ENCOUNTER — Ambulatory Visit (HOSPITAL_COMMUNITY): Payer: BC Managed Care – PPO

## 2020-09-04 ENCOUNTER — Telehealth: Payer: Self-pay | Admitting: *Deleted

## 2020-09-04 NOTE — Telephone Encounter (Signed)
Leave message for pt to call back 

## 2020-09-04 NOTE — Telephone Encounter (Signed)
-----   Message from Erlene Quan, [place] sent at 09/04/2020  1:18 PM EDT ----- Please arrange for a virtual visit with Mr Rone with me Nov 11th to discuss timing of cardiac MRI.  Kerin Ransom PA-C 09/04/2020 1:19 PM

## 2020-09-12 ENCOUNTER — Encounter (HOSPITAL_COMMUNITY): Payer: Self-pay | Admitting: Hematology and Oncology

## 2020-09-12 ENCOUNTER — Ambulatory Visit: Payer: BC Managed Care – PPO | Admitting: Physician Assistant

## 2020-09-12 NOTE — Telephone Encounter (Signed)
Left message for patient to call back  

## 2020-09-13 ENCOUNTER — Other Ambulatory Visit: Payer: Self-pay | Admitting: *Deleted

## 2020-09-13 ENCOUNTER — Ambulatory Visit (HOSPITAL_COMMUNITY)
Admission: RE | Admit: 2020-09-13 | Discharge: 2020-09-13 | Disposition: A | Discharge status: Home / Self Care | Payer: BC Managed Care – PPO | Source: Ambulatory Visit | Attending: Cardiovascular Disease | Admitting: Cardiovascular Disease

## 2020-09-13 ENCOUNTER — Other Ambulatory Visit: Payer: Self-pay

## 2020-09-13 DIAGNOSIS — R1031 Right lower quadrant pain: Secondary | ICD-10-CM | POA: Insufficient documentation

## 2020-09-13 DIAGNOSIS — R1909 Other intra-abdominal and pelvic swelling, mass and lump: Secondary | ICD-10-CM | POA: Insufficient documentation

## 2020-09-13 DIAGNOSIS — E8581 Light chain (AL) amyloidosis: Secondary | ICD-10-CM

## 2020-09-14 ENCOUNTER — Other Ambulatory Visit: Payer: Self-pay

## 2020-09-14 ENCOUNTER — Inpatient Hospital Stay: Payer: BC Managed Care – PPO | Attending: Hematology and Oncology | Admitting: Hematology and Oncology

## 2020-09-14 ENCOUNTER — Inpatient Hospital Stay: Payer: BC Managed Care – PPO

## 2020-09-14 ENCOUNTER — Other Ambulatory Visit: Payer: Self-pay | Admitting: Hematology and Oncology

## 2020-09-14 ENCOUNTER — Encounter: Payer: Self-pay | Admitting: Hematology and Oncology

## 2020-09-14 VITALS — BP 129/67 | HR 98 | Temp 97.4°F | Resp 20 | Ht 71.0 in | Wt 231.6 lb

## 2020-09-14 DIAGNOSIS — Z87891 Personal history of nicotine dependence: Secondary | ICD-10-CM | POA: Insufficient documentation

## 2020-09-14 DIAGNOSIS — Z7984 Long term (current) use of oral hypoglycemic drugs: Secondary | ICD-10-CM | POA: Diagnosis not present

## 2020-09-14 DIAGNOSIS — K709 Alcoholic liver disease, unspecified: Secondary | ICD-10-CM | POA: Diagnosis not present

## 2020-09-14 DIAGNOSIS — L03311 Cellulitis of abdominal wall: Secondary | ICD-10-CM | POA: Insufficient documentation

## 2020-09-14 DIAGNOSIS — M25531 Pain in right wrist: Secondary | ICD-10-CM | POA: Insufficient documentation

## 2020-09-14 DIAGNOSIS — M25532 Pain in left wrist: Secondary | ICD-10-CM | POA: Diagnosis not present

## 2020-09-14 DIAGNOSIS — Z833 Family history of diabetes mellitus: Secondary | ICD-10-CM | POA: Insufficient documentation

## 2020-09-14 DIAGNOSIS — Z823 Family history of stroke: Secondary | ICD-10-CM | POA: Diagnosis not present

## 2020-09-14 DIAGNOSIS — Z8249 Family history of ischemic heart disease and other diseases of the circulatory system: Secondary | ICD-10-CM | POA: Insufficient documentation

## 2020-09-14 DIAGNOSIS — N049 Nephrotic syndrome with unspecified morphologic changes: Secondary | ICD-10-CM | POA: Diagnosis not present

## 2020-09-14 DIAGNOSIS — K589 Irritable bowel syndrome without diarrhea: Secondary | ICD-10-CM | POA: Diagnosis not present

## 2020-09-14 DIAGNOSIS — Z79899 Other long term (current) drug therapy: Secondary | ICD-10-CM | POA: Diagnosis not present

## 2020-09-14 DIAGNOSIS — E8581 Light chain (AL) amyloidosis: Secondary | ICD-10-CM | POA: Diagnosis present

## 2020-09-14 DIAGNOSIS — E119 Type 2 diabetes mellitus without complications: Secondary | ICD-10-CM | POA: Diagnosis not present

## 2020-09-14 DIAGNOSIS — I10 Essential (primary) hypertension: Secondary | ICD-10-CM | POA: Diagnosis not present

## 2020-09-14 DIAGNOSIS — Z5112 Encounter for antineoplastic immunotherapy: Secondary | ICD-10-CM | POA: Diagnosis not present

## 2020-09-14 DIAGNOSIS — B181 Chronic viral hepatitis B without delta-agent: Secondary | ICD-10-CM | POA: Diagnosis not present

## 2020-09-14 DIAGNOSIS — E859 Amyloidosis, unspecified: Secondary | ICD-10-CM | POA: Diagnosis not present

## 2020-09-14 DIAGNOSIS — Z5111 Encounter for antineoplastic chemotherapy: Secondary | ICD-10-CM | POA: Diagnosis present

## 2020-09-14 DIAGNOSIS — E785 Hyperlipidemia, unspecified: Secondary | ICD-10-CM | POA: Insufficient documentation

## 2020-09-14 LAB — CBC WITH DIFFERENTIAL (CANCER CENTER ONLY)
Abs Immature Granulocytes: 0.01 10*3/uL (ref 0.00–0.07)
Basophils Absolute: 0 10*3/uL (ref 0.0–0.1)
Basophils Relative: 1 %
Eosinophils Absolute: 0.1 10*3/uL (ref 0.0–0.5)
Eosinophils Relative: 2 %
HCT: 38.8 % — ABNORMAL LOW (ref 39.0–52.0)
Hemoglobin: 12.9 g/dL — ABNORMAL LOW (ref 13.0–17.0)
Immature Granulocytes: 0 %
Lymphocytes Relative: 39 %
Lymphs Abs: 2.3 10*3/uL (ref 0.7–4.0)
MCH: 30.1 pg (ref 26.0–34.0)
MCHC: 33.2 g/dL (ref 30.0–36.0)
MCV: 90.7 fL (ref 80.0–100.0)
Monocytes Absolute: 0.5 10*3/uL (ref 0.1–1.0)
Monocytes Relative: 8 %
Neutro Abs: 2.9 10*3/uL (ref 1.7–7.7)
Neutrophils Relative %: 50 %
Platelet Count: 295 10*3/uL (ref 150–400)
RBC: 4.28 MIL/uL (ref 4.22–5.81)
RDW: 15.4 % (ref 11.5–15.5)
WBC Count: 5.9 10*3/uL (ref 4.0–10.5)
nRBC: 0 % (ref 0.0–0.2)

## 2020-09-14 LAB — CMP (CANCER CENTER ONLY)
ALT: 10 U/L (ref 0–44)
AST: 18 U/L (ref 15–41)
Albumin: 1.4 g/dL — ABNORMAL LOW (ref 3.5–5.0)
Alkaline Phosphatase: 95 U/L (ref 38–126)
Anion gap: 7 (ref 5–15)
BUN: 14 mg/dL (ref 6–20)
CO2: 33 mmol/L — ABNORMAL HIGH (ref 22–32)
Calcium: 7.9 mg/dL — ABNORMAL LOW (ref 8.9–10.3)
Chloride: 103 mmol/L (ref 98–111)
Creatinine: 0.99 mg/dL (ref 0.61–1.24)
GFR, Estimated: >60 mL/min (ref >60)
Glucose, Bld: 109 mg/dL — ABNORMAL HIGH (ref 70–99)
Potassium: 2.9 mmol/L — ABNORMAL LOW (ref 3.5–5.1)
Sodium: 143 mmol/L (ref 135–145)
Total Bilirubin: 0.4 mg/dL (ref 0.3–1.2)
Total Protein: 4.7 g/dL — ABNORMAL LOW (ref 6.5–8.1)

## 2020-09-14 MED ORDER — POTASSIUM CHLORIDE CRYS ER 20 MEQ PO TBCR: 20.0000 meq | EXTENDED_RELEASE_TABLET | Freq: Two times a day (BID) | ORAL | 0 refills | Status: DC

## 2020-09-14 NOTE — Patient Instructions (Signed)
Today we discussed your diagnosis of AL amyloidosis. This is a blood cancer that causes buildup in your organs. The treatment for this is chemotherapy. We will start with four chemotherapy medications:  Dexamethasone: a steroid you take by mouth Bortezomib: a shot you receive in your abdomen Daratumumab:  a shot you receive in your abdomen Cyclophosphamide: an IV infusion into your vein.  You will receive this treatment once weekly at first. We will plan to start in 1-2 weeks time. Before that time you will meet with a chemotherapy education nurse to discuss this treatment in detail.  This chemotherapy will be safe with your Hepatitis B treatment and other medications.   If you have any questions or concerns about appointments, scheduling, or a new/concerning symptom please call us at [phone]. There is someone able to help at this number 24/7.

## 2020-09-14 NOTE — Progress Notes (Signed)
[place] Telephone:([phone]) [phone]   [phone]  PROGRESS NOTE  Patient Care Team: Isaac Bliss, Rayford Halsted, MD as PCP - General (Internal Medicine)  Hematological/Oncological History # AL Amyloidosis of the Kidney 1) 08/03/2020: Free kappa 19.5, Lambda 205.7, Kappa/Lambda ratio: 0.09. M protein undetectable 2) 08/07/2020: kidney biopsy performed, results consistent with lambda light chain AL amyloidosis 3) 08/23/2020: establish care with Dr. Lorenso Courier  4) 09/01/2020: bone marrow biopsy shows plasma cell neoplasm with focal amyloid deposits  Interval History:  Samuel Butler 59 y.o. male with medical history significant for AL amyloidosis of the kidney presents for a follow up visit. The patient's last visit was on 08/23/2020 at which time he establish care. In the interim since the last visit he has undergone a bone marrow biopsy which has confirmed a plasma cell population, confirming the diagnosis of AL amyloidosis.   Exam today Samuel Butler is accompanied by his wife.  He reports he has been healing up well from the bone marrow biopsy.  He reports having pain bilaterally in his wrists due to swelling.  Otherwise he notes that he has not had any changes in his urinary symptoms.  He denies having any issues with fevers, chills, sweats, nausea, vomiting or diarrhea.  The bulk of our discussion today focused on the results of the bone marrow biopsy and the treatment options moving forward.  MEDICAL HISTORY:  Past Medical History:  Diagnosis Date   DDD (degenerative disc disease), lumbar    per his report   Diabetes mellitus without complication ([place])    Gout 04/16/2013   Hyperlipidemia    Hypertension    IBD (inflammatory bowel disease) 04/05/2016   Tobacco use disorder 03/28/2015    SURGICAL HISTORY: Past Surgical History:  Procedure Laterality Date   ABDOMINAL AORTOGRAM W/LOWER EXTREMITY Right 06/26/2020   Procedure: ABDOMINAL AORTOGRAM W/LOWER  EXTREMITY;  Surgeon: Lorretta Harp, MD;  Location: [facility];  Service: Cardiovascular;  Laterality: Right;   COLONOSCOPY     PERIPHERAL VASCULAR INTERVENTION  06/26/2020   Procedure: PERIPHERAL VASCULAR INTERVENTION;  Surgeon: Lorretta Harp, MD;  Location: [facility];  Service: Cardiovascular;;  Right SFA   TONSILLECTOMY      SOCIAL HISTORY: Social History   Socioeconomic History   Marital status: Married    Spouse name: Not on file   Number of children: Not on file   Years of education: Not on file   Highest education level: Not on file  Occupational History   Not on file  Tobacco Use   Smoking status: Former Smoker    Types: Cigarettes    Quit date: 10/23/2016    Years since quitting: 3.8   Smokeless tobacco: Never Used   Tobacco comment: per patient 5 cigarettes a day   Vaping Use   Vaping Use: Never used  Substance and Sexual Activity   Alcohol use: Yes    Comment: occasional   Drug use: No   Sexual activity: Not on file  Other Topics Concern   Not on file  Social History Narrative   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: living with wife      Spiritual Beliefs: Christian      Lifestyle: no regular exercise, diet described as fair            Social Determinants of Health   Financial Resource Strain:    Difficulty of Paying Living Expenses: Not on file  Food Insecurity:    Worried About Charity fundraiser in the Last Year: Not on file   [employer] of Food in the Last Year: Not on file  Transportation Needs:    Lack of Transportation (Medical): Not on file   Lack of Transportation (Non-Medical): Not on file  Physical Activity:    Days of Exercise per Week: Not on file   Minutes of Exercise per Session: Not on file  Stress:    Feeling of Stress : Not on file  Social Connections:    Frequency of Communication with Friends and Family: Not on file   Frequency of Social Gatherings with  Friends and Family: Not on file   Attends Religious Services: Not on file   Active Member of Clubs or Organizations: Not on file   Attends Archivist Meetings: Not on file   Marital Status: Not on file  Intimate Partner Violence:    Fear of Current or Ex-Partner: Not on file   Emotionally Abused: Not on file   Physically Abused: Not on file   Sexually Abused: Not on file    FAMILY HISTORY: Family History  Problem Relation Age of Onset   Hypertension Father    Diabetes Father    Stroke Father     ALLERGIES:  is allergic to crestor [rosuvastatin calcium].  MEDICATIONS:  Current Outpatient Medications  Medication Sig Dispense Refill   acetaminophen (TYLENOL) 650 MG CR tablet Take 650 mg by mouth every 8 (eight) hours as needed for pain.     aspirin EC 81 MG EC tablet Take 1 tablet (81 mg total) by mouth daily. Swallow whole. 30 tablet 11   atorvastatin (LIPITOR) 80 MG tablet Take 1 tablet (80 mg total) by mouth daily. 90 tablet 2   clopidogrel (PLAVIX) 75 MG tablet Take 1 tablet (75 mg total) by mouth daily with breakfast. 90 tablet 3   ergocalciferol (VITAMIN D2) 1.25 MG (50000 UT) capsule Take 1 capsule (50,000 Units total) by mouth once a week. 12 capsule 0   isosorbide mononitrate (IMDUR) 30 MG 24 hr tablet Take 1 tablet (30 mg total) by mouth daily. 90 tablet 1   losartan (COZAAR) 50 MG tablet Take 1 tablet (50 mg total) by mouth daily. 90 tablet 3   metFORMIN (GLUCOPHAGE) 500 MG tablet Take 1 tablet (500 mg total) by mouth daily with breakfast. 90 tablet 1   pantoprazole (PROTONIX) 40 MG tablet Take 1 tablet (40 mg total) by mouth daily. 90 tablet 1   potassium chloride SA (KLOR-CON) 20 MEQ tablet Take 1 tablet (20 mEq total) by mouth 2 (two) times daily. 28 tablet 0   torsemide (DEMADEX) 20 MG tablet Take 20 mg by mouth 2 (two) times daily.     No current facility-administered medications for this visit.    REVIEW OF SYSTEMS:     Constitutional: ( - ) fevers, ( - )  chills , ( - ) night sweats Eyes: ( - ) blurriness of vision, ( - ) double vision, ( - ) watery eyes Ears, nose, mouth, throat, and face: ( - ) mucositis, ( - ) sore throat Respiratory: ( - ) cough, ( - ) dyspnea, ( - ) wheezes Cardiovascular: ( - ) palpitation, ( - ) chest discomfort, ( - ) lower extremity swelling Gastrointestinal:  ( - ) nausea, ( - ) heartburn, ( - ) change in bowel habits Skin: ( - ) abnormal skin rashes Lymphatics: ( - ) new lymphadenopathy, ( - )  easy bruising Neurological: ( - ) numbness, ( - ) tingling, ( - ) new weaknesses Behavioral/Psych: ( - ) mood change, ( - ) new changes  All other systems were reviewed with the patient and are negative.  PHYSICAL EXAMINATION: ECOG PERFORMANCE STATUS: 1 - Symptomatic but completely ambulatory  Vitals:   09/14/20 1500  BP: 129/67  Pulse: 98  Resp: 20  Temp: (!) 97.4 F (36.3 C)  SpO2: 96%   Filed Weights   09/14/20 1500  Weight: 231 lb 9.6 oz (105.1 kg)    GENERAL: well appearing middle aged [place] American male alert, no distress and comfortable SKIN: skin color, texture, turgor are normal, no rashes or significant lesions EYES: conjunctiva are pink and non-injected, sclera clear LUNGS: clear to auscultation and percussion with normal breathing effort HEART: regular rate & rhythm and no murmurs and no lower extremity edema Musculoskeletal: no cyanosis of digits and no clubbing  PSYCH: alert & oriented x 3, fluent speech NEURO: no focal motor/sensory deficits  LABORATORY DATA:  I have reviewed the data as listed CBC Latest Ref Rng & Units 09/14/2020 09-20-20 08/23/2020  WBC 4.0 - 10.5 K/uL 5.9 5.6 5.7  Hemoglobin 13.0 - 17.0 g/dL 12.9(L) 12.8(L) 13.1  Hematocrit 39 - 52 % 38.8(L) 38.8(L) 40.4  Platelets 150 - 400 K/uL 295 196 164    CMP Latest Ref Rng & Units 09/14/2020 08/22/2020 08/22/2020  Glucose 70 - 99 mg/dL 109(H) 115(H) -  BUN 6 - 20 mg/dL 14 13 -   Creatinine 0.61 - 1.24 mg/dL 0.99 1.03 -  Sodium 135 - 145 mmol/L 143 145 -  Potassium 3.5 - 5.1 mmol/L 2.9(L) 3.8 -  Chloride 98 - 111 mmol/L 103 106 -  CO2 22 - 32 mmol/L 33(H) 32 -  Calcium 8.9 - 10.3 mg/dL 7.9(L) 7.6(L) -  Total Protein 6.5 - 8.1 g/dL 4.7(L) 4.1(L) -  Total Bilirubin 0.3 - 1.2 mg/dL 0.4 0.5 -  Alkaline Phos 38 - 126 U/L 95 - -  AST 15 - 41 U/L 18 23 -  ALT 0 - 44 U/L 10 32 30    Lab Results  Component Value Date   MPROTEIN Not Observed 08/23/2020   Lab Results  Component Value Date   KPAFRELGTCHN 18.5 08/23/2020   KPAFRELGTCHN 18.0 08/04/2020   LAMBDASER 199.1 (H) 08/23/2020   LAMBDASER 192.5 (H) 08/04/2020   KAPLAMBRATIO 0.11 (L) 08/25/2020   KAPLAMBRATIO 0.09 (L) 08/23/2020   KAPLAMBRATIO 0.09 (L) 08/04/2020     RADIOGRAPHIC STUDIES: CT Biopsy  Result Date: September 20, 2020 INDICATION: 31-year-old male with clinical concern for renal amyloidosis. Bone marrow biopsy requested for further workup. EXAM: CT-GUIDED BONE MARROW BIOPSY AND ASPIRATION MEDICATIONS: None ANESTHESIA/SEDATION: Fentanyl 100 mcg IV; Versed 3 mg IV Sedation Time: 10 minutes; The patient was continuously monitored during the procedure by the interventional radiology nurse under my direct supervision. COMPLICATIONS: None immediate. PROCEDURE: Informed consent was obtained from the patient following an explanation of the procedure, risks, benefits and alternatives. The patient understands, agrees and consents for the procedure. All questions were addressed. A time out was performed prior to the initiation of the procedure. The patient was positioned prone and non-contrast localization CT was performed of the pelvis to demonstrate the iliac marrow spaces. The operative site was prepped and draped in the usual sterile fashion. Under sterile conditions and local anesthesia, a 22 gauge spinal needle was utilized for procedural planning. Next, an 11 gauge coaxial bone biopsy needle was advanced into the  right  iliac marrow space. Needle position was confirmed with CT imaging. Initially, a bone marrow aspiration was performed. Next, a bone marrow biopsy was obtained with the 11 gauge outer bone marrow device. The 11 gauge coaxial bone biopsy needle was re-advanced into a slightly different location within the left iliac marrow space, positioning was confirmed with CT imaging and an additional bone marrow biopsy was obtained. Samples were prepared with the cytotechnologist and deemed adequate. The needle was removed and superficial hemostasis was obtained with manual compression. A dressing was applied. The patient tolerated the procedure well without immediate post procedural complication. IMPRESSION: Successful CT guided right iliac bone marrow aspiration and core biopsy. Ruthann Cancer, MD [facility] Radiology Electronically Signed   By: Ruthann Cancer MD   On: 09/01/2020 10:42   CT BONE MARROW BIOPSY & ASPIRATION  Result Date: 09/01/2020 INDICATION: 41-year-old male with clinical concern for renal amyloidosis. Bone marrow biopsy requested for further workup. EXAM: CT-GUIDED BONE MARROW BIOPSY AND ASPIRATION MEDICATIONS: None ANESTHESIA/SEDATION: Fentanyl 100 mcg IV; Versed 3 mg IV Sedation Time: 10 minutes; The patient was continuously monitored during the procedure by the interventional radiology nurse under my direct supervision. COMPLICATIONS: None immediate. PROCEDURE: Informed consent was obtained from the patient following an explanation of the procedure, risks, benefits and alternatives. The patient understands, agrees and consents for the procedure. All questions were addressed. A time out was performed prior to the initiation of the procedure. The patient was positioned prone and non-contrast localization CT was performed of the pelvis to demonstrate the iliac marrow spaces. The operative site was prepped and draped in the usual sterile fashion. Under  sterile conditions and local anesthesia, a 22 gauge spinal needle was utilized for procedural planning. Next, an 11 gauge coaxial bone biopsy needle was advanced into the right iliac marrow space. Needle position was confirmed with CT imaging. Initially, a bone marrow aspiration was performed. Next, a bone marrow biopsy was obtained with the 11 gauge outer bone marrow device. The 11 gauge coaxial bone biopsy needle was re-advanced into a slightly different location within the left iliac marrow space, positioning was confirmed with CT imaging and an additional bone marrow biopsy was obtained. Samples were prepared with the cytotechnologist and deemed adequate. The needle was removed and superficial hemostasis was obtained with manual compression. A dressing was applied. The patient tolerated the procedure well without immediate post procedural complication. IMPRESSION: Successful CT guided right iliac bone marrow aspiration and core biopsy. Ruthann Cancer, MD [facility] Radiology Electronically Signed   By: Ruthann Cancer MD   On: 09/01/2020 10:42   US ABDOMEN COMPLETE W/ELASTOGRAPHY  Result Date: 08/29/2020 CLINICAL DATA:  Chronic hepatitis B, hepatocellular carcinoma screening EXAM: ULTRASOUND ABDOMEN ULTRASOUND HEPATIC ELASTOGRAPHY TECHNIQUE: Sonography of the upper abdomen was performed. In addition, ultrasound elastography evaluation of the liver was performed. A region of interest was placed within the right lobe of the liver. Following application of a compressive sonographic pulse, tissue compressibility was assessed. Multiple assessments were performed at the selected site. Median tissue compressibility was determined. Previously, hepatic stiffness was assessed by shear wave velocity. Based on recently published Society of Radiologists in Ultrasound consensus article, reporting is now recommended to be performed in the SI units of pressure (kiloPascals)  representing hepatic stiffness/elasticity. The obtained result is compared to the published reference standards. (cACLD = compensated Advanced Chronic Liver Disease) COMPARISON:  CT abdomen and pelvis 03/23/2009 FINDINGS: ULTRASOUND ABDOMEN Gallbladder: Normally distended without  stones or wall thickening. No pericholecystic fluid or sonographic Murphy sign. Common bile duct: Diameter: 4 mm, normal Liver: Slightly heterogeneous parenchymal echogenicity. Mildly nodular hepatic contour. Scattered calcified granulomata as noted on prior CT. No discrete hepatic mass visualized. No intrahepatic biliary dilatation. Portal vein is patent on color Doppler imaging with normal direction of blood flow towards the liver. IVC: Normal appearance Pancreas: Normal appearance Spleen: Suboptimally visualized due to to poor acoustic window and shadowing from lung base. Few calcified granulomata. No focal mass. Right Kidney: Length: 12.7 cm. Normal cortical thickness. Increased cortical echogenicity. Multiple cysts, largest 2.3 x 1.8 x 1.6 cm at upper pole, simple in character. No hydronephrosis. Left Kidney: Length: 13.1 cm. Normal cortical thickness. Increased cortical echogenicity. Multiple cysts, largest at upper pole 9.1 x 3.8 x 4.3 cm, complex and containing multiple septations, markedly increased since 2010. No hydronephrosis. Abdominal aorta: Normal caliber Other findings: No free fluid ULTRASOUND HEPATIC ELASTOGRAPHY Device: Siemens Helix VTQ Patient position: Not specified Transducer 5C1 Number of measurements: 10 Hepatic segment:  8 Median kPa: 3.3 IQR: 0.6 IQR/Median kPa ratio: 0.2 Data quality: Exam degraded by motion Diagnostic category:  < or = 5 kPa: high probability of being normal The use of hepatic elastography is applicable to patients with viral hepatitis and non-alcoholic fatty liver disease. At this time, there is insufficient data for the referenced cut-off values and use in other causes of liver disease,  including alcoholic liver disease. Patients, however, may be assessed by elastography and serve as their own reference standard/baseline. In patients with non-alcoholic liver disease, the values suggesting compensated advanced chronic liver disease (cACLD) may be lower, and patients may need additional testing with elasticity results of 7-9 kPa. Please note that abnormal hepatic elasticity and shear wave velocities may also be identified in clinical settings other than with hepatic fibrosis, such as: acute hepatitis, elevated right heart and central venous pressures including use of beta blockers, veno-occlusive disease (Budd-Chiari), infiltrative processes such as mastocytosis/amyloidosis/infiltrative tumor/lymphoma, extrahepatic cholestasis, with hyperemia in the post-prandial state, and with liver transplantation. Correlation with patient history, laboratory data, and clinical condition recommended. Diagnostic Categories: < or =5 kPa: high probability of being normal < or =9 kPa: in the absence of other known clinical signs, rules out cACLD >9 kPa and ?13 kPa: suggestive of cACLD, but needs further testing >13 kPa: highly suggestive of cACLD > or =17 kPa: highly suggestive of cACLD with an increased probability of clinically significant portal hypertension IMPRESSION: ULTRASOUND ABDOMEN: Cirrhotic appearing liver with nodular margins. Calcified granulomata within liver and spleen. Remainder of exam unremarkable. ULTRASOUND HEPATIC ELASTOGRAPHY: Median kPa: 3.3 Diagnostic category:  < or = 5 kPa: high probability of being normal Electronically Signed   By: Lavonia Dana M.D.   On: 08/29/2020 09:45   VAS [place] GROIN PSEUDOANEURYSM  Result Date: 09/13/2020  ARTERIAL PSEUDOANEURYSM  Exam: Right groin, rule out pseudoaneurysm. Indications: Patient complains of groin pain and like something was pulling/sliding in and out ever since his arterial procedure in August 2021. He says it has since "popped" and was thought to  be an inguinal hernia. Performing Technologist: Mariane Masters RVT  Examination Guidelines: A complete evaluation includes B-mode imaging, spectral Doppler, color Doppler, and power Doppler as needed of all accessible portions of each vessel. Bilateral testing is considered an integral part of a complete examination. Limited examinations for reoccurring indications may be performed as noted. +------------+----------+---------+------+------------------------------+  Right Duplex PSV (cm/s) Waveform  Plaque  Comment(s)            +------------+----------+---------+------+------------------------------+  CFA              66     triphasic Normal                                 +------------+----------+---------+------+------------------------------+  PFA              65     triphasic Normal                                 +------------+----------+---------+------+------------------------------+  Prox SFA         64     triphasic Normal minimal plaque/wall thickening  +------------+----------+---------+------+------------------------------+ Right Vein comments:The common femoral vein and proximal femoral vein demonstrate normal respirophasic flow, no evidence of thrombosis.  Summary: No evidence of pseudoaneurysm, AVF or DVT  Diagnosing physician: Jenkins Rouge MD Electronically signed by Jenkins Rouge MD on 09/13/2020 at 6:06:24 PM.   --------------------------------------------------------------------------------    Final     ASSESSMENT & PLAN Samuel Butler 59 y.o. male with medical history significant for AL amyloidosis of the kidney presents for a follow up visit.  After review the labs, the records, discussion with the patient the findings are most consistent with AL amyloidosis affecting the kidney.  As such I would recommend we start treatment with Dara CyBorD as soon as is feasible.  After reviewing the biopsy results his findings are most consistent with AL amyloidosis. As such the treatment of  choice would be to target his plasma cell population with a triplet or quadruplet therapy. Therapy of choice in this case would consist of daratumumab, Velcade, cyclophosphamide, and dexamethasone. Given the patient's good functional status we will start with full dose Dara-CyBorD. I previously discussed the side effects of this chemotherapy with the patient including neuropathy, elevated blood pressure, drop in blood counts, hypersensitivity reaction, chest tightness, increased infection risk, and fatigue. The patient and family voiced their understanding of these findings and are agreeable to moving forward with quadruple therapy with the goal of being a bridge to bone marrow transplant.   The regimen of choice will be daratumumab, bortezomib, cyclophosphamide and dexamethasone per the ANDROMEDA Study ( Blood. 2020 Jul 2;136(1):71-80). Treatment consists of: Cyclophosphamide 300 mg/m2 intravenously and bortezomib 1.3 mg/m2 subcutaneously were given on days 1, 8, 15, and 22 of each 28 day cycle for up to 6 cycles. Dexamethasone 40 mg (starting dose) was given orally or intravenously weekly for each cycle for up to 6 cycles. DARA [place] was administered in a single, premixed vial and given by manual [place] injection over the course of 3 to 5 minutes weekly in cycles 1 to 2, every 2 weeks in cycles 3 to 6, and every 4 weeks thereafter as monotherapy for a maximum of 2 years. We would only consider this continued dosing regimen if patient declined or was determined not to be a candidate for BMT.   #AL Amyloidosis Affecting the Kidney --bone marrow biopsy and kidney biopsy help confirm the diagnosis of AL amyloidosis. --proceeding with  Dara-CyBorD, plan to start on 09/29/2020.  -- plan for clinic visit the day prior for lab review and opportunity for questsion/concerns regarding treatment.  --will monitoring monthly SPEP, UPEP, and SFLC while on treatment. --will have patient return to clinic for weekly  treatment with q  2 week clinic visits initially.   #Supportive Care --chemotherapy education to be scheduled  --zofran 45m q8H PRN and compazine 167mPO q6H for nausea --acyclovir 40033mO BID for VCZ prophylaxis -- EMLA cream for port not required, patient declines port at this time -- no pain medication required at this time.    No orders of the defined types were placed in this encounter.   All questions were answered. The patient knows to call the clinic with any problems, questions or concerns.  A total of more than 40 minutes were spent on this encounter and over half of that time was spent on counseling and coordination of care as outlined above.   JohLedell PeoplesD Department of Hematology/Oncology [facility]: [phone]: [phone]: [email or web address]  09/14/2020 6:47 PM   Literature Support:  V, Wendie Chess, Weiss BM, VerRush Landmarkerlini G, Comenzo RL. Daratumumab plus CyBorD for patients with newly diagnosed AL amyloidosis: safety run-in results of ANDROMEDA. Blood. 2020 Jul 2;136(1):71-80.  --Daratumumab-CyBorD was well tolerated, with no new safety concerns versus the intravenous formulation, and demonstrated robust hematologic and organ responses.

## 2020-09-14 NOTE — Progress Notes (Signed)
START ON PATHWAY REGIMEN - Multiple Myeloma and Other Plasma Cell Dyscrasias     Cycles 1 and 2: A cycle is every 28 days:     Cyclophosphamide      Dexamethasone      Bortezomib      Daratumumab and hyaluronidase-fihj    Cycles 3 through 6: A cycle is every 28 days:     Cyclophosphamide      Dexamethasone      Bortezomib      Daratumumab and hyaluronidase-fihj    Cycles 7 through 24: A cycle is every 28 days:     Daratumumab and hyaluronidase-fihj   **Always confirm dose/schedule in your pharmacy ordering system**  Patient Characteristics: Primary AL Amyloidosis, First Line, Eligible for Transplant Disease Classification: Primary AL Amyloidosis Line of therapy: First Line Transplant Eligibility: Eligible for Transplant Intent of Therapy: Non-Curative / Palliative Intent, Discussed with Patient

## 2020-09-15 ENCOUNTER — Telehealth: Payer: Self-pay | Admitting: Internal Medicine

## 2020-09-15 ENCOUNTER — Encounter (HOSPITAL_COMMUNITY): Payer: Self-pay | Admitting: Hematology and Oncology

## 2020-09-15 NOTE — Telephone Encounter (Signed)
Have not yet received forms and wife is aware.

## 2020-09-15 NOTE — Telephone Encounter (Signed)
Patient called to make sure his disability paperwork has been received by Apolonio Schneiders. Please call patient to verify.

## 2020-09-17 LAB — SURGICAL PATHOLOGY

## 2020-09-19 ENCOUNTER — Inpatient Hospital Stay: Payer: BC Managed Care – PPO

## 2020-09-19 ENCOUNTER — Other Ambulatory Visit: Payer: Self-pay

## 2020-09-22 ENCOUNTER — Encounter: Payer: Self-pay | Admitting: Internal Medicine

## 2020-09-22 DIAGNOSIS — K4090 Unilateral inguinal hernia, without obstruction or gangrene, not specified as recurrent: Secondary | ICD-10-CM

## 2020-09-24 ENCOUNTER — Encounter: Payer: Self-pay | Admitting: Hematology and Oncology

## 2020-09-26 ENCOUNTER — Other Ambulatory Visit: Payer: Self-pay

## 2020-09-26 ENCOUNTER — Telehealth: Payer: Self-pay | Admitting: Internal Medicine

## 2020-09-26 ENCOUNTER — Ambulatory Visit: Payer: BC Managed Care – PPO | Admitting: Cardiovascular Disease

## 2020-09-26 ENCOUNTER — Encounter: Payer: Self-pay | Admitting: Cardiovascular Disease

## 2020-09-26 DIAGNOSIS — R079 Chest pain, unspecified: Secondary | ICD-10-CM

## 2020-09-26 DIAGNOSIS — I739 Peripheral vascular disease, unspecified: Secondary | ICD-10-CM

## 2020-09-26 LAB — BASIC METABOLIC PANEL
BUN/Creatinine Ratio: 13 (ref 10–24)
BUN: 15 mg/dL (ref 8–27)
CO2: 31 mmol/L — ABNORMAL HIGH (ref 20–29)
Calcium: 8.4 mg/dL — ABNORMAL LOW (ref 8.6–10.2)
Chloride: 103 mmol/L (ref 96–106)
Creatinine, Ser: 1.12 mg/dL (ref 0.76–1.27)
GFR calc Af Amer: 82 mL/min/{1.73_m2} (ref >59)
GFR calc non Af Amer: 71 mL/min/{1.73_m2} (ref >59)
Glucose: 87 mg/dL (ref 65–99)
Potassium: 3.8 mmol/L (ref 3.5–5.2)
Sodium: 145 mmol/L — ABNORMAL HIGH (ref 134–144)

## 2020-09-26 MED ORDER — METOPROLOL TARTRATE 50 MG PO TABS: ORAL_TABLET | ORAL | 0 refills | Status: DC

## 2020-09-26 NOTE — Progress Notes (Signed)
09/26/2020 Samuel Butler   1961/01/02  [id number]  Primary Physician Isaac Bliss, Rayford Halsted, MD Primary Cardiologist: Lorretta Harp MD [name], [place]  HPI:  Samuel Butler is a 59 y.o.  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 He was referred by Dr. Debara Pickett for peripheral vascular valuation because of lifestyle limiting claudication.  I last saw him in the office 06/14/2020.History factors include just continued tobacco use for years ago when he was smoking 2 packs a week for the last 40 years, treated hypertension and hyperlipidemia. Is not diabetic. His mother did die of a myocardial infarction in her 88s. He was referred to Dr. Debara Pickett because of peripheral edema and hyper lipidemia. A work-up is in progress. He was prescribed a statin which she could not tolerate. He has had lifestyle limiting claudication for last 6 months. Dopplers performed 02/28/2020 revealed a right ABI of 0.76 and a left of 0.87 with a high-frequency signal in his mid right SFA and left tibial vessel disease.  Because of his progressive claudication I performed peripheral angiography on him 06/26/2020 revealing high-grade proximal and mid right SFA disease as well as high-grade calcified right popliteal disease with 0 vessel runoff.  He had a narrow aortic bifurcation and therefore I performed right common femoral antegrade puncture and stenting of both proximal mid right SFA stenoses resulting in markedly improved claudication and ABIs.  He was complaining some right groin pain.  Doppler ruled out AV fistula or pseudoaneurysm and he was ultimately found to have a right inguinal hernia.  He has also been diagnosed with amyloid disease with nephrotic syndrome probably contributing to his lower extremity edema.  He also complains of occasional chest pain several times a week and a coronary CTA has been ordered.   Current Meds    Medication Sig  . acetaminophen (TYLENOL) 650 MG CR tablet Take 650 mg by mouth every 8 (eight) hours as needed for pain.  Marland Kitchen aspirin EC 81 MG EC tablet Take 1 tablet (81 mg total) by mouth daily. Swallow whole.  Marland Kitchen atorvastatin (LIPITOR) 80 MG tablet Take 1 tablet (80 mg total) by mouth daily.  . clopidogrel (PLAVIX) 75 MG tablet Take 1 tablet (75 mg total) by mouth daily with breakfast.  . ergocalciferol (VITAMIN D2) 1.25 MG (50000 UT) capsule Take 1 capsule (50,000 Units total) by mouth once a week.  . isosorbide mononitrate (IMDUR) 30 MG 24 hr tablet Take 1 tablet (30 mg total) by mouth daily.  Marland Kitchen losartan (COZAAR) 50 MG tablet Take 1 tablet (50 mg total) by mouth daily.  . metFORMIN (GLUCOPHAGE) 500 MG tablet Take 1 tablet (500 mg total) by mouth daily with breakfast.  . pantoprazole (PROTONIX) 40 MG tablet Take 1 tablet (40 mg total) by mouth daily.  . potassium chloride SA (KLOR-CON) 20 MEQ tablet Take 1 tablet (20 mEq total) by mouth 2 (two) times daily.  Marland Kitchen torsemide (DEMADEX) 20 MG tablet Take 20 mg by mouth 2 (two) times daily.  . VEMLIDY 25 MG TABS Take 1 tablet by mouth daily.     Allergies  Allergen Reactions  . Crestor [Rosuvastatin Calcium] Other (See Comments)    Joint aches    Social History   Socioeconomic History  . Marital status: Married    Spouse name: Not on file  . Number of children: Not on file  . Years of education: Not on file  .  Highest education level: Not on file  Occupational History  . Not on file  Tobacco Use  . Smoking status: Former Smoker    Types: Cigarettes    Quit date: 10/23/2016    Years since quitting: 3.9  . Smokeless tobacco: Never Used  . Tobacco comment: per patient 5 cigarettes a day   Vaping Use  . Vaping Use: Never used  Substance and Sexual Activity  . Alcohol use: Yes    Comment: occasional  . Drug use: No  . Sexual activity: Not on file  Other Topics Concern  . Not on file  Social History Narrative   Work or School:  associate in [place]: living with wife      Spiritual Beliefs: Christian      Lifestyle: no regular exercise, diet described as fair            Social Determinants of Health   Financial Resource Strain:   . Difficulty of Paying Living Expenses: Not on file  Food Insecurity:   . Worried About Charity fundraiser in the Last Year: Not on file  . Ran Out of Food in the Last Year: Not on file  Transportation Needs:   . Lack of Transportation (Medical): Not on file  . Lack of Transportation (Non-Medical): Not on file  Physical Activity:   . Days of Exercise per Week: Not on file  . Minutes of Exercise per Session: Not on file  Stress:   . Feeling of Stress : Not on file  Social Connections:   . Frequency of Communication with Friends and Family: Not on file  . Frequency of Social Gatherings with Friends and Family: Not on file  . Attends Religious Services: Not on file  . Active Member of Clubs or Organizations: Not on file  . Attends Archivist Meetings: Not on file  . Marital Status: Not on file  Intimate Partner Violence:   . Fear of Current or Ex-Partner: Not on file  . Emotionally Abused: Not on file  . Physically Abused: Not on file  . Sexually Abused: Not on file     Review of Systems: General: negative for chills, fever, night sweats or weight changes.  Cardiovascular: negative for chest pain, dyspnea on exertion, edema, orthopnea, palpitations, paroxysmal nocturnal dyspnea or shortness of breath Dermatological: negative for rash Respiratory: negative for cough or wheezing Urologic: negative for hematuria Abdominal: negative for nausea, vomiting, diarrhea, bright red blood per rectum, melena, or hematemesis Neurologic: negative for visual changes, syncope, or dizziness All other systems reviewed and are otherwise negative except as noted above.    Blood pressure (!) 141/99, pulse 97, height 5\' 11"  (1.803 m), weight 234 lb 6.4 oz  (106.3 kg), SpO2 99 %.  General appearance: alert and no distress Neck: no adenopathy, no carotid bruit, no JVD, supple, symmetrical, trachea midline and thyroid not enlarged, symmetric, no tenderness/mass/nodules Lungs: clear to auscultation bilaterally Heart: regular rate and rhythm, S1, S2 normal, no murmur, click, rub or gallop Extremities: extremities normal, atraumatic, no cyanosis or edema Pulses: Diminished pedal pulses bilaterally Skin: Skin color, texture, turgor normal. No rashes or lesions Neurologic: Alert and oriented X 3, normal strength and tone. Normal symmetric reflexes. Normal coordination and gait  EKG not performed today  ASSESSMENT AND PLAN:   Claudication in peripheral vascular disease [facility]) Mr. Suman was referred to me by Dr. Debara Pickett for evaluation of symptomatic PAD.  I last saw him in  the office 06/14/2020.  He had Dopplers performed 02/28/2020 that revealed a right ABI of 0.76 and a left of 0.87 high-frequency signal in the mid right SFA as well as left tibial vessel disease.  I performed angiography on him 06/26/2020 revealing high-grade proximal and mid right SFA disease, high-grade calcified right above-the-knee popliteal disease with 0 vessel runoff.  He had a very narrow aortic bifurcation and therefore I performed revascularization using a right common femoral antegrade approach.  He did have 90% mid left SFA, 95 to 99% left popliteal disease with one-vessel runoff via the peroneal.  Symptoms of claudication markedly improved with a right ABI that rose to 0.93.  He does not have left leg claudication.  He was having right groin pain and I was concerned that he might have a pseudoaneurysm.  Ultrasound ruled this out but he was later found to have an inguinal hernia.      Lorretta Harp MD FACP,FACC,FAHA, [facility] 09/26/2020 10:16 AM

## 2020-09-26 NOTE — Assessment & Plan Note (Signed)
Samuel Butler was referred to me by Dr. Debara Pickett for evaluation of symptomatic PAD.  I last saw him in the office 06/14/2020.  He had Dopplers performed 02/28/2020 that revealed a right ABI of 0.76 and a left of 0.87 high-frequency signal in the mid right SFA as well as left tibial vessel disease.  I performed angiography on him 06/26/2020 revealing high-grade proximal and mid right SFA disease, high-grade calcified right above-the-knee popliteal disease with 0 vessel runoff.  He had a very narrow aortic bifurcation and therefore I performed revascularization using a right common femoral antegrade approach.  He did have 90% mid left SFA, 95 to 99% left popliteal disease with one-vessel runoff via the peroneal.  Symptoms of claudication markedly improved with a right ABI that rose to 0.93.  He does not have left leg claudication.  He was having right groin pain and I was concerned that he might have a pseudoaneurysm.  Ultrasound ruled this out but he was later found to have an inguinal hernia.

## 2020-09-26 NOTE — Telephone Encounter (Signed)
Renee Mccarron dropped off FMLA forms  Fax to: [phone]  Disposition: Dr's [employer]

## 2020-09-26 NOTE — Patient Instructions (Signed)
Medication Instructions:  Your physician recommends that you continue on your current medications as directed. Please refer to the Current Medication list given to you today.  *If you need a refill on your cardiac medications before your next appointment, please call your pharmacy*   Lab Work: Your physician recommends that you have labs today: BMET  If you have labs (blood work) drawn today and your tests are completely normal, you will receive your results only by: Marland Kitchen MyChart Message (if you have MyChart) OR . A paper copy in the mail If you have any lab test that is abnormal or we need to change your treatment, we will call you to review the results.   Testing/Procedures: Your cardiac CT will be scheduled at one of the below locations:   [facility] [address] [place], [place] [address] [phone]  If scheduled at [facility], please arrive at the [facility] main entrance of [facility] 30 minutes prior to test start time. Proceed to the [facility] Radiology Department (first floor) to check-in and test prep.  Please follow these instructions carefully (unless otherwise directed):  Hold all erectile dysfunction medications at least 3 days (72 hrs) prior to test.  On the Night Before the Test: . Be sure to Drink plenty of water. . Do not consume any caffeinated/decaffeinated beverages or chocolate 12 hours prior to your test. . Do not take any antihistamines 12 hours prior to your test.  On the Day of the Test: . Drink plenty of water. Do not drink any water within one hour of the test. . Do not eat any food 4 hours prior to the test. . You may take your regular medications prior to the test.  . Take metoprolol (Lopressor) 121m two hours prior to test. . HOLD Furosemide/Hydrochlorothiazide morning of the test.   *     After the Test: . Drink plenty of water. . After receiving IV contrast, you may experience a mild flushed feeling.  This is normal. . On occasion, you may experience a mild rash up to 24 hours after the test. This is not dangerous. If this occurs, you can take Benadryl 25 mg and increase your fluid intake. . If you experience trouble breathing, this can be serious. If it is severe call 911 IMMEDIATELY. If it is mild, please call our office. . If you take any of these medications: Glipizide/Metformin, Avandament, Glucavance, please do not take 48 hours after completing test unless otherwise instructed.   Once we have confirmed authorization from your insurance company, we will call you to set up a date and time for your test. Based on how quickly your insurance processes prior authorizations requests, please allow up to 4 weeks to be contacted for scheduling your Cardiac CT appointment. Be advised that routine Cardiac CT appointments could be scheduled as many as 8 weeks after your provider has ordered it.  For non-scheduling related questions, please contact the cardiac imaging nurse navigator should you have any questions/concerns: SMarchia Bond Cardiac Imaging Nurse Navigator MBurley Saver Interim Cardiac Imaging Nurse [facility] Direct Office Dial: [phone]  For scheduling needs, including cancellations and rescheduling, please call [place] [phone](temporary number).    Your physician has requested that you have a lower arterial duplex. This test is an ultrasound of the arteries in the legs. It looks at arterial blood flow in the legs. Allow one hour for Lower scans. There are no restrictions  or special instructions.  *To be done in 6 months   Follow-Up: At [facility], you and your health needs are our priority.  As part of our continuing mission to provide you with exceptional heart care, we have created designated Provider Care Teams.  These Care Teams include your primary Cardiologist (physician) and Advanced Practice Providers (APPs -  Physician  Assistants and Nurse Practitioners) who all work together to provide you with the care you need, when you need it.  We recommend signing up for the patient portal called "MyChart".  Sign up information is provided on this After Visit Summary.  MyChart is used to connect with patients for Virtual Visits (Telemedicine).  Patients are able to view lab/test results, encounter notes, upcoming appointments, etc.  Non-urgent messages can be sent to your provider as well.   To learn more about what you can do with MyChart, go to [email or web address].    Your next appointment:   6 month(s)  The format for your next appointment:   In Person  Provider:   Quay Burow, MD   Follow up with Dr. Debara Pickett after coronary CTA.

## 2020-09-28 ENCOUNTER — Other Ambulatory Visit: Payer: Self-pay | Admitting: Hematology and Oncology

## 2020-09-28 ENCOUNTER — Telehealth: Payer: Self-pay | Admitting: *Deleted

## 2020-09-28 ENCOUNTER — Inpatient Hospital Stay: Payer: BC Managed Care – PPO

## 2020-09-28 ENCOUNTER — Inpatient Hospital Stay (HOSPITAL_BASED_OUTPATIENT_CLINIC_OR_DEPARTMENT_OTHER): Payer: BC Managed Care – PPO | Admitting: Hematology and Oncology

## 2020-09-28 ENCOUNTER — Other Ambulatory Visit: Payer: Self-pay

## 2020-09-28 VITALS — BP 121/94 | HR 76 | Temp 97.4°F | Resp 18 | Ht 71.0 in | Wt 237.1 lb

## 2020-09-28 DIAGNOSIS — B181 Chronic viral hepatitis B without delta-agent: Secondary | ICD-10-CM | POA: Diagnosis not present

## 2020-09-28 DIAGNOSIS — E8581 Light chain (AL) amyloidosis: Secondary | ICD-10-CM

## 2020-09-28 DIAGNOSIS — Z5112 Encounter for antineoplastic immunotherapy: Secondary | ICD-10-CM | POA: Diagnosis not present

## 2020-09-28 LAB — CBC WITH DIFFERENTIAL (CANCER CENTER ONLY)
Abs Immature Granulocytes: 0.01 10*3/uL (ref 0.00–0.07)
Basophils Absolute: 0 10*3/uL (ref 0.0–0.1)
Basophils Relative: 1 %
Eosinophils Absolute: 0.2 10*3/uL (ref 0.0–0.5)
Eosinophils Relative: 4 %
HCT: 38.6 % — ABNORMAL LOW (ref 39.0–52.0)
Hemoglobin: 12.7 g/dL — ABNORMAL LOW (ref 13.0–17.0)
Immature Granulocytes: 0 %
Lymphocytes Relative: 36 %
Lymphs Abs: 2.2 10*3/uL (ref 0.7–4.0)
MCH: 30.2 pg (ref 26.0–34.0)
MCHC: 32.9 g/dL (ref 30.0–36.0)
MCV: 91.7 fL (ref 80.0–100.0)
Monocytes Absolute: 0.4 10*3/uL (ref 0.1–1.0)
Monocytes Relative: 7 %
Neutro Abs: 3.2 10*3/uL (ref 1.7–7.7)
Neutrophils Relative %: 52 %
Platelet Count: 180 10*3/uL (ref 150–400)
RBC: 4.21 MIL/uL — ABNORMAL LOW (ref 4.22–5.81)
RDW: 14.9 % (ref 11.5–15.5)
WBC Count: 6.1 10*3/uL (ref 4.0–10.5)
nRBC: 0 % (ref 0.0–0.2)

## 2020-09-28 LAB — CMP (CANCER CENTER ONLY)
ALT: 9 U/L (ref 0–44)
AST: 17 U/L (ref 15–41)
Albumin: 1.4 g/dL — ABNORMAL LOW (ref 3.5–5.0)
Alkaline Phosphatase: 88 U/L (ref 38–126)
Anion gap: 5 (ref 5–15)
BUN: 15 mg/dL (ref 6–20)
CO2: 33 mmol/L — ABNORMAL HIGH (ref 22–32)
Calcium: 7.9 mg/dL — ABNORMAL LOW (ref 8.9–10.3)
Chloride: 105 mmol/L (ref 98–111)
Creatinine: 1.14 mg/dL (ref 0.61–1.24)
GFR, Estimated: >60 mL/min (ref >60)
Glucose, Bld: 95 mg/dL (ref 70–99)
Potassium: 3.7 mmol/L (ref 3.5–5.1)
Sodium: 143 mmol/L (ref 135–145)
Total Bilirubin: 0.6 mg/dL (ref 0.3–1.2)
Total Protein: 4.5 g/dL — ABNORMAL LOW (ref 6.5–8.1)

## 2020-09-28 LAB — LACTATE DEHYDROGENASE: LDH: 257 U/L — ABNORMAL HIGH (ref 98–192)

## 2020-09-28 MED ORDER — ONDANSETRON HCL 8 MG PO TABS: 8.0000 mg | ORAL_TABLET | Freq: Three times a day (TID) | ORAL | 0 refills | Status: DC | PRN

## 2020-09-28 MED ORDER — ALBUTEROL SULFATE HFA 108 (90 BASE) MCG/ACT IN AERS: 2.0000 | INHALATION_SPRAY | Freq: Four times a day (QID) | RESPIRATORY_TRACT | 0 refills | Status: AC | PRN

## 2020-09-28 MED ORDER — ACYCLOVIR 400 MG PO TABS: 400.0000 mg | ORAL_TABLET | Freq: Two times a day (BID) | ORAL | 3 refills | Status: DC

## 2020-09-28 NOTE — Telephone Encounter (Signed)
Patient would like the start date to be 09/14/20 and the end date 12 weeks after.

## 2020-09-28 NOTE — Telephone Encounter (Signed)
TCT patient to advise of added lab appt for tomorrow prior to his first treatment. Spoke with his wife and she voiced understanding and assured me that he would be here around 12 noon tomorrow, 09/29/20

## 2020-09-28 NOTE — Progress Notes (Signed)
[place] [phone]   [phone]  PROGRESS NOTE  Patient Care Team: Isaac Bliss, Rayford Halsted, MD as PCP - General (Internal Medicine)  Hematological/Oncological History # AL Amyloidosis of the Kidney 1) 08/03/2020: Free kappa 19.5, Lambda 205.7, Kappa/Lambda ratio: 0.09. M protein undetectable 2) 08/07/2020: kidney biopsy performed, results consistent with lambda light chain AL amyloidosis 3) 08/23/2020: establish care with Dr. Lorenso Courier  4) 09/01/2020: bone marrow biopsy shows plasma cell neoplasm with focal amyloid deposits 5) 09/29/2020: anticipated Cycle 1 Day 1 of Dara-CyBorD  Interval History:  Samuel Butler 59 y.o. male with medical history significant for AL amyloidosis of the kidney presents for a follow up visit. The patient's last visit was on 09/15/2020 at which time we discussed the diagnosis of AL amyloidosis and steps moving forward. In the interim since his last visit he has undergone chemotherapy education and is ready to start therapy tomorrow.  On exam today Samuel Butler is accompanied by his wife.  Ports he has developed a light rash on his right forearm which has not been itchy or painful in nature.  He has occasionally had issues with chest pain which she has discussed with his cardiologist.  He reports that his appetite has not always been good, but that his wife does try to make him eat.  He reports that sometimes his urine is dark and occasionally he notes some red stains in his underwear.  He does not wish to have a port placed at this time but would like to consider it after his first few treatments.  He currently denies any issues with fevers, chills, sweats, nausea, vomiting or diarrhea.  A full 10 point ROS is listed below.Marland Kitchen  MEDICAL HISTORY:  Past Medical History:  Diagnosis Date  . DDD (degenerative disc disease), lumbar    per his report  . Diabetes mellitus without complication ([place])   . Gout 04/16/2013  . Hyperlipidemia     . Hypertension   . IBD (inflammatory bowel disease) 04/05/2016  . Tobacco use disorder 03/28/2015    SURGICAL HISTORY: Past Surgical History:  Procedure Laterality Date  . ABDOMINAL AORTOGRAM W/LOWER EXTREMITY Right 06/26/2020   Procedure: ABDOMINAL AORTOGRAM W/LOWER EXTREMITY;  Surgeon: Lorretta Harp, MD;  Location: [facility];  Service: Cardiovascular;  Laterality: Right;  . COLONOSCOPY    . PERIPHERAL VASCULAR INTERVENTION  06/26/2020   Procedure: PERIPHERAL VASCULAR INTERVENTION;  Surgeon: Lorretta Harp, MD;  Location: [facility];  Service: Cardiovascular;;  Right SFA  . TONSILLECTOMY      SOCIAL HISTORY: Social History   Socioeconomic History  . Marital status: Married    Spouse name: Not on file  . Number of children: Not on file  . Years of education: Not on file  . Highest education level: Not on file  Occupational History  . Not on file  Tobacco Use  . Smoking status: Former Smoker    Types: Cigarettes    Quit date: 10/23/2016    Years since quitting: 3.9  . Smokeless tobacco: Never Used  . Tobacco comment: per patient 5 cigarettes a day   Vaping Use  . Vaping Use: Never used  Substance and Sexual Activity  . Alcohol use: Yes    Comment: occasional  . Drug use: No  . Sexual activity: Not on file  Other Topics Concern  . Not on file  Social History Narrative   Work or School: associate in Proofreader  Home Situation: living with wife      Spiritual Beliefs: Christian      Lifestyle: no regular exercise, diet described as fair            Social Determinants of Health   Financial Resource Strain:   . Difficulty of Paying Living Expenses: Not on file  Food Insecurity:   . Worried About Charity fundraiser in the Last Year: Not on file  . Ran Out of Food in the Last Year: Not on file  Transportation Needs:   . Lack of Transportation (Medical): Not on file  . Lack of Transportation (Non-Medical): Not on file  Physical  Activity:   . Days of Exercise per Week: Not on file  . Minutes of Exercise per Session: Not on file  Stress:   . Feeling of Stress : Not on file  Social Connections:   . Frequency of Communication with Friends and Family: Not on file  . Frequency of Social Gatherings with Friends and Family: Not on file  . Attends Religious Services: Not on file  . Active Member of Clubs or Organizations: Not on file  . Attends Archivist Meetings: Not on file  . Marital Status: Not on file  Intimate Partner Violence:   . Fear of Current or Ex-Partner: Not on file  . Emotionally Abused: Not on file  . Physically Abused: Not on file  . Sexually Abused: Not on file    FAMILY HISTORY: Family History  Problem Relation Age of Onset  . Hypertension Father   . Diabetes Father   . Stroke Father     ALLERGIES:  is allergic to crestor [rosuvastatin calcium].  MEDICATIONS:  Current Outpatient Medications  Medication Sig Dispense Refill  . acetaminophen (TYLENOL) 650 MG CR tablet Take 650 mg by mouth every 8 (eight) hours as needed for pain.    Marland Kitchen acyclovir (ZOVIRAX) 400 MG tablet Take 1 tablet (400 mg total) by mouth 2 (two) times daily. 60 tablet 3  . albuterol (VENTOLIN HFA) 108 (90 Base) MCG/ACT inhaler Inhale 2 puffs into the lungs every 6 (six) hours as needed for wheezing or shortness of breath. 8 g 0  . aspirin EC 81 MG EC tablet Take 1 tablet (81 mg total) by mouth daily. Swallow whole. 30 tablet 11  . atorvastatin (LIPITOR) 80 MG tablet Take 1 tablet (80 mg total) by mouth daily. 90 tablet 2  . clopidogrel (PLAVIX) 75 MG tablet Take 1 tablet (75 mg total) by mouth daily with breakfast. 90 tablet 3  . ergocalciferol (VITAMIN D2) 1.25 MG (50000 UT) capsule Take 1 capsule (50,000 Units total) by mouth once a week. 12 capsule 0  . isosorbide mononitrate (IMDUR) 30 MG 24 hr tablet Take 1 tablet (30 mg total) by mouth daily. 90 tablet 1  . losartan (COZAAR) 50 MG tablet Take 1 tablet (50 mg  total) by mouth daily. 90 tablet 3  . metFORMIN (GLUCOPHAGE) 500 MG tablet Take 1 tablet (500 mg total) by mouth daily with breakfast. 90 tablet 1  . metoprolol tartrate (LOPRESSOR) 50 MG tablet Take 2 hours prior to procedure. 2 tablet 0  . ondansetron (ZOFRAN) 8 MG tablet Take 1 tablet (8 mg total) by mouth every 8 (eight) hours as needed for nausea or vomiting. 30 tablet 0  . pantoprazole (PROTONIX) 40 MG tablet Take 1 tablet (40 mg total) by mouth daily. 90 tablet 1  . potassium chloride SA (KLOR-CON) 20 MEQ tablet Take 1 tablet (  20 mEq total) by mouth 2 (two) times daily. 28 tablet 0  . torsemide (DEMADEX) 20 MG tablet Take 20 mg by mouth 2 (two) times daily.    . VEMLIDY 25 MG TABS Take 1 tablet by mouth daily.     No current facility-administered medications for this visit.    REVIEW OF SYSTEMS:   Constitutional: ( - ) fevers, ( - )  chills , ( - ) night sweats Eyes: ( - ) blurriness of vision, ( - ) double vision, ( - ) watery eyes Ears, nose, mouth, throat, and face: ( - ) mucositis, ( - ) sore throat Respiratory: ( - ) cough, ( - ) dyspnea, ( - ) wheezes Cardiovascular: ( - ) palpitation, ( - ) chest discomfort, ( - ) lower extremity swelling Gastrointestinal:  ( - ) nausea, ( - ) heartburn, ( - ) change in bowel habits Skin: ( - ) abnormal skin rashes Lymphatics: ( - ) new lymphadenopathy, ( - ) easy bruising Neurological: ( - ) numbness, ( - ) tingling, ( - ) new weaknesses Behavioral/Psych: ( - ) mood change, ( - ) new changes  All other systems were reviewed with the patient and are negative.  PHYSICAL EXAMINATION: ECOG PERFORMANCE STATUS: 1 - Symptomatic but completely ambulatory  Vitals:   09/28/20 1442  BP: (!) 121/94  Pulse: 76  Resp: 18  Temp: (!) 97.4 F (36.3 C)  SpO2: 98%   Filed Weights   09/28/20 1442  Weight: 237 lb 1.6 oz (107.5 kg)    GENERAL: well appearing middle aged [place] American male alert, no distress and comfortable SKIN: skin color,  texture, turgor are normal, no rashes or significant lesions EYES: conjunctiva are pink and non-injected, sclera clear LUNGS: clear to auscultation and percussion with normal breathing effort HEART: regular rate & rhythm and no murmurs and no lower extremity edema Musculoskeletal: no cyanosis of digits and no clubbing  PSYCH: alert & oriented x 3, fluent speech NEURO: no focal motor/sensory deficits  LABORATORY DATA:  I have reviewed the data as listed CBC Latest Ref Rng & Units 09/28/2020 09/14/2020 09/01/2020  WBC 4.0 - 10.5 K/uL 6.1 5.9 5.6  Hemoglobin 13.0 - 17.0 g/dL 12.7(L) 12.9(L) 12.8(L)  Hematocrit 39 - 52 % 38.6(L) 38.8(L) 38.8(L)  Platelets 150 - 400 K/uL 180 295 196    CMP Latest Ref Rng & Units 09/28/2020 09/26/2020 09/14/2020  Glucose 70 - 99 mg/dL 95 87 109(H)  BUN 6 - 20 mg/dL 15 15 14   Creatinine 0.61 - 1.24 mg/dL 1.14 1.12 0.99  Sodium 135 - 145 mmol/L 143 145(H) 143  Potassium 3.5 - 5.1 mmol/L 3.7 3.8 2.9(L)  Chloride 98 - 111 mmol/L 105 103 103  CO2 22 - 32 mmol/L 33(H) 31(H) 33(H)  Calcium 8.9 - 10.3 mg/dL 7.9(L) 8.4(L) 7.9(L)  Total Protein 6.5 - 8.1 g/dL 4.5(L) - 4.7(L)  Total Bilirubin 0.3 - 1.2 mg/dL 0.6 - 0.4  Alkaline Phos 38 - 126 U/L 88 - 95  AST 15 - 41 U/L 17 - 18  ALT 0 - 44 U/L 9 - 10    Lab Results  Component Value Date   MPROTEIN Not Observed 08/23/2020   Lab Results  Component Value Date   KPAFRELGTCHN 18.5 08/23/2020   KPAFRELGTCHN 18.0 08/04/2020   LAMBDASER 199.1 (H) 08/23/2020   LAMBDASER 192.5 (H) 08/04/2020   KAPLAMBRATIO 0.11 (L) 08/25/2020   KAPLAMBRATIO 0.09 (L) 08/23/2020   KAPLAMBRATIO 0.09 (L) 08/04/2020  RADIOGRAPHIC STUDIES: CT Biopsy  Result Date: Sep 27, 2020 INDICATION: 38-year-old male with clinical concern for renal amyloidosis. Bone marrow biopsy requested for further workup. EXAM: CT-GUIDED BONE MARROW BIOPSY AND ASPIRATION MEDICATIONS: None ANESTHESIA/SEDATION: Fentanyl 100 mcg IV; Versed 3 mg IV Sedation  Time: 10 minutes; The patient was continuously monitored during the procedure by the interventional radiology nurse under my direct supervision. COMPLICATIONS: None immediate. PROCEDURE: Informed consent was obtained from the patient following an explanation of the procedure, risks, benefits and alternatives. The patient understands, agrees and consents for the procedure. All questions were addressed. A time out was performed prior to the initiation of the procedure. The patient was positioned prone and non-contrast localization CT was performed of the pelvis to demonstrate the iliac marrow spaces. The operative site was prepped and draped in the usual sterile fashion. Under sterile conditions and local anesthesia, a 22 gauge spinal needle was utilized for procedural planning. Next, an 11 gauge coaxial bone biopsy needle was advanced into the right iliac marrow space. Needle position was confirmed with CT imaging. Initially, a bone marrow aspiration was performed. Next, a bone marrow biopsy was obtained with the 11 gauge outer bone marrow device. The 11 gauge coaxial bone biopsy needle was re-advanced into a slightly different location within the left iliac marrow space, positioning was confirmed with CT imaging and an additional bone marrow biopsy was obtained. Samples were prepared with the cytotechnologist and deemed adequate. The needle was removed and superficial hemostasis was obtained with manual compression. A dressing was applied. The patient tolerated the procedure well without immediate post procedural complication. IMPRESSION: Successful CT guided right iliac bone marrow aspiration and core biopsy. Ruthann Cancer, MD [facility] Electronically Signed   By: Ruthann Cancer MD   On: 2020/09/27 10:42   CT BONE MARROW BIOPSY & ASPIRATION  Result Date: 09/27/2020 INDICATION: 1-year-old male with clinical concern for renal amyloidosis. Bone marrow  biopsy requested for further workup. EXAM: CT-GUIDED BONE MARROW BIOPSY AND ASPIRATION MEDICATIONS: None ANESTHESIA/SEDATION: Fentanyl 100 mcg IV; Versed 3 mg IV Sedation Time: 10 minutes; The patient was continuously monitored during the procedure by the interventional radiology nurse under my direct supervision. COMPLICATIONS: None immediate. PROCEDURE: Informed consent was obtained from the patient following an explanation of the procedure, risks, benefits and alternatives. The patient understands, agrees and consents for the procedure. All questions were addressed. A time out was performed prior to the initiation of the procedure. The patient was positioned prone and non-contrast localization CT was performed of the pelvis to demonstrate the iliac marrow spaces. The operative site was prepped and draped in the usual sterile fashion. Under sterile conditions and local anesthesia, a 22 gauge spinal needle was utilized for procedural planning. Next, an 11 gauge coaxial bone biopsy needle was advanced into the right iliac marrow space. Needle position was confirmed with CT imaging. Initially, a bone marrow aspiration was performed. Next, a bone marrow biopsy was obtained with the 11 gauge outer bone marrow device. The 11 gauge coaxial bone biopsy needle was re-advanced into a slightly different location within the left iliac marrow space, positioning was confirmed with CT imaging and an additional bone marrow biopsy was obtained. Samples were prepared with the cytotechnologist and deemed adequate. The needle was removed and superficial hemostasis was obtained with manual compression. A dressing was applied. The patient tolerated the procedure well without immediate post procedural complication. IMPRESSION: Successful CT guided right iliac bone marrow aspiration and core biopsy. Ruthann Cancer,  MD [facility] Radiology Electronically Signed   By: Ruthann Cancer MD   On:  09/01/2020 10:42   VAS [place] GROIN PSEUDOANEURYSM  Result Date: 09/13/2020  ARTERIAL PSEUDOANEURYSM  Exam: Right groin, rule out pseudoaneurysm. Indications: Patient complains of groin pain and like something was pulling/sliding in and out ever since his arterial procedure in August 2021. He says it has since "popped" and was thought to be an inguinal hernia. Performing Technologist: Mariane Masters RVT  Examination Guidelines: A complete evaluation includes B-mode imaging, spectral Doppler, color Doppler, and power Doppler as needed of all accessible portions of each vessel. Bilateral testing is considered an integral part of a complete examination. Limited examinations for reoccurring indications may be performed as noted. +------------+----------+---------+------+------------------------------+ Right DuplexPSV (cm/s)Waveform Plaque          Comment(s)           +------------+----------+---------+------+------------------------------+ CFA             66    triphasicNormal                               +------------+----------+---------+------+------------------------------+ PFA             65    triphasicNormal                               +------------+----------+---------+------+------------------------------+ Prox SFA        64    triphasicNormalminimal plaque/wall thickening +------------+----------+---------+------+------------------------------+ Right Vein comments:The common femoral vein and proximal femoral vein demonstrate normal respirophasic flow, no evidence of thrombosis.  Summary: No evidence of pseudoaneurysm, AVF or DVT  Diagnosing physician: Jenkins Rouge MD Electronically signed by Jenkins Rouge MD on 09/13/2020 at 6:06:24 PM.   --------------------------------------------------------------------------------    Final     ASSESSMENT & PLAN Samuel Butler 59 y.o. male with medical history significant for AL amyloidosis of the kidney presents for a follow up visit.   After review the labs, the records, discussion with the patient the findings are most consistent with AL amyloidosis affecting the kidney.  As such I would recommend we start treatment with Dara CyBorD.  After reviewing the biopsy results his findings are most consistent with AL amyloidosis. As such the treatment of choice would be to target his plasma cell population with a triplet or quadruplet therapy. Therapy of choice in this case would consist of daratumumab, Velcade, cyclophosphamide, and dexamethasone. Given the patient's good functional status we will start with full dose Dara-CyBorD. I previously discussed the side effects of this chemotherapy with the patient including neuropathy, elevated blood pressure, drop in blood counts, hypersensitivity reaction, chest tightness, increased infection risk, and fatigue. The patient and family voiced their understanding of these findings and are agreeable to moving forward with quadruple therapy with the goal of being a bridge to bone marrow transplant.   The regimen of choice will be daratumumab, bortezomib, cyclophosphamide and dexamethasone per the ANDROMEDA Study ( Blood. 2020 Jul 2;136(1):71-80). Treatment consists of: Cyclophosphamide 300 mg/m2 intravenously and bortezomib 1.3 mg/m2 subcutaneously were given on days 1, 8, 15, and 22 of each 28 day cycle for up to 6 cycles. Dexamethasone 40 mg (starting dose) was given orally or intravenously weekly for each cycle for up to 6 cycles. DARA Oliver was administered in a single, premixed vial and given by manual [place] injection over the course of 3 to  5 minutes weekly in cycles 1 to 2, every 2 weeks in cycles 3 to 6, and every 4 weeks thereafter as monotherapy for a maximum of 2 years. We would only consider this continued dosing regimen if patient declined or was determined not to be a candidate for BMT.   On exam today Samuel Butler the opportunity to ask any additional questions or concerns he had prior to the  start of treatment.  He and his wife were able to ask their questions and are ready and able to proceed with chemotherapy tomorrow.  #AL Amyloidosis Affecting the Kidney --bone marrow biopsy and kidney biopsy helped to confirm the diagnosis of AL amyloidosis. --proceeding with  Dara-CyBorD (Andromeda Trial), plan to start on 09/29/2020.  --will monitoring monthly SPEP, UPEP, and SFLC while on treatment. --will have patient return to clinic for weekly treatment with q 2 week clinic visits initially.   #Supportive Care --chemotherapy education performed --zofran 35m q8H PRN and compazine 151mPO q6H for nausea --acyclovir 40031mO BID for VCZ prophylaxis --albuterol HFA inhaler for daratumumab treatment -- EMLA cream for port not required, patient declines port at this time, but will reconsider once treatment starts -- no pain medication required at this time.   No orders of the defined types were placed in this encounter.   All questions were answered. The patient knows to call the clinic with any problems, questions or concerns.  A total of more than 30 minutes were spent on this encounter and over half of that time was spent on counseling and coordination of care as outlined above.   JohLedell PeoplesD Department of Hematology/Oncology [place] [facility]: [phone]: [phone]: [email or web address]  09/28/2020 3:45 PM   Literature Support:  V, Wendie Chess, Weiss BM, VerFredderick Severanceomenzo RL. Daratumumab plus CyBorD for patients with newly diagnosed AL amyloidosis: safety run-in results of ANDROMEDA. Blood. 2020 Jul 2;136(1):71-80.  --Daratumumab-CyBorD was well tolerated, with no new safety concerns versus the intravenous formulation, and demonstrated robust hematologic and organ responses.

## 2020-09-29 ENCOUNTER — Inpatient Hospital Stay: Payer: BC Managed Care – PPO

## 2020-09-29 ENCOUNTER — Other Ambulatory Visit: Payer: Self-pay

## 2020-09-29 ENCOUNTER — Other Ambulatory Visit: Payer: Self-pay | Admitting: *Deleted

## 2020-09-29 VITALS — BP 114/88 | HR 98 | Temp 98.4°F | Resp 18

## 2020-09-29 DIAGNOSIS — Z5112 Encounter for antineoplastic immunotherapy: Secondary | ICD-10-CM | POA: Diagnosis not present

## 2020-09-29 DIAGNOSIS — E8581 Light chain (AL) amyloidosis: Secondary | ICD-10-CM

## 2020-09-29 LAB — TYPE AND SCREEN
ABO/RH(D): A POS
Antibody Screen: NEGATIVE

## 2020-09-29 MED ORDER — MONTELUKAST SODIUM 10 MG PO TABS
10.0000 mg | ORAL_TABLET | Freq: Once | ORAL | Status: AC
  Administered 2020-09-29: 10 mg via ORAL

## 2020-09-29 MED ORDER — DEXAMETHASONE 4 MG PO TABS
ORAL_TABLET | ORAL | Status: AC
  Filled 2020-09-29: qty 1

## 2020-09-29 MED ORDER — DEXAMETHASONE 4 MG PO TABS
40.0000 mg | ORAL_TABLET | Freq: Once | ORAL | Status: AC
  Administered 2020-09-29: 40 mg via ORAL

## 2020-09-29 MED ORDER — BORTEZOMIB CHEMO SQ INJECTION 3.5 MG (2.5MG/ML)
1.3000 mg/m2 | Freq: Once | INTRAMUSCULAR | Status: AC
  Administered 2020-09-29: 3 mg via SUBCUTANEOUS
  Filled 2020-09-29: qty 1.2

## 2020-09-29 MED ORDER — SODIUM CHLORIDE 0.9 % IV SOLN
300.0000 mg/m2 | Freq: Once | INTRAVENOUS | Status: AC
  Administered 2020-09-29: 680 mg via INTRAVENOUS
  Filled 2020-09-29: qty 34

## 2020-09-29 MED ORDER — ACETAMINOPHEN 325 MG PO TABS
650.0000 mg | ORAL_TABLET | Freq: Once | ORAL | Status: AC
  Administered 2020-09-29: 650 mg via ORAL

## 2020-09-29 MED ORDER — DARATUMUMAB-HYALURONIDASE-FIHJ 1800-30000 MG-UT/15ML ~~LOC~~ SOLN
1800.0000 mg | Freq: Once | SUBCUTANEOUS | Status: AC
  Administered 2020-09-29: 1800 mg via SUBCUTANEOUS
  Filled 2020-09-29: qty 15

## 2020-09-29 MED ORDER — MONTELUKAST SODIUM 10 MG PO TABS
ORAL_TABLET | ORAL | Status: AC
  Filled 2020-09-29: qty 1

## 2020-09-29 MED ORDER — SODIUM CHLORIDE 0.9 % IV SOLN
Freq: Once | INTRAVENOUS | Status: AC
  Filled 2020-09-29: qty 250

## 2020-09-29 MED ORDER — DIPHENHYDRAMINE HCL 25 MG PO CAPS
50.0000 mg | ORAL_CAPSULE | Freq: Once | ORAL | Status: AC
  Administered 2020-09-29: 50 mg via ORAL

## 2020-09-29 MED ORDER — DIPHENHYDRAMINE HCL 25 MG PO CAPS
ORAL_CAPSULE | ORAL | Status: AC
  Filled 2020-09-29: qty 2

## 2020-09-29 MED ORDER — ACETAMINOPHEN 325 MG PO TABS
ORAL_TABLET | ORAL | Status: AC
  Filled 2020-09-29: qty 2

## 2020-09-29 NOTE — Progress Notes (Signed)
Patient observed for 2 hours post darzalex faspro injection. Patient tolerate injection well, no complaints. VSS upon leaving infusion room.

## 2020-09-29 NOTE — Patient Instructions (Signed)
[facility] Discharge Instructions for Patients Receiving Chemotherapy  Today you received the following chemotherapy agents: Bortezomib, cyclophosphamide, daratumumab.  To help prevent nausea and vomiting after your treatment, we encourage you to take your nausea medication as prescribed by your physician.    If you develop nausea and vomiting that is not controlled by your nausea medication, call the clinic.   BELOW ARE SYMPTOMS THAT SHOULD BE REPORTED IMMEDIATELY:  *FEVER GREATER THAN 100.5 F  *CHILLS WITH OR WITHOUT FEVER  NAUSEA AND VOMITING THAT IS NOT CONTROLLED WITH YOUR NAUSEA MEDICATION  *UNUSUAL SHORTNESS OF BREATH  *UNUSUAL BRUISING OR BLEEDING  TENDERNESS IN MOUTH AND THROAT WITH OR WITHOUT PRESENCE OF ULCERS  *URINARY PROBLEMS  *BOWEL PROBLEMS  UNUSUAL RASH Items with * indicate a potential emergency and should be followed up as soon as possible.  Feel free to call the clinic should you have any questions or concerns. The clinic phone number is [phone].  Please show the [facility] at check-in to the Emergency Department and triage nurse.  Bortezomib injection What is this medicine? BORTEZOMIB (bor TEZ oh mib) is a medicine that targets proteins in cancer cells and stops the cancer cells from growing. It is used to treat multiple myeloma and mantle-cell lymphoma. This medicine may be used for other purposes; ask your health care provider or pharmacist if you have questions. COMMON BRAND NAME(S): Velcade What should I tell my health care provider before I take this medicine? They need to know if you have any of these conditions:  diabetes  heart disease  irregular heartbeat  liver disease  on hemodialysis  low blood counts, like low white blood cells, platelets, or hemoglobin  peripheral neuropathy  taking medicine for blood pressure  an unusual or allergic reaction to bortezomib, mannitol, boron, other medicines, foods,  dyes, or preservatives  pregnant or trying to get pregnant  breast-feeding How should I use this medicine? This medicine is for injection into a vein or for injection under the skin. It is given by a health care professional in a hospital or clinic setting. Talk to your pediatrician regarding the use of this medicine in children. Special care may be needed. Overdosage: If you think you have taken too much of this medicine contact a poison control center or emergency room at once. NOTE: This medicine is only for you. Do not share this medicine with others. What if I miss a dose? It is important not to miss your dose. Call your doctor or health care professional if you are unable to keep an appointment. What may interact with this medicine? This medicine may interact with the following medications:  ketoconazole  rifampin  ritonavir  St. John's Wort This list may not describe all possible interactions. Give your health care provider a list of all the medicines, herbs, non-prescription drugs, or dietary supplements you use. Also tell them if you smoke, drink alcohol, or use illegal drugs. Some items may interact with your medicine. What should I watch for while using this medicine? You may get drowsy or dizzy. Do not drive, use machinery, or do anything that needs mental alertness until you know how this medicine affects you. Do not stand or sit up quickly, especially if you are an older patient. This reduces the risk of dizzy or fainting spells. In some cases, you may be given additional medicines to help with side effects. Follow all directions for their use. Call your doctor or health care professional for  advice if you get a fever, chills or sore throat, or other symptoms of a cold or flu. Do not treat yourself. This drug decreases your body's ability to fight infections. Try to avoid being around people who are sick. This medicine may increase your risk to bruise or bleed. Call your  doctor or health care professional if you notice any unusual bleeding. You may need blood work done while you are taking this medicine. In some patients, this medicine may cause a serious brain infection that may cause death. If you have any problems seeing, thinking, speaking, walking, or standing, tell your doctor right away. If you cannot reach your doctor, urgently seek other source of medical care. Check with your doctor or health care professional if you get an attack of severe diarrhea, nausea and vomiting, or if you sweat a lot. The loss of too much body fluid can make it dangerous for you to take this medicine. Do not become pregnant while taking this medicine or for at least 7 months after stopping it. Women should inform their doctor if they wish to become pregnant or think they might be pregnant. Men should not father a child while taking this medicine and for at least 4 months after stopping it. There is a potential for serious side effects to an unborn child. Talk to your health care professional or pharmacist for more information. Do not breast-feed an infant while taking this medicine or for 2 months after stopping it. This medicine may interfere with the ability to have a child. You should talk with your doctor or health care professional if you are concerned about your fertility. What side effects may I notice from receiving this medicine? Side effects that you should report to your doctor or health care professional as soon as possible:  allergic reactions like skin rash, itching or hives, swelling of the face, lips, or tongue  breathing problems  changes in hearing  changes in vision  fast, irregular heartbeat  feeling faint or lightheaded, falls  pain, tingling, numbness in the hands or feet  right upper belly pain  seizures  swelling of the ankles, feet, hands  unusual bleeding or bruising  unusually weak or tired  vomiting  yellowing of the eyes or skin Side  effects that usually do not require medical attention (report to your doctor or health care professional if they continue or are bothersome):  changes in emotions or moods  constipation  diarrhea  loss of appetite  headache  irritation at site where injected  nausea This list may not describe all possible side effects. Call your doctor for medical advice about side effects. You may report side effects to FDA at 1-800-FDA-1088. Where should I keep my medicine? This drug is given in a hospital or clinic and will not be stored at home. NOTE: This sheet is a summary. It may not cover all possible information. If you have questions about this medicine, talk to your doctor, pharmacist, or health care provider.  2020 Elsevier/Gold Standard (2018-03-09 16:29:31)  Cyclophosphamide Injection What is this medicine? CYCLOPHOSPHAMIDE (sye kloe FOSS fa mide) is a chemotherapy drug. It slows the growth of cancer cells. This medicine is used to treat many types of cancer like lymphoma, myeloma, leukemia, breast cancer, and ovarian cancer, to name a few. This medicine may be used for other purposes; ask your health care provider or pharmacist if you have questions. COMMON BRAND NAME(S): Cytoxan, Neosar What should I tell my health care provider before  I take this medicine? They need to know if you have any of these conditions:  heart disease  history of irregular heartbeat  infection  kidney disease  liver disease  low blood counts, like white cells, platelets, or red blood cells  on hemodialysis  recent or ongoing radiation therapy  scarring or thickening of the lungs  trouble passing urine  an unusual or allergic reaction to cyclophosphamide, other medicines, foods, dyes, or preservatives  pregnant or trying to get pregnant  breast-feeding How should I use this medicine? This drug is usually given as an injection into a vein or muscle or by infusion into a vein. It is  administered in a hospital or clinic by a specially trained health care professional. Talk to your pediatrician regarding the use of this medicine in children. Special care may be needed. Overdosage: If you think you have taken too much of this medicine contact a poison control center or emergency room at once. NOTE: This medicine is only for you. Do not share this medicine with others. What if I miss a dose? It is important not to miss your dose. Call your doctor or health care professional if you are unable to keep an appointment. What may interact with this medicine?  amphotericin B  azathioprine  certain antivirals for HIV or hepatitis  certain medicines for blood pressure, heart disease, irregular heart beat  certain medicines that treat or prevent blood clots like warfarin  certain other medicines for cancer  cyclosporine  etanercept  indomethacin  medicines that relax muscles for surgery  medicines to increase blood counts  metronidazole This list may not describe all possible interactions. Give your health care provider a list of all the medicines, herbs, non-prescription drugs, or dietary supplements you use. Also tell them if you smoke, drink alcohol, or use illegal drugs. Some items may interact with your medicine. What should I watch for while using this medicine? Your condition will be monitored carefully while you are receiving this medicine. You may need blood work done while you are taking this medicine. Drink water or other fluids as directed. Urinate often, even at night. Some products may contain alcohol. Ask your health care professional if this medicine contains alcohol. Be sure to tell all health care professionals you are taking this medicine. Certain medicines, like metronidazole and disulfiram, can cause an unpleasant reaction when taken with alcohol. The reaction includes flushing, headache, nausea, vomiting, sweating, and increased thirst. The reaction  can last from 30 minutes to several hours. Do not become pregnant while taking this medicine or for 1 year after stopping it. Women should inform their health care professional if they wish to become pregnant or think they might be pregnant. Men should not father a child while taking this medicine and for 4 months after stopping it. There is potential for serious side effects to an unborn child. Talk to your health care professional for more information. Do not breast-feed an infant while taking this medicine or for 1 week after stopping it. This medicine has caused ovarian failure in some women. This medicine may make it more difficult to get pregnant. Talk to your health care professional if you are concerned about your fertility. This medicine has caused decreased sperm counts in some men. This may make it more difficult to father a child. Talk to your health care professional if you are concerned about your fertility. Call your health care professional for advice if you get a fever, chills, or sore throat,  or other symptoms of a cold or flu. Do not treat yourself. This medicine decreases your body's ability to fight infections. Try to avoid being around people who are sick. Avoid taking medicines that contain aspirin, acetaminophen, ibuprofen, naproxen, or ketoprofen unless instructed by your health care professional. These medicines may hide a fever. Talk to your health care professional about your risk of cancer. You may be more at risk for certain types of cancer if you take this medicine. If you are going to need surgery or other procedure, tell your health care professional that you are using this medicine. Be careful brushing or flossing your teeth or using a toothpick because you may get an infection or bleed more easily. If you have any dental work done, tell your dentist you are receiving this medicine. What side effects may I notice from receiving this medicine? Side effects that you should  report to your doctor or health care professional as soon as possible:  allergic reactions like skin rash, itching or hives, swelling of the face, lips, or tongue  breathing problems  nausea, vomiting  signs and symptoms of bleeding such as bloody or black, tarry stools; red or dark brown urine; spitting up blood or brown material that looks like coffee grounds; red spots on the skin; unusual bruising or bleeding from the eyes, gums, or nose  signs and symptoms of heart failure like fast, irregular heartbeat, sudden weight gain; swelling of the ankles, feet, hands  signs and symptoms of infection like fever; chills; cough; sore throat; pain or trouble passing urine  signs and symptoms of kidney injury like trouble passing urine or change in the amount of urine  signs and symptoms of liver injury like dark yellow or brown urine; general ill feeling or flu-like symptoms; light-colored stools; loss of appetite; nausea; right upper belly pain; unusually weak or tired; yellowing of the eyes or skin Side effects that usually do not require medical attention (report to your doctor or health care professional if they continue or are bothersome):  confusion  decreased hearing  diarrhea  facial flushing  hair loss  headache  loss of appetite  missed menstrual periods  signs and symptoms of low red blood cells or anemia such as unusually weak or tired; feeling faint or lightheaded; falls  skin discoloration This list may not describe all possible side effects. Call your doctor for medical advice about side effects. You may report side effects to FDA at 1-800-FDA-1088. Where should I keep my medicine? This drug is given in a hospital or clinic and will not be stored at home. NOTE: This sheet is a summary. It may not cover all possible information. If you have questions about this medicine, talk to your doctor, pharmacist, or health care provider.  2020 Elsevier/Gold Standard  (2019-08-02 09:53:29)  Daratumumab injection What is this medicine? DARATUMUMAB (dar a toom ue mab) is a monoclonal antibody. It is used to treat multiple myeloma. This medicine may be used for other purposes; ask your health care provider or pharmacist if you have questions. COMMON BRAND NAME(S): DARZALEX What should I tell my health care provider before I take this medicine? They need to know if you have any of these conditions:  infection (especially a virus infection such as chickenpox, herpes, or hepatitis B virus)  lung or breathing disease  an unusual or allergic reaction to daratumumab, other medicines, foods, dyes, or preservatives  pregnant or trying to get pregnant  breast-feeding How should I use this  medicine? This medicine is for infusion into a vein. It is given by a health care professional in a hospital or clinic setting. Talk to your pediatrician regarding the use of this medicine in children. Special care may be needed. Overdosage: If you think you have taken too much of this medicine contact a poison control center or emergency room at once. NOTE: This medicine is only for you. Do not share this medicine with others. What if I miss a dose? Keep appointments for follow-up doses as directed. It is important not to miss your dose. Call your doctor or health care professional if you are unable to keep an appointment. What may interact with this medicine? Interactions have not been studied. This list may not describe all possible interactions. Give your health care provider a list of all the medicines, herbs, non-prescription drugs, or dietary supplements you use. Also tell them if you smoke, drink alcohol, or use illegal drugs. Some items may interact with your medicine. What should I watch for while using this medicine? This drug may make you feel generally unwell. Report any side effects. Continue your course of treatment even though you feel ill unless your doctor  tells you to stop. This medicine can cause serious allergic reactions. To reduce your risk you may need to take medicine before treatment with this medicine. Take your medicine as directed. This medicine can affect the results of blood tests to match your blood type. These changes can last for up to 6 months after the final dose. Your healthcare provider will do blood tests to match your blood type before you start treatment. Tell all of your healthcare providers that you are being treated with this medicine before receiving a blood transfusion. This medicine can affect the results of some tests used to determine treatment response; extra tests may be needed to evaluate response. Do not become pregnant while taking this medicine or for 3 months after stopping it. Women should inform their doctor if they wish to become pregnant or think they might be pregnant. There is a potential for serious side effects to an unborn child. Talk to your health care professional or pharmacist for more information. What side effects may I notice from receiving this medicine? Side effects that you should report to your doctor or health care professional as soon as possible:  allergic reactions like skin rash, itching or hives, swelling of the face, lips, or tongue  breathing problems  chills  cough  dizziness  feeling faint or lightheaded  headache  low blood counts - this medicine may decrease the number of white blood cells, red blood cells and platelets. You may be at increased risk for infections and bleeding.  nausea, vomiting  shortness of breath  signs of decreased platelets or bleeding - bruising, pinpoint red spots on the skin, black, tarry stools, blood in the urine  signs of decreased red blood cells - unusually weak or tired, feeling faint or lightheaded, falls  signs of infection - fever or chills, cough, sore throat, pain or difficulty passing urine  signs and symptoms of liver injury  like dark yellow or brown urine; general ill feeling or flu-like symptoms; light-colored stools; loss of appetite; right upper belly pain; unusually weak or tired; yellowing of the eyes or skin Side effects that usually do not require medical attention (report to your doctor or health care professional if they continue or are bothersome):  back pain  constipation  diarrhea  joint pain  muscle cramps  pain, tingling, numbness in the hands or feet  swelling of the ankles, feet, hands  tiredness  trouble sleeping This list may not describe all possible side effects. Call your doctor for medical advice about side effects. You may report side effects to FDA at 1-800-FDA-1088. Where should I keep my medicine? This drug is given in a hospital or clinic and will not be stored at home. NOTE: This sheet is a summary. It may not cover all possible information. If you have questions about this medicine, talk to your doctor, pharmacist, or health care provider.  2020 Elsevier/Gold Standard (2019-07-06 18:10:54)

## 2020-09-29 NOTE — Telephone Encounter (Signed)
FLMA form completed and patient is aware.  Copy made for the chart.

## 2020-09-30 LAB — PRETREATMENT RBC PHENOTYPE

## 2020-10-02 ENCOUNTER — Telehealth: Payer: Self-pay | Admitting: *Deleted

## 2020-10-02 NOTE — Telephone Encounter (Signed)
Spoke to pt this pm & he denies any problems except still has swelling in joints. This has not changed.  Encouraged to call with any concerns.

## 2020-10-02 NOTE — Telephone Encounter (Signed)
-----   Message from Teodoro Spray, RN sent at 09/29/2020  3:49 PM EST ----- Regarding: Samuel Butler first time chemo follow-up Dr. [name] first time velcade, cytoxan, darzalex faspro on 09/29/20. Patient tolerated treatment well. Thank you

## 2020-10-02 NOTE — Telephone Encounter (Signed)
Left message for pt to return call to let us know how he did with his treatment last Friday.

## 2020-10-04 ENCOUNTER — Other Ambulatory Visit: Payer: Self-pay

## 2020-10-04 ENCOUNTER — Encounter: Payer: Self-pay | Admitting: Internal Medicine

## 2020-10-04 ENCOUNTER — Telehealth: Payer: Self-pay | Admitting: *Deleted

## 2020-10-04 ENCOUNTER — Inpatient Hospital Stay (HOSPITAL_BASED_OUTPATIENT_CLINIC_OR_DEPARTMENT_OTHER): Payer: BC Managed Care – PPO | Admitting: Medical

## 2020-10-04 ENCOUNTER — Encounter: Payer: Self-pay | Admitting: Hematology and Oncology

## 2020-10-04 VITALS — BP 125/93 | HR 105 | Temp 97.0°F | Resp 17 | Ht 71.0 in | Wt 240.7 lb

## 2020-10-04 DIAGNOSIS — L03311 Cellulitis of abdominal wall: Secondary | ICD-10-CM

## 2020-10-04 DIAGNOSIS — E854 Organ-limited amyloidosis: Secondary | ICD-10-CM | POA: Diagnosis not present

## 2020-10-04 DIAGNOSIS — Z5112 Encounter for antineoplastic immunotherapy: Secondary | ICD-10-CM | POA: Diagnosis not present

## 2020-10-04 MED ORDER — CEPHALEXIN 500 MG PO CAPS: 500.0000 mg | ORAL_CAPSULE | Freq: Three times a day (TID) | ORAL | 0 refills | Status: DC

## 2020-10-04 NOTE — Telephone Encounter (Signed)
Wife called on behalf of patient with concerns of redness and tenderness to abdomen at site of injection.  Scheduling message sent for patient to see Sandi Mealy, PA at 2pm today.  Patient's wife verbalized understanding.

## 2020-10-06 ENCOUNTER — Other Ambulatory Visit: Payer: Self-pay

## 2020-10-06 ENCOUNTER — Other Ambulatory Visit: Payer: Self-pay | Admitting: Hematology and Oncology

## 2020-10-06 ENCOUNTER — Inpatient Hospital Stay: Payer: BC Managed Care – PPO

## 2020-10-06 VITALS — BP 132/95 | HR 83 | Temp 98.6°F | Resp 18

## 2020-10-06 DIAGNOSIS — Z5112 Encounter for antineoplastic immunotherapy: Secondary | ICD-10-CM | POA: Diagnosis not present

## 2020-10-06 DIAGNOSIS — E8581 Light chain (AL) amyloidosis: Secondary | ICD-10-CM

## 2020-10-06 LAB — CMP (CANCER CENTER ONLY)
ALT: 18 U/L (ref 0–44)
AST: 18 U/L (ref 15–41)
Albumin: 1.5 g/dL — ABNORMAL LOW (ref 3.5–5.0)
Alkaline Phosphatase: 92 U/L (ref 38–126)
Anion gap: 7 (ref 5–15)
BUN: 17 mg/dL (ref 6–20)
CO2: 31 mmol/L (ref 22–32)
Calcium: 8 mg/dL — ABNORMAL LOW (ref 8.9–10.3)
Chloride: 102 mmol/L (ref 98–111)
Creatinine: 1.22 mg/dL (ref 0.61–1.24)
GFR, Estimated: >60 mL/min (ref >60)
Glucose, Bld: 114 mg/dL — ABNORMAL HIGH (ref 70–99)
Potassium: 3.5 mmol/L (ref 3.5–5.1)
Sodium: 140 mmol/L (ref 135–145)
Total Bilirubin: 0.7 mg/dL (ref 0.3–1.2)
Total Protein: 4.6 g/dL — ABNORMAL LOW (ref 6.5–8.1)

## 2020-10-06 LAB — CBC WITH DIFFERENTIAL (CANCER CENTER ONLY)
Abs Immature Granulocytes: 0.02 10*3/uL (ref 0.00–0.07)
Basophils Absolute: 0 10*3/uL (ref 0.0–0.1)
Basophils Relative: 0 %
Eosinophils Absolute: 0.2 10*3/uL (ref 0.0–0.5)
Eosinophils Relative: 4 %
HCT: 39.8 % (ref 39.0–52.0)
Hemoglobin: 13.1 g/dL (ref 13.0–17.0)
Immature Granulocytes: 0 %
Lymphocytes Relative: 33 %
Lymphs Abs: 1.8 10*3/uL (ref 0.7–4.0)
MCH: 30.7 pg (ref 26.0–34.0)
MCHC: 32.9 g/dL (ref 30.0–36.0)
MCV: 93.2 fL (ref 80.0–100.0)
Monocytes Absolute: 0.3 10*3/uL (ref 0.1–1.0)
Monocytes Relative: 6 %
Neutro Abs: 3 10*3/uL (ref 1.7–7.7)
Neutrophils Relative %: 57 %
Platelet Count: 193 10*3/uL (ref 150–400)
RBC: 4.27 MIL/uL (ref 4.22–5.81)
RDW: 14.6 % (ref 11.5–15.5)
WBC Count: 5.3 10*3/uL (ref 4.0–10.5)
nRBC: 0 % (ref 0.0–0.2)

## 2020-10-06 LAB — LACTATE DEHYDROGENASE: LDH: 272 U/L — ABNORMAL HIGH (ref 98–192)

## 2020-10-06 MED ORDER — MONTELUKAST SODIUM 10 MG PO TABS
ORAL_TABLET | ORAL | Status: AC
  Filled 2020-10-06: qty 1

## 2020-10-06 MED ORDER — DIPHENHYDRAMINE HCL 25 MG PO CAPS
ORAL_CAPSULE | ORAL | Status: AC
  Filled 2020-10-06: qty 2

## 2020-10-06 MED ORDER — BORTEZOMIB CHEMO SQ INJECTION 3.5 MG (2.5MG/ML)
1.3000 mg/m2 | Freq: Once | INTRAMUSCULAR | Status: AC
  Administered 2020-10-06: 3 mg via SUBCUTANEOUS
  Filled 2020-10-06: qty 1.2

## 2020-10-06 MED ORDER — DEXAMETHASONE 4 MG PO TABS
40.0000 mg | ORAL_TABLET | Freq: Once | ORAL | Status: AC
  Administered 2020-10-06: 40 mg via ORAL

## 2020-10-06 MED ORDER — DARATUMUMAB-HYALURONIDASE-FIHJ 1800-30000 MG-UT/15ML ~~LOC~~ SOLN
1800.0000 mg | Freq: Once | SUBCUTANEOUS | Status: AC
  Administered 2020-10-06: 1800 mg via SUBCUTANEOUS
  Filled 2020-10-06: qty 15

## 2020-10-06 MED ORDER — ACETAMINOPHEN 325 MG PO TABS
ORAL_TABLET | ORAL | Status: AC
  Filled 2020-10-06: qty 2

## 2020-10-06 MED ORDER — SODIUM CHLORIDE 0.9 % IV SOLN
300.0000 mg/m2 | Freq: Once | INTRAVENOUS | Status: AC
  Administered 2020-10-06: 680 mg via INTRAVENOUS
  Filled 2020-10-06: qty 34

## 2020-10-06 MED ORDER — SODIUM CHLORIDE 0.9 % IV SOLN
Freq: Once | INTRAVENOUS | Status: AC
  Filled 2020-10-06: qty 250

## 2020-10-06 MED ORDER — ACETAMINOPHEN 325 MG PO TABS
650.0000 mg | ORAL_TABLET | Freq: Once | ORAL | Status: AC
  Administered 2020-10-06: 650 mg via ORAL

## 2020-10-06 MED ORDER — DEXAMETHASONE 4 MG PO TABS
ORAL_TABLET | ORAL | Status: AC
  Filled 2020-10-06: qty 10

## 2020-10-06 MED ORDER — MONTELUKAST SODIUM 10 MG PO TABS
10.0000 mg | ORAL_TABLET | Freq: Once | ORAL | Status: AC
  Administered 2020-10-06: 10 mg via ORAL

## 2020-10-06 MED ORDER — DIPHENHYDRAMINE HCL 25 MG PO CAPS
50.0000 mg | ORAL_CAPSULE | Freq: Once | ORAL | Status: AC
  Administered 2020-10-06: 50 mg via ORAL

## 2020-10-06 NOTE — Patient Instructions (Signed)
[facility] Discharge Instructions for Patients Receiving Chemotherapy  Today you received the following chemotherapy agents: Bortezomib, cyclophosphamide, daratumumab.  To help prevent nausea and vomiting after your treatment, we encourage you to take your nausea medication as prescribed by your physician.    If you develop nausea and vomiting that is not controlled by your nausea medication, call the clinic.   BELOW ARE SYMPTOMS THAT SHOULD BE REPORTED IMMEDIATELY:  *FEVER GREATER THAN 100.5 F  *CHILLS WITH OR WITHOUT FEVER  NAUSEA AND VOMITING THAT IS NOT CONTROLLED WITH YOUR NAUSEA MEDICATION  *UNUSUAL SHORTNESS OF BREATH  *UNUSUAL BRUISING OR BLEEDING  TENDERNESS IN MOUTH AND THROAT WITH OR WITHOUT PRESENCE OF ULCERS  *URINARY PROBLEMS  *BOWEL PROBLEMS  UNUSUAL RASH Items with * indicate a potential emergency and should be followed up as soon as possible.  Feel free to call the clinic should you have any questions or concerns. The clinic phone number is [phone].  Please show the [facility] at check-in to the Emergency Department and triage nurse.

## 2020-10-06 NOTE — Progress Notes (Signed)
Pt remained for entire post infusion 1 hour wait period.  Tolerated well.  Stable at d/c.  Ambulatory, steady gait.

## 2020-10-07 NOTE — Progress Notes (Signed)
[facility] Progress Note   Samuel Butler [id number] 10/26/1961 59 y.o.  Samuel Butler is managed by Dr. Narda Rutherford  Actively treated with chemotherapy/immunotherapy/hormonal therapy: yes  Current therapy: Velcade, Cytoxan, and Darzalex  Last treated: 09/29/2020 (cycle 1, day 1)  Next scheduled appointment with provider: 10/13/2020  Assessment: Plan:    Cellulitis of abdominal wall - Plan: cephALEXin (KEFLEX) 500 MG capsule  Organ-limited amyloidosis ([place])   Cellulitis of the left abdominal wall: The patient was given Keflex 500 mg p.o. 3 times daily x7 days.  Amyloidosis of the kidneys: Samuel Butler continues to be followed by Dr. Narda Rutherford and is status post cycle 1, day 1 of Velcade, Cytoxan, and Darzalex.  Please see After Visit Summary for patient specific instructions.  Future Appointments  Date Time Provider [place]  10/13/2020  8:00 AM [facility] None  10/13/2020  8:30 AM Ledell Peoples IV, MD [facility] None  10/13/2020  9:30 AM [facility] INFUSION [facility] None  10/18/2020 12:15 PM [facility] 1 [facility]  10/20/2020  9:15 AM [facility] LAB [facility] None  10/20/2020 10:15 AM [facility] INFUSION [facility] None  10/27/2020 10:30 AM [facility] LAB [facility] None  10/27/2020 11:00 AM Orson Slick, MD [facility] None  10/27/2020 12:00 PM [facility] INFUSION [facility] None  11/02/2020  8:45 AM [facility] LAB [facility] None  11/02/2020  9:45 AM [facility] INFUSION [facility] None  11/09/2020  9:15 AM [facility] None  11/09/2020 10:15 AM [facility] INFUSION [facility] None  11/17/2020 11:00 AM [facility] LAB [facility] None  11/17/2020 11:30 AM Orson Slick, MD [facility] None  11/17/2020 12:30 PM [facility] INFUSION [facility] None  11/24/2020  9:15 AM [facility] LAB [facility] None  11/24/2020 10:15 AM [facility] INFUSION [facility] None  11/27/2020 10:30 AM Mignon Pine, DO [facility]  03/26/2021  1:00 PM [facility] NL VASC 1 [facility]  03/26/2021  2:00 PM [facility] NL VASC 1 [facility]    No orders of the defined types were placed in this encounter.      Subjective:   Patient ID:  Samuel Butler is a 59 y.o. (DOB 03-Apr-1961) male.  Chief Complaint: No chief complaint on file.   HPI Samuel Butler  is a 59 y.o. male with a diagnosis of amyloidosis of the kidney.  He is followed by Dr. Narda Rutherford and is status post cycle 1, day 1 of Velcade, Cytoxan, and Darzalex which was dosed on 09/29/2020.  He presents to the clinic today with erythema and hyperpigmentation at the site of his Darzalex injection in his left abdomen.  He denies fevers, chills, or sweats.   Medications: I have reviewed the patient's current medications.  Allergies:  Allergies  Allergen Reactions  . Crestor [Rosuvastatin Calcium] Other (See Comments)    Joint aches    Past Medical History:  Diagnosis Date  . DDD (degenerative disc disease), lumbar    per his report  . Diabetes mellitus without complication ([place])   . Gout 04/16/2013  . Hyperlipidemia   . Hypertension   . IBD (inflammatory bowel disease) 04/05/2016  . Tobacco use disorder 03/28/2015    Past Surgical History:  Procedure Laterality Date  . ABDOMINAL AORTOGRAM W/LOWER EXTREMITY Right 06/26/2020   Procedure: ABDOMINAL AORTOGRAM W/LOWER EXTREMITY;  Surgeon: Lorretta Harp, MD;  Location: [facility];  Service: Cardiovascular;  Laterality: Right;  . COLONOSCOPY    . PERIPHERAL VASCULAR INTERVENTION  06/26/2020   Procedure: PERIPHERAL VASCULAR  INTERVENTION;  Surgeon: Lorretta Harp, MD;  Location: [facility];  Service: Cardiovascular;;  Right SFA  . TONSILLECTOMY      Family History  Problem Relation Age of Onset  . Hypertension Father   . Diabetes Father   . Stroke Father     Social History   Socioeconomic History  . Marital status: Married    Spouse name: Not on  file  . Number of children: Not on file  . Years of education: Not on file  . Highest education level: Not on file  Occupational History  . Not on file  Tobacco Use  . Smoking status: Former Smoker    Types: Cigarettes    Quit date: 10/23/2016    Years since quitting: 3.9  . Smokeless tobacco: Never Used  . Tobacco comment: per patient 5 cigarettes a day   Vaping Use  . Vaping Use: Never used  Substance and Sexual Activity  . Alcohol use: Yes    Comment: occasional  . Drug use: No  . Sexual activity: Not on file  Other Topics Concern  . 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 Difficulty of Paying Living Expenses: Not on file  Food Insecurity:   . Worried About Charity fundraiser in the Last Year: Not on file  . Ran Out of Food in the Last Year: Not on file  Transportation Needs:   . Lack of Transportation (Medical): Not on file  . Lack of Transportation (Non-Medical): Not on file  Physical Activity:   . Days of Exercise per Week: Not on file  . Minutes of Exercise per Session: Not on file  Stress:   . Feeling of Stress : Not on file  Social Connections:   . Frequency of Communication with Friends and Family: Not on file  . Frequency of Social Gatherings with Friends and Family: Not on file  . Attends Religious Services: Not on file  . Active Member of Clubs or Organizations: Not on file  . Attends Archivist Meetings: Not on file  . Marital Status: Not on file  Intimate Partner Violence:   . Fear of Current or Ex-Partner: Not on file  . Emotionally Abused: Not on file  . Physically Abused: Not on file  . Sexually Abused: Not on file    Past Medical History, Surgical history, Social history, and Family  history were reviewed and updated as appropriate.   Please see review of systems for further details on the patient's review from today.   Review of Systems:  Review of Systems  Constitutional: Negative for chills, diaphoresis and fever.  HENT: Negative for facial swelling and trouble swallowing.   Respiratory: Negative for cough, chest tightness and shortness of breath.   Cardiovascular: Negative for chest pain.  Skin: Positive for color change (Erythema and hyperpigmentation of the left abdomen at the site of a recent Darzalex injection.). Negative for rash.    Objective:   Physical Exam:  BP (!) 125/93 (BP Location: Left Arm, Patient Position: Sitting)   Pulse (!) 105   Temp (!) 97 F (36.1 C) (Tympanic)   Resp  17   Ht 5\' 11"  (1.803 m)   Wt 240 lb 11.2 oz (109.2 kg)   SpO2 97%   BMI 33.57 kg/m  ECOG: 0  Physical Exam Constitutional:      General: He is not in acute distress.    Appearance: He is not diaphoretic.  HENT:     Head: Normocephalic and atraumatic.  Cardiovascular:     Heart sounds: No gallop.   Musculoskeletal:        General: No deformity.  Skin:    General: Skin is warm and dry.     Findings: Erythema present. No rash.       Neurological:     Mental Status: He is alert.     Coordination: Coordination normal.        Lab Review:     Component Value Date/Time   NA 140 10/06/2020 1032   NA 145 (H) 09/26/2020 1045   K 3.5 10/06/2020 1032   CL 102 10/06/2020 1032   CO2 31 10/06/2020 1032   GLUCOSE 114 (H) 10/06/2020 1032   BUN 17 10/06/2020 1032   BUN 15 09/26/2020 1045   CREATININE 1.22 10/06/2020 1032   CREATININE 1.03 08/22/2020 0955   CALCIUM 8.0 (L) 10/06/2020 1032   CALCIUM 7.6 (L) 08/04/2020 0859   PROT 4.6 (L) 10/06/2020 1032   ALBUMIN 1.5 (L) 10/06/2020 1032   AST 18 10/06/2020 1032   ALT 18 10/06/2020 1032   ALT 30 08/22/2020 0955   ALKPHOS 92 10/06/2020 1032   BILITOT 0.7 10/06/2020 1032   GFRNONAA >60 10/06/2020 1032    GFRNONAA 79 08/22/2020 0955   GFRAA 82 09/26/2020 1045   GFRAA 91 08/22/2020 0955       Component Value Date/Time   WBC 5.3 10/06/2020 1032   WBC 5.6 09/01/2020 0708   RBC 4.27 10/06/2020 1032   HGB 13.1 10/06/2020 1032   HGB 14.2 06/23/2020 1034   HCT 39.8 10/06/2020 1032   HCT 43.7 06/23/2020 1034   PLT 193 10/06/2020 1032   PLT 351 06/23/2020 1034   MCV 93.2 10/06/2020 1032   MCV 90 06/23/2020 1034   MCH 30.7 10/06/2020 1032   MCHC 32.9 10/06/2020 1032   RDW 14.6 10/06/2020 1032   RDW 13.3 06/23/2020 1034   LYMPHSABS 1.8 10/06/2020 1032   MONOABS 0.3 10/06/2020 1032   EOSABS 0.2 10/06/2020 1032   BASOSABS 0.0 10/06/2020 1032   -------------------------------  Imaging from last 24 hours (if applicable):  Radiology interpretation: VAS [place] GROIN PSEUDOANEURYSM  Result Date: 09/13/2020  ARTERIAL PSEUDOANEURYSM  Exam: Right groin, rule out pseudoaneurysm. Indications: Patient complains of groin pain and like something was pulling/sliding in and out ever since his arterial procedure in August 2021. He says it has since "popped" and was thought to be an inguinal hernia. Performing Technologist: Mariane Masters RVT  Examination Guidelines: A complete evaluation includes B-mode imaging, spectral Doppler, color Doppler, and power Doppler as needed of all accessible portions of each vessel. Bilateral testing is considered an integral part of a complete examination. Limited examinations for reoccurring indications may be performed as noted. +------------+----------+---------+------+------------------------------+ Right DuplexPSV (cm/s)Waveform Plaque          Comment(s)           +------------+----------+---------+------+------------------------------+ CFA             66    triphasicNormal                               +------------+----------+---------+------+------------------------------+  PFA             65    triphasicNormal                                +------------+----------+---------+------+------------------------------+ Prox SFA        64    triphasicNormalminimal plaque/wall thickening +------------+----------+---------+------+------------------------------+ Right Vein comments:The common femoral vein and proximal femoral vein demonstrate normal respirophasic flow, no evidence of thrombosis.  Summary: No evidence of pseudoaneurysm, AVF or DVT  Diagnosing physician: Jenkins Rouge MD Electronically signed by Jenkins Rouge MD on 09/13/2020 at 6:06:24 PM.   --------------------------------------------------------------------------------    Final

## 2020-10-12 ENCOUNTER — Encounter: Payer: Self-pay | Admitting: Hematology and Oncology

## 2020-10-13 ENCOUNTER — Inpatient Hospital Stay: Payer: BC Managed Care – PPO

## 2020-10-13 ENCOUNTER — Encounter: Payer: Self-pay | Admitting: Hematology and Oncology

## 2020-10-13 ENCOUNTER — Inpatient Hospital Stay: Payer: BC Managed Care – PPO | Attending: Hematology and Oncology | Admitting: Hematology and Oncology

## 2020-10-13 ENCOUNTER — Other Ambulatory Visit: Payer: Self-pay | Admitting: Hematology and Oncology

## 2020-10-13 ENCOUNTER — Other Ambulatory Visit: Payer: Self-pay

## 2020-10-13 VITALS — BP 130/95 | HR 102 | Temp 96.9°F | Resp 18 | Ht 71.0 in | Wt 239.7 lb

## 2020-10-13 VITALS — BP 119/82 | HR 94 | Temp 97.8°F

## 2020-10-13 DIAGNOSIS — K625 Hemorrhage of anus and rectum: Secondary | ICD-10-CM | POA: Diagnosis not present

## 2020-10-13 DIAGNOSIS — I700 Atherosclerosis of aorta: Secondary | ICD-10-CM | POA: Insufficient documentation

## 2020-10-13 DIAGNOSIS — I10 Essential (primary) hypertension: Secondary | ICD-10-CM | POA: Insufficient documentation

## 2020-10-13 DIAGNOSIS — E785 Hyperlipidemia, unspecified: Secondary | ICD-10-CM | POA: Insufficient documentation

## 2020-10-13 DIAGNOSIS — L03311 Cellulitis of abdominal wall: Secondary | ICD-10-CM | POA: Diagnosis not present

## 2020-10-13 DIAGNOSIS — Z87891 Personal history of nicotine dependence: Secondary | ICD-10-CM | POA: Insufficient documentation

## 2020-10-13 DIAGNOSIS — N049 Nephrotic syndrome with unspecified morphologic changes: Secondary | ICD-10-CM | POA: Diagnosis not present

## 2020-10-13 DIAGNOSIS — Z833 Family history of diabetes mellitus: Secondary | ICD-10-CM | POA: Insufficient documentation

## 2020-10-13 DIAGNOSIS — Z800 Family history of malignant neoplasm of digestive organs: Secondary | ICD-10-CM | POA: Insufficient documentation

## 2020-10-13 DIAGNOSIS — E859 Amyloidosis, unspecified: Secondary | ICD-10-CM | POA: Diagnosis not present

## 2020-10-13 DIAGNOSIS — Z7952 Long term (current) use of systemic steroids: Secondary | ICD-10-CM | POA: Insufficient documentation

## 2020-10-13 DIAGNOSIS — E8581 Light chain (AL) amyloidosis: Secondary | ICD-10-CM

## 2020-10-13 DIAGNOSIS — Z823 Family history of stroke: Secondary | ICD-10-CM | POA: Diagnosis not present

## 2020-10-13 DIAGNOSIS — R21 Rash and other nonspecific skin eruption: Secondary | ICD-10-CM | POA: Insufficient documentation

## 2020-10-13 DIAGNOSIS — R9431 Abnormal electrocardiogram [ECG] [EKG]: Secondary | ICD-10-CM | POA: Diagnosis not present

## 2020-10-13 DIAGNOSIS — K589 Irritable bowel syndrome without diarrhea: Secondary | ICD-10-CM | POA: Insufficient documentation

## 2020-10-13 DIAGNOSIS — E119 Type 2 diabetes mellitus without complications: Secondary | ICD-10-CM | POA: Insufficient documentation

## 2020-10-13 DIAGNOSIS — R5383 Other fatigue: Secondary | ICD-10-CM | POA: Diagnosis not present

## 2020-10-13 DIAGNOSIS — Z8249 Family history of ischemic heart disease and other diseases of the circulatory system: Secondary | ICD-10-CM | POA: Insufficient documentation

## 2020-10-13 DIAGNOSIS — E854 Organ-limited amyloidosis: Secondary | ICD-10-CM | POA: Insufficient documentation

## 2020-10-13 DIAGNOSIS — Z5111 Encounter for antineoplastic chemotherapy: Secondary | ICD-10-CM | POA: Diagnosis present

## 2020-10-13 DIAGNOSIS — Z79899 Other long term (current) drug therapy: Secondary | ICD-10-CM | POA: Diagnosis not present

## 2020-10-13 DIAGNOSIS — I209 Angina pectoris, unspecified: Secondary | ICD-10-CM | POA: Diagnosis not present

## 2020-10-13 DIAGNOSIS — B181 Chronic viral hepatitis B without delta-agent: Secondary | ICD-10-CM

## 2020-10-13 DIAGNOSIS — I2699 Other pulmonary embolism without acute cor pulmonale: Secondary | ICD-10-CM | POA: Diagnosis not present

## 2020-10-13 DIAGNOSIS — Z5112 Encounter for antineoplastic immunotherapy: Secondary | ICD-10-CM | POA: Insufficient documentation

## 2020-10-13 LAB — CMP (CANCER CENTER ONLY)
ALT: 15 U/L (ref 0–44)
AST: 16 U/L (ref 15–41)
Albumin: 1.5 g/dL — ABNORMAL LOW (ref 3.5–5.0)
Alkaline Phosphatase: 80 U/L (ref 38–126)
Anion gap: 7 (ref 5–15)
BUN: 19 mg/dL (ref 6–20)
CO2: 30 mmol/L (ref 22–32)
Calcium: 7.8 mg/dL — ABNORMAL LOW (ref 8.9–10.3)
Chloride: 104 mmol/L (ref 98–111)
Creatinine: 1.09 mg/dL (ref 0.61–1.24)
GFR, Estimated: >60 mL/min (ref >60)
Glucose, Bld: 111 mg/dL — ABNORMAL HIGH (ref 70–99)
Potassium: 3.4 mmol/L — ABNORMAL LOW (ref 3.5–5.1)
Sodium: 141 mmol/L (ref 135–145)
Total Bilirubin: 0.6 mg/dL (ref 0.3–1.2)
Total Protein: 4.3 g/dL — ABNORMAL LOW (ref 6.5–8.1)

## 2020-10-13 LAB — CBC WITH DIFFERENTIAL (CANCER CENTER ONLY)
Abs Immature Granulocytes: 0 10*3/uL (ref 0.00–0.07)
Basophils Absolute: 0 10*3/uL (ref 0.0–0.1)
Basophils Relative: 0 %
Eosinophils Absolute: 0 10*3/uL (ref 0.0–0.5)
Eosinophils Relative: 1 %
HCT: 40.4 % (ref 39.0–52.0)
Hemoglobin: 13.2 g/dL (ref 13.0–17.0)
Immature Granulocytes: 0 %
Lymphocytes Relative: 30 %
Lymphs Abs: 1.2 10*3/uL (ref 0.7–4.0)
MCH: 30.2 pg (ref 26.0–34.0)
MCHC: 32.7 g/dL (ref 30.0–36.0)
MCV: 92.4 fL (ref 80.0–100.0)
Monocytes Absolute: 0.3 10*3/uL (ref 0.1–1.0)
Monocytes Relative: 7 %
Neutro Abs: 2.4 10*3/uL (ref 1.7–7.7)
Neutrophils Relative %: 62 %
Platelet Count: 163 10*3/uL (ref 150–400)
RBC: 4.37 MIL/uL (ref 4.22–5.81)
RDW: 14.6 % (ref 11.5–15.5)
WBC Count: 3.9 10*3/uL — ABNORMAL LOW (ref 4.0–10.5)
nRBC: 0 % (ref 0.0–0.2)

## 2020-10-13 LAB — LACTATE DEHYDROGENASE: LDH: 356 U/L — ABNORMAL HIGH (ref 98–192)

## 2020-10-13 MED ORDER — ACETAMINOPHEN 325 MG PO TABS
ORAL_TABLET | ORAL | Status: AC
  Filled 2020-10-13: qty 2

## 2020-10-13 MED ORDER — MONTELUKAST SODIUM 10 MG PO TABS
ORAL_TABLET | ORAL | Status: AC
  Filled 2020-10-13: qty 1

## 2020-10-13 MED ORDER — MONTELUKAST SODIUM 10 MG PO TABS
10.0000 mg | ORAL_TABLET | Freq: Once | ORAL | Status: AC
  Administered 2020-10-13: 10 mg via ORAL

## 2020-10-13 MED ORDER — BORTEZOMIB CHEMO SQ INJECTION 3.5 MG (2.5MG/ML)
1.3000 mg/m2 | Freq: Once | INTRAMUSCULAR | Status: AC
  Administered 2020-10-13: 3 mg via SUBCUTANEOUS
  Filled 2020-10-13: qty 1.2

## 2020-10-13 MED ORDER — DIPHENHYDRAMINE HCL 25 MG PO CAPS
50.0000 mg | ORAL_CAPSULE | Freq: Once | ORAL | Status: AC
  Administered 2020-10-13: 50 mg via ORAL

## 2020-10-13 MED ORDER — SODIUM CHLORIDE 0.9 % IV SOLN
300.0000 mg/m2 | Freq: Once | INTRAVENOUS | Status: AC
  Administered 2020-10-13: 680 mg via INTRAVENOUS
  Filled 2020-10-13: qty 34

## 2020-10-13 MED ORDER — DARATUMUMAB-HYALURONIDASE-FIHJ 1800-30000 MG-UT/15ML ~~LOC~~ SOLN
1800.0000 mg | Freq: Once | SUBCUTANEOUS | Status: AC
  Administered 2020-10-13: 1800 mg via SUBCUTANEOUS
  Filled 2020-10-13: qty 15

## 2020-10-13 MED ORDER — SODIUM CHLORIDE 0.9 % IV SOLN
Freq: Once | INTRAVENOUS | Status: AC
  Filled 2020-10-13: qty 250

## 2020-10-13 MED ORDER — DIPHENHYDRAMINE HCL 25 MG PO CAPS
ORAL_CAPSULE | ORAL | Status: AC
  Filled 2020-10-13: qty 1

## 2020-10-13 MED ORDER — ACETAMINOPHEN 325 MG PO TABS
650.0000 mg | ORAL_TABLET | Freq: Once | ORAL | Status: AC
  Administered 2020-10-13: 650 mg via ORAL

## 2020-10-13 MED ORDER — DEXAMETHASONE 4 MG PO TABS
ORAL_TABLET | ORAL | Status: AC
  Filled 2020-10-13: qty 10

## 2020-10-13 MED ORDER — DIPHENHYDRAMINE HCL 25 MG PO CAPS
ORAL_CAPSULE | ORAL | Status: AC
  Filled 2020-10-13: qty 2

## 2020-10-13 MED ORDER — DEXAMETHASONE 4 MG PO TABS
40.0000 mg | ORAL_TABLET | Freq: Once | ORAL | Status: AC
  Administered 2020-10-13: 40 mg via ORAL

## 2020-10-13 NOTE — Progress Notes (Signed)
Per Dr. Lorenso Courier, ok for treatment today with elevated heart rate. Pt. denies complaints of chest pain, dizziness, and no shortness of breath noted. Per Dr. Lorenso Courier, pt. does not need to stay for post observation today as he tolerated first two treatments without difficulty. Pt. stable for discharge, left via ambulation, no respiratory distress noted.

## 2020-10-13 NOTE — Patient Instructions (Signed)
[facility] Discharge Instructions for Patients Receiving Chemotherapy  Today you received the following chemotherapy agents: Cyclophosphamide (Cytoxan), Bortezomib (Velcade), Daratumumab (Darzalex Faspro).  To help prevent nausea and vomiting after your treatment, we encourage you to take your nausea medication as directed by your MD.   If you develop nausea and vomiting that is not controlled by your nausea medication, call the clinic.   BELOW ARE SYMPTOMS THAT SHOULD BE REPORTED IMMEDIATELY:  *FEVER GREATER THAN 100.5 F  *CHILLS WITH OR WITHOUT FEVER  NAUSEA AND VOMITING THAT IS NOT CONTROLLED WITH YOUR NAUSEA MEDICATION  *UNUSUAL SHORTNESS OF BREATH  *UNUSUAL BRUISING OR BLEEDING  TENDERNESS IN MOUTH AND THROAT WITH OR WITHOUT PRESENCE OF ULCERS  *URINARY PROBLEMS  *BOWEL PROBLEMS  UNUSUAL RASH Items with * indicate a potential emergency and should be followed up as soon as possible.  Feel free to call the clinic should you have any questions or concerns. The clinic phone number is [phone].  Please show the [facility] at check-in to the Emergency Department and triage nurse.

## 2020-10-13 NOTE — Progress Notes (Signed)
[place] [phone]   [phone]  PROGRESS NOTE  Patient Care Team: Isaac Bliss, Rayford Halsted, MD as PCP - General (Internal Medicine)  Hematological/Oncological History # AL Amyloidosis of the Kidney 1) 08/03/2020: Free kappa 19.5, Lambda 205.7, Kappa/Lambda ratio: 0.09. M protein undetectable 2) 08/07/2020: kidney biopsy performed, results consistent with lambda light chain AL amyloidosis 3) 08/23/2020: establish care with Dr. Lorenso Courier  4) 09/01/2020: bone marrow biopsy shows plasma cell neoplasm with focal amyloid deposits 5) 09/29/2020: anticipated Cycle 1 Day 1 of Dara-CyBorD  Interval History:  Samuel Butler 59 y.o. male with medical history significant for AL amyloidosis of the kidney presents for a follow up visit. The patient's last visit was on 09/28/2020 at which time he started treatment. In the interim since his last visit he has started Cycle 1 of Dara-CyBorD.   On exam today Samuel Butler is accompanied by his wife.  He reports that he has tolerated his first 2 treatments relatively well.  He has developed what appears to be a rash and some soreness at the site of injection and was recently seen by Sandi Mealy and symptom management and received antibiotic therapy for this on concern that this may have represented cellulitis.  The patient notes that he has not been having any issues with nausea, vomiting, or diarrhea.  On the contrary has had some constipation and when he tried to pass a bowel movement this morning he reports that he had "a lot of blood" mostly on the tissue and some in the toilet bowl.  He reports that his urine has been less dark but continues to bubble.  His appetite has been poor as food does not maintain his normal taste, though his weight is only down 1 pound from his last visit.  He currently denies any fevers, chills, sweats.  A full 10 point ROS is listed below.  MEDICAL HISTORY:  Past Medical History:  Diagnosis Date   . DDD (degenerative disc disease), lumbar    per his report  . Diabetes mellitus without complication ([place])   . Gout 04/16/2013  . Hyperlipidemia   . Hypertension   . IBD (inflammatory bowel disease) 04/05/2016  . Tobacco use disorder 03/28/2015    SURGICAL HISTORY: Past Surgical History:  Procedure Laterality Date  . ABDOMINAL AORTOGRAM W/LOWER EXTREMITY Right 06/26/2020   Procedure: ABDOMINAL AORTOGRAM W/LOWER EXTREMITY;  Surgeon: Lorretta Harp, MD;  Location: [facility];  Service: Cardiovascular;  Laterality: Right;  . COLONOSCOPY    . PERIPHERAL VASCULAR INTERVENTION  06/26/2020   Procedure: PERIPHERAL VASCULAR INTERVENTION;  Surgeon: Lorretta Harp, MD;  Location: [facility];  Service: Cardiovascular;;  Right SFA  . TONSILLECTOMY      SOCIAL HISTORY: Social History   Socioeconomic History  . Marital status: Married    Spouse name: Not on file  . Number of children: Not on file  . Years of education: Not on file  . Highest education level: Not on file  Occupational History  . Not on file  Tobacco Use  . Smoking status: Former Smoker    Types: Cigarettes    Quit date: 10/23/2016    Years since quitting: 3.9  . Smokeless tobacco: Never Used  . Tobacco comment: per patient 5 cigarettes a day   Vaping Use  . Vaping Use: Never used  Substance and Sexual Activity  . Alcohol use: Yes    Comment: occasional  . Drug use: No  . Sexual  activity: Not on file  Other Topics Concern  . 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 Difficulty of Paying Living Expenses: Not on file  Food Insecurity:   . Worried About Charity fundraiser in the Last Year: Not on file  . Ran Out of Food in the Last Year: Not on  file  Transportation Needs:   . Lack of Transportation (Medical): Not on file  . Lack of Transportation (Non-Medical): Not on file  Physical Activity:   . Days of Exercise per Week: Not on file  . Minutes of Exercise per Session: Not on file  Stress:   . Feeling of Stress : Not on file  Social Connections:   . Frequency of Communication with Friends and Family: Not on file  . Frequency of Social Gatherings with Friends and Family: Not on file  . Attends Religious Services: Not on file  . Active Member of Clubs or Organizations: Not on file  . Attends Archivist Meetings: Not on file  . Marital Status: Not on file  Intimate Partner Violence:   . Fear of Current or Ex-Partner: Not on file  . Emotionally Abused: Not on file  . Physically Abused: Not on file  . Sexually Abused: Not on file    FAMILY HISTORY: Family History  Problem Relation Age of Onset  . Hypertension Father   . Diabetes Father   . Stroke Father     ALLERGIES:  is allergic to crestor [rosuvastatin calcium].  MEDICATIONS:  Current Outpatient Medications  Medication Sig Dispense Refill  . acetaminophen (TYLENOL) 650 MG CR tablet Take 650 mg by mouth every 8 (eight) hours as needed for pain.    Marland Kitchen acyclovir (ZOVIRAX) 400 MG tablet Take 1 tablet (400 mg total) by mouth 2 (two) times daily. 60 tablet 3  . albuterol (VENTOLIN HFA) 108 (90 Base) MCG/ACT inhaler Inhale 2 puffs into the lungs every 6 (six) hours as needed for wheezing or shortness of breath. 8 g 0  . aspirin EC 81 MG EC tablet Take 1 tablet (81 mg total) by mouth daily. Swallow whole. 30 tablet 11  . atorvastatin (LIPITOR) 80 MG tablet Take 1 tablet (80 mg total) by mouth daily. 90 tablet 2  . cephALEXin (KEFLEX) 500 MG capsule Take 1 capsule (500 mg total) by mouth 3 (three) times daily. 21 capsule 0  . clopidogrel (PLAVIX) 75 MG tablet Take 1 tablet (75 mg total) by mouth daily with breakfast. 90 tablet 3  . ergocalciferol (VITAMIN D2) 1.25  MG (50000 UT) capsule Take 1 capsule (50,000 Units total) by mouth once a week. 12 capsule 0  . isosorbide mononitrate (IMDUR) 30 MG 24 hr tablet Take 1 tablet (30 mg total) by mouth daily. 90 tablet 1  . losartan (COZAAR) 50 MG tablet Take 1 tablet (50 mg total) by mouth daily. 90 tablet 3  . metFORMIN (GLUCOPHAGE) 500 MG tablet Take 1 tablet (500 mg total) by mouth daily with breakfast. 90 tablet 1  . metoprolol tartrate (LOPRESSOR) 50 MG tablet Take 2 hours prior to procedure. 2 tablet 0  . ondansetron (ZOFRAN) 8 MG tablet  Take 1 tablet (8 mg total) by mouth every 8 (eight) hours as needed for nausea or vomiting. 30 tablet 0  . pantoprazole (PROTONIX) 40 MG tablet Take 1 tablet (40 mg total) by mouth daily. 90 tablet 1  . potassium chloride SA (KLOR-CON) 20 MEQ tablet Take 1 tablet by mouth twice daily 28 tablet 0  . torsemide (DEMADEX) 20 MG tablet Take 20 mg by mouth 2 (two) times daily.    . VEMLIDY 25 MG TABS Take 1 tablet by mouth daily.     No current facility-administered medications for this visit.   Facility-Administered Medications Ordered in Other Visits  Medication Dose Route Frequency Provider Last Rate Last Admin  . acetaminophen (TYLENOL) tablet 650 mg  650 mg Oral Once Ledell Peoples IV, MD      . bortezomib SQ (VELCADE) chemo injection (2.45m/mL concentration) 3 mg  1.3 mg/m2 (Treatment Plan Recorded) Subcutaneous Once DOrson Slick MD      . cyclophosphamide (CYTOXAN) 680 mg in sodium chloride 0.9 % 250 mL chemo infusion  300 mg/m2 (Treatment Plan Recorded) Intravenous Once DOrson Slick MD      . daratumumab-hyaluronidase-fihj ([facility] P CFASPRO) 1800-30000 MG-UT/15ML chemo SQ injection 1,800 mg  1,800 mg Subcutaneous Once DLedell PeoplesIV, MD      . dexamethasone (DECADRON) tablet 40 mg  40 mg Oral Once DOrson Slick MD      . diphenhydrAMINE (BENADRYL) capsule 50 mg  50 mg Oral Once DOrson Slick MD      . montelukast (SINGULAIR) tablet 10 mg  10 mg  Oral Once DOrson Slick MD        REVIEW OF SYSTEMS:   Constitutional: ( - ) fevers, ( - )  chills , ( - ) night sweats Eyes: ( - ) blurriness of vision, ( - ) double vision, ( - ) watery eyes Ears, nose, mouth, throat, and face: ( - ) mucositis, ( - ) sore throat Respiratory: ( - ) cough, ( - ) dyspnea, ( - ) wheezes Cardiovascular: ( - ) palpitation, ( - ) chest discomfort, ( - ) lower extremity swelling Gastrointestinal:  ( - ) nausea, ( - ) heartburn, ( - ) change in bowel habits Skin: ( - ) abnormal skin rashes Lymphatics: ( - ) new lymphadenopathy, ( - ) easy bruising Neurological: ( - ) numbness, ( - ) tingling, ( - ) new weaknesses Behavioral/Psych: ( - ) mood change, ( - ) new changes  All other systems were reviewed with the patient and are negative.  PHYSICAL EXAMINATION: ECOG PERFORMANCE STATUS: 1 - Symptomatic but completely ambulatory  Vitals:   10/13/20 0856  BP: (!) 130/95  Pulse: (!) 102  Resp: 18  Temp: (!) 96.9 F (36.1 C)  SpO2: 99%   Filed Weights   10/13/20 0856  Weight: 239 lb 11.2 oz (108.7 kg)    GENERAL: well appearing middle aged [place] male alert, no distress and comfortable SKIN: skin color, texture, turgor are normal, no rashes or significant lesions EYES: conjunctiva are pink and non-injected, sclera clear LUNGS: clear to auscultation and percussion with normal breathing effort HEART: regular rate & rhythm and no murmurs and no lower extremity edema Musculoskeletal: no cyanosis of digits and no clubbing  PSYCH: alert & oriented x 3, fluent speech NEURO: no focal motor/sensory deficits  LABORATORY DATA:  I have reviewed the data as listed CBC Latest Ref Rng & Units 10/13/2020  10/06/2020 09/28/2020  WBC 4.0 - 10.5 K/uL 3.9(L) 5.3 6.1  Hemoglobin 13.0 - 17.0 g/dL 13.2 13.1 12.7(L)  Hematocrit 39 - 52 % 40.4 39.8 38.6(L)  Platelets 150 - 400 K/uL 163 193 180    CMP Latest Ref Rng & Units 10/13/2020 10/06/2020 09/28/2020   Glucose 70 - 99 mg/dL 111(H) 114(H) 95  BUN 6 - 20 mg/dL 19 17 15   Creatinine 0.61 - 1.24 mg/dL 1.09 1.22 1.14  Sodium 135 - 145 mmol/L 141 140 143  Potassium 3.5 - 5.1 mmol/L 3.4(L) 3.5 3.7  Chloride 98 - 111 mmol/L 104 102 105  CO2 22 - 32 mmol/L 30 31 33(H)  Calcium 8.9 - 10.3 mg/dL 7.8(L) 8.0(L) 7.9(L)  Total Protein 6.5 - 8.1 g/dL 4.3(L) 4.6(L) 4.5(L)  Total Bilirubin 0.3 - 1.2 mg/dL 0.6 0.7 0.6  Alkaline Phos 38 - 126 U/L 80 92 88  AST 15 - 41 U/L 16 18 17   ALT 0 - 44 U/L 15 18 9     Lab Results  Component Value Date   MPROTEIN Not Observed 08/23/2020   Lab Results  Component Value Date   KPAFRELGTCHN 18.5 08/23/2020   KPAFRELGTCHN 18.0 08/04/2020   LAMBDASER 199.1 (H) 08/23/2020   LAMBDASER 192.5 (H) 08/04/2020   KAPLAMBRATIO 0.11 (L) 08/25/2020   KAPLAMBRATIO 0.09 (L) 08/23/2020   KAPLAMBRATIO 0.09 (L) 08/04/2020     RADIOGRAPHIC STUDIES: VAS [place] GROIN PSEUDOANEURYSM  Result Date: 09/13/2020  ARTERIAL PSEUDOANEURYSM  Exam: Right groin, rule out pseudoaneurysm. Indications: Patient complains of groin pain and like something was pulling/sliding in and out ever since his arterial procedure in August 2021. He says it has since "popped" and was thought to be an inguinal hernia. Performing Technologist: Mariane Masters RVT  Examination Guidelines: A complete evaluation includes B-mode imaging, spectral Doppler, color Doppler, and power Doppler as needed of all accessible portions of each vessel. Bilateral testing is considered an integral part of a complete examination. Limited examinations for reoccurring indications may be performed as noted. +------------+----------+---------+------+------------------------------+ Right DuplexPSV (cm/s)Waveform Plaque          Comment(s)           +------------+----------+---------+------+------------------------------+ CFA             66    triphasicNormal                                +------------+----------+---------+------+------------------------------+ PFA             65    triphasicNormal                               +------------+----------+---------+------+------------------------------+ Prox SFA        64    triphasicNormalminimal plaque/wall thickening +------------+----------+---------+------+------------------------------+ Right Vein comments:The common femoral vein and proximal femoral vein demonstrate normal respirophasic flow, no evidence of thrombosis.  Summary: No evidence of pseudoaneurysm, AVF or DVT  Diagnosing physician: Jenkins Rouge MD Electronically signed by Jenkins Rouge MD on 09/13/2020 at 6:06:24 PM.   --------------------------------------------------------------------------------    Final     ASSESSMENT & PLAN Samuel Butler 59 y.o. male with medical history significant for AL amyloidosis of the kidney presents for a follow up visit.  After review the labs, the records, discussion with the patient the findings are most consistent with AL amyloidosis affecting the kidney.  As such I would recommended we start treatment with [facility]  CyBorD.  After reviewing the biopsy results his findings are most consistent with AL amyloidosis. As such the treatment of choice would be to target his plasma cell population with a triplet or quadruplet therapy. Therapy of choice in this case would consist of daratumumab, Velcade, cyclophosphamide, and dexamethasone. Given the patient's good functional status we will start with full dose Dara-CyBorD. I previously discussed the side effects of this chemotherapy with the patient including neuropathy, elevated blood pressure, drop in blood counts, hypersensitivity reaction, chest tightness, increased infection risk, and fatigue. The patient and family voiced their understanding of these findings and are agreeable to moving forward with quadruple therapy with the goal of being a bridge to bone marrow transplant.   The  regimen of choice will be daratumumab, bortezomib, cyclophosphamide and dexamethasone per the ANDROMEDA Study ( Blood. 2020 Jul 2;136(1):71-80). Treatment consists of: Cyclophosphamide 300 mg/m2 intravenously and bortezomib 1.3 mg/m2 subcutaneously were given on days 1, 8, 15, and 22 of each 28 day cycle for up to 6 cycles. Dexamethasone 40 mg (starting dose) was given orally or intravenously weekly for each cycle for up to 6 cycles. DARA Pyatt was administered in a single, premixed vial and given by manual Wilcox injection over the course of 3 to 5 minutes weekly in cycles 1 to 2, every 2 weeks in cycles 3 to 6, and every 4 weeks thereafter as monotherapy for a maximum of 2 years. We would only consider this continued dosing regimen if patient declined or was determined not to be a candidate for BMT.   On exam today Samuel Butler notes he is tolerating treatment well overall save the discoloration on his abdomen.  He did have an episode of bright red blood per rectum due to straining with a bowel movement.  His hemoglobin is stable and otherwise his labs are acceptable for proceeding with treatment today.  #AL Amyloidosis Affecting the Kidney --bone marrow biopsy and kidney biopsy helped to confirm the diagnosis of AL amyloidosis. --currently being treated with  Dara-CyBorD (per the [facility] Trial), started Cycle 1 Day 1 on 09/29/2020.  --will monitoring monthly SPEP, UPEP, and SFLC while on treatment. --will have patient return to clinic for weekly treatment with q 2 week clinic visits initially.   #Injection Site Rash, improving --appears benign on exam today  --continue antibiotic therapy  #Supportive Care --chemotherapy education performed --zofran 33m q8H PRN and compazine 177mPO q6H for nausea --acyclovir 40050mO BID for VCZ prophylaxis --albuterol HFA inhaler for daratumumab treatment -- EMLA cream for port not required, patient declines port at this time, but will reconsider once treatment  starts -- no pain medication required at this time.   No orders of the defined types were placed in this encounter.   All questions were answered. The patient knows to call the clinic with any problems, questions or concerns.  A total of more than 30 minutes were spent on this encounter and over half of that time was spent on counseling and coordination of care as outlined above.   JohLedell PeoplesD Department of Hematology/Oncology [facility] LLCone: [phone]: [phone]: [email or web address]  10/13/2020 10:15 AM   Literature Support:  V, Clarita Craneasey SY, Weiss BM, VerRush Landmarkerlini G, Comenzo RL. Daratumumab plus CyBorD for patients with newly diagnosed AL amyloidosis: safety run-in results of ANDROMEDA. Blood. 2020 Jul 2;136(1):71-80.  --Daratumumab-CyBorD was well tolerated, with no new safety concerns versus the intravenous formulation, and demonstrated  robust hematologic and organ responses.

## 2020-10-16 LAB — MULTIPLE MYELOMA PANEL, SERUM
Albumin SerPl Elph-Mcnc: 1.6 g/dL — ABNORMAL LOW (ref 2.9–4.4)
Albumin/Glob SerPl: 0.7 (ref 0.7–1.7)
Alpha 1: 0.2 g/dL (ref 0.0–0.4)
Alpha2 Glob SerPl Elph-Mcnc: 1.2 g/dL — ABNORMAL HIGH (ref 0.4–1.0)
B-Globulin SerPl Elph-Mcnc: 0.8 g/dL (ref 0.7–1.3)
Gamma Glob SerPl Elph-Mcnc: 0.2 g/dL — ABNORMAL LOW (ref 0.4–1.8)
Globulin, Total: 2.3 g/dL (ref 2.2–3.9)
IgA: 51 mg/dL — ABNORMAL LOW (ref 90–386)
IgG (Immunoglobin G), Serum: 222 mg/dL — ABNORMAL LOW (ref 603–1613)
IgM (Immunoglobulin M), Srm: 29 mg/dL (ref 20–172)
M Protein SerPl Elph-Mcnc: 0.1 g/dL — ABNORMAL HIGH
Total Protein ELP: 3.9 g/dL — ABNORMAL LOW (ref 6.0–8.5)

## 2020-10-16 LAB — KAPPA/LAMBDA LIGHT CHAINS
Kappa free light chain: 10 mg/L (ref 3.3–19.4)
Kappa, lambda light chain ratio: 0.18 — ABNORMAL LOW (ref 0.26–1.65)
Lambda free light chains: 56.1 mg/L — ABNORMAL HIGH (ref 5.7–26.3)

## 2020-10-17 ENCOUNTER — Telehealth (HOSPITAL_COMMUNITY): Payer: Self-pay | Admitting: Emergency Medicine

## 2020-10-17 NOTE — Telephone Encounter (Signed)
Reaching out to patient to offer assistance regarding upcoming cardiac imaging study; pt verbalizes understanding of appt date/time, parking situation and where to check in, pre-test NPO status and medications ordered, and verified current allergies; name and call back number provided for further questions should they arise Marchia Bond RN [facility] [phone] office [phone] cell  50mg  metoprolol tartrate 2 hr prior to scan, avoid torsemide - pt verbalized understanding Shamar Kracke

## 2020-10-18 ENCOUNTER — Encounter: Payer: Self-pay | Admitting: Hematology and Oncology

## 2020-10-18 ENCOUNTER — Encounter (HOSPITAL_COMMUNITY): Payer: Self-pay | Admitting: *Deleted

## 2020-10-18 ENCOUNTER — Observation Stay (HOSPITAL_COMMUNITY)
Admission: RE | Admit: 2020-10-18 | Discharge: 2020-10-18 | Disposition: A | Discharge status: Home / Self Care | Payer: BC Managed Care – PPO | Source: Ambulatory Visit | Attending: Cardiovascular Disease | Admitting: Cardiovascular Disease

## 2020-10-18 ENCOUNTER — Inpatient Hospital Stay (HOSPITAL_COMMUNITY)
Admission: EM | Admit: 2020-10-18 | Discharge: 2020-10-20 | DRG: 176 | Disposition: A | Discharge status: Home / Self Care | Payer: BC Managed Care – PPO | Attending: Internal Medicine | Admitting: Internal Medicine

## 2020-10-18 ENCOUNTER — Other Ambulatory Visit: Payer: Self-pay

## 2020-10-18 ENCOUNTER — Ambulatory Visit (HOSPITAL_COMMUNITY)
Admission: RE | Admit: 2020-10-18 | Discharge: 2020-10-18 | Disposition: A | Discharge status: Home / Self Care | Payer: BC Managed Care – PPO | Source: Ambulatory Visit | Attending: Cardiovascular Disease | Admitting: Cardiovascular Disease

## 2020-10-18 ENCOUNTER — Telehealth: Payer: Self-pay

## 2020-10-18 DIAGNOSIS — I2510 Atherosclerotic heart disease of native coronary artery without angina pectoris: Secondary | ICD-10-CM | POA: Diagnosis present

## 2020-10-18 DIAGNOSIS — E785 Hyperlipidemia, unspecified: Secondary | ICD-10-CM | POA: Diagnosis present

## 2020-10-18 DIAGNOSIS — I739 Peripheral vascular disease, unspecified: Secondary | ICD-10-CM | POA: Diagnosis present

## 2020-10-18 DIAGNOSIS — R778 Other specified abnormalities of plasma proteins: Secondary | ICD-10-CM | POA: Diagnosis present

## 2020-10-18 DIAGNOSIS — I2699 Other pulmonary embolism without acute cor pulmonale: Principal | ICD-10-CM | POA: Diagnosis present

## 2020-10-18 DIAGNOSIS — I313 Pericardial effusion (noninflammatory): Secondary | ICD-10-CM | POA: Diagnosis present

## 2020-10-18 DIAGNOSIS — Z79899 Other long term (current) drug therapy: Secondary | ICD-10-CM

## 2020-10-18 DIAGNOSIS — Z87891 Personal history of nicotine dependence: Secondary | ICD-10-CM

## 2020-10-18 DIAGNOSIS — Z7984 Long term (current) use of oral hypoglycemic drugs: Secondary | ICD-10-CM

## 2020-10-18 DIAGNOSIS — Z8249 Family history of ischemic heart disease and other diseases of the circulatory system: Secondary | ICD-10-CM

## 2020-10-18 DIAGNOSIS — E1121 Type 2 diabetes mellitus with diabetic nephropathy: Secondary | ICD-10-CM | POA: Diagnosis present

## 2020-10-18 DIAGNOSIS — Z86711 Personal history of pulmonary embolism: Secondary | ICD-10-CM

## 2020-10-18 DIAGNOSIS — Z7982 Long term (current) use of aspirin: Secondary | ICD-10-CM

## 2020-10-18 DIAGNOSIS — E859 Amyloidosis, unspecified: Secondary | ICD-10-CM | POA: Diagnosis present

## 2020-10-18 DIAGNOSIS — E8581 Light chain (AL) amyloidosis: Secondary | ICD-10-CM | POA: Diagnosis present

## 2020-10-18 DIAGNOSIS — Z7902 Long term (current) use of antithrombotics/antiplatelets: Secondary | ICD-10-CM

## 2020-10-18 DIAGNOSIS — Z20822 Contact with and (suspected) exposure to covid-19: Secondary | ICD-10-CM | POA: Diagnosis present

## 2020-10-18 DIAGNOSIS — Z87441 Personal history of nephrotic syndrome: Secondary | ICD-10-CM

## 2020-10-18 DIAGNOSIS — Z823 Family history of stroke: Secondary | ICD-10-CM

## 2020-10-18 DIAGNOSIS — Z888 Allergy status to other drugs, medicaments and biological substances status: Secondary | ICD-10-CM

## 2020-10-18 DIAGNOSIS — N049 Nephrotic syndrome with unspecified morphologic changes: Secondary | ICD-10-CM

## 2020-10-18 DIAGNOSIS — E1151 Type 2 diabetes mellitus with diabetic peripheral angiopathy without gangrene: Secondary | ICD-10-CM | POA: Diagnosis present

## 2020-10-18 DIAGNOSIS — R079 Chest pain, unspecified: Secondary | ICD-10-CM

## 2020-10-18 DIAGNOSIS — Z833 Family history of diabetes mellitus: Secondary | ICD-10-CM

## 2020-10-18 DIAGNOSIS — E119 Type 2 diabetes mellitus without complications: Secondary | ICD-10-CM

## 2020-10-18 DIAGNOSIS — I351 Nonrheumatic aortic (valve) insufficiency: Secondary | ICD-10-CM | POA: Diagnosis present

## 2020-10-18 DIAGNOSIS — I10 Essential (primary) hypertension: Secondary | ICD-10-CM | POA: Diagnosis present

## 2020-10-18 DIAGNOSIS — M5136 Other intervertebral disc degeneration, lumbar region: Secondary | ICD-10-CM | POA: Diagnosis present

## 2020-10-18 DIAGNOSIS — B181 Chronic viral hepatitis B without delta-agent: Secondary | ICD-10-CM | POA: Diagnosis present

## 2020-10-18 LAB — CBC WITH DIFFERENTIAL/PLATELET
Abs Immature Granulocytes: 0.02 10*3/uL (ref 0.00–0.07)
Basophils Absolute: 0 10*3/uL (ref 0.0–0.1)
Basophils Relative: 0 %
Eosinophils Absolute: 0 10*3/uL (ref 0.0–0.5)
Eosinophils Relative: 0 %
HCT: 43.1 % (ref 39.0–52.0)
Hemoglobin: 13.6 g/dL (ref 13.0–17.0)
Immature Granulocytes: 0 %
Lymphocytes Relative: 15 %
Lymphs Abs: 1 10*3/uL (ref 0.7–4.0)
MCH: 30.8 pg (ref 26.0–34.0)
MCHC: 31.6 g/dL (ref 30.0–36.0)
MCV: 97.7 fL (ref 80.0–100.0)
Monocytes Absolute: 0.5 10*3/uL (ref 0.1–1.0)
Monocytes Relative: 7 %
Neutro Abs: 5.2 10*3/uL (ref 1.7–7.7)
Neutrophils Relative %: 78 %
Platelets: 120 10*3/uL — ABNORMAL LOW (ref 150–400)
RBC: 4.41 MIL/uL (ref 4.22–5.81)
RDW: 15.4 % (ref 11.5–15.5)
WBC: 6.7 10*3/uL (ref 4.0–10.5)
nRBC: 0 % (ref 0.0–0.2)

## 2020-10-18 LAB — RESP PANEL BY RT-PCR (FLU A&B, COVID) ARPGX2
Influenza A by PCR: NEGATIVE
Influenza B by PCR: NEGATIVE
SARS Coronavirus 2 by RT PCR: NEGATIVE

## 2020-10-18 LAB — UPEP/UIFE/LIGHT CHAINS/TP, 24-HR UR
% BETA, Urine: 7.8 %
ALPHA 1 URINE: 1.2 %
Albumin, U: 85.9 %
Alpha 2, Urine: 3.1 %
Free Kappa Lt Chains,Ur: 14.08 mg/L (ref 0.63–113.79)
Free Kappa/Lambda Ratio: 0.36 — ABNORMAL LOW (ref 1.03–31.76)
Free Lambda Lt Chains,Ur: 38.68 mg/L — ABNORMAL HIGH (ref 0.47–11.77)
GAMMA GLOBULIN URINE: 2 %
Total Protein, Urine-Ur/day: 4339 mg/24 hr — ABNORMAL HIGH (ref 30–150)
Total Protein, Urine: 413.2 mg/dL
Total Volume: 1050

## 2020-10-18 LAB — BASIC METABOLIC PANEL
Anion gap: 13 (ref 5–15)
BUN: 24 mg/dL — ABNORMAL HIGH (ref 6–20)
CO2: 23 mmol/L (ref 22–32)
Calcium: 7.9 mg/dL — ABNORMAL LOW (ref 8.9–10.3)
Chloride: 104 mmol/L (ref 98–111)
Creatinine, Ser: 1.29 mg/dL — ABNORMAL HIGH (ref 0.61–1.24)
GFR, Estimated: >60 mL/min (ref >60)
Glucose, Bld: 82 mg/dL (ref 70–99)
Potassium: 4.3 mmol/L (ref 3.5–5.1)
Sodium: 140 mmol/L (ref 135–145)

## 2020-10-18 LAB — APTT: aPTT: 26 seconds (ref 24–36)

## 2020-10-18 LAB — TROPONIN I (HIGH SENSITIVITY)
Troponin I (High Sensitivity): 126 ng/L (ref <18)
Troponin I (High Sensitivity): 134 ng/L (ref <18)
Troponin I (High Sensitivity): 110 ng/L (ref <18)

## 2020-10-18 LAB — PROTIME-INR
INR: 1 (ref 0.8–1.2)
Prothrombin Time: 12.7 seconds (ref 11.4–15.2)

## 2020-10-18 LAB — HEPARIN LEVEL (UNFRACTIONATED): Heparin Unfractionated: <0.1 IU/mL — ABNORMAL LOW (ref 0.30–0.70)

## 2020-10-18 LAB — GLUCOSE, CAPILLARY: Glucose-Capillary: 125 mg/dL — ABNORMAL HIGH (ref 70–99)

## 2020-10-18 LAB — BRAIN NATRIURETIC PEPTIDE: B Natriuretic Peptide: 638.2 pg/mL — ABNORMAL HIGH (ref 0.0–100.0)

## 2020-10-18 MED ORDER — IOHEXOL 350 MG/ML SOLN
80.0000 mL | Freq: Once | INTRAVENOUS | Status: AC | PRN
  Administered 2020-10-18: 80 mL via INTRAVENOUS

## 2020-10-18 MED ORDER — HEPARIN (PORCINE) 25000 UT/250ML-% IV SOLN
1900.0000 [IU]/h | INTRAVENOUS | Status: DC
  Administered 2020-10-18 – 2020-10-19 (×3): 1800 [IU]/h via INTRAVENOUS
  Filled 2020-10-18 (×4): qty 250

## 2020-10-18 MED ORDER — METOPROLOL TARTRATE 5 MG/5ML IV SOLN
INTRAVENOUS | Status: AC
  Filled 2020-10-18: qty 20

## 2020-10-18 MED ORDER — NITROGLYCERIN 0.4 MG SL SUBL
0.8000 mg | SUBLINGUAL_TABLET | Freq: Once | SUBLINGUAL | Status: AC
  Administered 2020-10-18: 0.8 mg via SUBLINGUAL

## 2020-10-18 MED ORDER — INSULIN ASPART 100 UNIT/ML ~~LOC~~ SOLN: 0.0000 [IU] | Freq: Three times a day (TID) | SUBCUTANEOUS | Status: DC

## 2020-10-18 MED ORDER — DILTIAZEM HCL 25 MG/5ML IV SOLN
INTRAVENOUS | Status: AC
  Filled 2020-10-18: qty 5

## 2020-10-18 MED ORDER — NITROGLYCERIN 0.4 MG SL SUBL
SUBLINGUAL_TABLET | SUBLINGUAL | Status: AC
  Filled 2020-10-18: qty 2

## 2020-10-18 MED ORDER — DILTIAZEM HCL 25 MG/5ML IV SOLN
5.0000 mg | Freq: Once | INTRAVENOUS | Status: AC
  Administered 2020-10-18: 5 mg via INTRAVENOUS

## 2020-10-18 MED ORDER — METOPROLOL TARTRATE 5 MG/5ML IV SOLN
5.0000 mg | INTRAVENOUS | Status: DC | PRN
  Administered 2020-10-18 (×4): 5 mg via INTRAVENOUS

## 2020-10-18 MED ORDER — ACETAMINOPHEN 650 MG RE SUPP: 650.0000 mg | Freq: Four times a day (QID) | RECTAL | Status: DC | PRN

## 2020-10-18 MED ORDER — HEPARIN BOLUS VIA INFUSION
6000.0000 [IU] | Freq: Once | INTRAVENOUS | Status: DC
  Filled 2020-10-18: qty 6000

## 2020-10-18 MED ORDER — HEPARIN BOLUS VIA INFUSION
4000.0000 [IU] | Freq: Once | INTRAVENOUS | Status: AC
  Administered 2020-10-18: 4000 [IU] via INTRAVENOUS
  Filled 2020-10-18: qty 4000

## 2020-10-18 MED ORDER — ACETAMINOPHEN 325 MG PO TABS: 650.0000 mg | ORAL_TABLET | Freq: Four times a day (QID) | ORAL | Status: DC | PRN

## 2020-10-18 MED ORDER — DILTIAZEM HCL 25 MG/5ML IV SOLN: 5.0000 mg | Freq: Once | INTRAVENOUS | Status: DC

## 2020-10-18 NOTE — H&P (Addendum)
History and Physical:    Samuel Butler   IHK:[id number] DOB: Aug 31, 1961 DOA: 10/18/2020  Referring MD/provider: PA Walker Shadow PCP: Isaac Bliss, Rayford Halsted, MD   Patient coming from: Home  Chief Complaint: Pulmonary emboli  History of Present Illness:   Samuel Butler is an 59 y.o. male with PMH significant for HTN, DM 2, amyloidosis, nephrotic syndrome, PAD who was incidentally found to have pulmonary emboli on coronary CT done today.  Patient states that he has had a progressive decline in his functioning since he has become "swollen".  He does not seem to have a clear understanding of why he is swelling.  He notes that he has not been able to be very functional due to heaviness of his legs due to edema.  He also has been quite short of breath since August.  He states he is short of breath just taking a shower although this improves when he is not quite so swollen.  Last week patient developed a right-sided chest pain which is associated with lying on his right side and with taking deep breaths in and with cough.  No exertional component.  Patient is not sure whether his baseline shortness of breath has worsened over the last week or not.  He notes that his shortness of breath is limiting enough where he cannot really tell if it is better or worse.  ED Course:  The patient was noted to have fever of 99.2 but otherwise normal vital signs.  Laboratory data were notable only for a BNP of 638.  Patient was started on a heparin drip per PE protocol.  ROS:   ROS   Review of Systems: General: Denies fever, chills, malaise,  Respiratory: Denies hemoptysis Cardiovascular: Denies chest pain or palpitations GI: Denies nausea, vomiting, diarrhea or constipation GU: Denies dysuria, frequency or hematuria   Past Medical History:   Past Medical History:  Diagnosis Date  . DDD (degenerative disc disease), lumbar    per his report  . Diabetes mellitus without complication ([place])   .  Gout 04/16/2013  . Hyperlipidemia   . Hypertension   . IBD (inflammatory bowel disease) 04/05/2016  . Tobacco use disorder 03/28/2015    Past Surgical History:   Past Surgical History:  Procedure Laterality Date  . ABDOMINAL AORTOGRAM W/LOWER EXTREMITY Right 06/26/2020   Procedure: ABDOMINAL AORTOGRAM W/LOWER EXTREMITY;  Surgeon: Lorretta Harp, MD;  Location: [facility];  Service: Cardiovascular;  Laterality: Right;  . COLONOSCOPY    . PERIPHERAL VASCULAR INTERVENTION  06/26/2020   Procedure: PERIPHERAL VASCULAR INTERVENTION;  Surgeon: Lorretta Harp, MD;  Location: [facility];  Service: Cardiovascular;;  Right SFA  . TONSILLECTOMY      Social History:   Social History   Socioeconomic History  . Marital status: Married    Spouse name: Not on file  . Number of children: Not on file  . Years of education: Not on file  . Highest education level: Not on file  Occupational History  . Not on file  Tobacco Use  . Smoking status: Former Smoker    Types: Cigarettes    Quit date: 10/23/2016    Years since quitting: 3.9  . Smokeless tobacco: Never Used  . Tobacco comment: per patient 5 cigarettes a day   Vaping Use  . Vaping Use: Never used  Substance and Sexual Activity  . Alcohol use: Yes    Comment: occasional  . Drug use: No  . Sexual  activity: Not on file  Other Topics Concern  . 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 Difficulty of Paying Living Expenses: Not on file  Food Insecurity:   . Worried About Charity fundraiser in the Last Year: Not on file  . Ran Out of Food in the Last Year: Not on file  Transportation Needs:   . Lack of Transportation (Medical): Not on file  . Lack of Transportation  (Non-Medical): Not on file  Physical Activity:   . Days of Exercise per Week: Not on file  . Minutes of Exercise per Session: Not on file  Stress:   . Feeling of Stress : Not on file  Social Connections:   . Frequency of Communication with Friends and Family: Not on file  . Frequency of Social Gatherings with Friends and Family: Not on file  . Attends Religious Services: Not on file  . Active Member of Clubs or Organizations: Not on file  . Attends Archivist Meetings: Not on file  . Marital Status: Not on file  Intimate Partner Violence:   . Fear of Current or Ex-Partner: Not on file  . Emotionally Abused: Not on file  . Physically Abused: Not on file  . Sexually Abused: Not on file    Allergies   Crestor [rosuvastatin calcium]  Family history:   Family History  Problem Relation Age of Onset  . Hypertension Father   . Diabetes Father   . Stroke Father     Current Medications:   Prior to Admission medications   Medication Sig Start Date End Date Taking? Authorizing Provider  acetaminophen (TYLENOL) 650 MG CR tablet Take 650 mg by mouth every 8 (eight) hours as needed for pain.    [provider]  acyclovir (ZOVIRAX) 400 MG tablet Take 1 tablet (400 mg total) by mouth 2 (two) times daily. 09/28/20   Orson Slick, MD  albuterol (VENTOLIN HFA) 108 (90 Base) MCG/ACT inhaler Inhale 2 puffs into the lungs every 6 (six) hours as needed for wheezing or shortness of breath. 09/28/20   Orson Slick, MD  aspirin EC 81 MG EC tablet Take 1 tablet (81 mg total) by mouth daily. Swallow whole. 06/27/20   Tommie Raymond, NP  atorvastatin (LIPITOR) 80 MG tablet Take 1 tablet (80 mg total) by mouth daily. 08/31/20   Isaac Bliss, Rayford Halsted, MD  cephALEXin (KEFLEX) 500 MG capsule Take 1 capsule (500 mg total) by mouth 3 (three) times daily. 10/04/20   Tanner, Lyndon Code., PA-C  clopidogrel (PLAVIX) 75 MG tablet Take 1 tablet (75 mg total) by mouth daily with  breakfast. 06/27/20   Kathyrn Drown D, NP  ergocalciferol (VITAMIN D2) 1.25 MG (50000 UT) capsule Take 1 capsule (50,000 Units total) by mouth once a week. 08/31/20   Isaac Bliss, Rayford Halsted, MD  isosorbide mononitrate (IMDUR) 30 MG 24 hr tablet Take 1 tablet (30 mg total) by mouth daily. 07/14/20   Isaac Bliss, Rayford Halsted, MD  losartan (COZAAR) 50 MG tablet Take 1 tablet (50 mg total) by mouth daily. 07/25/20   Lorretta Harp, MD  metFORMIN (GLUCOPHAGE) 500 MG tablet Take 1 tablet (500 mg total) by mouth daily with breakfast. 05/25/20   Isaac Bliss, Rayford Halsted, MD  metoprolol tartrate (LOPRESSOR) 50 MG tablet Take 2 hours prior to procedure. 09/26/20   Lorretta Harp, MD  ondansetron (ZOFRAN) 8 MG tablet Take 1 tablet (8 mg total) by mouth every 8 (eight) hours as needed for nausea or vomiting. 09/28/20   Orson Slick, MD  pantoprazole (PROTONIX) 40 MG tablet Take 1 tablet (40 mg total) by mouth daily. 07/14/20   Isaac Bliss, Rayford Halsted, MD  potassium chloride SA (KLOR-CON) 20 MEQ tablet Take 1 tablet by mouth twice daily 10/06/20   Orson Slick, MD  torsemide (DEMADEX) 20 MG tablet Take 20 mg by mouth 2 (two) times daily. 08/19/20   [provider]  VEMLIDY 25 MG TABS Take 1 tablet by mouth daily. 09/25/20   [provider]    Physical Exam:   Vitals:   10/18/20 1536 10/18/20 1537 10/18/20 1643 10/18/20 1744  BP: 132/88  (!) 124/91 (!) 132/93  Pulse: 81  83 79  Resp: 18  18 17   Temp: 99.2 F (37.3 C)     SpO2: 97%  97% 95%  Weight:  108.7 kg    Height:  5\' 11"  (1.803 m)       Physical Exam: Blood pressure (!) 132/93, pulse 79, temperature 99.2 F (37.3 C), resp. rate 17, height 5\' 11"  (1.803 m), weight 108.7 kg, SpO2 95 %. Gen: Pale man lying flat in bed in no acute distress. Eyes: sclera anicteric, conjuctiva are pale bilaterally CVS: S1-S2, regular, no gallops Respiratory:  decreased air entry likely secondary to decreased inspiratory  effort GI: NABS, soft, abdomen is protuberant but not tender, possible ascites LE: 3+ edema bilaterally. Neuro:  grossly nonfocal.  Psych:  mood and affect appropriate to situation.   Data Review:    Labs: Basic Metabolic Panel: Recent Labs  Lab 10/13/20 0816 10/18/20 1537  NA 141 140  K 3.4* 4.3  CL 104 104  CO2 30 23  GLUCOSE 111* 82  BUN 19 24*  CREATININE 1.09 1.29*  CALCIUM 7.8* 7.9*   Liver Function Tests: Recent Labs  Lab 10/13/20 0816  AST 16  ALT 15  ALKPHOS 80  BILITOT 0.6  PROT 4.3*  ALBUMIN 1.5*   No results for input(s): LIPASE, AMYLASE in the last 168 hours. No results for input(s): AMMONIA in the last 168 hours. CBC: Recent Labs  Lab 10/13/20 0816 10/18/20 1537  WBC 3.9* 6.7  NEUTROABS 2.4 5.2  HGB 13.2 13.6  HCT 40.4 43.1  MCV 92.4 97.7  PLT 163 120*   Cardiac Enzymes: No results for input(s): CKTOTAL, CKMB, CKMBINDEX, TROPONINI in the last 168 hours.  BNP (last 3 results) No results for input(s): PROBNP in the last 8760 hours. CBG: No results for input(s): GLUCAP in the last 168 hours.  Urinalysis    Component Value Date/Time   COLORURINE YELLOW 08/04/2020 0402   APPEARANCEUR CLEAR 08/04/2020 0402   LABSPEC 1.025 08/04/2020 0402   PHURINE 6.0 08/04/2020 0402   GLUCOSEU NEGATIVE 08/04/2020 0402   HGBUR MODERATE (A) 08/04/2020 0402   BILIRUBINUR SMALL (A) 08/04/2020 0402   KETONESUR NEGATIVE 08/04/2020 0402   PROTEINUR >300 (A) 08/04/2020 0402   UROBILINOGEN 1.0 04/14/2013 2058   NITRITE NEGATIVE 08/04/2020 0402   LEUKOCYTESUR NEGATIVE 08/04/2020 0402      Radiographic Studies: CT CORONARY MORPH W/CTA COR W/SCORE W/CA W/CM &/  OR WO/CM  Result Date: 10/18/2020 EXAM: OVER-READ INTERPRETATION  CT CHEST The following report is an over-read performed by radiologist Dr. Vinnie Langton of [facility], [place] on 10/18/2020. This over-read does not include interpretation of cardiac or coronary anatomy or pathology. The coronary  calcium score/coronary CTA interpretation by the cardiologist is attached. COMPARISON:  None. FINDINGS: Aortic atherosclerosis with aneurysmal dilatation of the ascending thoracic aorta (4.8 cm in diameter). Small to moderate pericardial effusion. Multiple filling defects within the pulmonary arterial tree bilaterally, compatible with pulmonary embolism. Dilatation of the pulmonic trunk (3.9 cm in diameter). Moderate right-sided pleural effusion lying dependently. Within the visualized portions of the thorax there are no suspicious appearing pulmonary nodules or masses, there is no acute consolidative airspace disease, no left pleural effusion, no pneumothorax and no lymphadenopathy. Visualized portions of the upper abdomen are unremarkable. There are no aggressive appearing lytic or blastic lesions noted in the visualized portions of the skeleton. IMPRESSION: 1. Study is positive for bilateral pulmonary embolism which appear segmental in size. 2. Ground-glass attenuation in the right lower lobe in a distribution of an embolus. Although much of this may be related atelectasis, the possibility of some degree of alveolar hemorrhage from pulmonary infarction is not excluded. There is also a moderate right-sided pleural effusion lying dependently. 3. Small to moderate pericardial effusion. 4. Aortic atherosclerosis with aneurysmal dilatation of the ascending thoracic aorta (4.8 cm in diameter). Ascending thoracic aortic aneurysm. Recommend semi-annual imaging followup by CTA or MRA and referral to cardiothoracic surgery if not already obtained. This recommendation follows 2010 ACCF/AHA/AATS/ACR/ASA/SCA/SCAI/SIR/STS/SVM Guidelines for the Diagnosis and Management of Patients With Thoracic Aortic Disease. Circulation. 2010; 121: I016-P537. Aortic aneurysm NOS (ICD10-I71.9). 5. Dilatation of the pulmonic trunk (3.9 cm in diameter), which may suggest pulmonary arterial hypertension. These results were called by telephone at  the time of interpretation on 10/18/2020 at 1:50 pm to provider [facility], who verbally acknowledged these results. Electronically Signed   By: Vinnie Langton M.D.   On: 10/18/2020 13:51    EKG: Independently reviewed.  Sinus rhythm at 82.  Left axis deviation at -45.  Low voltage in the limb leads.  Small T wave inversions in 2, V4 through V6.  Q waves V1 through V3.   Assessment/Plan:   Principal Problem:   Acute pulmonary embolism (HCC) Active Problems:   Essential hypertension, benign   Non-insulin treated type 2 diabetes mellitus (HCC)   Peripheral arterial disease (HCC)   Nephrotic syndrome   Chronic viral hepatitis B without delta-agent (HCC)   Amyloidosis ([place])  59 year old man with amyloidosis, nephrotic syndrome, hepatitis B, HTN and DM2 presents with incidentally found pulmonary emboli.   Pulmonary emboli Patient is oxygenating well on room air and appears comfortable from a respiratory standpoint CT however is concerning for increased pulmonary artery pressure, moderate pericardial effusion and groundglass opacities possibly concerning for hemorrhage around segmental PE. Patient has been placed on IV heparin per PE protocol I have ordered echocardiogram Blood pressure is okay, no evidence for RV dysfunction at present  PAD Patient may be on both aspirin and Plavix however med rec has not been done Antiplatelet agents can be restarted and may need to be adjusted given initiation of heparin/chronic anticoagulation.  Minimally elevated troponin EKG does have some T wave inversions and notable Q waves in V1 through V3 suggestive of previous CAD Patient denies exertional chest pain just the pleuritic type chest pain on his right lower chest. Will trend every 6 hours x4 Can  consider inpatient cardiology consult if warranted  HTN Medicines have not been reconciled, BP meds can be started once it has been in after echocardiogram is done  DM 2 Hold Metformin and  Farxiga SSI AC at bedtime ordered  Nephrotic syndrome Unclear what diuretic patient is on but it does not seem to be working well Patient will need to be diuresed either as an inpatient or outpatient given significant lower extremity edema.    Other information:   DVT prophylaxis: On full dose IV heparin ordered. Code Status: Full Family Communication: Patient's wife was at bedside throughout Disposition Plan: Home Consults called: None Admission status: Observation  Samuel Butler [facility]  If 7PM-7AM, please contact night-coverage [email or web address] Password [facility] 10/18/2020, 5:48 PM

## 2020-10-18 NOTE — Progress Notes (Signed)
CT scan completed. Tolerated well. D/C home ambulatory with wife, awake and alert. In no distress

## 2020-10-18 NOTE — Progress Notes (Signed)
ANTICOAGULATION CONSULT NOTE - Initial Consult  Pharmacy Consult for heparin Indication: pulmonary embolus  Allergies  Allergen Reactions  . Crestor [Rosuvastatin Calcium] Other (See Comments)    Joint aches    Patient Measurements: Height: 5\' 11"  (180.3 cm) Weight: 108.7 kg (239 lb 10.2 oz) IBW/kg (Calculated) : 75.3 kg   Vital Signs: Temp: 99.2 F (37.3 C) (12/08 1536) BP: 132/88 (12/08 1536) Pulse Rate: 81 (12/08 1536)  Labs: No results for input(s): HGB, HCT, PLT, APTT, LABPROT, INR, HEPARINUNFRC, HEPRLOWMOCWT, CREATININE, CKTOTAL, CKMB, TROPONINIHS in the last 72 hours.  Estimated Creatinine Clearance: 90.4 mL/min (by C-G formula based on SCr of 1.09 mg/dL).   Medical History: Past Medical History:  Diagnosis Date  . DDD (degenerative disc disease), lumbar    per his report  . Diabetes mellitus without complication ([place])   . Gout 04/16/2013  . Hyperlipidemia   . Hypertension   . IBD (inflammatory bowel disease) 04/05/2016  . Tobacco use disorder 03/28/2015    Medications:  (Not in a hospital admission)   Assessment: 47 YOM with a history of amyloidosis of the kidneys on chemotherapy found to have an acute bilateral PE. Pharmacy consulted to start IV heparin. Of note, patient did express some recent bleeding from his rectum after his bowel movement.   H/H wnl. Plt low at 120k.  Goal of Therapy:  Heparin level 0.3-0.7 units/ml Monitor platelets by anticoagulation protocol: Yes   Plan:  -Give smaller Heparin bolus of 4000 units IV given recent mild bleeding episode. Then start IV heparin infusion at 1800 units/hr -F/u 6 hr HL -Monitor daily HL, CBC and s/s of bleeding   Albertina Parr, PharmD., BCPS, BCCCP Clinical Pharmacist Please refer to [facility] for unit-specific pharmacist

## 2020-10-18 NOTE — ED Triage Notes (Signed)
The pt was sent here from the c-t scanner  He was diagnosed with a pe he has had chest pain for 5 months and the pt is being treated for cancer in his kidneys.

## 2020-10-18 NOTE — ED Triage Notes (Signed)
The pt reports that he was given drugs earlier to get his bp and heart rate down

## 2020-10-18 NOTE — ED Provider Notes (Addendum)
[facility] EMERGENCY DEPARTMENT Provider Note   CSN: [id number] Arrival date & time: 10/18/20  1414     History Chief Complaint  Patient presents with  . pulmonary embolism    Samuel Butler is a 59 y.o. male.  HPI 60 year old male with a history of DM type II, amyloidosis currently on chemotherapy, hyperlipidemia, hypertension, nephrotic syndrome presents to the ER from home after being told that he has bilateral PEs.  Patient had a CT of the coronaries done and was noted to have an incidental finding of bilateral pulmonary embolisms.  Patient states that he has had some mild chest pain and shortness of breath which has been worsening over the last few days, but states that overall at baseline he has chest pain and shortness of breath with exertion.  He denies any fevers or chills.  He states he had stent placement in his legs in August of this year.  He is on Plavix.  No prior history of PE.  States that he has chronic lower extremity edema and anasarca, has not noticed any redness or worsening swelling over the last few days .  Received a call from his cardiologist to go to the ER today.    Past Medical History:  Diagnosis Date  . DDD (degenerative disc disease), lumbar    per his report  . Diabetes mellitus without complication ([place])   . Gout 04/16/2013  . Hyperlipidemia   . Hypertension   . IBD (inflammatory bowel disease) 04/05/2016  . Tobacco use disorder 03/28/2015    Patient Active Problem List   Diagnosis Date Noted  . Acute pulmonary embolism ([place]) 10/18/2020  . Groin pain, right 08/31/2020  . Chronic viral hepatitis B without delta-agent ([place]) 08/21/2020  . Amyloidosis ([place]) 08/21/2020  . Anasarca 08/04/2020  . Nephrotic syndrome 08/03/2020  . AKI (acute kidney injury) ([place]) 08/03/2020  . Hypokalemia 06/27/2020  . Claudication in peripheral vascular disease ([place]) 06/26/2020  . Hypertension associated with diabetes ([place]) 05/25/2020  . Peripheral  arterial disease ([place]) 03/08/2020  . Vitamin D deficiency 10/26/2019  . Hypoalbuminemia 10/26/2019  . IBD (inflammatory bowel disease) 04/05/2016  . Tobacco use disorder 03/28/2015  . Non-insulin treated type 2 diabetes mellitus ([place]) 01/25/2014  . Essential hypertension, benign 04/16/2013  . Hyperlipemia 04/16/2013  . Gout 04/16/2013    Past Surgical History:  Procedure Laterality Date  . ABDOMINAL AORTOGRAM W/LOWER EXTREMITY Right 06/26/2020   Procedure: ABDOMINAL AORTOGRAM W/LOWER EXTREMITY;  Surgeon: Lorretta Harp, MD;  Location: [facility];  Service: Cardiovascular;  Laterality: Right;  . COLONOSCOPY    . PERIPHERAL VASCULAR INTERVENTION  06/26/2020   Procedure: PERIPHERAL VASCULAR INTERVENTION;  Surgeon: Lorretta Harp, MD;  Location: [facility];  Service: Cardiovascular;;  Right SFA  . TONSILLECTOMY         Family History  Problem Relation Age of Onset  . Hypertension Father   . Diabetes Father   . Stroke Father     Social History   Tobacco Use  . Smoking status: Former Smoker    Types: Cigarettes    Quit date: 10/23/2016    Years since quitting: 3.9  . Smokeless tobacco: Never Used  . Tobacco comment: per patient 5 cigarettes a day   Vaping Use  . Vaping Use: Never used  Substance Use Topics  . Alcohol use: Yes    Comment: occasional  . Drug use: No    Home Medications Prior to Admission medications  Medication Sig Start Date End Date Taking? Authorizing Provider  acetaminophen (TYLENOL) 650 MG CR tablet Take 650 mg by mouth every 8 (eight) hours as needed for pain.    [provider]  acyclovir (ZOVIRAX) 400 MG tablet Take 1 tablet (400 mg total) by mouth 2 (two) times daily. 09/28/20   Orson Slick, MD  albuterol (VENTOLIN HFA) 108 (90 Base) MCG/ACT inhaler Inhale 2 puffs into the lungs every 6 (six) hours as needed for wheezing or shortness of breath. 09/28/20   Orson Slick, MD  aspirin EC 81 MG EC tablet Take 1  tablet (81 mg total) by mouth daily. Swallow whole. 06/27/20   Tommie Raymond, NP  atorvastatin (LIPITOR) 80 MG tablet Take 1 tablet (80 mg total) by mouth daily. 08/31/20   Isaac Bliss, Rayford Halsted, MD  cephALEXin (KEFLEX) 500 MG capsule Take 1 capsule (500 mg total) by mouth 3 (three) times daily. 10/04/20   Tanner, Lyndon Code., PA-C  clopidogrel (PLAVIX) 75 MG tablet Take 1 tablet (75 mg total) by mouth daily with breakfast. 06/27/20   Kathyrn Drown D, NP  ergocalciferol (VITAMIN D2) 1.25 MG (50000 UT) capsule Take 1 capsule (50,000 Units total) by mouth once a week. 08/31/20   Isaac Bliss, Rayford Halsted, MD  isosorbide mononitrate (IMDUR) 30 MG 24 hr tablet Take 1 tablet (30 mg total) by mouth daily. 07/14/20   Isaac Bliss, Rayford Halsted, MD  losartan (COZAAR) 50 MG tablet Take 1 tablet (50 mg total) by mouth daily. 07/25/20   Lorretta Harp, MD  metFORMIN (GLUCOPHAGE) 500 MG tablet Take 1 tablet (500 mg total) by mouth daily with breakfast. 05/25/20   Isaac Bliss, Rayford Halsted, MD  metoprolol tartrate (LOPRESSOR) 50 MG tablet Take 2 hours prior to procedure. 09/26/20   Lorretta Harp, MD  ondansetron (ZOFRAN) 8 MG tablet Take 1 tablet (8 mg total) by mouth every 8 (eight) hours as needed for nausea or vomiting. 09/28/20   Orson Slick, MD  pantoprazole (PROTONIX) 40 MG tablet Take 1 tablet (40 mg total) by mouth daily. 07/14/20   Isaac Bliss, Rayford Halsted, MD  potassium chloride SA (KLOR-CON) 20 MEQ tablet Take 1 tablet by mouth twice daily 10/06/20   Orson Slick, MD  torsemide (DEMADEX) 20 MG tablet Take 20 mg by mouth 2 (two) times daily. 08/19/20   [provider]  VEMLIDY 25 MG TABS Take 1 tablet by mouth daily. 09/25/20   [provider]    Allergies    Crestor [rosuvastatin calcium]  Review of Systems   Review of Systems  Constitutional: Negative for chills and fever.  HENT: Negative for ear pain and sore throat.   Eyes: Negative for pain and visual  disturbance.  Respiratory: Positive for shortness of breath. Negative for cough.   Cardiovascular: Positive for chest pain and leg swelling. Negative for palpitations.  Gastrointestinal: Negative for abdominal pain and vomiting.  Genitourinary: Negative for dysuria and hematuria.  Musculoskeletal: Negative for arthralgias and back pain.  Skin: Negative for color change and rash.  Neurological: Negative for seizures and syncope.  All other systems reviewed and are negative.   Physical Exam Updated Vital Signs BP (!) 132/93 (BP Location: Left Arm)   Pulse 79   Temp 99.2 F (37.3 C)   Resp 17   Ht 5\' 11"  (1.803 m)   Wt 108.7 kg   SpO2 95%   BMI 33.42 kg/m   Physical Exam Vitals  and nursing note reviewed.  Constitutional:      General: He is not in acute distress.    Appearance: Normal appearance. He is well-developed. He is not ill-appearing, toxic-appearing or diaphoretic.  HENT:     Head: Normocephalic and atraumatic.  Eyes:     Extraocular Movements: Extraocular movements intact.     Conjunctiva/sclera: Conjunctivae normal.     Pupils: Pupils are equal, round, and reactive to light.  Cardiovascular:     Rate and Rhythm: Normal rate and regular rhythm.     Pulses: Normal pulses.     Heart sounds: Normal heart sounds. No murmur heard.   Pulmonary:     Effort: Pulmonary effort is normal. No respiratory distress.     Breath sounds: Normal breath sounds.  Abdominal:     General: Abdomen is flat.     Palpations: Abdomen is soft.     Tenderness: There is no abdominal tenderness.  Musculoskeletal:        General: No tenderness. Normal range of motion.     Cervical back: Normal range of motion and neck supple.     Right lower leg: Edema present.     Left lower leg: Edema present.     Comments: 2+ nonpitting edema to lower extremities bilaterally.  No circumferential erythema, tenderness  Skin:    General: Skin is warm and dry.     Findings: No bruising or erythema.   Neurological:     General: No focal deficit present.     Mental Status: He is alert and oriented to person, place, and time.     Sensory: No sensory deficit.     Motor: No weakness.  Psychiatric:        Mood and Affect: Mood normal.        Behavior: Behavior normal.     ED Results / Procedures / Treatments   Labs (all labs ordered are listed, but only abnormal results are displayed) Labs Reviewed  CBC WITH DIFFERENTIAL/PLATELET - Abnormal; Notable for the following components:      Result Value   Platelets 120 (*)    All other components within normal limits  BASIC METABOLIC PANEL - Abnormal; Notable for the following components:   BUN 24 (*)    Creatinine, Ser 1.29 (*)    Calcium 7.9 (*)    All other components within normal limits  HEPARIN LEVEL (UNFRACTIONATED) - Abnormal; Notable for the following components:   Heparin Unfractionated <0.10 (*)    All other components within normal limits  TROPONIN I (HIGH SENSITIVITY) - Abnormal; Notable for the following components:   Troponin I (High Sensitivity) 126 (*)    All other components within normal limits  RESP PANEL BY RT-PCR (FLU A&B, COVID) ARPGX2  BRAIN NATRIURETIC PEPTIDE  PROTIME-INR  APTT  HEPARIN LEVEL (UNFRACTIONATED)  CBC  TROPONIN I (HIGH SENSITIVITY)    EKG None  Radiology CT CORONARY MORPH W/CTA COR W/SCORE W/CA W/CM &/OR WO/CM  Result Date: 10/18/2020 EXAM: OVER-READ INTERPRETATION  CT CHEST The following report is an over-read performed by radiologist Dr. Vinnie Langton of [facility], [place] on 10/18/2020. This over-read does not include interpretation of cardiac or coronary anatomy or pathology. The coronary calcium score/coronary CTA interpretation by the cardiologist is attached. COMPARISON:  None. FINDINGS: Aortic atherosclerosis with aneurysmal dilatation of the ascending thoracic aorta (4.8 cm in diameter). Small to moderate pericardial effusion. Multiple filling defects within the pulmonary  arterial tree bilaterally, compatible with pulmonary embolism. Dilatation of the pulmonic  trunk (3.9 cm in diameter). Moderate right-sided pleural effusion lying dependently. Within the visualized portions of the thorax there are no suspicious appearing pulmonary nodules or masses, there is no acute consolidative airspace disease, no left pleural effusion, no pneumothorax and no lymphadenopathy. Visualized portions of the upper abdomen are unremarkable. There are no aggressive appearing lytic or blastic lesions noted in the visualized portions of the skeleton. IMPRESSION: 1. Study is positive for bilateral pulmonary embolism which appear segmental in size. 2. Ground-glass attenuation in the right lower lobe in a distribution of an embolus. Although much of this may be related atelectasis, the possibility of some degree of alveolar hemorrhage from pulmonary infarction is not excluded. There is also a moderate right-sided pleural effusion lying dependently. 3. Small to moderate pericardial effusion. 4. Aortic atherosclerosis with aneurysmal dilatation of the ascending thoracic aorta (4.8 cm in diameter). Ascending thoracic aortic aneurysm. Recommend semi-annual imaging followup by CTA or MRA and referral to cardiothoracic surgery if not already obtained. This recommendation follows 2010 ACCF/AHA/AATS/ACR/ASA/SCA/SCAI/SIR/STS/SVM Guidelines for the Diagnosis and Management of Patients With Thoracic Aortic Disease. Circulation. 2010; 121: Q945-W388. Aortic aneurysm NOS (ICD10-I71.9). 5. Dilatation of the pulmonic trunk (3.9 cm in diameter), which may suggest pulmonary arterial hypertension. These results were called by telephone at the time of interpretation on 10/18/2020 at 1:50 pm to provider [facility], who verbally acknowledged these results. Electronically Signed   By: Vinnie Langton M.D.   On: 10/18/2020 13:51    Procedures .Critical Care Performed by: Garald Balding, PA-C Authorized by: Garald Balding, PA-C   Critical care provider statement:    Critical care time (minutes):  45   Critical care was necessary to treat or prevent imminent or life-threatening deterioration of the following conditions: PE, started heparin.   Critical care was time spent personally by me on the following activities:  Discussions with consultants, evaluation of patient's response to treatment, examination of patient, ordering and performing treatments and interventions, ordering and review of laboratory studies, ordering and review of radiographic studies, pulse oximetry, re-evaluation of patient's condition, obtaining history from patient or surrogate and review of old charts   (including critical care time)  Medications Ordered in ED Medications  heparin ADULT infusion 100 units/mL (25000 units/225mL sodium chloride 0.45%) (1,800 Units/hr Intravenous New Bag/Given 10/18/20 1734)  heparin bolus via infusion 4,000 Units (4,000 Units Intravenous Bolus from Bag 10/18/20 1740)    ED Course  I have reviewed the triage vital signs and the nursing notes.  Pertinent labs & imaging results that were available during my care of the patient were reviewed by me and considered in my medical decision making (see chart for details).    MDM Rules/Calculators/A&P                          59 year old male who presents to the ER with bilateral pulmonary embolisms found on CT coronaries earlier today. I personally reviewed these in the patient's chart. No evidence of heart strain. Vitals on arrival overall reassuring, not tachycardic, tachypneic or hypoxic.  No increased O2 requirements.  Physical exam with 2+ nonpitting edema to her lower extremities, lung and cardiac exam largely benign.  Labs reviewed and interpreted by myself CBC without leukocytosis, platelets of 120.  BMP with a creatinine of 1.29, increased from his baseline of 1.09.  Troponin of 126 likely consistent with PE.  EKG normal sinus rhythm with prolonged QT.   Patient was started on heparin  infusion and drip.  Covid test ordered, consulted hospitalist team Dr. Jamse Arn who will admit the patient for further evaluation and treatment.  Patient remains hemodynamically stable at this time.  Case discussed w/ Dr. Sherry Ruffing who is agreeable to the above plan and disposition.  Final Clinical Impression(s) / ED Diagnoses Final diagnoses:  Bilateral pulmonary embolism [facility])    Rx / DC Orders ED Discharge Orders    None           Lyndel Safe 10/18/20 1844    Tegeler, Gwenyth Allegra, MD 10/18/20 [phone]

## 2020-10-18 NOTE — Telephone Encounter (Signed)
Late note:  1352: Per Dr. Gwenlyn Found, Dr. Madie Reno (Radiologist) was reading pt's ([place]) coronary CT and call with an emergent incidental finding.  Dr. Weber Cooks reads that the pt has bilateral pulmonary emboli.   1356: This RN called the pt immediately and instructed him to go to the emergency room right away to be seen.  Pt verbalized understanding. Will continue to follow up with this pt.

## 2020-10-18 NOTE — ED Triage Notes (Signed)
Discussed with dr j knapp  He will order on this pt

## 2020-10-19 ENCOUNTER — Observation Stay (HOSPITAL_COMMUNITY): Payer: BC Managed Care – PPO

## 2020-10-19 ENCOUNTER — Encounter: Payer: Self-pay | Admitting: Hematology and Oncology

## 2020-10-19 DIAGNOSIS — R778 Other specified abnormalities of plasma proteins: Secondary | ICD-10-CM | POA: Diagnosis present

## 2020-10-19 DIAGNOSIS — E119 Type 2 diabetes mellitus without complications: Secondary | ICD-10-CM

## 2020-10-19 DIAGNOSIS — Z87441 Personal history of nephrotic syndrome: Secondary | ICD-10-CM | POA: Diagnosis not present

## 2020-10-19 DIAGNOSIS — I2699 Other pulmonary embolism without acute cor pulmonale: Principal | ICD-10-CM

## 2020-10-19 DIAGNOSIS — I10 Essential (primary) hypertension: Secondary | ICD-10-CM

## 2020-10-19 DIAGNOSIS — I313 Pericardial effusion (noninflammatory): Secondary | ICD-10-CM | POA: Diagnosis present

## 2020-10-19 DIAGNOSIS — Z7984 Long term (current) use of oral hypoglycemic drugs: Secondary | ICD-10-CM | POA: Diagnosis not present

## 2020-10-19 DIAGNOSIS — E1151 Type 2 diabetes mellitus with diabetic peripheral angiopathy without gangrene: Secondary | ICD-10-CM | POA: Diagnosis present

## 2020-10-19 DIAGNOSIS — Z833 Family history of diabetes mellitus: Secondary | ICD-10-CM | POA: Diagnosis not present

## 2020-10-19 DIAGNOSIS — B181 Chronic viral hepatitis B without delta-agent: Secondary | ICD-10-CM

## 2020-10-19 DIAGNOSIS — I2510 Atherosclerotic heart disease of native coronary artery without angina pectoris: Secondary | ICD-10-CM | POA: Diagnosis present

## 2020-10-19 DIAGNOSIS — Z823 Family history of stroke: Secondary | ICD-10-CM | POA: Diagnosis not present

## 2020-10-19 DIAGNOSIS — Z888 Allergy status to other drugs, medicaments and biological substances status: Secondary | ICD-10-CM | POA: Diagnosis not present

## 2020-10-19 DIAGNOSIS — Z8249 Family history of ischemic heart disease and other diseases of the circulatory system: Secondary | ICD-10-CM | POA: Diagnosis not present

## 2020-10-19 DIAGNOSIS — Z79899 Other long term (current) drug therapy: Secondary | ICD-10-CM | POA: Diagnosis not present

## 2020-10-19 DIAGNOSIS — Z7982 Long term (current) use of aspirin: Secondary | ICD-10-CM | POA: Diagnosis not present

## 2020-10-19 DIAGNOSIS — I361 Nonrheumatic tricuspid (valve) insufficiency: Secondary | ICD-10-CM | POA: Diagnosis not present

## 2020-10-19 DIAGNOSIS — Z86711 Personal history of pulmonary embolism: Secondary | ICD-10-CM | POA: Diagnosis not present

## 2020-10-19 DIAGNOSIS — N049 Nephrotic syndrome with unspecified morphologic changes: Secondary | ICD-10-CM

## 2020-10-19 DIAGNOSIS — E854 Organ-limited amyloidosis: Secondary | ICD-10-CM

## 2020-10-19 DIAGNOSIS — E785 Hyperlipidemia, unspecified: Secondary | ICD-10-CM | POA: Diagnosis present

## 2020-10-19 DIAGNOSIS — I351 Nonrheumatic aortic (valve) insufficiency: Secondary | ICD-10-CM | POA: Diagnosis present

## 2020-10-19 DIAGNOSIS — E8581 Light chain (AL) amyloidosis: Secondary | ICD-10-CM | POA: Diagnosis present

## 2020-10-19 DIAGNOSIS — R079 Chest pain, unspecified: Secondary | ICD-10-CM | POA: Diagnosis not present

## 2020-10-19 DIAGNOSIS — E1121 Type 2 diabetes mellitus with diabetic nephropathy: Secondary | ICD-10-CM | POA: Diagnosis present

## 2020-10-19 DIAGNOSIS — Z87891 Personal history of nicotine dependence: Secondary | ICD-10-CM | POA: Diagnosis not present

## 2020-10-19 DIAGNOSIS — Z7902 Long term (current) use of antithrombotics/antiplatelets: Secondary | ICD-10-CM | POA: Diagnosis not present

## 2020-10-19 DIAGNOSIS — M5136 Other intervertebral disc degeneration, lumbar region: Secondary | ICD-10-CM | POA: Diagnosis present

## 2020-10-19 DIAGNOSIS — Z20822 Contact with and (suspected) exposure to covid-19: Secondary | ICD-10-CM | POA: Diagnosis present

## 2020-10-19 DIAGNOSIS — I739 Peripheral vascular disease, unspecified: Secondary | ICD-10-CM

## 2020-10-19 LAB — GLUCOSE, CAPILLARY
Glucose-Capillary: 102 mg/dL — ABNORMAL HIGH (ref 70–99)
Glucose-Capillary: 89 mg/dL (ref 70–99)
Glucose-Capillary: 95 mg/dL (ref 70–99)
Glucose-Capillary: 103 mg/dL — ABNORMAL HIGH (ref 70–99)

## 2020-10-19 LAB — ECHOCARDIOGRAM COMPLETE
Area-P 1/2: 3.06 cm2
Height: 71 in
P 1/2 time: 537 msec
S' Lateral: 3.2 cm
Weight: 3777.6 oz

## 2020-10-19 LAB — CBC
HCT: 38.1 % — ABNORMAL LOW (ref 39.0–52.0)
Hemoglobin: 13.1 g/dL (ref 13.0–17.0)
MCH: 31.6 pg (ref 26.0–34.0)
MCHC: 34.4 g/dL (ref 30.0–36.0)
MCV: 92 fL (ref 80.0–100.0)
Platelets: 111 10*3/uL — ABNORMAL LOW (ref 150–400)
RBC: 4.14 MIL/uL — ABNORMAL LOW (ref 4.22–5.81)
RDW: 15.4 % (ref 11.5–15.5)
WBC: 5.3 10*3/uL (ref 4.0–10.5)
nRBC: 0 % (ref 0.0–0.2)

## 2020-10-19 LAB — BASIC METABOLIC PANEL
Anion gap: 12 (ref 5–15)
BUN: 21 mg/dL — ABNORMAL HIGH (ref 6–20)
CO2: 23 mmol/L (ref 22–32)
Calcium: 7.8 mg/dL — ABNORMAL LOW (ref 8.9–10.3)
Chloride: 104 mmol/L (ref 98–111)
Creatinine, Ser: 1.24 mg/dL (ref 0.61–1.24)
GFR, Estimated: >60 mL/min (ref >60)
Glucose, Bld: 111 mg/dL — ABNORMAL HIGH (ref 70–99)
Potassium: 3.7 mmol/L (ref 3.5–5.1)
Sodium: 139 mmol/L (ref 135–145)

## 2020-10-19 LAB — HEPARIN LEVEL (UNFRACTIONATED)
Heparin Unfractionated: 0.39 IU/mL (ref 0.30–0.70)
Heparin Unfractionated: 0.34 IU/mL (ref 0.30–0.70)

## 2020-10-19 LAB — TROPONIN I (HIGH SENSITIVITY)
Troponin I (High Sensitivity): 117 ng/L (ref <18)
Troponin I (High Sensitivity): 122 ng/L (ref <18)
Troponin I (High Sensitivity): 115 ng/L (ref <18)

## 2020-10-19 MED ORDER — ISOSORBIDE MONONITRATE ER 30 MG PO TB24
30.0000 mg | ORAL_TABLET | Freq: Every day | ORAL | Status: DC
  Administered 2020-10-19 – 2020-10-20 (×2): 30 mg via ORAL
  Filled 2020-10-19 (×2): qty 1

## 2020-10-19 MED ORDER — PANTOPRAZOLE SODIUM 40 MG PO TBEC
40.0000 mg | DELAYED_RELEASE_TABLET | Freq: Every day | ORAL | Status: DC
  Administered 2020-10-20: 40 mg via ORAL
  Filled 2020-10-19: qty 1

## 2020-10-19 MED ORDER — ATORVASTATIN CALCIUM 80 MG PO TABS
80.0000 mg | ORAL_TABLET | Freq: Every day | ORAL | Status: DC
  Administered 2020-10-19: 80 mg via ORAL
  Filled 2020-10-19: qty 1

## 2020-10-19 MED ORDER — TORSEMIDE 20 MG PO TABS
20.0000 mg | ORAL_TABLET | Freq: Two times a day (BID) | ORAL | Status: DC
  Administered 2020-10-19 – 2020-10-20 (×2): 20 mg via ORAL
  Filled 2020-10-19 (×2): qty 1

## 2020-10-19 MED ORDER — LOSARTAN POTASSIUM 50 MG PO TABS
50.0000 mg | ORAL_TABLET | Freq: Every day | ORAL | Status: DC
  Administered 2020-10-19 – 2020-10-20 (×2): 50 mg via ORAL
  Filled 2020-10-19 (×2): qty 1

## 2020-10-19 NOTE — Progress Notes (Addendum)
PROGRESS NOTE    Samuel Butler  KCL:[id number] DOB: 1961-08-09 DOA: 10/18/2020 PCP: Isaac Bliss, Rayford Halsted, MD   Brief Narrative:  HPI on 10/18/2020 by Dr. Bonnell Public Samuel Butler is an 59 y.o. male with PMH significant for HTN, DM 2, amyloidosis, nephrotic syndrome, PAD who was incidentally found to have pulmonary emboli on coronary CT done today.  Patient states that he has had a progressive decline in his functioning since he has become "swollen".  He does not seem to have a clear understanding of why he is swelling.  He notes that he has not been able to be very functional due to heaviness of his legs due to edema.  He also has been quite short of breath since August.  He states he is short of breath just taking a shower although this improves when he is not quite so swollen.  Last week patient developed a right-sided chest pain which is associated with lying on his right side and with taking deep breaths in and with cough.  No exertional component.  Patient is not sure whether his baseline shortness of breath has worsened over the last week or not.  He notes that his shortness of breath is limiting enough where he cannot really tell if it is better or worse. Assessment & Plan   Pulmonary emboli -CT coronary showed evidence of pulmonary embolism.  Severe CAD.  Likely CTO mid RCA with scattered nonobstructive disease in the left circulation. -Currently on heparin -On room air and maintaining oxygen saturations in the nineties -Echocardiogram showed an EF of 55 to 60%.  No regional wall motion abnormalities.  Severe LVH.  Indeterminate diastolic filling due to E-a fusion.  Mildly elevated pulmonary artery systolic pressure.  Small pericardial effusion.  Trivial mitral valve regurgitation.  Mild to moderate aortic valve regurgitation.  Mild insulation of the aortic root. -will continue heparin for additional 24 hours given continued chest pain -Will likely transition to  Eliquis on 12/10  Chest pain with elevated troponin -Suspect secondary to pulmonary emboli -Suspect demand ischemia. -High-sensitivity troponins have been flat. -Cardiology consulted and appreciated-discussed with Dr. Oval Linsey given CT coronary findings, would continue to treat for PE -Echocardiogram as above  PAD -Patient has been on both aspirin and Plavix -He follows with Dr. Gwenlyn Found, cardiology -Currently on heparin -Would likely continue Plavix on discharge -Will restart statin  Essential hypertension -Home medications have been held, BP is stable -Will restart home medications  Diabetes mellitus, type II -Metformin and Farxiga held -Continue insulin sliding scale and CBG monitoring  Nephrotic syndrome/light chain amyloidosis -Continue torsemide -Patient follows with Dr. Lorenso Courier, oncology, made aware of patient's admission -He is due for another chemotherapy treatment on 12/10   DVT Prophylaxis  heparin  Code Status: Full  Family Communication: None at bedside  Disposition Plan:  Status is: Observation  The patient will require care spanning > 2 midnights and should be moved to inpatient because: continues to have chest pain and on IV heparin.   Dispo: The patient is from: Home              Anticipated d/c is to: Home              Anticipated d/c date is: 1 day              Patient currently is not medically stable to d/c.   Consultants Oncology Cardiology, via phone  Procedures  Echocardiogram  Antibiotics   Anti-infectives (From admission, onward)  None      Subjective:   Samuel Butler seen and examined today.  Continues to have chest pain with minimal movement.  Denies current shortness of breath.  Denies abdominal pain, nausea or vomiting, diarrhea or constipation, dizziness or headache.  Objective:   Vitals:   10/19/20 0200 10/19/20 0400 10/19/20 0500 10/19/20 0700  BP:   124/85 (!) 125/94  Pulse: 91 85 89 85  Resp: (!) 25 18 19 15   Temp:    98.4 F (36.9 C) 98.4 F (36.9 C)  TempSrc:   Oral Oral  SpO2: 93% 97% 93% 96%  Weight:   107.1 kg   Height:        Intake/Output Summary (Last 24 hours) at 10/19/2020 1325 Last data filed at 10/19/2020 0601 Gross per 24 hour  Intake 497.37 ml  Output 150 ml  Net 347.37 ml   Filed Weights   10/18/20 1537 10/18/20 2231 10/19/20 0500  Weight: 108.7 kg 109.1 kg 107.1 kg    Exam  General: Well developed, chronically ill-appearing, NAD  HEENT: NCAT, mucous membranes moist.   Cardiovascular: S1 S2 auscultated, RRR  Respiratory: Clear to auscultation bilaterally with equal chest rise, no wheezing  Abdomen: Soft, nontender, nondistended, + bowel sounds  Extremities: warm dry without cyanosis clubbing. ++LE edema bilaterally  Neuro: AAOx3, nonfocal  Psych: Normal affect and demeanor with intact judgement and insight   Data Reviewed: I have personally reviewed following labs and imaging studies  CBC: Recent Labs  Lab 10/13/20 0816 10/18/20 1537 10/19/20 0031  WBC 3.9* 6.7 5.3  NEUTROABS 2.4 5.2  --   HGB 13.2 13.6 13.1  HCT 40.4 43.1 38.1*  MCV 92.4 97.7 92.0  PLT 163 120* 409*   Basic Metabolic Panel: Recent Labs  Lab 10/13/20 0816 10/18/20 1537 10/19/20 0031  NA 141 140 139  K 3.4* 4.3 3.7  CL 104 104 104  CO2 30 23 23   GLUCOSE 111* 82 111*  BUN 19 24* 21*  CREATININE 1.09 1.29* 1.24  CALCIUM 7.8* 7.9* 7.8*   GFR: Estimated Creatinine Clearance: 78.9 mL/min (by C-G formula based on SCr of 1.24 mg/dL). Liver Function Tests: Recent Labs  Lab 10/13/20 0816  AST 16  ALT 15  ALKPHOS 80  BILITOT 0.6  PROT 4.3*  ALBUMIN 1.5*   No results for input(s): LIPASE, AMYLASE in the last 168 hours. No results for input(s): AMMONIA in the last 168 hours. Coagulation Profile: Recent Labs  Lab 10/18/20 1725  INR 1.0   Cardiac Enzymes: No results for input(s): CKTOTAL, CKMB, CKMBINDEX, TROPONINI in the last 168 hours. BNP (last 3 results) No results  for input(s): PROBNP in the last 8760 hours. HbA1C: No results for input(s): HGBA1C in the last 72 hours. CBG: Recent Labs  Lab 10/18/20 2313 10/19/20 0742 10/19/20 1146  GLUCAP 125* 102* 89   Lipid Profile: No results for input(s): CHOL, HDL, LDLCALC, TRIG, CHOLHDL, LDLDIRECT in the last 72 hours. Thyroid Function Tests: No results for input(s): TSH, T4TOTAL, FREET4, T3FREE, THYROIDAB in the last 72 hours. Anemia Panel: No results for input(s): VITAMINB12, FOLATE, FERRITIN, TIBC, IRON, RETICCTPCT in the last 72 hours. Urine analysis:    Component Value Date/Time   COLORURINE YELLOW 08/04/2020 0402   APPEARANCEUR CLEAR 08/04/2020 0402   LABSPEC 1.025 08/04/2020 0402   PHURINE 6.0 08/04/2020 0402   GLUCOSEU NEGATIVE 08/04/2020 0402   HGBUR MODERATE (A) 08/04/2020 0402   BILIRUBINUR SMALL (A) 08/04/2020 0402   KETONESUR NEGATIVE 08/04/2020 0402  PROTEINUR >300 (A) 08/04/2020 0402   UROBILINOGEN 1.0 04/14/2013 2058   NITRITE NEGATIVE 08/04/2020 0402   LEUKOCYTESUR NEGATIVE 08/04/2020 0402   Sepsis Labs: @LABRCNTIP (procalcitonin:4,lacticidven:4)  ) Recent Results (from the past 240 hour(s))  Resp Panel by RT-PCR (Flu A&B, Covid) Nasopharyngeal Swab     Status: None   Collection Time: 10/18/20  5:25 PM   Specimen: Nasopharyngeal Swab; Nasopharyngeal(NP) swabs in vial transport medium  Result Value Ref Range Status   SARS Coronavirus 2 by RT PCR NEGATIVE NEGATIVE Final    Comment: (NOTE) SARS-CoV-2 target nucleic acids are NOT DETECTED.  The SARS-CoV-2 RNA is generally detectable in upper respiratory specimens during the acute phase of infection. The lowest concentration of SARS-CoV-2 viral copies this assay can detect is 138 copies/mL. A negative result does not preclude SARS-Cov-2 infection and should not be used as the sole basis for treatment or other patient management decisions. A negative result may occur with  improper specimen collection/handling, submission of  specimen other than nasopharyngeal swab, presence of viral mutation(s) within the areas targeted by this assay, and inadequate number of viral copies(<138 copies/mL). A negative result must be combined with clinical observations, patient history, and epidemiological information. The expected result is Negative.  Fact Sheet for Patients:  EntrepreneurPulse.com.au  Fact Sheet for Healthcare Providers:  IncredibleEmployment.be  This test is no t yet approved or cleared by the [place] FDA and  has been authorized for detection and/or diagnosis of SARS-CoV-2 by FDA under an Emergency Use Authorization (EUA). This EUA will remain  in effect (meaning this test can be used) for the duration of the COVID-19 declaration under Section 564(b)(1) of the Act, 21 U.S.C.section 360bbb-3(b)(1), unless the authorization is terminated  or revoked sooner.       Influenza A by PCR NEGATIVE NEGATIVE Final   Influenza B by PCR NEGATIVE NEGATIVE Final    Comment: (NOTE) The Xpert Xpress SARS-CoV-2/FLU/RSV plus assay is intended as an aid in the diagnosis of influenza from Nasopharyngeal swab specimens and should not be used as a sole basis for treatment. Nasal washings and aspirates are unacceptable for Xpert Xpress SARS-CoV-2/FLU/RSV testing.  Fact Sheet for Patients: EntrepreneurPulse.com.au  Fact Sheet for Healthcare Providers: IncredibleEmployment.be  This test is not yet approved or cleared by the [place] FDA and has been authorized for detection and/or diagnosis of SARS-CoV-2 by FDA under an Emergency Use Authorization (EUA). This EUA will remain in effect (meaning this test can be used) for the duration of the COVID-19 declaration under Section 564(b)(1) of the Act, 21 U.S.C. section 360bbb-3(b)(1), unless the authorization is terminated or revoked.  Performed at [facility], [place] [address]  [place], [place] [address]       Radiology Studies: CT CORONARY MORPH W/CTA COR W/SCORE W/CA W/CM &/OR WO/CM  Addendum Date: 10/18/2020   ADDENDUM REPORT: 10/18/2020 18:29 HISTORY: Chest pain/anginal equiv, 11yr CHD risk 10-20%, not treadmill candidate EXAM: Cardiac/Coronary CT TECHNIQUE: The patient was scanned on a [employer]. PROTOCOL: A 120 kV prospective scan was triggered in the descending thoracic aorta at 111 HU's. Axial non-contrast 3 mm slices were carried out through the heart. The data set was analyzed on a dedicated work station and scored using the [place]. Gantry rotation speed was 250 msecs and collimation was 0.6 mm. Beta blockade and 0.8 mg of sl NTG was given. The 3D data set was reconstructed in 5% intervals of 35-75% of the R-R cycle. Diastolic phases were analyzed on a  dedicated work station using MPR, MIP and VRT modes. The patient received 23mL OMNIPAQUE IOHEXOL 350 MG/ML SOLN of contrast. FINDINGS: Coronary calcium score: The patient's coronary artery calcium score is 135, which places the patient in the 60 percentile. Coronary arteries: Two separate ostia for left anterior descending and left circumflex coronary origins. Normal right coronary anatomy. Co-dominance Right Coronary Artery: Normal caliber vessel. There is a portion of heavily calcified mixed plaque in the proximal RCA with >70% stenosis. There is then a small caliber portion of the proximal to mid RCA with likely completely occluded mid RCA. The distal RCA appears to reconstitute via collaterals. Left Anterior Descending Coronary Artery: Arises directly from [facility] of aortic root. Normal caliber vessel. There is mixed calcified and noncalcified plaque just proximal to D1, with 1-24% stenosis. Gives rise to 2 diagonal branches. Distal LAD wraps apex Left Circumflex Artery: Arises directly from [facility] of aortic root. normal caliber vessel. Trivial plaque without significant stenosis. Gives rise to large OM branch.  Aorta: Moderately enlarged, 48 mm at the mid ascending aorta (level of the PA bifurcation) measured double oblique. No calcifications. No dissection. Aortic Valve: No calcifications. Trileaflet. Other findings: Normal pulmonary vein drainage into the left atrium. Normal left atrial appendage without a thrombus. Enlarged pulmonary artery size (3.8 cm) Moderate pericardial effusion Evidence of pulmonary embolism, most prominent in the RPA IMPRESSION: 1. Evidence of pulmonary embolism. Dr. Gwenlyn Found notified, patient has been sent to ER for evaluation. Enlarged PA size. 2. Severe CAD, CADRADS = 4. CT FFR will be reported separately. Likely CTO mid RCA with scattered nonobstructive disease in the left circulation. 3. Coronary calcium score of 135. This was 69 percentile for age and sex matched control. 4. Co dominant circulation, with individual ostia for the LAD and LCX arising from the [facility]. 5.  Moderate pericardial effusion 6.  Moderately enlarged ascending aorta. Findings communicated with Dr. Gwenlyn Found. Electronically Signed   By: Buford Dresser M.D.   On: 10/18/2020 18:29   Result Date: 10/18/2020 EXAM: OVER-READ INTERPRETATION  CT CHEST The following report is an over-read performed by radiologist Dr. Vinnie Langton of [facility], [place] on 10/18/2020. This over-read does not include interpretation of cardiac or coronary anatomy or pathology. The coronary calcium score/coronary CTA interpretation by the cardiologist is attached. COMPARISON:  None. FINDINGS: Aortic atherosclerosis with aneurysmal dilatation of the ascending thoracic aorta (4.8 cm in diameter). Small to moderate pericardial effusion. Multiple filling defects within the pulmonary arterial tree bilaterally, compatible with pulmonary embolism. Dilatation of the pulmonic trunk (3.9 cm in diameter). Moderate right-sided pleural effusion lying dependently. Within the visualized portions of the thorax there are no suspicious appearing pulmonary  nodules or masses, there is no acute consolidative airspace disease, no left pleural effusion, no pneumothorax and no lymphadenopathy. Visualized portions of the upper abdomen are unremarkable. There are no aggressive appearing lytic or blastic lesions noted in the visualized portions of the skeleton. IMPRESSION: 1. Study is positive for bilateral pulmonary embolism which appear segmental in size. 2. Ground-glass attenuation in the right lower lobe in a distribution of an embolus. Although much of this may be related atelectasis, the possibility of some degree of alveolar hemorrhage from pulmonary infarction is not excluded. There is also a moderate right-sided pleural effusion lying dependently. 3. Small to moderate pericardial effusion. 4. Aortic atherosclerosis with aneurysmal dilatation of the ascending thoracic aorta (4.8 cm in diameter). Ascending thoracic aortic aneurysm. Recommend semi-annual imaging followup by CTA or MRA and referral to cardiothoracic surgery  if not already obtained. This recommendation follows 2010 ACCF/AHA/AATS/ACR/ASA/SCA/SCAI/SIR/STS/SVM Guidelines for the Diagnosis and Management of Patients With Thoracic Aortic Disease. Circulation. 2010; 121: Y223-V612. Aortic aneurysm NOS (ICD10-I71.9). 5. Dilatation of the pulmonic trunk (3.9 cm in diameter), which may suggest pulmonary arterial hypertension. These results were called by telephone at the time of interpretation on 10/18/2020 at 1:50 pm to provider [facility], who verbally acknowledged these results. Electronically Signed: By: Vinnie Langton M.D. On: 10/18/2020 13:51   ECHOCARDIOGRAM COMPLETE  Result Date: 10/19/2020    ECHOCARDIOGRAM REPORT   Patient Name:   Samuel Butler Date of Exam: 10/19/2020 Medical Rec #:  [id number]       Height:       71.0 in Accession #:    [id number]      Weight:       236.1 lb Date of Birth:  1961-10-27       BSA:          2.263 m Patient Age:    73 years        BP:           125/94 mmHg Patient  Gender: M               HR:           92 bpm. Exam Location:  Inpatient Procedure: 2D Echo Indications:    Pulmonary Embolus I26.99  History:        Patient has prior history of Echocardiogram examinations, most                 recent 08/05/2020. Risk Factors:Dyslipidemia, Diabetes and                 Hypertension.  Sonographer:    Mikki Santee RDCS (AE) Referring Phys: [id number] [place]  1. Compared to previous echo IVC is dilated. Ascending aorta is unchanged.  2. Left ventricular ejection fraction, by estimation, is 55 to 60%. The left ventricle has normal function. The left ventricle has no regional wall motion abnormalities. There is severe left ventricular hypertrophy. Indeterminate diastolic filling due to E-A fusion.  3. Right ventricular systolic function is normal. The right ventricular size is normal. There is mildly elevated pulmonary artery systolic pressure.  4. Left atrial size was moderately dilated.  5. Right atrial size was mildly dilated.  6. A small pericardial effusion is present.  7. The mitral valve is normal in structure. Trivial mitral valve regurgitation.  8. The aortic valve is normal in structure. Aortic valve regurgitation is mild to moderate.  9. Aortic dilatation noted. There is mild dilatation of the aortic root, measuring 41 mm. There is moderate dilatation of the ascending aorta, measuring 47 mm. 10. The inferior vena cava is dilated in size with <50% respiratory variability, suggesting right atrial pressure of 15 mmHg. FINDINGS  Left Ventricle: Left ventricular ejection fraction, by estimation, is 55 to 60%. The left ventricle has normal function. The left ventricle has no regional wall motion abnormalities. The left ventricular internal cavity size was normal in size. There is  severe left ventricular hypertrophy. Indeterminate diastolic filling due to E-A fusion. Right Ventricle: The right ventricular size is normal. No increase in right ventricular  wall thickness. Right ventricular systolic function is normal. There is mildly elevated pulmonary artery systolic pressure. The tricuspid regurgitant velocity is 2.82  m/s, and with an assumed right atrial pressure of 8 mmHg, the estimated right ventricular systolic pressure is 03.0 mmHg. Left Atrium:  Left atrial size was moderately dilated. Right Atrium: Right atrial size was mildly dilated. Pericardium: A small pericardial effusion is present. Mitral Valve: The mitral valve is normal in structure. Trivial mitral valve regurgitation. Tricuspid Valve: The tricuspid valve is normal in structure. Tricuspid valve regurgitation is mild. Aortic Valve: The aortic valve is normal in structure. Aortic valve regurgitation is mild to moderate. Aortic regurgitation PHT measures 537 msec. Pulmonic Valve: The pulmonic valve was normal in structure. Pulmonic valve regurgitation is not visualized. Aorta: Aortic dilatation noted. There is mild dilatation of the aortic root, measuring 41 mm. There is moderate dilatation of the ascending aorta, measuring 47 mm. Venous: The inferior vena cava is dilated in size with less than 50% respiratory variability, suggesting right atrial pressure of 15 mmHg. IAS/Shunts: No atrial level shunt detected by color flow Doppler.  LEFT VENTRICLE PLAX 2D LVIDd:         4.70 cm  Diastology LVIDs:         3.20 cm  LV e' medial:    4.03 cm/s LV PW:         1.90 cm  LV E/e' medial:  22.3 LV IVS:        1.80 cm  LV e' lateral:   4.47 cm/s LVOT diam:     2.60 cm  LV E/e' lateral: 20.1 LV SV:         93 LV SV Index:   41 LVOT Area:     5.31 cm  RIGHT VENTRICLE RV S prime:     12.80 cm/s TAPSE (M-mode): 1.9 cm LEFT ATRIUM           Index       RIGHT ATRIUM           Index LA diam:      3.10 cm 1.37 cm/m  RA Area:     21.70 cm LA Vol (A2C): 88.0 ml 38.89 ml/m RA Volume:   68.80 ml  30.41 ml/m LA Vol (A4C): 92.3 ml 40.79 ml/m  AORTIC VALVE LVOT Vmax:   110.00 cm/s LVOT Vmean:  80.600 cm/s LVOT VTI:     0.176 m AI PHT:      537 msec  AORTA Ao Root diam: 4.10 cm MITRAL VALVE                TRICUSPID VALVE MV Area (PHT): 3.06 cm     TR Peak grad:   31.8 mmHg MV Decel Time: 248 msec     TR Vmax:        282.00 cm/s MV E velocity: 90.00 cm/s MV A velocity: 111.00 cm/s  SHUNTS MV E/A ratio:  0.81         Systemic VTI:  0.18 m                             Systemic Diam: 2.60 cm Dorris Carnes MD Electronically signed by Dorris Carnes MD Signature Date/Time: 10/19/2020/12:44:55 PM    Final      Scheduled Meds: . insulin aspart  0-6 Units Subcutaneous TID WC   Continuous Infusions: . heparin 1,800 Units/hr (10/19/20 0623)     LOS: 0 days   Time Spent in minutes   45 minutes  Halah Whiteside D.O. on 10/19/2020 at 1:25 PM  Between 7am to 7pm - Please see pager noted on [email or web address]  After 7pm go to [email or web address]  And look for the night coverage person covering for me  after hours  [employer]  [phone]

## 2020-10-19 NOTE — Progress Notes (Signed)
[facility] for heparin Indication: pulmonary embolus  Assessment: 39 YOM with a history of amyloidosis of the kidneys on chemotherapy found to have an acute bilateral PE. Pharmacy consulted to start IV heparin. Of note, patient did express some recent bleeding from his rectum after his bowel movement.   Heparin level 0.39 units/ml  Hg 13.1, PTLC 111  Goal of Therapy:  Heparin level 0.3-0.7 units/ml Monitor platelets by anticoagulation protocol: Yes   Plan:  -Continue IV heparin infusion at 1800 units/hr -F/u 6 hr HL -Monitor daily HL, CBC and s/s of bleeding   Thanks for allowing pharmacy to be a part of this patient's care.  Excell Seltzer, PharmD Clinical Pharmacist

## 2020-10-19 NOTE — Progress Notes (Signed)
  Echocardiogram 2D Echocardiogram has been performed.  Jennette Dubin 10/19/2020, 9:38 AM

## 2020-10-19 NOTE — TOC Benefit Eligibility Note (Signed)
Transition of Care [facility]) Benefit Eligibility Note    Patient Details  Name: Samuel Butler MRN: [id number] Date of Birth: Oct 25, 1961   Medication/Dose: ELIQUIS  2.5 MG BID  ELIQUIS  5 MG BID   and  XARELTO  15 MG BID   XARELTO   20 MG DAILY  Covered?: Yes  Tier: 2 Drug  Prescription Coverage Preferred Pharmacy: [facility] with Person/Company/Phone Number:: TAMARA  @ [employer] #  [phone]  Co-Pay: [place]  Prior Approval: No  Deductible: Met       Memory Argue Phone Number: 10/19/2020, 10:50 AM

## 2020-10-19 NOTE — Progress Notes (Signed)
ANTICOAGULATION CONSULT NOTE - Follow Up Consult  Pharmacy Consult for Heparin Indication: pulmonary embolus  Allergies  Allergen Reactions  . Crestor [Rosuvastatin Calcium] Other (See Comments)    Joint aches    Patient Measurements: Height: 5\' 11"  (180.3 cm) Weight: 107.1 kg (236 lb 1.6 oz) IBW/kg (Calculated) : 75.3 Heparin Dosing Weight:    Vital Signs: Temp: 98.4 F (36.9 C) (12/09 0700) Temp Source: Oral (12/09 0700) BP: 125/94 (12/09 0700) Pulse Rate: 85 (12/09 0700)  Labs: Recent Labs    10/18/20 1537 10/18/20 1610 10/18/20 1725 10/18/20 2130 10/19/20 0031 10/19/20 0325 10/19/20 0952  HGB 13.6  --   --   --  13.1  --   --   HCT 43.1  --   --   --  38.1*  --   --   PLT 120*  --   --   --  111*  --   --   APTT  --   --  26  --   --   --   --   LABPROT  --   --  12.7  --   --   --   --   INR  --   --  1.0  --   --   --   --   HEPARINUNFRC  --  <0.10*  --   --  0.39  --  0.34  CREATININE 1.29*  --   --   --  1.24  --   --   TROPONINIHS 126*  --  134* 110*  --  117*  --     Estimated Creatinine Clearance: 78.9 mL/min (by C-G formula based on SCr of 1.24 mg/dL).  Assessment: Anticoag: PE in the setting of kidney cancer. Heparin level 0.39, repeat 0.34 in goal. Hgb WNL. Plts only 111. Copay $0 for both DOACS  Goal of Therapy:  Heparin level 0.3-0.7 units/ml Monitor platelets by anticoagulation protocol: Yes   Plan:  - Con't IV heparin 1800 units/hr -Monitor daily HL, CBC and s/s of bleeding  - f/u transition to [place]. Alford Highland, PharmD, BCPS Clinical Staff Pharmacist [email or web address] Alford Highland, [employer] 10/19/2020,12:31 PM

## 2020-10-20 ENCOUNTER — Inpatient Hospital Stay: Payer: BC Managed Care – PPO

## 2020-10-20 LAB — GLUCOSE, CAPILLARY: Glucose-Capillary: 98 mg/dL (ref 70–99)

## 2020-10-20 LAB — CBC
HCT: 33.8 % — ABNORMAL LOW (ref 39.0–52.0)
Hemoglobin: 11.3 g/dL — ABNORMAL LOW (ref 13.0–17.0)
MCH: 31.1 pg (ref 26.0–34.0)
MCHC: 33.4 g/dL (ref 30.0–36.0)
MCV: 93.1 fL (ref 80.0–100.0)
Platelets: 102 10*3/uL — ABNORMAL LOW (ref 150–400)
RBC: 3.63 MIL/uL — ABNORMAL LOW (ref 4.22–5.81)
RDW: 15.1 % (ref 11.5–15.5)
WBC: 4.1 10*3/uL (ref 4.0–10.5)
nRBC: 0 % (ref 0.0–0.2)

## 2020-10-20 LAB — BASIC METABOLIC PANEL
Anion gap: 9 (ref 5–15)
BUN: 19 mg/dL (ref 6–20)
CO2: 26 mmol/L (ref 22–32)
Calcium: 7.2 mg/dL — ABNORMAL LOW (ref 8.9–10.3)
Chloride: 105 mmol/L (ref 98–111)
Creatinine, Ser: 1.24 mg/dL (ref 0.61–1.24)
GFR, Estimated: >60 mL/min (ref >60)
Glucose, Bld: 96 mg/dL (ref 70–99)
Potassium: 3.5 mmol/L (ref 3.5–5.1)
Sodium: 140 mmol/L (ref 135–145)

## 2020-10-20 LAB — HEPARIN LEVEL (UNFRACTIONATED): Heparin Unfractionated: 0.25 IU/mL — ABNORMAL LOW (ref 0.30–0.70)

## 2020-10-20 MED ORDER — APIXABAN 5 MG PO TABS
10.0000 mg | ORAL_TABLET | Freq: Two times a day (BID) | ORAL | Status: DC
  Administered 2020-10-20: 10 mg via ORAL
  Filled 2020-10-20: qty 2

## 2020-10-20 MED ORDER — APIXABAN 5 MG PO TABS: 10.0000 mg | ORAL_TABLET | Freq: Two times a day (BID) | ORAL | Status: DC

## 2020-10-20 MED ORDER — APIXABAN 5 MG PO TABS: 5.0000 mg | ORAL_TABLET | Freq: Two times a day (BID) | ORAL | Status: DC

## 2020-10-20 MED ORDER — APIXABAN (ELIQUIS) VTE STARTER PACK (10MG AND 5MG): ORAL_TABLET | ORAL | 0 refills | Status: DC

## 2020-10-20 MED ORDER — METFORMIN HCL 500 MG PO TABS: 500.0000 mg | ORAL_TABLET | Freq: Every day | ORAL | Status: DC

## 2020-10-20 NOTE — TOC Transition Note (Signed)
Transition of Care [facility]) - CM/SW Discharge Note   Patient Details  Name: Samuel Butler [place] MRN: [id number] Date of Birth: [birth year]  Transition of Care [facility]) CM/SW Contact:  Zenon Mayo, RN Phone Number: 10/20/2020, 10:00 AM   Clinical Narrative:    NCM spoke with patient, informed him of the copay amt for eliquis which is zero dollars.  NCM gave him the 30 day free coupon for his eliquis.  NCM gave patieent the little blue book for free meals and pantries and the little green book for free meals.   Final next level of care: Home/Self Care Barriers to Discharge: No Barriers Identified   Patient Goals and CMS Choice Patient states their goals for this hospitalization and ongoing recovery are:: get better      Discharge Placement                       Discharge Plan and Services                  DME Agency: NA       HH Arranged: NA          Social Determinants of Health (SDOH) Interventions Food Insecurity Interventions: Other (Comment) (will give food pantry information to help) Transportation Interventions: Intervention Not Indicated   Readmission Risk Interventions No flowsheet data found.

## 2020-10-20 NOTE — Progress Notes (Signed)
ANTICOAGULATION CONSULT NOTE - Follow Up Consult  Pharmacy Consult for Heparin Indication: pulmonary embolus  Assessment: Anticoag: PE in the setting of kidney cancer. Heparin level this am 0.25 units/ml.  No issues noted with infusion  Goal of Therapy:  Heparin level 0.3-0.7 units/ml Monitor platelets by anticoagulation protocol: Yes   Plan:  - Increase heparin to 1900 units/hr -Monitor daily HL, CBC and s/s of bleeding  - f/u transition to [place], [place] 10/20/2020,4:03 AM

## 2020-10-20 NOTE — Discharge Instructions (Signed)
Pulmonary Embolism  A pulmonary embolism (PE) is a sudden blockage or decrease of blood flow in one or both lungs. Most blockages come from a blood clot that forms in the vein of a lower leg, thigh, or arm (deep vein thrombosis, DVT) and travels to the lungs. A clot is blood that has thickened into a gel or solid. PE is a dangerous and life-threatening condition that needs to be treated right away. What are the causes? This condition is usually caused by a blood clot that forms in a vein and moves to the lungs. In rare cases, it may be caused by air, fat, part of a tumor, or other tissue that moves through the veins and into the lungs. What increases the risk? The following factors may make you more likely to develop this condition:  Experiencing a traumatic injury, such as breaking a hip or leg.  Having: ? A spinal cord injury. ? Orthopedic surgery, especially hip or knee replacement. ? Any major surgery. ? A stroke. ? DVT. ? Blood clots or blood clotting disease. ? Long-term (chronic) lung or heart disease. ? Cancer treated with chemotherapy. ? A central venous catheter.  Taking medicines that contain estrogen. These include birth control pills and hormone replacement therapy.  Being: ? Pregnant. ? In the period of time after your baby is delivered (postpartum). ? Older than age 68. ? Overweight. ? A smoker, especially if you have other risks. What are the signs or symptoms? Symptoms of this condition usually start suddenly and include:  Shortness of breath during activity or at rest.  Coughing, coughing up blood, or coughing up blood-tinged mucus.  Chest pain that is often worse with deep breaths.  Rapid or irregular heartbeat.  Feeling light-headed or dizzy.  Fainting.  Feeling anxious.  Fever.  Sweating.  Pain and swelling in a leg. This is a symptom of DVT, which can lead to PE. How is this diagnosed? This condition may be diagnosed based on:  Your medical  history.  A physical exam.  Blood tests.  CT pulmonary angiogram. This test checks blood flow in and around your lungs.  Ventilation-perfusion scan, also called a lung VQ scan. This test measures air flow and blood flow to the lungs.  An ultrasound of the legs. How is this treated? Treatment for this condition depends on many factors, such as the cause of your PE, your risk for bleeding or developing more clots, and other medical conditions you have. Treatment aims to remove, dissolve, or stop blood clots from forming or growing larger. Treatment may include:  Medicines, such as: ? Blood thinning medicines (anticoagulants) to stop clots from forming. ? Medicines that dissolve clots (thrombolytics).  Procedures, such as: ? Using a flexible tube to remove a blood clot (embolectomy) or to deliver medicine to destroy it (catheter-directed thrombolysis). ? Inserting a filter into a large vein that carries blood to the heart (inferior vena cava). This filter (vena cava filter) catches blood clots before they reach the lungs. ? Surgery to remove the clot (surgical embolectomy). This is rare. You may need a combination of immediate, long-term (up to 3 months after diagnosis), and extended (more than 3 months after diagnosis) treatments. Your treatment may continue for several months (maintenance therapy). You and your health care provider will work together to choose the treatment program that is best for you. Follow these instructions at home: Medicines  Take over-the-counter and prescription medicines only as told by your health care provider.  If you  are taking an anticoagulant medicine: ? Take the medicine every day at the same time each day. ? Understand what foods and drugs interact with your medicine. ? Understand the side effects of this medicine, including excessive bruising or bleeding. Ask your health care provider or pharmacist about other side effects. General  instructions  Wear a medical alert bracelet or carry a medical alert card that says you have had a PE and lists what medicines you take.  Ask your health care provider when you may return to your normal activities. Avoid sitting or lying for a long time without moving.  Maintain a healthy weight. Ask your health care provider what weight is healthy for you.  Do not use any products that contain nicotine or tobacco, such as cigarettes, e-cigarettes, and chewing tobacco. If you need help quitting, ask your health care provider.  Talk with your health care provider about any travel plans. It is important to make sure that you are still able to take your medicine while on trips.  Keep all follow-up visits as told by your health care provider. This is important. Contact a health care provider if:  You missed a dose of your blood thinner medicine. Get help right away if:  You have: ? New or increased pain, swelling, warmth, or redness in an arm or leg. ? Numbness or tingling in an arm or leg. ? Shortness of breath during activity or at rest. ? A fever. ? Chest pain. ? A rapid or irregular heartbeat. ? A severe headache. ? Vision changes. ? A serious fall or accident, or you hit your head. ? Stomach (abdominal) pain. ? Blood in your vomit, stool, or urine. ? A cut that will not stop bleeding.  You cough up blood.  You feel light-headed or dizzy.  You cannot move your arms or legs.  You are confused or have memory loss. These symptoms may represent a serious problem that is an emergency. Do not wait to see if the symptoms will go away. Get medical help right away. Call your local emergency services (911 in the [place] Do not drive yourself to the hospital. Summary  A pulmonary embolism (PE) is a sudden blockage or decrease of blood flow in one or both lungs. PE is a dangerous and life-threatening condition that needs to be treated right away.  Treatments for this condition usually  include medicines to thin your blood (anticoagulants) or medicines to break apart blood clots (thrombolytics).  If you are given blood thinners, it is important to take the medicine every day at the same time each day.  Understand what foods and drugs interact with any medicines that you are taking.  If you have signs of PE or DVT, call your local emergency services (911 in the [place] This information is not intended to replace advice given to you by your health care provider. Make sure you discuss any questions you have with your health care provider. Document Revised: 08/05/2018 Document Reviewed: 08/05/2018 Elsevier Patient Education  2020 [employer] on my medicine - ELIQUIS (apixaban)  Why was Eliquis prescribed for you? Eliquis was prescribed to treat blood clots that may have been found in the veins of your legs (deep vein thrombosis) or in your lungs (pulmonary embolism) and to reduce the risk of them occurring again.  What do You need to know about Eliquis ? The starting dose is 10 mg (two 5 mg tablets) taken TWICE daily for the FIRST SEVEN (7) DAYS, then  on 10/27/2020 the dose is reduced to ONE 5 mg tablet taken TWICE daily.  Eliquis may be taken with or without food.   Try to take the dose about the same time in the morning and in the evening. If you have difficulty swallowing the tablet whole please discuss with your pharmacist how to take the medication safely.  Take Eliquis exactly as prescribed and DO NOT stop taking Eliquis without talking to the doctor who prescribed the medication.  Stopping may increase your risk of developing a new blood clot.  Refill your prescription before you run out.  After discharge, you should have regular check-up appointments with your healthcare provider that is prescribing your Eliquis.    What do you do if you miss a dose? If a dose of ELIQUIS is not taken at the scheduled time, take it as soon as possible on the same  day and twice-daily administration should be resumed. The dose should not be doubled to make up for a missed dose.  Important Safety Information A possible side effect of Eliquis is bleeding. You should call your healthcare provider right away if you experience any of the following: ? Bleeding from an injury or your nose that does not stop. ? Unusual colored urine (red or dark brown) or unusual colored stools (red or black). ? Unusual bruising for unknown reasons. ? A serious fall or if you hit your head (even if there is no bleeding).  Some medicines may interact with Eliquis and might increase your risk of bleeding or clotting while on Eliquis. To help avoid this, consult your healthcare provider or pharmacist prior to using any new prescription or non-prescription medications, including herbals, vitamins, non-steroidal anti-inflammatory drugs (NSAIDs) and supplements.  This website has more information on Eliquis (apixaban): http://www.eliquis.com/eliquis/home   Anemia  Anemia is a condition in which you do not have enough red blood cells or hemoglobin. Hemoglobin is a substance in red blood cells that carries oxygen. When you do not have enough red blood cells or hemoglobin (are anemic), your body cannot get enough oxygen and your organs may not work properly. As a result, you may feel very tired or have other problems. What are the causes? Common causes of anemia include:  Excessive bleeding. Anemia can be caused by excessive bleeding inside or outside the body, including bleeding from the intestine or from periods in women.  Poor nutrition.  Long-lasting (chronic) kidney, thyroid, and liver disease.  Bone marrow disorders.  Cancer and treatments for cancer.  HIV (human immunodeficiency virus) and AIDS (acquired immunodeficiency syndrome).  Treatments for HIV and AIDS.  Spleen problems.  Blood disorders.  Infections, medicines, and autoimmune disorders that destroy red  blood cells. What are the signs or symptoms? Symptoms of this condition include:  Minor weakness.  Dizziness.  Headache.  Feeling heartbeats that are irregular or faster than normal (palpitations).  Shortness of breath, especially with exercise.  Paleness.  Cold sensitivity.  Indigestion.  Nausea.  Difficulty sleeping.  Difficulty concentrating. Symptoms may occur suddenly or develop slowly. If your anemia is mild, you may not have symptoms. How is this diagnosed? This condition is diagnosed based on:  Blood tests.  Your medical history.  A physical exam.  Bone marrow biopsy. Your health care provider may also check your stool (feces) for blood and may do additional testing to look for the cause of your bleeding. You may also have other tests, including:  Imaging tests, such as a CT scan or MRI.  Endoscopy.  Colonoscopy. How is this treated? Treatment for this condition depends on the cause. If you continue to lose a lot of blood, you may need to be treated at a hospital. Treatment may include:  Taking supplements of iron, vitamin X90, or folic acid.  Taking a hormone medicine (erythropoietin) that can help to stimulate red blood cell growth.  Having a blood transfusion. This may be needed if you lose a lot of blood.  Making changes to your diet.  Having surgery to remove your spleen. Follow these instructions at home:  Take over-the-counter and prescription medicines only as told by your health care provider.  Take supplements only as told by your health care provider.  Follow any diet instructions that you were given.  Keep all follow-up visits as told by your health care provider. This is important. Contact a health care provider if:  You develop new bleeding anywhere in the body. Get help right away if:  You are very weak.  You are short of breath.  You have pain in your abdomen or chest.  You are dizzy or feel faint.  You have trouble  concentrating.  You have bloody or black, tarry stools.  You vomit repeatedly or you vomit up blood. Summary  Anemia is a condition in which you do not have enough red blood cells or enough of a substance in your red blood cells that carries oxygen (hemoglobin).  Symptoms may occur suddenly or develop slowly.  If your anemia is mild, you may not have symptoms.  This condition is diagnosed with blood tests as well as a medical history and physical exam. Other tests may be needed.  Treatment for this condition depends on the cause of the anemia. This information is not intended to replace advice given to you by your health care provider. Make sure you discuss any questions you have with your health care provider. Document Revised: 10/10/2017 Document Reviewed: 11/29/2016 Elsevier Patient Education  [place].

## 2020-10-20 NOTE — Progress Notes (Signed)
D/C instructions given and reviewed. Questions asked and answered. Tele and IV's removed, tolerated well.

## 2020-10-20 NOTE — Discharge Summary (Signed)
Physician Discharge Summary  Samuel Butler JKK:[id number] DOB: 1961-01-29 DOA: 10/18/2020  PCP: Isaac Bliss, Rayford Halsted, MD  Admit date: 10/18/2020 Discharge date: 10/20/2020  Time spent: 45 minutes  Recommendations for Outpatient Follow-up:  Patient will be discharged to home.  Patient will need to follow up with primary care provider within one week of discharge.  Follow up with cardiology and oncology. Patient should continue medications as prescribed.  Patient should follow a heart healthy/carb modified diet.   Discharge Diagnoses:  Pulmonary emboli Chest pain with elevated troponin PAD Essential hypertension Diabetes mellitus, type II Nephrotic syndrome/light chain amyloidosis  Discharge Condition: Stable  Diet recommendation: heart healthy/carb modified   Filed Weights   10/18/20 2231 10/19/20 0500 10/20/20 0400  Weight: 109.1 kg 107.1 kg 107.5 kg    History of present illness:  on 10/18/2020 by Dr. Noralee Chars Wardis an 59 y.o.malewith PMH significant for HTN, DM 2, amyloidosis, nephrotic syndrome, PAD who was incidentally found to have pulmonary emboli on coronary CT done today.  Patient states that he has had a progressive decline in his functioning since he has become "swollen". He does not seem to have a clear understanding of why he is swelling. He notes that he has not been able to be very functional due to heaviness of his legs due to edema. He also has been quite short of breath since August. He states he is short of breath just taking a shower although this improves when he is not quite so swollen.  Last week patient developed a right-sided chest pain which is associated with lying on his right side and with taking deep breaths in and with cough. No exertional component. Patient is not sure whether his baseline shortness of breath has worsened over the last week or not. He notes that his shortness of breath is limiting enough where  he cannot really tell if it is better or worse.  Hospital Course:  Pulmonary emboli -CT coronary showed evidence of pulmonary embolism.  Severe CAD.  Likely CTO mid RCA with scattered nonobstructive disease in the left circulation. -was placed on IV heparin -On room air and maintaining oxygen saturations in the nineties -Echocardiogram showed an EF of 55 to 60%.  No regional wall motion abnormalities.  Severe LVH.  Indeterminate diastolic filling due to E-a fusion.  Mildly elevated pulmonary artery systolic pressure.  Small pericardial effusion.  Trivial mitral valve regurgitation.  Mild to moderate aortic valve regurgitation.  Mild insulation of the aortic root. -transitioned to Eliquis  Chest pain with elevated troponin -Suspect secondary to pulmonary emboli -Suspect demand ischemia. -High-sensitivity troponins have been flat. -Cardiology consulted and appreciated-discussed with Dr. Oval Linsey given CT coronary findings, would continue to treat for PE -Echocardiogram as above  PAD -Patient has been on both aspirin and Plavix -He follows with Dr. Gwenlyn Found, cardiology -Currently on heparin -Continue statin and plavix -discontinue aspirin given use of Eliquis  Essential hypertension -BP stable -Continue home meds  Diabetes mellitus, type II -Metformin held- restart on 12/11 -last hemoglobin A1c 6.4 on 08/31/20  Nephrotic syndrome/light chain amyloidosis -Continue torsemide -Patient follows with Dr. Lorenso Courier, oncology, made aware of patient's admission -He is due for another chemotherapy treatment today 12/10- however per conversation with Dr. Lorenso Courier- will reschedule  Consultants Oncology, via phone Cardiology, via phone  Procedures  Echocardiogram   Discharge Exam: Vitals:   10/20/20 0034 10/20/20 0400  BP: 115/82 134/88  Pulse: 90 83  Resp: (!) 23 (!) 21  Temp: 98.5  F (36.9 C) 98.3 F (36.8 C)  SpO2: 97% 96%     General: Well developed, chronically ill  appearing, NAD  HEENT: NCAT, mucous membranes moist.  Cardiovascular: S1 S2 auscultated, RRR  Respiratory: Clear to auscultation bilaterally with equal chest rise, no wheezing  Abdomen: Soft, nontender, nondistended, + bowel sounds  Extremities: warm dry without cyanosis clubbing. LE edema B/L   Neuro: AAOx3, nonfocal  Psych: pleasant, appropriate mood and affect  Discharge Instructions Discharge Instructions    Discharge instructions   Complete by: As directed    Patient will be discharged to home.  Patient will need to follow up with primary care provider within one week of discharge.  Follow up with cardiology and oncology. Patient should continue medications as prescribed.  Patient should follow a heart healthy/carb modified diet.   Restart metformin on 10/21/2020   Increase activity slowly   Complete by: As directed      Allergies as of 10/20/2020      Reactions   Crestor [rosuvastatin Calcium] Other (See Comments)   Joint aches      Medication List    STOP taking these medications   acyclovir 400 MG tablet Commonly known as: ZOVIRAX   aspirin 81 MG EC tablet   cephALEXin 500 MG capsule Commonly known as: KEFLEX   metoprolol tartrate 50 MG tablet Commonly known as: LOPRESSOR     TAKE these medications   acetaminophen 650 MG CR tablet Commonly known as: TYLENOL Take 1,300 mg by mouth every 8 (eight) hours as needed for pain.   albuterol 108 (90 Base) MCG/ACT inhaler Commonly known as: VENTOLIN HFA Inhale 2 puffs into the lungs every 6 (six) hours as needed for wheezing or shortness of breath.   Apixaban Starter Pack (10mg  and 5mg ) Commonly known as: ELIQUIS STARTER PACK Take as directed on package: start with two-5mg  tablets twice daily for 7 days. On day 8, switch to one-5mg  tablet twice daily.   atorvastatin 80 MG tablet Commonly known as: LIPITOR Take 1 tablet (80 mg total) by mouth daily.   clopidogrel 75 MG tablet Commonly known as:  PLAVIX Take 1 tablet (75 mg total) by mouth daily with breakfast.   ergocalciferol 1.25 MG (50000 UT) capsule Commonly known as: VITAMIN D2 Take 1 capsule (50,000 Units total) by mouth once a week. What changed: when to take this   isosorbide mononitrate 30 MG 24 hr tablet Commonly known as: IMDUR Take 1 tablet (30 mg total) by mouth daily.   losartan 50 MG tablet Commonly known as: COZAAR Take 1 tablet (50 mg total) by mouth daily.   metFORMIN 500 MG tablet Commonly known as: GLUCOPHAGE Take 1 tablet (500 mg total) by mouth daily with breakfast. Restart on 10/21/2020 What changed: additional instructions   ondansetron 8 MG tablet Commonly known as: ZOFRAN Take 1 tablet (8 mg total) by mouth every 8 (eight) hours as needed for nausea or vomiting.   pantoprazole 40 MG tablet Commonly known as: PROTONIX Take 1 tablet (40 mg total) by mouth daily. What changed: when to take this   potassium chloride SA 20 MEQ tablet Commonly known as: KLOR-CON Take 1 tablet by mouth twice daily   torsemide 20 MG tablet Commonly known as: DEMADEX Take 20 mg by mouth 2 (two) times daily.   Vemlidy 25 MG Tabs Generic drug: Tenofovir Alafenamide Fumarate Take 25 mg by mouth daily.      Allergies  Allergen Reactions  . Crestor [Rosuvastatin Calcium] Other (See Comments)  Joint aches    Follow-up Information    Isaac Bliss, Rayford Halsted, MD. Schedule an appointment as soon as possible for a visit in 1 week(s).   Specialty: Internal Medicine Why: Hospital follow up Contact information: [place] [address] [phone]        Lorretta Harp, MD. Schedule an appointment as soon as possible for a visit in 1 week(s).   Specialties: Cardiology, Radiology Why: Hospital follow up Contact information: [address] [place] [address] [phone]        Orson Slick, MD. Schedule an appointment as soon as possible for a visit in  1 week(s).   Specialty: Hematology and Oncology Why: Hospital follow up Contact information: [address] [place] [address] [phone]                The results of significant diagnostics from this hospitalization (including imaging, microbiology, ancillary and laboratory) are listed below for reference.    Significant Diagnostic Studies: CT CORONARY MORPH W/CTA COR W/SCORE W/CA W/CM &/OR WO/CM  Addendum Date: 10/18/2020   ADDENDUM REPORT: 10/18/2020 18:29 HISTORY: Chest pain/anginal equiv, 8yr CHD risk 10-20%, not treadmill candidate EXAM: Cardiac/Coronary CT TECHNIQUE: The patient was scanned on a [employer]. PROTOCOL: A 120 kV prospective scan was triggered in the descending thoracic aorta at 111 HU's. Axial non-contrast 3 mm slices were carried out through the heart. The data set was analyzed on a dedicated work station and scored using the [place]. Gantry rotation speed was 250 msecs and collimation was 0.6 mm. Beta blockade and 0.8 mg of sl NTG was given. The 3D data set was reconstructed in 5% intervals of 35-75% of the R-R cycle. Diastolic phases were analyzed on a dedicated work station using MPR, MIP and VRT modes. The patient received 74mL OMNIPAQUE IOHEXOL 350 MG/ML SOLN of contrast. FINDINGS: Coronary calcium score: The patient's coronary artery calcium score is 135, which places the patient in the 60 percentile. Coronary arteries: Two separate ostia for left anterior descending and left circumflex coronary origins. Normal right coronary anatomy. Co-dominance Right Coronary Artery: Normal caliber vessel. There is a portion of heavily calcified mixed plaque in the proximal RCA with >70% stenosis. There is then a small caliber portion of the proximal to mid RCA with likely completely occluded mid RCA. The distal RCA appears to reconstitute via collaterals. Left Anterior Descending Coronary Artery: Arises directly from [facility] of aortic root. Normal caliber  vessel. There is mixed calcified and noncalcified plaque just proximal to D1, with 1-24% stenosis. Gives rise to 2 diagonal branches. Distal LAD wraps apex Left Circumflex Artery: Arises directly from [facility] of aortic root. normal caliber vessel. Trivial plaque without significant stenosis. Gives rise to large OM branch. Aorta: Moderately enlarged, 48 mm at the mid ascending aorta (level of the PA bifurcation) measured double oblique. No calcifications. No dissection. Aortic Valve: No calcifications. Trileaflet. Other findings: Normal pulmonary vein drainage into the left atrium. Normal left atrial appendage without a thrombus. Enlarged pulmonary artery size (3.8 cm) Moderate pericardial effusion Evidence of pulmonary embolism, most prominent in the RPA IMPRESSION: 1. Evidence of pulmonary embolism. Dr. Gwenlyn Found notified, patient has been sent to ER for evaluation. Enlarged PA size. 2. Severe CAD, CADRADS = 4. CT FFR will be reported separately. Likely CTO mid RCA with scattered nonobstructive disease in the left circulation. 3. Coronary calcium score of 135. This was 2 percentile for age and sex matched control.  4. Co dominant circulation, with individual ostia for the LAD and LCX arising from the [facility]. 5.  Moderate pericardial effusion 6.  Moderately enlarged ascending aorta. Findings communicated with Dr. Gwenlyn Found. Electronically Signed   By: Buford Dresser M.D.   On: 10/18/2020 18:29   Result Date: 10/18/2020 EXAM: OVER-READ INTERPRETATION  CT CHEST The following report is an over-read performed by radiologist Dr. Vinnie Langton of [facility], [place] on 10/18/2020. This over-read does not include interpretation of cardiac or coronary anatomy or pathology. The coronary calcium score/coronary CTA interpretation by the cardiologist is attached. COMPARISON:  None. FINDINGS: Aortic atherosclerosis with aneurysmal dilatation of the ascending thoracic aorta (4.8 cm in diameter). Small to moderate pericardial  effusion. Multiple filling defects within the pulmonary arterial tree bilaterally, compatible with pulmonary embolism. Dilatation of the pulmonic trunk (3.9 cm in diameter). Moderate right-sided pleural effusion lying dependently. Within the visualized portions of the thorax there are no suspicious appearing pulmonary nodules or masses, there is no acute consolidative airspace disease, no left pleural effusion, no pneumothorax and no lymphadenopathy. Visualized portions of the upper abdomen are unremarkable. There are no aggressive appearing lytic or blastic lesions noted in the visualized portions of the skeleton. IMPRESSION: 1. Study is positive for bilateral pulmonary embolism which appear segmental in size. 2. Ground-glass attenuation in the right lower lobe in a distribution of an embolus. Although much of this may be related atelectasis, the possibility of some degree of alveolar hemorrhage from pulmonary infarction is not excluded. There is also a moderate right-sided pleural effusion lying dependently. 3. Small to moderate pericardial effusion. 4. Aortic atherosclerosis with aneurysmal dilatation of the ascending thoracic aorta (4.8 cm in diameter). Ascending thoracic aortic aneurysm. Recommend semi-annual imaging followup by CTA or MRA and referral to cardiothoracic surgery if not already obtained. This recommendation follows 2010 ACCF/AHA/AATS/ACR/ASA/SCA/SCAI/SIR/STS/SVM Guidelines for the Diagnosis and Management of Patients With Thoracic Aortic Disease. Circulation. 2010; 121: D149-F026. Aortic aneurysm NOS (ICD10-I71.9). 5. Dilatation of the pulmonic trunk (3.9 cm in diameter), which may suggest pulmonary arterial hypertension. These results were called by telephone at the time of interpretation on 10/18/2020 at 1:50 pm to provider [facility], who verbally acknowledged these results. Electronically Signed: By: Vinnie Langton M.D. On: 10/18/2020 13:51   CT CORONARY FRACTIONAL FLOW RESERVE DATA  PREP  Result Date: 10/19/2020 EXAM: CT FFR ANALYSIS CLINICAL DATA:  Chest pain, nonspecific FINDINGS: FFRct analysis was performed on the original cardiac CT angiogram dataset. Diagrammatic representation of the FFRct analysis is provided in a separate PDF document in PACS. This dictation was created using the PDF document and an interactive 3D model of the results. 3D model is not available in the EMR/PACS. Normal FFR range is >0.80. 1. LAD: No significant stenosis in proximal portion. Gradual decrease in FFR to significant in distal LAD without significant stenosis. Proximal FFR = 0.99, Mid FFR = 0.85, Distal FFR = 0.65 2. LCX: No significant stenosis. Proximal FFR = 0.95, Distal FFR = 0.94. 3. RCA: Modeled chronic CTO in mid RCA. Proximal FFR = 0.93, CTO in mid RCA and beyond. IMPRESSION: 1. CT FFR analysis shows CTO in mid RCA and no focal significant stenosis in left system. No significant left main/likely separate ostia for LAD and LCx. Distant LAD gradually reaches FFR significance but without a focal stenosis. Electronically Signed   By: Buford Dresser M.D.   On: 10/19/2020 14:05   ECHOCARDIOGRAM COMPLETE  Result Date: 10/19/2020    ECHOCARDIOGRAM REPORT   Patient Name:  Samuel Butler Date of Exam: 10/19/2020 Medical Rec #:  [id number]       Height:       71.0 in Accession #:    [id number]      Weight:       236.1 lb Date of Birth:  31-Jan-1961       BSA:          2.263 m Patient Age:    2 years        BP:           125/94 mmHg Patient Gender: M               HR:           92 bpm. Exam Location:  Inpatient Procedure: 2D Echo Indications:    Pulmonary Embolus I26.99  History:        Patient has prior history of Echocardiogram examinations, most                 recent 08/05/2020. Risk Factors:Dyslipidemia, Diabetes and                 Hypertension.  Sonographer:    Mikki Santee RDCS (AE) Referring Phys: [id number] [place]  1. Compared to previous echo IVC is  dilated. Ascending aorta is unchanged.  2. Left ventricular ejection fraction, by estimation, is 55 to 60%. The left ventricle has normal function. The left ventricle has no regional wall motion abnormalities. There is severe left ventricular hypertrophy. Indeterminate diastolic filling due to E-A fusion.  3. Right ventricular systolic function is normal. The right ventricular size is normal. There is mildly elevated pulmonary artery systolic pressure.  4. Left atrial size was moderately dilated.  5. Right atrial size was mildly dilated.  6. A small pericardial effusion is present.  7. The mitral valve is normal in structure. Trivial mitral valve regurgitation.  8. The aortic valve is normal in structure. Aortic valve regurgitation is mild to moderate.  9. Aortic dilatation noted. There is mild dilatation of the aortic root, measuring 41 mm. There is moderate dilatation of the ascending aorta, measuring 47 mm. 10. The inferior vena cava is dilated in size with <50% respiratory variability, suggesting right atrial pressure of 15 mmHg. FINDINGS  Left Ventricle: Left ventricular ejection fraction, by estimation, is 55 to 60%. The left ventricle has normal function. The left ventricle has no regional wall motion abnormalities. The left ventricular internal cavity size was normal in size. There is  severe left ventricular hypertrophy. Indeterminate diastolic filling due to E-A fusion. Right Ventricle: The right ventricular size is normal. No increase in right ventricular wall thickness. Right ventricular systolic function is normal. There is mildly elevated pulmonary artery systolic pressure. The tricuspid regurgitant velocity is 2.82  m/s, and with an assumed right atrial pressure of 8 mmHg, the estimated right ventricular systolic pressure is 02.7 mmHg. Left Atrium: Left atrial size was moderately dilated. Right Atrium: Right atrial size was mildly dilated. Pericardium: A small pericardial effusion is present. Mitral  Valve: The mitral valve is normal in structure. Trivial mitral valve regurgitation. Tricuspid Valve: The tricuspid valve is normal in structure. Tricuspid valve regurgitation is mild. Aortic Valve: The aortic valve is normal in structure. Aortic valve regurgitation is mild to moderate. Aortic regurgitation PHT measures 537 msec. Pulmonic Valve: The pulmonic valve was normal in structure. Pulmonic valve regurgitation is not visualized. Aorta: Aortic dilatation noted. There is mild dilatation of the aortic root, measuring 41  mm. There is moderate dilatation of the ascending aorta, measuring 47 mm. Venous: The inferior vena cava is dilated in size with less than 50% respiratory variability, suggesting right atrial pressure of 15 mmHg. IAS/Shunts: No atrial level shunt detected by color flow Doppler.  LEFT VENTRICLE PLAX 2D LVIDd:         4.70 cm  Diastology LVIDs:         3.20 cm  LV e' medial:    4.03 cm/s LV PW:         1.90 cm  LV E/e' medial:  22.3 LV IVS:        1.80 cm  LV e' lateral:   4.47 cm/s LVOT diam:     2.60 cm  LV E/e' lateral: 20.1 LV SV:         93 LV SV Index:   41 LVOT Area:     5.31 cm  RIGHT VENTRICLE RV S prime:     12.80 cm/s TAPSE (M-mode): 1.9 cm LEFT ATRIUM           Index       RIGHT ATRIUM           Index LA diam:      3.10 cm 1.37 cm/m  RA Area:     21.70 cm LA Vol (A2C): 88.0 ml 38.89 ml/m RA Volume:   68.80 ml  30.41 ml/m LA Vol (A4C): 92.3 ml 40.79 ml/m  AORTIC VALVE LVOT Vmax:   110.00 cm/s LVOT Vmean:  80.600 cm/s LVOT VTI:    0.176 m AI PHT:      537 msec  AORTA Ao Root diam: 4.10 cm MITRAL VALVE                TRICUSPID VALVE MV Area (PHT): 3.06 cm     TR Peak grad:   31.8 mmHg MV Decel Time: 248 msec     TR Vmax:        282.00 cm/s MV E velocity: 90.00 cm/s MV A velocity: 111.00 cm/s  SHUNTS MV E/A ratio:  0.81         Systemic VTI:  0.18 m                             Systemic Diam: 2.60 cm Dorris Carnes MD Electronically signed by Dorris Carnes MD Signature Date/Time:  10/19/2020/12:44:55 PM    Final     Microbiology: Recent Results (from the past 240 hour(s))  Resp Panel by RT-PCR (Flu A&B, Covid) Nasopharyngeal Swab     Status: None   Collection Time: 10/18/20  5:25 PM   Specimen: Nasopharyngeal Swab; Nasopharyngeal(NP) swabs in vial transport medium  Result Value Ref Range Status   SARS Coronavirus 2 by RT PCR NEGATIVE NEGATIVE Final    Comment: (NOTE) SARS-CoV-2 target nucleic acids are NOT DETECTED.  The SARS-CoV-2 RNA is generally detectable in upper respiratory specimens during the acute phase of infection. The lowest concentration of SARS-CoV-2 viral copies this assay can detect is 138 copies/mL. A negative result does not preclude SARS-Cov-2 infection and should not be used as the sole basis for treatment or other patient management decisions. A negative result may occur with  improper specimen collection/handling, submission of specimen other than nasopharyngeal swab, presence of viral mutation(s) within the areas targeted by this assay, and inadequate number of viral copies(<138 copies/mL). A negative result must be combined with clinical observations, patient history, and epidemiological information.  The expected result is Negative.  Fact Sheet for Patients:  EntrepreneurPulse.com.au  Fact Sheet for Healthcare Providers:  IncredibleEmployment.be  This test is no t yet approved or cleared by the [place] FDA and  has been authorized for detection and/or diagnosis of SARS-CoV-2 by FDA under an Emergency Use Authorization (EUA). This EUA will remain  in effect (meaning this test can be used) for the duration of the COVID-19 declaration under Section 564(b)(1) of the Act, 21 U.S.C.section 360bbb-3(b)(1), unless the authorization is terminated  or revoked sooner.       Influenza A by PCR NEGATIVE NEGATIVE Final   Influenza B by PCR NEGATIVE NEGATIVE Final    Comment: (NOTE) The Xpert Xpress  SARS-CoV-2/FLU/RSV plus assay is intended as an aid in the diagnosis of influenza from Nasopharyngeal swab specimens and should not be used as a sole basis for treatment. Nasal washings and aspirates are unacceptable for Xpert Xpress SARS-CoV-2/FLU/RSV testing.  Fact Sheet for Patients: EntrepreneurPulse.com.au  Fact Sheet for Healthcare Providers: IncredibleEmployment.be  This test is not yet approved or cleared by the [place] FDA and has been authorized for detection and/or diagnosis of SARS-CoV-2 by FDA under an Emergency Use Authorization (EUA). This EUA will remain in effect (meaning this test can be used) for the duration of the COVID-19 declaration under Section 564(b)(1) of the Act, 21 U.S.C. section 360bbb-3(b)(1), unless the authorization is terminated or revoked.  Performed at [facility], [place] [address] [place], [place] [address]      Labs: Basic Metabolic Panel: Recent Labs  Lab 10/18/20 1537 10/19/20 0031 10/20/20 0311  NA 140 139 140  K 4.3 3.7 3.5  CL 104 104 105  CO2 23 23 26   GLUCOSE 82 111* 96  BUN 24* 21* 19  CREATININE 1.29* 1.24 1.24  CALCIUM 7.9* 7.8* 7.2*   Liver Function Tests: No results for input(s): AST, ALT, ALKPHOS, BILITOT, PROT, ALBUMIN in the last 168 hours. No results for input(s): LIPASE, AMYLASE in the last 168 hours. No results for input(s): AMMONIA in the last 168 hours. CBC: Recent Labs  Lab 10/18/20 1537 10/19/20 0031 10/20/20 0311  WBC 6.7 5.3 4.1  NEUTROABS 5.2  --   --   HGB 13.6 13.1 11.3*  HCT 43.1 38.1* 33.8*  MCV 97.7 92.0 93.1  PLT 120* 111* 102*   Cardiac Enzymes: No results for input(s): CKTOTAL, CKMB, CKMBINDEX, TROPONINI in the last 168 hours. BNP: BNP (last 3 results) Recent Labs    10/18/20 1725  BNP 638.2*    ProBNP (last 3 results) No results for input(s): PROBNP in the last 8760 hours.  CBG: Recent Labs  Lab 10/19/20 0742 10/19/20 1146  10/19/20 1630 10/19/20 2135 10/20/20 0612  GLUCAP 102* 89 95 103* 98       Signed:  Shatira Dobosz  [facility] 10/20/2020, 9:02 AM

## 2020-10-20 NOTE — Progress Notes (Signed)
ANTICOAGULATION CONSULT NOTE - Follow Up Consult  Pharmacy Consult for Heparin -> Apixaban Indication: pulmonary embolus  Assessment: Anticoag: PE in the setting of kidney cancer. Pt has been on heparin, now switching to apixaban.   Goal of Therapy:  Monitor platelets by anticoagulation protocol: Yes   Plan:  Apixaban 10mg  po BID x 7 days then 5mg  BID daily F/u CBC  Sherlon Handing, PharmD, BCPS Please see amion for complete clinical pharmacist phone list 10/20/2020,6:44 AM

## 2020-10-24 ENCOUNTER — Encounter: Payer: Self-pay | Admitting: Physician Assistant

## 2020-10-24 ENCOUNTER — Ambulatory Visit (INDEPENDENT_AMBULATORY_CARE_PROVIDER_SITE_OTHER): Payer: BC Managed Care – PPO | Admitting: Physician Assistant

## 2020-10-24 ENCOUNTER — Telehealth: Payer: Self-pay

## 2020-10-24 VITALS — BP 104/64 | HR 92 | Ht 71.0 in | Wt 235.8 lb

## 2020-10-24 DIAGNOSIS — R140 Abdominal distension (gaseous): Secondary | ICD-10-CM

## 2020-10-24 DIAGNOSIS — R1084 Generalized abdominal pain: Secondary | ICD-10-CM | POA: Diagnosis not present

## 2020-10-24 DIAGNOSIS — R197 Diarrhea, unspecified: Secondary | ICD-10-CM | POA: Diagnosis not present

## 2020-10-24 DIAGNOSIS — B181 Chronic viral hepatitis B without delta-agent: Secondary | ICD-10-CM

## 2020-10-24 DIAGNOSIS — Z86711 Personal history of pulmonary embolism: Secondary | ICD-10-CM

## 2020-10-24 DIAGNOSIS — G8929 Other chronic pain: Secondary | ICD-10-CM

## 2020-10-24 DIAGNOSIS — Z7901 Long term (current) use of anticoagulants: Secondary | ICD-10-CM

## 2020-10-24 DIAGNOSIS — R1314 Dysphagia, pharyngoesophageal phase: Secondary | ICD-10-CM | POA: Diagnosis not present

## 2020-10-24 MED ORDER — DICYCLOMINE HCL 10 MG PO CAPS: 10.0000 mg | ORAL_CAPSULE | Freq: Four times a day (QID) | ORAL | 0 refills | Status: DC

## 2020-10-24 NOTE — Patient Instructions (Addendum)
If you are age 60 or older, your body mass index should be between 23-30. Your Body mass index is 32.89 kg/m. If this is out of the aforementioned range listed, please consider follow up with your Primary Care Provider.  If you are age 85 or younger, your body mass index should be between 19-25. Your Body mass index is 32.89 kg/m. If this is out of the aformentioned range listed, please consider follow up with your Primary Care Provider.   We have sent the following medications to your pharmacy for you to pick up at your convenience: Dicyclomine 10 mg   You have been scheduled for a Barium Esophogram at [facility] (1st floor of the hospital) on 11/07/20 at 9:00am. Please arrive 15 minutes prior to your appointment for registration. Make certain not to have anything to eat or drink 3 hours prior to your test. If you need to reschedule for any reason, please contact radiology at [phone] to do so. __________________________________________________________________ A barium swallow is an examination that concentrates on views of the esophagus. This tends to be a double contrast exam (barium and two liquids which, when combined, create a gas to distend the wall of the oesophagus) or single contrast (non-ionic iodine based). The study is usually tailored to your symptoms so a good history is essential. Attention is paid during the study to the form, structure and configuration of the esophagus, looking for functional disorders (such as aspiration, dysphagia, achalasia, motility and reflux) EXAMINATION You may be asked to change into a gown, depending on the type of swallow being performed. A radiologist and radiographer will perform the procedure. The radiologist will advise you of the type of contrast selected for your procedure and direct you during the exam. You will be asked to stand, sit or lie in several different positions and to hold a small amount of fluid in your mouth before being  asked to swallow while the imaging is performed .In some instances you may be asked to swallow barium coated marshmallows to assess the motility of a solid food bolus. The exam can be recorded as a digital or video fluoroscopy procedure. POST PROCEDURE It will take 1-2 days for the barium to pass through your system. To facilitate this, it is important, unless otherwise directed, to increase your fluids for the next 24-48hrs and to resume your normal diet.  This test typically takes about 30 minutes to perform. __________________________________________________________________________________   Your provider has requested that you go to the basement level for lab work before leaving today. Press "B" on the elevator. The lab is located at the first door on the left as you exit the elevator.  Thank you for choosing me and [facility].  Ellouise Newer, PA-C

## 2020-10-24 NOTE — Progress Notes (Signed)
Reviewed and agree with management plan.  Victormanuel Mclure T. Venna Berberich, MD FACG [facility]  

## 2020-10-24 NOTE — Telephone Encounter (Signed)
Pharm  Pt just started on eliquis for PE  10/18/20 found on cardiac CTA  Along with amyloid -  Please give recommendations

## 2020-10-24 NOTE — Telephone Encounter (Signed)
Patient with diagnosis of PE on 10/18/20 on Eliquis for anticoagulation.    Procedure: colonoscopy and endoscopy Date of procedure: TBD  CrCl 58mL/min using adjusted body weight Platelet count 102K  Recommend that pt have procedures moved back if they are routine screening procedures due to recent PE. Ideally, he should continue uninterrupted anticoagulation for 3-6 months. If these procedures are more urgent, will need MD input on anticoagulation hold as this will need to be limited as much as possible.

## 2020-10-24 NOTE — Telephone Encounter (Signed)
[facility] Pre-operative Risk Assessment     Request for surgical clearance:     Endoscopy Procedure  What type of surgery is being performed?     Colonoscopy and Endoscopy   When is this surgery scheduled?    Pending   What type of clearance is required ?   Pharmacy  Are there any medications that need to be held prior to surgery and how long? 2 days for the [facility]   Practice name and name of physician performing surgery?      [facility]  What is your office phone and fax number?      Phone- [phone]  [phone]  Anesthesia type (None, local, MAC, general) ?       MAC  We would like to know at what would the patient be able to hold his Eliquis for the above mentioned procedures.

## 2020-10-24 NOTE — Progress Notes (Signed)
Chief Complaint: Stomach pain, gas and burping  HPI:    [name] is a 59 year old male with a past medical history as listed below including amyloidosis of the kidney and hepatitis B as well as recent PE started on Eliquis 10/19/2020, who was referred to me by Isaac Bliss, [place]* for a complaint of stomach pain, gas and burping.     08/29/2020 ultrasound of the abdomen completed with the elastography given patient's history of chronic hepatitis B.  He had a cirrhotic appearing liver with nodular margins and calcified granuloma within the liver and spleen.  Though K PA was 3.3 and less than 5 equals high for probability of being normal.    09/28/2020 patient was seen in the cancer center by Dr. Lorenso Courier for his amyloidosis.  He had bone biopsy in October showing plasma cell neoplasm with focal amyloid deposits.  Scheduled to start chemotherapy 09/29/2020.    10/04/2020 Dr. Lorenso Courier had noted that he thought a colonoscopy and endoscopy were a good idea given his chronic abdominal pain    10/18/2020 patient admitted to the hospital for acute pulmonary embolism.  Patient was transitioned to Eliquis.    10/19/2020 echo with normal LVEF at 55-60%.    10/20/2020 CBC with a hemoglobin of 11.3, platelets low at 102.  BMP with calcium low at 7.2 and otherwise normal.    Today, patient presents to clinic accompanied by his wife.  As you can see above patient has a lot of new diagnoses and has now started chemotherapy as of 3 weeks ago.  Tells me that he has a lot of swelling in his body  And in his stomach as well as in his legs which is related to this.      Explains his abdominal pain and that he has been diagnosed with IBS-D many years ago, he thinks back in 2012 when he had his last colonoscopy at what sounds like [facility].  Tells me that he will have abdominal cramping and loose stool which is chronic for him but this all seems to be worse over the past few months.  He had a stent placed in his  right leg for PAD on August 16 and then developed a hernia in his groin on this side.  Shortly thereafter he developed swelling and they found proteins which were from his "kidneys spilling over" and they diagnosed him with amyloidosis.  They then shortly after diagnosed him with cancer.       Tells me that since all this happened he has been having trouble eating telling me that he has a lot of gurgling in his stomach and a lot of air as well as just varying appetite related to his chemo doses.  Also describes that he feels like he just "cannot hold things in".  Tells me that he is having only diarrhea where before his IBS would occasionally give him days of solid stool.  Also describes generalized abdominal cramping which is daily and has also been experiencing occasional bright red blood per rectum.    Along with this his wife adds that he has had trouble swallowing things and that they get stuck in his throat and he has to regurgitate them back out.  He is currently on Protonix 40 mg once a day.    Denies fever, chills, weight loss or symptoms that awaken him from sleep.  Past Medical History:  Diagnosis Date  . DDD (degenerative disc disease), lumbar    per his report  .  Diabetes mellitus without complication ([place])   . Gout 04/16/2013  . Hyperlipidemia   . Hypertension   . IBD (inflammatory bowel disease) 04/05/2016  . Tobacco use disorder 03/28/2015    Past Surgical History:  Procedure Laterality Date  . ABDOMINAL AORTOGRAM W/LOWER EXTREMITY Right 06/26/2020   Procedure: ABDOMINAL AORTOGRAM W/LOWER EXTREMITY;  Surgeon: Lorretta Harp, MD;  Location: [facility];  Service: Cardiovascular;  Laterality: Right;  . COLONOSCOPY    . PERIPHERAL VASCULAR INTERVENTION  06/26/2020   Procedure: PERIPHERAL VASCULAR INTERVENTION;  Surgeon: Lorretta Harp, MD;  Location: [facility];  Service: Cardiovascular;;  Right SFA  . TONSILLECTOMY      Current Outpatient Medications   Medication Sig Dispense Refill  . acetaminophen (TYLENOL) 650 MG CR tablet Take 1,300 mg by mouth every 8 (eight) hours as needed for pain.     Marland Kitchen albuterol (VENTOLIN HFA) 108 (90 Base) MCG/ACT inhaler Inhale 2 puffs into the lungs every 6 (six) hours as needed for wheezing or shortness of breath. 8 g 0  . APIXABAN (ELIQUIS) VTE STARTER PACK (10MG  AND 5MG ) Take as directed on package: start with two-5mg  tablets twice daily for 7 days. On day 8, switch to one-5mg  tablet twice daily. 1 each 0  . atorvastatin (LIPITOR) 80 MG tablet Take 1 tablet (80 mg total) by mouth daily. 90 tablet 2  . clopidogrel (PLAVIX) 75 MG tablet Take 1 tablet (75 mg total) by mouth daily with breakfast. 90 tablet 3  . ergocalciferol (VITAMIN D2) 1.25 MG (50000 UT) capsule Take 1 capsule (50,000 Units total) by mouth once a week. (Patient taking differently: Take 50,000 Units by mouth every Thursday. ) 12 capsule 0  . isosorbide mononitrate (IMDUR) 30 MG 24 hr tablet Take 1 tablet (30 mg total) by mouth daily. 90 tablet 1  . losartan (COZAAR) 50 MG tablet Take 1 tablet (50 mg total) by mouth daily. 90 tablet 3  . metFORMIN (GLUCOPHAGE) 500 MG tablet Take 1 tablet (500 mg total) by mouth daily with breakfast. Restart on 10/21/2020    . ondansetron (ZOFRAN) 8 MG tablet Take 1 tablet (8 mg total) by mouth every 8 (eight) hours as needed for nausea or vomiting. 30 tablet 0  . pantoprazole (PROTONIX) 40 MG tablet Take 1 tablet (40 mg total) by mouth daily. (Patient taking differently: Take 40 mg by mouth daily before breakfast. ) 90 tablet 1  . potassium chloride SA (KLOR-CON) 20 MEQ tablet Take 1 tablet by mouth twice daily (Patient taking differently: Take 20 mEq by mouth 2 (two) times daily. ) 28 tablet 0  . torsemide (DEMADEX) 20 MG tablet Take 20 mg by mouth 2 (two) times daily.    . VEMLIDY 25 MG TABS Take 25 mg by mouth daily.      No current facility-administered medications for this visit.    Allergies as of 10/24/2020  - Review Complete 10/18/2020  Allergen Reaction Noted  . Crestor [rosuvastatin calcium] Other (See Comments) 02/16/2018    Family History  Problem Relation Age of Onset  . Hypertension Father   . Diabetes Father   . Stroke Father     Social History   Socioeconomic History  . Marital status: Married    Spouse name: Not on file  . Number of children: Not on file  . Years of education: Not on file  . Highest education level: Not on file  Occupational History  . Not on file  Tobacco Use  .  Smoking status: Former Smoker    Types: Cigarettes    Quit date: 10/23/2016    Years since quitting: 4.0  . Smokeless tobacco: Never Used  . Tobacco comment: per patient 5 cigarettes a day   Vaping Use  . Vaping Use: Never used  Substance and Sexual Activity  . Alcohol use: Yes    Comment: occasional  . Drug use: No  . Sexual activity: Not on file  Other Topics Concern  . 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 Worried About Charity fundraiser in the Last Year: Sometimes true  . Ran Out of Food in the Last Year: Never true  Transportation Needs: No Transportation Needs  . Lack of Transportation (Medical): No  . Lack of Transportation (Non-Medical): No  Physical Activity: Not on file  Stress: Not on file  Social Connections: Not on file  Intimate Partner Violence: Not on file    Review of Systems:    Constitutional: No weight loss, fever or chills Skin: No rash  Cardiovascular: No chest pain  Respiratory: No SOB  Gastrointestinal: See HPI and otherwise negative Genitourinary: No dysuria  Neurological: No headache, dizziness or syncope Musculoskeletal: No new muscle or  joint pain Hematologic: No bruising Psychiatric: No history of depression or anxiety   Physical Exam:  Vital signs: BP 104/64   Pulse 92   Ht 5\' 11"  (1.803 m)   Wt 235 lb 12.8 oz (107 kg)   BMI 32.89 kg/m   Constitutional:   Pleasant AA male appears to be in NAD, Well developed, Well nourished, alert and cooperative Head:  Normocephalic and atraumatic. Eyes:   PEERL, EOMI. No icterus. Conjunctiva pink. Ears:  Normal auditory acuity. Neck:  Supple Throat: Oral cavity and pharynx without inflammation, swelling or lesion.  Respiratory: Respirations even and unlabored. Lungs clear to auscultation bilaterally.   No wheezes, crackles, or rhonchi.  Cardiovascular: Normal S1, S2. No MRG. Regular rate and rhythm. No peripheral edema, cyanosis or pallor.  Gastrointestinal:  Soft, nondistended, nontender. No rebound or guarding. Normal bowel sounds. No appreciable masses or hepatomegaly. Rectal:  Not performed.  Msk:  Symmetrical without gross deformities. Without edema, no deformity or joint abnormality.  Neurologic:  Alert and  oriented x4;  grossly normal neurologically.  Skin:   Dry and intact without significant lesions or rashes. Psychiatric: Demonstrates good judgement and reason without abnormal affect or behaviors.  RELEVANT LABS AND IMAGING: CBC    Component Value Date/Time   WBC 4.1 10/20/2020 0311   RBC 3.63 (L) 10/20/2020 0311   HGB 11.3 (L) 10/20/2020 0311   HGB 13.2 10/13/2020 0816   HGB 14.2 06/23/2020 1034   HCT 33.8 (L) 10/20/2020 0311   HCT 43.7 06/23/2020 1034   PLT 102 (L) 10/20/2020 0311   PLT 163 10/13/2020 0816   PLT 351 06/23/2020 1034   MCV 93.1 10/20/2020 0311   MCV 90 06/23/2020 1034   MCH 31.1 10/20/2020 0311   MCHC 33.4 10/20/2020 0311   RDW 15.1 10/20/2020 0311   RDW 13.3 06/23/2020 1034   LYMPHSABS 1.0 10/18/2020 1537  MONOABS 0.5 10/18/2020 1537   EOSABS 0.0 10/18/2020 1537   BASOSABS 0.0 10/18/2020 1537    CMP     Component Value  Date/Time   NA 140 10/20/2020 0311   NA 145 (H) 09/26/2020 1045   K 3.5 10/20/2020 0311   CL 105 10/20/2020 0311   CO2 26 10/20/2020 0311   GLUCOSE 96 10/20/2020 0311   BUN 19 10/20/2020 0311   BUN 15 09/26/2020 1045   CREATININE 1.24 10/20/2020 0311   CREATININE 1.09 10/13/2020 0816   CREATININE 1.03 08/22/2020 0955   CALCIUM 7.2 (L) 10/20/2020 0311   CALCIUM 7.6 (L) 08/04/2020 0859   PROT 4.3 (L) 10/13/2020 0816   ALBUMIN 1.5 (L) 10/13/2020 0816   AST 16 10/13/2020 0816   ALT 15 10/13/2020 0816   ALT 30 08/22/2020 0955   ALKPHOS 80 10/13/2020 0816   BILITOT 0.6 10/13/2020 0816   GFRNONAA >60 10/20/2020 0311   GFRNONAA >60 10/13/2020 0816   GFRNONAA 79 08/22/2020 0955   GFRAA 82 09/26/2020 1045   GFRAA 91 08/22/2020 0955    Assessment: 1.  Generalized abdominal cramping pain: Chronic for the patient, some worse recently, apparently diagnosed with IBS-D in the past; consider most likely IBS 2.  Diarrhea: See above, though worse over the past few months and now no solid stools, patient has had recent hospitalization; consider infectious cause versus IBS-D versus other 3.  Chronic hepatitis B: Patient has followed with a physician in the past for this and had a recent elastography 08/29/2020 which was not compatible with cirrhosis 4.  Right inguinal hernia: Some pain from this when "it sticks out", but I do not believe chronic abdominal pain is from the source 5.  Dysphagia: Worse over the past few weeks; consider dysmotility or stricture versus other 6.  Recent PE 10/18/2020 on Eliquis  Plan: 1.  Discussed with patient that due to his multiple medical comorbidities including recent diagnosis of PE and addition of Eliquis to his med list there may be a lot of reasons for all of his symptoms.  I would recommend an EGD and colonoscopy at some point in the near future for further evaluation, especially since it has been almost 10 years since his last colonoscopy.  We will contact Dr.  Gwenlyn Found who prescribes his Eliquis and ask how long he would like [place] to wait before holding this medicine for 2 days.  Explained that typically it is at least 3 to 6 months before they want [place] to do this.  He verbalized understanding. 2.  In the meantime ordered a barium swallow with tablet for further evaluation of dysphagia 3.  Continue Pantoprazole 40 mg daily 4.  Ordered stool studies to include a GI pathogen panel 5.  Started the patient on Dicyclomine 10 mg 4 times daily, 20-30 minutes before meals and at bedtime #120 with 3 refills. 6.  Discussed that his abdominal swelling is likely from his amyloidosis and chemotherapy, though if this worsens could discuss some abdominal directed imaging such as a CT.  He did have an ultrasound back in October which was not compatible with cirrhosis via elastography. 7.  Patient to follow in clinic with [place] per recommendations after labs and imaging above.  Would recommend that he follow with his doctor who will be Dr. Fuller Plan from now on due to his multiple comorbidities and complicated medical status.  Ellouise Newer, PA-C [facility] 10/24/2020, 2:27 PM  Cc: Isaac Bliss, [place]*

## 2020-10-25 NOTE — Telephone Encounter (Signed)
Please call the patient and let him know that as expected recommendations are to wait at least 3 to 6 months after his recent PE until we can hold his blood thinner for procedures.  Please schedule him a follow-up appointment in 3 months with the physician he was assigned to yesterday so they can discuss next steps.  Hopefully he will have some relief after starting the medicine from our visit yesterday.  Thanks! JLL

## 2020-10-25 NOTE — Telephone Encounter (Signed)
Called patient and let him know that it is recommended that he wait 3-6 months before scheduling a procedure and that I would call back to make a follow up appointment when the schedule opened up in March.

## 2020-10-25 NOTE — Telephone Encounter (Signed)
   Primary Cardiologist: Quay Burow, MD  Chart reviewed as part of pre-operative protocol coverage.   The patient was recently diagnosed with PE found on CTA (10/18/20). He was started on eliquis.   Per our clinical pharmacist: Recommend that pt have procedures moved back if they are routine screening procedures due to recent PE. Ideally, he should continue uninterrupted anticoagulation for 3-6 months. If these procedures are more urgent, will need MD input on anticoagulation hold as this will need to be limited as much as possible.   I will route this recommendation to the requesting party via Epic fax function and remove from pre-op pool. Please call with questions.  If EGD/colonoscopy need to be completed prior to 3-6 months, please reach back out and we will consult with MD on hold.  Tami Lin Meggin Ola, PA 10/25/2020, 9:39 AM

## 2020-10-26 ENCOUNTER — Other Ambulatory Visit: Payer: BC Managed Care – PPO

## 2020-10-26 DIAGNOSIS — R1314 Dysphagia, pharyngoesophageal phase: Secondary | ICD-10-CM

## 2020-10-26 DIAGNOSIS — G8929 Other chronic pain: Secondary | ICD-10-CM

## 2020-10-26 DIAGNOSIS — R197 Diarrhea, unspecified: Secondary | ICD-10-CM

## 2020-10-26 DIAGNOSIS — R1084 Generalized abdominal pain: Secondary | ICD-10-CM

## 2020-10-26 DIAGNOSIS — R140 Abdominal distension (gaseous): Secondary | ICD-10-CM

## 2020-10-27 ENCOUNTER — Inpatient Hospital Stay (HOSPITAL_BASED_OUTPATIENT_CLINIC_OR_DEPARTMENT_OTHER): Payer: BC Managed Care – PPO | Admitting: Hematology and Oncology

## 2020-10-27 ENCOUNTER — Other Ambulatory Visit: Payer: Self-pay | Admitting: Hematology and Oncology

## 2020-10-27 ENCOUNTER — Inpatient Hospital Stay: Payer: BC Managed Care – PPO

## 2020-10-27 ENCOUNTER — Other Ambulatory Visit: Payer: Self-pay

## 2020-10-27 VITALS — HR 106

## 2020-10-27 VITALS — BP 109/87 | HR 109 | Temp 98.0°F | Resp 18 | Ht 71.0 in | Wt 236.7 lb

## 2020-10-27 DIAGNOSIS — Z800 Family history of malignant neoplasm of digestive organs: Secondary | ICD-10-CM | POA: Diagnosis not present

## 2020-10-27 DIAGNOSIS — R21 Rash and other nonspecific skin eruption: Secondary | ICD-10-CM | POA: Diagnosis not present

## 2020-10-27 DIAGNOSIS — E8581 Light chain (AL) amyloidosis: Secondary | ICD-10-CM

## 2020-10-27 DIAGNOSIS — Z8249 Family history of ischemic heart disease and other diseases of the circulatory system: Secondary | ICD-10-CM | POA: Diagnosis not present

## 2020-10-27 DIAGNOSIS — Z87891 Personal history of nicotine dependence: Secondary | ICD-10-CM | POA: Diagnosis not present

## 2020-10-27 DIAGNOSIS — Z5111 Encounter for antineoplastic chemotherapy: Secondary | ICD-10-CM | POA: Diagnosis present

## 2020-10-27 DIAGNOSIS — E785 Hyperlipidemia, unspecified: Secondary | ICD-10-CM | POA: Diagnosis not present

## 2020-10-27 DIAGNOSIS — K625 Hemorrhage of anus and rectum: Secondary | ICD-10-CM | POA: Diagnosis not present

## 2020-10-27 DIAGNOSIS — I700 Atherosclerosis of aorta: Secondary | ICD-10-CM | POA: Diagnosis not present

## 2020-10-27 DIAGNOSIS — E119 Type 2 diabetes mellitus without complications: Secondary | ICD-10-CM | POA: Diagnosis not present

## 2020-10-27 DIAGNOSIS — I2699 Other pulmonary embolism without acute cor pulmonale: Secondary | ICD-10-CM | POA: Diagnosis not present

## 2020-10-27 DIAGNOSIS — E859 Amyloidosis, unspecified: Secondary | ICD-10-CM | POA: Diagnosis not present

## 2020-10-27 DIAGNOSIS — R5383 Other fatigue: Secondary | ICD-10-CM | POA: Diagnosis not present

## 2020-10-27 DIAGNOSIS — N049 Nephrotic syndrome with unspecified morphologic changes: Secondary | ICD-10-CM

## 2020-10-27 DIAGNOSIS — Z823 Family history of stroke: Secondary | ICD-10-CM | POA: Diagnosis not present

## 2020-10-27 DIAGNOSIS — I10 Essential (primary) hypertension: Secondary | ICD-10-CM | POA: Diagnosis not present

## 2020-10-27 DIAGNOSIS — I209 Angina pectoris, unspecified: Secondary | ICD-10-CM | POA: Diagnosis not present

## 2020-10-27 DIAGNOSIS — Z79899 Other long term (current) drug therapy: Secondary | ICD-10-CM | POA: Diagnosis not present

## 2020-10-27 DIAGNOSIS — K589 Irritable bowel syndrome without diarrhea: Secondary | ICD-10-CM | POA: Diagnosis not present

## 2020-10-27 DIAGNOSIS — Z5112 Encounter for antineoplastic immunotherapy: Secondary | ICD-10-CM | POA: Diagnosis not present

## 2020-10-27 DIAGNOSIS — E854 Organ-limited amyloidosis: Secondary | ICD-10-CM | POA: Diagnosis not present

## 2020-10-27 DIAGNOSIS — R9431 Abnormal electrocardiogram [ECG] [EKG]: Secondary | ICD-10-CM | POA: Diagnosis not present

## 2020-10-27 DIAGNOSIS — Z833 Family history of diabetes mellitus: Secondary | ICD-10-CM | POA: Diagnosis not present

## 2020-10-27 DIAGNOSIS — Z7952 Long term (current) use of systemic steroids: Secondary | ICD-10-CM | POA: Diagnosis not present

## 2020-10-27 LAB — CBC WITH DIFFERENTIAL (CANCER CENTER ONLY)
Abs Immature Granulocytes: 0.01 10*3/uL (ref 0.00–0.07)
Basophils Absolute: 0 10*3/uL (ref 0.0–0.1)
Basophils Relative: 0 %
Eosinophils Absolute: 0 10*3/uL (ref 0.0–0.5)
Eosinophils Relative: 1 %
HCT: 39.3 % (ref 39.0–52.0)
Hemoglobin: 12.9 g/dL — ABNORMAL LOW (ref 13.0–17.0)
Immature Granulocytes: 0 %
Lymphocytes Relative: 26 %
Lymphs Abs: 0.9 10*3/uL (ref 0.7–4.0)
MCH: 31.2 pg (ref 26.0–34.0)
MCHC: 32.8 g/dL (ref 30.0–36.0)
MCV: 95.2 fL (ref 80.0–100.0)
Monocytes Absolute: 0.3 10*3/uL (ref 0.1–1.0)
Monocytes Relative: 9 %
Neutro Abs: 2.1 10*3/uL (ref 1.7–7.7)
Neutrophils Relative %: 64 %
Platelet Count: 218 10*3/uL (ref 150–400)
RBC: 4.13 MIL/uL — ABNORMAL LOW (ref 4.22–5.81)
RDW: 15.5 % (ref 11.5–15.5)
WBC Count: 3.3 10*3/uL — ABNORMAL LOW (ref 4.0–10.5)
nRBC: 0 % (ref 0.0–0.2)

## 2020-10-27 LAB — CMP (CANCER CENTER ONLY)
ALT: 16 U/L (ref 0–44)
AST: 20 U/L (ref 15–41)
Albumin: 1.3 g/dL — ABNORMAL LOW (ref 3.5–5.0)
Alkaline Phosphatase: 89 U/L (ref 38–126)
Anion gap: 6 (ref 5–15)
BUN: 17 mg/dL (ref 6–20)
CO2: 29 mmol/L (ref 22–32)
Calcium: 8 mg/dL — ABNORMAL LOW (ref 8.9–10.3)
Chloride: 106 mmol/L (ref 98–111)
Creatinine: 1.49 mg/dL — ABNORMAL HIGH (ref 0.61–1.24)
GFR, Estimated: 53 mL/min — ABNORMAL LOW (ref >60)
Glucose, Bld: 96 mg/dL (ref 70–99)
Potassium: 3.8 mmol/L (ref 3.5–5.1)
Sodium: 141 mmol/L (ref 135–145)
Total Bilirubin: 0.4 mg/dL (ref 0.3–1.2)
Total Protein: 4.3 g/dL — ABNORMAL LOW (ref 6.5–8.1)

## 2020-10-27 LAB — LACTATE DEHYDROGENASE: LDH: 322 U/L — ABNORMAL HIGH (ref 98–192)

## 2020-10-27 MED ORDER — SODIUM CHLORIDE 0.9 % IV SOLN
INTRAVENOUS | Status: DC
  Filled 2020-10-27: qty 250

## 2020-10-27 MED ORDER — DEXAMETHASONE 4 MG PO TABS
ORAL_TABLET | ORAL | Status: AC
  Filled 2020-10-27: qty 10

## 2020-10-27 MED ORDER — ACETAMINOPHEN 325 MG PO TABS
ORAL_TABLET | ORAL | Status: AC
  Filled 2020-10-27: qty 2

## 2020-10-27 MED ORDER — DEXAMETHASONE 4 MG PO TABS
40.0000 mg | ORAL_TABLET | Freq: Once | ORAL | Status: AC
  Administered 2020-10-27: 13:00:00 40 mg via ORAL

## 2020-10-27 MED ORDER — DARATUMUMAB-HYALURONIDASE-FIHJ 1800-30000 MG-UT/15ML ~~LOC~~ SOLN
1800.0000 mg | Freq: Once | SUBCUTANEOUS | Status: AC
  Administered 2020-10-27: 15:00:00 1800 mg via SUBCUTANEOUS
  Filled 2020-10-27: qty 15

## 2020-10-27 MED ORDER — SODIUM CHLORIDE 0.9 % IV SOLN
300.0000 mg/m2 | Freq: Once | INTRAVENOUS | Status: AC
  Administered 2020-10-27: 14:00:00 680 mg via INTRAVENOUS
  Filled 2020-10-27: qty 34

## 2020-10-27 MED ORDER — BORTEZOMIB CHEMO SQ INJECTION 3.5 MG (2.5MG/ML)
1.3000 mg/m2 | Freq: Once | INTRAMUSCULAR | Status: AC
  Administered 2020-10-27: 15:00:00 3 mg via SUBCUTANEOUS
  Filled 2020-10-27: qty 1.2

## 2020-10-27 MED ORDER — ACETAMINOPHEN 325 MG PO TABS
650.0000 mg | ORAL_TABLET | Freq: Once | ORAL | Status: AC
  Administered 2020-10-27: 13:00:00 650 mg via ORAL

## 2020-10-27 MED ORDER — DIPHENHYDRAMINE HCL 25 MG PO CAPS
50.0000 mg | ORAL_CAPSULE | Freq: Once | ORAL | Status: AC
  Administered 2020-10-27: 13:00:00 50 mg via ORAL

## 2020-10-27 MED ORDER — DIPHENHYDRAMINE HCL 25 MG PO CAPS
ORAL_CAPSULE | ORAL | Status: AC
  Filled 2020-10-27: qty 2

## 2020-10-27 NOTE — Progress Notes (Signed)
OK to treat with elevated HR per Dr. Lorenso Courier

## 2020-10-27 NOTE — Patient Instructions (Signed)
[facility] Discharge Instructions for Patients Receiving Chemotherapy  Today you received the following chemotherapy agents: Cyclophosphamide (Cytoxan), Bortezomib (Velcade), Daratumumab (Darzalex Faspro).  To help prevent nausea and vomiting after your treatment, we encourage you to take your nausea medication as directed by your MD.   If you develop nausea and vomiting that is not controlled by your nausea medication, call the clinic.   BELOW ARE SYMPTOMS THAT SHOULD BE REPORTED IMMEDIATELY:  *FEVER GREATER THAN 100.5 F  *CHILLS WITH OR WITHOUT FEVER  NAUSEA AND VOMITING THAT IS NOT CONTROLLED WITH YOUR NAUSEA MEDICATION  *UNUSUAL SHORTNESS OF BREATH  *UNUSUAL BRUISING OR BLEEDING  TENDERNESS IN MOUTH AND THROAT WITH OR WITHOUT PRESENCE OF ULCERS  *URINARY PROBLEMS  *BOWEL PROBLEMS  UNUSUAL RASH Items with * indicate a potential emergency and should be followed up as soon as possible.  Feel free to call the clinic should you have any questions or concerns. The clinic phone number is [phone].  Please show the [place] at check-in to the Emergency Department and triage nurse.

## 2020-10-30 LAB — GI PROFILE, STOOL, PCR

## 2020-10-31 ENCOUNTER — Encounter: Payer: Self-pay | Admitting: Hematology and Oncology

## 2020-10-31 ENCOUNTER — Telehealth: Payer: Self-pay | Admitting: Physician Assistant

## 2020-10-31 ENCOUNTER — Other Ambulatory Visit: Payer: Self-pay

## 2020-10-31 ENCOUNTER — Encounter: Payer: Self-pay | Admitting: Internal Medicine

## 2020-10-31 MED ORDER — CIPROFLOXACIN HCL 500 MG PO TABS: 500.0000 mg | ORAL_TABLET | Freq: Two times a day (BID) | ORAL | 0 refills | Status: DC

## 2020-10-31 NOTE — Telephone Encounter (Signed)
Returned call to [employer] and was advised they were calling to give results of the abnormal GI profile. I let them know that the results were already in the chart.

## 2020-11-02 ENCOUNTER — Other Ambulatory Visit: Payer: Self-pay | Admitting: Hematology and Oncology

## 2020-11-02 ENCOUNTER — Inpatient Hospital Stay (HOSPITAL_BASED_OUTPATIENT_CLINIC_OR_DEPARTMENT_OTHER): Payer: BC Managed Care – PPO | Admitting: Medical

## 2020-11-02 ENCOUNTER — Telehealth: Payer: Self-pay | Admitting: Cardiovascular Disease

## 2020-11-02 ENCOUNTER — Inpatient Hospital Stay: Payer: BC Managed Care – PPO

## 2020-11-02 ENCOUNTER — Other Ambulatory Visit: Payer: Self-pay | Admitting: Physician Assistant

## 2020-11-02 ENCOUNTER — Other Ambulatory Visit: Payer: Self-pay

## 2020-11-02 VITALS — BP 119/89 | HR 88 | Resp 18

## 2020-11-02 DIAGNOSIS — E8581 Light chain (AL) amyloidosis: Secondary | ICD-10-CM

## 2020-11-02 DIAGNOSIS — E854 Organ-limited amyloidosis: Secondary | ICD-10-CM | POA: Diagnosis not present

## 2020-11-02 DIAGNOSIS — Z5112 Encounter for antineoplastic immunotherapy: Secondary | ICD-10-CM | POA: Diagnosis not present

## 2020-11-02 DIAGNOSIS — R079 Chest pain, unspecified: Secondary | ICD-10-CM

## 2020-11-02 DIAGNOSIS — I200 Unstable angina: Secondary | ICD-10-CM

## 2020-11-02 LAB — CBC WITH DIFFERENTIAL (CANCER CENTER ONLY)
Abs Immature Granulocytes: 0.01 10*3/uL (ref 0.00–0.07)
Basophils Absolute: 0 10*3/uL (ref 0.0–0.1)
Basophils Relative: 0 %
Eosinophils Absolute: 0 10*3/uL (ref 0.0–0.5)
Eosinophils Relative: 1 %
HCT: 39 % (ref 39.0–52.0)
Hemoglobin: 12.6 g/dL — ABNORMAL LOW (ref 13.0–17.0)
Immature Granulocytes: 0 %
Lymphocytes Relative: 30 %
Lymphs Abs: 0.9 10*3/uL (ref 0.7–4.0)
MCH: 30.5 pg (ref 26.0–34.0)
MCHC: 32.3 g/dL (ref 30.0–36.0)
MCV: 94.4 fL (ref 80.0–100.0)
Monocytes Absolute: 0.4 10*3/uL (ref 0.1–1.0)
Monocytes Relative: 12 %
Neutro Abs: 1.8 10*3/uL (ref 1.7–7.7)
Neutrophils Relative %: 57 %
Platelet Count: 198 10*3/uL (ref 150–400)
RBC: 4.13 MIL/uL — ABNORMAL LOW (ref 4.22–5.81)
RDW: 15.5 % (ref 11.5–15.5)
WBC Count: 3.1 10*3/uL — ABNORMAL LOW (ref 4.0–10.5)
nRBC: 0 % (ref 0.0–0.2)

## 2020-11-02 LAB — LACTATE DEHYDROGENASE: LDH: 334 U/L — ABNORMAL HIGH (ref 98–192)

## 2020-11-02 LAB — CMP (CANCER CENTER ONLY)
ALT: 17 U/L (ref 0–44)
AST: 17 U/L (ref 15–41)
Albumin: 1.3 g/dL — ABNORMAL LOW (ref 3.5–5.0)
Alkaline Phosphatase: 78 U/L (ref 38–126)
Anion gap: 10 (ref 5–15)
BUN: 24 mg/dL — ABNORMAL HIGH (ref 6–20)
CO2: 29 mmol/L (ref 22–32)
Calcium: 7.8 mg/dL — ABNORMAL LOW (ref 8.9–10.3)
Chloride: 105 mmol/L (ref 98–111)
Creatinine: 1.29 mg/dL — ABNORMAL HIGH (ref 0.61–1.24)
GFR, Estimated: >60 mL/min (ref >60)
Glucose, Bld: 98 mg/dL (ref 70–99)
Potassium: 3.3 mmol/L — ABNORMAL LOW (ref 3.5–5.1)
Sodium: 144 mmol/L (ref 135–145)
Total Bilirubin: 0.4 mg/dL (ref 0.3–1.2)
Total Protein: 4.2 g/dL — ABNORMAL LOW (ref 6.5–8.1)

## 2020-11-02 MED ORDER — DARATUMUMAB-HYALURONIDASE-FIHJ 1800-30000 MG-UT/15ML ~~LOC~~ SOLN
1800.0000 mg | Freq: Once | SUBCUTANEOUS | Status: AC
  Administered 2020-11-02: 13:00:00 1800 mg via SUBCUTANEOUS
  Filled 2020-11-02: qty 15

## 2020-11-02 MED ORDER — CYCLOPHOSPHAMIDE CHEMO INJECTION 1 GM
300.0000 mg/m2 | Freq: Once | INTRAMUSCULAR | Status: AC
  Administered 2020-11-02: 12:00:00 680 mg via INTRAVENOUS
  Filled 2020-11-02: qty 34

## 2020-11-02 MED ORDER — DEXAMETHASONE 4 MG PO TABS
40.0000 mg | ORAL_TABLET | Freq: Once | ORAL | Status: AC
  Administered 2020-11-02: 11:00:00 40 mg via ORAL

## 2020-11-02 MED ORDER — POTASSIUM CHLORIDE CRYS ER 20 MEQ PO TBCR
40.0000 meq | EXTENDED_RELEASE_TABLET | Freq: Once | ORAL | Status: AC
  Administered 2020-11-02: 11:00:00 40 meq via ORAL

## 2020-11-02 MED ORDER — BORTEZOMIB CHEMO SQ INJECTION 3.5 MG (2.5MG/ML)
1.3000 mg/m2 | Freq: Once | INTRAMUSCULAR | Status: AC
  Administered 2020-11-02: 13:00:00 3 mg via SUBCUTANEOUS
  Filled 2020-11-02: qty 1.2

## 2020-11-02 MED ORDER — DEXAMETHASONE 4 MG PO TABS
ORAL_TABLET | ORAL | Status: AC
  Filled 2020-11-02: qty 10

## 2020-11-02 MED ORDER — DIPHENHYDRAMINE HCL 25 MG PO CAPS
50.0000 mg | ORAL_CAPSULE | Freq: Once | ORAL | Status: AC
  Administered 2020-11-02: 11:00:00 50 mg via ORAL

## 2020-11-02 MED ORDER — ACETAMINOPHEN 325 MG PO TABS
ORAL_TABLET | ORAL | Status: AC
  Filled 2020-11-02: qty 2

## 2020-11-02 MED ORDER — SODIUM CHLORIDE 0.9 % IV SOLN
INTRAVENOUS | Status: DC
  Filled 2020-11-02: qty 250

## 2020-11-02 MED ORDER — ISOSORBIDE MONONITRATE ER 60 MG PO TB24: 60.0000 mg | ORAL_TABLET | Freq: Every day | ORAL | Status: DC

## 2020-11-02 MED ORDER — POTASSIUM CHLORIDE CRYS ER 20 MEQ PO TBCR
EXTENDED_RELEASE_TABLET | ORAL | Status: AC
  Filled 2020-11-02: qty 2

## 2020-11-02 MED ORDER — DIPHENHYDRAMINE HCL 25 MG PO CAPS
ORAL_CAPSULE | ORAL | Status: AC
  Filled 2020-11-02: qty 2

## 2020-11-02 MED ORDER — ACETAMINOPHEN 325 MG PO TABS
650.0000 mg | ORAL_TABLET | Freq: Once | ORAL | Status: AC
  Administered 2020-11-02: 11:00:00 650 mg via ORAL

## 2020-11-02 NOTE — Patient Instructions (Signed)
[facility] Discharge Instructions for Patients Receiving Chemotherapy  Today you received the following chemotherapy agents: cyclophosphamide, bortezomib, and daratumumab.  To help prevent nausea and vomiting after your treatment, we encourage you to take your nausea medication as directed.   If you develop nausea and vomiting that is not controlled by your nausea medication, call the clinic.   BELOW ARE SYMPTOMS THAT SHOULD BE REPORTED IMMEDIATELY:  *FEVER GREATER THAN 100.5 F  *CHILLS WITH OR WITHOUT FEVER  NAUSEA AND VOMITING THAT IS NOT CONTROLLED WITH YOUR NAUSEA MEDICATION  *UNUSUAL SHORTNESS OF BREATH  *UNUSUAL BRUISING OR BLEEDING  TENDERNESS IN MOUTH AND THROAT WITH OR WITHOUT PRESENCE OF ULCERS  *URINARY PROBLEMS  *BOWEL PROBLEMS  UNUSUAL RASH Items with * indicate a potential emergency and should be followed up as soon as possible.  Feel free to call the clinic should you have any questions or concerns. The clinic phone number is [phone].  Please show the [place] at check-in to the Emergency Department and triage nurse.

## 2020-11-02 NOTE — Addendum Note (Signed)
Addended by: Leotis Pain, Sheldon on: 11/02/2020 03:58 PM   Modules accepted: Orders

## 2020-11-02 NOTE — Telephone Encounter (Signed)
Spoke with the patient directly. He states his wife's request was made in error and he does not want to switch providers at this time.

## 2020-11-02 NOTE — Telephone Encounter (Signed)
Wife of the patient called. Patient would like to switch from Dr. Gwenlyn Found to Dr. Oval Linsey. Please let the wife know what the office decides.

## 2020-11-02 NOTE — Progress Notes (Signed)
Patient in for treatment c/o chest pain,left side lateral, BP elevated 134/99 1 hour after taking BP med. Rates pain as a 4 describing it as dull and coming and going. Sandi Mealy PA-C notified and EKG ordered and performed.   Sandi Mealy PA-C examined and oral KCl ordered for K level of 3.3  Okay to proceed with treatment

## 2020-11-02 NOTE — Telephone Encounter (Signed)
Returned call to pt. Advised him of DOD Dr. Kathalene Frames recommendation to increase him Imdur to 60 mg daily. The pt verbalizes understanding. States he has plenty of the medication and does not need a refill at this time. Additionally, advised pt of Dr. Kathalene Frames recommendation to bring him in for an office visit with an APP in the next 1-2 weeks. Appointment made with Coletta Memos, NP for Tuesday, November 14, 2020 at 11:15 a.m.

## 2020-11-02 NOTE — Telephone Encounter (Signed)
Addressed in telephone note with patient 11/02/2020

## 2020-11-02 NOTE — Telephone Encounter (Signed)
Sandi Mealy PA at the cancer center called in and would like to know if Dr Gwenlyn Found would ok him calling some Nitro for this pt to have for unstable Angina   His number [phone]

## 2020-11-02 NOTE — Telephone Encounter (Signed)
PA at the [facility] reports he saw pt this a.m. (following for amyloid). PA states the pt describes having more episodes of left chest pain, sometimes having at rest, sometimes while he is up and about. States EKG in his office today was normal.  PA wants to know if it's okay with our office to prescribe him sublingual nitro since he is currently on Imdur. Will seek advice from DOD.  Per Dr. Audie Box (DOD), increase pt's Imdur from 30 mg daily to 60 mg daily and make an appointment with an APP for 1-2 weeks from now.  Left message for pt to call back.

## 2020-11-03 NOTE — Progress Notes (Signed)
[place] [phone]   [phone]  PROGRESS NOTE  Patient Care Team: Isaac Bliss, Rayford Halsted, MD as PCP - General (Internal Medicine) Lorretta Harp, MD as PCP - Cardiology (Cardiology)  Hematological/Oncological History # AL Amyloidosis of the Kidney 1) 08/03/2020: Free kappa 19.5, Lambda 205.7, Kappa/Lambda ratio: 0.09. M protein undetectable 2) 08/07/2020: kidney biopsy performed, results consistent with lambda light chain AL amyloidosis 3) 08/23/2020: establish care with Dr. Lorenso Courier  4) 09/01/2020: bone marrow biopsy shows plasma cell neoplasm with focal amyloid deposits 5) 09/29/2020: start of Cycle 1 Day 1 of Dara-CyBorD  Interval History:  Samuel Butler 59 y.o. male with medical history significant for AL amyloidosis of the kidney presents for a follow up visit. The patient's last visit was on 09/28/2020 at which time he started treatment. In the interim since his last visit he has started Cycle 1 of Dara-CyBorD.   On exam today Samuel Butler is accompanied by his wife.  He reports that he has tolerated his treatments so far relatively well.  He notes that he "feels okay" and that he is not having any major side effects as result of the treatment.  He notes that he has seen some blood in his stool and is currently being evaluated by GI for this.  He does have some occasional shortness of breath and reports that his appetite has decreased, though he has been doing his best to keep up with nutrition.  He currently denies any fevers, chills, sweats, nausea, vomiting or diarrhea.  A full 10 point ROS is listed below.  MEDICAL HISTORY:  Past Medical History:  Diagnosis Date  . DDD (degenerative disc disease), lumbar    per his report  . Diabetes mellitus without complication ([place])   . Gout 04/16/2013  . Hyperlipidemia   . Hypertension   . IBD (inflammatory bowel disease) 04/05/2016  . Pulmonary embolism ([place])   . Tobacco use disorder 03/28/2015     SURGICAL HISTORY: Past Surgical History:  Procedure Laterality Date  . ABDOMINAL AORTOGRAM W/LOWER EXTREMITY Right 06/26/2020   Procedure: ABDOMINAL AORTOGRAM W/LOWER EXTREMITY;  Surgeon: Lorretta Harp, MD;  Location: [facility] [facility];  Service: Cardiovascular;  Laterality: Right;  . COLONOSCOPY    . PERIPHERAL VASCULAR INTERVENTION  06/26/2020   Procedure: PERIPHERAL VASCULAR INTERVENTION;  Surgeon: Lorretta Harp, MD;  Location: [place] [facility];  Service: Cardiovascular;;  Right SFA  . TONSILLECTOMY      SOCIAL HISTORY: Social History   Socioeconomic History  . Marital status: Married    Spouse name: Not on file  . Number of children: Not on file  . Years of education: Not on file  . Highest education level: Not on file  Occupational History  . Not on file  Tobacco Use  . Smoking status: Former Smoker    Types: Cigarettes    Quit date: 10/23/2016    Years since quitting: 4.0  . Smokeless tobacco: Never Used  . Tobacco comment: per patient 5 cigarettes a day   Vaping Use  . Vaping Use: Never used  Substance and Sexual Activity  . Alcohol use: Yes    Comment: occasional  . Drug use: No  . Sexual activity: Not on file  Other Topics Concern  . 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 Worried About Charity fundraiser in the Last Year: Sometimes true  . Ran Out of Food in the Last Year: Never true  Transportation Needs: No Transportation Needs  . Lack of Transportation (Medical): No  . Lack of Transportation (Non-Medical): No  Physical Activity: Not on file  Stress: Not on file  Social Connections: Not on file  Intimate Partner Violence: Not on file     FAMILY HISTORY: Family History  Problem Relation Age of Onset  . Hypertension Father   . Diabetes Father   . Stroke Father   . Colon cancer Brother     ALLERGIES:  is allergic to crestor [rosuvastatin calcium].  MEDICATIONS:  Current Outpatient Medications  Medication Sig Dispense Refill  . acetaminophen (TYLENOL) 650 MG CR tablet Take 1,300 mg by mouth every 8 (eight) hours as needed for pain.     Marland Kitchen albuterol (VENTOLIN HFA) 108 (90 Base) MCG/ACT inhaler Inhale 2 puffs into the lungs every 6 (six) hours as needed for wheezing or shortness of breath. 8 g 0  . APIXABAN (ELIQUIS) VTE STARTER PACK (10MG AND 5MG) Take as directed on package: start with two-43m tablets twice daily for 7 days. On day 8, switch to one-581mtablet twice daily. 1 each 0  . atorvastatin (LIPITOR) 80 MG tablet Take 1 tablet (80 mg total) by mouth daily. 90 tablet 2  . ciprofloxacin (CIPRO) 500 MG tablet Take 1 tablet (500 mg total) by mouth 2 (two) times daily. 6 tablet 0  . clopidogrel (PLAVIX) 75 MG tablet Take 1 tablet (75 mg total) by mouth daily with breakfast. 90 tablet 3  . dicyclomine (BENTYL) 10 MG capsule Take 1 capsule (10 mg total) by mouth 4 (four) times daily. Take 4 times daily before meals and at bedtime 90 capsule 0  . ergocalciferol (VITAMIN D2) 1.25 MG (50000 UT) capsule Take 1 capsule (50,000 Units total) by mouth once a week. (Patient taking differently: Take 50,000 Units by mouth every Thursday.) 12 capsule 0  . isosorbide mononitrate (IMDUR) 60 MG 24 hr tablet Take 1 tablet (60 mg total) by mouth daily.    . Marland Kitchenosartan (COZAAR) 50 MG tablet Take 1 tablet (50 mg total) by mouth daily. 90 tablet 3  . metFORMIN (GLUCOPHAGE) 500 MG tablet Take 1 tablet (500 mg total) by mouth daily with breakfast. Restart on 10/21/2020    . ondansetron (ZOFRAN) 8 MG tablet TAKE 1 TABLET BY MOUTH EVERY 8 HOURS AS NEEDED FOR NAUSEA OR VOMITING 30 tablet 0  . pantoprazole (PROTONIX) 40 MG tablet Take 1 tablet (40 mg  total) by mouth daily. (Patient taking differently: Take 40 mg by mouth daily before breakfast.) 90 tablet 1  . potassium chloride SA (KLOR-CON) 20 MEQ tablet Take 1 tablet (20 mEq total) by mouth 2 (two) times daily. 28 tablet 0  . torsemide (DEMADEX) 20 MG tablet Take 20 mg by mouth 2 (two) times daily.    . VEMLIDY 25 MG TABS Take 25 mg by mouth daily.      No current facility-administered medications for this visit.    REVIEW OF SYSTEMS:   Constitutional: ( - ) fevers, ( - )  chills , ( - ) night sweats Eyes: ( - ) blurriness of vision, ( - )  double vision, ( - ) watery eyes Ears, nose, mouth, throat, and face: ( - ) mucositis, ( - ) sore throat Respiratory: ( - ) cough, ( - ) dyspnea, ( - ) wheezes Cardiovascular: ( - ) palpitation, ( - ) chest discomfort, ( - ) lower extremity swelling Gastrointestinal:  ( - ) nausea, ( - ) heartburn, ( - ) change in bowel habits Skin: ( - ) abnormal skin rashes Lymphatics: ( - ) new lymphadenopathy, ( - ) easy bruising Neurological: ( - ) numbness, ( - ) tingling, ( - ) new weaknesses Behavioral/Psych: ( - ) mood change, ( - ) new changes  All other systems were reviewed with the patient and are negative.  PHYSICAL EXAMINATION: ECOG PERFORMANCE STATUS: 1 - Symptomatic but completely ambulatory  Vitals:   10/27/20 1113  BP: 109/87  Pulse: (!) 109  Resp: 18  Temp: 98 F (36.7 C)  SpO2: 98%   Filed Weights   10/27/20 1113  Weight: 236 lb 11.2 oz (107.4 kg)    GENERAL: well appearing middle aged [place] American male alert, no distress and comfortable SKIN: skin color, texture, turgor are normal, no rashes or significant lesions EYES: conjunctiva are pink and non-injected, sclera clear LUNGS: clear to auscultation and percussion with normal breathing effort HEART: regular rate & rhythm and no murmurs and no lower extremity edema Musculoskeletal: no cyanosis of digits and no clubbing  PSYCH: alert & oriented x 3, fluent speech NEURO: no  focal motor/sensory deficits  LABORATORY DATA:  I have reviewed the data as listed CBC Latest Ref Rng & Units 11/02/2020 10/27/2020 10/20/2020  WBC 4.0 - 10.5 K/uL 3.1(L) 3.3(L) 4.1  Hemoglobin 13.0 - 17.0 g/dL 12.6(L) 12.9(L) 11.3(L)  Hematocrit 39.0 - 52.0 % 39.0 39.3 33.8(L)  Platelets 150 - 400 K/uL 198 218 102(L)    CMP Latest Ref Rng & Units 11/02/2020 10/27/2020 10/20/2020  Glucose 70 - 99 mg/dL 98 96 96  BUN 6 - 20 mg/dL 24(H) 17 19  Creatinine 0.61 - 1.24 mg/dL 1.29(H) 1.49(H) 1.24  Sodium 135 - 145 mmol/L 144 141 140  Potassium 3.5 - 5.1 mmol/L 3.3(L) 3.8 3.5  Chloride 98 - 111 mmol/L 105 106 105  CO2 22 - 32 mmol/L 29 29 26   Calcium 8.9 - 10.3 mg/dL 7.8(L) 8.0(L) 7.2(L)  Total Protein 6.5 - 8.1 g/dL 4.2(L) 4.3(L) -  Total Bilirubin 0.3 - 1.2 mg/dL 0.4 0.4 -  Alkaline Phos 38 - 126 U/L 78 89 -  AST 15 - 41 U/L 17 20 -  ALT 0 - 44 U/L 17 16 -    Lab Results  Component Value Date   MPROTEIN 0.1 (H) 10/13/2020   MPROTEIN Not Observed 08/23/2020   Lab Results  Component Value Date   KPAFRELGTCHN 10.0 10/13/2020   KPAFRELGTCHN 18.5 08/23/2020   KPAFRELGTCHN 18.0 08/04/2020   LAMBDASER 56.1 (H) 10/13/2020   LAMBDASER 199.1 (H) 08/23/2020   LAMBDASER 192.5 (H) 08/04/2020   KAPLAMBRATIO 0.36 (L) 10/16/2020   KAPLAMBRATIO 0.18 (L) 10/13/2020   KAPLAMBRATIO 0.11 (L) 08/25/2020     RADIOGRAPHIC STUDIES: CT CORONARY MORPH W/CTA COR W/SCORE W/CA W/CM &/OR WO/CM  Addendum Date: 10/18/2020   ADDENDUM REPORT: 10/18/2020 18:29 HISTORY: Chest pain/anginal equiv, 38yrCHD risk 10-20%, not treadmill candidate EXAM: Cardiac/Coronary CT TECHNIQUE: The patient was scanned on a [employer] PROTOCOL: A 120 kV prospective scan was triggered in the descending thoracic aorta at 111 HU's. Axial non-contrast 3 mm slices were carried out  through the heart. The data set was analyzed on a dedicated work station and scored using the [place]. Gantry rotation speed was 250  msecs and collimation was 0.6 mm. Beta blockade and 0.8 mg of sl NTG was given. The 3D data set was reconstructed in 5% intervals of 35-75% of the R-R cycle. Diastolic phases were analyzed on a dedicated work station using MPR, MIP and VRT modes. The patient received 15m OMNIPAQUE IOHEXOL 350 MG/ML SOLN of contrast. FINDINGS: Coronary calcium score: The patient's coronary artery calcium score is 135, which places the patient in the 60 percentile. Coronary arteries: Two separate ostia for left anterior descending and left circumflex coronary origins. Normal right coronary anatomy. Co-dominance Right Coronary Artery: Normal caliber vessel. There is a portion of heavily calcified mixed plaque in the proximal RCA with >70% stenosis. There is then a small caliber portion of the proximal to mid RCA with likely completely occluded mid RCA. The distal RCA appears to reconstitute via collaterals. Left Anterior Descending Coronary Artery: [facility] aortic [facility]. Normal caliber vessel. There is mixed calcified and noncalcified plaque just proximal to D1, with 1-24% stenosis. Gives rise to 2 diagonal branches. Distal LAD wraps apex Left Circumflex Artery: Ar[facility] directly from [facility] root. normal caliber vessel. Trivial plaque without significant stenosis. Gives rise to large OM branch. Aorta: Moderately enlarged, 48 mm at the mid ascending aorta (level of the PA bifurcation) measured double oblique. No calcifications. No dissection. Aortic Valve: No calcifications. Trileaflet. Other findings: Normal pulmonary vein drainage into the left atrium. Normal left atrial appendage without a thrombus. Enlarged pulmonary artery size (3.8 cm) Moderate pericardial effusion Evidence of pulmonary embolism, most prominent in the RPA IMPRESSION: 1. Evidence of pulmonary embolism. Dr. BGwenlyn Foundnotified, patient has been sent to ER for evaluation. Enlarged PA size. 2. Severe CAD, CADRADS = 4. CT FFR will be reported  separately. Likely CTO mid RCA with scattered nonobstructive disease in the left circulation. 3. Coronary calcium score of 135. This was 629percentile for age and sex matched control. 4. Co dominant circulation, with individual ostia for the LAD and LCX [facility] from the [facility] 5.  Moderate pericardial effusion 6.  Moderately enlarged ascending aorta. Findings communicated with Dr. BGwenlyn Found Electronically Signed   By: BBuford DresserM.D.   On: 10/18/2020 18:29   Result Date: 10/18/2020 EXAM: OVER-READ INTERPRETATION  CT CHEST The following report is an over-read performed by radiologist Dr. DVinnie Langtonof [facility], [place] 10[place]18/2020. This over-read does not include interpretation of cardiac or coronary anatomy or pathology. The coronary calcium score/coronary CTA interpretation by the cardiologist is attached. COMPARISON:  None. FINDINGS: Aortic atherosclerosis with aneurysmal dilatation of the ascending thoracic aorta (4.8 cm in diameter). Small to moderate pericardial effusion. Multiple filling defects within the pulmonary arterial tree bilaterally, compatible with pulmonary embolism. Dilatation of the pulmonic trunk (3.9 cm in diameter). Moderate right-sided pleural effusion lying dependently. Within the visualized portions of the thorax there are no suspicious appearing pulmonary nodules or masses, there is no acute consolidative airspace disease, no left pleural effusion, no pneumothorax and no lymphadenopathy. Visualized portions of the upper abdomen are unremarkable. There are no aggressive appearing lytic or blastic lesions noted in the visualized portions of the skeleton. IMPRESSION: 1. Study is positive for bilateral pulmonary embolism which appear segmental in size. 2. Ground-glass attenuation in the right lower lobe in a distribution of an embolus. Although much of this may be related atelectasis, the  possibility of some degree of alveolar hemorrhage from pulmonary infarction is not  excluded. There is also a moderate right-sided pleural effusion lying dependently. 3. Small to moderate pericardial effusion. 4. Aortic atherosclerosis with aneurysmal dilatation of the ascending thoracic aorta (4.8 cm in diameter). Ascending thoracic aortic aneurysm. Recommend semi-annual imaging followup by CTA or MRA and referral to cardiothoracic surgery if not already obtained. This recommendation follows 2010 ACCF/AHA/AATS/ACR/ASA/SCA/SCAI/SIR/STS/SVM Guidelines for the Diagnosis and Management of Patients With Thoracic Aortic Disease. Circulation. 2010; 121: H888-L579. Aortic aneurysm NOS (ICD10-I71.9). 5. Dilatation of the pulmonic trunk (3.9 cm in diameter), which may suggest pulmonary arterial hypertension. These results were called by telephone at the time of interpretation on 10/18/2020 at 1:50 pm to provider [facility], who verbally acknowledged these results. Electronically Signed: By: Vinnie Langton M.D. On: 10/18/2020 13:51   CT CORONARY FRACTIONAL FLOW RESERVE DATA PREP  Result Date: 10/19/2020 EXAM: CT FFR ANALYSIS CLINICAL DATA:  Chest pain, nonspecific FINDINGS: FFRct analysis was performed on the original cardiac CT angiogram dataset. Diagrammatic representation of the FFRct analysis is provided in a separate PDF document in PACS. This dictation was created using the PDF document and an interactive 3D model of the results. 3D model is not available in the EMR/PACS. Normal FFR range is >0.80. 1. LAD: No significant stenosis in proximal portion. Gradual decrease in FFR to significant in distal LAD without significant stenosis. Proximal FFR = 0.99, Mid FFR = 0.85, Distal FFR = 0.65 2. LCX: No significant stenosis. Proximal FFR = 0.95, Distal FFR = 0.94. 3. RCA: Modeled chronic CTO in mid RCA. Proximal FFR = 0.93, CTO in mid RCA and beyond. IMPRESSION: 1. CT FFR analysis shows CTO in mid RCA and no focal significant stenosis in left system. No significant left main/likely separate ostia for  LAD and LCx. Distant LAD gradually reaches FFR significance but without a focal stenosis. Electronically Signed   By: Buford Dresser M.D.   On: 10/19/2020 14:05   ECHOCARDIOGRAM COMPLETE  Result Date: 10/19/2020    ECHOCARDIOGRAM REPORT   Patient Name:   Milen A Wivell Date of Exam: 10/19/2020 Medical Rec #:  [id number]       Height:       71.0 in Accession #:    [id number]      Weight:       236.1 lb Date of Birth:  27-May-1961       BSA:          2.263 m Patient Age:    59 years        BP:           125/94 mmHg Patient Gender: M               HR:           92 bpm. Exam Location:  Inpatient Procedure: 2D Echo Indications:    Pulmonary Embolus I26.99  History:        Patient has prior history of Echocardiogram examinations, most                 recent 08/05/2020. Risk Factors:Dyslipidemia, Diabetes and                 Hypertension.  Sonographer:    Mikki Santee RDCS (AE) Referring Phys: [id number] [place]  1. Compared to previous echo IVC is dilated. Ascending aorta is unchanged.  2. Left ventricular ejection fraction, by estimation, is 55 to 60%. The left ventricle has normal  function. The left ventricle has no regional wall motion abnormalities. There is severe left ventricular hypertrophy. Indeterminate diastolic filling due to E-A fusion.  3. Right ventricular systolic function is normal. The right ventricular size is normal. There is mildly elevated pulmonary artery systolic pressure.  4. Left atrial size was moderately dilated.  5. Right atrial size was mildly dilated.  6. A small pericardial effusion is present.  7. The mitral valve is normal in structure. Trivial mitral valve regurgitation.  8. The aortic valve is normal in structure. Aortic valve regurgitation is mild to moderate.  9. Aortic dilatation noted. There is mild dilatation of the aortic root, measuring 41 mm. There is moderate dilatation of the ascending aorta, measuring 47 mm. 10. The inferior vena cava is  dilated in size with <50% respiratory variability, suggesting right atrial pressure of 15 mmHg. FINDINGS  Left Ventricle: Left ventricular ejection fraction, by estimation, is 55 to 60%. The left ventricle has normal function. The left ventricle has no regional wall motion abnormalities. The left ventricular internal cavity size was normal in size. There is  severe left ventricular hypertrophy. Indeterminate diastolic filling due to E-A fusion. Right Ventricle: The right ventricular size is normal. No increase in right ventricular wall thickness. Right ventricular systolic function is normal. There is mildly elevated pulmonary artery systolic pressure. The tricuspid regurgitant velocity is 2.82  m/s, and with an assumed right atrial pressure of 8 mmHg, the estimated right ventricular systolic pressure is 62.5 mmHg. Left Atrium: Left atrial size was moderately dilated. Right Atrium: Right atrial size was mildly dilated. Pericardium: A small pericardial effusion is present. Mitral Valve: The mitral valve is normal in structure. Trivial mitral valve regurgitation. Tricuspid Valve: The tricuspid valve is normal in structure. Tricuspid valve regurgitation is mild. Aortic Valve: The aortic valve is normal in structure. Aortic valve regurgitation is mild to moderate. Aortic regurgitation PHT measures 537 msec. Pulmonic Valve: The pulmonic valve was normal in structure. Pulmonic valve regurgitation is not visualized. Aorta: Aortic dilatation noted. There is mild dilatation of the aortic root, measuring 41 mm. There is moderate dilatation of the ascending aorta, measuring 47 mm. Venous: The inferior vena cava is dilated in size with less than 50% respiratory variability, suggesting right atrial pressure of 15 mmHg. IAS/Shunts: No atrial level shunt detected by color flow Doppler.  LEFT VENTRICLE PLAX 2D LVIDd:         4.70 cm  Diastology LVIDs:         3.20 cm  LV e' medial:    4.03 cm/s LV PW:         1.90 cm  LV E/e'  medial:  22.3 LV IVS:        1.80 cm  LV e' lateral:   4.47 cm/s LVOT diam:     2.60 cm  LV E/e' lateral: 20.1 LV SV:         93 LV SV Index:   41 LVOT Area:     5.31 cm  RIGHT VENTRICLE RV S prime:     12.80 cm/s TAPSE (M-mode): 1.9 cm LEFT ATRIUM           Index       RIGHT ATRIUM           Index LA diam:      3.10 cm 1.37 cm/m  RA Area:     21.70 cm LA Vol (A2C): 88.0 ml 38.89 ml/m RA Volume:   68.80 ml  30.41 ml/m LA Vol (  A4C): 92.3 ml 40.79 ml/m  AORTIC VALVE LVOT Vmax:   110.00 cm/s LVOT Vmean:  80.600 cm/s LVOT VTI:    0.176 m AI PHT:      537 msec  AORTA Ao Root diam: 4.10 cm MITRAL VALVE                TRICUSPID VALVE MV Area (PHT): 3.06 cm     TR Peak grad:   31.8 mmHg MV Decel Time: 248 msec     TR Vmax:        282.00 cm/s MV E velocity: 90.00 cm/s MV A velocity: 111.00 cm/s  SHUNTS MV E/A ratio:  0.81         Systemic VTI:  0.18 m                             Systemic Diam: 2.60 cm Dorris Carnes MD Electronically signed by Dorris Carnes MD Signature Date/Time: 10/19/2020/12:44:55 PM    Final     ASSESSMENT & PLAN Cam A Crean 59 y.o. male with medical history significant for AL amyloidosis of the kidney presents for a follow up visit.  After review the labs, the records, discussion with the patient the findings are most consistent with AL amyloidosis affecting the kidney.  As such I would recommended we start treatment with Dara CyBorD.  After reviewing the biopsy results his findings are most consistent with AL amyloidosis. As such the treatment of choice would be to target his plasma cell population with a triplet or quadruplet therapy. Therapy of choice in this case would consist of daratumumab, Velcade, cyclophosphamide, and dexamethasone. Given the patient's good functional status we will start with full dose Dara-CyBorD. I previously discussed the side effects of this chemotherapy with the patient including neuropathy, elevated blood pressure, drop in blood counts, hypersensitivity  reaction, chest tightness, increased infection risk, and fatigue. The patient and family voiced their understanding of these findings and are agreeable to moving forward with quadruple therapy with the goal of being a bridge to bone marrow transplant.   The regimen of choice will be daratumumab, bortezomib, cyclophosphamide and dexamethasone per the ANDROMEDA Study ( Blood. 2020 Jul 2;136(1):71-80). Treatment consists of: Cyclophosphamide 300 mg/m2 intravenously and bortezomib 1.3 mg/m2 subcutaneously were given on days 1, 8, 15, and 22 of each 28 day cycle for up to 6 cycles. Dexamethasone 40 mg (starting dose) was given orally or intravenously weekly for each cycle for up to 6 cycles. DARA [place] was administered in a single, premixed vial and given by manual [place] injection over the course of 3 to 5 minutes weekly in cycles 1 to 2, every 2 weeks in cycles 3 to 6, and every 4 weeks thereafter as monotherapy for a maximum of 2 years. We would only consider this continued dosing regimen if patient declined or was determined not to be a candidate for BMT.   On exam today Samuel Butler is been tolerating treatment well overall.   His hemoglobin is stable and otherwise his labs are acceptable for proceeding with treatment today.  #AL Amyloidosis Affecting the Kidney --bone marrow biopsy and kidney biopsy helped to confirm the diagnosis of AL amyloidosis. --currently being treated with  Dara-CyBorD (per the [facility] Trial), started Cycle 1 Day 1 on 09/29/2020.  --will monitoring monthly SPEP, UPEP, and SFLC while on treatment. --will have patient return for weekly treatment with q 2 week clinic visits initially.   #Injection Site  Rash, improving --appears benign on exam today  --completed antibiotic therapy  #Supportive Care --chemotherapy education performed --zofran 27m q8H PRN and compazine 158mPO q6H for nausea --acyclovir 40060mO BID for VCZ prophylaxis --albuterol HFA inhaler for daratumumab  treatment -- EMLA cream for port not required, patient declines port at this time, but will reconsider once treatment starts -- no pain medication required at this time.   No orders of the defined types were placed in this encounter.   All questions were answered. The patient knows to call the clinic with any problems, questions or concerns.  A total of more than 30 minutes were spent on this encounter and over half of that time was spent on counseling and coordination of care as outlined above.   JohLedell PeoplesD Department of Hematology/Oncology [facility] [place]: [phone]: [id number]: [email or web address]  11/03/2020 1:26 PM   Literature Support:  V, Wendie Chess, Weiss BM, VerFredderick Severanceomenzo RL. Daratumumab plus CyBorD for patients with newly diagnosed AL amyloidosis: safety run-in results of ANDROMEDA. Blood. 2020 Jul 2;136(1):71-80.  --Daratumumab-CyBorD was well tolerated, with no new safety concerns versus the intravenous formulation, and demonstrated robust hematologic and organ responses.

## 2020-11-06 NOTE — Progress Notes (Signed)
Symptoms Management Clinic Progress Note   Samuel Butler [id number] [birth year] 59 y.o.  Samuel Butler is managed by Dr. Narda Rutherford  Actively treated with chemotherapy/immunotherapy/hormonal therapy: yes  Current therapy: Velcade, Cytoxan, and Darzalex  Last treated: 10/27/2020 (cycle 1, day 22)  Next scheduled appointment with provider: 11/17/2020  Assessment: Plan:    Angina pectoris, unstable ([place])  Organ-limited amyloidosis ([place])   Angina: An EKG was completed today which returned showing a normal sinus rhythm at 93 bpm with a possible anterior infarct of undetermined age, and prolonged QT interval.  These were compared with prior EKGs and were found to be relatively stable.  Dr. [place] office was contacted.  They will contact Samuel Butler to increase his Imdur and will schedule appointment with him sooner than his upcoming January appointment with Dr. Gwenlyn Found.  Samuel Butler knows to contact 911 or present to the emergency room should he continue to have chest pain.  Amyloidosis of the kidney:c Samuel Butler continues to be followed by Dr. Lorenso Courier and is status post cycle 1, day 22 of Velcade, Cytoxan, and Darzalex which was dosed on 10/27/2020.  He is scheduled to return for follow-up on 11/17/2020.  Please see After Visit Summary for patient specific instructions.  Future Appointments  Date Time Provider [place]  11/07/2020  9:30 AM [facility] R/F 1 [facility] [place]  11/09/2020  9:15 AM [facility] LAB [facility] None  11/09/2020 10:15 AM [facility] INFUSION [facility] None  11/14/2020 11:15 AM Cleaver, [name], NP [facility]  11/16/2020 10:30 AM Erline Hau, MD [facility]  11/17/2020 11:00 AM [facility] LAB [facility] None  11/17/2020 11:30 AM Orson Slick, MD [facility] None  11/17/2020 12:30 PM [facility] INFUSION [facility] None  11/24/2020  9:15 AM [facility] LAB [facility] None  11/24/2020 10:15 AM [facility] INFUSION  [facility] None  11/27/2020 10:30 AM Mignon Pine, DO [facility]  03/26/2021  1:00 PM [facility]-CV NL VASC 1 [facility]  03/26/2021  2:00 PM [facility] NL VASC 1 [facility]    No orders of the defined types were placed in this encounter.      Subjective:   Patient ID:  Samuel Butler is a 59 y.o. (DOB [birth year]) male.  Chief Complaint: No chief complaint on file.   HPI Samuel Butler  is a 59 y.o. male with a diagnosis of amyloidosis of the kidney. He is managed by Dr. Lorenso Courier and is status post cycle 1, day 22 of Velcade, Cytoxan, and Darzalex which was dosed on 10/27/2020.  He additionally has a history of hypertension, peripheral arterial disease, claudication, and an acute pulmonary embolism.  He was seen in infusion today as he reported that he has had episodes of chest pain both with activity and at rest.  His cardiologist is Dr. Quay Burow whom he is scheduled to see in mid to early January.  He denies any other issues of concern.  An EKG was completed today which showed a normal sinus rhythm at 93 bpm with the possibility of an anterior infarct of undetermined age and with prolonged QT intervals.  Prior CT scans from 10/18/2020 and 08/31/2020 were reviewed and were found to have similar results.  Samuel Butler continues on Eliquis, Lipitor, Plavix, Imdur, Cozaar, and Demadex.   Medications: I have reviewed the patient's current medications.  Allergies:  Allergies  Allergen Reactions  . Crestor [Rosuvastatin Calcium] Other (See Comments)    Joint aches    Past Medical History:  Diagnosis Date  . DDD (degenerative disc disease), lumbar    per his report  . Diabetes mellitus without complication (HCC)   . Gout 04/16/2013  . Hyperlipidemia   . Hypertension   . IBD (inflammatory bowel disease) 04/05/2016  . Pulmonary embolism (HCC)   . Tobacco use disorder 03/28/2015    Past Surgical History:  Procedure Laterality Date  . ABDOMINAL AORTOGRAM W/LOWER EXTREMITY Right  06/26/2020   Procedure: ABDOMINAL AORTOGRAM W/LOWER EXTREMITY;  Surgeon: Runell Gess, MD;  [facility] INVASIVE CV LAB;  Service: Cardiovascular;  Laterality: Right;  . COLONOSCOPY    . PERIPHERAL VASCULAR INTERVENTION  06/26/2020   Procedure: PERIPHERAL VASCULAR INTERVENTION;  Surgeon: Runell Gess, MD;  Location[facility] INVASIVE CV LAB;  Service: Cardiovascular;;  Right SFA  . TONSILLECTOMY      Family History  Problem Relation Age of Onset  . Hypertension Father   . Diabetes Father   . Stroke Father   . Colon cancer Brother     Social History   Socioeconomic History  . Marital status: Married    Spouse name: Not on file  . Number of children: Not on file  . Years of education: Not on file  . Highest education level: Not on file  Occupational History  . Not on file  Tobacco Use  . Smoking status: Former Smoker    Types: Cigarettes    Quit date: 10/23/2016    Years since quitting: 4.0  . Smokeless tobacco: Never Used  . Tobacco comment: per patient 5 cigarettes a day   Vaping Use  . Vaping Use: Never used  Substance and Sexual Activity  . Alcohol use: Yes    Comment: occasional  . Drug use: No  . Sexual activity: Not on file  Other Topics Concern  . Not on file  Social History Narrative   Work or School: associate in Building control surveyor Situation: living with wife      Spiritual Beliefs: Christian      Lifestyle: no regular exercise, diet described as fair            Social Determinants of Corporate investment banker Strain: Not on file  Food Insecurity: Food Insecurity Present  . Worried About Programme researcher, broadcasting/film/video in the Last Year: Sometimes true  . Ran Out of Food in the Last Year: Never true  Transportation Needs: No Transportation Needs  . Lack of Transportation (Medical): No  . Lack of Transportation (Non-Medical): No  Physical Activity: Not on file  Stress: Not on file  Social Connections: Not on file  Intimate Partner Violence: Not on  file    Past Medical History, Surgical history, Social history, and Family history were reviewed and updated as appropriate.   Please see review of systems for further details on the patient's review from today.   Review of Systems:  Review of Systems  Constitutional: Negative for chills, diaphoresis and fever.  HENT: Negative for trouble swallowing and voice change.   Respiratory: Negative for cough, chest tightness, shortness of breath and wheezing.   Cardiovascular: Positive for chest pain. Negative for palpitations.  Gastrointestinal: Negative for abdominal pain, constipation, diarrhea, nausea and vomiting.  Musculoskeletal: Negative for back pain and myalgias.  Neurological: Negative for dizziness, light-headedness and headaches.    Objective:   Physical Exam:  There were no vitals taken for this visit. ECOG: 1  Physical Exam Constitutional:      General: He is not  in acute distress.    Appearance: He is not diaphoretic.  HENT:     Head: Normocephalic and atraumatic.  Cardiovascular:     Rate and Rhythm: Normal rate and regular rhythm.     Heart sounds: Normal heart sounds. No murmur heard. No friction rub. No gallop.   Pulmonary:     Effort: Pulmonary effort is normal. No respiratory distress.     Breath sounds: Normal breath sounds. No wheezing or rales.  Skin:    General: Skin is warm and dry.     Findings: No erythema or rash.  Neurological:     Mental Status: He is alert.  Psychiatric:        Mood and Affect: Mood normal.        Behavior: Behavior normal.        Thought Content: Thought content normal.        Judgment: Judgment normal.     Lab Review:     Component Value Date/Time   NA 144 11/02/2020 0858   NA 145 (H) 09/26/2020 1045   K 3.3 (L) 11/02/2020 0858   CL 105 11/02/2020 0858   CO2 29 11/02/2020 0858   GLUCOSE 98 11/02/2020 0858   BUN 24 (H) 11/02/2020 0858   BUN 15 09/26/2020 1045   CREATININE 1.29 (H) 11/02/2020 0858   CREATININE  1.03 08/22/2020 0955   CALCIUM 7.8 (L) 11/02/2020 0858   CALCIUM 7.6 (L) 08/04/2020 0859   PROT 4.2 (L) 11/02/2020 0858   ALBUMIN 1.3 (L) 11/02/2020 0858   AST 17 11/02/2020 0858   ALT 17 11/02/2020 0858   ALT 30 08/22/2020 0955   ALKPHOS 78 11/02/2020 0858   BILITOT 0.4 11/02/2020 0858   GFRNONAA >60 11/02/2020 0858   GFRNONAA 79 08/22/2020 0955   GFRAA 82 09/26/2020 1045   GFRAA 91 08/22/2020 0955       Component Value Date/Time   WBC 3.1 (L) 11/02/2020 0858   WBC 4.1 10/20/2020 0311   RBC 4.13 (L) 11/02/2020 0858   HGB 12.6 (L) 11/02/2020 0858   HGB 14.2 06/23/2020 1034   HCT 39.0 11/02/2020 0858   HCT 43.7 06/23/2020 1034   PLT 198 11/02/2020 0858   PLT 351 06/23/2020 1034   MCV 94.4 11/02/2020 0858   MCV 90 06/23/2020 1034   MCH 30.5 11/02/2020 0858   MCHC 32.3 11/02/2020 0858   RDW 15.5 11/02/2020 0858   RDW 13.3 06/23/2020 1034   LYMPHSABS 0.9 11/02/2020 0858   MONOABS 0.4 11/02/2020 0858   EOSABS 0.0 11/02/2020 0858   BASOSABS 0.0 11/02/2020 0858   -------------------------------  Imaging from last 24 hours (if applicable):  Radiology interpretation: CT CORONARY MORPH W/CTA COR W/SCORE W/CA W/CM &/OR WO/CM  Addendum Date: 10/18/2020   ADDENDUM REPORT: 10/18/2020 18:29 HISTORY: Chest pain/anginal equiv, 53yr CHD risk 10-20%, not treadmill candidate EXAM: Cardiac/Coronary CT TECHNIQUE: The patient was scanned on a [employer]. PROTOCOL: A 120 kV prospective scan was triggered in the descending thoracic aorta at 111 HU's. Axial non-contrast 3 mm slices were carried out through the heart. The data set was analyzed on a dedicated work station and scored using the [place]. Gantry rotation speed was 250 msecs and collimation was 0.6 mm. Beta blockade and 0.8 mg of sl NTG was given. The 3D data set was reconstructed in 5% intervals of 35-75% of the R-R cycle. Diastolic phases were analyzed on a dedicated work station using MPR, MIP and VRT modes. The  patient received 37mL  OMNIPAQUE IOHEXOL 350 MG/ML SOLN of contrast. FINDINGS: Coronary calcium score: The patient's coronary artery calcium score is 135, which places the patient in the 60 percentile. Coronary arteries: Two separate ostia for left anterior descending and left circumflex coronary origins. Normal right coronary anatomy. Co-dominance Right Coronary Artery: Normal caliber vessel. There is a portion of heavily calcified mixed plaque in the proximal RCA with >70% stenosis. There is then a small caliber portion of the proximal to mid RCA with likely completely occluded mid RCA. The distal RCA appears to reconstitute via collaterals. Left Anterior Descending Coronary Artery: Arises directly from [facility] of aortic root. Normal caliber vessel. There is mixed calcified and noncalcified plaque just proximal to D1, with 1-24% stenosis. Gives rise to 2 diagonal branches. Distal LAD wraps apex Left Circumflex Artery: Arises directly from [facility] of aortic root. normal caliber vessel. Trivial plaque without significant stenosis. Gives rise to large OM branch. Aorta: Moderately enlarged, 48 mm at the mid ascending aorta (level of the PA bifurcation) measured double oblique. No calcifications. No dissection. Aortic Valve: No calcifications. Trileaflet. Other findings: Normal pulmonary vein drainage into the left atrium. Normal left atrial appendage without a thrombus. Enlarged pulmonary artery size (3.8 cm) Moderate pericardial effusion Evidence of pulmonary embolism, most prominent in the RPA IMPRESSION: 1. Evidence of pulmonary embolism. Dr. Gwenlyn Found notified, patient has been sent to ER for evaluation. Enlarged PA size. 2. Severe CAD, CADRADS = 4. CT FFR will be reported separately. Likely CTO mid RCA with scattered nonobstructive disease in the left circulation. 3. Coronary calcium score of 135. This was 21 percentile for age and sex matched control. 4. Co dominant circulation, with individual ostia for the LAD and LCX  arising from the [facility]. 5.  Moderate pericardial effusion 6.  Moderately enlarged ascending aorta. Findings communicated with Dr. Gwenlyn Found. Electronically Signed   By: Buford Dresser M.D.   On: 10/18/2020 18:29   Result Date: 10/18/2020 EXAM: OVER-READ INTERPRETATION  CT CHEST The following report is an over-read performed by radiologist Dr. Vinnie Langton of [facility], [place] on 10/18/2020. This over-read does not include interpretation of cardiac or coronary anatomy or pathology. The coronary calcium score/coronary CTA interpretation by the cardiologist is attached. COMPARISON:  None. FINDINGS: Aortic atherosclerosis with aneurysmal dilatation of the ascending thoracic aorta (4.8 cm in diameter). Small to moderate pericardial effusion. Multiple filling defects within the pulmonary arterial tree bilaterally, compatible with pulmonary embolism. Dilatation of the pulmonic trunk (3.9 cm in diameter). Moderate right-sided pleural effusion lying dependently. Within the visualized portions of the thorax there are no suspicious appearing pulmonary nodules or masses, there is no acute consolidative airspace disease, no left pleural effusion, no pneumothorax and no lymphadenopathy. Visualized portions of the upper abdomen are unremarkable. There are no aggressive appearing lytic or blastic lesions noted in the visualized portions of the skeleton. IMPRESSION: 1. Study is positive for bilateral pulmonary embolism which appear segmental in size. 2. Ground-glass attenuation in the right lower lobe in a distribution of an embolus. Although much of this may be related atelectasis, the possibility of some degree of alveolar hemorrhage from pulmonary infarction is not excluded. There is also a moderate right-sided pleural effusion lying dependently. 3. Small to moderate pericardial effusion. 4. Aortic atherosclerosis with aneurysmal dilatation of the ascending thoracic aorta (4.8 cm in diameter). Ascending thoracic  aortic aneurysm. Recommend semi-annual imaging followup by CTA or MRA and referral to cardiothoracic surgery if not already obtained. This recommendation follows 2010 ACCF/AHA/AATS/ACR/ASA/SCA/SCAI/SIR/STS/SVM Guidelines for the Diagnosis  and Management of Patients With Thoracic Aortic Disease. Circulation. 2010; [id number]. Aortic aneurysm NOS (ICD10-I71.9). 5. Dilatation of the pulmonic trunk (3.9 cm in diameter), which may suggest pulmonary arterial hypertension. These results were called by telephone at the time of interpretation on 10/18/2020 at 1:50 pm to provider [facility], who verbally acknowledged these results. Electronically Signed: By: Vinnie Langton M.D. On: 10/18/2020 13:51   CT CORONARY FRACTIONAL FLOW RESERVE DATA PREP  Result Date: 10/19/2020 EXAM: CT FFR ANALYSIS CLINICAL DATA:  Chest pain, nonspecific FINDINGS: FFRct analysis was performed on the original cardiac CT angiogram dataset. Diagrammatic representation of the FFRct analysis is provided in a separate PDF document in PACS. This dictation was created using the PDF document and an interactive 3D model of the results. 3D model is not available in the EMR/PACS. Normal FFR range is >0.80. 1. LAD: No significant stenosis in proximal portion. Gradual decrease in FFR to significant in distal LAD without significant stenosis. Proximal FFR = 0.99, Mid FFR = 0.85, Distal FFR = 0.65 2. LCX: No significant stenosis. Proximal FFR = 0.95, Distal FFR = 0.94. 3. RCA: Modeled chronic CTO in mid RCA. Proximal FFR = 0.93, CTO in mid RCA and beyond. IMPRESSION: 1. CT FFR analysis shows CTO in mid RCA and no focal significant stenosis in left system. No significant left main/likely separate ostia for LAD and LCx. Distant LAD gradually reaches FFR significance but without a focal stenosis. Electronically Signed   By: Buford Dresser M.D.   On: 10/19/2020 14:05   ECHOCARDIOGRAM COMPLETE  Result Date: 10/19/2020    ECHOCARDIOGRAM REPORT    Patient Name:   Samuel Butler Date of Exam: 10/19/2020 Medical Rec #:  [id number]       Height:       71.0 in Accession #:    [id number]      Weight:       236.1 lb Date of Birth:  10/29/1961       BSA:          2.263 m Patient Age:    54 years        BP:           125/94 mmHg Patient Gender: M               HR:           92 bpm. Exam Location:  Inpatient Procedure: 2D Echo Indications:    Pulmonary Embolus I26.99  History:        Patient has prior history of Echocardiogram examinations, most                 recent 08/05/2020. Risk Factors:Dyslipidemia, Diabetes and                 Hypertension.  Sonographer:    Mikki Santee RDCS (AE) Referring Phys: [id number] [place]  1. Compared to previous echo IVC is dilated. Ascending aorta is unchanged.  2. Left ventricular ejection fraction, by estimation, is 55 to 60%. The left ventricle has normal function. The left ventricle has no regional wall motion abnormalities. There is severe left ventricular hypertrophy. Indeterminate diastolic filling due to E-A fusion.  3. Right ventricular systolic function is normal. The right ventricular size is normal. There is mildly elevated pulmonary artery systolic pressure.  4. Left atrial size was moderately dilated.  5. Right atrial size was mildly dilated.  6. A small pericardial effusion is present.  7. The mitral valve is  normal in structure. Trivial mitral valve regurgitation.  8. The aortic valve is normal in structure. Aortic valve regurgitation is mild to moderate.  9. Aortic dilatation noted. There is mild dilatation of the aortic root, measuring 41 mm. There is moderate dilatation of the ascending aorta, measuring 47 mm. 10. The inferior vena cava is dilated in size with <50% respiratory variability, suggesting right atrial pressure of 15 mmHg. FINDINGS  Left Ventricle: Left ventricular ejection fraction, by estimation, is 55 to 60%. The left ventricle has normal function. The left ventricle has  no regional wall motion abnormalities. The left ventricular internal cavity size was normal in size. There is  severe left ventricular hypertrophy. Indeterminate diastolic filling due to E-A fusion. Right Ventricle: The right ventricular size is normal. No increase in right ventricular wall thickness. Right ventricular systolic function is normal. There is mildly elevated pulmonary artery systolic pressure. The tricuspid regurgitant velocity is 2.82  m/s, and with an assumed right atrial pressure of 8 mmHg, the estimated right ventricular systolic pressure is [id number]. Left Atrium: Left atrial size was moderately dilated. Right Atrium: Right atrial size was mildly dilated. Pericardium: A small pericardial effusion is present. Mitral Valve: The mitral valve is normal in structure. Trivial mitral valve regurgitation. Tricuspid Valve: The tricuspid valve is normal in structure. Tricuspid valve regurgitation is mild. Aortic Valve: The aortic valve is normal in structure. Aortic valve regurgitation is mild to moderate. Aortic regurgitation PHT measures 537 msec. Pulmonic Valve: The pulmonic valve was normal in structure. Pulmonic valve regurgitation is not visualized. Aorta: Aortic dilatation noted. There is mild dilatation of the aortic root, measuring 41 mm. There is moderate dilatation of the ascending aorta, measuring 47 mm. Venous: The inferior vena cava is dilated in size with less than 50% respiratory variability, suggesting right atrial pressure of 15 mmHg. IAS/Shunts: No atrial level shunt detected by color flow Doppler.  LEFT VENTRICLE PLAX 2D LVIDd:         4.70 cm  Diastology LVIDs:         3.20 cm  LV e' medial:    4.03 cm/s LV PW:         1.90 cm  LV E/e' medial:  22.3 LV IVS:        1.80 cm  LV e' lateral:   4.47 cm/s LVOT diam:     2.60 cm  LV E/e' lateral: 20.1 LV SV:         93 LV SV Index:   41 LVOT Area:     5.31 cm  RIGHT VENTRICLE RV S prime:     12.80 cm/s TAPSE (M-mode): 1.9 cm LEFT ATRIUM            Index       RIGHT ATRIUM           Index LA diam:      3.10 cm 1.37 cm/m  RA Area:     21.70 cm LA Vol (A2C): 88.0 ml 38.89 ml/m RA Volume:   68.80 ml  30.41 ml/m LA Vol (A4C): 92.3 ml 40.79 ml/m  AORTIC VALVE LVOT Vmax:   110.00 cm/s LVOT Vmean:  80.600 cm/s LVOT VTI:    0.176 m AI PHT:      537 msec  AORTA Ao Root diam: 4.10 cm MITRAL VALVE                TRICUSPID VALVE MV Area (PHT): 3.06 cm     TR Peak grad:  31.8 mmHg MV Decel Time: 248 msec     TR Vmax:        282.00 cm/s MV E velocity: 90.00 cm/s MV A velocity: 111.00 cm/s  SHUNTS MV E/A ratio:  0.81         Systemic VTI:  0.18 m                             Systemic Diam: 2.60 cm Dorris Carnes MD Electronically signed by Dorris Carnes MD Signature Date/Time: 10/19/2020/12:44:55 PM    Final

## 2020-11-07 ENCOUNTER — Ambulatory Visit (HOSPITAL_COMMUNITY)
Admission: RE | Admit: 2020-11-07 | Discharge: 2020-11-07 | Disposition: A | Discharge status: Home / Self Care | Payer: BC Managed Care – PPO | Source: Ambulatory Visit | Attending: Physician Assistant | Admitting: Physician Assistant

## 2020-11-07 ENCOUNTER — Other Ambulatory Visit: Payer: Self-pay

## 2020-11-07 DIAGNOSIS — R1084 Generalized abdominal pain: Secondary | ICD-10-CM | POA: Diagnosis present

## 2020-11-07 DIAGNOSIS — R197 Diarrhea, unspecified: Secondary | ICD-10-CM | POA: Diagnosis present

## 2020-11-07 DIAGNOSIS — G8929 Other chronic pain: Secondary | ICD-10-CM | POA: Diagnosis present

## 2020-11-07 DIAGNOSIS — R1314 Dysphagia, pharyngoesophageal phase: Secondary | ICD-10-CM | POA: Insufficient documentation

## 2020-11-07 DIAGNOSIS — R140 Abdominal distension (gaseous): Secondary | ICD-10-CM | POA: Insufficient documentation

## 2020-11-09 ENCOUNTER — Inpatient Hospital Stay: Payer: BC Managed Care – PPO

## 2020-11-09 ENCOUNTER — Other Ambulatory Visit: Payer: Self-pay

## 2020-11-09 ENCOUNTER — Telehealth: Payer: Self-pay

## 2020-11-09 VITALS — BP 118/84 | HR 100 | Temp 98.8°F | Resp 18 | Ht 71.0 in | Wt 230.8 lb

## 2020-11-09 DIAGNOSIS — Z8249 Family history of ischemic heart disease and other diseases of the circulatory system: Secondary | ICD-10-CM | POA: Diagnosis not present

## 2020-11-09 DIAGNOSIS — Z87891 Personal history of nicotine dependence: Secondary | ICD-10-CM | POA: Diagnosis not present

## 2020-11-09 DIAGNOSIS — Z833 Family history of diabetes mellitus: Secondary | ICD-10-CM | POA: Diagnosis not present

## 2020-11-09 DIAGNOSIS — R5383 Other fatigue: Secondary | ICD-10-CM | POA: Diagnosis not present

## 2020-11-09 DIAGNOSIS — E785 Hyperlipidemia, unspecified: Secondary | ICD-10-CM | POA: Diagnosis not present

## 2020-11-09 DIAGNOSIS — I209 Angina pectoris, unspecified: Secondary | ICD-10-CM | POA: Diagnosis not present

## 2020-11-09 DIAGNOSIS — Z79899 Other long term (current) drug therapy: Secondary | ICD-10-CM | POA: Diagnosis not present

## 2020-11-09 DIAGNOSIS — E8581 Light chain (AL) amyloidosis: Secondary | ICD-10-CM

## 2020-11-09 DIAGNOSIS — Z823 Family history of stroke: Secondary | ICD-10-CM | POA: Diagnosis not present

## 2020-11-09 DIAGNOSIS — I10 Essential (primary) hypertension: Secondary | ICD-10-CM | POA: Diagnosis not present

## 2020-11-09 DIAGNOSIS — R9431 Abnormal electrocardiogram [ECG] [EKG]: Secondary | ICD-10-CM | POA: Diagnosis not present

## 2020-11-09 DIAGNOSIS — K589 Irritable bowel syndrome without diarrhea: Secondary | ICD-10-CM | POA: Diagnosis not present

## 2020-11-09 DIAGNOSIS — E859 Amyloidosis, unspecified: Secondary | ICD-10-CM | POA: Diagnosis not present

## 2020-11-09 DIAGNOSIS — E119 Type 2 diabetes mellitus without complications: Secondary | ICD-10-CM | POA: Diagnosis not present

## 2020-11-09 DIAGNOSIS — I700 Atherosclerosis of aorta: Secondary | ICD-10-CM | POA: Diagnosis not present

## 2020-11-09 DIAGNOSIS — R21 Rash and other nonspecific skin eruption: Secondary | ICD-10-CM | POA: Diagnosis not present

## 2020-11-09 DIAGNOSIS — Z5112 Encounter for antineoplastic immunotherapy: Secondary | ICD-10-CM | POA: Diagnosis not present

## 2020-11-09 DIAGNOSIS — Z800 Family history of malignant neoplasm of digestive organs: Secondary | ICD-10-CM | POA: Diagnosis not present

## 2020-11-09 DIAGNOSIS — Z7952 Long term (current) use of systemic steroids: Secondary | ICD-10-CM | POA: Diagnosis not present

## 2020-11-09 DIAGNOSIS — Z5111 Encounter for antineoplastic chemotherapy: Secondary | ICD-10-CM | POA: Diagnosis present

## 2020-11-09 DIAGNOSIS — K625 Hemorrhage of anus and rectum: Secondary | ICD-10-CM | POA: Diagnosis not present

## 2020-11-09 DIAGNOSIS — I2699 Other pulmonary embolism without acute cor pulmonale: Secondary | ICD-10-CM | POA: Diagnosis not present

## 2020-11-09 LAB — CMP (CANCER CENTER ONLY)
ALT: 19 U/L (ref 0–44)
AST: 25 U/L (ref 15–41)
Albumin: 1.4 g/dL — ABNORMAL LOW (ref 3.5–5.0)
Alkaline Phosphatase: 80 U/L (ref 38–126)
Anion gap: 6 (ref 5–15)
BUN: 19 mg/dL (ref 6–20)
CO2: 33 mmol/L — ABNORMAL HIGH (ref 22–32)
Calcium: 7.4 mg/dL — ABNORMAL LOW (ref 8.9–10.3)
Chloride: 104 mmol/L (ref 98–111)
Creatinine: 1.12 mg/dL (ref 0.61–1.24)
GFR, Estimated: >60 mL/min (ref >60)
Glucose, Bld: 119 mg/dL — ABNORMAL HIGH (ref 70–99)
Potassium: 2.8 mmol/L — ABNORMAL LOW (ref 3.5–5.1)
Sodium: 143 mmol/L (ref 135–145)
Total Bilirubin: 0.5 mg/dL (ref 0.3–1.2)
Total Protein: 4.3 g/dL — ABNORMAL LOW (ref 6.5–8.1)

## 2020-11-09 LAB — CBC WITH DIFFERENTIAL (CANCER CENTER ONLY)
Abs Immature Granulocytes: 0.01 10*3/uL (ref 0.00–0.07)
Basophils Absolute: 0 10*3/uL (ref 0.0–0.1)
Basophils Relative: 1 %
Eosinophils Absolute: 0 10*3/uL (ref 0.0–0.5)
Eosinophils Relative: 1 %
HCT: 38 % — ABNORMAL LOW (ref 39.0–52.0)
Hemoglobin: 12.7 g/dL — ABNORMAL LOW (ref 13.0–17.0)
Immature Granulocytes: 0 %
Lymphocytes Relative: 24 %
Lymphs Abs: 1.3 10*3/uL (ref 0.7–4.0)
MCH: 30.4 pg (ref 26.0–34.0)
MCHC: 33.4 g/dL (ref 30.0–36.0)
MCV: 90.9 fL (ref 80.0–100.0)
Monocytes Absolute: 0.5 10*3/uL (ref 0.1–1.0)
Monocytes Relative: 9 %
Neutro Abs: 3.5 10*3/uL (ref 1.7–7.7)
Neutrophils Relative %: 65 %
Platelet Count: 117 10*3/uL — ABNORMAL LOW (ref 150–400)
RBC: 4.18 MIL/uL — ABNORMAL LOW (ref 4.22–5.81)
RDW: 15.9 % — ABNORMAL HIGH (ref 11.5–15.5)
WBC Count: 5.4 10*3/uL (ref 4.0–10.5)
nRBC: 0 % (ref 0.0–0.2)

## 2020-11-09 LAB — LACTATE DEHYDROGENASE: LDH: 351 U/L — ABNORMAL HIGH (ref 98–192)

## 2020-11-09 MED ORDER — ACETAMINOPHEN 325 MG PO TABS
650.0000 mg | ORAL_TABLET | Freq: Once | ORAL | Status: AC
  Administered 2020-11-09: 10:00:00 650 mg via ORAL

## 2020-11-09 MED ORDER — BORTEZOMIB CHEMO SQ INJECTION 3.5 MG (2.5MG/ML)
1.3000 mg/m2 | Freq: Once | INTRAMUSCULAR | Status: AC
  Administered 2020-11-09: 11:00:00 3 mg via SUBCUTANEOUS
  Filled 2020-11-09: qty 1.2

## 2020-11-09 MED ORDER — DEXAMETHASONE 4 MG PO TABS
ORAL_TABLET | ORAL | Status: AC
  Filled 2020-11-09: qty 10

## 2020-11-09 MED ORDER — DIPHENHYDRAMINE HCL 25 MG PO CAPS
50.0000 mg | ORAL_CAPSULE | Freq: Once | ORAL | Status: AC
Start: 2020-11-09 — End: 2020-11-09
  Administered 2020-11-09: 10:00:00 50 mg via ORAL

## 2020-11-09 MED ORDER — DEXAMETHASONE 4 MG PO TABS
40.0000 mg | ORAL_TABLET | Freq: Once | ORAL | Status: AC
  Administered 2020-11-09: 10:00:00 40 mg via ORAL

## 2020-11-09 MED ORDER — SODIUM CHLORIDE 0.9 % IV SOLN
INTRAVENOUS | Status: DC
  Filled 2020-11-09: qty 250

## 2020-11-09 MED ORDER — SODIUM CHLORIDE 0.9 % IV SOLN
300.0000 mg/m2 | Freq: Once | INTRAVENOUS | Status: AC
  Administered 2020-11-09: 11:00:00 680 mg via INTRAVENOUS
  Filled 2020-11-09: qty 34

## 2020-11-09 MED ORDER — ACETAMINOPHEN 325 MG PO TABS
ORAL_TABLET | ORAL | Status: AC
  Filled 2020-11-09: qty 2

## 2020-11-09 MED ORDER — DARATUMUMAB-HYALURONIDASE-FIHJ 1800-30000 MG-UT/15ML ~~LOC~~ SOLN
1800.0000 mg | Freq: Once | SUBCUTANEOUS | Status: AC
  Administered 2020-11-09: 12:00:00 1800 mg via SUBCUTANEOUS
  Filled 2020-11-09: qty 15

## 2020-11-09 MED ORDER — DIPHENHYDRAMINE HCL 25 MG PO CAPS
ORAL_CAPSULE | ORAL | Status: AC
  Filled 2020-11-09: qty 2

## 2020-11-09 NOTE — Patient Instructions (Signed)
[facility] Discharge Instructions for Patients Receiving Chemotherapy  Today you received the following chemotherapy agents Cyclophosphamide (CYTOXAN), Bortezomib (VELCADE) & Daratumumab-hyaluronidase-fihj (DARZALEX FASPRO).  To help prevent nausea and vomiting after your treatment, we encourage you to take your nausea medication as prescribed.   If you develop nausea and vomiting that is not controlled by your nausea medication, call the clinic.   BELOW ARE SYMPTOMS THAT SHOULD BE REPORTED IMMEDIATELY:  *FEVER GREATER THAN 100.5 F  *CHILLS WITH OR WITHOUT FEVER  NAUSEA AND VOMITING THAT IS NOT CONTROLLED WITH YOUR NAUSEA MEDICATION  *UNUSUAL SHORTNESS OF BREATH  *UNUSUAL BRUISING OR BLEEDING  TENDERNESS IN MOUTH AND THROAT WITH OR WITHOUT PRESENCE OF ULCERS  *URINARY PROBLEMS  *BOWEL PROBLEMS  UNUSUAL RASH Items with * indicate a potential emergency and should be followed up as soon as possible.  Feel free to call the clinic should you have any questions or concerns. The clinic phone number is [phone].  Please show the CHEMO ALERT CARD at check-in to the Emergency Department and triage nurse.   

## 2020-11-09 NOTE — Telephone Encounter (Signed)
Lm on vm for patient to return call with a symptom update after E. Coli treatment.

## 2020-11-09 NOTE — Telephone Encounter (Signed)
-----   Message from Missy Sabins, RN sent at 10/31/2020 10:48 AM EST ----- Regarding: Update Update after treatment for E. Coli - see stool study result note

## 2020-11-13 NOTE — Progress Notes (Signed)
Cardiology Clinic Note   Patient Name: Samuel Butler Date of Encounter: 11/14/2020  Primary Care Provider:  Isaac Bliss, Rayford Halsted, MD Primary Cardiologist:  Quay Burow, MD  Patient Profile    Locke A Pucciarelli 60 year old male presents to the clinic today for follow-up evaluation of his chest pain, essential hypertension, and peripheral arterial disease.  Past Medical History    Past Medical History:  Diagnosis Date  . DDD (degenerative disc disease), lumbar    per his report  . Diabetes mellitus without complication ([place])   . Gout 04/16/2013  . Hyperlipidemia   . Hypertension   . IBD (inflammatory bowel disease) 04/05/2016  . Pulmonary embolism ([place])   . Tobacco use disorder 03/28/2015   Past Surgical History:  Procedure Laterality Date  . ABDOMINAL AORTOGRAM W/LOWER EXTREMITY Right 06/26/2020   Procedure: ABDOMINAL AORTOGRAM W/LOWER EXTREMITY;  Surgeon: Lorretta Harp, MD;  Location: [facility];  Service: Cardiovascular;  Laterality: Right;  . COLONOSCOPY    . PERIPHERAL VASCULAR INTERVENTION  06/26/2020   Procedure: PERIPHERAL VASCULAR INTERVENTION;  Surgeon: Lorretta Harp, MD;  Location: [facility];  Service: Cardiovascular;;  Right SFA  . TONSILLECTOMY      Allergies  Allergies  Allergen Reactions  . Crestor [Rosuvastatin Calcium] Other (See Comments)    Joint aches    History of Present Illness   Mr. Pagano has a PMH of essential hypertension, PAD, hepatitis B, type 2 diabetes, AKI, hypokalemia, and vitamin D deficiency.  Dr. Gwenlyn Found performed angiography on 06/26/2020 showing high-grade proximal and mid right SFA disease.  Revascularization was performed using right common femoral antegrade approach.  A 90% mid left SFA and 95-99% left popliteal lesion with one-vessel runoff was treated.  His symptoms of claudication were much improved after intervention.  He denied left leg claudication.  He reported right groin pain.  Ultrasound was  ordered and ruled out pseudoaneurysm.  He was found to have an inguinal hernia.  During his 09/26/20 visit he complained of occasional chest pain which was severe at times.  A coronary CTA was ordered and showed pulmonary embolism.  His coronary calcium score was 135 which was  60 percentile for age and sex matched control.  A likely CTO of the mid RCA with scattered nonobstructive disease in the left circulation.  He was contacted and asked to present to the emergency department for treatment of his PE.  Contacted the nurse triage line on 11/02/2020 with reports of chest discomfort while at the cancer center.  He was instructed to increase his Imdur to 60 mg daily at that time.  He presents to the clinic today for follow-up evaluation states he has not noticed much of a difference with increasing his Imdur to 60 mg daily.  He reports having chest discomfort at rest when he lays on his left side.  He describes this as an aching type pain 3 out of 10.  He has noticed shortness of breath with increased physical activity and increased lower extremity swelling.  He denies chest pain with increased physical activity.  We reviewed his previous echocardiogram and he expressed understanding.  He reports that he is only been taking his torsemide once daily instead of twice daily.  I reviewed his medications and stressed compliance.  He reports compliance with his potassium.  I will order a BMP in 2 weeks, order a fasting lipid panel in 2 weeks, give him a salty 6 diet sheet, give the [place] support  stocking sheet, and have him follow-up in 2 weeks.  Today he denies chest pain, shortness of breath, increased lower extremity edema, fatigue, palpitations, melena, hematuria, hemoptysis, diaphoresis, weakness, presyncope, syncope, orthopnea, and PND.   Home Medications    Prior to Admission medications   Medication Sig Start Date End Date Taking? Authorizing Provider  acetaminophen (TYLENOL) 650 MG CR tablet Take  1,300 mg by mouth every 8 (eight) hours as needed for pain.     [provider]  albuterol (VENTOLIN HFA) 108 (90 Base) MCG/ACT inhaler Inhale 2 puffs into the lungs every 6 (six) hours as needed for wheezing or shortness of breath. 09/28/20   Orson Slick, MD  APIXABAN Arne Cleveland) VTE STARTER PACK (10MG  AND 5MG ) Take as directed on package: start with two-5mg  tablets twice daily for 7 days. On day 8, switch to one-5mg  tablet twice daily. 10/20/20   Mikhail, Velta Addison, DO  atorvastatin (LIPITOR) 80 MG tablet Take 1 tablet (80 mg total) by mouth daily. 08/31/20   Isaac Bliss, Rayford Halsted, MD  ciprofloxacin (CIPRO) 500 MG tablet Take 1 tablet (500 mg total) by mouth 2 (two) times daily. 10/31/20   Levin Erp, PA  clopidogrel (PLAVIX) 75 MG tablet Take 1 tablet (75 mg total) by mouth daily with breakfast. 06/27/20   Tommie Raymond, NP  dicyclomine (BENTYL) 10 MG capsule Take 1 capsule (10 mg total) by mouth 4 (four) times daily. Take 4 times daily before meals and at bedtime 10/24/20   Levin Erp, PA  ergocalciferol (VITAMIN D2) 1.25 MG (50000 UT) capsule Take 1 capsule (50,000 Units total) by mouth once a week. Patient taking differently: Take 50,000 Units by mouth every Thursday. 08/31/20   Isaac Bliss, Rayford Halsted, MD  isosorbide mononitrate (IMDUR) 60 MG 24 hr tablet Take 1 tablet (60 mg total) by mouth daily. 11/02/20   O'NealCassie Freer, MD  losartan (COZAAR) 50 MG tablet Take 1 tablet (50 mg total) by mouth daily. 07/25/20   Lorretta Harp, MD  metFORMIN (GLUCOPHAGE) 500 MG tablet Take 1 tablet (500 mg total) by mouth daily with breakfast. Restart on 10/21/2020 10/20/20   Cristal Ford, DO  ondansetron (ZOFRAN) 8 MG tablet TAKE 1 TABLET BY MOUTH EVERY 8 HOURS AS NEEDED FOR NAUSEA OR VOMITING 10/27/20   Orson Slick, MD  pantoprazole (PROTONIX) 40 MG tablet Take 1 tablet (40 mg total) by mouth daily. Patient taking differently: Take 40 mg by mouth  daily before breakfast. 07/14/20   Isaac Bliss, Rayford Halsted, MD  potassium chloride SA (KLOR-CON) 20 MEQ tablet Take 1 tablet (20 mEq total) by mouth 2 (two) times daily. 10/27/20   Orson Slick, MD  torsemide (DEMADEX) 20 MG tablet Take 20 mg by mouth 2 (two) times daily. 08/19/20   [provider]  VEMLIDY 25 MG TABS Take 25 mg by mouth daily.  09/25/20   [provider]    Family History    Family History  Problem Relation Age of Onset  . Hypertension Father   . Diabetes Father   . Stroke Father   . Colon cancer Brother    He indicated that the status of his father is unknown. He indicated that the status of his brother is unknown.  Social History    Social History   Socioeconomic History  . Marital status: Married    Spouse name: Not on file  . Number of children: Not on file  . Years of  education: Not on file  . Highest education level: Not on file  Occupational History  . Not on file  Tobacco Use  . Smoking status: Former Smoker    Types: Cigarettes    Quit date: 10/23/2016    Years since quitting: 4.0  . Smokeless tobacco: Never Used  . Tobacco comment: per patient 5 cigarettes a day   Vaping Use  . Vaping Use: Never used  Substance and Sexual Activity  . Alcohol use: Yes    Comment: occasional  . Drug use: No  . Sexual activity: Not on file  Other Topics Concern  . Not on file  Social History Narrative   Work or School: associate in Building control surveyor Situation: living with wife      Spiritual Beliefs: Christian      Lifestyle: no regular exercise, diet described as fair            Social Determinants of Corporate investment banker Strain: Not on file  Food Insecurity: Food Insecurity Present  . Worried About Programme researcher, broadcasting/film/video in the Last Year: Sometimes true  . Ran Out of Food in the Last Year: Never true  Transportation Needs: No Transportation Needs  . Lack of Transportation (Medical): No  . Lack of Transportation  (Non-Medical): No  Physical Activity: Not on file  Stress: Not on file  Social Connections: Not on file  Intimate Partner Violence: Not on file     Review of Systems    General:  No chills, fever, night sweats or weight changes.  Cardiovascular:  No chest pain, dyspnea on exertion, edema, orthopnea, palpitations, paroxysmal nocturnal dyspnea. Dermatological: No rash, lesions/masses Respiratory: No cough, dyspnea Urologic: No hematuria, dysuria Abdominal:   No nausea, vomiting, diarrhea, bright red blood per rectum, melena, or hematemesis Neurologic:  No visual changes, wkns, changes in mental status. All other systems reviewed and are otherwise negative except as noted above.  Physical Exam    VS:  BP 120/89 (BP Location: Right Arm, Patient Position: Sitting, Cuff Size: Normal)   Pulse 84   Ht 5' 11.5" (1.816 m)   Wt 229 lb (103.9 kg)   SpO2 96%   BMI 31.49 kg/m  , BMI Body mass index is 31.49 kg/m. GEN: Well nourished, well developed, in no acute distress. HEENT: normal. Neck: Supple, no JVD, carotid bruits, or masses. Cardiac: RRR, no murmurs, rubs, or gallops. No clubbing, cyanosis, +2 bilateral lower extremity pitting edema.  Radials/DP/PT 2+ and equal bilaterally.  Respiratory:  Respirations regular and unlabored, clear to auscultation bilaterally. GI: Soft, nontender, nondistended, BS + x 4. MS: no deformity or atrophy. Skin: warm and dry, no rash. Neuro:  Strength and sensation are intact. Psych: Normal affect.  Accessory Clinical Findings    Recent Labs: 05/25/2020: TSH 1.52 10/18/2020: B Natriuretic Peptide 638.2 11/09/2020: ALT 19; BUN 19; Creatinine 1.12; Hemoglobin 12.7; Platelet Count 117; Potassium 2.8; Sodium 143   Recent Lipid Panel    Component Value Date/Time   CHOL 255 (H) 08/06/2020 0417   TRIG 74 08/06/2020 0417   HDL 49 08/06/2020 0417   CHOLHDL 5.2 08/06/2020 0417   VLDL 15 08/06/2020 0417   LDLCALC 191 (H) 08/06/2020 0417   LDLCALC 278 (H)  05/25/2020 0849   LDLDIRECT 144.7 10/08/2013 0822    ECG personally reviewed by me today-normal sinus rhythm left axis deviation anterior infarct undetermined age T wave abnormality 84 bpm- No acute changes  Coronary CTA 10/18/2020 IMPRESSION:  1. Evidence of pulmonary embolism. Dr. Gwenlyn Found notified, patient has been sent to ER for evaluation. Enlarged PA size.  2. Severe CAD, CADRADS = 4. CT FFR will be reported separately. Likely CTO mid RCA with scattered nonobstructive disease in the left circulation.  3. Coronary calcium score of 135. This was 32 percentile for age and sex matched control.  4. Co dominant circulation, with individual ostia for the LAD and LCX arising from the [facility].  5.  Moderate pericardial effusion  6.  Moderately enlarged ascending aorta.  Findings communicated with Dr. Gwenlyn Found.  Assessment & Plan   1.  Coronary artery disease-no chest pain today.  Describes chest discomfort at rest when he lays on his left side.  Underwent coronary CTA 10/18/2020 which showed nonobstructive CAD with a CTO of his mid RCA.  Moderate pericardial effusion.  A pulmonary embolus was identified.  Patient was contacted and instructed to report to the emergency department. Continue atorvastatin, Plavix, Eliquis Reduce Imdur 30 mg Heart healthy low-sodium diet-salty 6 given Increase physical activity as tolerated  Lower extremity edema-bilateral +2 pitting lower extremity edema.  Reported only taking torsemide once daily instead of twice daily.  Reinforced importance of medication compliance. Continue torsemide Fluid restriction 60-80 fluid ounces daily Heart healthy low-sodium diet-salty 6 diet given Order BMP in 2 weeks Lower extremity support stockings   Peripheral vascular disease-denies claudication.  Continues to increase physical activity. Continue atorvastatin, Plavix Heart healthy low-sodium diet-salty 6 given Increase physical activity as tolerated  Essential  hypertension-BP today 120/89.  Well-controlled at home Continue losartan, Imdur, torsemide, potassium Heart healthy low-sodium diet-salty 6 given Increase physical activity as tolerated  Hyperlipidemia-08/06/2020: Cholesterol 255; HDL 49; LDL Cholesterol 191; Triglycerides 74; VLDL 15 Continue atorvastatin Heart healthy low-sodium high-fiber diet Order fasting lipid panel in 2 weeks   Disposition: Follow-up with Dr. Gwenlyn Found or me in 2 weeks.   [name]. Tiari Andringa NP-C    11/14/2020, 11:45 AM [address] Office [phone] Fax [phone]  Notice: This dictation was prepared with Dragon dictation along with smaller phrase technology. Any transcriptional errors that result from this process are unintentional and may not be corrected upon review.

## 2020-11-14 ENCOUNTER — Ambulatory Visit (INDEPENDENT_AMBULATORY_CARE_PROVIDER_SITE_OTHER): Payer: BC Managed Care – PPO | Admitting: General Practice

## 2020-11-14 ENCOUNTER — Other Ambulatory Visit: Payer: Self-pay

## 2020-11-14 ENCOUNTER — Encounter: Payer: Self-pay | Admitting: General Practice

## 2020-11-14 VITALS — BP 120/89 | HR 84 | Ht 71.5 in | Wt 229.0 lb

## 2020-11-14 DIAGNOSIS — I739 Peripheral vascular disease, unspecified: Secondary | ICD-10-CM

## 2020-11-14 DIAGNOSIS — Z79899 Other long term (current) drug therapy: Secondary | ICD-10-CM | POA: Diagnosis not present

## 2020-11-14 DIAGNOSIS — R079 Chest pain, unspecified: Secondary | ICD-10-CM

## 2020-11-14 DIAGNOSIS — E785 Hyperlipidemia, unspecified: Secondary | ICD-10-CM

## 2020-11-14 DIAGNOSIS — I10 Essential (primary) hypertension: Secondary | ICD-10-CM

## 2020-11-14 DIAGNOSIS — I2510 Atherosclerotic heart disease of native coronary artery without angina pectoris: Secondary | ICD-10-CM | POA: Diagnosis not present

## 2020-11-14 MED ORDER — ISOSORBIDE MONONITRATE ER 30 MG PO TB24: 30.0000 mg | ORAL_TABLET | Freq: Every day | ORAL | Status: DC

## 2020-11-14 NOTE — Addendum Note (Signed)
Addended by: Alyson Ingles on: 11/14/2020 12:08 PM   Modules accepted: Orders

## 2020-11-14 NOTE — Telephone Encounter (Signed)
Lm on vm for patient to return call with symptom update.  

## 2020-11-14 NOTE — Patient Instructions (Signed)
Medication Instructions:  Make sure to take your Torsemide twice daily-EX...3at 8am and noon  DECREASE IMDUR 30MG  DAILY  *If you need a refill on your cardiac medications before your next appointment, please call your pharmacy*  Lab Work: FASTING LIPID AND BMET 2 DAYS BEFORE FOLLOW UP APPT. If you have labs (blood work) drawn today and your tests are completely normal, you will receive your results only by:  MyChart Message (if you have MyChart) OR A paper copy in the mail.  If you have any lab test that is abnormal or we need to change your treatment, we will call you to review the results. You may go to any [facility] that is convenient for you however, we do have a lab in our office that is able to assist you. You DO NOT need an appointment for our lab. The lab is open 8:00am and closes at 4:00pm. Lunch 12:45 - 1:45pm.  Special Instructions PLEASE PURCHASE AND WEAR COMPRESSION STOCKINGS DAILY AND OFF AT BEDTIME. Compression stockings are elastic socks that squeeze the legs. They help to increase blood flow to the legs and to decrease swelling in the legs from fluid retention, and reduce the chance of developing blood clots in the lower legs. Please put on in the AM when dressing and off at night when dressing for bed. [employer];  [address] ([address] [phone]); [place], [place]; [phone]  EMAIL   [email or web address]  PLEASE MAKE SURE TO ELEVATE YOUR FEET & LEGS WHILE SITTING, THIS WILL HELP WITH THE SWELLING ALSO.    PLEASE READ AND FOLLOW SALTY 6-ATTACHED-1,800 mg daily  FLUID RESTRICTION 60-80oz DAILY  Follow-Up: Your next appointment:  2 week(s) In Person with 01-[birth year]-1993, MD OR IF UNAVAILABLE JESSE CLEAVER, FNP-C   At [facility], you and your health needs are our priority.  As part of our continuing mission to provide you with exceptional heart care, we have created designated Provider Care Teams.  These Care Teams include your primary Cardiologist  (physician) and Advanced Practice Providers (APPs -  Physician Assistants and Nurse Practitioners) who all work together to provide you with the care you need, when you need it.

## 2020-11-15 ENCOUNTER — Other Ambulatory Visit: Payer: Self-pay

## 2020-11-16 ENCOUNTER — Encounter: Payer: Self-pay | Admitting: Internal Medicine

## 2020-11-16 ENCOUNTER — Telehealth: Payer: Self-pay | Admitting: Internal Medicine

## 2020-11-16 ENCOUNTER — Telehealth: Payer: Self-pay

## 2020-11-16 ENCOUNTER — Other Ambulatory Visit: Payer: Self-pay

## 2020-11-16 ENCOUNTER — Ambulatory Visit (INDEPENDENT_AMBULATORY_CARE_PROVIDER_SITE_OTHER): Payer: BC Managed Care – PPO | Admitting: Internal Medicine

## 2020-11-16 VITALS — BP 120/85 | HR 90 | Temp 98.4°F | Wt 228.4 lb

## 2020-11-16 DIAGNOSIS — E854 Organ-limited amyloidosis: Secondary | ICD-10-CM

## 2020-11-16 DIAGNOSIS — I10 Essential (primary) hypertension: Secondary | ICD-10-CM | POA: Diagnosis not present

## 2020-11-16 DIAGNOSIS — I2699 Other pulmonary embolism without acute cor pulmonale: Secondary | ICD-10-CM | POA: Diagnosis not present

## 2020-11-16 DIAGNOSIS — E785 Hyperlipidemia, unspecified: Secondary | ICD-10-CM

## 2020-11-16 DIAGNOSIS — I739 Peripheral vascular disease, unspecified: Secondary | ICD-10-CM | POA: Diagnosis not present

## 2020-11-16 DIAGNOSIS — Z09 Encounter for follow-up examination after completed treatment for conditions other than malignant neoplasm: Secondary | ICD-10-CM | POA: Diagnosis not present

## 2020-11-16 DIAGNOSIS — I152 Hypertension secondary to endocrine disorders: Secondary | ICD-10-CM

## 2020-11-16 DIAGNOSIS — E1159 Type 2 diabetes mellitus with other circulatory complications: Secondary | ICD-10-CM

## 2020-11-16 DIAGNOSIS — Z79899 Other long term (current) drug therapy: Secondary | ICD-10-CM

## 2020-11-16 LAB — LIPID PANEL
Cholesterol: 303 mg/dL — ABNORMAL HIGH (ref 0–200)
HDL: 49.8 mg/dL (ref >39.00)
LDL Cholesterol: 222 mg/dL — ABNORMAL HIGH (ref 0–99)
NonHDL: 253.34
Total CHOL/HDL Ratio: 6
Triglycerides: 158 mg/dL — ABNORMAL HIGH (ref 0.0–149.0)
VLDL: 31.6 mg/dL (ref 0.0–40.0)

## 2020-11-16 LAB — BASIC METABOLIC PANEL
BUN: 21 mg/dL (ref 6–23)
CO2: 36 mEq/L — ABNORMAL HIGH (ref 19–32)
Calcium: 7 mg/dL — ABNORMAL LOW (ref 8.4–10.5)
Chloride: 103 mEq/L (ref 96–112)
Creatinine, Ser: 1.18 mg/dL (ref 0.40–1.50)
GFR: 66.89 mL/min (ref >60.00)
Glucose, Bld: 105 mg/dL — ABNORMAL HIGH (ref 70–99)
Potassium: 2.7 mEq/L — CL (ref 3.5–5.1)
Sodium: 145 mEq/L (ref 135–145)

## 2020-11-16 MED ORDER — EZETIMIBE 10 MG PO TABS: 10.0000 mg | ORAL_TABLET | Freq: Every day | ORAL | 3 refills | Status: DC

## 2020-11-16 MED ORDER — POTASSIUM CHLORIDE CRYS ER 20 MEQ PO TBCR: 40.0000 meq | EXTENDED_RELEASE_TABLET | Freq: Two times a day (BID) | ORAL | 6 refills | Status: DC

## 2020-11-16 MED ORDER — APIXABAN 5 MG PO TABS: 5.0000 mg | ORAL_TABLET | Freq: Two times a day (BID) | ORAL | 1 refills | Status: DC

## 2020-11-16 NOTE — Telephone Encounter (Signed)
Scheduled Follow up appointment with Dr.Stark.

## 2020-11-16 NOTE — Telephone Encounter (Signed)
CRITICAL VALUE STICKER  CRITICAL VALUE: K low at 2.7  RECEIVER (on-site recipient of call): Donnel Saxon.  DATE & TIME NOTIFIED: 1.6.22 @ 2:45pm  MESSENGER (representative from lab): Donnel Saxon  MD NOTIFIED: Dr. Ardyth Harps  TIME OF NOTIFICATION: 2:48pm  RESPONSE:

## 2020-11-16 NOTE — Progress Notes (Signed)
Established Patient Office Visit     This visit occurred during the SARS-CoV-2 public health emergency.  Safety protocols were in place, including screening questions prior to the visit, additional usage of staff PPE, and extensive cleaning of exam room while observing appropriate contact time as indicated for disinfecting solutions.    CC/Reason for Visit: Follow-up chronic conditions, hospital follow-up  HPI: Samuel Butler is a 60 y.o. male who is coming in today for the above mentioned reasons. Past Medical History is significant for: Hypertension, hyperlipidemia, inflammatory bowel disease, well-controlled type 2 diabetes, peripheral artery disease status post stenting of his right SFA by Dr. Gwenlyn Found.  He has been diagnosed with amyloidosis and is undergoing care by oncology, Dr. Lorenso Courier.  Unfortunately he was hospitalized from December 8 through December 12 due to an acute pulmonary embolism.  He was initially placed on heparin and then transitioned over to Eliquis.  He is needing a refill today.  Hospital charts have been reviewed in detail.  He has already followed up with cardiology.  He has another amyloidosis treatment scheduled for tomorrow.  He is due for a flu vaccine but declines today.  His cardiologist had ordered lipids to be rechecked however he was not fasting, he is asking for these to be done today.  He continues to complain of bilateral lower extremity swelling but other than this has been doing well.   Past Medical/Surgical History: Past Medical History:  Diagnosis Date  . DDD (degenerative disc disease), lumbar    per his report  . Diabetes mellitus without complication ([place])   . Gout 04/16/2013  . Hyperlipidemia   . Hypertension   . IBD (inflammatory bowel disease) 04/05/2016  . Pulmonary embolism ([place])   . Tobacco use disorder 03/28/2015    Past Surgical History:  Procedure Laterality Date  . ABDOMINAL AORTOGRAM W/LOWER EXTREMITY Right 06/26/2020   Procedure:  ABDOMINAL AORTOGRAM W/LOWER EXTREMITY;  Surgeon: Lorretta Harp, MD;  Location: [facility];  Service: Cardiovascular;  Laterality: Right;  . COLONOSCOPY    . PERIPHERAL VASCULAR INTERVENTION  06/26/2020   Procedure: PERIPHERAL VASCULAR INTERVENTION;  Surgeon: Lorretta Harp, MD;  Location: [facility] [facility];  Service: Cardiovascular;;  Right SFA  . TONSILLECTOMY      Social History:  reports that he quit smoking about 4 years ago. His smoking use included cigarettes. He has never used smokeless tobacco. He reports current alcohol use. He reports that he does not use drugs.  Allergies: Allergies  Allergen Reactions  . Crestor [Rosuvastatin Calcium] Other (See Comments)    Joint aches    Family History:  Family History  Problem Relation Age of Onset  . Hypertension Father   . Diabetes Father   . Stroke Father   . Colon cancer Brother      Current Outpatient Medications:  .  acetaminophen (TYLENOL) 650 MG CR tablet, Take 1,300 mg by mouth every 8 (eight) hours as needed for pain. , Disp: , Rfl:  .  albuterol (VENTOLIN HFA) 108 (90 Base) MCG/ACT inhaler, Inhale 2 puffs into the lungs every 6 (six) hours as needed for wheezing or shortness of breath., Disp: 8 g, Rfl: 0 .  apixaban (ELIQUIS) 5 MG TABS tablet, Take 1 tablet (5 mg total) by mouth 2 (two) times daily., Disp: 180 tablet, Rfl: 1 .  atorvastatin (LIPITOR) 80 MG tablet, Take 1 tablet (80 mg total) by mouth daily., Disp: 90 tablet, Rfl: 2 .  ciprofloxacin (CIPRO)  500 MG tablet, Take 1 tablet (500 mg total) by mouth 2 (two) times daily., Disp: 6 tablet, Rfl: 0 .  clopidogrel (PLAVIX) 75 MG tablet, Take 1 tablet (75 mg total) by mouth daily with breakfast., Disp: 90 tablet, Rfl: 3 .  dicyclomine (BENTYL) 10 MG capsule, Take 1 capsule (10 mg total) by mouth 4 (four) times daily. Take 4 times daily before meals and at bedtime, Disp: 90 capsule, Rfl: 0 .  ergocalciferol (VITAMIN D2) 1.25 MG (50000 UT) capsule, Take 1  capsule (50,000 Units total) by mouth once a week. (Patient taking differently: Take 50,000 Units by mouth every Thursday.), Disp: 12 capsule, Rfl: 0 .  isosorbide mononitrate (IMDUR) 30 MG 24 hr tablet, Take 1 tablet (30 mg total) by mouth daily., Disp: , Rfl:  .  losartan (COZAAR) 50 MG tablet, Take 1 tablet (50 mg total) by mouth daily., Disp: 90 tablet, Rfl: 3 .  metFORMIN (GLUCOPHAGE) 500 MG tablet, Take 1 tablet (500 mg total) by mouth daily with breakfast. Restart on 10/21/2020, Disp: , Rfl:  .  ondansetron (ZOFRAN) 8 MG tablet, TAKE 1 TABLET BY MOUTH EVERY 8 HOURS AS NEEDED FOR NAUSEA OR VOMITING, Disp: 30 tablet, Rfl: 0 .  pantoprazole (PROTONIX) 40 MG tablet, Take 1 tablet (40 mg total) by mouth daily. (Patient taking differently: Take 40 mg by mouth daily before breakfast.), Disp: 90 tablet, Rfl: 1 .  potassium chloride SA (KLOR-CON) 20 MEQ tablet, Take 1 tablet (20 mEq total) by mouth 2 (two) times daily., Disp: 28 tablet, Rfl: 0 .  torsemide (DEMADEX) 20 MG tablet, Take 20 mg by mouth 2 (two) times daily., Disp: , Rfl:  .  VEMLIDY 25 MG TABS, Take 25 mg by mouth daily. , Disp: , Rfl:   Review of Systems:  Constitutional: Denies fever, chills, diaphoresis, appetite change and fatigue.  HEENT: Denies photophobia, eye pain, redness, hearing loss, ear pain, congestion, sore throat, rhinorrhea, sneezing, mouth sores, trouble swallowing, neck pain, neck stiffness and tinnitus.   Respiratory: Denies SOB, DOE, cough, chest tightness,  and wheezing.   Cardiovascular: Denies chest pain, palpitations. Gastrointestinal: Denies nausea, vomiting, abdominal pain, diarrhea, constipation, blood in stool and abdominal distention.  Genitourinary: Denies dysuria, urgency, frequency, hematuria, flank pain and difficulty urinating.  Endocrine: Denies: hot or cold intolerance, sweats, changes in hair or nails, polyuria, polydipsia. Musculoskeletal: Denies myalgias, back pain, joint swelling, arthralgias and  gait problem.  Skin: Denies pallor, rash and wound.  Neurological: Denies dizziness, seizures, syncope, weakness, light-headedness, numbness and headaches.  Hematological: Denies adenopathy. Easy bruising, personal or family bleeding history  Psychiatric/Behavioral: Denies suicidal ideation, mood changes, confusion, nervousness, sleep disturbance and agitation    Physical Exam: Vitals:   11/16/20 1023  BP: 120/85  Pulse: 90  Temp: 98.4 F (36.9 C)  TempSrc: Oral  SpO2: 96%  Weight: 228 lb 6.4 oz (103.6 kg)    Body mass index is 31.41 kg/m.   Constitutional: NAD, calm, comfortable Eyes: PERRL, lids and conjunctivae normal ENMT: Mucous membranes are moist.  Respiratory: clear to auscultation bilaterally, no wheezing, no crackles. Normal respiratory effort. No accessory muscle use.  Cardiovascular: Regular rate and rhythm, no murmurs / rubs / gallops.  Significant, 3+ pitting lower extremity edema bilaterally. Neurologic: Grossly intact and nonfocal. Psychiatric: Normal judgment and insight. Alert and oriented x 3. Normal mood.    Impression and Plan:  Hospital discharge follow-up  Acute pulmonary embolism, unspecified pulmonary embolism type, unspecified whether acute cor pulmonale present ([place]) - Plan:  apixaban (ELIQUIS) 5 MG TABS tablet  Organ-limited amyloidosis (HCC)  Essential hypertension, benign  Hyperlipidemia, unspecified hyperlipidemia type  Peripheral arterial disease (HCC)  Hypertension associated with diabetes (HCC)  -He has overall been doing well, tolerating anticoagulation without issues for his newly diagnosed pulmonary embolism.  I will refill his Eliquis today.  He is following with Dr. Leonides Schanz for his amyloid treatments.  He declines his flu vaccine today.  He will need to return in 3 months for continued diabetic care.  Continue follow-up with cardiology as scheduled.  Time spent: 41 minutes Was spent in direct contact with the patient,  coordinating care and discussing relevant ongoing clinical issues, as well as reviewing hospital charts.    Patient Instructions  -Nice seeing you today!!  -Lab work today that has been ordered by cardiology.  -Schedule follow up in 3 months.     Chaya Jan, MD [facility]

## 2020-11-16 NOTE — Patient Instructions (Addendum)
-  Nice seeing you today!!  -Lab work today that has been ordered by cardiology.  -Schedule follow up in 3 months.

## 2020-11-17 ENCOUNTER — Inpatient Hospital Stay: Payer: BC Managed Care – PPO | Attending: Hematology and Oncology | Admitting: Hematology and Oncology

## 2020-11-17 ENCOUNTER — Inpatient Hospital Stay: Payer: BC Managed Care – PPO

## 2020-11-17 ENCOUNTER — Other Ambulatory Visit: Payer: Self-pay

## 2020-11-17 VITALS — BP 136/94 | HR 70 | Temp 97.8°F | Resp 18 | Ht 71.0 in | Wt 226.2 lb

## 2020-11-17 DIAGNOSIS — Z5112 Encounter for antineoplastic immunotherapy: Secondary | ICD-10-CM | POA: Diagnosis present

## 2020-11-17 DIAGNOSIS — Z5111 Encounter for antineoplastic chemotherapy: Secondary | ICD-10-CM | POA: Insufficient documentation

## 2020-11-17 DIAGNOSIS — R0602 Shortness of breath: Secondary | ICD-10-CM | POA: Insufficient documentation

## 2020-11-17 DIAGNOSIS — N049 Nephrotic syndrome with unspecified morphologic changes: Secondary | ICD-10-CM

## 2020-11-17 DIAGNOSIS — I10 Essential (primary) hypertension: Secondary | ICD-10-CM | POA: Insufficient documentation

## 2020-11-17 DIAGNOSIS — E8581 Light chain (AL) amyloidosis: Secondary | ICD-10-CM | POA: Diagnosis present

## 2020-11-17 DIAGNOSIS — E785 Hyperlipidemia, unspecified: Secondary | ICD-10-CM | POA: Diagnosis not present

## 2020-11-17 DIAGNOSIS — Z823 Family history of stroke: Secondary | ICD-10-CM | POA: Insufficient documentation

## 2020-11-17 DIAGNOSIS — Z800 Family history of malignant neoplasm of digestive organs: Secondary | ICD-10-CM | POA: Insufficient documentation

## 2020-11-17 DIAGNOSIS — K589 Irritable bowel syndrome without diarrhea: Secondary | ICD-10-CM | POA: Insufficient documentation

## 2020-11-17 DIAGNOSIS — R21 Rash and other nonspecific skin eruption: Secondary | ICD-10-CM | POA: Insufficient documentation

## 2020-11-17 DIAGNOSIS — Z833 Family history of diabetes mellitus: Secondary | ICD-10-CM | POA: Diagnosis not present

## 2020-11-17 DIAGNOSIS — Z8249 Family history of ischemic heart disease and other diseases of the circulatory system: Secondary | ICD-10-CM | POA: Diagnosis not present

## 2020-11-17 DIAGNOSIS — E854 Organ-limited amyloidosis: Secondary | ICD-10-CM

## 2020-11-17 DIAGNOSIS — Z79899 Other long term (current) drug therapy: Secondary | ICD-10-CM | POA: Insufficient documentation

## 2020-11-17 DIAGNOSIS — Z87891 Personal history of nicotine dependence: Secondary | ICD-10-CM | POA: Diagnosis not present

## 2020-11-17 DIAGNOSIS — R5383 Other fatigue: Secondary | ICD-10-CM | POA: Diagnosis not present

## 2020-11-17 LAB — CMP (CANCER CENTER ONLY)
ALT: 18 U/L (ref 0–44)
AST: 27 U/L (ref 15–41)
Albumin: 1.4 g/dL — ABNORMAL LOW (ref 3.5–5.0)
Alkaline Phosphatase: 84 U/L (ref 38–126)
Anion gap: 10 (ref 5–15)
BUN: 19 mg/dL (ref 6–20)
CO2: 31 mmol/L (ref 22–32)
Calcium: 7.7 mg/dL — ABNORMAL LOW (ref 8.9–10.3)
Chloride: 102 mmol/L (ref 98–111)
Creatinine: 1.02 mg/dL (ref 0.61–1.24)
GFR, Estimated: >60 mL/min (ref >60)
Glucose, Bld: 111 mg/dL — ABNORMAL HIGH (ref 70–99)
Potassium: 2.8 mmol/L — ABNORMAL LOW (ref 3.5–5.1)
Sodium: 143 mmol/L (ref 135–145)
Total Bilirubin: 0.6 mg/dL (ref 0.3–1.2)
Total Protein: 4.4 g/dL — ABNORMAL LOW (ref 6.5–8.1)

## 2020-11-17 LAB — CBC WITH DIFFERENTIAL (CANCER CENTER ONLY)
Abs Immature Granulocytes: 0.01 10*3/uL (ref 0.00–0.07)
Basophils Absolute: 0 10*3/uL (ref 0.0–0.1)
Basophils Relative: 1 %
Eosinophils Absolute: 0.1 10*3/uL (ref 0.0–0.5)
Eosinophils Relative: 1 %
HCT: 38.6 % — ABNORMAL LOW (ref 39.0–52.0)
Hemoglobin: 12.6 g/dL — ABNORMAL LOW (ref 13.0–17.0)
Immature Granulocytes: 0 %
Lymphocytes Relative: 36 %
Lymphs Abs: 1.5 10*3/uL (ref 0.7–4.0)
MCH: 30.7 pg (ref 26.0–34.0)
MCHC: 32.6 g/dL (ref 30.0–36.0)
MCV: 93.9 fL (ref 80.0–100.0)
Monocytes Absolute: 0.4 10*3/uL (ref 0.1–1.0)
Monocytes Relative: 9 %
Neutro Abs: 2.3 10*3/uL (ref 1.7–7.7)
Neutrophils Relative %: 53 %
Platelet Count: 162 10*3/uL (ref 150–400)
RBC: 4.11 MIL/uL — ABNORMAL LOW (ref 4.22–5.81)
RDW: 16.6 % — ABNORMAL HIGH (ref 11.5–15.5)
WBC Count: 4.3 10*3/uL (ref 4.0–10.5)
nRBC: 0 % (ref 0.0–0.2)

## 2020-11-17 LAB — LACTATE DEHYDROGENASE: LDH: 455 U/L — ABNORMAL HIGH (ref 98–192)

## 2020-11-17 MED ORDER — DEXAMETHASONE 4 MG PO TABS
40.0000 mg | ORAL_TABLET | Freq: Once | ORAL | Status: AC
  Administered 2020-11-17: 40 mg via ORAL

## 2020-11-17 MED ORDER — ACETAMINOPHEN 325 MG PO TABS
650.0000 mg | ORAL_TABLET | Freq: Once | ORAL | Status: AC
  Administered 2020-11-17: 650 mg via ORAL

## 2020-11-17 MED ORDER — DIPHENHYDRAMINE HCL 25 MG PO CAPS
ORAL_CAPSULE | ORAL | Status: AC
  Filled 2020-11-17: qty 2

## 2020-11-17 MED ORDER — DEXAMETHASONE 4 MG PO TABS
ORAL_TABLET | ORAL | Status: AC
  Filled 2020-11-17: qty 10

## 2020-11-17 MED ORDER — SODIUM CHLORIDE 0.9 % IV SOLN
300.0000 mg/m2 | Freq: Once | INTRAVENOUS | Status: AC
  Administered 2020-11-17: 680 mg via INTRAVENOUS
  Filled 2020-11-17: qty 34

## 2020-11-17 MED ORDER — BORTEZOMIB CHEMO SQ INJECTION 3.5 MG (2.5MG/ML)
1.3000 mg/m2 | Freq: Once | INTRAMUSCULAR | Status: AC
  Administered 2020-11-17: 3 mg via SUBCUTANEOUS
  Filled 2020-11-17: qty 1.2

## 2020-11-17 MED ORDER — SODIUM CHLORIDE 0.9 % IV SOLN
INTRAVENOUS | Status: DC
  Filled 2020-11-17: qty 250

## 2020-11-17 MED ORDER — DIPHENHYDRAMINE HCL 25 MG PO CAPS
50.0000 mg | ORAL_CAPSULE | Freq: Once | ORAL | Status: AC
Start: 2020-11-17 — End: 2020-11-17
  Administered 2020-11-17: 50 mg via ORAL

## 2020-11-17 MED ORDER — ACETAMINOPHEN 325 MG PO TABS
ORAL_TABLET | ORAL | Status: AC
  Filled 2020-11-17: qty 2

## 2020-11-17 MED ORDER — DARATUMUMAB-HYALURONIDASE-FIHJ 1800-30000 MG-UT/15ML ~~LOC~~ SOLN
1800.0000 mg | Freq: Once | SUBCUTANEOUS | Status: AC
  Administered 2020-11-17: 1800 mg via SUBCUTANEOUS
  Filled 2020-11-17: qty 15

## 2020-11-17 NOTE — Patient Instructions (Signed)
[name] Discharge Instructions for Patients Receiving Chemotherapy  Today you received the following chemotherapy agents Cyclophosphamide (CYTOXAN), Bortezomib (VELCADE) & Daratumumab-hyaluronidase-fihj (DARZALEX FASPRO).  To help prevent nausea and vomiting after your treatment, we encourage you to take your nausea medication as prescribed.   If you develop nausea and vomiting that is not controlled by your nausea medication, call the clinic.   BELOW ARE SYMPTOMS THAT SHOULD BE REPORTED IMMEDIATELY:  *FEVER GREATER THAN 100.5 F  *CHILLS WITH OR WITHOUT FEVER  NAUSEA AND VOMITING THAT IS NOT CONTROLLED WITH YOUR NAUSEA MEDICATION  *UNUSUAL SHORTNESS OF BREATH  *UNUSUAL BRUISING OR BLEEDING  TENDERNESS IN MOUTH AND THROAT WITH OR WITHOUT PRESENCE OF ULCERS  *URINARY PROBLEMS  *BOWEL PROBLEMS  UNUSUAL RASH Items with * indicate a potential emergency and should be followed up as soon as possible.  Feel free to call the clinic should you have any questions or concerns. The clinic phone number is [phone].  Please show the [place] at check-in to the Emergency Department and triage nurse.

## 2020-11-20 LAB — MULTIPLE MYELOMA PANEL, SERUM
Albumin SerPl Elph-Mcnc: 1.5 g/dL — ABNORMAL LOW (ref 2.9–4.4)
Albumin/Glob SerPl: 0.8 (ref 0.7–1.7)
Alpha 1: 0.2 g/dL (ref 0.0–0.4)
Alpha2 Glob SerPl Elph-Mcnc: 1 g/dL (ref 0.4–1.0)
B-Globulin SerPl Elph-Mcnc: 0.8 g/dL (ref 0.7–1.3)
Gamma Glob SerPl Elph-Mcnc: 0.1 g/dL — ABNORMAL LOW (ref 0.4–1.8)
Globulin, Total: 2.1 g/dL — ABNORMAL LOW (ref 2.2–3.9)
IgA: 36 mg/dL — ABNORMAL LOW (ref 90–386)
IgG (Immunoglobin G), Serum: 181 mg/dL — ABNORMAL LOW (ref 603–1613)
IgM (Immunoglobulin M), Srm: 29 mg/dL (ref 20–172)
M Protein SerPl Elph-Mcnc: 0.1 g/dL — ABNORMAL HIGH
Total Protein ELP: 3.6 g/dL — ABNORMAL LOW (ref 6.0–8.5)

## 2020-11-20 LAB — KAPPA/LAMBDA LIGHT CHAINS
Kappa free light chain: 12.1 mg/L (ref 3.3–19.4)
Kappa, lambda light chain ratio: 0.44 (ref 0.26–1.65)
Lambda free light chains: 27.4 mg/L — ABNORMAL HIGH (ref 5.7–26.3)

## 2020-11-21 ENCOUNTER — Encounter: Payer: Self-pay | Admitting: Hematology and Oncology

## 2020-11-21 DIAGNOSIS — Z5112 Encounter for antineoplastic immunotherapy: Secondary | ICD-10-CM | POA: Diagnosis not present

## 2020-11-21 NOTE — Progress Notes (Signed)
[facility] Telephone:([phone]   Fax:([phone]) [phone]  PROGRESS NOTE  Patient Care Team: Isaac Bliss, Rayford Halsted, MD as PCP - General (Internal Medicine) Lorretta Harp, MD as PCP - Cardiology (Cardiology)  Hematological/Oncological History # AL Amyloidosis of the Kidney 1) 08/03/2020: Free kappa 19.5, Lambda 205.7, Kappa/Lambda ratio: 0.09. M protein undetectable 2) 08/07/2020: kidney biopsy performed, results consistent with lambda light chain AL amyloidosis 3) 08/23/2020: establish care with Dr. Lorenso Courier  4) 09/01/2020: bone marrow biopsy shows plasma cell neoplasm with focal amyloid deposits 5) 09/29/2020: start of Cycle 1 Day 1 of Dara-CyBorD 6) 11/02/2020: Cycle 2 Day 1 of Dara-CyBorD  Interval History:  Samuel Butler 60 y.o. male with medical history significant for AL amyloidosis of the kidney presents for a follow up visit. The patient's last visit was on 10/27/2020 at which time he started treatment. In the interim since his last visit he has continued his treatment of Dara-CyBorD.   On exam today Mr. Leon notes that he is "doing better".  He reports that his treatments are going well and he is not having any issue with nausea or vomiting.  He reports that he has diarrhea that "comes and goes".  He notes that it is most prominent when he eats certain foods.  He denies having any numbness, tingling, or neuropathy in his hands or feet.  He also notes that he does have some occasional shortness of breath.  He does continue to have a pain in his lower left back but otherwise has been well.  He currently denies any fevers, chills, sweats.  A full 10 point ROS is listed below.  MEDICAL HISTORY:  Past Medical History:  Diagnosis Date  . DDD (degenerative disc disease), lumbar    per his report  . Diabetes mellitus without complication ([place])   . Gout 04/16/2013  . Hyperlipidemia   . Hypertension   . IBD (inflammatory bowel disease) 04/05/2016  . Pulmonary  embolism ([place])   . Tobacco use disorder 03/28/2015    SURGICAL HISTORY: Past Surgical History:  Procedure Laterality Date  . ABDOMINAL AORTOGRAM W/LOWER EXTREMITY Right 06/26/2020   Procedure: ABDOMINAL AORTOGRAM W/LOWER EXTREMITY;  Surgeon: Lorretta Harp, MD;  Location: [facility];  Service: Cardiovascular;  Laterality: Right;  . COLONOSCOPY    . PERIPHERAL VASCULAR INTERVENTION  06/26/2020   Procedure: PERIPHERAL VASCULAR INTERVENTION;  Surgeon: Lorretta Harp, MD;  Location: [facility];  Service: Cardiovascular;;  Right SFA  . TONSILLECTOMY      SOCIAL HISTORY: Social History   Socioeconomic History  . Marital status: Married    Spouse name: Not on file  . Number of children: Not on file  . Years of education: Not on file  . Highest education level: Not on file  Occupational History  . Not on file  Tobacco Use  . Smoking status: Former Smoker    Types: Cigarettes    Quit date: 10/23/2016    Years since quitting: 4.0  . Smokeless tobacco: Never Used  . Tobacco comment: per patient 5 cigarettes a day   Vaping Use  . Vaping Use: Never used  Substance and Sexual Activity  . Alcohol use: Yes    Comment: occasional  . Drug use: No  . Sexual activity: Not on file  Other Topics Concern  . 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 Worried About Charity fundraiser in the Last Year: Sometimes true  . Ran Out of Food in the Last Year: Never true  Transportation Needs: No Transportation Needs  . Lack of Transportation (Medical): No  . Lack of Transportation (Non-Medical): No  Physical Activity: Not on file  Stress: Not on file  Social Connections: Not  on file  Intimate Partner Violence: Not on file    FAMILY HISTORY: Family History  Problem Relation Age of Onset  . Hypertension Father   . Diabetes Father   . Stroke Father   . Colon cancer Brother     ALLERGIES:  is allergic to crestor [rosuvastatin calcium].  MEDICATIONS:  Current Outpatient Medications  Medication Sig Dispense Refill  . acetaminophen (TYLENOL) 650 MG CR tablet Take 1,300 mg by mouth every 8 (eight) hours as needed for pain.     Marland Kitchen albuterol (VENTOLIN HFA) 108 (90 Base) MCG/ACT inhaler Inhale 2 puffs into the lungs every 6 (six) hours as needed for wheezing or shortness of breath. 8 g 0  . apixaban (ELIQUIS) 5 MG TABS tablet Take 1 tablet (5 mg total) by mouth 2 (two) times daily. 180 tablet 1  . atorvastatin (LIPITOR) 80 MG tablet Take 1 tablet (80 mg total) by mouth daily. 90 tablet 2  . clopidogrel (PLAVIX) 75 MG tablet Take 1 tablet (75 mg total) by mouth daily with breakfast. 90 tablet 3  . dicyclomine (BENTYL) 10 MG capsule Take 1 capsule (10 mg total) by mouth 4 (four) times daily. Take 4 times daily before meals and at bedtime 90 capsule 0  . ergocalciferol (VITAMIN D2) 1.25 MG (50000 UT) capsule Take 1 capsule (50,000 Units total) by mouth once a week. (Patient taking differently: Take 50,000 Units by mouth every Thursday.) 12 capsule 0  . ezetimibe (ZETIA) 10 MG tablet Take 1 tablet (10 mg total) by mouth daily. 90 tablet 3  . isosorbide mononitrate (IMDUR) 30 MG 24 hr tablet Take 1 tablet (30 mg total) by mouth daily.    Marland Kitchen losartan (COZAAR) 50 MG tablet Take 1 tablet (50 mg total) by mouth daily. 90 tablet 3  . metFORMIN (GLUCOPHAGE) 500 MG tablet Take 1 tablet (500 mg total) by mouth daily with breakfast. Restart on 10/21/2020    . ondansetron (ZOFRAN) 8 MG tablet TAKE 1 TABLET BY MOUTH EVERY 8 HOURS AS NEEDED FOR NAUSEA OR VOMITING 30 tablet 0  . pantoprazole (PROTONIX) 40 MG tablet Take 1 tablet (40 mg total) by mouth daily. (Patient taking differently:  Take 40 mg by mouth daily before breakfast.) 90 tablet 1  . potassium chloride SA (KLOR-CON) 20 MEQ tablet Take 2 tablets (40 mEq total) by mouth 2 (two) times daily. 120 tablet 6  . torsemide (DEMADEX) 20 MG tablet Take 20 mg by mouth 2 (two) times daily.    . VEMLIDY 25 MG TABS Take 25 mg by mouth daily.      No current facility-administered medications for this visit.    REVIEW OF SYSTEMS:   Constitutional: ( - ) fevers, ( - )  chills , ( - ) night sweats Eyes: ( - ) blurriness of vision, ( - ) double vision, ( - ) watery eyes Ears,  nose, mouth, throat, and face: ( - ) mucositis, ( - ) sore throat Respiratory: ( - ) cough, ( - ) dyspnea, ( - ) wheezes Cardiovascular: ( - ) palpitation, ( - ) chest discomfort, ( - ) lower extremity swelling Gastrointestinal:  ( - ) nausea, ( - ) heartburn, ( - ) change in bowel habits Skin: ( - ) abnormal skin rashes Lymphatics: ( - ) new lymphadenopathy, ( - ) easy bruising Neurological: ( - ) numbness, ( - ) tingling, ( - ) new weaknesses Behavioral/Psych: ( - ) mood change, ( - ) new changes  All other systems were reviewed with the patient and are negative.  PHYSICAL EXAMINATION: ECOG PERFORMANCE STATUS: 1 - Symptomatic but completely ambulatory  Vitals:   11/17/20 1150  BP: (!) 136/94  Pulse: 70  Resp: 18  Temp: 97.8 F (36.6 C)  SpO2: 96%   Filed Weights   11/17/20 1150  Weight: 226 lb 3.2 oz (102.6 kg)    GENERAL: well appearing middle aged [place] American male alert, no distress and comfortable SKIN: skin color, texture, turgor are normal, no rashes or significant lesions EYES: conjunctiva are pink and non-injected, sclera clear LUNGS: clear to auscultation and percussion with normal breathing effort HEART: regular rate & rhythm and no murmurs and no lower extremity edema Musculoskeletal: no cyanosis of digits and no clubbing  PSYCH: alert & oriented x 3, fluent speech NEURO: no focal motor/sensory deficits  LABORATORY DATA:   I have reviewed the data as listed CBC Latest Ref Rng & Units 11/17/2020 11/09/2020 11/02/2020  WBC 4.0 - 10.5 K/uL 4.3 5.4 3.1(L)  Hemoglobin 13.0 - 17.0 g/dL 12.6(L) 12.7(L) 12.6(L)  Hematocrit 39.0 - 52.0 % 38.6(L) 38.0(L) 39.0  Platelets 150 - 400 K/uL 162 117(L) 198    CMP Latest Ref Rng & Units 11/17/2020 11/16/2020 11/09/2020  Glucose 70 - 99 mg/dL 111(H) 105(H) 119(H)  BUN 6 - 20 mg/dL 19 21 19   Creatinine 0.61 - 1.24 mg/dL 1.02 1.18 1.12  Sodium 135 - 145 mmol/L 143 145 143  Potassium 3.5 - 5.1 mmol/L 2.8(L) 2.7(LL) 2.8(L)  Chloride 98 - 111 mmol/L 102 103 104  CO2 22 - 32 mmol/L 31 36(H) 33(H)  Calcium 8.9 - 10.3 mg/dL 7.7(L) 7.0(L) 7.4(L)  Total Protein 6.5 - 8.1 g/dL 4.4(L) - 4.3(L)  Total Bilirubin 0.3 - 1.2 mg/dL 0.6 - 0.5  Alkaline Phos 38 - 126 U/L 84 - 80  AST 15 - 41 U/L 27 - 25  ALT 0 - 44 U/L 18 - 19    Lab Results  Component Value Date   MPROTEIN 0.1 (H) 11/17/2020   MPROTEIN 0.1 (H) 10/13/2020   MPROTEIN Not Observed 08/23/2020   Lab Results  Component Value Date   KPAFRELGTCHN 12.1 11/17/2020   KPAFRELGTCHN 10.0 10/13/2020   KPAFRELGTCHN 18.5 08/23/2020   LAMBDASER 27.4 (H) 11/17/2020   LAMBDASER 56.1 (H) 10/13/2020   LAMBDASER 199.1 (H) 08/23/2020   KAPLAMBRATIO 0.44 11/17/2020   KAPLAMBRATIO 0.36 (L) 10/16/2020   KAPLAMBRATIO 0.18 (L) 10/13/2020     RADIOGRAPHIC STUDIES: DG ESOPHAGUS W DOUBLE CM (HD)  Result Date: 11/07/2020 CLINICAL DATA:  Dysphagia.  Abdominal pain. EXAM: ESOPHOGRAM / BARIUM SWALLOW / BARIUM TABLET STUDY TECHNIQUE: Combined double contrast and single contrast examination performed using effervescent crystals, thick barium liquid, and thin barium liquid. The patient was observed with fluoroscopy swallowing a 13 mm barium sulphate tablet. FLUOROSCOPY TIME:  Fluoroscopy Time:  2 minutes and 6 seconds Radiation Exposure  Index (if provided by the fluoroscopic device): 40.4 mGy Number of Acquired Spot Images: 0 COMPARISON:  None.  FINDINGS: Initial barium swallows demonstrate normal pharyngeal motion with swallowing. No laryngeal penetration or aspiration. No upper esophageal webs, strictures or diverticuli. Normal esophageal motility. Smooth area of distal esophageal narrowing just above the esophageal vestibule. Findings suspicious for a mild reflux stricture. The 13 mm barium pill did pass through this area without difficulty. No hiatal hernia was identified. Minimal GE reflux noted with water swallowing. IMPRESSION: 1. Smooth area of distal esophageal narrowing just above the esophageal vestibule most likely a mild reflux stricture. 2. No esophageal mass. 3. Normal esophageal motility. 4. No hiatal hernia was demonstrated. Minimal GE reflux with water swallowing. Electronically Signed   By: Marijo Sanes M.D.   On: 11/07/2020 10:51    ASSESSMENT & PLAN Samuel Butler 60 y.o. male with medical history significant for AL amyloidosis of the kidney presents for a follow up visit.  After review the labs, the records, discussion with the patient the findings are most consistent with AL amyloidosis affecting the kidney.  As such I would recommended we start treatment with Dara CyBorD.  After reviewing the biopsy results his findings are most consistent with AL amyloidosis. As such the treatment of choice would be to target his plasma cell population with a triplet or quadruplet therapy. Therapy of choice in this case would consist of daratumumab, Velcade, cyclophosphamide, and dexamethasone. Given the patient's good functional status we will start with full dose Dara-CyBorD. I previously discussed the side effects of this chemotherapy with the patient including neuropathy, elevated blood pressure, drop in blood counts, hypersensitivity reaction, chest tightness, increased infection risk, and fatigue. The patient and family voiced their understanding of these findings and are agreeable to moving forward with quadruple therapy with the  goal of being a bridge to bone marrow transplant.   The regimen of choice will be daratumumab, bortezomib, cyclophosphamide and dexamethasone per the ANDROMEDA Study ( Blood. 2020 Jul 2;136(1):71-80). Treatment consists of: Cyclophosphamide 300 mg/m2 intravenously and bortezomib 1.3 mg/m2 subcutaneously were given on days 1, 8, 15, and 22 of each 28 day cycle for up to 6 cycles. Dexamethasone 40 mg (starting dose) was given orally or intravenously weekly for each cycle for up to 6 cycles. DARA [place] was administered in a single, premixed vial and given by manual [place] injection over the course of 3 to 5 minutes weekly in cycles 1 to 2, every 2 weeks in cycles 3 to 6, and every 4 weeks thereafter as monotherapy for a maximum of 2 years. We would only consider this continued dosing regimen if patient declined or was determined not to be a candidate for BMT.   On exam today Mr. Ells is been tolerating treatment well overall.   His hemoglobin is stable and otherwise his labs are acceptable for proceeding with treatment today.  #AL Amyloidosis Affecting the Kidney --bone marrow biopsy and kidney biopsy helped to confirm the diagnosis of AL amyloidosis. --currently being treated with  Dara-CyBorD (per the [facility] Trial), started Cycle 1 Day 1 on 09/29/2020.  --will monitoring monthly SPEP, UPEP, and SFLC while on treatment. --will have patient return for weekly treatment with q 2 week clinic visits initially.   #Injection Site Rash, improving --appears benign on exam today  --completed antibiotic therapy  #Supportive Care --chemotherapy education performed --zofran 52m q8H PRN and compazine 165mPO q6H for nausea --acyclovir 40051mO BID for VCZ prophylaxis --albuterol HFA inhaler  for daratumumab treatment -- EMLA cream for port not required, patient declines port at this time, but will reconsider once treatment starts -- no pain medication required at this time.   No orders of the defined types were  placed in this encounter.   All questions were answered. The patient knows to call the clinic with any problems, questions or concerns.  A total of more than 30 minutes were spent on this encounter and over half of that time was spent on counseling and coordination of care as outlined above.   Ledell Peoples, MD Department of Hematology/Oncology [place] [facility] Phone: [phone] Pager: [phone] Email: [email or web address]  11/21/2020 4:50 PM   Literature Support:  Wendie Chess SY, Weiss BM, Fredderick Severance, Comenzo RL. Daratumumab plus CyBorD for patients with newly diagnosed AL amyloidosis: safety run-in results of ANDROMEDA. Blood. 2020 Jul 2;136(1):71-80.  --Daratumumab-CyBorD was well tolerated, with no new safety concerns versus the intravenous formulation, and demonstrated robust hematologic and organ responses.

## 2020-11-21 NOTE — Telephone Encounter (Signed)
See patient message from 11/13/2020 with symptom update and recommendations.

## 2020-11-23 ENCOUNTER — Encounter: Payer: Self-pay | Admitting: Hematology and Oncology

## 2020-11-23 LAB — UPEP/UIFE/LIGHT CHAINS/TP, 24-HR UR
% BETA, Urine: 9.4 %
ALPHA 1 URINE: 9.2 %
Albumin, U: 76.6 %
Alpha 2, Urine: 4.2 %
Free Kappa Lt Chains,Ur: 15.28 mg/L (ref 0.63–113.79)
Free Kappa/Lambda Ratio: 0.76 — ABNORMAL LOW (ref 1.03–31.76)
Free Lambda Lt Chains,Ur: 20.11 mg/L — ABNORMAL HIGH (ref 0.47–11.77)
GAMMA GLOBULIN URINE: 0.7 %
Total Protein, Urine-Ur/day: 3916 mg/24 hr — ABNORMAL HIGH (ref 30–150)
Total Protein, Urine: 279.7 mg/dL
Total Volume: 1400

## 2020-11-24 ENCOUNTER — Other Ambulatory Visit: Payer: Self-pay

## 2020-11-24 ENCOUNTER — Inpatient Hospital Stay: Payer: BC Managed Care – PPO

## 2020-11-24 VITALS — BP 140/92 | HR 100 | Temp 97.7°F | Resp 16

## 2020-11-24 DIAGNOSIS — E8581 Light chain (AL) amyloidosis: Secondary | ICD-10-CM

## 2020-11-24 DIAGNOSIS — Z5112 Encounter for antineoplastic immunotherapy: Secondary | ICD-10-CM | POA: Diagnosis not present

## 2020-11-24 DIAGNOSIS — Z79899 Other long term (current) drug therapy: Secondary | ICD-10-CM

## 2020-11-24 LAB — CBC WITH DIFFERENTIAL (CANCER CENTER ONLY)
Abs Immature Granulocytes: 0.02 10*3/uL (ref 0.00–0.07)
Basophils Absolute: 0 10*3/uL (ref 0.0–0.1)
Basophils Relative: 0 %
Eosinophils Absolute: 0.1 10*3/uL (ref 0.0–0.5)
Eosinophils Relative: 2 %
HCT: 37.7 % — ABNORMAL LOW (ref 39.0–52.0)
Hemoglobin: 12.2 g/dL — ABNORMAL LOW (ref 13.0–17.0)
Immature Granulocytes: 0 %
Lymphocytes Relative: 29 %
Lymphs Abs: 1.6 10*3/uL (ref 0.7–4.0)
MCH: 30.5 pg (ref 26.0–34.0)
MCHC: 32.4 g/dL (ref 30.0–36.0)
MCV: 94.3 fL (ref 80.0–100.0)
Monocytes Absolute: 0.6 10*3/uL (ref 0.1–1.0)
Monocytes Relative: 11 %
Neutro Abs: 3.2 10*3/uL (ref 1.7–7.7)
Neutrophils Relative %: 58 %
Platelet Count: 156 10*3/uL (ref 150–400)
RBC: 4 MIL/uL — ABNORMAL LOW (ref 4.22–5.81)
RDW: 17 % — ABNORMAL HIGH (ref 11.5–15.5)
WBC Count: 5.4 10*3/uL (ref 4.0–10.5)
nRBC: 0 % (ref 0.0–0.2)

## 2020-11-24 LAB — LACTATE DEHYDROGENASE: LDH: 336 U/L — ABNORMAL HIGH (ref 98–192)

## 2020-11-24 LAB — CMP (CANCER CENTER ONLY)
ALT: 26 U/L (ref 0–44)
AST: 30 U/L (ref 15–41)
Albumin: 1.4 g/dL — ABNORMAL LOW (ref 3.5–5.0)
Alkaline Phosphatase: 84 U/L (ref 38–126)
Anion gap: 6 (ref 5–15)
BUN: 20 mg/dL (ref 6–20)
CO2: 33 mmol/L — ABNORMAL HIGH (ref 22–32)
Calcium: 7.8 mg/dL — ABNORMAL LOW (ref 8.9–10.3)
Chloride: 107 mmol/L (ref 98–111)
Creatinine: 1.09 mg/dL (ref 0.61–1.24)
GFR, Estimated: >60 mL/min (ref >60)
Glucose, Bld: 101 mg/dL — ABNORMAL HIGH (ref 70–99)
Potassium: 3.5 mmol/L (ref 3.5–5.1)
Sodium: 146 mmol/L — ABNORMAL HIGH (ref 135–145)
Total Bilirubin: 0.5 mg/dL (ref 0.3–1.2)
Total Protein: 4.1 g/dL — ABNORMAL LOW (ref 6.5–8.1)

## 2020-11-24 MED ORDER — DEXAMETHASONE 4 MG PO TABS
40.0000 mg | ORAL_TABLET | Freq: Once | ORAL | Status: AC
  Administered 2020-11-24: 40 mg via ORAL

## 2020-11-24 MED ORDER — BORTEZOMIB CHEMO SQ INJECTION 3.5 MG (2.5MG/ML)
1.3000 mg/m2 | Freq: Once | INTRAMUSCULAR | Status: AC
  Administered 2020-11-24: 3 mg via SUBCUTANEOUS
  Filled 2020-11-24: qty 1.2

## 2020-11-24 MED ORDER — DARATUMUMAB-HYALURONIDASE-FIHJ 1800-30000 MG-UT/15ML ~~LOC~~ SOLN
1800.0000 mg | Freq: Once | SUBCUTANEOUS | Status: AC
  Administered 2020-11-24: 1800 mg via SUBCUTANEOUS
  Filled 2020-11-24: qty 15

## 2020-11-24 MED ORDER — DIPHENHYDRAMINE HCL 25 MG PO CAPS
ORAL_CAPSULE | ORAL | Status: AC
  Filled 2020-11-24: qty 2

## 2020-11-24 MED ORDER — DEXAMETHASONE 4 MG PO TABS
ORAL_TABLET | ORAL | Status: AC
  Filled 2020-11-24: qty 10

## 2020-11-24 MED ORDER — DIPHENHYDRAMINE HCL 25 MG PO CAPS
50.0000 mg | ORAL_CAPSULE | Freq: Once | ORAL | Status: AC
  Administered 2020-11-24: 50 mg via ORAL

## 2020-11-24 MED ORDER — SODIUM CHLORIDE 0.9 % IV SOLN
INTRAVENOUS | Status: DC
  Filled 2020-11-24: qty 250

## 2020-11-24 MED ORDER — ACETAMINOPHEN 325 MG PO TABS
650.0000 mg | ORAL_TABLET | Freq: Once | ORAL | Status: AC
  Administered 2020-11-24: 650 mg via ORAL

## 2020-11-24 MED ORDER — SODIUM CHLORIDE 0.9 % IV SOLN
300.0000 mg/m2 | Freq: Once | INTRAVENOUS | Status: AC
  Administered 2020-11-24: 680 mg via INTRAVENOUS
  Filled 2020-11-24: qty 34

## 2020-11-24 NOTE — Patient Instructions (Signed)
[facility] Discharge Instructions for Patients Receiving Chemotherapy  Today you received the following chemotherapy agents: cytoxan, darzalex, and velcade.  To help prevent nausea and vomiting after your treatment, we encourage you to take your nausea medication as directed.   If you develop nausea and vomiting that is not controlled by your nausea medication, call the clinic.   BELOW ARE SYMPTOMS THAT SHOULD BE REPORTED IMMEDIATELY:  *FEVER GREATER THAN 100.5 F  *CHILLS WITH OR WITHOUT FEVER  NAUSEA AND VOMITING THAT IS NOT CONTROLLED WITH YOUR NAUSEA MEDICATION  *UNUSUAL SHORTNESS OF BREATH  *UNUSUAL BRUISING OR BLEEDING  TENDERNESS IN MOUTH AND THROAT WITH OR WITHOUT PRESENCE OF ULCERS  *URINARY PROBLEMS  *BOWEL PROBLEMS  UNUSUAL RASH Items with * indicate a potential emergency and should be followed up as soon as possible.  Feel free to call the clinic should you have any questions or concerns. The clinic phone number is [phone].  Please show the CHEMO ALERT CARD at check-in to the Emergency Department and triage nurse.   

## 2020-11-27 ENCOUNTER — Other Ambulatory Visit: Payer: Self-pay

## 2020-11-27 ENCOUNTER — Telehealth (INDEPENDENT_AMBULATORY_CARE_PROVIDER_SITE_OTHER): Payer: BC Managed Care – PPO | Admitting: Internal Medicine

## 2020-11-27 ENCOUNTER — Encounter: Payer: Self-pay | Admitting: Internal Medicine

## 2020-11-27 DIAGNOSIS — B181 Chronic viral hepatitis B without delta-agent: Secondary | ICD-10-CM

## 2020-11-27 DIAGNOSIS — Z79899 Other long term (current) drug therapy: Secondary | ICD-10-CM

## 2020-11-27 DIAGNOSIS — E854 Organ-limited amyloidosis: Secondary | ICD-10-CM | POA: Diagnosis not present

## 2020-11-27 MED ORDER — POTASSIUM CHLORIDE CRYS ER 20 MEQ PO TBCR: 40.0000 meq | EXTENDED_RELEASE_TABLET | Freq: Every day | ORAL | 6 refills | Status: DC

## 2020-11-27 MED ORDER — VEMLIDY 25 MG PO TABS: 25.0000 mg | ORAL_TABLET | Freq: Every day | ORAL | 5 refills | Status: DC

## 2020-11-27 NOTE — Progress Notes (Signed)
Cardiology Clinic Note   Patient Name: Samuel Butler Date of Encounter: 11/29/2020  Primary Care Provider:  Isaac Bliss, Rayford Halsted, MD Primary Cardiologist:  Quay Burow, MD  Patient Profile    Samuel Butler 60 year old male presents to the clinic today for follow-up evaluation of his chest pain, essential hypertension, and peripheral arterial disease.  Past Medical History    Past Medical History:  Diagnosis Date  . DDD (degenerative disc disease), lumbar    per his report  . Diabetes mellitus without complication ([place])   . Gout 04/16/2013  . Hyperlipidemia   . Hypertension   . IBD (inflammatory bowel disease) 04/05/2016  . Pulmonary embolism ([place])   . Tobacco use disorder 03/28/2015   Past Surgical History:  Procedure Laterality Date  . ABDOMINAL AORTOGRAM W/LOWER EXTREMITY Right 06/26/2020   Procedure: ABDOMINAL AORTOGRAM W/LOWER EXTREMITY;  Surgeon: Lorretta Harp, MD;  Location: [facility];  Service: Cardiovascular;  Laterality: Right;  . COLONOSCOPY    . PERIPHERAL VASCULAR INTERVENTION  06/26/2020   Procedure: PERIPHERAL VASCULAR INTERVENTION;  Surgeon: Lorretta Harp, MD;  Location: [place] [facility];  Service: Cardiovascular;;  Right SFA  . TONSILLECTOMY      Allergies  Allergies  Allergen Reactions  . Crestor [Rosuvastatin Calcium] Other (See Comments)    Joint aches    History of Present Illness    Samuel Butler has a PMH of essential hypertension, PAD, hepatitis B, type 2 diabetes, AKI, hypokalemia, and vitamin D deficiency.  Dr. Gwenlyn Found performed angiography on 06/26/2020 showing high-grade proximal and mid right SFA disease.  Revascularization was performed using right common femoral antegrade approach.  A 90% mid left SFA and 95-99% left popliteal lesion with one-vessel runoff was treated.  His symptoms of claudication were much improved after intervention.  He denied left leg claudication.  He reported right groin pain.  Ultrasound was  ordered and ruled out pseudoaneurysm.  He was found to have an inguinal hernia.  During his 09/26/20 visit he complained of occasional chest pain which was severe at times.  A coronary CTA was ordered and showed pulmonary embolism.  His coronary calcium score was 135 which was  60 percentile for age and sex matched control.  A likely CTO of the mid RCA with scattered nonobstructive disease in the left circulation.  He was contacted and asked to present to the emergency department for treatment of his PE.  Contacted the nurse triage line on 11/02/2020 with reports of chest discomfort while at the cancer center.  He was instructed to increase his Imdur to 60 mg daily at that time.  He presented to the clinic 11/15/19 for follow-up evaluation and stated he had not noticed much of a difference with increasing his Imdur to 60 mg daily.  He reported having chest discomfort at rest when he lays on his left side.  He described this as an aching type pain 3 out of 10.  He had noticed shortness of breath with increased physical activity and increased lower extremity swelling.  He denied chest pain with increased physical activity.  We reviewed his previous echocardiogram and he expressed understanding.  He reported that he was only been taking his torsemide once daily instead of twice daily.  I reviewed his medications and stressed compliance.  He reported compliance with his potassium.  I will ordered a BMP , order a fasting lipid panel, gave him a salty 6 diet sheet, gave the [place] support stocking sheet, and  planned follow-up in 2 weeks.  Who presents to the clinic today for follow-up evaluation states his breathing has started to improve.  He now states that he is able to complete almost a full shower before he needs to take a break.  His weight has remained stable.  However, he still has +2 bilateral lower extremity pitting edema.  He reports that he is now taking his torsemide as prescribed 20 mg twice  daily.  He states he has reduced his fluid consumption and is trying to eat low-sodium diet.  I will increase his torsemide to 40 mg twice daily x3 days and then resume his normal dosing.  We will increase his potassium to 80 mEq x 3 days and then resume his normal dosing.  I have asked him to further reduce his fluid consumption.  He reports he is drinking about 5 bottles of water daily.  He also notes that he has some pressured urination.  We will continue to monitor symptoms at this time.  I will give him a salty 6 diet sheet, a weight log, and have him follow-up in 2 weeks.  Today he denies chest pain, increased shortness of breath, increased lower extremity edema, fatigue, palpitations, melena, hematuria, hemoptysis, diaphoresis, weakness, presyncope, syncope, orthopnea, and PND.  Home Medications    Prior to Admission medications   Medication Sig Start Date End Date Taking? Authorizing Provider  acetaminophen (TYLENOL) 650 MG CR tablet Take 1,300 mg by mouth every 8 (eight) hours as needed for pain.     [provider]  albuterol (VENTOLIN HFA) 108 (90 Base) MCG/ACT inhaler Inhale 2 puffs into the lungs every 6 (six) hours as needed for wheezing or shortness of breath. 09/28/20   Orson Slick, MD  apixaban (ELIQUIS) 5 MG TABS tablet Take 1 tablet (5 mg total) by mouth 2 (two) times daily. 11/16/20   Isaac Bliss, Rayford Halsted, MD  atorvastatin (LIPITOR) 80 MG tablet Take 1 tablet (80 mg total) by mouth daily. 08/31/20   Isaac Bliss, Rayford Halsted, MD  clopidogrel (PLAVIX) 75 MG tablet Take 1 tablet (75 mg total) by mouth daily with breakfast. 06/27/20   Tommie Raymond, NP  dicyclomine (BENTYL) 10 MG capsule Take 1 capsule (10 mg total) by mouth 4 (four) times daily. Take 4 times daily before meals and at bedtime 10/24/20   Levin Erp, PA  ergocalciferol (VITAMIN D2) 1.25 MG (50000 UT) capsule Take 1 capsule (50,000 Units total) by mouth once a week. Patient taking  differently: Take 50,000 Units by mouth every Thursday. 08/31/20   Isaac Bliss, Rayford Halsted, MD  ezetimibe (ZETIA) 10 MG tablet Take 1 tablet (10 mg total) by mouth daily. 11/16/20 02/14/21  Deberah Pelton, NP  isosorbide mononitrate (IMDUR) 30 MG 24 hr tablet Take 1 tablet (30 mg total) by mouth daily. 11/14/20   Deberah Pelton, NP  losartan (COZAAR) 50 MG tablet Take 1 tablet (50 mg total) by mouth daily. 07/25/20   Lorretta Harp, MD  metFORMIN (GLUCOPHAGE) 500 MG tablet Take 1 tablet (500 mg total) by mouth daily with breakfast. Restart on 10/21/2020 10/20/20   Cristal Ford, DO  ondansetron (ZOFRAN) 8 MG tablet TAKE 1 TABLET BY MOUTH EVERY 8 HOURS AS NEEDED FOR NAUSEA OR VOMITING 10/27/20   Orson Slick, MD  pantoprazole (PROTONIX) 40 MG tablet Take 1 tablet (40 mg total) by mouth daily. Patient taking differently: Take 40 mg by mouth daily before breakfast. 07/14/20  Isaac Bliss, Rayford Halsted, MD  potassium chloride SA (KLOR-CON) 20 MEQ tablet Take 2 tablets (40 mEq total) by mouth 2 (two) times daily. 11/16/20   Deberah Pelton, NP  torsemide (DEMADEX) 20 MG tablet Take 20 mg by mouth 2 (two) times daily. 08/19/20   [provider]  VEMLIDY 25 MG TABS Take 25 mg by mouth daily.  09/25/20   [provider]    Family History    Family History  Problem Relation Age of Onset  . Hypertension Father   . Diabetes Father   . Stroke Father   . Colon cancer Brother    He indicated that the status of his father is unknown. He indicated that the status of his brother is unknown.  Social History    Social History   Socioeconomic History  . Marital status: Married    Spouse name: Not on file  . Number of children: Not on file  . Years of education: Not on file  . Highest education level: Not on file  Occupational History  . Not on file  Tobacco Use  . Smoking status: Former Smoker    Types: Cigarettes    Quit date: 10/23/2016    Years since quitting: 4.1  .  Smokeless tobacco: Never Used  . Tobacco comment: per patient 5 cigarettes a day   Vaping Use  . Vaping Use: Never used  Substance and Sexual Activity  . Alcohol use: Yes    Comment: occasional  . Drug use: No  . Sexual activity: Not on file  Other Topics Concern  . 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 Worried About Charity fundraiser in the Last Year: Sometimes true  . Ran Out of Food in the Last Year: Never true  Transportation Needs: No Transportation Needs  . Lack of Transportation (Medical): No  . Lack of Transportation (Non-Medical): No  Physical Activity: Not on file  Stress: Not on file  Social Connections: Not on file  Intimate Partner Violence: Not on file     Review of Systems    General:  No chills, fever, night sweats or weight changes.  Cardiovascular:  No chest pain, dyspnea on exertion, edema, orthopnea, palpitations, paroxysmal nocturnal dyspnea. Dermatological: No rash, lesions/masses Respiratory: No cough, dyspnea Urologic: No hematuria, dysuria Abdominal:   No nausea, vomiting, diarrhea, bright red blood per rectum, melena, or hematemesis Neurologic:  No visual changes, wkns, changes in mental status. All other systems reviewed and are otherwise negative except as noted above.  Physical Exam    VS:  BP 126/72 (BP Location: Left Arm, Patient Position: Sitting, Cuff Size: Normal)   Pulse 90   Ht 5\' 11"  (1.803 m)   Wt 232 lb 6.4 oz (105.4 kg)   SpO2 93%   BMI 32.41 kg/m  , BMI Body mass index is 32.41 kg/m. GEN: Well nourished, well developed, in no acute distress. HEENT: normal. Neck: Supple, no JVD, carotid bruits, or masses. Cardiac: RRR,  no murmurs, rubs, or gallops. No clubbing, cyanosis, +2 bilateral lower extremity pitting edema.  Radials/DP/PT 2+  and equal bilaterally.  Respiratory:  Respirations regular and unlabored, clear to auscultation bilaterally. GI: Soft, nontender, nondistended, BS + x 4. MS: no deformity or atrophy. Skin: warm and dry, no rash. Neuro:  Strength and sensation are intact. Psych: Normal affect.  Accessory Clinical Findings    Recent Labs: 05/25/2020: TSH 1.52 10/18/2020: B Natriuretic Peptide 638.2 11/24/2020: ALT 26; BUN 20; Creatinine 1.09; Hemoglobin 12.2; Platelet Count 156; Potassium 3.5; Sodium 146   Recent Lipid Panel    Component Value Date/Time   CHOL 303 (H) 11/16/2020 1104   TRIG 158.0 (H) 11/16/2020 1104   HDL 49.80 11/16/2020 1104   CHOLHDL 6 11/16/2020 1104   VLDL 31.6 11/16/2020 1104   LDLCALC 222 (H) 11/16/2020 1104   LDLCALC 278 (H) 05/25/2020 0849   LDLDIRECT 144.7 10/08/2013 0822    ECG personally reviewed by me today-none today.  normal sinus rhythm left axis deviation anterior infarct undetermined age T wave abnormality 84 bpm- No acute changes  Coronary CTA 10/18/2020 IMPRESSION: 1. Evidence of pulmonary embolism. Dr. Gwenlyn Found notified, patient has been sent to ER for evaluation. Enlarged PA size.  2. Severe CAD, CADRADS = 4. CT FFR will be reported separately. Likely CTO mid RCA with scattered nonobstructive disease in the left circulation.  3. Coronary calcium score of 135. This was 55 percentile for age and sex matched control.  4. Co dominant circulation, with individual ostia for the LAD and LCX arising from the [facility].  5. Moderate pericardial effusion  6. Moderately enlarged ascending aorta.  Assessment & Plan   1.  Coronary artery disease-no chest pain today.   No chest discomfort.  Denies recent arm neck and throat pain.   Chest discomfort when he lays on his left side is resolved.    Underwent coronary CTA 10/18/2020 which showed  nonobstructive CAD with a CTO of his mid RCA.  Moderate pericardial effusion.  A pulmonary embolus was identified.  Patient was contacted and instructed to report to the emergency department. Continue atorvastatin, Plavix, Eliquis Continue Imdur 30 mg Heart healthy low-sodium diet-salty 6 given Increase physical activity as tolerated  Lower extremity edema- continues with +2 pitting bilateral lower extremity edema.  Reports  taking torsemide twice daily.   Increase torsemide to 40 mg twice daily x3 days then resume 20 mg twice daily dosing Increase potassium to 80 mill equivalents daily for 3 days and then resume 40 mill equivalents Continue fluid restriction 48-60 fluid ounces daily Heart healthy low-sodium diet-salty 6 diet given Lower extremity support stockings Repeat BMP in 1 week  Peripheral vascular disease-denies claudication.  Continues to increase physical activity. Continue atorvastatin, Plavix Heart healthy low-sodium diet-salty 6 given Increase physical activity as tolerated  Essential hypertension-BP today  126/72.  Well-controlled at home Continue losartan, Imdur, torsemide, potassium Heart healthy low-sodium diet-salty 6 given Increase physical activity as tolerated  Hyperlipidemia-11/16/2020: Cholesterol 303; HDL 49.80; LDL Cholesterol 222; Triglycerides 158.0; VLDL 31.6 Continue atorvastatin, ezetimibe Heart healthy low-sodium high-fiber diet Refer to lipid clinic   Disposition: Follow-up with Dr. Gwenlyn Found or me in 2 weeks.   [name]. Zaire Vanbuskirk NP-C    11/29/2020, 9:42 AM [place] [address] Office [phone] Fax [phone]  Notice: This dictation was prepared with Dragon dictation along with smaller phrase technology. Any transcriptional errors that result from this process are unintentional and may not be corrected upon review.  I spent 15 minutes examining this patient, reviewing medications, and using  patient centered shared decision  making involving her cardiac care.  Prior to her visit I spent greater than 20 minutes reviewing her past medical history,  medications, and prior cardiac tests.

## 2020-11-27 NOTE — Assessment & Plan Note (Signed)
Patient currently on TAF for treatment of chronic HBV.  DNA PCR of 122,000 in October prior to starting therapy.  Fibrosis score F0-F1, normal LFTs, normal platelets.  US/Elastography with cirrhotic appearance but kPA was less than 5.  No hepatic mass seen.    PLAN: . Continue TAF . Check HBV DNA PCR . Check Hep B surface Ag, eAg, eAb . Check Hep A immunity labs.  Vaccinate if needed . Plan repeat US in April (every 6 months) . RTC 3 months

## 2020-11-27 NOTE — Assessment & Plan Note (Signed)
He is followed by Dr Lorenso Courier of hematology/oncology and currently on chemotherapy.  PLAN: . Continued heme/onc follow up.  Continuing TAF for treatment of hepatitis B

## 2020-11-27 NOTE — Progress Notes (Signed)
[facility] for Infectious Disease  CHIEF COMPLAINT:    Follow up for chronic hepatitis B.  Last seen by me on 08/22/20  SUBJECTIVE:    Samuel Butler is a 60 y.o. male with PMHx as below who presents to the clinic for follow up of chronic hepatitis B.   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  He underwent BM biopsy on 09/01/20 that showed plasma cell neoplasm with focal amyloid deposits.  Started on chemotherapy in November 2021.  I started him on Vemlidy in October due to need for HBV treatment in the setting of chemotherapy and his HBV DNA PCR was 122,000 copies, Fibrosis score F0-F1.   He has been taking this without issues.  Underwent US with elastography Oct 2021 showing cirrhotic appearing liver with nodular margins and calcified granuloma within the liver and spleen.  kPA was 3.3 (less than 5 has high probability for being normal).  No discrete mass seen. LFTs on 11/24/20 were normal.  Platelets normal.  PT/INR normal in December.   Recently seen in the GI clinic for generalized abdominal pain, diarrhea.  Plan is for EGD/colonoscopy when safe to do, c/w PPI and he was started on dicyclomine.  A barium swallow showed likely mild reflux stricture. GI Pathogen panel showed EPEC treated with cipro x 3 days.   Today, reports still taking Vemlidy.  Does not note any specific side effects although does mention that he is on so many medications that he is not sure which one would be the culprit if he were to be having issues.  Discussed that his labs are reassuring.  He does endorse leg swelling that continues.   Please see A&P for the details of today's visit and status of the patient's medical problems.   Patient's Medications  New Prescriptions   No medications on file  Previous Medications   ACETAMINOPHEN (TYLENOL) 650 MG CR TABLET    Take 1,300 mg by mouth every 8 (eight) hours as needed for pain.    ALBUTEROL (VENTOLIN HFA)  108 (90 BASE) MCG/ACT INHALER    Inhale 2 puffs into the lungs every 6 (six) hours as needed for wheezing or shortness of breath.   APIXABAN (ELIQUIS) 5 MG TABS TABLET    Take 1 tablet (5 mg total) by mouth 2 (two) times daily.   ATORVASTATIN (LIPITOR) 80 MG TABLET    Take 1 tablet (80 mg total) by mouth daily.   CLOPIDOGREL (PLAVIX) 75 MG TABLET    Take 1 tablet (75 mg total) by mouth daily with breakfast.   DICYCLOMINE (BENTYL) 10 MG CAPSULE    Take 1 capsule (10 mg total) by mouth 4 (four) times daily. Take 4 times daily before meals and at bedtime   ERGOCALCIFEROL (VITAMIN D2) 1.25 MG (50000 UT) CAPSULE    Take 1 capsule (50,000 Units total) by mouth once a week.   EZETIMIBE (ZETIA) 10 MG TABLET    Take 1 tablet (10 mg total) by mouth daily.   ISOSORBIDE MONONITRATE (IMDUR) 30 MG 24 HR TABLET    Take 1 tablet (30 mg total) by mouth daily.   LOSARTAN (COZAAR) 50 MG TABLET    Take 1 tablet (50 mg total) by mouth daily.   METFORMIN (GLUCOPHAGE) 500 MG TABLET    Take 1 tablet (500 mg total) by mouth daily with breakfast. Restart on 10/21/2020   ONDANSETRON (ZOFRAN) 8 MG TABLET  TAKE 1 TABLET BY MOUTH EVERY 8 HOURS AS NEEDED FOR NAUSEA OR VOMITING   PANTOPRAZOLE (PROTONIX) 40 MG TABLET    Take 1 tablet (40 mg total) by mouth daily.   POTASSIUM CHLORIDE SA (KLOR-CON) 20 MEQ TABLET    Take 2 tablets (40 mEq total) by mouth 2 (two) times daily.   TORSEMIDE (DEMADEX) 20 MG TABLET    Take 20 mg by mouth 2 (two) times daily.  Modified Medications   Modified Medication Previous Medication   VEMLIDY 25 MG TABS VEMLIDY 25 MG TABS      Take 1 tablet (25 mg total) by mouth daily.    Take 25 mg by mouth daily.   Discontinued Medications   No medications on file     Past Medical History:  Diagnosis Date  . DDD (degenerative disc disease), lumbar    per his report  . Diabetes mellitus without complication ([place])   . Gout 04/16/2013  . Hyperlipidemia   . Hypertension   . IBD (inflammatory bowel disease)  04/05/2016  . Pulmonary embolism ([place])   . Tobacco use disorder 03/28/2015    Family History  Problem Relation Age of Onset  . Hypertension Father   . Diabetes Father   . Stroke Father   . Colon cancer Brother     Social History   Socioeconomic History  . Marital status: Married    Spouse name: Not on file  . Number of children: Not on file  . Years of education: Not on file  . Highest education level: Not on file  Occupational History  . Not on file  Tobacco Use  . Smoking status: Former Smoker    Types: Cigarettes    Quit date: 10/23/2016    Years since quitting: 4.0  . Smokeless tobacco: Never Used  . Tobacco comment: per patient 5 cigarettes a day   Vaping Use  . Vaping Use: Never used  Substance and Sexual Activity  . Alcohol use: Yes    Comment: occasional  . Drug use: No  . Sexual activity: Not on file  Other Topics Concern  . 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 Worried About Charity fundraiser in the Last Year: Sometimes true  . Ran Out of Food in the Last Year: Never true  Transportation Needs: No Transportation Needs  . Lack of Transportation (Medical): No  . Lack of Transportation (Non-Medical): No  Physical Activity: Not on file  Stress: Not on file  Social Connections: Not on file  Intimate Partner Violence: Not on file    Allergies  Allergen Reactions  . Crestor [Rosuvastatin Calcium] Other (See Comments)    Joint aches    Review of Systems  Constitutional: Negative.   Cardiovascular: Positive for leg swelling.  Gastrointestinal: Negative for abdominal pain.     OBJECTIVE:     Physical Exam Constitutional:      Appearance:  Normal appearance.     Comments: Patient seen via virtual visit.  Conversation somewhat limited due to echo from microphone.   Neurological:     General: No focal  deficit present.     Mental Status: He is alert and oriented to person, place, and time.  Psychiatric:        Mood and Affect: Mood normal.        Behavior: Behavior normal.      Labs and Microbiology: CBC Latest Ref Rng & Units 11/24/2020 11/17/2020 11/09/2020  WBC 4.0 - 10.5 K/uL 5.4 4.3 5.4  Hemoglobin 13.0 - 17.0 g/dL 12.2(L) 12.6(L) 12.7(L)  Hematocrit 39.0 - 52.0 % 37.7(L) 38.6(L) 38.0(L)  Platelets 150 - 400 K/uL 156 162 117(L)   CMP Latest Ref Rng & Units 11/24/2020 11/17/2020 11/16/2020  Glucose 70 - 99 mg/dL 101(H) 111(H) 105(H)  BUN 6 - 20 mg/dL 20 19 21   Creatinine 0.61 - 1.24 mg/dL 1.09 1.02 1.18  Sodium 135 - 145 mmol/L 146(H) 143 145  Potassium 3.5 - 5.1 mmol/L 3.5 2.8(L) 2.7(LL)  Chloride 98 - 111 mmol/L 107 102 103  CO2 22 - 32 mmol/L 33(H) 31 36(H)  Calcium 8.9 - 10.3 mg/dL 7.8(L) 7.7(L) 7.0(L)  Total Protein 6.5 - 8.1 g/dL 4.1(L) 4.4(L) -  Total Bilirubin 0.3 - 1.2 mg/dL 0.5 0.6 -  Alkaline Phos 38 - 126 U/L 84 84 -  AST 15 - 41 U/L 30 27 -  ALT 0 - 44 U/L 26 18 -      ASSESSMENT & PLAN:    Amyloidosis (HCC) He is followed by Dr Lorenso Courier of hematology/oncology and currently on chemotherapy.  PLAN: . Continued heme/onc follow up.  Continuing TAF for treatment of hepatitis B   Chronic viral hepatitis B without delta-agent ([place]) Patient currently on TAF for treatment of chronic HBV.  DNA PCR of 122,000 in October prior to starting therapy.  Fibrosis score F0-F1, normal LFTs, normal platelets.  US/Elastography with cirrhotic appearance but kPA was less than 5.  No hepatic mass seen.    PLAN: . Continue TAF . Check HBV DNA PCR . Check Hep B surface Ag, eAg, eAb . Check Hep A immunity labs.  Vaccinate if needed . Plan repeat US in April (every 6 months) . RTC 3 months    Orders Placed This  Encounter  Procedures  . Hepatitis B DNA, ultraquantitative, PCR  . Hepatitis B e antibody  . Hepatitis B e antigen  . Hepatitis B surface antigen  . Hepatitis A antibody, total     Video televisit: Pt location: home My location: home office Time today reviewing recent notes/labs/time with pt: 31 minutes  Raynelle Highland for Infectious Disease [facility] 11/27/2020, 10:42 AM

## 2020-11-29 ENCOUNTER — Ambulatory Visit (INDEPENDENT_AMBULATORY_CARE_PROVIDER_SITE_OTHER): Payer: BC Managed Care – PPO | Admitting: General Practice

## 2020-11-29 ENCOUNTER — Encounter: Payer: Self-pay | Admitting: General Practice

## 2020-11-29 ENCOUNTER — Other Ambulatory Visit: Payer: Self-pay

## 2020-11-29 VITALS — BP 126/72 | HR 90 | Ht 71.0 in | Wt 232.4 lb

## 2020-11-29 DIAGNOSIS — I10 Essential (primary) hypertension: Secondary | ICD-10-CM | POA: Diagnosis not present

## 2020-11-29 DIAGNOSIS — Z79899 Other long term (current) drug therapy: Secondary | ICD-10-CM

## 2020-11-29 DIAGNOSIS — I739 Peripheral vascular disease, unspecified: Secondary | ICD-10-CM

## 2020-11-29 DIAGNOSIS — I2510 Atherosclerotic heart disease of native coronary artery without angina pectoris: Secondary | ICD-10-CM | POA: Diagnosis not present

## 2020-11-29 DIAGNOSIS — R600 Localized edema: Secondary | ICD-10-CM | POA: Diagnosis not present

## 2020-11-29 DIAGNOSIS — E785 Hyperlipidemia, unspecified: Secondary | ICD-10-CM

## 2020-11-29 MED ORDER — TORSEMIDE 20 MG PO TABS: 20.0000 mg | ORAL_TABLET | Freq: Two times a day (BID) | ORAL | 6 refills | Status: AC

## 2020-11-29 MED ORDER — POTASSIUM CHLORIDE CRYS ER 20 MEQ PO TBCR: 40.0000 meq | EXTENDED_RELEASE_TABLET | Freq: Every day | ORAL | 6 refills | Status: DC

## 2020-11-29 NOTE — Patient Instructions (Signed)
Medication Instructions:  INCREASE TORSEMIDE 40MG  TWICE DAILY THEN BACK TO 20MG  TWICE DAILY  INCREASE POTASSIUM 80MG  DAILY THEN BACK TO 40MG  DAILY *If you need a refill on your cardiac medications before your next appointment, please call your pharmacy*  Lab Work:    BMET IN 1 WEEK    Special Instructions TAKE AND LOG YOUR WEIGHT DAILY-AFTER URINATING. BRING THIS LOG BACK WITH YOU TO YOUR FOLLOW UP VISIT IN 2 WEEKS  FLUID RESTRICTION ONLY 60oz DAILY  PLEASE READ AND FOLLOW SALTY 6-ATTACHED-1,800mg  daily  REFERRAL TO LIPID CLINIC WITH PHARMACIST  MAKE SURE TO WEAR YOUR COMPRESSION STOCKING EVERY DAY-OFF AT BADTIME  Follow-Up: Your next appointment:  2 week(s) In Person with Samuel Burow, MD OR IF UNAVAILABLE [place], FNP-C  At [facility], you and your health needs are our priority.  As part of our continuing mission to provide you with exceptional heart care, we have created designated Provider Care Teams.  These Care Teams include your primary Cardiologist (physician) and Advanced Practice Providers (APPs -  Physician Assistants and Nurse Practitioners) who all work together to provide you with the care you need, when you need it.            6 SALTY THINGS TO AVOID     1,800MG  DAILY

## 2020-12-01 ENCOUNTER — Inpatient Hospital Stay (HOSPITAL_BASED_OUTPATIENT_CLINIC_OR_DEPARTMENT_OTHER): Payer: BC Managed Care – PPO | Admitting: Hematology and Oncology

## 2020-12-01 ENCOUNTER — Inpatient Hospital Stay: Payer: BC Managed Care – PPO

## 2020-12-01 ENCOUNTER — Encounter: Payer: Self-pay | Admitting: Hematology and Oncology

## 2020-12-01 ENCOUNTER — Other Ambulatory Visit: Payer: BC Managed Care – PPO

## 2020-12-01 ENCOUNTER — Other Ambulatory Visit: Payer: Self-pay

## 2020-12-01 VITALS — BP 145/97 | HR 97 | Temp 97.8°F | Resp 18 | Ht 71.0 in | Wt 231.3 lb

## 2020-12-01 DIAGNOSIS — E8581 Light chain (AL) amyloidosis: Secondary | ICD-10-CM

## 2020-12-01 DIAGNOSIS — N049 Nephrotic syndrome with unspecified morphologic changes: Secondary | ICD-10-CM | POA: Diagnosis not present

## 2020-12-01 DIAGNOSIS — E854 Organ-limited amyloidosis: Secondary | ICD-10-CM

## 2020-12-01 DIAGNOSIS — Z5112 Encounter for antineoplastic immunotherapy: Secondary | ICD-10-CM | POA: Diagnosis not present

## 2020-12-01 LAB — CBC WITH DIFFERENTIAL (CANCER CENTER ONLY)
Abs Immature Granulocytes: 0.01 10*3/uL (ref 0.00–0.07)
Basophils Absolute: 0 10*3/uL (ref 0.0–0.1)
Basophils Relative: 0 %
Eosinophils Absolute: 0.1 10*3/uL (ref 0.0–0.5)
Eosinophils Relative: 1 %
HCT: 34.9 % — ABNORMAL LOW (ref 39.0–52.0)
Hemoglobin: 11.8 g/dL — ABNORMAL LOW (ref 13.0–17.0)
Immature Granulocytes: 0 %
Lymphocytes Relative: 28 %
Lymphs Abs: 1.4 10*3/uL (ref 0.7–4.0)
MCH: 31.6 pg (ref 26.0–34.0)
MCHC: 33.8 g/dL (ref 30.0–36.0)
MCV: 93.3 fL (ref 80.0–100.0)
Monocytes Absolute: 0.5 10*3/uL (ref 0.1–1.0)
Monocytes Relative: 11 %
Neutro Abs: 2.9 10*3/uL (ref 1.7–7.7)
Neutrophils Relative %: 60 %
Platelet Count: 131 10*3/uL — ABNORMAL LOW (ref 150–400)
RBC: 3.74 MIL/uL — ABNORMAL LOW (ref 4.22–5.81)
RDW: 16.7 % — ABNORMAL HIGH (ref 11.5–15.5)
WBC Count: 4.8 10*3/uL (ref 4.0–10.5)
nRBC: 0 % (ref 0.0–0.2)

## 2020-12-01 LAB — CMP (CANCER CENTER ONLY)
ALT: 26 U/L (ref 0–44)
AST: 23 U/L (ref 15–41)
Albumin: 1.4 g/dL — ABNORMAL LOW (ref 3.5–5.0)
Alkaline Phosphatase: 75 U/L (ref 38–126)
Anion gap: 7 (ref 5–15)
BUN: 19 mg/dL (ref 6–20)
CO2: 30 mmol/L (ref 22–32)
Calcium: 7.7 mg/dL — ABNORMAL LOW (ref 8.9–10.3)
Chloride: 107 mmol/L (ref 98–111)
Creatinine: 1.06 mg/dL (ref 0.61–1.24)
GFR, Estimated: >60 mL/min (ref >60)
Glucose, Bld: 93 mg/dL (ref 70–99)
Potassium: 3.7 mmol/L (ref 3.5–5.1)
Sodium: 144 mmol/L (ref 135–145)
Total Bilirubin: 0.5 mg/dL (ref 0.3–1.2)
Total Protein: 4.1 g/dL — ABNORMAL LOW (ref 6.5–8.1)

## 2020-12-01 LAB — LACTATE DEHYDROGENASE: LDH: 337 U/L — ABNORMAL HIGH (ref 98–192)

## 2020-12-01 MED ORDER — DEXAMETHASONE 4 MG PO TABS
40.0000 mg | ORAL_TABLET | Freq: Once | ORAL | Status: AC
  Administered 2020-12-01: 40 mg via ORAL

## 2020-12-01 MED ORDER — DIPHENHYDRAMINE HCL 25 MG PO CAPS
50.0000 mg | ORAL_CAPSULE | Freq: Once | ORAL | Status: AC
  Administered 2020-12-01: 50 mg via ORAL

## 2020-12-01 MED ORDER — BORTEZOMIB CHEMO SQ INJECTION 3.5 MG (2.5MG/ML)
1.3000 mg/m2 | Freq: Once | INTRAMUSCULAR | Status: AC
Start: 2020-12-01 — End: 2020-12-01
  Administered 2020-12-01: 3 mg via SUBCUTANEOUS
  Filled 2020-12-01: qty 1.2

## 2020-12-01 MED ORDER — DEXAMETHASONE 4 MG PO TABS
ORAL_TABLET | ORAL | Status: AC
  Filled 2020-12-01: qty 10

## 2020-12-01 MED ORDER — ACETAMINOPHEN 325 MG PO TABS
ORAL_TABLET | ORAL | Status: AC
  Filled 2020-12-01: qty 2

## 2020-12-01 MED ORDER — SODIUM CHLORIDE 0.9 % IV SOLN
300.0000 mg/m2 | Freq: Once | INTRAVENOUS | Status: AC
  Administered 2020-12-01: 680 mg via INTRAVENOUS
  Filled 2020-12-01: qty 34

## 2020-12-01 MED ORDER — DIPHENHYDRAMINE HCL 25 MG PO CAPS
ORAL_CAPSULE | ORAL | Status: AC
  Filled 2020-12-01: qty 2

## 2020-12-01 MED ORDER — ACETAMINOPHEN 325 MG PO TABS
650.0000 mg | ORAL_TABLET | Freq: Once | ORAL | Status: AC
  Administered 2020-12-01: 650 mg via ORAL

## 2020-12-01 MED ORDER — DARATUMUMAB-HYALURONIDASE-FIHJ 1800-30000 MG-UT/15ML ~~LOC~~ SOLN
1800.0000 mg | Freq: Once | SUBCUTANEOUS | Status: AC
Start: 2020-12-01 — End: 2020-12-01
  Administered 2020-12-01: 1800 mg via SUBCUTANEOUS
  Filled 2020-12-01: qty 15

## 2020-12-01 NOTE — Patient Instructions (Signed)
[facility] Discharge Instructions for Patients Receiving Chemotherapy  Today you received the following chemotherapy agents: cytoxan, darzalex, and velcade.  To help prevent nausea and vomiting after your treatment, we encourage you to take your nausea medication as directed.   If you develop nausea and vomiting that is not controlled by your nausea medication, call the clinic.   BELOW ARE SYMPTOMS THAT SHOULD BE REPORTED IMMEDIATELY:  *FEVER GREATER THAN 100.5 F  *CHILLS WITH OR WITHOUT FEVER  NAUSEA AND VOMITING THAT IS NOT CONTROLLED WITH YOUR NAUSEA MEDICATION  *UNUSUAL SHORTNESS OF BREATH  *UNUSUAL BRUISING OR BLEEDING  TENDERNESS IN MOUTH AND THROAT WITH OR WITHOUT PRESENCE OF ULCERS  *URINARY PROBLEMS  *BOWEL PROBLEMS  UNUSUAL RASH Items with * indicate a potential emergency and should be followed up as soon as possible.  Feel free to call the clinic should you have any questions or concerns. The clinic phone number is [phone].  Please show the [facility] at check-in to the Emergency Department and triage nurse.

## 2020-12-01 NOTE — Progress Notes (Signed)
Pt discharged in no apparent distress. Pt left ambulatory without assistance. Pt aware of discharge instructions and verbalized understanding and had no further questions.  

## 2020-12-01 NOTE — Progress Notes (Signed)
[facility] [phone]   [phone]  PROGRESS NOTE  Patient Care Team: Isaac Bliss, Rayford Halsted, MD as PCP - General (Internal Medicine) Lorretta Harp, MD as PCP - Cardiology (Cardiology)  Hematological/Oncological History # AL Amyloidosis of the Kidney 1) 08/03/2020: Free kappa 19.5, Lambda 205.7, Kappa/Lambda ratio: 0.09. M protein undetectable in serum.  2) 08/07/2020: kidney biopsy performed, results consistent with lambda light chain AL amyloidosis 3) 08/23/2020: establish care with Dr. Lorenso Courier  4) 09/01/2020: bone marrow biopsy shows plasma cell neoplasm with focal amyloid deposits 5) 09/29/2020: start of Cycle 1 Day 1 of Dara-CyBorD 6) 11/02/2020: Cycle 2 Day 1 of Dara-CyBorD 7) 12/01/2020: Cycle 3 Day 1 of Dara-CyBorD  Interval History:  Samuel Butler 60 y.o. male with medical history significant for AL amyloidosis of the kidney presents for a follow up visit. The patient's last visit was on 11/17/2020. In the interim since his last visit he has continued his treatment of Dara-CyBorD.   On exam today Mr. Avetisyan notes no major changes in his health since his last visit 2 weeks ago.  He reports that he did have an increase in his torsemide therapy due to increased fluid retention.  He is up 5 pounds in weight today.  He notes that his chemotherapy is still going well.  He does have occasional diarrhea and reports it happens about once to twice per week.  He notes it typically happens in 1 episode of very loose stool.  He notes that he does have some occasional stomach discomfort and soreness as well.  He notes that it is modestly affecting his life at this point time.  On further discussion he denies having issues with fevers, chills, sweats, nausea, vomiting or numbness.  He does have occasional discomfort in his feet when they become cold.  He is having some fatigue and tiredness but overall has been well.  A full 10 point ROS is listed  below.  MEDICAL HISTORY:  Past Medical History:  Diagnosis Date  . DDD (degenerative disc disease), lumbar    per his report  . Diabetes mellitus without complication ([place])   . Gout 04/16/2013  . Hyperlipidemia   . Hypertension   . IBD (inflammatory bowel disease) 04/05/2016  . Pulmonary embolism ([place])   . Tobacco use disorder 03/28/2015    SURGICAL HISTORY: Past Surgical History:  Procedure Laterality Date  . ABDOMINAL AORTOGRAM W/LOWER EXTREMITY Right 06/26/2020   Procedure: ABDOMINAL AORTOGRAM W/LOWER EXTREMITY;  Surgeon: Lorretta Harp, MD;  Location: [facility];  Service: Cardiovascular;  Laterality: Right;  . COLONOSCOPY    . PERIPHERAL VASCULAR INTERVENTION  06/26/2020   Procedure: PERIPHERAL VASCULAR INTERVENTION;  Surgeon: Lorretta Harp, MD;  Location: [facility];  Service: Cardiovascular;;  Right SFA  . TONSILLECTOMY      SOCIAL HISTORY: Social History   Socioeconomic History  . Marital status: Married    Spouse name: Not on file  . Number of children: Not on file  . Years of education: Not on file  . Highest education level: Not on file  Occupational History  . Not on file  Tobacco Use  . Smoking status: Former Smoker    Types: Cigarettes    Quit date: 10/23/2016    Years since quitting: 4.1  . Smokeless tobacco: Never Used  . Tobacco comment: per patient 5 cigarettes a day   Vaping Use  . Vaping Use: Never used  Substance and Sexual Activity  .  Alcohol use: Yes    Comment: occasional  . Drug use: No  . Sexual activity: Not on file  Other Topics Concern  . 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 Worried About Sales executive in the Last Year: Sometimes true  . Ran Out of Food in the Last Year: Never true  Transportation Needs: No Transportation Needs  . Lack of Transportation (Medical): No  . Lack of Transportation (Non-Medical): No  Physical Activity: Not on file  Stress: Not on file  Social Connections: Not on file  Intimate Partner Violence: Not on file    FAMILY HISTORY: Family History  Problem Relation Age of Onset  . Hypertension Father   . Diabetes Father   . Stroke Father   . Colon cancer Brother     ALLERGIES:  is allergic to crestor [rosuvastatin calcium].  MEDICATIONS:  Current Outpatient Medications  Medication Sig Dispense Refill  . acetaminophen (TYLENOL) 650 MG CR tablet Take 1,300 mg by mouth every 8 (eight) hours as needed for pain.     Marland Kitchen albuterol (VENTOLIN HFA) 108 (90 Base) MCG/ACT inhaler Inhale 2 puffs into the lungs every 6 (six) hours as needed for wheezing or shortness of breath. 8 g 0  . apixaban (ELIQUIS) 5 MG TABS tablet Take 1 tablet (5 mg total) by mouth 2 (two) times daily. 180 tablet 1  . atorvastatin (LIPITOR) 80 MG tablet Take 1 tablet (80 mg total) by mouth daily. 90 tablet 2  . clopidogrel (PLAVIX) 75 MG tablet Take 1 tablet (75 mg total) by mouth daily with breakfast. 90 tablet 3  . dicyclomine (BENTYL) 10 MG capsule Take 1 capsule (10 mg total) by mouth 4 (four) times daily. Take 4 times daily before meals and at bedtime 90 capsule 0  . ergocalciferol (VITAMIN D2) 1.25 MG (50000 UT) capsule Take 1 capsule (50,000 Units total) by mouth once a week. (Patient taking differently: Take 50,000 Units by mouth every Thursday.) 12 capsule 0  . ezetimibe (ZETIA) 10 MG tablet Take 1 tablet (10 mg total) by mouth daily. 90 tablet 3  . isosorbide mononitrate (IMDUR) 30 MG 24 hr tablet Take 1 tablet (30 mg total) by mouth daily.    Marland Kitchen losartan (COZAAR) 50 MG tablet Take 1 tablet (50 mg total) by mouth daily. 90 tablet 3  . metFORMIN (GLUCOPHAGE) 500 MG tablet Take 1 tablet  (500 mg total) by mouth daily with breakfast. Restart on 10/21/2020    . ondansetron (ZOFRAN) 8 MG tablet TAKE 1 TABLET BY MOUTH EVERY 8 HOURS AS NEEDED FOR NAUSEA OR VOMITING 30 tablet 0  . pantoprazole (PROTONIX) 40 MG tablet Take 1 tablet (40 mg total) by mouth daily. (Patient taking differently: Take 40 mg by mouth daily before breakfast.) 90 tablet 1  . potassium chloride SA (KLOR-CON) 20 MEQ tablet Take 2 tablets (40 mEq total) by mouth daily. 80MG x3 days then back to 40 mg/day 33 tablet 6  . torsemide (DEMADEX) 20 MG tablet Take 1 tablet (20 mg total) by mouth 2 (two) times daily. 34m BID x 3  days then back to 4m BID 33 tablet 6  . VEMLIDY 25 MG TABS Take 1 tablet (25 mg total) by mouth daily. 30 tablet 5   No current facility-administered medications for this visit.    REVIEW OF SYSTEMS:   Constitutional: ( - ) fevers, ( - )  chills , ( - ) night sweats Eyes: ( - ) blurriness of vision, ( - ) double vision, ( - ) watery eyes Ears, nose, mouth, throat, and face: ( - ) mucositis, ( - ) sore throat Respiratory: ( - ) cough, ( - ) dyspnea, ( - ) wheezes Cardiovascular: ( - ) palpitation, ( - ) chest discomfort, ( - ) lower extremity swelling Gastrointestinal:  ( - ) nausea, ( - ) heartburn, ( - ) change in bowel habits Skin: ( - ) abnormal skin rashes Lymphatics: ( - ) new lymphadenopathy, ( - ) easy bruising Neurological: ( - ) numbness, ( - ) tingling, ( - ) new weaknesses Behavioral/Psych: ( - ) mood change, ( - ) new changes  All other systems were reviewed with the patient and are negative.  PHYSICAL EXAMINATION: ECOG PERFORMANCE STATUS: 1 - Symptomatic but completely ambulatory  Vitals:   12/01/20 0800  BP: (!) 145/97  Pulse: 97  Resp: 18  Temp: 97.8 F (36.6 C)  SpO2: 97%   Filed Weights   12/01/20 0800  Weight: 231 lb 4.8 oz (104.9 kg)    GENERAL: well appearing middle aged [place] male alert, no distress and comfortable SKIN: skin color, texture,  turgor are normal, no rashes or significant lesions EYES: conjunctiva are pink and non-injected, sclera clear LUNGS: clear to auscultation and percussion with normal breathing effort HEART: regular rate & rhythm and no murmurs and +2 lower extremity edema bilaterally Musculoskeletal: no cyanosis of digits and no clubbing  PSYCH: alert & oriented x 3, fluent speech NEURO: no focal motor/sensory deficits  LABORATORY DATA:  I have reviewed the data as listed CBC Latest Ref Rng & Units 12/01/2020 11/24/2020 11/17/2020  WBC 4.0 - 10.5 K/uL 4.8 5.4 4.3  Hemoglobin 13.0 - 17.0 g/dL 11.8(L) 12.2(L) 12.6(L)  Hematocrit 39.0 - 52.0 % 34.9(L) 37.7(L) 38.6(L)  Platelets 150 - 400 K/uL 131(L) 156 162    CMP Latest Ref Rng & Units 11/24/2020 11/17/2020 11/16/2020  Glucose 70 - 99 mg/dL 101(H) 111(H) 105(H)  BUN 6 - 20 mg/dL _0 Creatinine 0.61 - 1.24 mg/dL 1.09 1.02 1.18  Sodium 135 - 145 mmol/L 146(H) 143 145  Potassium 3.5 - 5.1 mmol/L 3.5 2.8(L) 2.7(LL)  Chloride 98 - 111 mmol/L 107 102 103  CO2 22 - 32 mmol/L 33(H) 31 36(H)  Calcium 8.9 - 10.3 mg/dL 7.8(L) 7.7(L) 7.0(L)  Total Protein 6.5 - 8.1 g/dL 4.1(L) 4.4(L) -  Total Bilirubin 0.3 - 1.2 mg/dL 0.5 0.6 -  Alkaline Phos 38 - 126 U/L 84 84 -  AST 15 - 41 U/L 30 27 -  ALT 0 - 44 U/L 26 18 -    Lab Results  Component Value Date   MPROTEIN 0.1 (H) 11/17/2020   MPROTEIN 0.1 (H) 10/13/2020   MPROTEIN Not Observed 08/23/2020   Lab Results  Component Value Date   KPAFRELGTCHN 12.1 11/17/2020   KPAFRELGTCHN 10.0 10/13/2020   KPAFRELGTCHN 18.5 08/23/2020   LAMBDASER 27.4 (H) 11/17/2020   LAMBDASER 56.1 (H) 10/13/2020   LAMBDASER 199.1 (H) 08/23/2020   KAPLAMBRATIO 0.76 (L) 11/21/2020   KAPLAMBRATIO 0.44 11/17/2020   KAPLAMBRATIO 0.36 (  L) 10/16/2020     RADIOGRAPHIC STUDIES: DG ESOPHAGUS W DOUBLE CM (HD)  Result Date: 11/07/2020 CLINICAL DATA:  Dysphagia.  Abdominal pain. EXAM: ESOPHOGRAM / BARIUM SWALLOW / BARIUM TABLET STUDY  TECHNIQUE: Combined double contrast and single contrast examination performed using effervescent crystals, thick barium liquid, and thin barium liquid. The patient was observed with fluoroscopy swallowing a 13 mm barium sulphate tablet. FLUOROSCOPY TIME:  Fluoroscopy Time:  2 minutes and 6 seconds Radiation Exposure Index (if provided by the fluoroscopic device): 40.4 mGy Number of Acquired Spot Images: 0 COMPARISON:  None. FINDINGS: Initial barium swallows demonstrate normal pharyngeal motion with swallowing. No laryngeal penetration or aspiration. No upper esophageal webs, strictures or diverticuli. Normal esophageal motility. Smooth area of distal esophageal narrowing just above the esophageal vestibule. Findings suspicious for a mild reflux stricture. The 13 mm barium pill did pass through this area without difficulty. No hiatal hernia was identified. Minimal GE reflux noted with water swallowing. IMPRESSION: 1. Smooth area of distal esophageal narrowing just above the esophageal vestibule most likely a mild reflux stricture. 2. No esophageal mass. 3. Normal esophageal motility. 4. No hiatal hernia was demonstrated. Minimal GE reflux with water swallowing. Electronically Signed   By: Marijo Sanes M.D.   On: 11/07/2020 10:51    ASSESSMENT & PLAN Samuel Butler 60 y.o. male with medical history significant for AL amyloidosis of the kidney presents for a follow up visit.  After review the labs, the records, discussion with the patient the findings are most consistent with AL amyloidosis affecting the kidney.  As such I would recommended we start treatment with Dara CyBorD.  After reviewing the biopsy results his findings are most consistent with AL amyloidosis. As such the treatment of choice would be to target his plasma cell population with a triplet or quadruplet therapy. Therapy of choice in this case would consist of daratumumab, Velcade, cyclophosphamide, and dexamethasone. Given the patient's good  functional status we will start with full dose Dara-CyBorD. I previously discussed the side effects of this chemotherapy with the patient including neuropathy, elevated blood pressure, drop in blood counts, hypersensitivity reaction, chest tightness, increased infection risk, and fatigue. The patient and family voiced their understanding of these findings and are agreeable to moving forward with quadruple therapy with the goal of being a bridge to bone marrow transplant.   The regimen of choice is daratumumab, bortezomib, cyclophosphamide and dexamethasone per the ANDROMEDA Study ( Blood. 2020 Jul 2;136(1):71-80). Treatment consists of: Cyclophosphamide 300 mg/m2 intravenously and bortezomib 1.3 mg/m2 subcutaneously were given on days 1, 8, 15, and 22 of each 28 day cycle for up to 6 cycles. Dexamethasone 40 mg (starting dose) was given orally or intravenously weekly for each cycle for up to 6 cycles. DARA [place] was administered in a single, premixed vial and given by manual [place] injection over the course of 3 to 5 minutes weekly in cycles 1 to 2, every 2 weeks in cycles 3 to 6, and every 4 weeks thereafter as monotherapy for a maximum of 2 years. We would consider this continued dosing regimen if patient declined or was determined not to be a candidate for BMT.   On exam today Mr. Cullars is been tolerating treatment well overall.   His hemoglobin is stable and otherwise his labs are acceptable for proceeding with treatment today.  #AL Amyloidosis Affecting the Kidney --bone marrow biopsy and kidney biopsy helped to confirm the diagnosis of AL amyloidosis. --currently being treated with  Dara-CyBorD (per  the Andromeda Trial), started Cycle 1 Day 1 on 09/29/2020.  --today is Cycle 3 Day 1 of treatment.  --will monitoring monthly SPEP, UPEP, and SFLC while on treatment. --will have patient return for weekly treatment with q 2 week clinic visits initially.   #Injection Site Rash, improved --appears benign on  exam today  --completed antibiotic therapy  #Supportive Care --chemotherapy education performed --zofran 10m q8H PRN and compazine 133mPO q6H for nausea --acyclovir 4004mO BID for VCZ prophylaxis --albuterol HFA inhaler for daratumumab treatment -- EMLA cream for port not required, patient declines port at this time, but will reconsider once treatment starts -- no pain medication required at this time.   No orders of the defined types were placed in this encounter.   All questions were answered. The patient knows to call the clinic with any problems, questions or concerns.  A total of more than 30 minutes were spent on this encounter and over half of that time was spent on counseling and coordination of care as outlined above.   JohLedell PeoplesD Department of Hematology/Oncology [facility]: [phone]: [phone]: [email or web address]  12/01/2020 8:17 AM   Literature Support:  V, Clarita Craneasey SY, Weiss BM, VerRush Landmarkerlini G, Comenzo RL. Daratumumab plus CyBorD for patients with newly diagnosed AL amyloidosis: safety run-in results of ANDROMEDA. Blood. 2020 Jul 2;136(1):71-80.  --Daratumumab-CyBorD was well tolerated, with no new safety concerns versus the intravenous formulation, and demonstrated robust hematologic and organ responses.

## 2020-12-05 ENCOUNTER — Other Ambulatory Visit: Payer: Self-pay | Admitting: Internal Medicine

## 2020-12-05 ENCOUNTER — Encounter: Payer: Self-pay | Admitting: Internal Medicine

## 2020-12-05 ENCOUNTER — Telehealth: Payer: Self-pay | Admitting: Hematology and Oncology

## 2020-12-05 DIAGNOSIS — E119 Type 2 diabetes mellitus without complications: Secondary | ICD-10-CM

## 2020-12-05 MED ORDER — ATORVASTATIN CALCIUM 80 MG PO TABS: 80.0000 mg | ORAL_TABLET | Freq: Every day | ORAL | 2 refills | Status: DC

## 2020-12-05 NOTE — Telephone Encounter (Signed)
Scheduled per 12/1 los. Pt will receive an updated appt calendar per next visit appt notes  

## 2020-12-07 ENCOUNTER — Encounter: Payer: Self-pay | Admitting: Hematology and Oncology

## 2020-12-08 ENCOUNTER — Other Ambulatory Visit: Payer: Self-pay | Admitting: *Deleted

## 2020-12-08 ENCOUNTER — Inpatient Hospital Stay: Payer: BC Managed Care – PPO

## 2020-12-08 ENCOUNTER — Other Ambulatory Visit: Payer: BC Managed Care – PPO

## 2020-12-08 ENCOUNTER — Other Ambulatory Visit: Payer: Self-pay

## 2020-12-08 VITALS — BP 124/90 | HR 99 | Temp 98.2°F | Resp 18

## 2020-12-08 DIAGNOSIS — E8581 Light chain (AL) amyloidosis: Secondary | ICD-10-CM

## 2020-12-08 DIAGNOSIS — Z5112 Encounter for antineoplastic immunotherapy: Secondary | ICD-10-CM | POA: Diagnosis not present

## 2020-12-08 LAB — CBC WITH DIFFERENTIAL (CANCER CENTER ONLY)
Abs Immature Granulocytes: 0.02 10*3/uL (ref 0.00–0.07)
Basophils Absolute: 0 10*3/uL (ref 0.0–0.1)
Basophils Relative: 0 %
Eosinophils Absolute: 0.1 10*3/uL (ref 0.0–0.5)
Eosinophils Relative: 2 %
HCT: 34.6 % — ABNORMAL LOW (ref 39.0–52.0)
Hemoglobin: 11.3 g/dL — ABNORMAL LOW (ref 13.0–17.0)
Immature Granulocytes: 0 %
Lymphocytes Relative: 21 %
Lymphs Abs: 1.3 10*3/uL (ref 0.7–4.0)
MCH: 31 pg (ref 26.0–34.0)
MCHC: 32.7 g/dL (ref 30.0–36.0)
MCV: 94.8 fL (ref 80.0–100.0)
Monocytes Absolute: 0.6 10*3/uL (ref 0.1–1.0)
Monocytes Relative: 10 %
Neutro Abs: 4.3 10*3/uL (ref 1.7–7.7)
Neutrophils Relative %: 67 %
Platelet Count: 125 10*3/uL — ABNORMAL LOW (ref 150–400)
RBC: 3.65 MIL/uL — ABNORMAL LOW (ref 4.22–5.81)
RDW: 16.9 % — ABNORMAL HIGH (ref 11.5–15.5)
WBC Count: 6.3 10*3/uL (ref 4.0–10.5)
nRBC: 0 % (ref 0.0–0.2)

## 2020-12-08 LAB — CMP (CANCER CENTER ONLY)
ALT: 21 U/L (ref 0–44)
AST: 20 U/L (ref 15–41)
Albumin: 1.3 g/dL — ABNORMAL LOW (ref 3.5–5.0)
Alkaline Phosphatase: 78 U/L (ref 38–126)
Anion gap: 6 (ref 5–15)
BUN: 16 mg/dL (ref 6–20)
CO2: 30 mmol/L (ref 22–32)
Calcium: 7.6 mg/dL — ABNORMAL LOW (ref 8.9–10.3)
Chloride: 107 mmol/L (ref 98–111)
Creatinine: 0.91 mg/dL (ref 0.61–1.24)
GFR, Estimated: >60 mL/min (ref >60)
Glucose, Bld: 88 mg/dL (ref 70–99)
Potassium: 3.5 mmol/L (ref 3.5–5.1)
Sodium: 143 mmol/L (ref 135–145)
Total Bilirubin: 0.5 mg/dL (ref 0.3–1.2)
Total Protein: 4 g/dL — ABNORMAL LOW (ref 6.5–8.1)

## 2020-12-08 LAB — LACTATE DEHYDROGENASE: LDH: 258 U/L — ABNORMAL HIGH (ref 98–192)

## 2020-12-08 MED ORDER — DEXAMETHASONE 4 MG PO TABS
40.0000 mg | ORAL_TABLET | Freq: Once | ORAL | Status: AC
  Administered 2020-12-08: 40 mg via ORAL

## 2020-12-08 MED ORDER — SODIUM CHLORIDE 0.9 % IV SOLN
Freq: Once | INTRAVENOUS | Status: AC
  Filled 2020-12-08: qty 250

## 2020-12-08 MED ORDER — ACYCLOVIR 400 MG PO TABS: 400.0000 mg | ORAL_TABLET | Freq: Two times a day (BID) | ORAL | 3 refills | Status: DC

## 2020-12-08 MED ORDER — DEXAMETHASONE 4 MG PO TABS
ORAL_TABLET | ORAL | Status: AC
  Filled 2020-12-08: qty 10

## 2020-12-08 MED ORDER — SODIUM CHLORIDE 0.9 % IV SOLN
300.0000 mg/m2 | Freq: Once | INTRAVENOUS | Status: AC
  Administered 2020-12-08: 680 mg via INTRAVENOUS
  Filled 2020-12-08: qty 34

## 2020-12-08 MED ORDER — BORTEZOMIB CHEMO SQ INJECTION 3.5 MG (2.5MG/ML)
1.3000 mg/m2 | Freq: Once | INTRAMUSCULAR | Status: AC
  Administered 2020-12-08: 3 mg via SUBCUTANEOUS
  Filled 2020-12-08: qty 1.2

## 2020-12-08 NOTE — Patient Instructions (Signed)
[facility] Discharge Instructions for Patients Receiving Chemotherapy  Today you received the following chemotherapy agents cytoxan, velcade  To help prevent nausea and vomiting after your treatment, we encourage you to take your nausea medication as directed.    If you develop nausea and vomiting that is not controlled by your nausea medication, call the clinic.   BELOW ARE SYMPTOMS THAT SHOULD BE REPORTED IMMEDIATELY:  *FEVER GREATER THAN 100.5 F  *CHILLS WITH OR WITHOUT FEVER  NAUSEA AND VOMITING THAT IS NOT CONTROLLED WITH YOUR NAUSEA MEDICATION  *UNUSUAL SHORTNESS OF BREATH  *UNUSUAL BRUISING OR BLEEDING  TENDERNESS IN MOUTH AND THROAT WITH OR WITHOUT PRESENCE OF ULCERS  *URINARY PROBLEMS  *BOWEL PROBLEMS  UNUSUAL RASH Items with * indicate a potential emergency and should be followed up as soon as possible.  Feel free to call the clinic should you have any questions or concerns. The clinic phone number is [phone].  Please show the [facility] at check-in to the Emergency Department and triage nurse.

## 2020-12-08 NOTE — Progress Notes (Signed)
Acyclovir prescription escribed to [facility] on [address]

## 2020-12-12 ENCOUNTER — Telehealth: Payer: Self-pay | Admitting: *Deleted

## 2020-12-12 NOTE — Progress Notes (Signed)
Cardiology Clinic Note   Patient Name: Samuel Butler Date of Encounter: 12/14/2020  Primary Care Provider:  Isaac Bliss, Rayford Halsted, MD Primary Cardiologist:  Quay Burow, MD  Patient Profile    Samuel Butler 60 year old male presents to the clinic today for follow-up evaluation of his lower extremity edema.  Past Medical History    Past Medical History:  Diagnosis Date  . DDD (degenerative disc disease), lumbar    per his report  . Diabetes mellitus without complication ([place])   . Gout 04/16/2013  . Hyperlipidemia   . Hypertension   . IBD (inflammatory bowel disease) 04/05/2016  . Pulmonary embolism ([place])   . Tobacco use disorder 03/28/2015   Past Surgical History:  Procedure Laterality Date  . ABDOMINAL AORTOGRAM W/LOWER EXTREMITY Right 06/26/2020   Procedure: ABDOMINAL AORTOGRAM W/LOWER EXTREMITY;  Surgeon: Lorretta Harp, MD;  Location: [facility];  Service: Cardiovascular;  Laterality: Right;  . COLONOSCOPY    . PERIPHERAL VASCULAR INTERVENTION  06/26/2020   Procedure: PERIPHERAL VASCULAR INTERVENTION;  Surgeon: Lorretta Harp, MD;  Location: [facility];  Service: Cardiovascular;;  Right SFA  . TONSILLECTOMY      Allergies  Allergies  Allergen Reactions  . Crestor [Rosuvastatin Calcium] Other (See Comments)    Joint aches    History of Present Illness    Samuel Butler PMH of essential hypertension, PAD, hepatitis B, type 2 diabetes, AKI, hypokalemia, and vitamin D deficiency.  Dr. Gwenlyn Found performed angiography on 06/26/2020 showing high-grade proximal and mid right SFA disease. Revascularization was performed using right common femoral antegrade approach. A 90% mid left SFA and 95-99% left popliteal lesion with one-vessel runoff was treated. His symptoms of claudication were much improved after intervention. He denied left leg claudication. He reported right groin pain. Ultrasound was ordered and ruled out pseudoaneurysm. He was  found to have an inguinal hernia.  Duringhis 11/16/21visit he complained of occasional chest pain which was severe at times. A coronary CTA was ordered and showed pulmonary embolism. His coronary calcium score was 135 which was 60 percentile for age and sex matched control. A likely CTO of the mid RCA with scattered nonobstructive disease in the left circulation. He was contacted and asked to present to the emergency department for treatment of his PE.  Contacted the nurse triage line on 11/02/2020 with reports of chest discomfort while at the cancer center. He was instructed to increase his Imdur to 60 mg daily at that time.  He presented to the clinic 11/15/19 for follow-up evaluation and statedhe had not noticed much of a difference with increasing his Imdur to 60 mg daily. He reported having chest discomfort at rest when he lays on his left side. He described this as an aching type pain 3 out of 10. He had noticed shortness of breath with increased physical activity and increased lower extremity swelling. He denied chest pain with increased physical activity. We reviewed his previous echocardiogram and he expressed understanding. He reported that he was only been taking his torsemide once daily instead of twice daily. I reviewed his medications and stressed compliance. He reported compliance with his potassium. I will ordered a BMP , order a fasting lipid panel, gave him a salty 6 diet sheet, gave the [place] support stocking sheet, and planned follow-up in 2 weeks.  He presented to the clinic 11/29/2020 for follow-up evaluation stated his breathing had started to improve.  He stated that he was able to complete almost a full  shower before he needed to take a break.  His weight had remained stable.  However, he still had +2 bilateral lower extremity pitting edema.  He reported that he was now taking his torsemide as prescribed 20 mg twice daily.  He stated he had reduced his fluid  consumption and was trying to eat low-sodium diet.  I will increased his torsemide to 40 mg twice daily x3 days and then resumed his normal dosing.  We  increased his potassium to 80 mEq x 3 days and then resumed his normal dosing.  I asked him to further reduce his fluid consumption.  He reported he is drinking about 5 bottles of water daily.  He also noted that he had some pressured urination.  We planned to continue to monitor symptoms at the time.  I will gave him the salty 6 diet sheet, a weight log, and planned follow-up in 2 weeks.  He presents the clinic today for follow-up evaluation states he has begun to notice right groin pain.  He notes pain with his hernia bulging.  He reports that his weight has improved with his increased torsemide dosing.  He continues to follow a low-salt diet.  He reports that he is fatigued after simple tasks such as activities of daily living.  We reviewed his echocardiogram and I reassured him that his heart pumping function is normal and that he does have some impaired relaxation.  He expressed understanding.  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  I have recommended physical therapy and will give him the letter stating that from a cardiac standpoint he may start physical therapy and may return to work without restrictions.  I have also recommended that he obtain some support/hernia undergarments until he can get his inguinal hernia fixed.  He has been put in contact with social work for patient assistance while he is not working at this time.  We will continue his low-salt diet, current medications, have him increase his physical activity as tolerated, and plan follow-up for 3 months.  Today hedenies chest pain, increased shortness of breath, increasedlower extremity edema, fatigue, palpitations, melena, hematuria, hemoptysis, diaphoresis, weakness, presyncope, syncope,  orthopnea, and PND.  Home Medications    Prior to Admission medications   Medication Sig Start Date End Date Taking? Authorizing Provider  acetaminophen (TYLENOL) 650 MG CR tablet Take 1,300 mg by mouth every 8 (eight) hours as needed for pain.     [provider]  acyclovir (ZOVIRAX) 400 MG tablet Take 1 tablet (400 mg total) by mouth 2 (two) times daily. 12/08/20   Orson Slick, MD  albuterol (VENTOLIN HFA) 108 (90 Base) MCG/ACT inhaler Inhale 2 puffs into the lungs every 6 (six) hours as needed for wheezing or shortness of breath. 09/28/20   Orson Slick, MD  apixaban (ELIQUIS) 5 MG TABS tablet Take 1 tablet (5 mg total) by mouth 2 (two) times daily. 11/16/20   Isaac Bliss, Rayford Halsted, MD  atorvastatin (LIPITOR) 80 MG tablet Take 1 tablet (80 mg total) by mouth daily. 12/05/20   Isaac Bliss, Rayford Halsted, MD  clopidogrel (PLAVIX) 75 MG tablet Take 1 tablet (75 mg total) by mouth daily with breakfast. 06/27/20   Tommie Raymond, NP  dicyclomine (BENTYL) 10 MG capsule Take 1 capsule (10 mg total) by mouth 4 (four) times daily. Take 4 times daily before meals and  at bedtime 10/24/20   Levin Erp, PA  ezetimibe (ZETIA) 10 MG tablet Take 1 tablet (10 mg total) by mouth daily. 11/16/20 02/14/21  Deberah Pelton, NP  isosorbide mononitrate (IMDUR) 30 MG 24 hr tablet Take 1 tablet (30 mg total) by mouth daily. 11/14/20   Deberah Pelton, NP  losartan (COZAAR) 50 MG tablet Take 1 tablet (50 mg total) by mouth daily. 07/25/20   Lorretta Harp, MD  metFORMIN (GLUCOPHAGE) 500 MG tablet Take 1 tablet by mouth once daily with breakfast 12/05/20   Isaac Bliss, Rayford Halsted, MD  ondansetron (ZOFRAN) 8 MG tablet TAKE 1 TABLET BY MOUTH EVERY 8 HOURS AS NEEDED FOR NAUSEA OR VOMITING 10/27/20   Orson Slick, MD  pantoprazole (PROTONIX) 40 MG tablet Take 1 tablet (40 mg total) by mouth daily. Patient taking differently: Take 40 mg by mouth daily before breakfast. 07/14/20   Isaac Bliss, Rayford Halsted, MD  potassium chloride SA (KLOR-CON) 20 MEQ tablet Take 2 tablets (40 mEq total) by mouth daily. 80MG  x3 days then back to 40 mg/day 11/29/20   Deberah Pelton, NP  torsemide (DEMADEX) 20 MG tablet Take 1 tablet (20 mg total) by mouth 2 (two) times daily. 40mg  BID x 3 days then back to 20mg  BID 11/29/20   Virna Livengood, [name], NP  VEMLIDY 25 MG TABS Take 1 tablet (25 mg total) by mouth daily. 11/27/20   Mignon Pine, DO  Vitamin D, Ergocalciferol, (DRISDOL) 1.25 MG (50000 UNIT) CAPS capsule Take 1 capsule by mouth once a week 12/05/20   Isaac Bliss, Rayford Halsted, MD    Family History    Family History  Problem Relation Age of Onset  . Hypertension Father   . Diabetes Father   . Stroke Father   . Colon cancer Brother    He indicated that the status of his father is unknown. He indicated that the status of his brother is unknown.  Social History    Social History   Socioeconomic History  . Marital status: Married    Spouse name: Not on file  . Number of children: Not on file  . Years of education: Not on file  . Highest education level: Not on file  Occupational History  . Not on file  Tobacco Use  . Smoking status: Former Smoker    Types: Cigarettes    Quit date: 10/23/2016    Years since quitting: 4.1  . Smokeless tobacco: Never Used  . Tobacco comment: per patient 5 cigarettes a day   Vaping Use  . Vaping Use: Never used  Substance and Sexual Activity  . Alcohol use: Yes    Comment: occasional  . Drug use: No  . Sexual activity: Not on file  Other Topics Concern  . 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 Difficulty of Paying Living Expenses: Somewhat hard  Food Insecurity: No Food  Insecurity  . Worried About Charity fundraiser in the Last Year: Never true  . Ran Out of Food in the Last Year: Never true  Transportation Needs: No Transportation Needs  . Lack  of Transportation (Medical): No  . Lack of Transportation (Non-Medical): No  Physical Activity: Not on file  Stress: Not on file  Social Connections: Not on file  Intimate Partner Violence: Not on file     Review of Systems    General:  No chills, fever, night sweats or weight changes.  Cardiovascular:  No chest pain, dyspnea on exertion, edema, orthopnea, palpitations, paroxysmal nocturnal dyspnea. Dermatological: No rash, lesions/masses Respiratory: No cough, dyspnea Urologic: No hematuria, dysuria Abdominal:   No nausea, vomiting, diarrhea, bright red blood per rectum, melena, or hematemesis Neurologic:  No visual changes, wkns, changes in mental status. All other systems reviewed and are otherwise negative except as noted above.  Physical Exam    VS:  BP 120/85 (BP Location: Left Arm, Patient Position: Sitting)   Pulse 85   Ht 5' 11.5" (1.816 m)   Wt 226 lb (102.5 kg)   SpO2 95%   BMI 31.08 kg/m  , BMI Body mass index is 31.08 kg/m. GEN: Well nourished, well developed, in no acute distress. HEENT: normal. Neck: Supple, no JVD, carotid bruits, or masses. Cardiac: RRR, no murmurs, rubs, or gallops. No clubbing, cyanosis, +1 pitting bilateral lower extremity edema to the knee.  Radials/DP/PT 2+ and equal bilaterally.  Respiratory:  Respirations regular and unlabored, clear to auscultation bilaterally. GI: Soft, nontender, nondistended, BS + x 4. MS: no deformity or atrophy. Skin: warm and dry, no rash. Neuro:  Strength and sensation are intact. Psych: Normal affect.  Accessory Clinical Findings    Recent Labs: 05/25/2020: TSH 1.52 10/18/2020: B Natriuretic Peptide 638.2 12/08/2020: ALT 21; BUN 16; Creatinine 0.91; Hemoglobin 11.3; Platelet Count 125; Potassium 3.5; Sodium 143   Recent Lipid  Panel    Component Value Date/Time   CHOL 303 (H) 11/16/2020 1104   TRIG 158.0 (H) 11/16/2020 1104   HDL 49.80 11/16/2020 1104   CHOLHDL 6 11/16/2020 1104   VLDL 31.6 11/16/2020 1104   LDLCALC 222 (H) 11/16/2020 1104   LDLCALC 278 (H) 05/25/2020 0849   LDLDIRECT 144.7 10/08/2013 0822    ECG personally reviewed by me today-none today.  EKG normal sinus rhythm left axis deviation anterior infarct undetermined age T wave abnormality 84 bpm- No acute changes  Coronary CTA 10/18/2020 IMPRESSION: 1. Evidence of pulmonary embolism. Dr. Gwenlyn Found notified, patient has been sent to ER for evaluation. Enlarged PA size.  2. Severe CAD, CADRADS = 4. CT FFR will be reported separately. Likely CTO mid RCA with scattered nonobstructive disease in the left circulation.  3. Coronary calcium score of 135. This was 56 percentile for age and sex matched control.  4. Co dominant circulation, with individual ostia for the LAD and LCX arising from the [facility].  5. Moderate pericardial effusion  6. Moderately enlarged ascending aorta.  Assessment & Plan   1.  Lower extremity edema-+1 pitting bilateral lower extremity edema.  Weight down 6 pounds..  Creatinine stable at 0.91 on 12/08/2020. Continue torsemide, potassium Heart healthy low-sodium diet-salty 6 given Increase physical activity as tolerated Fluid restriction Daily weights-contact office with Weight gain of 3 pounds overnight or 5 pounds in 1 week  Coronary artery disease-no chest pain today.  No chest discomfort.  Denies recent arm neck and throat pain.  Chest discomfort when he lays on his left side is resolved.   Underwent coronary CTA 10/18/2020 which showed nonobstructive CAD with a CTO of his mid RCA. Moderate pericardial effusion. A pulmonary embolus was identified. Patient was contacted and instructed to report  to the emergency department. Continueatorvastatin, Plavix, Eliquis Continue Imdur 30 mg Heart healthy  low-sodium diet-salty 6 given Increase physical activity as tolerated  Peripheral vascular disease-continues to deny claudication. Continues with increased physical activity. Continueatorvastatin, Plavix Heart healthy low-sodium diet-salty 6 given Increase physical activity as tolerated  Essential hypertension-BP today  120/85. Well-controlled at home Continuelosartan, Imdur, torsemide, potassium Heart healthy low-sodium diet-salty 6 given Increase physical activity as tolerated  Hyperlipidemia-11/16/2020: Cholesterol 303; HDL 49.80; LDL Cholesterol 222; Triglycerides 158.0; VLDL 31.6 Continue atorvastatin, ezetimibe Heart healthy low-sodium high-fiber diet Previously refered to lipid clinic  Disposition: Follow-up with Dr. Gwenlyn Found in 3 months.  [name]. Kyleena Scheirer NP-C    12/14/2020, 1:15 PM [facility] [place] [address] Office [phone] Fax [phone]  Notice: This dictation was prepared with Dragon dictation along with smaller phrase technology. Any transcriptional errors that result from this process are unintentional and may not be corrected upon review.  I spent 20 minutes examining this patient, reviewing medications, and using patient centered shared decision making involving her cardiac care.  Prior to her visit I spent greater than 20 minutes reviewing her past medical history,  medications, and prior cardiac tests.

## 2020-12-12 NOTE — Telephone Encounter (Signed)
12/07/2020 "Patient Message" regarding disability, claim: [id number] forwarded to this nurse; read 12/11/2020.    No further activity required for this claim.       Form returned [employer] 10/27/2020.    10/30/2020 returned request that read from [place] to [facility] included records request.    Suggested collaborative nurse advise patient to contact (SW) H.I.M. for status check.  [employer] requested updated medical information from 10/27/2020 may require updated H.I.P.A.A signed release.    Connected with [employer] to determine if new provider statement needed.    [employer] reports "disability claim mailed to patient. Faxed 10/27/2020 to office [phone]) attention Enos Fling received form 10/27/2020 yet all records were not received."  Advised name and fax number not associated with [place].  Dates do not correspond with dates [facility] received requests.     "Disability initiated for cardiac diagnosis by Dr. Quay Burow MD.  Will notify claims manager to connect with cardiology because of diagnosis used for claim.    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

## 2020-12-13 ENCOUNTER — Ambulatory Visit: Payer: BC Managed Care – PPO | Admitting: Gastroenterology

## 2020-12-14 ENCOUNTER — Ambulatory Visit: Payer: BC Managed Care – PPO | Admitting: Pharmacist

## 2020-12-14 ENCOUNTER — Encounter: Payer: Self-pay | Admitting: General Practice

## 2020-12-14 ENCOUNTER — Ambulatory Visit (INDEPENDENT_AMBULATORY_CARE_PROVIDER_SITE_OTHER): Payer: BC Managed Care – PPO | Admitting: General Practice

## 2020-12-14 ENCOUNTER — Telehealth: Payer: Self-pay | Admitting: Internal Medicine

## 2020-12-14 ENCOUNTER — Telehealth: Payer: Self-pay | Admitting: Licensed Clinical Social Worker

## 2020-12-14 ENCOUNTER — Other Ambulatory Visit: Payer: Self-pay

## 2020-12-14 VITALS — BP 120/85 | HR 85 | Ht 71.5 in | Wt 226.0 lb

## 2020-12-14 DIAGNOSIS — I739 Peripheral vascular disease, unspecified: Secondary | ICD-10-CM

## 2020-12-14 DIAGNOSIS — E785 Hyperlipidemia, unspecified: Secondary | ICD-10-CM | POA: Diagnosis not present

## 2020-12-14 DIAGNOSIS — R600 Localized edema: Secondary | ICD-10-CM

## 2020-12-14 DIAGNOSIS — I10 Essential (primary) hypertension: Secondary | ICD-10-CM

## 2020-12-14 DIAGNOSIS — I2510 Atherosclerotic heart disease of native coronary artery without angina pectoris: Secondary | ICD-10-CM | POA: Diagnosis not present

## 2020-12-14 NOTE — Telephone Encounter (Signed)
No need to see me for PT referral. We can place orders for OP PT to eval and treat. What forms to return to work is he talking about?

## 2020-12-14 NOTE — Telephone Encounter (Signed)
LCSW called pt [employer] coverage at [phone], requested that pt case worker fax paperwork to [place] office to be completed. Office staff Marolyn Hammock has confirmed when we receive it she can assist with completion.   [employer] shared that caseworker Sharrie Rothman would give me a call back within 24 hours but that given current claim volume it could potentially take up to 5 business days to receive paperwork.   Raquel, PharmD was able to communicate this to pt before he left the office today.   I will f/u if I have not heard back from [employer] within 24 hrs.   Westley Hummer, MSW, [place]  [phone]

## 2020-12-14 NOTE — Patient Instructions (Signed)
Medication Instructions:  The current medical regimen is effective;  continue present plan and medications as directed. Please refer to the Current Medication list given to you today.  *If you need a refill on your cardiac medications before your next appointment, please call your pharmacy*  Lab Work:   Testing/Procedures:  NONE    NONE  Special Instructions  PLEASE READ AND FOLLOW SALTY 6-ATTACHED-1,800mg  daily  PLEASE INCREASE PHYSICAL ACTIVITY AS TOLERATED  PLEASE PURCHASE AND [place].  Follow-Up: Your next appointment:  3 month(s) In Person with Skeet Latch, MD ONLY  At [facility], you and your health needs are our priority.  As part of our continuing mission to provide you with exceptional heart care, we have created designated Provider Care Teams.  These Care Teams include your primary Cardiologist (physician) and Advanced Practice Providers (APPs -  Physician Assistants and Nurse Practitioners) who all work together to provide you with the care you need, when you need it.  We recommend signing up for the patient portal called "MyChart".  Sign up information is provided on this After Visit Summary.  MyChart is used to connect with patients for Virtual Visits (Telemedicine).  Patients are able to view lab/test results, encounter notes, upcoming appointments, etc.  Non-urgent messages can be sent to your provider as well.   To learn more about what you can do with MyChart, go to [email or web address].              6 SALTY THINGS TO AVOID     1,800MG  DAILY

## 2020-12-14 NOTE — Progress Notes (Unsigned)
Patient ID: Samuel Butler                 DOB: May 26, 1961                    MRN: [id number]     HPI:  Samuel Butler is a 60 y.o. male patient of Dr Gwenlyn Found referred to lipid clinic by . PMH is significant for DM, gout, heperlipidemia, PAD s/p revascularization, nonobstructive CAD hypertension, hepatitis B, hypokalemia, and inflammatory bowel disease.   Current Medications:  Atorvastatin 80mg  daily Ezetimibe 10mg  daily  Intolerances:   LDL goal:   Diet:   Exercise:   Family History:   Social History: former smoker, denies vaping, occasional alcohol use  Labs:  Past Medical History:  Diagnosis Date  . DDD (degenerative disc disease), lumbar    per his report  . Diabetes mellitus without complication ([place])   . Gout 04/16/2013  . Hyperlipidemia   . Hypertension   . IBD (inflammatory bowel disease) 04/05/2016  . Pulmonary embolism ([place])   . Tobacco use disorder 03/28/2015    Current Outpatient Medications on File Prior to Visit  Medication Sig Dispense Refill  . acetaminophen (TYLENOL) 650 MG CR tablet Take 1,300 mg by mouth every 8 (eight) hours as needed for pain.     Marland Kitchen acyclovir (ZOVIRAX) 400 MG tablet Take 1 tablet (400 mg total) by mouth 2 (two) times daily. 60 tablet 3  . albuterol (VENTOLIN HFA) 108 (90 Base) MCG/ACT inhaler Inhale 2 puffs into the lungs every 6 (six) hours as needed for wheezing or shortness of breath. 8 g 0  . apixaban (ELIQUIS) 5 MG TABS tablet Take 1 tablet (5 mg total) by mouth 2 (two) times daily. 180 tablet 1  . atorvastatin (LIPITOR) 80 MG tablet Take 1 tablet (80 mg total) by mouth daily. 90 tablet 2  . clopidogrel (PLAVIX) 75 MG tablet Take 1 tablet (75 mg total) by mouth daily with breakfast. 90 tablet 3  . dicyclomine (BENTYL) 10 MG capsule Take 1 capsule (10 mg total) by mouth 4 (four) times daily. Take 4 times daily before meals and at bedtime 90 capsule 0  . ezetimibe (ZETIA) 10 MG tablet Take 1 tablet (10 mg total) by mouth daily. 90  tablet 3  . isosorbide mononitrate (IMDUR) 30 MG 24 hr tablet Take 1 tablet (30 mg total) by mouth daily.    Marland Kitchen losartan (COZAAR) 50 MG tablet Take 1 tablet (50 mg total) by mouth daily. 90 tablet 3  . metFORMIN (GLUCOPHAGE) 500 MG tablet Take 1 tablet by mouth once daily with breakfast 90 tablet 1  . ondansetron (ZOFRAN) 8 MG tablet TAKE 1 TABLET BY MOUTH EVERY 8 HOURS AS NEEDED FOR NAUSEA OR VOMITING 30 tablet 0  . pantoprazole (PROTONIX) 40 MG tablet Take 1 tablet (40 mg total) by mouth daily. (Patient taking differently: Take 40 mg by mouth daily before breakfast.) 90 tablet 1  . potassium chloride SA (KLOR-CON) 20 MEQ tablet Take 2 tablets (40 mEq total) by mouth daily. 80MG  x3 days then back to 40 mg/day 33 tablet 6  . torsemide (DEMADEX) 20 MG tablet Take 1 tablet (20 mg total) by mouth 2 (two) times daily. 40mg  BID x 3 days then back to 20mg  BID 33 tablet 6  . VEMLIDY 25 MG TABS Take 1 tablet (25 mg total) by mouth daily. 30 tablet 5  . Vitamin D, Ergocalciferol, (DRISDOL) 1.25 MG (50000 UNIT) CAPS capsule Take 1  capsule by mouth once a week 12 capsule 0   No current facility-administered medications on file prior to visit.    Allergies  Allergen Reactions  . Crestor [Rosuvastatin Calcium] Other (See Comments)    Joint aches    No problem-specific Assessment & Plan notes found for this encounter.    Tarica Harl Rodriguez-Guzman PharmD, BCPS, [place] [address] [place] [address] 12/14/2020 8:46 AM

## 2020-12-14 NOTE — Telephone Encounter (Signed)
Patient was also asking for advise/forms to return to work

## 2020-12-14 NOTE — Progress Notes (Signed)
Heart and Vascular Care Navigation  12/14/2020  Samuel Butler Nov 14, 1960 [id number]  Reason for Referral:  Pt check in; out of work on short term disability                                                                                                   Assessment:                                     LCSW spoke with pt at request of PharmD Raquel. Pt had an appointment with Pharmacy and Denyse Amass today at [facility]. I introduced self, role, reason for visit. Pt amenable to speaking with this Probation officer. He confirmed home address, PCP, emergency contact is his wife. Pt able to pick up and afford his medications at this time. He is overwhelmed by the amount of medications he has and would like clarification on what medications are for to ensure they are all prescribed appropriately, he is taking them right and none are interacting negatively with others. He has a f/u appointment with pharmacy where he plans to bring them all in and go over them with our staff.   Pt has had a long medical journey over the past 7 months or so. He has ongoing cardiology concerns and while those were being addressing those he was diagnosed with cancer. He is followed by Dr. Lorenso Courier at the [facility]. He also has diabetes. He shares that oftentimes he feels he is not sure what is going on with his health, especially related to the clinical picture of what to expect. Pt shares this stress that he feels often times effects him and in turn his wife. He considers her a good support for him. He is aware of the many programs that exist at the [facility] that could provide them individual and couple's support.   I encouraged him to have providers provide as much literature and write things down so that he feels more confident with what was shared with him and can have his wife review it also. I shared that given everything going on it is understandable that he may feel lost in communication and it is our job as providers to help make  sure he feels comfortable with understani  Pt final concern related to his short term disability, he shares that he is in contact with his HR team at work and would like to continue his short term disability, he does not feel he can return to work yet. There has been some confusion related to the paperwork. I got the [employer] number and shared that I would call and see if we could have it faxed to our office.   HRT/VAS Care Coordination    Patients Home Cardiology Office [place] Team Social Worker   Social Worker Name: Margarito Liner [place], [phone]   Living arrangements for the past 2 months Single Family Home   Lives with: Spouse   Patient Current Insurance Coverage Managed Medicare  Patient Has Concern With Paying Medical Bills No   Does Patient Have Prescription Coverage? Yes   Home Assistive Devices/Equipment None   DME Agency NA      Social History:                                                                             SDOH Screenings   Alcohol Screen: Not on file  Depression (PHQ2-9): Low Risk   . PHQ-2 Score: 0  Financial Resource Strain: Medium Risk  . Difficulty of Paying Living Expenses: Somewhat hard  Food Insecurity: No Food Insecurity  . Worried About Charity fundraiser in the Last Year: Never true  . Ran Out of Food in the Last Year: Never true  Housing: Low Risk   . Last Housing Risk Score: 0  Physical Activity: Not on file  Social Connections: Not on file  Stress: Not on file  Tobacco Use: Medium Risk  . Smoking Tobacco Use: Former Smoker  . Smokeless Tobacco Use: Never Used  Transportation Needs: No Transportation Needs  . Lack of Transportation (Medical): No  . Lack of Transportation (Non-Medical): No    SDOH Interventions: Financial Resources:  Sales promotion account executive Interventions: Intervention Not Indicated   Food Insecurity:  Food Insecurity Interventions: Intervention Not Indicated  Housing  Insecurity:  Housing Interventions: Intervention Not Indicated  Transportation:   Transportation Interventions: Intervention Not Indicated    Other Care Navigation Interventions:     Patient expressed Mental Health concerns Stress related to everything going on with his health, employment etc. Pt aware supportive programming available at [employer] and that I can connect him as interested today.    Follow-up plan:   LCSW will f/u with pt regarding short term disability paperwork after speaking with [employer] representative. I remain available as needed moving forward.

## 2020-12-14 NOTE — Telephone Encounter (Signed)
The patient seen the cardiologist today and he recommended that he schedule an appointment with Dr. Jerilee Hoh for a referral for physical therapy.   Please advise

## 2020-12-15 ENCOUNTER — Inpatient Hospital Stay (HOSPITAL_BASED_OUTPATIENT_CLINIC_OR_DEPARTMENT_OTHER): Payer: BC Managed Care – PPO | Admitting: Hematology and Oncology

## 2020-12-15 ENCOUNTER — Other Ambulatory Visit: Payer: Self-pay

## 2020-12-15 ENCOUNTER — Inpatient Hospital Stay: Payer: BC Managed Care – PPO

## 2020-12-15 ENCOUNTER — Other Ambulatory Visit: Payer: BC Managed Care – PPO

## 2020-12-15 ENCOUNTER — Inpatient Hospital Stay: Payer: BC Managed Care – PPO | Attending: Hematology and Oncology

## 2020-12-15 VITALS — BP 129/104 | HR 89 | Temp 97.0°F | Resp 18 | Ht 71.5 in | Wt 228.3 lb

## 2020-12-15 VITALS — BP 126/90

## 2020-12-15 DIAGNOSIS — N049 Nephrotic syndrome with unspecified morphologic changes: Secondary | ICD-10-CM | POA: Diagnosis not present

## 2020-12-15 DIAGNOSIS — F17210 Nicotine dependence, cigarettes, uncomplicated: Secondary | ICD-10-CM | POA: Insufficient documentation

## 2020-12-15 DIAGNOSIS — Z800 Family history of malignant neoplasm of digestive organs: Secondary | ICD-10-CM | POA: Insufficient documentation

## 2020-12-15 DIAGNOSIS — Z823 Family history of stroke: Secondary | ICD-10-CM | POA: Diagnosis not present

## 2020-12-15 DIAGNOSIS — E854 Organ-limited amyloidosis: Secondary | ICD-10-CM

## 2020-12-15 DIAGNOSIS — B181 Chronic viral hepatitis B without delta-agent: Secondary | ICD-10-CM | POA: Diagnosis not present

## 2020-12-15 DIAGNOSIS — Z79899 Other long term (current) drug therapy: Secondary | ICD-10-CM | POA: Diagnosis not present

## 2020-12-15 DIAGNOSIS — Z833 Family history of diabetes mellitus: Secondary | ICD-10-CM | POA: Insufficient documentation

## 2020-12-15 DIAGNOSIS — E119 Type 2 diabetes mellitus without complications: Secondary | ICD-10-CM | POA: Diagnosis not present

## 2020-12-15 DIAGNOSIS — Z5112 Encounter for antineoplastic immunotherapy: Secondary | ICD-10-CM | POA: Insufficient documentation

## 2020-12-15 DIAGNOSIS — Z8249 Family history of ischemic heart disease and other diseases of the circulatory system: Secondary | ICD-10-CM | POA: Insufficient documentation

## 2020-12-15 DIAGNOSIS — E785 Hyperlipidemia, unspecified: Secondary | ICD-10-CM | POA: Insufficient documentation

## 2020-12-15 DIAGNOSIS — E8581 Light chain (AL) amyloidosis: Secondary | ICD-10-CM | POA: Diagnosis present

## 2020-12-15 DIAGNOSIS — Z5111 Encounter for antineoplastic chemotherapy: Secondary | ICD-10-CM | POA: Insufficient documentation

## 2020-12-15 LAB — CMP (CANCER CENTER ONLY)
ALT: 17 U/L (ref 0–44)
AST: 18 U/L (ref 15–41)
Albumin: 1.3 g/dL — ABNORMAL LOW (ref 3.5–5.0)
Alkaline Phosphatase: 78 U/L (ref 38–126)
Anion gap: 8 (ref 5–15)
BUN: 17 mg/dL (ref 6–20)
CO2: 30 mmol/L (ref 22–32)
Calcium: 7.6 mg/dL — ABNORMAL LOW (ref 8.9–10.3)
Chloride: 107 mmol/L (ref 98–111)
Creatinine: 1.01 mg/dL (ref 0.61–1.24)
GFR, Estimated: >60 mL/min (ref >60)
Glucose, Bld: 87 mg/dL (ref 70–99)
Potassium: 3.7 mmol/L (ref 3.5–5.1)
Sodium: 145 mmol/L (ref 135–145)
Total Bilirubin: 0.3 mg/dL (ref 0.3–1.2)
Total Protein: 4.1 g/dL — ABNORMAL LOW (ref 6.5–8.1)

## 2020-12-15 LAB — CBC WITH DIFFERENTIAL (CANCER CENTER ONLY)
Abs Immature Granulocytes: 0.03 10*3/uL (ref 0.00–0.07)
Basophils Absolute: 0 10*3/uL (ref 0.0–0.1)
Basophils Relative: 0 %
Eosinophils Absolute: 0.1 10*3/uL (ref 0.0–0.5)
Eosinophils Relative: 2 %
HCT: 32.3 % — ABNORMAL LOW (ref 39.0–52.0)
Hemoglobin: 10.6 g/dL — ABNORMAL LOW (ref 13.0–17.0)
Immature Granulocytes: 1 %
Lymphocytes Relative: 23 %
Lymphs Abs: 1.5 10*3/uL (ref 0.7–4.0)
MCH: 31.4 pg (ref 26.0–34.0)
MCHC: 32.8 g/dL (ref 30.0–36.0)
MCV: 95.6 fL (ref 80.0–100.0)
Monocytes Absolute: 0.6 10*3/uL (ref 0.1–1.0)
Monocytes Relative: 9 %
Neutro Abs: 4.1 10*3/uL (ref 1.7–7.7)
Neutrophils Relative %: 65 %
Platelet Count: 175 10*3/uL (ref 150–400)
RBC: 3.38 MIL/uL — ABNORMAL LOW (ref 4.22–5.81)
RDW: 17.1 % — ABNORMAL HIGH (ref 11.5–15.5)
WBC Count: 6.4 10*3/uL (ref 4.0–10.5)
nRBC: 0 % (ref 0.0–0.2)

## 2020-12-15 LAB — LACTATE DEHYDROGENASE: LDH: 273 U/L — ABNORMAL HIGH (ref 98–192)

## 2020-12-15 MED ORDER — DIPHENHYDRAMINE HCL 25 MG PO CAPS
ORAL_CAPSULE | ORAL | Status: AC
  Filled 2020-12-15: qty 2

## 2020-12-15 MED ORDER — DIPHENHYDRAMINE HCL 25 MG PO CAPS
50.0000 mg | ORAL_CAPSULE | Freq: Once | ORAL | Status: AC
  Administered 2020-12-15: 50 mg via ORAL

## 2020-12-15 MED ORDER — ACETAMINOPHEN 325 MG PO TABS
ORAL_TABLET | ORAL | Status: AC
  Filled 2020-12-15: qty 2

## 2020-12-15 MED ORDER — SODIUM CHLORIDE 0.9 % IV SOLN
INTRAVENOUS | Status: DC
  Filled 2020-12-15: qty 250

## 2020-12-15 MED ORDER — BORTEZOMIB CHEMO SQ INJECTION 3.5 MG (2.5MG/ML)
1.3000 mg/m2 | Freq: Once | INTRAMUSCULAR | Status: AC
  Administered 2020-12-15: 3 mg via SUBCUTANEOUS
  Filled 2020-12-15: qty 1.2

## 2020-12-15 MED ORDER — SODIUM CHLORIDE 0.9 % IV SOLN
300.0000 mg/m2 | Freq: Once | INTRAVENOUS | Status: AC
  Administered 2020-12-15: 680 mg via INTRAVENOUS
  Filled 2020-12-15: qty 34

## 2020-12-15 MED ORDER — ACETAMINOPHEN 325 MG PO TABS
650.0000 mg | ORAL_TABLET | Freq: Once | ORAL | Status: AC
  Administered 2020-12-15: 650 mg via ORAL

## 2020-12-15 MED ORDER — DEXAMETHASONE 4 MG PO TABS
40.0000 mg | ORAL_TABLET | Freq: Once | ORAL | Status: AC
  Administered 2020-12-15: 40 mg via ORAL

## 2020-12-15 MED ORDER — DEXAMETHASONE 4 MG PO TABS
ORAL_TABLET | ORAL | Status: AC
  Filled 2020-12-15: qty 10

## 2020-12-15 MED ORDER — DARATUMUMAB-HYALURONIDASE-FIHJ 1800-30000 MG-UT/15ML ~~LOC~~ SOLN
1800.0000 mg | Freq: Once | SUBCUTANEOUS | Status: AC
  Administered 2020-12-15: 1800 mg via SUBCUTANEOUS
  Filled 2020-12-15: qty 15

## 2020-12-15 NOTE — Telephone Encounter (Signed)
Left message on machine for patient to return our call 

## 2020-12-15 NOTE — Patient Instructions (Signed)
[facility] Discharge Instructions for Patients Receiving Chemotherapy  Today you received the following chemotherapy agents cytoxan, velcade, darzalex.  To help prevent nausea and vomiting after your treatment, we encourage you to take your nausea medication as directed.    If you develop nausea and vomiting that is not controlled by your nausea medication, call the clinic.   BELOW ARE SYMPTOMS THAT SHOULD BE REPORTED IMMEDIATELY:  *FEVER GREATER THAN 100.5 F  *CHILLS WITH OR WITHOUT FEVER  NAUSEA AND VOMITING THAT IS NOT CONTROLLED WITH YOUR NAUSEA MEDICATION  *UNUSUAL SHORTNESS OF BREATH  *UNUSUAL BRUISING OR BLEEDING  TENDERNESS IN MOUTH AND THROAT WITH OR WITHOUT PRESENCE OF ULCERS  *URINARY PROBLEMS  *BOWEL PROBLEMS  UNUSUAL RASH Items with * indicate a potential emergency and should be followed up as soon as possible.  Feel free to call the clinic should you have any questions or concerns. The clinic phone number is [phone].  Please show the CHEMO ALERT CARD at check-in to the Emergency Department and triage nurse.   

## 2020-12-17 NOTE — Progress Notes (Signed)
[place] [phone]   [phone]  PROGRESS NOTE  Patient Care Team: Isaac Bliss, Rayford Halsted, MD as PCP - General (Internal Medicine) Lorretta Harp, MD as PCP - Cardiology (Cardiology)  Hematological/Oncological History # AL Amyloidosis of the Kidney 1) 08/03/2020: Free kappa 19.5, Lambda 205.7, Kappa/Lambda ratio: 0.09. M protein undetectable in serum.  2) 08/07/2020: kidney biopsy performed, results consistent with lambda light chain AL amyloidosis 3) 08/23/2020: establish care with Dr. Lorenso Courier  4) 09/01/2020: bone marrow biopsy shows plasma cell neoplasm with focal amyloid deposits 5) 09/29/2020: start of Cycle 1 Day 1 of Dara-CyBorD 6) 11/02/2020: Cycle 2 Day 1 of Dara-CyBorD 7) 12/01/2020: Cycle 3 Day 1 of Dara-CyBorD  Interval History:  Jahzeel A Edds 60 y.o. male with medical history significant for AL amyloidosis of the kidney presents for a follow up visit. The patient's last visit was on 12/01/2020. In the interim since his last visit he has continued his treatment of Dara-CyBorD.   On exam today Mr. Doubek notes no major changes in his health since his last visit 2 weeks ago.  He notes he is been going through physical therapy in order to try and improve his functional status of the knee back to work.  He reports that this was ordered through his PCP.  His weight is currently down to 228 pounds from 231, though this may represent loss of fluid.  He does that he is urinating a lot, and he continues to have darker and foamy urine.  He does report having some emotional issues in the interim since her last visit, though he notes that they are improved.  He does have some mucus production elevated with sore throat, but no cough or fever.  He does also occasionally have some episodes of diarrhea which occurs once or twice per week.  He thinks it may be related to either the Metformin or his chemotherapy treatment.  It does not interfere with his  day-to-day life.  On further discussion he denies having issues with fevers, chills, sweats, nausea, vomiting or numbness.  He is having some fatigue and tiredness but overall has been well.  A full 10 point ROS is listed below.  MEDICAL HISTORY:  Past Medical History:  Diagnosis Date  . DDD (degenerative disc disease), lumbar    per his report  . Diabetes mellitus without complication ([place])   . Gout 04/16/2013  . Hyperlipidemia   . Hypertension   . IBD (inflammatory bowel disease) 04/05/2016  . Pulmonary embolism ([place])   . Tobacco use disorder 03/28/2015    SURGICAL HISTORY: Past Surgical History:  Procedure Laterality Date  . ABDOMINAL AORTOGRAM W/LOWER EXTREMITY Right 06/26/2020   Procedure: ABDOMINAL AORTOGRAM W/LOWER EXTREMITY;  Surgeon: Lorretta Harp, MD;  Location: [facility];  Service: Cardiovascular;  Laterality: Right;  . COLONOSCOPY    . PERIPHERAL VASCULAR INTERVENTION  06/26/2020   Procedure: PERIPHERAL VASCULAR INTERVENTION;  Surgeon: Lorretta Harp, MD;  Location: [facility];  Service: Cardiovascular;;  Right SFA  . TONSILLECTOMY      SOCIAL HISTORY: Social History   Socioeconomic History  . Marital status: Married    Spouse name: Not on file  . Number of children: Not on file  . Years of education: Not on file  . Highest education level: Not on file  Occupational History  . Not on file  Tobacco Use  . Smoking status: Former Smoker    Types: Cigarettes  Quit date: 10/23/2016    Years since quitting: 4.1  . Smokeless tobacco: Never Used  . Tobacco comment: per patient 5 cigarettes a day   Vaping Use  . Vaping Use: Never used  Substance and Sexual Activity  . Alcohol use: Yes    Comment: occasional  . Drug use: No  . Sexual activity: Not on file  Other Topics Concern  . 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 Difficulty of Paying Living Expenses: Somewhat hard  Food Insecurity: No Food Insecurity  . Worried About Charity fundraiser in the Last Year: Never true  . Ran Out of Food in the Last Year: Never true  Transportation Needs: No Transportation Needs  . Lack of Transportation (Medical): No  . Lack of Transportation (Non-Medical): No  Physical Activity: Not on file  Stress: Not on file  Social Connections: Not on file  Intimate Partner Violence: Not on file    FAMILY HISTORY: Family History  Problem Relation Age of Onset  . Hypertension Father   . Diabetes Father   . Stroke Father   . Colon cancer Brother     ALLERGIES:  is allergic to crestor [rosuvastatin calcium].  MEDICATIONS:  Current Outpatient Medications  Medication Sig Dispense Refill  . acetaminophen (TYLENOL) 650 MG CR tablet Take 1,300 mg by mouth every 8 (eight) hours as needed for pain.     Marland Kitchen acyclovir (ZOVIRAX) 400 MG tablet Take 1 tablet (400 mg total) by mouth 2 (two) times daily. 60 tablet 3  . albuterol (VENTOLIN HFA) 108 (90 Base) MCG/ACT inhaler Inhale 2 puffs into the lungs every 6 (six) hours as needed for wheezing or shortness of breath. 8 g 0  . apixaban (ELIQUIS) 5 MG TABS tablet Take 1 tablet (5 mg total) by mouth 2 (two) times daily. 180 tablet 1  . atorvastatin (LIPITOR) 80 MG tablet Take 1 tablet (80 mg total) by mouth daily. 90 tablet 2  . clopidogrel (PLAVIX) 75 MG tablet Take 1 tablet (75 mg total) by mouth daily with breakfast. 90 tablet 3  . dicyclomine (BENTYL) 10 MG capsule Take 1 capsule (10 mg total) by mouth 4 (four) times daily. Take 4 times daily before meals and at bedtime 90 capsule 0  . ezetimibe (ZETIA) 10 MG tablet Take 1 tablet (10 mg total) by mouth daily. 90 tablet 3  . isosorbide mononitrate (IMDUR) 30 MG 24 hr tablet Take 1 tablet (30 mg  total) by mouth daily.    Marland Kitchen losartan (COZAAR) 50 MG tablet Take 1 tablet (50 mg total) by mouth daily. 90 tablet 3  . metFORMIN (GLUCOPHAGE) 500 MG tablet Take 1 tablet by mouth once daily with breakfast 90 tablet 1  . ondansetron (ZOFRAN) 8 MG tablet TAKE 1 TABLET BY MOUTH EVERY 8 HOURS AS NEEDED FOR NAUSEA OR VOMITING 30 tablet 0  . pantoprazole (PROTONIX) 40 MG tablet Take 1 tablet (40 mg total) by mouth daily. (Patient taking differently: Take 40 mg by mouth daily before breakfast.) 90 tablet 1  . potassium chloride SA (KLOR-CON) 20 MEQ tablet Take 2 tablets (40 mEq total) by mouth  daily. 80MG x3 days then back to 40 mg/day 33 tablet 6  . torsemide (DEMADEX) 20 MG tablet Take 1 tablet (20 mg total) by mouth 2 (two) times daily. 40m BID x 3 days then back to 250mBID 33 tablet 6  . VEMLIDY 25 MG TABS Take 1 tablet (25 mg total) by mouth daily. 30 tablet 5  . Vitamin D, Ergocalciferol, (DRISDOL) 1.25 MG (50000 UNIT) CAPS capsule Take 1 capsule by mouth once a week 12 capsule 0   No current facility-administered medications for this visit.    REVIEW OF SYSTEMS:   Constitutional: ( - ) fevers, ( - )  chills , ( - ) night sweats Eyes: ( - ) blurriness of vision, ( - ) double vision, ( - ) watery eyes Ears, nose, mouth, throat, and face: ( - ) mucositis, ( - ) sore throat Respiratory: ( - ) cough, ( - ) dyspnea, ( - ) wheezes Cardiovascular: ( - ) palpitation, ( - ) chest discomfort, ( - ) lower extremity swelling Gastrointestinal:  ( - ) nausea, ( - ) heartburn, ( - ) change in bowel habits Skin: ( - ) abnormal skin rashes Lymphatics: ( - ) new lymphadenopathy, ( - ) easy bruising Neurological: ( - ) numbness, ( - ) tingling, ( - ) new weaknesses Behavioral/Psych: ( - ) mood change, ( - ) new changes  All other systems were reviewed with the patient and are negative.  PHYSICAL EXAMINATION: ECOG PERFORMANCE STATUS: 1 - Symptomatic but completely ambulatory  Vitals:   12/15/20 0950  BP:  (!) 129/104  Pulse: 89  Resp: 18  Temp: (!) 97 F (36.1 C)  SpO2: 97%   Filed Weights   12/15/20 0950  Weight: 228 lb 4.8 oz (103.6 kg)    GENERAL: well appearing middle aged AfSerbiamerican male alert, no distress and comfortable SKIN: skin color, texture, turgor are normal, no rashes or significant lesions EYES: conjunctiva are pink and non-injected, sclera clear LUNGS: clear to auscultation and percussion with normal breathing effort HEART: regular rate & rhythm and no murmurs and +2 lower extremity edema bilaterally Musculoskeletal: no cyanosis of digits and no clubbing  PSYCH: alert & oriented x 3, fluent speech NEURO: no focal motor/sensory deficits  LABORATORY DATA:  I have reviewed the data as listed CBC Latest Ref Rng & Units 12/15/2020 12/08/2020 12/01/2020  WBC 4.0 - 10.5 K/uL 6.4 6.3 4.8  Hemoglobin 13.0 - 17.0 g/dL 10.6(L) 11.3(L) 11.8(L)  Hematocrit 39.0 - 52.0 % 32.3(L) 34.6(L) 34.9(L)  Platelets 150 - 400 K/uL 175 125(L) 131(L)    CMP Latest Ref Rng & Units 12/15/2020 12/08/2020 12/01/2020  Glucose 70 - 99 mg/dL 87 88 93  BUN 6 - 20 mg/dL 17 16 19   Creatinine 0.61 - 1.24 mg/dL 1.01 0.91 1.06  Sodium 135 - 145 mmol/L 145 143 144  Potassium 3.5 - 5.1 mmol/L 3.7 3.5 3.7  Chloride 98 - 111 mmol/L 107 107 107  CO2 22 - 32 mmol/L 30 30 30   Calcium 8.9 - 10.3 mg/dL 7.6(L) 7.6(L) 7.7(L)  Total Protein 6.5 - 8.1 g/dL 4.1(L) 4.0(L) 4.1(L)  Total Bilirubin 0.3 - 1.2 mg/dL 0.3 0.5 0.5  Alkaline Phos 38 - 126 U/L 78 78 75  AST 15 - 41 U/L 18 20 23   ALT 0 - 44 U/L 17 21 26     Lab Results  Component Value Date   MPROTEIN 0.1 (H) 11/17/2020   MPROTEIN 0.1 (H) 10/13/2020   MPROTEIN  Not Observed 08/23/2020   Lab Results  Component Value Date   KPAFRELGTCHN 12.1 11/17/2020   KPAFRELGTCHN 10.0 10/13/2020   KPAFRELGTCHN 18.5 08/23/2020   LAMBDASER 27.4 (H) 11/17/2020   LAMBDASER 56.1 (H) 10/13/2020   LAMBDASER 199.1 (H) 08/23/2020   KAPLAMBRATIO 0.76 (L) 11/21/2020    KAPLAMBRATIO 0.44 11/17/2020   KAPLAMBRATIO 0.36 (L) 10/16/2020     RADIOGRAPHIC STUDIES: No results found.  ASSESSMENT & PLAN Mohd. A Kubicki 60 y.o. male with medical history significant for AL amyloidosis of the kidney presents for a follow up visit.  After review the labs, the records, discussion with the patient the findings are most consistent with AL amyloidosis affecting the kidney.  As such I would recommended we start treatment with Dara CyBorD.  After reviewing the biopsy results his findings are most consistent with AL amyloidosis. As such the treatment of choice would be to target his plasma cell population with a triplet or quadruplet therapy. Therapy of choice in this case would consist of daratumumab, Velcade, cyclophosphamide, and dexamethasone. Given the patient's good functional status we will start with full dose Dara-CyBorD. I previously discussed the side effects of this chemotherapy with the patient including neuropathy, elevated blood pressure, drop in blood counts, hypersensitivity reaction, chest tightness, increased infection risk, and fatigue. The patient and family voiced their understanding of these findings and are agreeable to moving forward with quadruple therapy with the goal of being a bridge to bone marrow transplant.   The regimen of choice is daratumumab, bortezomib, cyclophosphamide and dexamethasone per the ANDROMEDA Study ( Blood. 2020 Jul 2;136(1):71-80). Treatment consists of: Cyclophosphamide 300 mg/m2 intravenously and bortezomib 1.3 mg/m2 subcutaneously were given on days 1, 8, 15, and 22 of each 28 day cycle for up to 6 cycles. Dexamethasone 40 mg (starting dose) was given orally or intravenously weekly for each cycle for up to 6 cycles. DARA [place] was administered in a single, premixed vial and given by manual Coffey injection over the course of 3 to 5 minutes weekly in cycles 1 to 2, every 2 weeks in cycles 3 to 6, and every 4 weeks thereafter as  monotherapy for a maximum of 2 years. We would consider this continued dosing regimen if patient declined or was determined not to be a candidate for BMT.   On exam today Mr. Errington is been tolerating treatment well overall.   His hemoglobin is stable and otherwise his labs are acceptable for proceeding with treatment today.  #AL Amyloidosis Affecting the Kidney --bone marrow biopsy and kidney biopsy helped to confirm the diagnosis of AL amyloidosis. --currently being treated with  Dara-CyBorD (per the [facility] Trial), started Cycle 1 Day 1 on 09/29/2020.  --today is Cycle 3 Day 15 of treatment.  --will monitoring monthly SPEP, UPEP, and SFLC while on treatment. --will have patient return for weekly treatment with q 2 week clinic visits initially.   #Injection Site Rash, improved --appears benign on exam today  --completed antibiotic therapy  #Supportive Care --chemotherapy education performed --zofran 8m q8H PRN and compazine 173mPO q6H for nausea --acyclovir 40013mO BID for VCZ prophylaxis --albuterol HFA inhaler for daratumumab treatment -- EMLA cream for port not required, patient declines port at this time, but will reconsider once treatment starts -- no pain medication required at this time.   No orders of the defined types were placed in this encounter.   All questions were answered. The patient knows to call the clinic with any problems, questions or concerns.  A total of more than 30 minutes were spent on this encounter and over half of that time was spent on counseling and coordination of care as outlined above.   Ledell Peoples, MD Department of Hematology/Oncology [place] [facility] Phone: [phone] Pager: [phone] Email: [email or web address]  12/17/2020 4:51 PM   Literature Support:  Wendie Chess SY, Weiss BM, Fredderick Severance, Comenzo RL. Daratumumab plus CyBorD for patients with newly diagnosed AL  amyloidosis: safety run-in results of ANDROMEDA. Blood. 2020 Jul 2;136(1):71-80.  --Daratumumab-CyBorD was well tolerated, with no new safety concerns versus the intravenous formulation, and demonstrated robust hematologic and organ responses.

## 2020-12-18 ENCOUNTER — Telehealth: Payer: Self-pay | Admitting: Hematology and Oncology

## 2020-12-18 ENCOUNTER — Telehealth: Payer: Self-pay | Admitting: Licensed Clinical Social Worker

## 2020-12-18 LAB — KAPPA/LAMBDA LIGHT CHAINS
Kappa free light chain: 15.9 mg/L (ref 3.3–19.4)
Kappa, lambda light chain ratio: 0.73 (ref 0.26–1.65)
Lambda free light chains: 21.7 mg/L (ref 5.7–26.3)

## 2020-12-18 LAB — MULTIPLE MYELOMA PANEL, SERUM
Albumin SerPl Elph-Mcnc: 1.5 g/dL — ABNORMAL LOW (ref 2.9–4.4)
Albumin/Glob SerPl: 0.7 (ref 0.7–1.7)
Alpha 1: 0.2 g/dL (ref 0.0–0.4)
Alpha2 Glob SerPl Elph-Mcnc: 1.1 g/dL — ABNORMAL HIGH (ref 0.4–1.0)
B-Globulin SerPl Elph-Mcnc: 0.7 g/dL (ref 0.7–1.3)
Gamma Glob SerPl Elph-Mcnc: 0.2 g/dL — ABNORMAL LOW (ref 0.4–1.8)
Globulin, Total: 2.2 g/dL (ref 2.2–3.9)
IgA: 37 mg/dL — ABNORMAL LOW (ref 90–386)
IgG (Immunoglobin G), Serum: 176 mg/dL — ABNORMAL LOW (ref 603–1613)
IgM (Immunoglobulin M), Srm: 26 mg/dL (ref 20–172)
M Protein SerPl Elph-Mcnc: 0.1 g/dL — ABNORMAL HIGH
Total Protein ELP: 3.7 g/dL — ABNORMAL LOW (ref 6.0–8.5)

## 2020-12-18 NOTE — Telephone Encounter (Signed)
No 2/4 los. No changes made to pt's schedule.  

## 2020-12-18 NOTE — Telephone Encounter (Signed)
Call again placed to [employer] on pt behalf ([phone]) requesting Short Term Disability paperwork be faxed to our office at [phone]. Representative states she will fax over paperwork this morning attn: Katelyn who assists with Short Term Disability/FMLA paperwork.   Westley Hummer, MSW, [place]  [phone]

## 2020-12-20 ENCOUNTER — Encounter: Payer: Self-pay | Admitting: Internal Medicine

## 2020-12-20 ENCOUNTER — Telehealth: Payer: Self-pay | Admitting: Licensed Clinical Social Worker

## 2020-12-20 DIAGNOSIS — E8581 Light chain (AL) amyloidosis: Secondary | ICD-10-CM

## 2020-12-20 DIAGNOSIS — Z9181 History of falling: Secondary | ICD-10-CM

## 2020-12-20 NOTE — Telephone Encounter (Signed)
Still no paperwork received from [employer] to this office that I can find, Samuel Butler also has not received paperwork for Short Term Disability. LCSW called again and spoke with representative Ebony Hail at [phone]. She states she will try and fax paperwork again and also took my email should it need to be securely emailed.  Will check the fax machine periodically through the day to see if received.    Westley Hummer, MSW, [place]  [phone]

## 2020-12-21 NOTE — Telephone Encounter (Signed)
Message sent to Mychart

## 2020-12-22 ENCOUNTER — Other Ambulatory Visit: Payer: Self-pay

## 2020-12-22 ENCOUNTER — Inpatient Hospital Stay: Payer: BC Managed Care – PPO

## 2020-12-22 ENCOUNTER — Other Ambulatory Visit: Payer: BC Managed Care – PPO

## 2020-12-22 VITALS — BP 137/89 | HR 92 | Temp 99.0°F | Resp 18 | Wt 224.5 lb

## 2020-12-22 DIAGNOSIS — E8581 Light chain (AL) amyloidosis: Secondary | ICD-10-CM

## 2020-12-22 DIAGNOSIS — Z5112 Encounter for antineoplastic immunotherapy: Secondary | ICD-10-CM | POA: Diagnosis not present

## 2020-12-22 LAB — CMP (CANCER CENTER ONLY)
ALT: 20 U/L (ref 0–44)
AST: 18 U/L (ref 15–41)
Albumin: 1.4 g/dL — ABNORMAL LOW (ref 3.5–5.0)
Alkaline Phosphatase: 65 U/L (ref 38–126)
Anion gap: 6 (ref 5–15)
BUN: 21 mg/dL — ABNORMAL HIGH (ref 6–20)
CO2: 30 mmol/L (ref 22–32)
Calcium: 7.7 mg/dL — ABNORMAL LOW (ref 8.9–10.3)
Chloride: 108 mmol/L (ref 98–111)
Creatinine: 1.03 mg/dL (ref 0.61–1.24)
GFR, Estimated: >60 mL/min (ref >60)
Glucose, Bld: 95 mg/dL (ref 70–99)
Potassium: 3.7 mmol/L (ref 3.5–5.1)
Sodium: 144 mmol/L (ref 135–145)
Total Bilirubin: 0.4 mg/dL (ref 0.3–1.2)
Total Protein: 4.1 g/dL — ABNORMAL LOW (ref 6.5–8.1)

## 2020-12-22 LAB — CBC WITH DIFFERENTIAL (CANCER CENTER ONLY)
Abs Immature Granulocytes: 0.01 10*3/uL (ref 0.00–0.07)
Basophils Absolute: 0 10*3/uL (ref 0.0–0.1)
Basophils Relative: 0 %
Eosinophils Absolute: 0.1 10*3/uL (ref 0.0–0.5)
Eosinophils Relative: 2 %
HCT: 33 % — ABNORMAL LOW (ref 39.0–52.0)
Hemoglobin: 10.9 g/dL — ABNORMAL LOW (ref 13.0–17.0)
Immature Granulocytes: 0 %
Lymphocytes Relative: 22 %
Lymphs Abs: 1.2 10*3/uL (ref 0.7–4.0)
MCH: 31.7 pg (ref 26.0–34.0)
MCHC: 33 g/dL (ref 30.0–36.0)
MCV: 95.9 fL (ref 80.0–100.0)
Monocytes Absolute: 0.6 10*3/uL (ref 0.1–1.0)
Monocytes Relative: 10 %
Neutro Abs: 3.6 10*3/uL (ref 1.7–7.7)
Neutrophils Relative %: 66 %
Platelet Count: 144 10*3/uL — ABNORMAL LOW (ref 150–400)
RBC: 3.44 MIL/uL — ABNORMAL LOW (ref 4.22–5.81)
RDW: 17.2 % — ABNORMAL HIGH (ref 11.5–15.5)
WBC Count: 5.6 10*3/uL (ref 4.0–10.5)
nRBC: 0 % (ref 0.0–0.2)

## 2020-12-22 MED ORDER — DEXAMETHASONE 4 MG PO TABS
40.0000 mg | ORAL_TABLET | Freq: Once | ORAL | Status: AC
  Administered 2020-12-22: 40 mg via ORAL

## 2020-12-22 MED ORDER — DEXAMETHASONE 4 MG PO TABS
ORAL_TABLET | ORAL | Status: AC
  Filled 2020-12-22: qty 10

## 2020-12-22 MED ORDER — SODIUM CHLORIDE 0.9 % IV SOLN
300.0000 mg/m2 | Freq: Once | INTRAVENOUS | Status: AC
  Administered 2020-12-22: 680 mg via INTRAVENOUS
  Filled 2020-12-22: qty 34

## 2020-12-22 MED ORDER — BORTEZOMIB CHEMO SQ INJECTION 3.5 MG (2.5MG/ML)
1.3000 mg/m2 | Freq: Once | INTRAMUSCULAR | Status: AC
  Administered 2020-12-22: 3 mg via SUBCUTANEOUS
  Filled 2020-12-22: qty 1.2

## 2020-12-22 MED ORDER — SODIUM CHLORIDE 0.9 % IV SOLN
INTRAVENOUS | Status: DC
  Filled 2020-12-22: qty 250

## 2020-12-22 NOTE — Patient Instructions (Signed)
[facility] Discharge Instructions for Patients Receiving Chemotherapy  Today you received the following chemotherapy agents: bortezomib/cyclophosphamide.  To help prevent nausea and vomiting after your treatment, we encourage you to take your nausea medication as directed.   If you develop nausea and vomiting that is not controlled by your nausea medication, call the clinic.   BELOW ARE SYMPTOMS THAT SHOULD BE REPORTED IMMEDIATELY:  *FEVER GREATER THAN 100.5 F  *CHILLS WITH OR WITHOUT FEVER  NAUSEA AND VOMITING THAT IS NOT CONTROLLED WITH YOUR NAUSEA MEDICATION  *UNUSUAL SHORTNESS OF BREATH  *UNUSUAL BRUISING OR BLEEDING  TENDERNESS IN MOUTH AND THROAT WITH OR WITHOUT PRESENCE OF ULCERS  *URINARY PROBLEMS  *BOWEL PROBLEMS  UNUSUAL RASH Items with * indicate a potential emergency and should be followed up as soon as possible.  Feel free to call the clinic should you have any questions or concerns. The clinic phone number is [phone].  Please show the [place] at check-in to the Emergency Department and triage nurse.

## 2020-12-25 NOTE — Telephone Encounter (Signed)
This encounter was created in error - please disregard.

## 2020-12-26 ENCOUNTER — Other Ambulatory Visit: Payer: Self-pay

## 2020-12-26 ENCOUNTER — Encounter: Payer: Self-pay | Admitting: Physical Therapy

## 2020-12-26 ENCOUNTER — Encounter: Payer: Self-pay | Admitting: Internal Medicine

## 2020-12-26 ENCOUNTER — Telehealth: Payer: Self-pay | Admitting: Licensed Clinical Social Worker

## 2020-12-26 ENCOUNTER — Ambulatory Visit: Payer: BC Managed Care – PPO | Attending: Internal Medicine | Admitting: Physical Therapy

## 2020-12-26 ENCOUNTER — Ambulatory Visit (INDEPENDENT_AMBULATORY_CARE_PROVIDER_SITE_OTHER): Payer: BC Managed Care – PPO | Admitting: Internal Medicine

## 2020-12-26 VITALS — BP 130/88 | HR 98 | Temp 98.1°F | Wt 224.9 lb

## 2020-12-26 DIAGNOSIS — R5381 Other malaise: Secondary | ICD-10-CM | POA: Diagnosis present

## 2020-12-26 DIAGNOSIS — M6281 Muscle weakness (generalized): Secondary | ICD-10-CM | POA: Diagnosis not present

## 2020-12-26 DIAGNOSIS — I10 Essential (primary) hypertension: Secondary | ICD-10-CM

## 2020-12-26 DIAGNOSIS — B181 Chronic viral hepatitis B without delta-agent: Secondary | ICD-10-CM | POA: Diagnosis not present

## 2020-12-26 DIAGNOSIS — I2699 Other pulmonary embolism without acute cor pulmonale: Secondary | ICD-10-CM

## 2020-12-26 DIAGNOSIS — R262 Difficulty in walking, not elsewhere classified: Secondary | ICD-10-CM | POA: Diagnosis present

## 2020-12-26 DIAGNOSIS — R601 Generalized edema: Secondary | ICD-10-CM

## 2020-12-26 DIAGNOSIS — E854 Organ-limited amyloidosis: Secondary | ICD-10-CM

## 2020-12-26 DIAGNOSIS — E1151 Type 2 diabetes mellitus with diabetic peripheral angiopathy without gangrene: Secondary | ICD-10-CM

## 2020-12-26 DIAGNOSIS — K529 Noninfective gastroenteritis and colitis, unspecified: Secondary | ICD-10-CM

## 2020-12-26 LAB — POCT GLYCOSYLATED HEMOGLOBIN (HGB A1C): Hemoglobin A1C: 5.8 % — AB (ref 4.0–5.6)

## 2020-12-26 NOTE — Therapy (Signed)
[facility] [address], [place], [place], [address] Phone: [phone]   Fax:  [phone]  Physical Therapy Evaluation  Patient Details  Name: Samuel Butler MRN: [id number] Date of Birth: 08-20-1961 Referring Provider (PT): Isaac Bliss, Rayford Halsted, MD   Encounter Date: 12/26/2020   PT End of Session - 12/26/20 1743    Visit Number 1    Date for PT Re-Evaluation 03/20/21    Authorization Type BCBS    PT Start Time 1532    PT Stop Time 1614    PT Time Calculation (min) 42 min    Activity Tolerance Patient tolerated treatment well    Behavior During Therapy [facility] for tasks assessed/performed           Past Medical History:  Diagnosis Date  . DDD (degenerative disc disease), lumbar    per his report  . Diabetes mellitus without complication ([place])   . Gout 04/16/2013  . Hyperlipidemia   . Hypertension   . IBD (inflammatory bowel disease) 04/05/2016  . Pulmonary embolism ([place])   . Tobacco use disorder 03/28/2015    Past Surgical History:  Procedure Laterality Date  . ABDOMINAL AORTOGRAM W/LOWER EXTREMITY Right 06/26/2020   Procedure: ABDOMINAL AORTOGRAM W/LOWER EXTREMITY;  Surgeon: Lorretta Harp, MD;  Location: [place] [facility];  Service: Cardiovascular;  Laterality: Right;  . COLONOSCOPY    . PERIPHERAL VASCULAR INTERVENTION  06/26/2020   Procedure: PERIPHERAL VASCULAR INTERVENTION;  Surgeon: Lorretta Harp, MD;  Location: [place] [facility];  Service: Cardiovascular;;  Right SFA  . TONSILLECTOMY      There were no vitals filed for this visit.    Subjective Assessment - 12/26/20 1541    Subjective Pt presents to PT due to being released to return to work and wants to work on being able to due very demanding job related duties    Pertinent History receiving chemo weekly for AL amyloidosis cancer; has had stents in LE due to blockages    Limitations Standing;Lifting;Walking;House hold activities     How long can you walk comfortably? 5-10 minutes and then I feel SOB and pain    Patient Stated Goals be able to get up and down large steps and off the floor; be less short of breath    Currently in Pain? Yes    Pain Score 8     Pain Location Leg    Pain Orientation Right;Left    Pain Descriptors / Indicators Tightness;Aching    Pain Type Chronic pain    Pain Radiating Towards swelling down both legs    Pain Onset More than a month ago    Pain Frequency Intermittent    Aggravating Factors  sitting up    Pain Relieving Factors lying down - better in the morning    Effect of Pain on Daily Activities on disability currently    Multiple Pain Sites No              [facility] PT Assessment - 12/26/20 0001      Assessment   Medical Diagnosis Z91.81 (ICD-10-CM) - Risk for falls; E85.81 (ICD-10-CM) - Light chain (AL) amyloidosis [facility]    Referring Provider (PT) Isaac Bliss, Rayford Halsted, MD    Onset Date/Surgical Date --   6 months ago   Prior Therapy No      Precautions   Precautions None      Balance Screen   Has the patient fallen in the past 6  months No      Home Ecologist residence    Living Arrangements Spouse/significant other    Additional Comments No stairs in home      Prior Function   Level of Independence Independent    Vocation On disability    Vocation Requirements walking, carry 50 lb, push/pull 75, kneel, stoop, squat, overhead reaching, up and down ladders, fork lift driving      Cognition   Overall Cognitive Status Within Functional Limits for tasks assessed      Functional Tests   Functional tests Squat;Step up;Step down;Single leg stance      Squat   Comments excess hip flexion hands on thigs can do 1 rep quarter squat      Step Up   Comments needs one hand rail, lateral trunk lean fatigue stops to catch breath after up 3 steps      Step Down   Comments Rt knee buckling coming down      Single Leg Stance   Comments  Lt 2 sec; Rt 10 sec very shaky      Posture/Postural Control   Posture/Postural Control Postural limitations    Postural Limitations Anterior pelvic tilt;Flexed trunk      ROM / Strength   AROM / PROM / Strength Strength      Strength   Overall Strength Comments MMT LE 5/5 bilateral      Flexibility   Soft Tissue Assessment /Muscle Length no      Transfers   Five time sit to stand comments  32 sec; need heavy UE support on thighs 2 reps then on arms of chair      Ambulation/Gait   Gait Pattern Decreased stride length;Decreased weight shift to left;Trunk flexed                      Objective measurements completed on examination: See above findings.       [facility] Adult PT Treatment/Exercise - 12/26/20 0001      Self-Care   Self-Care Other Self-Care Comments    Other Self-Care Comments  initial HEP educated and performed                  PT Education - 12/26/20 1802    Education Details Access Code: [email or web address]    Person(s) Educated Patient    Methods Explanation;Demonstration;Verbal cues;Handout    Comprehension Verbalized understanding;Returned demonstration            PT Short Term Goals - 12/26/20 1557      PT SHORT TERM GOAL #1   Title 5 x sit to stand in 20 seconds or less    Baseline 32 seconds    Time 6    Period Weeks    Status New    Target Date 02/06/21      PT SHORT TERM GOAL #2   Title Pt will be able to go up and down 4 steps reciprocally 3 x using one handrail due to improved strength    Time 6    Period Weeks    Status New    Target Date 02/06/21      PT SHORT TERM GOAL #3   Title Pt will perform 6 min walk test for baseline to create long term goal    Time 1    Period Weeks    Status New    Target Date 01/02/21  PT Long Term Goals - 12/26/20 1544      PT LONG TERM GOAL #1   Title Be able to sit up such as with driving for at least 3 hours at a time with 60% less pain for 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    Baseline 8/10 after 30 min of driving or sitting    Time 12    Period Weeks    Status New    Target Date 03/20/21      PT LONG TERM GOAL #2   Title Be able to get up and down off the floor without assistance    Baseline moderate assistance and UE support    Time 12    Period Weeks    Status New    Target Date 03/20/21      PT LONG TERM GOAL #3   Title Pt will be able to walk for at least 30 minutes without increased gait deviations or pain for return to work related tasks    Baseline 5-10 minutes then gets short of breath and pain    Time 12    Period Weeks    Status New    Target Date 03/20/21      PT LONG TERM GOAL #4   Title Pt will be able to perform single leg stand for at least10 seconds for reduced risk of falls    Time 12    Period Weeks    Status New    Target Date 03/20/21      PT LONG TERM GOAL #5   Title Pt will be able to lift at least 25lb from mat table with good mechanics and carry 20 ft for return to work activities.    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  His diagnosis has made him unable to participate in his job for the last 6 months.  Pt may be able to return to work, but he is deconditioned at this time.  Pt has increased risk of falls based on single leg stand and 5 x sit to stand tests. Pt has abnormal gait and he gets very short of breath with functional tests.  pt has physically demanding profession and it includes getting up and down from the floor, lifting up to 50lb, pushing 75lb and getting in and out of lift with large steps to enter.  Pt gets out of breath and Rt knee is buckling after going up and down 4 steps in the gym.  Pt has 5x sit to stand in 32 seconds and needs heavy UE assistance after 2 reps.  Pt has  posture and gait abnormalities as mentioned. Pt will benefit from skilled PT to address impairments and improve function for safe return to work.    Examination-Activity Limitations Lift;Transfers;Sit;Squat    Examination-Participation Restrictions Occupation;Community Activity;Driving    Stability/Clinical Decision Making Evolving/Moderate complexity    Clinical Decision Making Moderate    Rehab Potential Good    PT Frequency 2x / week    PT Duration 12 weeks    PT Treatment/Interventions ADLs/Self Care Home Management;Cryotherapy;Electrical Stimulation;Moist Heat;Traction;Gait Scientist, forensic;Therapeutic activities;Therapeutic exercise;Balance training;Neuromuscular re-education;Patient/family education;Manual techniques;Taping  PT Next Visit Plan 6 min walk test, balance, strength, squatting, add to HEP as needed    Consulted and Agree with Plan of Care Patient           Patient will benefit from skilled therapeutic intervention in order to improve the following deficits and impairments:  Pain,Postural dysfunction,Decreased strength,Difficulty walking,Decreased balance,Abnormal gait,Decreased endurance  Visit Diagnosis: Muscle weakness (generalized)  Difficulty in walking, not elsewhere classified  Physical deconditioning     Problem List Patient Active Problem List   Diagnosis Date Noted  . Acute pulmonary embolism ([place]) 10/18/2020  . Groin pain, right 08/31/2020  . Chronic viral hepatitis B without delta-agent ([place]) 08/21/2020  . Amyloidosis ([place]) 08/21/2020  . Anasarca 08/04/2020  . Nephrotic syndrome 08/03/2020  . AKI (acute kidney injury) ([place]) 08/03/2020  . Hypokalemia 06/27/2020  . Claudication in peripheral vascular disease ([place]) 06/26/2020  . Hypertension associated with diabetes ([place]) 05/25/2020  . Peripheral arterial disease ([place]) 03/08/2020  . Vitamin D deficiency 10/26/2019  . Hypoalbuminemia 10/26/2019  . IBD (inflammatory bowel disease)  04/05/2016  . Tobacco use disorder 03/28/2015  . Non-insulin treated type 2 diabetes mellitus ([place]) 01/25/2014  . Essential hypertension, benign 04/16/2013  . Hyperlipemia 04/16/2013  . Gout 04/16/2013    Jule Ser, PT 12/26/2020, 6:12 PM  [facility] [address], [place], [place], [address] Phone: [phone]   Fax:  [phone]  Name: Samuel Butler MRN: [id number] Date of Birth: 09-13-1961

## 2020-12-26 NOTE — Telephone Encounter (Signed)
Confirmed w/ Marolyn Hammock that Short Term Disability paperwork has finally been received by this office. She will work on completion as soon as time allows. I also have notified pharmacist since pt has appointment w/ their team this week.    Westley Hummer, MSW, [place]  [phone]

## 2020-12-26 NOTE — Telephone Encounter (Signed)
Called [employer] again on pt behalf.  Spoke with Danae Chen at [phone]; let her know we still have yet to receive the paperwork to the fax number/email previously provided. Provided alternate fax number this morning [phone]). Katelyn, who does our disability/FMLA paperwork at [facility] office also aware.   Westley Hummer, MSW, [place]  [phone]

## 2020-12-26 NOTE — Patient Instructions (Signed)
Access Code: [email or web address] URL: [email or web address] Date: 12/26/2020 Prepared by: Jari Favre  Exercises Sit to Stand with Hands on Knees - 4 x daily - 7 x weekly - 1 sets - 3 reps

## 2020-12-26 NOTE — Progress Notes (Signed)
Established Patient Office Visit     This visit occurred during the SARS-CoV-2 public health emergency.  Safety protocols were in place, including screening questions prior to the visit, additional usage of staff PPE, and extensive cleaning of exam room while observing appropriate contact time as indicated for disinfecting solutions.    CC/Reason for Visit: Follow-up chronic medical conditions  HPI: Samuel Butler is a 60 y.o. male who is coming in today for the above mentioned reasons. Past Medical History is significant for: Hypertension, hyperlipidemia, inflammatory bowel disease, well-controlled type 2 diabetes, peripheral artery disease status post stenting of his right SFA by Dr. Gwenlyn Found.  He has been diagnosed with amyloidosis and is undergoing care by oncology, Dr. Lorenso Courier.  Unfortunately he was hospitalized from December 8 through December 12 due to an acute pulmonary embolism.  She is now anticoagulated on Eliquis for at least a year.  He also has chronic hepatitis B and is under the care of ID, Dr. Juleen China.  He continues to have issues with swollen legs which significantly impair his ability to work.  He is due to have his A1c rechecked today.  He is requesting a referral to podiatry for nail care.   Past Medical/Surgical History: Past Medical History:  Diagnosis Date  . DDD (degenerative disc disease), lumbar    per his report  . Diabetes mellitus without complication ([place])   . Gout 04/16/2013  . Hyperlipidemia   . Hypertension   . IBD (inflammatory bowel disease) 04/05/2016  . Pulmonary embolism ([place])   . Tobacco use disorder 03/28/2015    Past Surgical History:  Procedure Laterality Date  . ABDOMINAL AORTOGRAM W/LOWER EXTREMITY Right 06/26/2020   Procedure: ABDOMINAL AORTOGRAM W/LOWER EXTREMITY;  Surgeon: Lorretta Harp, MD;  Location: [place] [facility];  Service: Cardiovascular;  Laterality: Right;  . COLONOSCOPY    . PERIPHERAL VASCULAR INTERVENTION  06/26/2020    Procedure: PERIPHERAL VASCULAR INTERVENTION;  Surgeon: Lorretta Harp, MD;  Location: [place] [facility];  Service: Cardiovascular;;  Right SFA  . TONSILLECTOMY      Social History:  reports that he quit smoking about 4 years ago. His smoking use included cigarettes. He has never used smokeless tobacco. He reports previous alcohol use. He reports that he does not use drugs.  Allergies: Allergies  Allergen Reactions  . Crestor [Rosuvastatin Calcium] Other (See Comments)    Joint aches    Family History:  Family History  Problem Relation Age of Onset  . Hypertension Father   . Diabetes Father   . Stroke Father   . Colon cancer Brother      Current Outpatient Medications:  .  acetaminophen (TYLENOL) 650 MG CR tablet, Take 1,300 mg by mouth every 8 (eight) hours as needed for pain. , Disp: , Rfl:  .  acyclovir (ZOVIRAX) 400 MG tablet, Take 1 tablet (400 mg total) by mouth 2 (two) times daily., Disp: 60 tablet, Rfl: 3 .  albuterol (VENTOLIN HFA) 108 (90 Base) MCG/ACT inhaler, Inhale 2 puffs into the lungs every 6 (six) hours as needed for wheezing or shortness of breath., Disp: 8 g, Rfl: 0 .  apixaban (ELIQUIS) 5 MG TABS tablet, Take 1 tablet (5 mg total) by mouth 2 (two) times daily., Disp: 180 tablet, Rfl: 1 .  atorvastatin (LIPITOR) 80 MG tablet, Take 1 tablet (80 mg total) by mouth daily., Disp: 90 tablet, Rfl: 2 .  clopidogrel (PLAVIX) 75 MG tablet, Take 1 tablet (75 mg total)  by mouth daily with breakfast., Disp: 90 tablet, Rfl: 3 .  dicyclomine (BENTYL) 10 MG capsule, Take 1 capsule (10 mg total) by mouth 4 (four) times daily. Take 4 times daily before meals and at bedtime, Disp: 90 capsule, Rfl: 0 .  ezetimibe (ZETIA) 10 MG tablet, Take 1 tablet (10 mg total) by mouth daily., Disp: 90 tablet, Rfl: 3 .  isosorbide mononitrate (IMDUR) 30 MG 24 hr tablet, Take 1 tablet (30 mg total) by mouth daily., Disp: , Rfl:  .  losartan (COZAAR) 50 MG tablet, Take 1 tablet (50 mg total) by  mouth daily., Disp: 90 tablet, Rfl: 3 .  metFORMIN (GLUCOPHAGE) 500 MG tablet, Take 1 tablet by mouth once daily with breakfast, Disp: 90 tablet, Rfl: 1 .  ondansetron (ZOFRAN) 8 MG tablet, TAKE 1 TABLET BY MOUTH EVERY 8 HOURS AS NEEDED FOR NAUSEA OR VOMITING, Disp: 30 tablet, Rfl: 0 .  pantoprazole (PROTONIX) 40 MG tablet, Take 1 tablet (40 mg total) by mouth daily. (Patient taking differently: Take 40 mg by mouth daily before breakfast.), Disp: 90 tablet, Rfl: 1 .  potassium chloride SA (KLOR-CON) 20 MEQ tablet, Take 2 tablets (40 mEq total) by mouth daily. 80MG  x3 days then back to 40 mg/day, Disp: 33 tablet, Rfl: 6 .  torsemide (DEMADEX) 20 MG tablet, Take 1 tablet (20 mg total) by mouth 2 (two) times daily. 40mg  BID x 3 days then back to 20mg  BID, Disp: 33 tablet, Rfl: 6 .  VEMLIDY 25 MG TABS, Take 1 tablet (25 mg total) by mouth daily., Disp: 30 tablet, Rfl: 5 .  Vitamin D, Ergocalciferol, (DRISDOL) 1.25 MG (50000 UNIT) CAPS capsule, Take 1 capsule by mouth once a week, Disp: 12 capsule, Rfl: 0  Review of Systems:  Constitutional: Denies fever, chills, diaphoresis, appetite change and fatigue.  HEENT: Denies photophobia, eye pain, redness, hearing loss, ear pain, congestion, sore throat, rhinorrhea, sneezing, mouth sores, trouble swallowing, neck pain, neck stiffness and tinnitus.   Respiratory: Denies SOB, DOE, cough, chest tightness,  and wheezing.   Cardiovascular: Denies chest pain, palpitations. Gastrointestinal: Denies nausea, vomiting, abdominal pain, diarrhea, constipation, blood in stool and abdominal distention.  Genitourinary: Denies dysuria, urgency, frequency, hematuria, flank pain and difficulty urinating.  Endocrine: Denies: hot or cold intolerance, sweats, changes in hair or nails, polyuria, polydipsia. Musculoskeletal: Denies myalgias,  joint swelling, arthralgias and gait problem.  Skin: Denies pallor, rash and wound.  Neurological: Denies dizziness, seizures, syncope,  weakness, light-headedness, numbness and headaches.  Hematological: Denies adenopathy. Easy bruising, personal or family bleeding history  Psychiatric/Behavioral: Denies suicidal ideation, mood changes, confusion, nervousness, sleep disturbance and agitation    Physical Exam: Vitals:   12/26/20 1433  BP: 130/88  Pulse: 98  Temp: 98.1 F (36.7 C)  TempSrc: Oral  SpO2: 97%  Weight: 224 lb 14.4 oz (102 kg)    Body mass index is 30.93 kg/m.   Constitutional: NAD, calm, comfortable Eyes: PERRL, lids and conjunctivae normal ENMT: Mucous membranes are moist.  Respiratory: clear to auscultation bilaterally, no wheezing, no crackles. Normal respiratory effort. No accessory muscle use.  Cardiovascular: Regular rate and rhythm, no murmurs / rubs / gallops.  4+ pitting bilateral lower extremity edema up to mid thighs. Neurologic: Grossly intact and nonfocal Psychiatric: Normal judgment and insight. Alert and oriented x 3. Normal mood.    Impression and Plan:  Type 2 diabetes mellitus with diabetic peripheral angiopathy without gangrene, without long-term current use of insulin (HCC)  -Well-controlled with an A1c of  5.8 today. -I will refer to podiatry for routine diabetic foot care and toenail trimming.  Acute pulmonary embolism, unspecified pulmonary embolism type, unspecified whether acute cor pulmonale present (HCC) -On Eliquis.  Chronic viral hepatitis B without delta agent and without coma (HCC) -Followed by ID  Organ-limited amyloidosis (HCC) -Currently under the care of oncology.  Anasarca -Due to a combination of hypoproteinemia, chronic heart failure, venous insufficiency. -He continues on torsemide, followed by cardiology. -Have advised compression stocking use.  Essential hypertension, benign -Well-controlled.  IBD (inflammatory bowel disease) -Has seen GI.  They wanted to do EGD and colonoscopy but currently are not able due to his recent PE and anticoagulation  status.   Patient Instructions  -Nice seeing you today!!  -Schedule follow up in 3-4 months.     Lelon Frohlich, MD [facility]

## 2020-12-26 NOTE — Patient Instructions (Signed)
-  Nice seeing you today!!  -Schedule follow up in 3-4 months. 

## 2020-12-27 ENCOUNTER — Other Ambulatory Visit: Payer: Self-pay | Admitting: Hematology and Oncology

## 2020-12-27 DIAGNOSIS — E8581 Light chain (AL) amyloidosis: Secondary | ICD-10-CM

## 2020-12-28 ENCOUNTER — Other Ambulatory Visit: Payer: Self-pay

## 2020-12-28 ENCOUNTER — Ambulatory Visit (INDEPENDENT_AMBULATORY_CARE_PROVIDER_SITE_OTHER): Payer: BC Managed Care – PPO | Admitting: Pharmacist

## 2020-12-28 VITALS — BP 140/102 | HR 72 | Resp 16 | Ht 71.0 in | Wt 223.8 lb

## 2020-12-28 DIAGNOSIS — R079 Chest pain, unspecified: Secondary | ICD-10-CM

## 2020-12-28 DIAGNOSIS — I10 Essential (primary) hypertension: Secondary | ICD-10-CM

## 2020-12-28 DIAGNOSIS — Z79899 Other long term (current) drug therapy: Secondary | ICD-10-CM

## 2020-12-28 DIAGNOSIS — E785 Hyperlipidemia, unspecified: Secondary | ICD-10-CM

## 2020-12-28 DIAGNOSIS — I739 Peripheral vascular disease, unspecified: Secondary | ICD-10-CM

## 2020-12-28 MED ORDER — REPATHA SURECLICK 140 MG/ML ~~LOC~~ SOAJ: 140.0000 mg | SUBCUTANEOUS | 12 refills | Status: AC

## 2020-12-28 MED ORDER — ISOSORBIDE MONONITRATE ER 30 MG PO TB24: 30.0000 mg | ORAL_TABLET | Freq: Every day | ORAL | 3 refills | Status: DC

## 2020-12-28 NOTE — Patient Instructions (Addendum)
Your Results:             Your most recent labs Goal  Total Cholesterol 303 < 200  Triglycerides 158 < 150  HDL (good cholesterol) 49.8 > 40  LDL (bad cholesterol) 222 < 70      Medication changes: *START Repatha SureClick 140mg  every 14 days (1st dose done in office) *Continue all other medication as prescribed  Lab orders: *Repeat fasting blood work after 4th Repatha dose  Clinic phone number: Mandisa Persinger/Kristin/[place] [phone]  Thank you for choosing [facility]

## 2020-12-28 NOTE — Progress Notes (Signed)
Patient ID: Samuel Butler                 DOB: [birth year]                    MRN: [id number]     HPI: Samuel Butler is a 60 y.o. male patient of Dr Gwenlyn Found referred to lipid clinic by Sheela Stack NP. PMH is significant for DM, gout, hyperlipidemia, hypertension, PE, PAD s/p stent placement, hepatitis, and vitamin D deficiency. Patent still working on short term disability issues, but willing to start injectable medication for cholesterol management. Noted intolerance to atorvastatin 80mg  , rosuvastatin 20mg , and rosuvastatin 10mg . Patient is currently on ezetimibe therapy and compliance with medication.  Current Medications: Ezetimibe 10mg  daily   Intolerances:  atorvastatin 80mg  rosuvastatin 20mg  Rosuvastatin 10mg   LDL goal: 70mg /dL  Diet: balanced (fair)  Exercise: limited d/t leg pain  Family History: HTN, DM, and stroke in father. Colon cancel in brother  Social History: former smoker, occasional alcohol use  Labs: 11/16/20: CHO 303, TG 158, HDL 49, LDL-c 222 on ezetimibe 10mg   Past Medical History:  Diagnosis Date  . DDD (degenerative disc disease), lumbar    per his report  . Diabetes mellitus without complication ([place])   . Gout 04/16/2013  . Hyperlipidemia   . Hypertension   . IBD (inflammatory bowel disease) 04/05/2016  . Pulmonary embolism ([place])   . Tobacco use disorder 03/28/2015    Current Outpatient Medications on File Prior to Visit  Medication Sig Dispense Refill  . acetaminophen (TYLENOL) 650 MG CR tablet Take 1,300 mg by mouth every 8 (eight) hours as needed for pain.     Marland Kitchen acyclovir (ZOVIRAX) 400 MG tablet Take 1 tablet (400 mg total) by mouth 2 (two) times daily. 60 tablet 3  . apixaban (ELIQUIS) 5 MG TABS tablet Take 1 tablet (5 mg total) by mouth 2 (two) times daily. 180 tablet 1  . atorvastatin (LIPITOR) 80 MG tablet Take 1 tablet (80 mg total) by mouth daily. 90 tablet 2  . clopidogrel (PLAVIX) 75 MG tablet Take 1 tablet (75 mg total) by mouth daily  with breakfast. 90 tablet 3  . ezetimibe (ZETIA) 10 MG tablet Take 1 tablet (10 mg total) by mouth daily. 90 tablet 3  . losartan (COZAAR) 50 MG tablet Take 1 tablet (50 mg total) by mouth daily. 90 tablet 3  . metFORMIN (GLUCOPHAGE) 500 MG tablet Take 1 tablet by mouth once daily with breakfast 90 tablet 1  . ondansetron (ZOFRAN) 8 MG tablet TAKE 1 TABLET BY MOUTH EVERY 8 HOURS AS NEEDED FOR NAUSEA OR VOMITING 30 tablet 0  . pantoprazole (PROTONIX) 40 MG tablet Take 1 tablet (40 mg total) by mouth daily. (Patient taking differently: Take 40 mg by mouth daily before breakfast.) 90 tablet 1  . potassium chloride SA (KLOR-CON) 20 MEQ tablet Take 2 tablets (40 mEq total) by mouth daily. 80MG  x3 days then back to 40 mg/day 33 tablet 6  . torsemide (DEMADEX) 20 MG tablet Take 1 tablet (20 mg total) by mouth 2 (two) times daily. 40mg  BID x 3 days then back to 20mg  BID 33 tablet 6  . VEMLIDY 25 MG TABS Take 1 tablet (25 mg total) by mouth daily. 30 tablet 5  . Vitamin D, Ergocalciferol, (DRISDOL) 1.25 MG (50000 UNIT) CAPS capsule Take 1 capsule by mouth once a week 12 capsule 0  . albuterol (VENTOLIN HFA) 108 (90 Base) MCG/ACT inhaler Inhale 2  puffs into the lungs every 6 (six) hours as needed for wheezing or shortness of breath. 8 g 0  . dicyclomine (BENTYL) 10 MG capsule Take 1 capsule (10 mg total) by mouth 4 (four) times daily. Take 4 times daily before meals and at bedtime 90 capsule 0   No current facility-administered medications on file prior to visit.    Allergies  Allergen Reactions  . Crestor [Rosuvastatin Calcium] Other (See Comments)    Joint aches    Hyperlipemia LDL remains above goal for secondary prevention. Patient unable to tolerate high intensity statins, but continues to take ezetimibe 10mg  daily. We discussed storage, administration, common side effects, prior-authorization process, and monitoring of PCSK9i. Insurance prefer product is Repatha.   Will start Repatha SureClick  140mg  every 14 days, continue ezetimibe 10mg  daily, and repeat fasting blood work in 2 months.  Essential hypertension, benign Blood pressure is elevated at this time. Patient very upset while rooming. Will continue to monitor and reassess on follow up.   Royston Bekele Rodriguez-Guzman PharmD, BCPS, [place] [address] 01/02/2021 5:00 PM

## 2020-12-29 ENCOUNTER — Inpatient Hospital Stay: Payer: BC Managed Care – PPO

## 2020-12-29 ENCOUNTER — Other Ambulatory Visit: Payer: Self-pay | Admitting: Internal Medicine

## 2020-12-29 ENCOUNTER — Other Ambulatory Visit: Payer: Self-pay

## 2020-12-29 VITALS — BP 135/99 | HR 78 | Temp 98.9°F | Resp 18 | Wt 223.5 lb

## 2020-12-29 DIAGNOSIS — E8581 Light chain (AL) amyloidosis: Secondary | ICD-10-CM

## 2020-12-29 DIAGNOSIS — Z5112 Encounter for antineoplastic immunotherapy: Secondary | ICD-10-CM | POA: Diagnosis not present

## 2020-12-29 DIAGNOSIS — R079 Chest pain, unspecified: Secondary | ICD-10-CM

## 2020-12-29 LAB — CBC WITH DIFFERENTIAL (CANCER CENTER ONLY)
Abs Immature Granulocytes: 0.01 10*3/uL (ref 0.00–0.07)
Basophils Absolute: 0 10*3/uL (ref 0.0–0.1)
Basophils Relative: 0 %
Eosinophils Absolute: 0.2 10*3/uL (ref 0.0–0.5)
Eosinophils Relative: 4 %
HCT: 35.2 % — ABNORMAL LOW (ref 39.0–52.0)
Hemoglobin: 11.6 g/dL — ABNORMAL LOW (ref 13.0–17.0)
Immature Granulocytes: 0 %
Lymphocytes Relative: 29 %
Lymphs Abs: 1.4 10*3/uL (ref 0.7–4.0)
MCH: 31.8 pg (ref 26.0–34.0)
MCHC: 33 g/dL (ref 30.0–36.0)
MCV: 96.4 fL (ref 80.0–100.0)
Monocytes Absolute: 0.6 10*3/uL (ref 0.1–1.0)
Monocytes Relative: 11 %
Neutro Abs: 2.7 10*3/uL (ref 1.7–7.7)
Neutrophils Relative %: 56 %
Platelet Count: 128 10*3/uL — ABNORMAL LOW (ref 150–400)
RBC: 3.65 MIL/uL — ABNORMAL LOW (ref 4.22–5.81)
RDW: 17.2 % — ABNORMAL HIGH (ref 11.5–15.5)
WBC Count: 4.8 10*3/uL (ref 4.0–10.5)
nRBC: 0 % (ref 0.0–0.2)

## 2020-12-29 LAB — CMP (CANCER CENTER ONLY)
ALT: 19 U/L (ref 0–44)
AST: 16 U/L (ref 15–41)
Albumin: 1.4 g/dL — ABNORMAL LOW (ref 3.5–5.0)
Alkaline Phosphatase: 67 U/L (ref 38–126)
Anion gap: 8 (ref 5–15)
BUN: 18 mg/dL (ref 8–23)
CO2: 27 mmol/L (ref 22–32)
Calcium: 7.6 mg/dL — ABNORMAL LOW (ref 8.9–10.3)
Chloride: 109 mmol/L (ref 98–111)
Creatinine: 0.98 mg/dL (ref 0.61–1.24)
GFR, Estimated: >60 mL/min (ref >60)
Glucose, Bld: 91 mg/dL (ref 70–99)
Potassium: 4 mmol/L (ref 3.5–5.1)
Sodium: 144 mmol/L (ref 135–145)
Total Bilirubin: 0.4 mg/dL (ref 0.3–1.2)
Total Protein: 4 g/dL — ABNORMAL LOW (ref 6.5–8.1)

## 2020-12-29 LAB — LACTATE DEHYDROGENASE: LDH: 253 U/L — ABNORMAL HIGH (ref 98–192)

## 2020-12-29 MED ORDER — ACETAMINOPHEN 325 MG PO TABS
650.0000 mg | ORAL_TABLET | Freq: Once | ORAL | Status: AC
  Administered 2020-12-29: 650 mg via ORAL

## 2020-12-29 MED ORDER — DEXAMETHASONE 4 MG PO TABS
ORAL_TABLET | ORAL | Status: AC
  Filled 2020-12-29: qty 10

## 2020-12-29 MED ORDER — DARATUMUMAB-HYALURONIDASE-FIHJ 1800-30000 MG-UT/15ML ~~LOC~~ SOLN
1800.0000 mg | Freq: Once | SUBCUTANEOUS | Status: AC
  Administered 2020-12-29: 1800 mg via SUBCUTANEOUS
  Filled 2020-12-29: qty 15

## 2020-12-29 MED ORDER — DEXAMETHASONE 4 MG PO TABS
40.0000 mg | ORAL_TABLET | Freq: Once | ORAL | Status: AC
  Administered 2020-12-29: 40 mg via ORAL

## 2020-12-29 MED ORDER — DIPHENHYDRAMINE HCL 25 MG PO CAPS
ORAL_CAPSULE | ORAL | Status: AC
  Filled 2020-12-29: qty 2

## 2020-12-29 MED ORDER — SODIUM CHLORIDE 0.9 % IV SOLN
300.0000 mg/m2 | Freq: Once | INTRAVENOUS | Status: AC
  Administered 2020-12-29: 680 mg via INTRAVENOUS
  Filled 2020-12-29: qty 34

## 2020-12-29 MED ORDER — DIPHENHYDRAMINE HCL 25 MG PO CAPS
50.0000 mg | ORAL_CAPSULE | Freq: Once | ORAL | Status: AC
  Administered 2020-12-29: 50 mg via ORAL

## 2020-12-29 MED ORDER — DEXAMETHASONE 4 MG PO TABS
ORAL_TABLET | ORAL | Status: AC
  Filled 2020-12-29: qty 1

## 2020-12-29 MED ORDER — SODIUM CHLORIDE 0.9 % IV SOLN
INTRAVENOUS | Status: DC
  Filled 2020-12-29: qty 250

## 2020-12-29 MED ORDER — ACETAMINOPHEN 325 MG PO TABS
ORAL_TABLET | ORAL | Status: AC
  Filled 2020-12-29: qty 2

## 2020-12-29 MED ORDER — BORTEZOMIB CHEMO SQ INJECTION 3.5 MG (2.5MG/ML)
1.3000 mg/m2 | Freq: Once | INTRAMUSCULAR | Status: AC
  Administered 2020-12-29: 3 mg via SUBCUTANEOUS
  Filled 2020-12-29: qty 1.2

## 2020-12-29 NOTE — Patient Instructions (Signed)
[facility] Discharge Instructions for Patients Receiving Chemotherapy  Today you received the following chemotherapy agents cytoxan, velcade, darzalex.  To help prevent nausea and vomiting after your treatment, we encourage you to take your nausea medication as directed.    If you develop nausea and vomiting that is not controlled by your nausea medication, call the clinic.   BELOW ARE SYMPTOMS THAT SHOULD BE REPORTED IMMEDIATELY:  *FEVER GREATER THAN 100.5 F  *CHILLS WITH OR WITHOUT FEVER  NAUSEA AND VOMITING THAT IS NOT CONTROLLED WITH YOUR NAUSEA MEDICATION  *UNUSUAL SHORTNESS OF BREATH  *UNUSUAL BRUISING OR BLEEDING  TENDERNESS IN MOUTH AND THROAT WITH OR WITHOUT PRESENCE OF ULCERS  *URINARY PROBLEMS  *BOWEL PROBLEMS  UNUSUAL RASH Items with * indicate a potential emergency and should be followed up as soon as possible.  Feel free to call the clinic should you have any questions or concerns. The clinic phone number is [phone].  Please show the [facility] at check-in to the Emergency Department and triage nurse.

## 2021-01-02 ENCOUNTER — Telehealth: Payer: Self-pay

## 2021-01-02 ENCOUNTER — Other Ambulatory Visit: Payer: Self-pay

## 2021-01-02 ENCOUNTER — Ambulatory Visit: Payer: BC Managed Care – PPO | Admitting: Physical Therapy

## 2021-01-02 ENCOUNTER — Telehealth: Payer: Self-pay | Admitting: Cardiovascular Disease

## 2021-01-02 ENCOUNTER — Encounter: Payer: Self-pay | Admitting: Pharmacist

## 2021-01-02 ENCOUNTER — Encounter: Payer: Self-pay | Admitting: Physical Therapy

## 2021-01-02 DIAGNOSIS — M6281 Muscle weakness (generalized): Secondary | ICD-10-CM

## 2021-01-02 DIAGNOSIS — R5381 Other malaise: Secondary | ICD-10-CM

## 2021-01-02 DIAGNOSIS — R262 Difficulty in walking, not elsewhere classified: Secondary | ICD-10-CM

## 2021-01-02 NOTE — Telephone Encounter (Signed)
FMLA / disability paperwork has been completed and faxed on 2/22.

## 2021-01-02 NOTE — Patient Instructions (Signed)
Access Code: [email or web address] URL: [email or web address] Date: 01/02/2021 Prepared by: Jari Favre  Exercises Sit to Stand with Hands on Knees - 4 x daily - 7 x weekly - 1 sets - 3 reps Clamshell - 1 x daily - 7 x weekly - 3 sets - 10 reps Hooklying Clamshells with Resistance - 1 x daily - 7 x weekly - 3 sets - 10 reps Supine Active Straight Leg Raise - 1 x daily - 7 x weekly - 3 sets - 10 reps Mini Squat - 1 x daily - 7 x weekly - 3 sets - 10 reps Standing Heel Raise - 1 x daily - 7 x weekly - 3 sets - 10 reps

## 2021-01-02 NOTE — Assessment & Plan Note (Signed)
LDL remains above goal for secondary prevention. Patient unable to tolerate high intensity statins, but continues to take ezetimibe 10mg  daily. We discussed storage, administration, common side effects, prior-authorization process, and monitoring of PCSK9i. Insurance prefer product is Repatha.   Will start Repatha SureClick 140mg  every 14 days, continue ezetimibe 10mg  daily, and repeat fasting blood work in 2 months.

## 2021-01-02 NOTE — Therapy (Signed)
[facility] [address], [place], [place], [address] Phone: [phone]   Fax:  [phone]  Physical Therapy Treatment  Patient Details  Name: Samuel Butler MRN: [id number] Date of Birth: 24-Jul-1961 Referring Provider (PT): Isaac Bliss, Rayford Halsted, MD   Encounter Date: 01/02/2021   PT End of Session - 01/02/21 1246    Visit Number 2    Date for PT Re-Evaluation 03/20/21    PT Start Time 1233    PT Stop Time 1313    PT Time Calculation (min) 40 min    Activity Tolerance Patient tolerated treatment well    Behavior During Therapy [facility] for tasks assessed/performed           Past Medical History:  Diagnosis Date  . DDD (degenerative disc disease), lumbar    per his report  . Diabetes mellitus without complication ([place])   . Gout 04/16/2013  . Hyperlipidemia   . Hypertension   . IBD (inflammatory bowel disease) 04/05/2016  . Pulmonary embolism ([place])   . Tobacco use disorder 03/28/2015    Past Surgical History:  Procedure Laterality Date  . ABDOMINAL AORTOGRAM W/LOWER EXTREMITY Right 06/26/2020   Procedure: ABDOMINAL AORTOGRAM W/LOWER EXTREMITY;  Surgeon: Lorretta Harp, MD;  Location: [facility];  Service: Cardiovascular;  Laterality: Right;  . COLONOSCOPY    . PERIPHERAL VASCULAR INTERVENTION  06/26/2020   Procedure: PERIPHERAL VASCULAR INTERVENTION;  Surgeon: Lorretta Harp, MD;  Location: [facility];  Service: Cardiovascular;;  Right SFA  . TONSILLECTOMY      There were no vitals filed for this visit.   Subjective Assessment - 01/02/21 1243    Subjective Pt reports no pain coming to PT.  After 6 min walk test states his low back and right knee started hurting    Pertinent History receiving chemo weekly for AL amyloidosis cancer; has had stents in LE due to blockages    Limitations Standing;Lifting;Walking;House hold activities    Patient Stated Goals be able to get up and down  large steps and off the floor; be less short of breath    Currently in Pain? No/denies              [facility] PT Assessment - 01/02/21 0001      6 minute walk test results    Aerobic Endurance Distance Walked 1183                         [facility] PT Treatment/Exercise - 01/02/21 0001      Exercises   Exercises Knee/Hip      Knee/Hip Exercises: Stretches   Active Hamstring Stretch Right;Left;2 reps;30 seconds    Piriformis Stretch Left;Right;2 reps;20 seconds      Knee/Hip Exercises: Aerobic   Nustep L1 x 10 min for endurance training      Knee/Hip Exercises: Standing   Heel Raises Both;20 reps    Other Standing Knee Exercises mini squat 10x cues to keep back straight and return to full standing hip extension      Knee/Hip Exercises: Seated   Sit to Sand 10 reps;without UE support   foam mat on table     Knee/Hip Exercises: Supine   Bridges 20 reps    Straight Leg Raises Right;Left;10 reps   fatigued on left LE                   PT Short Term Goals -  01/02/21 1252      PT SHORT TERM GOAL #1   Title 5 x sit to stand in 20 seconds or less    Status On-going      PT SHORT TERM GOAL #2   Title Pt will be able to go up and down 4 steps reciprocally 3 x using one handrail due to improved strength    Status On-going      PT SHORT TERM GOAL #3   Title Pt will perform 6 min walk test for baseline to create long term goal    Baseline 1183 ft    Status Achieved             PT Long Term Goals - 01/02/21 1252      Additional Long Term Goals   Additional Long Term Goals Yes      PT LONG TERM GOAL #6   Title Pt will perform 6 min walk test of at least 1400 ft and pain level only up to 3/10 at most.    Baseline 1183 ft; pain 8-9/10    Status New    Target Date 03/20/21                 Plan - 01/02/21 1248    Clinical Impression Statement Pt able to do 1183 ft during 6 min walk test but has significantly slower gait and more  abnormalities at the end.  Pt demonstrates increased trendelenburg on the left that worsened.  Pt also compains of low back and right knee pain starting halfway through test.  Pt did well with initial exercises.  He was able to demonstrate more endurance and sit to stand exercise went well at home. Pt was educated on and given more exercises to do at home today.  Pt states he has also had pain in wrists and left shoulder.  Pt will benefit from skilled PT to address overall strength and endurance to return to work.    PT Treatment/Interventions ADLs/Self Care Home Management;Cryotherapy;Electrical Stimulation;Moist Heat;Traction;Gait Scientist, forensic;Therapeutic activities;Therapeutic exercise;Balance training;Neuromuscular re-education;Patient/family education;Manual techniques;Taping    PT Next Visit Plan shoulder strength and stability; LE strength progression hip flexion and glutes    PT Home Exercise Plan Access Code: [email or web address]    Consulted and Agree with Plan of Care Patient           Patient will benefit from skilled therapeutic intervention in order to improve the following deficits and impairments:  Pain,Postural dysfunction,Decreased strength,Difficulty walking,Decreased balance,Abnormal gait,Decreased endurance  Visit Diagnosis: Muscle weakness (generalized)  Difficulty in walking, not elsewhere classified  Physical deconditioning     Problem List Patient Active Problem List   Diagnosis Date Noted  . Acute pulmonary embolism ([place]) 10/18/2020  . Groin pain, right 08/31/2020  . Chronic viral hepatitis B without delta-agent ([place]) 08/21/2020  . Amyloidosis ([place]) 08/21/2020  . Anasarca 08/04/2020  . Nephrotic syndrome 08/03/2020  . AKI (acute kidney injury) ([place]) 08/03/2020  . Hypokalemia 06/27/2020  . Claudication in peripheral vascular disease ([place]) 06/26/2020  . Hypertension associated with diabetes ([place]) 05/25/2020  . Peripheral arterial disease ([place]) 03/08/2020  .  Vitamin D deficiency 10/26/2019  . Hypoalbuminemia 10/26/2019  . IBD (inflammatory bowel disease) 04/05/2016  . Tobacco use disorder 03/28/2015  . Non-insulin treated type 2 diabetes mellitus ([place]) 01/25/2014  . Essential hypertension, benign 04/16/2013  . Hyperlipemia 04/16/2013  . Gout 04/16/2013    Jule Ser, PT 01/02/2021, 1:15 PM  [facility] [address]  [place], [place], [place], [address] Phone: [phone]   Fax:  [phone]  Name: Milind Raether Schellhase MRN: [id number] Date of Birth: 13-Feb-1961

## 2021-01-02 NOTE — Assessment & Plan Note (Signed)
Blood pressure is elevated at this time. Patient very upset while rooming. Will continue to monitor and reassess on follow up.

## 2021-01-02 NOTE — Telephone Encounter (Addendum)
Documentation for disability paperwork completed by myself and Dr. Gwenlyn Found.  Paperwork given to our disability/FMLA specialist to fax.

## 2021-01-03 ENCOUNTER — Encounter: Payer: Self-pay | Admitting: Podiatry

## 2021-01-03 ENCOUNTER — Ambulatory Visit: Payer: BC Managed Care – PPO | Admitting: Podiatry

## 2021-01-03 DIAGNOSIS — I739 Peripheral vascular disease, unspecified: Secondary | ICD-10-CM

## 2021-01-03 DIAGNOSIS — M79674 Pain in right toe(s): Secondary | ICD-10-CM

## 2021-01-03 DIAGNOSIS — B351 Tinea unguium: Secondary | ICD-10-CM

## 2021-01-03 DIAGNOSIS — M79675 Pain in left toe(s): Secondary | ICD-10-CM

## 2021-01-03 NOTE — Progress Notes (Signed)
Subjective:   Patient ID: Samuel Butler, male   DOB: 60 y.o.   MRN: [id number]   HPI Patient presents with significant nail disease 1-5 both feet that are thickened and hard for him to cut.  States that he is tried different ways to do this and has not been successful.  Patient does not smoke likes to be active   Review of Systems  All other systems reviewed and are negative.       Objective:  Physical Exam Vitals and nursing note reviewed.  Constitutional:      Appearance: He is well-developed and well-nourished.  Cardiovascular:     Pulses: Intact distal pulses.  Pulmonary:     Effort: Pulmonary effort is normal.  Musculoskeletal:        General: Normal range of motion.  Skin:    General: Skin is warm.  Neurological:     Mental Status: He is alert.     Neurovascular status found to be intact muscle strength adequate range of motion adequate patient noted to have severely elongated nailbeds 1-5 both feet that are thick dystrophic painful and impossible for him to cut.  Good digital perfusion well oriented.  Patient also has a history of stents with vascular disease which is occurred right over left     Assessment:  Thick yellow mycotic nail infection with pain 1-5 both feet with risk factors associated with vascular disease     Plan:  H&P reviewed condition and debridement of all nailbeds accomplished and I discussed with him risk factors associated with vascular disease.  At this point debridement accomplished this can be done routinely there was no iatrogenic bleeding and I recommended that he do daily inspections of his feet and I reviewed vascular disease and its relationship to foot pathology

## 2021-01-04 ENCOUNTER — Other Ambulatory Visit: Payer: Self-pay | Admitting: Hematology and Oncology

## 2021-01-04 DIAGNOSIS — E854 Organ-limited amyloidosis: Secondary | ICD-10-CM

## 2021-01-04 DIAGNOSIS — E8581 Light chain (AL) amyloidosis: Secondary | ICD-10-CM

## 2021-01-04 DIAGNOSIS — N049 Nephrotic syndrome with unspecified morphologic changes: Secondary | ICD-10-CM

## 2021-01-04 NOTE — Progress Notes (Signed)
[place] [phone]   [phone]  PROGRESS NOTE  Patient Care Team: Isaac Bliss, Rayford Halsted, MD as PCP - General (Internal Medicine) Lorretta Harp, MD as PCP - Cardiology (Cardiology)  Hematological/Oncological History # AL Amyloidosis of the Kidney 1) 08/03/2020: Free kappa 19.5, Lambda 205.7, Kappa/Lambda ratio: 0.09. M protein undetectable in serum.  2) 08/07/2020: kidney biopsy performed, results consistent with lambda light chain AL amyloidosis 3) 08/23/2020: establish care with Dr. Lorenso Courier  4) 09/01/2020: bone marrow biopsy shows plasma cell neoplasm with focal amyloid deposits 5) 09/29/2020: start of Cycle 1 Day 1 of Dara-CyBorD 6) 11/02/2020: Cycle 2 Day 1 of Dara-CyBorD 7) 12/01/2020: Cycle 3 Day 1 of Dara-CyBorD 8) 12/29/2020: Cycle 4 Day 1 of Dara-CyBorD 9) 01/05/2021: Holding Velcade due to diarrhea.   Interval History:  Samuel Butler 60 y.o. male with medical history significant for AL amyloidosis of the kidney presents for a follow up visit. The patient's last visit was on 12/15/2020. In the interim since his last visit he has continued his treatment of Dara-CyBorD.   On exam today Samuel Butler notes he has been losing weight in the interim since her last visit.  He is currently down to 217 pounds.  He also notes that his stools have become much more loose and watery.  He reports that this typically occurs about 2-3 times per day and that they are mostly "water".  He also believes that the stools are quite greasy looking.  There has also been some blood on the toilet paper but no blood in the stool or in the toilet bowl.  This has been accompanied with crampy stomach pain predominantly in the lower abdomen.  He has been having some mild issues with numbness and swelling in his lower extremities, however for the most part is improved.  He did have a visit recently with podiatry where they cut his nails.  He also endorses having some decreased  appetite, but overall is willing and able to proceed with treatment at this time.  On further discussion he denies having issues with fevers, chills, sweats, nausea, vomiting or numbness.  He is having some fatigue and tiredness but overall has been well.  A full 10 point ROS is listed below.  MEDICAL HISTORY:  Past Medical History:  Diagnosis Date  . DDD (degenerative disc disease), lumbar    per his report  . Diabetes mellitus without complication ([place])   . Gout 04/16/2013  . Hyperlipidemia   . Hypertension   . IBD (inflammatory bowel disease) 04/05/2016  . Pulmonary embolism ([place])   . Tobacco use disorder 03/28/2015    SURGICAL HISTORY: Past Surgical History:  Procedure Laterality Date  . ABDOMINAL AORTOGRAM W/LOWER EXTREMITY Right 06/26/2020   Procedure: ABDOMINAL AORTOGRAM W/LOWER EXTREMITY;  Surgeon: Lorretta Harp, MD;  Location: [facility];  Service: Cardiovascular;  Laterality: Right;  . COLONOSCOPY    . PERIPHERAL VASCULAR INTERVENTION  06/26/2020   Procedure: PERIPHERAL VASCULAR INTERVENTION;  Surgeon: Lorretta Harp, MD;  Location: [facility];  Service: Cardiovascular;;  Right SFA  . TONSILLECTOMY      SOCIAL HISTORY: Social History   Socioeconomic History  . Marital status: Married    Spouse name: Not on file  . Number of children: Not on file  . Years of education: Not on file  . Highest education level: Not on file  Occupational History  . Not on file  Tobacco Use  . Smoking status:  Former Smoker    Types: Cigarettes    Quit date: 10/23/2016    Years since quitting: 4.2  . Smokeless tobacco: Never Used  . Tobacco comment: per patient 5 cigarettes a day   Vaping Use  . Vaping Use: Never used  Substance and Sexual Activity  . Alcohol use: Not Currently    Comment: occasional  . Drug use: No  . Sexual activity: Not on file  Other Topics Concern  . 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 Difficulty of Paying Living Expenses: Somewhat hard  Food Insecurity: No Food Insecurity  . Worried About Charity fundraiser in the Last Year: Never true  . Ran Out of Food in the Last Year: Never true  Transportation Needs: No Transportation Needs  . Lack of Transportation (Medical): No  . Lack of Transportation (Non-Medical): No  Physical Activity: Not on file  Stress: Not on file  Social Connections: Not on file  Intimate Partner Violence: Not on file    FAMILY HISTORY: Family History  Problem Relation Age of Onset  . Hypertension Father   . Diabetes Father   . Stroke Father   . Colon cancer Brother     ALLERGIES:  is allergic to crestor [rosuvastatin calcium].  MEDICATIONS:  Current Outpatient Medications  Medication Sig Dispense Refill  . acetaminophen (TYLENOL) 650 MG CR tablet Take 1,300 mg by mouth every 8 (eight) hours as needed for pain.     Marland Kitchen acyclovir (ZOVIRAX) 400 MG tablet Take 1 tablet (400 mg total) by mouth 2 (two) times daily. 60 tablet 3  . albuterol (VENTOLIN HFA) 108 (90 Base) MCG/ACT inhaler Inhale 2 puffs into the lungs every 6 (six) hours as needed for wheezing or shortness of breath. 8 g 0  . apixaban (ELIQUIS) 5 MG TABS tablet Take 1 tablet (5 mg total) by mouth 2 (two) times daily. 180 tablet 1  . atorvastatin (LIPITOR) 80 MG tablet Take 1 tablet (80 mg total) by mouth daily. 90 tablet 2  . clopidogrel (PLAVIX) 75 MG tablet Take 1 tablet (75 mg total) by mouth daily with breakfast. 90 tablet 3  . dicyclomine (BENTYL) 10 MG capsule Take 1 capsule (10 mg total) by mouth 4 (four) times daily. Take 4 times daily before meals and at bedtime 90 capsule 0  . Evolocumab (REPATHA SURECLICK) 196 MG/ML SOAJ Inject 140 mg into the skin every 14  (fourteen) days. 2 mL 12  . ezetimibe (ZETIA) 10 MG tablet Take 1 tablet (10 mg total) by mouth daily. 90 tablet 3  . isosorbide mononitrate (IMDUR) 30 MG 24 hr tablet Take 1 tablet by mouth once daily 90 tablet 1  . losartan (COZAAR) 50 MG tablet Take 1 tablet (50 mg total) by mouth daily. 90 tablet 3  . metFORMIN (GLUCOPHAGE) 500 MG tablet Take 1 tablet by mouth once daily with breakfast 90 tablet 1  . ondansetron (ZOFRAN) 8 MG tablet TAKE 1 TABLET BY MOUTH EVERY 8 HOURS AS NEEDED FOR NAUSEA OR VOMITING 30 tablet 0  . pantoprazole (PROTONIX) 40 MG tablet Take 1 tablet (40 mg total) by mouth daily. (  Patient taking differently: Take 40 mg by mouth daily before breakfast.) 90 tablet 1  . potassium chloride SA (KLOR-CON) 20 MEQ tablet Take 2 tablets (40 mEq total) by mouth daily. 80MG x3 days then back to 40 mg/day 33 tablet 6  . torsemide (DEMADEX) 20 MG tablet Take 1 tablet (20 mg total) by mouth 2 (two) times daily. 66m BID x 3 days then back to 260mBID 33 tablet 6  . VEMLIDY 25 MG TABS Take 1 tablet (25 mg total) by mouth daily. 30 tablet 5  . Vitamin D, Ergocalciferol, (DRISDOL) 1.25 MG (50000 UNIT) CAPS capsule Take 1 capsule by mouth once a week 12 capsule 0   No current facility-administered medications for this visit.    REVIEW OF SYSTEMS:   Constitutional: ( - ) fevers, ( - )  chills , ( - ) night sweats Eyes: ( - ) blurriness of vision, ( - ) double vision, ( - ) watery eyes Ears, nose, mouth, throat, and face: ( - ) mucositis, ( - ) sore throat Respiratory: ( - ) cough, ( - ) dyspnea, ( - ) wheezes Cardiovascular: ( - ) palpitation, ( - ) chest discomfort, ( - ) lower extremity swelling Gastrointestinal:  ( - ) nausea, ( - ) heartburn, ( - ) change in bowel habits Skin: ( - ) abnormal skin rashes Lymphatics: ( - ) new lymphadenopathy, ( - ) easy bruising Neurological: ( - ) numbness, ( - ) tingling, ( - ) new weaknesses Behavioral/Psych: ( - ) mood change, ( - ) new changes  All  other systems were reviewed with the patient and are negative.  PHYSICAL EXAMINATION: ECOG PERFORMANCE STATUS: 1 - Symptomatic but completely ambulatory  Vitals:   01/05/21 1122  Pulse: 88  Resp: 18  Temp: 97.6 F (36.4 C)  SpO2: 98%   Filed Weights   01/05/21 1122  Weight: 217 lb 9.6 oz (98.7 kg)    GENERAL: well appearing middle aged AfSerbiamerican male alert, no distress and comfortable SKIN: skin color, texture, turgor are normal, no rashes or significant lesions EYES: conjunctiva are pink and non-injected, sclera clear LUNGS: clear to auscultation and percussion with normal breathing effort HEART: regular rate & rhythm and no murmurs and +2 lower extremity edema bilaterally Musculoskeletal: no cyanosis of digits and no clubbing  PSYCH: alert & oriented x 3, fluent speech NEURO: no focal motor/sensory deficits  LABORATORY DATA:  I have reviewed the data as listed CBC Latest Ref Rng & Units 01/05/2021 12/29/2020 12/22/2020  WBC 4.0 - 10.5 K/uL 4.6 4.8 5.6  Hemoglobin 13.0 - 17.0 g/dL 11.6(L) 11.6(L) 10.9(L)  Hematocrit 39.0 - 52.0 % 35.3(L) 35.2(L) 33.0(L)  Platelets 150 - 400 K/uL 131(L) 128(L) 144(L)    CMP Latest Ref Rng & Units 01/05/2021 12/29/2020 12/22/2020  Glucose 70 - 99 mg/dL 87 91 95  BUN 8 - 23 mg/dL 18 18 21(H)  Creatinine 0.61 - 1.24 mg/dL 1.00 0.98 1.03  Sodium 135 - 145 mmol/L 142 144 144  Potassium 3.5 - 5.1 mmol/L 3.8 4.0 3.7  Chloride 98 - 111 mmol/L 107 109 108  CO2 22 - 32 mmol/L _0 Calcium 8.9 - 10.3 mg/dL 7.7(L) 7.6(L) 7.7(L)  Total Protein 6.5 - 8.1 g/dL 4.3(L) 4.0(L) 4.1(L)  Total Bilirubin 0.3 - 1.2 mg/dL 0.6 0.4 0.4  Alkaline Phos 38 - 126 U/L 71 67 65  AST 15 - 41 U/L _1 ALT 0 - 44 U/L 30  19 20    Lab Results  Component Value Date   MPROTEIN 0.1 (H) 12/15/2020   MPROTEIN 0.1 (H) 11/17/2020   MPROTEIN 0.1 (H) 10/13/2020   Lab Results  Component Value Date   KPAFRELGTCHN 15.9 12/15/2020   KPAFRELGTCHN 12.1 11/17/2020    KPAFRELGTCHN 10.0 10/13/2020   LAMBDASER 21.7 12/15/2020   LAMBDASER 27.4 (H) 11/17/2020   LAMBDASER 56.1 (H) 10/13/2020   KAPLAMBRATIO 0.73 12/15/2020   KAPLAMBRATIO 0.76 (L) 11/21/2020   KAPLAMBRATIO 0.44 11/17/2020     RADIOGRAPHIC STUDIES: No results found.  ASSESSMENT & PLAN Samuel Butler 60 y.o. male with medical history significant for AL amyloidosis of the kidney presents for a follow up visit.  After review the labs, the records, discussion with the patient the findings are most consistent with AL amyloidosis affecting the kidney.  As such I would recommended we start treatment with Dara CyBorD.  After reviewing the biopsy results his findings are most consistent with AL amyloidosis. As such the treatment of choice would be to target his plasma cell population with a triplet or quadruplet therapy. Therapy of choice in this case would consist of daratumumab, Velcade, cyclophosphamide, and dexamethasone. Given the patient's good functional status we will start with full dose Dara-CyBorD. I previously discussed the side effects of this chemotherapy with the patient including neuropathy, elevated blood pressure, drop in blood counts, hypersensitivity reaction, chest tightness, increased infection risk, and fatigue. The patient and family voiced their understanding of these findings and are agreeable to moving forward with quadruple therapy with the goal of being a bridge to bone marrow transplant.   The regimen of choice is daratumumab, bortezomib, cyclophosphamide and dexamethasone per the ANDROMEDA Study ( Blood. 2020 Jul 2;136(1):71-80). Treatment consists of: Cyclophosphamide 300 mg/m2 intravenously and bortezomib 1.3 mg/m2 subcutaneously were given on days 1, 8, 15, and 22 of each 28 day cycle for up to 6 cycles. Dexamethasone 40 mg (starting dose) was given orally or intravenously weekly for each cycle for up to 6 cycles. DARA [place] was administered in a single, premixed vial and  given by manual Bartlett injection over the course of 3 to 5 minutes weekly in cycles 1 to 2, every 2 weeks in cycles 3 to 6, and every 4 weeks thereafter as monotherapy for a maximum of 2 years. We would consider this continued dosing regimen if patient declined or was determined not to be a candidate for BMT.   On exam today Samuel Butler is been tolerating treatment well overall with the exception of the diarrhea he has been having.  Given this diarrhea would recommend we hold the Velcade for the cycle in order to see if this is the etiology.  In the event his diarrhea improves we could consider restarting at a lower dose, however if the diarrhea persists we may need to consider colonoscopy in order to evaluate for amyloid involvement of the bowel.  His hemoglobin is stable and otherwise his labs are acceptable for proceeding with treatment today.   #AL Amyloidosis Affecting the Kidney --bone marrow biopsy and kidney biopsy helped to confirm the diagnosis of AL amyloidosis. --currently being treated with  Dara-CyBorD (per the [facility] Trial), started Cycle 1 Day 1 on 09/29/2020.  --today is Cycle 4 Day 8 of treatment.  --currently holding Velcade in setting of diarrhea (below).  --will monitoring monthly SPEP, UPEP, and SFLC while on treatment. --will have patient return for weekly treatment with q 2 week clinic visits initially.   #Diarrhea, worsening #  Weight Loss --will hold velcade temporarily to see if symptoms improve --amyloidosis  --this combined with diuresis is likely causing his weight loss --continue to monitor  #Injection Site Rash, improved --appears benign on exam today  --completed antibiotic therapy  #Supportive Care --chemotherapy education performed --zofran 55m q8H PRN and compazine 182mPO q6H for nausea --acyclovir 40050mO BID for VCZ prophylaxis --albuterol HFA inhaler for daratumumab treatment -- EMLA cream for port not required, patient declines port at this time, but  will reconsider once treatment starts -- no pain medication required at this time.   No orders of the defined types were placed in this encounter.   All questions were answered. The patient knows to call the clinic with any problems, questions or concerns.  A total of more than 30 minutes were spent on this encounter and over half of that time was spent on counseling and coordination of care as outlined above.   JohLedell PeoplesD Department of Hematology/Oncology [facility] Siteone: [phone]: [phone]: [email or web address]  01/09/2021 3:30 PM   Literature Support:  V, Wendie Chess, Weiss BM, VerFredderick Severanceomenzo RL. Daratumumab plus CyBorD for patients with newly diagnosed AL amyloidosis: safety run-in results of ANDROMEDA. Blood. 2020 Jul 2;136(1):71-80.  --Daratumumab-CyBorD was well tolerated, with no new safety concerns versus the intravenous formulation, and demonstrated robust hematologic and organ responses.

## 2021-01-04 NOTE — Progress Notes (Signed)
Entered in error

## 2021-01-05 ENCOUNTER — Inpatient Hospital Stay: Payer: BC Managed Care – PPO

## 2021-01-05 ENCOUNTER — Other Ambulatory Visit: Payer: Self-pay

## 2021-01-05 ENCOUNTER — Inpatient Hospital Stay (HOSPITAL_BASED_OUTPATIENT_CLINIC_OR_DEPARTMENT_OTHER): Payer: BC Managed Care – PPO | Admitting: Hematology and Oncology

## 2021-01-05 VITALS — HR 88 | Temp 97.6°F | Resp 18 | Ht 71.0 in | Wt 217.6 lb

## 2021-01-05 VITALS — BP 128/78

## 2021-01-05 DIAGNOSIS — E8581 Light chain (AL) amyloidosis: Secondary | ICD-10-CM

## 2021-01-05 DIAGNOSIS — N049 Nephrotic syndrome with unspecified morphologic changes: Secondary | ICD-10-CM

## 2021-01-05 DIAGNOSIS — Z5112 Encounter for antineoplastic immunotherapy: Secondary | ICD-10-CM | POA: Diagnosis not present

## 2021-01-05 DIAGNOSIS — E854 Organ-limited amyloidosis: Secondary | ICD-10-CM

## 2021-01-05 LAB — CMP (CANCER CENTER ONLY)
ALT: 30 U/L (ref 0–44)
AST: 21 U/L (ref 15–41)
Albumin: 1.6 g/dL — ABNORMAL LOW (ref 3.5–5.0)
Alkaline Phosphatase: 71 U/L (ref 38–126)
Anion gap: 7 (ref 5–15)
BUN: 18 mg/dL (ref 8–23)
CO2: 28 mmol/L (ref 22–32)
Calcium: 7.7 mg/dL — ABNORMAL LOW (ref 8.9–10.3)
Chloride: 107 mmol/L (ref 98–111)
Creatinine: 1 mg/dL (ref 0.61–1.24)
GFR, Estimated: >60 mL/min (ref >60)
Glucose, Bld: 87 mg/dL (ref 70–99)
Potassium: 3.8 mmol/L (ref 3.5–5.1)
Sodium: 142 mmol/L (ref 135–145)
Total Bilirubin: 0.6 mg/dL (ref 0.3–1.2)
Total Protein: 4.3 g/dL — ABNORMAL LOW (ref 6.5–8.1)

## 2021-01-05 LAB — LACTATE DEHYDROGENASE: LDH: 264 U/L — ABNORMAL HIGH (ref 98–192)

## 2021-01-05 LAB — CBC WITH DIFFERENTIAL (CANCER CENTER ONLY)
Abs Immature Granulocytes: 0.01 10*3/uL (ref 0.00–0.07)
Basophils Absolute: 0 10*3/uL (ref 0.0–0.1)
Basophils Relative: 0 %
Eosinophils Absolute: 0.1 10*3/uL (ref 0.0–0.5)
Eosinophils Relative: 3 %
HCT: 35.3 % — ABNORMAL LOW (ref 39.0–52.0)
Hemoglobin: 11.6 g/dL — ABNORMAL LOW (ref 13.0–17.0)
Immature Granulocytes: 0 %
Lymphocytes Relative: 24 %
Lymphs Abs: 1.1 10*3/uL (ref 0.7–4.0)
MCH: 32.1 pg (ref 26.0–34.0)
MCHC: 32.9 g/dL (ref 30.0–36.0)
MCV: 97.8 fL (ref 80.0–100.0)
Monocytes Absolute: 0.5 10*3/uL (ref 0.1–1.0)
Monocytes Relative: 10 %
Neutro Abs: 2.9 10*3/uL (ref 1.7–7.7)
Neutrophils Relative %: 63 %
Platelet Count: 131 10*3/uL — ABNORMAL LOW (ref 150–400)
RBC: 3.61 MIL/uL — ABNORMAL LOW (ref 4.22–5.81)
RDW: 17.1 % — ABNORMAL HIGH (ref 11.5–15.5)
WBC Count: 4.6 10*3/uL (ref 4.0–10.5)
nRBC: 0 % (ref 0.0–0.2)

## 2021-01-05 MED ORDER — DEXAMETHASONE 4 MG PO TABS
40.0000 mg | ORAL_TABLET | Freq: Once | ORAL | Status: AC
  Administered 2021-01-05: 40 mg via ORAL

## 2021-01-05 MED ORDER — SODIUM CHLORIDE 0.9 % IV SOLN
300.0000 mg/m2 | Freq: Once | INTRAVENOUS | Status: AC
  Administered 2021-01-05: 680 mg via INTRAVENOUS
  Filled 2021-01-05: qty 34

## 2021-01-05 NOTE — Patient Instructions (Addendum)
[facility] Discharge Instructions for Patients Receiving Chemotherapy  Today you received the following chemotherapy agents cytoxan   To help prevent nausea and vomiting after your treatment, we encourage you to take your nausea medication as directed.    If you develop nausea and vomiting that is not controlled by your nausea medication, call the clinic.   BELOW ARE SYMPTOMS THAT SHOULD BE REPORTED IMMEDIATELY:  *FEVER GREATER THAN 100.5 F  *CHILLS WITH OR WITHOUT FEVER  NAUSEA AND VOMITING THAT IS NOT CONTROLLED WITH YOUR NAUSEA MEDICATION  *UNUSUAL SHORTNESS OF BREATH  *UNUSUAL BRUISING OR BLEEDING  TENDERNESS IN MOUTH AND THROAT WITH OR WITHOUT PRESENCE OF ULCERS  *URINARY PROBLEMS  *BOWEL PROBLEMS  UNUSUAL RASH Items with * indicate a potential emergency and should be followed up as soon as possible.  Feel free to call the clinic should you have any questions or concerns. The clinic phone number is [phone].  Please show the [place] at check-in to the Emergency Department and triage nurse.

## 2021-01-09 ENCOUNTER — Other Ambulatory Visit: Payer: Self-pay | Admitting: *Deleted

## 2021-01-09 ENCOUNTER — Other Ambulatory Visit: Payer: Self-pay

## 2021-01-09 ENCOUNTER — Ambulatory Visit: Payer: BC Managed Care – PPO | Attending: Internal Medicine | Admitting: Physical Therapy

## 2021-01-09 ENCOUNTER — Encounter: Payer: Self-pay | Admitting: Physical Therapy

## 2021-01-09 DIAGNOSIS — R5381 Other malaise: Secondary | ICD-10-CM | POA: Diagnosis present

## 2021-01-09 DIAGNOSIS — M6281 Muscle weakness (generalized): Secondary | ICD-10-CM | POA: Insufficient documentation

## 2021-01-09 DIAGNOSIS — R262 Difficulty in walking, not elsewhere classified: Secondary | ICD-10-CM | POA: Diagnosis present

## 2021-01-09 DIAGNOSIS — E8581 Light chain (AL) amyloidosis: Secondary | ICD-10-CM

## 2021-01-09 NOTE — Therapy (Signed)
[facility] [address], [place], [place], [address] Phone: [phone]   Fax:  [phone]  Physical Therapy Treatment  Patient Details  Name: Samuel Butler MRN: [id number] Date of Birth: [birth year] Referring Provider (PT): Isaac Bliss, Rayford Halsted, MD   Encounter Date: 01/09/2021   PT End of Session - 01/09/21 0940    Visit Number 3    Date for PT Re-Evaluation 03/20/21    Authorization Type BCBS    PT Start Time 0940   locked out of pt's chart initially so late start   PT Stop Time 1015    PT Time Calculation (min) 35 min    Activity Tolerance Patient tolerated treatment well    Behavior During Therapy [facility] for tasks assessed/performed           Past Medical History:  Diagnosis Date  . DDD (degenerative disc disease), lumbar    per his report  . Diabetes mellitus without complication ([place])   . Gout 04/16/2013  . Hyperlipidemia   . Hypertension   . IBD (inflammatory bowel disease) 04/05/2016  . Pulmonary embolism ([place])   . Tobacco use disorder 03/28/2015    Past Surgical History:  Procedure Laterality Date  . ABDOMINAL AORTOGRAM W/LOWER EXTREMITY Right 06/26/2020   Procedure: ABDOMINAL AORTOGRAM W/LOWER EXTREMITY;  Surgeon: Lorretta Harp, MD;  Location: [facility];  Service: Cardiovascular;  Laterality: Right;  . COLONOSCOPY    . PERIPHERAL VASCULAR INTERVENTION  06/26/2020   Procedure: PERIPHERAL VASCULAR INTERVENTION;  Surgeon: Lorretta Harp, MD;  Location: [facility];  Service: Cardiovascular;;  Right SFA  . TONSILLECTOMY      There were no vitals filed for this visit.   Subjective Assessment - 01/09/21 0941    Subjective Patient reporting he is having some stomach issues. May be due to a change in medication. MD is aware.    Pertinent History receiving chemo weekly for AL amyloidosis cancer; has had stents in LE due to blockages    Limitations Standing;Lifting;Walking;House  hold activities    How long can you walk comfortably? 5-10 minutes and then I feel SOB and pain    Patient Stated Goals be able to get up and down large steps and off the floor; be less short of breath    Currently in Pain? No/denies                             [facility] Adult PT Treatment/Exercise - 01/09/21 0001      Knee/Hip Exercises: Stretches   Active Hamstring Stretch Right;Left;2 reps;30 seconds    Active Hamstring Stretch Limitations with strap      Knee/Hip Exercises: Aerobic   Nustep L1 x 5 min for endurance training      Knee/Hip Exercises: Standing   Other Standing Knee Exercises mini squats x 20; 10 at wall      Knee/Hip Exercises: Seated   Sit to Sand 2 sets;5 reps;without UE support   1st set no pad; 2nd set pad     Knee/Hip Exercises: Supine   Bridges 20 reps    Bridges Limitations green; cues to raise higher    Straight Leg Raises Right;Left;10 reps;2 sets   fatigued on left LE     Knee/Hip Exercises: Sidelying   Hip ABduction Both;10 reps    Hip ADduction Both;10 reps    Clams 2x10 green bil      Knee/Hip  Exercises: Prone   Straight Leg Raises Both;2 sets;5 reps    Straight Leg Raises Limitations difficult                    PT Short Term Goals - 01/02/21 1252      PT SHORT TERM GOAL #1   Title 5 x sit to stand in 20 seconds or less    Status On-going      PT SHORT TERM GOAL #2   Title Pt will be able to go up and down 4 steps reciprocally 3 x using one handrail due to improved strength    Status On-going      PT SHORT TERM GOAL #3   Title Pt will perform 6 min walk test for baseline to create long term goal    Baseline 1183 ft    Status Achieved             PT Long Term Goals - 01/02/21 1252      Additional Long Term Goals   Additional Long Term Goals Yes      PT LONG TERM GOAL #6   Title Pt will perform 6 min walk test of at least 1400 ft and pain level only up to 3/10 at most.    Baseline 1183 ft; pain  8-9/10    Status New    Target Date 03/20/21                 Plan - 01/09/21 1615    Clinical Impression Statement Patient did very well today with TE with significant improvements with sit to stand. He was also able to progress reps with many exercises. Mini squat form was modified to decrease forward knees, which patient did well with wall as a cue. Prone hip extension was difficult. LTGs are ongoing.    PT Frequency 2x / week    PT Duration 12 weeks    PT Treatment/Interventions ADLs/Self Care Home Management;Cryotherapy;Electrical Stimulation;Moist Heat;Traction;Gait Scientist, forensic;Therapeutic activities;Therapeutic exercise;Balance training;Neuromuscular re-education;Patient/family education;Manual techniques;Taping    PT Next Visit Plan shoulder strength and stability; LE strength progression hip flexion and glutes    PT Home Exercise Plan Access Code: [email or web address]    Consulted and Agree with Plan of Care Patient           Patient will benefit from skilled therapeutic intervention in order to improve the following deficits and impairments:  Pain,Postural dysfunction,Decreased strength,Difficulty walking,Decreased balance,Abnormal gait,Decreased endurance  Visit Diagnosis: Muscle weakness (generalized)  Difficulty in walking, not elsewhere classified  Physical deconditioning     Problem List Patient Active Problem List   Diagnosis Date Noted  . Acute pulmonary embolism ([place]) 10/18/2020  . Groin pain, right 08/31/2020  . Chronic viral hepatitis B without delta-agent ([place]) 08/21/2020  . Amyloidosis ([place]) 08/21/2020  . Anasarca 08/04/2020  . Nephrotic syndrome 08/03/2020  . AKI (acute kidney injury) ([place]) 08/03/2020  . Hypokalemia 06/27/2020  . Claudication in peripheral vascular disease ([place]) 06/26/2020  . Hypertension associated with diabetes ([place]) 05/25/2020  . Peripheral arterial disease ([place]) 03/08/2020  . Vitamin D deficiency 10/26/2019  .  Hypoalbuminemia 10/26/2019  . IBD (inflammatory bowel disease) 04/05/2016  . Tobacco use disorder 03/28/2015  . Non-insulin treated type 2 diabetes mellitus ([place]) 01/25/2014  . Essential hypertension, benign 04/16/2013  . Hyperlipemia 04/16/2013  . Gout 04/16/2013   Madelyn Flavors PT 01/09/2021, 4:18 PM  [facility] [address] [place], [place], [address] Phone: [phone]  Fax:  [phone]  Name: Samuel Butler MRN: [id number] Date of Birth: 07/12/1961

## 2021-01-11 LAB — UPEP/UIFE/LIGHT CHAINS/TP, 24-HR UR
% BETA, Urine: 11.2 %
ALPHA 1 URINE: 1.6 %
Albumin, U: 80.7 %
Alpha 2, Urine: 5.2 %
Free Kappa Lt Chains,Ur: 13.08 mg/L (ref 0.63–113.79)
Free Kappa/Lambda Ratio: 0.83 — ABNORMAL LOW (ref 1.03–31.76)
Free Lambda Lt Chains,Ur: 15.74 mg/L — ABNORMAL HIGH (ref 0.47–11.77)
GAMMA GLOBULIN URINE: 1.3 %
Total Protein, Urine-Ur/day: 4219 mg/24 hr — ABNORMAL HIGH (ref 30–150)
Total Protein, Urine: 234.4 mg/dL
Total Volume: 1800

## 2021-01-12 ENCOUNTER — Other Ambulatory Visit: Payer: Self-pay

## 2021-01-12 ENCOUNTER — Inpatient Hospital Stay: Payer: BC Managed Care – PPO

## 2021-01-12 ENCOUNTER — Inpatient Hospital Stay: Payer: BC Managed Care – PPO | Attending: Hematology and Oncology

## 2021-01-12 VITALS — BP 125/88 | HR 96 | Temp 98.6°F | Resp 18 | Ht 71.0 in | Wt 210.5 lb

## 2021-01-12 DIAGNOSIS — R5383 Other fatigue: Secondary | ICD-10-CM | POA: Insufficient documentation

## 2021-01-12 DIAGNOSIS — R002 Palpitations: Secondary | ICD-10-CM | POA: Diagnosis not present

## 2021-01-12 DIAGNOSIS — Z5112 Encounter for antineoplastic immunotherapy: Secondary | ICD-10-CM | POA: Insufficient documentation

## 2021-01-12 DIAGNOSIS — R630 Anorexia: Secondary | ICD-10-CM | POA: Insufficient documentation

## 2021-01-12 DIAGNOSIS — Z800 Family history of malignant neoplasm of digestive organs: Secondary | ICD-10-CM | POA: Insufficient documentation

## 2021-01-12 DIAGNOSIS — R200 Anesthesia of skin: Secondary | ICD-10-CM | POA: Insufficient documentation

## 2021-01-12 DIAGNOSIS — E119 Type 2 diabetes mellitus without complications: Secondary | ICD-10-CM | POA: Insufficient documentation

## 2021-01-12 DIAGNOSIS — Z79899 Other long term (current) drug therapy: Secondary | ICD-10-CM | POA: Diagnosis not present

## 2021-01-12 DIAGNOSIS — Z87891 Personal history of nicotine dependence: Secondary | ICD-10-CM | POA: Insufficient documentation

## 2021-01-12 DIAGNOSIS — E8581 Light chain (AL) amyloidosis: Secondary | ICD-10-CM

## 2021-01-12 DIAGNOSIS — R109 Unspecified abdominal pain: Secondary | ICD-10-CM | POA: Diagnosis not present

## 2021-01-12 DIAGNOSIS — G8929 Other chronic pain: Secondary | ICD-10-CM | POA: Diagnosis not present

## 2021-01-12 DIAGNOSIS — I10 Essential (primary) hypertension: Secondary | ICD-10-CM | POA: Diagnosis not present

## 2021-01-12 DIAGNOSIS — Z86711 Personal history of pulmonary embolism: Secondary | ICD-10-CM | POA: Diagnosis not present

## 2021-01-12 DIAGNOSIS — R21 Rash and other nonspecific skin eruption: Secondary | ICD-10-CM | POA: Insufficient documentation

## 2021-01-12 DIAGNOSIS — Z5111 Encounter for antineoplastic chemotherapy: Secondary | ICD-10-CM | POA: Diagnosis present

## 2021-01-12 DIAGNOSIS — Z823 Family history of stroke: Secondary | ICD-10-CM | POA: Insufficient documentation

## 2021-01-12 DIAGNOSIS — R634 Abnormal weight loss: Secondary | ICD-10-CM | POA: Diagnosis not present

## 2021-01-12 DIAGNOSIS — Z7901 Long term (current) use of anticoagulants: Secondary | ICD-10-CM | POA: Insufficient documentation

## 2021-01-12 DIAGNOSIS — E785 Hyperlipidemia, unspecified: Secondary | ICD-10-CM | POA: Diagnosis not present

## 2021-01-12 DIAGNOSIS — Z7984 Long term (current) use of oral hypoglycemic drugs: Secondary | ICD-10-CM | POA: Insufficient documentation

## 2021-01-12 DIAGNOSIS — Z8249 Family history of ischemic heart disease and other diseases of the circulatory system: Secondary | ICD-10-CM | POA: Insufficient documentation

## 2021-01-12 DIAGNOSIS — Z833 Family history of diabetes mellitus: Secondary | ICD-10-CM | POA: Diagnosis not present

## 2021-01-12 DIAGNOSIS — M2550 Pain in unspecified joint: Secondary | ICD-10-CM | POA: Insufficient documentation

## 2021-01-12 DIAGNOSIS — K580 Irritable bowel syndrome with diarrhea: Secondary | ICD-10-CM | POA: Diagnosis not present

## 2021-01-12 DIAGNOSIS — R600 Localized edema: Secondary | ICD-10-CM | POA: Diagnosis not present

## 2021-01-12 LAB — CMP (CANCER CENTER ONLY)
ALT: 26 U/L (ref 0–44)
AST: 22 U/L (ref 15–41)
Albumin: 1.5 g/dL — ABNORMAL LOW (ref 3.5–5.0)
Alkaline Phosphatase: 75 U/L (ref 38–126)
Anion gap: 10 (ref 5–15)
BUN: 15 mg/dL (ref 8–23)
CO2: 29 mmol/L (ref 22–32)
Calcium: 7.7 mg/dL — ABNORMAL LOW (ref 8.9–10.3)
Chloride: 107 mmol/L (ref 98–111)
Creatinine: 0.96 mg/dL (ref 0.61–1.24)
GFR, Estimated: >60 mL/min (ref >60)
Glucose, Bld: 86 mg/dL (ref 70–99)
Potassium: 3.2 mmol/L — ABNORMAL LOW (ref 3.5–5.1)
Sodium: 146 mmol/L — ABNORMAL HIGH (ref 135–145)
Total Bilirubin: 0.7 mg/dL (ref 0.3–1.2)
Total Protein: 4.2 g/dL — ABNORMAL LOW (ref 6.5–8.1)

## 2021-01-12 LAB — CBC WITH DIFFERENTIAL (CANCER CENTER ONLY)
Abs Immature Granulocytes: 0.02 10*3/uL (ref 0.00–0.07)
Basophils Absolute: 0 10*3/uL (ref 0.0–0.1)
Basophils Relative: 0 %
Eosinophils Absolute: 0.1 10*3/uL (ref 0.0–0.5)
Eosinophils Relative: 3 %
HCT: 35.4 % — ABNORMAL LOW (ref 39.0–52.0)
Hemoglobin: 11.6 g/dL — ABNORMAL LOW (ref 13.0–17.0)
Immature Granulocytes: 0 %
Lymphocytes Relative: 23 %
Lymphs Abs: 1.1 10*3/uL (ref 0.7–4.0)
MCH: 32 pg (ref 26.0–34.0)
MCHC: 32.8 g/dL (ref 30.0–36.0)
MCV: 97.5 fL (ref 80.0–100.0)
Monocytes Absolute: 0.4 10*3/uL (ref 0.1–1.0)
Monocytes Relative: 9 %
Neutro Abs: 3.1 10*3/uL (ref 1.7–7.7)
Neutrophils Relative %: 65 %
Platelet Count: 189 10*3/uL (ref 150–400)
RBC: 3.63 MIL/uL — ABNORMAL LOW (ref 4.22–5.81)
RDW: 16.1 % — ABNORMAL HIGH (ref 11.5–15.5)
WBC Count: 4.7 10*3/uL (ref 4.0–10.5)
nRBC: 0 % (ref 0.0–0.2)

## 2021-01-12 LAB — LACTATE DEHYDROGENASE: LDH: 285 U/L — ABNORMAL HIGH (ref 98–192)

## 2021-01-12 MED ORDER — DEXAMETHASONE 4 MG PO TABS
40.0000 mg | ORAL_TABLET | Freq: Once | ORAL | Status: AC
Start: 2021-01-12 — End: 2021-01-12
  Administered 2021-01-12: 40 mg via ORAL

## 2021-01-12 MED ORDER — ACETAMINOPHEN 325 MG PO TABS
650.0000 mg | ORAL_TABLET | Freq: Once | ORAL | Status: AC
  Administered 2021-01-12: 650 mg via ORAL

## 2021-01-12 MED ORDER — ACETAMINOPHEN 325 MG PO TABS
ORAL_TABLET | ORAL | Status: AC
  Filled 2021-01-12: qty 2

## 2021-01-12 MED ORDER — DIPHENHYDRAMINE HCL 25 MG PO CAPS
50.0000 mg | ORAL_CAPSULE | Freq: Once | ORAL | Status: AC
  Administered 2021-01-12: 50 mg via ORAL

## 2021-01-12 MED ORDER — DIPHENHYDRAMINE HCL 25 MG PO CAPS
ORAL_CAPSULE | ORAL | Status: AC
  Filled 2021-01-12: qty 2

## 2021-01-12 MED ORDER — DARATUMUMAB-HYALURONIDASE-FIHJ 1800-30000 MG-UT/15ML ~~LOC~~ SOLN
1800.0000 mg | Freq: Once | SUBCUTANEOUS | Status: AC
  Administered 2021-01-12: 1800 mg via SUBCUTANEOUS
  Filled 2021-01-12: qty 15

## 2021-01-12 MED ORDER — SODIUM CHLORIDE 0.9 % IV SOLN
INTRAVENOUS | Status: DC
  Filled 2021-01-12: qty 250

## 2021-01-12 MED ORDER — SODIUM CHLORIDE 0.9 % IV SOLN
300.0000 mg/m2 | Freq: Once | INTRAVENOUS | Status: AC
  Administered 2021-01-12: 680 mg via INTRAVENOUS
  Filled 2021-01-12: qty 34

## 2021-01-12 NOTE — Patient Instructions (Signed)
[facility] Discharge Instructions for Patients Receiving Chemotherapy  Today you received the following chemotherapy agents Cyclophosphamide (CYTOXAN) & Daratumumab-hyaluronidase (DARZALEX FASPRO).  To help prevent nausea and vomiting after your treatment, we encourage you to take your nausea medication as prescribed.   If you develop nausea and vomiting that is not controlled by your nausea medication, call the clinic.   BELOW ARE SYMPTOMS THAT SHOULD BE REPORTED IMMEDIATELY:  *FEVER GREATER THAN 100.5 F  *CHILLS WITH OR WITHOUT FEVER  NAUSEA AND VOMITING THAT IS NOT CONTROLLED WITH YOUR NAUSEA MEDICATION  *UNUSUAL SHORTNESS OF BREATH  *UNUSUAL BRUISING OR BLEEDING  TENDERNESS IN MOUTH AND THROAT WITH OR WITHOUT PRESENCE OF ULCERS  *URINARY PROBLEMS  *BOWEL PROBLEMS  UNUSUAL RASH Items with * indicate a potential emergency and should be followed up as soon as possible.  Feel free to call the clinic should you have any questions or concerns. The clinic phone number is [phone].  Please show the [place] at check-in to the Emergency Department and triage nurse.

## 2021-01-15 LAB — KAPPA/LAMBDA LIGHT CHAINS
Kappa free light chain: 11.7 mg/L (ref 3.3–19.4)
Kappa, lambda light chain ratio: 0.69 (ref 0.26–1.65)
Lambda free light chains: 16.9 mg/L (ref 5.7–26.3)

## 2021-01-16 ENCOUNTER — Encounter: Payer: Self-pay | Admitting: Internal Medicine

## 2021-01-16 ENCOUNTER — Encounter: Payer: BC Managed Care – PPO | Admitting: Physical Therapy

## 2021-01-16 ENCOUNTER — Encounter: Payer: Self-pay | Admitting: Hematology and Oncology

## 2021-01-16 ENCOUNTER — Encounter: Payer: Self-pay | Admitting: Physical Therapy

## 2021-01-16 LAB — MULTIPLE MYELOMA PANEL, SERUM
Albumin SerPl Elph-Mcnc: 1.8 g/dL — ABNORMAL LOW (ref 2.9–4.4)
Albumin/Glob SerPl: 0.9 (ref 0.7–1.7)
Alpha 1: 0.2 g/dL (ref 0.0–0.4)
Alpha2 Glob SerPl Elph-Mcnc: 1.1 g/dL — ABNORMAL HIGH (ref 0.4–1.0)
B-Globulin SerPl Elph-Mcnc: 0.6 g/dL — ABNORMAL LOW (ref 0.7–1.3)
Gamma Glob SerPl Elph-Mcnc: 0.1 g/dL — ABNORMAL LOW (ref 0.4–1.8)
Globulin, Total: 2.1 g/dL — ABNORMAL LOW (ref 2.2–3.9)
IgA: 38 mg/dL — ABNORMAL LOW (ref 61–437)
IgG (Immunoglobin G), Serum: 137 mg/dL — ABNORMAL LOW (ref 603–1613)
IgM (Immunoglobulin M), Srm: 23 mg/dL (ref 20–172)
Total Protein ELP: 3.9 g/dL — ABNORMAL LOW (ref 6.0–8.5)

## 2021-01-16 MED ORDER — DICYCLOMINE HCL 20 MG PO TABS: 20.0000 mg | ORAL_TABLET | Freq: Three times a day (TID) | ORAL | 2 refills | Status: DC

## 2021-01-16 NOTE — Telephone Encounter (Signed)
Spoke with patient and his wife in regards to Jennifer's recommendations. They are aware that I am sending in a new prescription for Dicyclomine 20 mg to take 4 times daily. Advised patient to keep appt as scheduled with Dr. Fuller Plan on Thursday, 01/18/21. Advised that per 10/2020 clearance request patient was to continue anticoagulation uninterrupted for 3-6 months, advised that if Dr. Fuller Plan thinks procedures are more urgent a new request will be sent to cardiology for the MD to review. They verbalized understanding and had no concerns at the end of the call.   Prescription sent to [facility] on [address] per wife.

## 2021-01-18 ENCOUNTER — Encounter: Payer: Self-pay | Admitting: Gastroenterology

## 2021-01-18 ENCOUNTER — Ambulatory Visit (INDEPENDENT_AMBULATORY_CARE_PROVIDER_SITE_OTHER): Payer: BC Managed Care – PPO | Admitting: Gastroenterology

## 2021-01-18 ENCOUNTER — Other Ambulatory Visit: Payer: Self-pay | Admitting: Internal Medicine

## 2021-01-18 ENCOUNTER — Other Ambulatory Visit: Payer: BC Managed Care – PPO

## 2021-01-18 ENCOUNTER — Telehealth: Payer: Self-pay

## 2021-01-18 ENCOUNTER — Encounter: Payer: Self-pay | Admitting: Internal Medicine

## 2021-01-18 VITALS — BP 128/88 | HR 98 | Ht 71.0 in | Wt 210.0 lb

## 2021-01-18 DIAGNOSIS — R197 Diarrhea, unspecified: Secondary | ICD-10-CM | POA: Diagnosis not present

## 2021-01-18 DIAGNOSIS — R634 Abnormal weight loss: Secondary | ICD-10-CM | POA: Diagnosis not present

## 2021-01-18 DIAGNOSIS — R079 Chest pain, unspecified: Secondary | ICD-10-CM

## 2021-01-18 DIAGNOSIS — R1310 Dysphagia, unspecified: Secondary | ICD-10-CM

## 2021-01-18 DIAGNOSIS — R1030 Lower abdominal pain, unspecified: Secondary | ICD-10-CM | POA: Diagnosis not present

## 2021-01-18 MED ORDER — NA SULFATE-K SULFATE-MG SULF 17.5-3.13-1.6 GM/177ML PO SOLN: 1.0000 | Freq: Once | ORAL | 0 refills | Status: AC

## 2021-01-18 MED ORDER — PANTOPRAZOLE SODIUM 40 MG PO TBEC: 40.0000 mg | DELAYED_RELEASE_TABLET | Freq: Every day | ORAL | 1 refills | Status: AC

## 2021-01-18 MED ORDER — NA SULFATE-K SULFATE-MG SULF 17.5-3.13-1.6 GM/177ML PO SOLN: 1.0000 | Freq: Once | ORAL | 0 refills | Status: DC

## 2021-01-18 NOTE — Telephone Encounter (Signed)
[facility] Pre-operative Risk Assessment     Request for surgical clearance:     Endoscopy Procedure  What type of surgery is being performed?     EGD/Colon  When is this surgery scheduled?     03/28/21  What type of clearance is required ?   Pharmacy  Are there any medications that need to be held prior to surgery and how long? Plavix x 5 days and Eliquis x 2 days  Practice name and name of physician performing surgery?      [facility]  What is your office phone and fax number?      Phone- [phone]  [phone]  Anesthesia type (None, local, MAC, general) ?       MAC

## 2021-01-18 NOTE — Telephone Encounter (Signed)
Patient with diagnosis of PE on 10/18/20 on Eliquis for anticoagulation.    Procedure: EGD/colonoscopy Date of procedure: 03/28/21  CrCl >19mL/min Platelet count 189K  Colonoscopy date is scheduled for ~5 months after PE diagnosis. Request is for 2 day Eliquis hold. Since this is being prescribed by PCP, recommend that clearance come from them in setting of recent PE.  Preop team to address Plavix hold.

## 2021-01-18 NOTE — Telephone Encounter (Signed)
OK to hold plavix for colonoscopy

## 2021-01-18 NOTE — Progress Notes (Signed)
    History of Present Illness: This is a 60 year old male with lower abdominal pain, diarrhea, weight loss, dysphagia.  He has renal amyloidosis and is currently receiving chemotherapy.  He has history of chronic hepatitis B.  He had a PE in early December 2021 and is maintained on Eliquis.  In addition he has had dysphagia with stricture noted at the EG junction on barium esophagram.  His main complaint is postprandial urgent diarrhea and crampy lower abdominal pain.  Dicyclomine has been partially effective for controlling abdominal pain and urgency.  Imodium once or twice daily has been effective for controlling diarrhea.  Stool studies in December 2021 showed enteropathogenic E. coli which was treated with a 3-day course of Cipro.  He relates he is continually losing weight.  He is eating less because eating leads to abdominal pain and diarrhea.  Current Medications, Allergies, Past Medical History, Past Surgical History, Family History and Social History were reviewed in [employer] record.   Physical Exam: General: Well developed, well nourished, no acute distress Head: Normocephalic and atraumatic Eyes: Sclerae anicteric, EOMI Ears: Normal auditory acuity Mouth: Not examined, mask on during Covid-19 pandemic Lungs: Clear throughout to auscultation Heart: Regular rate and rhythm; no murmurs, rubs or bruits Abdomen: Soft, non tender and non distended. No masses, hepatosplenomegaly or hernias noted. Normal Bowel sounds Rectal: Deferred to colonoscopy Musculoskeletal: Symmetrical with no gross deformities  Pulses:  Normal pulses noted Extremities: No clubbing, cyanosis, edema or deformities noted Neurological: Alert oriented x 4, grossly nonfocal Psychological:  Alert and cooperative. Normal mood and affect   Assessment and Recommendations:  1.  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  History of  enteropathogenic E. coli treated in December.  Repeat stool studies today.  Continue dicyclomine 20 mg p.o. before meals and at bedtime.  Imodium 1 p.o. 3 times daily as needed.  Schedule colonoscopy and EGD with possible dilation if cleared to hold Plavix and Eliquis. The risks (including bleeding, perforation, infection, missed lesions, medication reactions and possible hospitalization or surgery if complications occur), benefits, and alternatives to colonoscopy with possible biopsy and possible polypectomy were discussed with the patient and they consent to proceed. The risks (including bleeding, perforation, infection, missed lesions, medication reactions and possible hospitalization or surgery if complications occur), benefits, and alternatives to endoscopy with possible biopsy and possible dilation were discussed with the patient and they consent to proceed.   2.  Renal amyloidosis on chemotherapy followed by Dr. Lorenso Courier.  3.  Chronic hepatitis B.  Abdominal ultrasound with elastography in October 2021 did not show findings of cirrhosis.  4.  History of pulmonary embolism on October 19, 2020 on Eliquis.  5.  Peripheral vascular disease on Plavix.  6. Hold Plavix 5 days, Eliquis 2 days days before procedure - will instruct when and how to resume after procedure. Low but real risk of cardiovascular event such as heart attack, stroke, embolism, thrombosis or ischemia/infarct of other organs off Plavix and Eliquis explained and need to seek urgent help if this occurs. The patient consents to proceed. Will communicate by phone or EMR with patient's prescribing provider to confirm that holding Plavix and Eliquis is reasonable in this case.

## 2021-01-18 NOTE — Telephone Encounter (Signed)
Please advise Dr. Isaac Bliss of patient can hold Eliquis 2 days prior to his procedures scheduled for 03/28/21. Obtaining clearance for Plavix from cardiology.

## 2021-01-18 NOTE — Telephone Encounter (Signed)
Samuel Butler 60 year old male is requesting EGD/colonoscopy.  He was last seen by me on 12/14/2020.  He was doing well at that time.  He reported compliance with his low-salt diet.  His echocardiogram was reviewed and have been normal with the exception of diastolic dysfunction.  He did report some fatigue and physical therapy was recommended.  PMH of essential hypertension, PAD, hepatitis B, type 2 diabetes, AKI, hypokalemia, and vitamin D deficiency.  You performed angiography on 06/26/2020 showing high-grade proximal and mid right SFA disease. Revascularization was performed using right common femoral antegrade approach. A 90% mid left SFA and 95-99% left popliteal lesion with one-vessel runoff was treated. His symptoms of claudication were much improved after intervention. He denied left leg claudication. He reported right groin pain. Ultrasound was ordered and ruled out pseudoaneurysm. He was found to have an inguinal hernia.  Duringhis 11/16/21visit he complained of occasional chest pain which was severe at times. A coronary CTA was ordered and showed pulmonary embolism. His coronary calcium score was 135 which was 60 percentile for age and sex matched control. A likely CTO of the mid RCA with scattered nonobstructive disease in the left circulation. He was contacted and asked to present to the emergency department for treatment of his PE.  May his Plavix be held prior to his procedure?  Thank you for your help.  Please direct your response to CV DIV preop pool.  [name]. Nabila Albarracin NP-C    01/18/2021, 3:31 PM [facility] [address] Office [phone] Fax [phone]

## 2021-01-18 NOTE — Patient Instructions (Signed)
Your provider has requested that you go to the basement level for lab work before leaving today. Press "B" on the elevator. The lab is located at the first door on the left as you exit the elevator.  You have been scheduled for an endoscopy and colonoscopy. Please follow the written instructions given to you at your visit today. Please pick up your prep supplies at the pharmacy within the next 1-3 days. If you use inhalers (even only as needed), please bring them with you on the day of your procedure.  Due to recent changes in healthcare laws, you may see the results of your imaging and laboratory studies on MyChart before your provider has had a chance to review them.  We understand that in some cases there may be results that are confusing or concerning to you. Not all laboratory results come back in the same time frame and the provider may be waiting for multiple results in order to interpret others.  Please give us 48 hours in order for your provider to thoroughly review all the results before contacting the office for clarification of your results.   Thank you for choosing me and [facility].  Malcolm T. Stark, Jr., MD., FACG  

## 2021-01-19 ENCOUNTER — Other Ambulatory Visit: Payer: Self-pay

## 2021-01-19 ENCOUNTER — Telehealth: Payer: Self-pay

## 2021-01-19 ENCOUNTER — Encounter: Payer: Self-pay | Admitting: Internal Medicine

## 2021-01-19 ENCOUNTER — Inpatient Hospital Stay (HOSPITAL_BASED_OUTPATIENT_CLINIC_OR_DEPARTMENT_OTHER): Payer: BC Managed Care – PPO | Admitting: Hematology and Oncology

## 2021-01-19 ENCOUNTER — Inpatient Hospital Stay: Payer: BC Managed Care – PPO

## 2021-01-19 ENCOUNTER — Encounter: Payer: Self-pay | Admitting: Hematology and Oncology

## 2021-01-19 VITALS — BP 131/96 | HR 96 | Temp 97.8°F | Resp 20 | Ht 71.0 in | Wt 211.6 lb

## 2021-01-19 DIAGNOSIS — E8581 Light chain (AL) amyloidosis: Secondary | ICD-10-CM | POA: Diagnosis not present

## 2021-01-19 DIAGNOSIS — N049 Nephrotic syndrome with unspecified morphologic changes: Secondary | ICD-10-CM | POA: Diagnosis not present

## 2021-01-19 DIAGNOSIS — E854 Organ-limited amyloidosis: Secondary | ICD-10-CM

## 2021-01-19 DIAGNOSIS — Z5112 Encounter for antineoplastic immunotherapy: Secondary | ICD-10-CM | POA: Diagnosis not present

## 2021-01-19 LAB — CMP (CANCER CENTER ONLY)
ALT: 29 U/L (ref 0–44)
AST: 26 U/L (ref 15–41)
Albumin: 1.6 g/dL — ABNORMAL LOW (ref 3.5–5.0)
Alkaline Phosphatase: 80 U/L (ref 38–126)
Anion gap: 9 (ref 5–15)
BUN: 20 mg/dL (ref 8–23)
CO2: 31 mmol/L (ref 22–32)
Calcium: 7.7 mg/dL — ABNORMAL LOW (ref 8.9–10.3)
Chloride: 106 mmol/L (ref 98–111)
Creatinine: 1.02 mg/dL (ref 0.61–1.24)
GFR, Estimated: >60 mL/min
Glucose, Bld: 95 mg/dL (ref 70–99)
Potassium: 3.5 mmol/L (ref 3.5–5.1)
Sodium: 146 mmol/L — ABNORMAL HIGH (ref 135–145)
Total Bilirubin: 0.5 mg/dL (ref 0.3–1.2)
Total Protein: 4.1 g/dL — ABNORMAL LOW (ref 6.5–8.1)

## 2021-01-19 LAB — CBC WITH DIFFERENTIAL (CANCER CENTER ONLY)
Abs Immature Granulocytes: 0 10*3/uL (ref 0.00–0.07)
Basophils Absolute: 0 10*3/uL (ref 0.0–0.1)
Basophils Relative: 0 %
Eosinophils Absolute: 0.1 10*3/uL (ref 0.0–0.5)
Eosinophils Relative: 3 %
HCT: 36.2 % — ABNORMAL LOW (ref 39.0–52.0)
Hemoglobin: 12 g/dL — ABNORMAL LOW (ref 13.0–17.0)
Immature Granulocytes: 0 %
Lymphocytes Relative: 25 %
Lymphs Abs: 1.2 10*3/uL (ref 0.7–4.0)
MCH: 32.4 pg (ref 26.0–34.0)
MCHC: 33.1 g/dL (ref 30.0–36.0)
MCV: 97.8 fL (ref 80.0–100.0)
Monocytes Absolute: 0.3 10*3/uL (ref 0.1–1.0)
Monocytes Relative: 7 %
Neutro Abs: 3.1 10*3/uL (ref 1.7–7.7)
Neutrophils Relative %: 65 %
Platelet Count: 176 10*3/uL (ref 150–400)
RBC: 3.7 MIL/uL — ABNORMAL LOW (ref 4.22–5.81)
RDW: 15.9 % — ABNORMAL HIGH (ref 11.5–15.5)
WBC Count: 4.7 10*3/uL (ref 4.0–10.5)
nRBC: 0 % (ref 0.0–0.2)

## 2021-01-19 LAB — LACTATE DEHYDROGENASE: LDH: 324 U/L — ABNORMAL HIGH (ref 98–192)

## 2021-01-19 MED ORDER — SODIUM CHLORIDE 0.9 % IV SOLN
Freq: Once | INTRAVENOUS | Status: AC
  Filled 2021-01-19: qty 250

## 2021-01-19 MED ORDER — SODIUM CHLORIDE 0.9 % IV SOLN
300.0000 mg/m2 | Freq: Once | INTRAVENOUS | Status: AC
  Administered 2021-01-19: 680 mg via INTRAVENOUS
  Filled 2021-01-19: qty 34

## 2021-01-19 MED ORDER — BORTEZOMIB CHEMO SQ INJECTION 3.5 MG (2.5MG/ML)
1.3000 mg/m2 | Freq: Once | INTRAMUSCULAR | Status: AC
  Administered 2021-01-19: 3 mg via SUBCUTANEOUS
  Filled 2021-01-19: qty 1.2

## 2021-01-19 MED ORDER — DEXAMETHASONE 4 MG PO TABS
40.0000 mg | ORAL_TABLET | Freq: Once | ORAL | Status: AC
  Administered 2021-01-19: 40 mg via ORAL

## 2021-01-19 NOTE — Progress Notes (Signed)
[facility] Telephone:(336) [phone]   Fax:([phone]  PROGRESS NOTE  Patient Care Team: Isaac Bliss, Rayford Halsted, MD as PCP - General (Internal Medicine) Lorretta Harp, MD as PCP - Cardiology (Cardiology)  Hematological/Oncological History # AL Amyloidosis of the Kidney 1) 08/03/2020: Free kappa 19.5, Lambda 205.7, Kappa/Lambda ratio: 0.09. M protein undetectable in serum.  2) 08/07/2020: kidney biopsy performed, results consistent with lambda light chain AL amyloidosis 3) 08/23/2020: establish care with Dr. Lorenso Courier  4) 09/01/2020: bone marrow biopsy shows plasma cell neoplasm with focal amyloid deposits 5) 09/29/2020: start of Cycle 1 Day 1 of Dara-CyBorD 6) 11/02/2020: Cycle 2 Day 1 of Dara-CyBorD 7) 12/01/2020: Cycle 3 Day 1 of Dara-CyBorD 8) 12/29/2020: Cycle 4 Day 1 of Dara-CyBorD 9) 01/05/2021: Holding Velcade due to diarrhea.  10) 01/19/2021: Adding back Velcade due to improvement of diarrhea with imodium  Interval History:  Samuel Butler 60 y.o. male with medical history significant for AL amyloidosis of the kidney presents for a follow up visit. The patient's last visit was on 01/05/2021. In the interim since his last visit he has continued his treatment of Dara-CyBorD (holding Velcade since 01/05/21 due to diarrhea).   On exam today Mr. Poplaski reports that his energy levels are 50% from baseline.  He still continues to complete all his ADLs on his own.  He was busy last week due to the passing of his brother and helping with funeral arrangements.  He continues with physical therapy twice a week.  He reports having a poor appetite since starting treatment in November 2021 with noted weight loss.  He tries to eat small, frequent meals and supplements with protein shakes.  He denies any nausea or vomiting.  Patient reports improvement of diarrhea and abdominal cramping since taking over-the-counter liquid Imodium x1 dose and Bentyl as prescribed.  He he used to see  blood on the toilet paper when he wiped but that has stopped since his diarrhea improved.  He is chronic joint pain that he manages with Tylenol as needed.  Patient reports improvement of lower extremity edema.  He is compliant with wearing compression stockings.  He has chronic neuropathy in his toes without any interference with his balance.  He denies any fevers, chills, night sweats, worsening shortness of breath, chest pain or cough.  He has no other complaints.  A full 10 point ROS is listed below.  MEDICAL HISTORY:  Past Medical History:  Diagnosis Date  . DDD (degenerative disc disease), lumbar    per his report  . Diabetes mellitus without complication ([place])   . Gout 04/16/2013  . Hyperlipidemia   . Hypertension   . IBD (inflammatory bowel disease) 04/05/2016  . Pulmonary embolism ([place])   . Tobacco use disorder 03/28/2015    SURGICAL HISTORY: Past Surgical History:  Procedure Laterality Date  . ABDOMINAL AORTOGRAM W/LOWER EXTREMITY Right 06/26/2020   Procedure: ABDOMINAL AORTOGRAM W/LOWER EXTREMITY;  Surgeon: Lorretta Harp, MD;  Location: [facility];  Service: Cardiovascular;  Laterality: Right;  . COLONOSCOPY    . PERIPHERAL VASCULAR INTERVENTION  06/26/2020   Procedure: PERIPHERAL VASCULAR INTERVENTION;  Surgeon: Lorretta Harp, MD;  Location: [facility];  Service: Cardiovascular;;  Right SFA  . TONSILLECTOMY      SOCIAL HISTORY: Social History   Socioeconomic History  . Marital status: Married    Spouse name: Not on file  . Number of children: Not on file  . Years of education: Not on  file  . Highest education level: Not on file  Occupational History  . Not on file  Tobacco Use  . Smoking status: Former Smoker    Types: Cigarettes    Quit date: 10/23/2016    Years since quitting: 4.2  . Smokeless tobacco: Never Used  . Tobacco comment: per patient 5 cigarettes a day   Vaping Use  . Vaping Use: Never used  Substance and Sexual Activity  .  Alcohol use: Not Currently    Comment: occasional  . Drug use: No  . Sexual activity: Not on file  Other Topics Concern  . 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 Difficulty of Paying Living Expenses: Somewhat hard  Food Insecurity: No Food Insecurity  . Worried About Charity fundraiser in the Last Year: Never true  . Ran Out of Food in the Last Year: Never true  Transportation Needs: No Transportation Needs  . Lack of Transportation (Medical): No  . Lack of Transportation (Non-Medical): No  Physical Activity: Not on file  Stress: Not on file  Social Connections: Not on file  Intimate Partner Violence: Not on file    FAMILY HISTORY: Family History  Problem Relation Age of Onset  . Hypertension Father   . Diabetes Father   . Stroke Father   . Pancreatic cancer Father   . Colon cancer Brother   . Esophageal cancer Neg Hx   . Stomach cancer Neg Hx     ALLERGIES:  is allergic to crestor [rosuvastatin calcium].  MEDICATIONS:  Current Outpatient Medications  Medication Sig Dispense Refill  . acetaminophen (TYLENOL) 650 MG CR tablet Take 1,300 mg by mouth every 8 (eight) hours as needed for pain.     Marland Kitchen acyclovir (ZOVIRAX) 400 MG tablet Take 1 tablet (400 mg total) by mouth 2 (two) times daily. 60 tablet 3  . albuterol (VENTOLIN HFA) 108 (90 Base) MCG/ACT inhaler Inhale 2 puffs into the lungs every 6 (six) hours as needed for wheezing or shortness of breath. 8 g 0  . apixaban (ELIQUIS) 5 MG TABS tablet Take 1 tablet (5 mg total) by mouth 2 (two) times daily. 180 tablet 1  . atorvastatin (LIPITOR) 80 MG tablet Take 1 tablet (80 mg total) by mouth daily. 90 tablet 2  . clopidogrel (PLAVIX) 75 MG tablet Take 1 tablet  (75 mg total) by mouth daily with breakfast. 90 tablet 3  . dicyclomine (BENTYL) 20 MG tablet Take 1 tablet (20 mg total) by mouth 4 (four) times daily -  before meals and at bedtime. Take 20-30 minutes before meals. 120 tablet 2  . Evolocumab (REPATHA SURECLICK) 017 MG/ML SOAJ Inject 140 mg into the skin every 14 (fourteen) days. 2 mL 12  . ezetimibe (ZETIA) 10 MG tablet Take 1 tablet (10 mg total) by mouth daily. 90 tablet 3  . isosorbide mononitrate (IMDUR) 30 MG 24 hr tablet Take 1 tablet by mouth once daily 90 tablet 1  . losartan (COZAAR) 50 MG tablet Take 1 tablet (50 mg total) by mouth daily. 90 tablet 3  . metFORMIN (GLUCOPHAGE) 500 MG tablet Take 1 tablet by mouth  once daily with breakfast 90 tablet 1  . ondansetron (ZOFRAN) 8 MG tablet TAKE 1 TABLET BY MOUTH EVERY 8 HOURS AS NEEDED FOR NAUSEA OR VOMITING 30 tablet 0  . pantoprazole (PROTONIX) 40 MG tablet Take 1 tablet (40 mg total) by mouth daily. 90 tablet 1  . potassium chloride SA (KLOR-CON) 20 MEQ tablet Take 2 tablets (40 mEq total) by mouth daily. 80MG x3 days then back to 40 mg/day 33 tablet 6  . torsemide (DEMADEX) 20 MG tablet Take 1 tablet (20 mg total) by mouth 2 (two) times daily. 23m BID x 3 days then back to 274mBID 33 tablet 6  . VEMLIDY 25 MG TABS Take 1 tablet (25 mg total) by mouth daily. 30 tablet 5  . Vitamin D, Ergocalciferol, (DRISDOL) 1.25 MG (50000 UNIT) CAPS capsule Take 1 capsule by mouth once a week 12 capsule 0   No current facility-administered medications for this visit.    REVIEW OF SYSTEMS:   Constitutional: ( - ) fevers, ( - )  chills , ( - ) night sweats Eyes: ( - ) blurriness of vision, ( - ) double vision, ( - ) watery eyes Ears, nose, mouth, throat, and face: ( - ) mucositis, ( - ) sore throat Respiratory: ( - ) cough, ( - ) dyspnea, ( - ) wheezes Cardiovascular: ( - ) palpitation, ( - ) chest discomfort, ( - ) lower extremity swelling Gastrointestinal:  ( - ) nausea, ( - ) heartburn, ( - )  change in bowel habits Skin: ( - ) abnormal skin rashes Lymphatics: ( - ) new lymphadenopathy, ( - ) easy bruising Neurological: (+) numbness, ( - ) tingling, ( - ) new weaknesses Behavioral/Psych: ( - ) mood change, ( - ) new changes  All other systems were reviewed with the patient and are negative.  PHYSICAL EXAMINATION: ECOG PERFORMANCE STATUS: 1 - Symptomatic but completely ambulatory  Vitals:   01/19/21 1033  BP: (!) 131/96  Pulse: 96  Resp: 20  Temp: 97.8 F (36.6 C)  SpO2: 97%   Filed Weights   01/19/21 1033  Weight: 211 lb 9.6 oz (96 kg)    GENERAL: well appearing middle aged AfSerbiamerican male alert, no distress and comfortable SKIN: skin color, texture, turgor are normal, no rashes or significant lesions EYES: conjunctiva are pink and non-injected, sclera clear LUNGS: clear to auscultation and percussion with normal breathing effort HEART: regular rate & rhythm and no murmurs and +2 lower extremity edema bilaterally (L > R) Musculoskeletal: no cyanosis of digits and no clubbing  PSYCH: alert & oriented x 3, fluent speech NEURO: no focal motor/sensory deficits  LABORATORY DATA:  I have reviewed the data as listed CBC Latest Ref Rng & Units 01/19/2021 01/12/2021 01/05/2021  WBC 4.0 - 10.5 K/uL 4.7 4.7 4.6  Hemoglobin 13.0 - 17.0 g/dL 12.0(L) 11.6(L) 11.6(L)  Hematocrit 39.0 - 52.0 % 36.2(L) 35.4(L) 35.3(L)  Platelets 150 - 400 K/uL 176 189 131(L)    CMP Latest Ref Rng & Units 01/19/2021 01/12/2021 01/05/2021  Glucose 70 - 99 mg/dL 95 86 87  BUN 8 - 23 mg/dL _0 Creatinine 0.61 - 1.24 mg/dL 1.02 0.96 1.00  Sodium 135 - 145 mmol/L 146(H) 146(H) 142  Potassium 3.5 - 5.1 mmol/L 3.5 3.2(L) 3.8  Chloride 98 - 111 mmol/L 106 107 107  CO2 22 - 32 mmol/L _1 Calcium 8.9 - 10.3 mg/dL 7.7(L) 7.7(L) 7.7(L)  Total Protein 6.5 -  8.1 g/dL 4.1(L) 4.2(L) 4.3(L)  Total Bilirubin 0.3 - 1.2 mg/dL 0.5 0.7 0.6  Alkaline Phos 38 - 126 U/L 80 75 71  AST 15 - 41 U/L _0 ALT 0 - 44 U/L _1 Lab Results  Component Value Date   MPROTEIN Not Observed 01/12/2021   MPROTEIN 0.1 (H) 12/15/2020   MPROTEIN 0.1 (H) 11/17/2020   Lab Results  Component Value Date   KPAFRELGTCHN 11.7 01/12/2021   KPAFRELGTCHN 15.9 12/15/2020   KPAFRELGTCHN 12.1 11/17/2020   LAMBDASER 16.9 01/12/2021   LAMBDASER 21.7 12/15/2020   LAMBDASER 27.4 (H) 11/17/2020   KAPLAMBRATIO 0.69 01/12/2021   KAPLAMBRATIO 0.83 (L) 01/09/2021   KAPLAMBRATIO 0.73 12/15/2020     RADIOGRAPHIC STUDIES: No results found.  ASSESSMENT & PLAN Lavonta A Roig 60 y.o. male with medical history significant for AL amyloidosis of the kidney presents for a follow up visit.  After review the labs, the records, discussion with the patient the findings are most consistent with AL amyloidosis affecting the kidney.  As such I would recommended we start treatment with Dara CyBorD.  After reviewing the biopsy results his findings are most consistent with AL amyloidosis. As such the treatment of choice would be to target his plasma cell population with a triplet or quadruplet therapy. Therapy of choice in this case would consist of daratumumab, Velcade, cyclophosphamide, and dexamethasone. Given the patient's good functional status we will start with full dose Dara-CyBorD. I previously discussed the side effects of this chemotherapy with the patient including neuropathy, elevated blood pressure, drop in blood counts, hypersensitivity reaction, chest tightness, increased infection risk, and fatigue. The patient and family voiced their understanding of these findings and are agreeable to moving forward with quadruple therapy with the goal of being a bridge to bone marrow transplant.   The regimen of choice is daratumumab, bortezomib, cyclophosphamide and dexamethasone per the ANDROMEDA Study ( Blood. 2020 Jul 2;136(1):71-80). Treatment consists of: Cyclophosphamide 300 mg/m2 intravenously and bortezomib  1.3 mg/m2 subcutaneously were given on days 1, 8, 15, and 22 of each 28 day cycle for up to 6 cycles. Dexamethasone 40 mg (starting dose) was given orally or intravenously weekly for each cycle for up to 6 cycles. DARA Haynes was administered in a single, premixed vial and given by manual  Chapel injection over the course of 3 to 5 minutes weekly in cycles 1 to 2, every 2 weeks in cycles 3 to 6, and every 4 weeks thereafter as monotherapy for a maximum of 2 years. We would consider this continued dosing regimen if patient declined or was determined not to be a candidate for BMT.   On exam today,  Mr. Farra is feeling better with improvement of diarrhea.  Labs from today were reviewed without any intervention needed.  Patient will proceed with treatment as planned and we will add back Velcade starting today.  Patient will return to the clinic in 2 weeks as scheduled.  #AL Amyloidosis Affecting the Kidney --bone marrow biopsy and kidney biopsy helped to confirm the diagnosis of AL amyloidosis. --currently being treated with  Dara-CyBorD (per the [facility] Trial), started Cycle 1 Day 1 on 09/29/2020.  --today is Cycle 4 Day 22 of treatment.  --adding back Velcade due to improvement of diarrhea --will monitoring monthly SPEP, UPEP, and SFLC while on treatment. --will have patient return for weekly treatment with q 2 week clinic visits initially.   #Diarrhea with abdominal cramping --Improved with Imodium and  Bentyl. --GI waiting for cardiac clearance to schedule EGD/Colonoscopy   #Weight Loss --Diarrhea combined with diuresis is likely causing his weight loss --continue to monitor  #Injection Site Rash, improved --appears benign on exam today  --completed antibiotic therapy  #Supportive Care --chemotherapy education performed --zofran 39m q8H PRN and compazine 131mPO q6H for nausea --acyclovir 40048mO BID for VCZ prophylaxis --albuterol HFA inhaler for daratumumab treatment -- EMLA cream for port  not required, patient declines port at this time, but will reconsider once treatment starts -- no pain medication required at this time.   No orders of the defined types were placed in this encounter.   All questions were answered. The patient knows to call the clinic with any problems, questions or concerns.  A total of more than 30 minutes were spent on this encounter and over half of that time was spent on counseling and coordination of care as outlined above.   JohLedell PeoplesD Department of Hematology/Oncology [facility]: [phone]: [phone]: [email or web address]  01/19/2021 6:45 PM   Literature Support:  V, Wendie Chess, Weiss BM, VerFredderick Severanceomenzo RL. Daratumumab plus CyBorD for patients with newly diagnosed AL amyloidosis: safety run-in results of ANDROMEDA. Blood. 2020 Jul 2;136(1):71-80.  --Daratumumab-CyBorD was well tolerated, with no new safety concerns versus the intravenous formulation, and demonstrated robust hematologic and organ responses.

## 2021-01-19 NOTE — Telephone Encounter (Signed)
Inbound call from cardiologist stating patient can hold Plavix 5 days and Eliquis 2 days before procedure.

## 2021-01-19 NOTE — Patient Instructions (Signed)
[facility] Discharge Instructions for Patients Receiving Chemotherapy  Today you received the following chemotherapy agents Cyclophosphamide (CYTOXAN) & Velcade   To help prevent nausea and vomiting after your treatment, we encourage you to take your nausea medication as prescribed.   If you develop nausea and vomiting that is not controlled by your nausea medication, call the clinic.   BELOW ARE SYMPTOMS THAT SHOULD BE REPORTED IMMEDIATELY:  *FEVER GREATER THAN 100.5 F  *CHILLS WITH OR WITHOUT FEVER  NAUSEA AND VOMITING THAT IS NOT CONTROLLED WITH YOUR NAUSEA MEDICATION  *UNUSUAL SHORTNESS OF BREATH  *UNUSUAL BRUISING OR BLEEDING  TENDERNESS IN MOUTH AND THROAT WITH OR WITHOUT PRESENCE OF ULCERS  *URINARY PROBLEMS  *BOWEL PROBLEMS  UNUSUAL RASH Items with * indicate a potential emergency and should be followed up as soon as possible.  Feel free to call the clinic should you have any questions or concerns. The clinic phone number is [phone].  Please show the [facility] at check-in to the Emergency Department and triage nurse.

## 2021-01-19 NOTE — Telephone Encounter (Signed)
I attempted to contact Mr.Dicenzo to discuss his soreness in his chest.  Unable to leave a voicemail- will send mychart message advising we attempted to contact and to let on-call provider know of further issues.

## 2021-01-19 NOTE — Telephone Encounter (Signed)
Called [employer]. Spoke Left message with Wyatt Portela. Gave hold instructions on Plavix and Eliquis

## 2021-01-19 NOTE — Telephone Encounter (Signed)
Attempted to call patient regarding instructions for medication hold. Unable to leave message will call office performing procedure

## 2021-01-19 NOTE — Telephone Encounter (Signed)
   Primary Cardiologist: Quay Burow, MD  Chart reviewed as part of pre-operative protocol coverage. Given past medical history and time since last visit, based on ACC/AHA guidelines, Samuel Butler would be at acceptable risk for the planned procedure without further cardiovascular testing.   His Plavix may be held for 5 days prior to his colonoscopy.  Please resume as soon as hemostasis is achieved at the discretion of the surgeon.  Patient with diagnosis of PE on 10/18/20 on Eliquis for anticoagulation.  Colonoscopy date is scheduled for ~5 months after PE diagnosis. Request is for 2 day Eliquis hold. Since this is being prescribed by PCP, recommend that clearance come from them in setting of recent PE.  I will route this recommendation to the requesting party via Epic fax function and remove from pre-op pool.  Please call with questions.  [name]. [name] NP-C    01/19/2021, 7:02 AM [address] Office [phone] Fax [phone]

## 2021-01-22 ENCOUNTER — Other Ambulatory Visit: Payer: BC Managed Care – PPO

## 2021-01-22 ENCOUNTER — Telehealth: Payer: Self-pay | Admitting: Hematology and Oncology

## 2021-01-22 DIAGNOSIS — R1030 Lower abdominal pain, unspecified: Secondary | ICD-10-CM

## 2021-01-22 DIAGNOSIS — R197 Diarrhea, unspecified: Secondary | ICD-10-CM

## 2021-01-22 DIAGNOSIS — R634 Abnormal weight loss: Secondary | ICD-10-CM

## 2021-01-22 NOTE — Telephone Encounter (Signed)
Scheduled per 3/11 los. Pt will receive an updated appt calendar

## 2021-01-23 ENCOUNTER — Encounter: Payer: Self-pay | Admitting: Physical Therapy

## 2021-01-23 ENCOUNTER — Other Ambulatory Visit: Payer: Self-pay

## 2021-01-23 ENCOUNTER — Ambulatory Visit: Payer: BC Managed Care – PPO | Admitting: Cardiovascular Disease

## 2021-01-23 ENCOUNTER — Ambulatory Visit: Payer: BC Managed Care – PPO | Admitting: Physical Therapy

## 2021-01-23 DIAGNOSIS — R5381 Other malaise: Secondary | ICD-10-CM

## 2021-01-23 DIAGNOSIS — M6281 Muscle weakness (generalized): Secondary | ICD-10-CM | POA: Diagnosis not present

## 2021-01-23 DIAGNOSIS — R262 Difficulty in walking, not elsewhere classified: Secondary | ICD-10-CM

## 2021-01-23 NOTE — Patient Instructions (Signed)
Access Code: [email or web address] URL: [email or web address] Date: 01/23/2021 Prepared by: Almyra Free  Exercises Sit to Stand with Hands on Knees - 4 x daily - 7 x weekly - 1 sets - 3 reps Clamshell - 1 x daily - 7 x weekly - 3 sets - 10 reps Hooklying Clamshells with Resistance - 1 x daily - 7 x weekly - 3 sets - 10 reps Supine Active Straight Leg Raise - 1 x daily - 7 x weekly - 3 sets - 10 reps Mini Squat - 1 x daily - 7 x weekly - 3 sets - 10 reps Standing Heel Raise - 1 x daily - 7 x weekly - 3 sets - 10 reps Single Leg Stance - 2 x daily - 7 x weekly - 1 sets - 10 reps Tandem Stance with Head Rotation - 2 x daily - 7 x weekly - 1 sets - 10 reps Walking Tandem Stance - 2 x daily - 7 x weekly - 2 sets - 10 reps Forward Monster Walk with Resistance (BKA) - 1 x daily - 7 x weekly - 3 sets - 10 reps

## 2021-01-23 NOTE — Therapy (Signed)
[facility] [address], [place], [place], [address] Phone: [phone]   Fax:  [phone]  Physical Therapy Treatment  Patient Details  Name: Samuel Butler MRN: [id number] Date of Birth: April 21, 1961 Referring Provider (PT): Isaac Bliss, Rayford Halsted, MD   Encounter Date: 01/23/2021   PT End of Session - 01/23/21 1019    Visit Number 4    Date for PT Re-Evaluation 03/20/21    Authorization Type BCBS    PT Start Time 1016    PT Stop Time 1108    PT Time Calculation (min) 52 min    Activity Tolerance Patient tolerated treatment well    Behavior During Therapy [facility] for tasks assessed/performed           Past Medical History:  Diagnosis Date  . DDD (degenerative disc disease), lumbar    per his report  . Diabetes mellitus without complication ([place])   . Gout 04/16/2013  . Hyperlipidemia   . Hypertension   . IBD (inflammatory bowel disease) 04/05/2016  . Pulmonary embolism ([place])   . Tobacco use disorder 03/28/2015    Past Surgical History:  Procedure Laterality Date  . ABDOMINAL AORTOGRAM W/LOWER EXTREMITY Right 06/26/2020   Procedure: ABDOMINAL AORTOGRAM W/LOWER EXTREMITY;  Surgeon: Lorretta Harp, MD;  Location: [place] [facility];  Service: Cardiovascular;  Laterality: Right;  . COLONOSCOPY    . PERIPHERAL VASCULAR INTERVENTION  06/26/2020   Procedure: PERIPHERAL VASCULAR INTERVENTION;  Surgeon: Lorretta Harp, MD;  Location: [place] [facility];  Service: Cardiovascular;;  Right SFA  . TONSILLECTOMY      There were no vitals filed for this visit.   Subjective Assessment - 01/23/21 1020    Subjective Patient feeling better. He' s coming off his blood thinner so he can have testing done for his stomach. Legs still feeling weak. Sitting to standing, stepping up or bending down to get something.    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 5-10 minutes and then I feel SOB and pain    Patient Stated Goals be able to get up and down large steps and off the floor; be less short of breath    Currently in Pain? Yes    Pain Score 5     Pain Location Knee    Pain Orientation Right;Left    Pain Descriptors / Indicators Aching              [facility] PT Assessment - 01/23/21 0001      Single Leg Stance   Comments 15 sec bil      Transfers   Five time sit to stand comments  15.09 sec                         [facility] Adult PT Treatment/Exercise - 01/23/21 0001      Therapeutic Activites    Therapeutic Activities Other Therapeutic Activities    Other Therapeutic Activities floor to stand transfer onto pad at side of mat table      Knee/Hip Exercises: Aerobic   Nustep L2 x 6 min      Knee/Hip Exercises: Machines for Strengthening   Cybex Leg Press 80# seat 8 x 10; first he did 60# 10 ea at seat 9 then seat 8  Knee/Hip Exercises: Standing   Hip Flexion Both;2 sets;5 sets    Hip Flexion Limitations red loop around thighs    Hip Abduction Both;4 sets;10 reps    Abduction Limitations walking red loop; then monster walk 2x 10    Lateral Step Up 10 reps;Both;Hand Hold: 1;Step Height: 4"    Forward Step Up 10 reps;Both;Hand Hold: 1;Step Height: 4"    Functional Squat 10 reps    Functional Squat Limitations 15# kettle bell; with cues to squeeze gluts with upon standing      Knee/Hip Exercises: Seated   Sit to Sand 1 set;5 reps               Balance Exercises - 01/23/21 0001      Balance Exercises: Standing   Tandem Stance Eyes open;Foam/compliant surface;5 reps   on level ground first; then compliant; max hold bil; also with head turns   SLS Solid surface;Foam/compliant surface;5 reps   max hold bil; > 15 sec; added head turns   Gait with Head Turns Forward    Tandem Gait Forward;4 reps    Step Over  Hurdles / Cones 10 feet x 4             PT Education - 01/23/21 1145    Education Details HEP progressed    Person(s) Educated Patient    Methods Explanation;Demonstration;Handout    Comprehension Verbalized understanding;Returned demonstration            PT Short Term Goals - 01/23/21 1022      PT SHORT TERM GOAL #1   Title 5 x sit to stand in 20 seconds or less    Baseline 15.09 seconds    Status Achieved      PT SHORT TERM GOAL #2   Title Pt will be able to go up and down 4 steps reciprocally 3 x using one handrail due to improved strength    Status On-going             PT Long Term Goals - 01/23/21 1208      PT LONG TERM GOAL #2   Title Be able to get up and down off the floor without assistance    Baseline min assist and UE support    Status On-going      PT LONG TERM GOAL #4   Title Pt will be able to perform single leg stand for at least10 seconds for reduced risk of falls    Status Achieved                 Plan - 01/23/21 1209    Clinical Impression Statement Patient presented today feeling much better than last visit. He was able to tolerate increased activity today both with strengthening and balance activities. He has met both his 5x sit to stand STG which he did in 15 sec (goal 20 sec) and  his SLS LTG demonstrating ability to balance >15 sec bil. He still has signfificant difficulty getting up from the floor. He had some knee pain today which limited heel taps and eccentric quad work. He will continue to benefit from skilled PT for further LE strengthening and high level balance work in order to meet his LTGs and be able to RTW.    Examination-Activity Limitations Lift;Transfers;Sit;Squat    Examination-Participation Restrictions Occupation;Community Activity;Driving    PT Frequency 2x / week    PT Duration 12 weeks    PT Treatment/Interventions ADLs/Self Care Home Management;Cryotherapy;Electrical Stimulation;Moist Heat;Traction;Gait  training;Stair  training;Therapeutic activities;Therapeutic exercise;Balance training;Neuromuscular re-education;Patient/family education;Manual techniques;Taping    PT Next Visit Plan Work on standing split squat to help with floor transfer; high level balance; progress leg press and eccentric quads as tolerated.    PT Home Exercise Plan Access Code: [email or web address]    Consulted and Agree with Plan of Care Patient           Patient will benefit from skilled therapeutic intervention in order to improve the following deficits and impairments:  Pain,Postural dysfunction,Decreased strength,Difficulty walking,Decreased balance,Abnormal gait,Decreased endurance  Visit Diagnosis: Muscle weakness (generalized)  Difficulty in walking, not elsewhere classified  Physical deconditioning     Problem List Patient Active Problem List   Diagnosis Date Noted  . Acute pulmonary embolism ([place]) 10/18/2020  . Groin pain, right 08/31/2020  . Chronic viral hepatitis B without delta-agent ([place]) 08/21/2020  . Amyloidosis ([place]) 08/21/2020  . Anasarca 08/04/2020  . Nephrotic syndrome 08/03/2020  . AKI (acute kidney injury) ([place]) 08/03/2020  . Hypokalemia 06/27/2020  . Claudication in peripheral vascular disease ([place]) 06/26/2020  . Hypertension associated with diabetes ([place]) 05/25/2020  . Peripheral arterial disease ([place]) 03/08/2020  . Vitamin D deficiency 10/26/2019  . Hypoalbuminemia 10/26/2019  . IBD (inflammatory bowel disease) 04/05/2016  . Tobacco use disorder 03/28/2015  . Non-insulin treated type 2 diabetes mellitus ([place]) 01/25/2014  . Essential hypertension, benign 04/16/2013  . Hyperlipemia 04/16/2013  . Gout 04/16/2013    Madelyn Flavors PT 01/23/2021, 12:28 PM  [facility] [address] [place], [place], [address] Phone: [phone]   Fax:  [phone]  Name: Samuel Butler MRN: [id number] Date of Birth: 05/05/1961

## 2021-01-24 LAB — GI PROFILE, STOOL, PCR

## 2021-01-24 NOTE — Telephone Encounter (Signed)
Spoke to patients wife, Joseph Art.  She will have Al call us back

## 2021-01-25 ENCOUNTER — Other Ambulatory Visit: Payer: Self-pay

## 2021-01-25 ENCOUNTER — Ambulatory Visit: Payer: BC Managed Care – PPO

## 2021-01-25 DIAGNOSIS — R262 Difficulty in walking, not elsewhere classified: Secondary | ICD-10-CM

## 2021-01-25 DIAGNOSIS — R5381 Other malaise: Secondary | ICD-10-CM

## 2021-01-25 DIAGNOSIS — M6281 Muscle weakness (generalized): Secondary | ICD-10-CM

## 2021-01-25 NOTE — Therapy (Signed)
[facility] [address], [place], [place], [address] Phone: [phone]   Fax:  [phone]  Physical Therapy Treatment  Patient Details  Name: Samuel Butler MRN: [id number] Date of Birth: [birth year] Referring Provider (PT): Isaac Bliss, Rayford Halsted, MD   Encounter Date: 01/25/2021   PT End of Session - 01/25/21 1313    Visit Number 5    Date for PT Re-Evaluation 03/20/21    Authorization Type BCBS    PT Start Time 1231    PT Stop Time 1310    PT Time Calculation (min) 39 min    Activity Tolerance Patient tolerated treatment well    Behavior During Therapy [facility] for tasks assessed/performed           Past Medical History:  Diagnosis Date  . DDD (degenerative disc disease), lumbar    per his report  . Diabetes mellitus without complication ([place])   . Gout 04/16/2013  . Hyperlipidemia   . Hypertension   . IBD (inflammatory bowel disease) 04/05/2016  . Pulmonary embolism ([place])   . Tobacco use disorder 03/28/2015    Past Surgical History:  Procedure Laterality Date  . ABDOMINAL AORTOGRAM W/LOWER EXTREMITY Right 06/26/2020   Procedure: ABDOMINAL AORTOGRAM W/LOWER EXTREMITY;  Surgeon: Lorretta Harp, MD;  Location: [place] [facility];  Service: Cardiovascular;  Laterality: Right;  . COLONOSCOPY    . PERIPHERAL VASCULAR INTERVENTION  06/26/2020   Procedure: PERIPHERAL VASCULAR INTERVENTION;  Surgeon: Lorretta Harp, MD;  Location: [place] [facility];  Service: Cardiovascular;;  Right SFA  . TONSILLECTOMY      There were no vitals filed for this visit.   Subjective Assessment - 01/25/21 1231    Subjective I was here 2 days ago and I was pretty tired.  I rested yesterday.  It felt like the right amount of work.    Currently in Pain? No/denies                             [facility] Adult PT Treatment/Exercise - 01/25/21 0001      Therapeutic Activites    Therapeutic Activities Other  Therapeutic Activities    Other Therapeutic Activities floor to stand transfer onto pad at side of mat table      Knee/Hip Exercises: Aerobic   Nustep L2 x 6 minutes- PT present to monitor      Knee/Hip Exercises: Machines for Strengthening   Cybex Leg Press 80# seat 8  2x10, Rt LE 40# Rt and Lt 2x10      Knee/Hip Exercises: Standing   Hip Flexion Both;2 sets;5 sets    Hip Flexion Limitations red loop around thighs    Hip Abduction Both;4 sets;10 reps    Abduction Limitations walking red loop; then monster walk 2x 10    Lateral Step Up 10 reps;Both;Hand Hold: 1;Step Height: 4"    Forward Step Up 10 reps;Both;Hand Hold: 1;Step Height: 4"    Functional Squat 10 reps   fatigue with this              Balance Exercises - 01/25/21 0001      Balance Exercises: Standing   Partial Tandem Stance Foam/compliant surface;2 reps;20 secs   Rt and LT              PT Short Term Goals - 01/23/21 1022      PT SHORT TERM GOAL #1   Title 5 x  sit to stand in 20 seconds or less    Baseline 15.09 seconds    Status Achieved      PT SHORT TERM GOAL #2   Title Pt will be able to go up and down 4 steps reciprocally 3 x using one handrail due to improved strength    Status On-going             PT Long Term Goals - 01/23/21 1208      PT LONG TERM GOAL #2   Title Be able to get up and down off the floor without assistance    Baseline min assist and UE support    Status On-going      PT LONG TERM GOAL #4   Title Pt will be able to perform single leg stand for at least10 seconds for reduced risk of falls    Status Achieved                 Plan - 01/25/21 1236    Clinical Impression Statement Patient presented today with reduced knee pain. He was able to tolerate increased activity today both with strengthening and balance activities. Pt did well with single leg press 40# each.   He has met both his 5x sit to stand STG which he did in 15 sec (goal 20 sec) and his SLS LTG this  week.  He still has significant difficulty getting up from the floor which he will need to be able to do.  Pt becomes short of breath and required short rest breaks between exercises.  He will continue to benefit from skilled PT for further LE strengthening and high level balance work in order to meet his LTGs and improve function.    PT Frequency 2x / week    PT Duration 12 weeks    PT Treatment/Interventions ADLs/Self Care Home Management;Cryotherapy;Electrical Stimulation;Moist Heat;Traction;Gait Scientist, forensic;Therapeutic activities;Therapeutic exercise;Balance training;Neuromuscular re-education;Patient/family education;Manual techniques;Taping    PT Next Visit Plan Work on standing split squat to help with floor transfer; high level balance; progress leg press and eccentric quads as tolerated.    PT Home Exercise Plan Access Code: [email or web address]    Consulted and Agree with Plan of Care Patient           Patient will benefit from skilled therapeutic intervention in order to improve the following deficits and impairments:  Pain,Postural dysfunction,Decreased strength,Difficulty walking,Decreased balance,Abnormal gait,Decreased endurance  Visit Diagnosis: Muscle weakness (generalized)  Difficulty in walking, not elsewhere classified  Physical deconditioning     Problem List Patient Active Problem List   Diagnosis Date Noted  . Acute pulmonary embolism ([place]) 10/18/2020  . Groin pain, right 08/31/2020  . Chronic viral hepatitis B without delta-agent ([place]) 08/21/2020  . Amyloidosis ([place]) 08/21/2020  . Anasarca 08/04/2020  . Nephrotic syndrome 08/03/2020  . AKI (acute kidney injury) ([place]) 08/03/2020  . Hypokalemia 06/27/2020  . Claudication in peripheral vascular disease ([place]) 06/26/2020  . Hypertension associated with diabetes ([place]) 05/25/2020  . Peripheral arterial disease ([place]) 03/08/2020  . Vitamin D deficiency 10/26/2019  . Hypoalbuminemia 10/26/2019  . IBD  (inflammatory bowel disease) 04/05/2016  . Tobacco use disorder 03/28/2015  . Non-insulin treated type 2 diabetes mellitus ([place]) 01/25/2014  . Essential hypertension, benign 04/16/2013  . Hyperlipemia 04/16/2013  . Gout 04/16/2013   Sigurd Sos, PT 01/25/21 1:15 PM  [facility] [address], [place], [place], [address] Phone: [phone]   Fax:  [phone]  Name: Cecil A  Arutyunyan MRN: [id number] Date of Birth: Jul 24, 1961

## 2021-01-26 ENCOUNTER — Other Ambulatory Visit: Payer: Self-pay

## 2021-01-26 ENCOUNTER — Inpatient Hospital Stay: Payer: BC Managed Care – PPO

## 2021-01-26 ENCOUNTER — Encounter: Payer: BC Managed Care – PPO | Admitting: Dietician

## 2021-01-26 ENCOUNTER — Other Ambulatory Visit: Payer: Self-pay | Admitting: Hematology and Oncology

## 2021-01-26 VITALS — BP 125/93 | HR 88 | Temp 98.5°F | Resp 18 | Wt 213.5 lb

## 2021-01-26 DIAGNOSIS — E8581 Light chain (AL) amyloidosis: Secondary | ICD-10-CM

## 2021-01-26 DIAGNOSIS — Z5112 Encounter for antineoplastic immunotherapy: Secondary | ICD-10-CM | POA: Diagnosis not present

## 2021-01-26 LAB — CMP (CANCER CENTER ONLY)
ALT: 44 U/L (ref 0–44)
AST: 34 U/L (ref 15–41)
Albumin: 1.5 g/dL — ABNORMAL LOW (ref 3.5–5.0)
Alkaline Phosphatase: 71 U/L (ref 38–126)
Anion gap: 7 (ref 5–15)
BUN: 18 mg/dL (ref 8–23)
CO2: 28 mmol/L (ref 22–32)
Calcium: 7.6 mg/dL — ABNORMAL LOW (ref 8.9–10.3)
Chloride: 108 mmol/L (ref 98–111)
Creatinine: 0.98 mg/dL (ref 0.61–1.24)
GFR, Estimated: >60 mL/min (ref >60)
Glucose, Bld: 85 mg/dL (ref 70–99)
Potassium: 3.4 mmol/L — ABNORMAL LOW (ref 3.5–5.1)
Sodium: 143 mmol/L (ref 135–145)
Total Bilirubin: 0.5 mg/dL (ref 0.3–1.2)
Total Protein: 4 g/dL — ABNORMAL LOW (ref 6.5–8.1)

## 2021-01-26 LAB — CBC WITH DIFFERENTIAL (CANCER CENTER ONLY)
Abs Immature Granulocytes: 0.01 10*3/uL (ref 0.00–0.07)
Basophils Absolute: 0 10*3/uL (ref 0.0–0.1)
Basophils Relative: 0 %
Eosinophils Absolute: 0.1 10*3/uL (ref 0.0–0.5)
Eosinophils Relative: 3 %
HCT: 33.1 % — ABNORMAL LOW (ref 39.0–52.0)
Hemoglobin: 11 g/dL — ABNORMAL LOW (ref 13.0–17.0)
Immature Granulocytes: 0 %
Lymphocytes Relative: 28 %
Lymphs Abs: 1 10*3/uL (ref 0.7–4.0)
MCH: 32.4 pg (ref 26.0–34.0)
MCHC: 33.2 g/dL (ref 30.0–36.0)
MCV: 97.6 fL (ref 80.0–100.0)
Monocytes Absolute: 0.4 10*3/uL (ref 0.1–1.0)
Monocytes Relative: 10 %
Neutro Abs: 2.1 10*3/uL (ref 1.7–7.7)
Neutrophils Relative %: 59 %
Platelet Count: 125 10*3/uL — ABNORMAL LOW (ref 150–400)
RBC: 3.39 MIL/uL — ABNORMAL LOW (ref 4.22–5.81)
RDW: 15.9 % — ABNORMAL HIGH (ref 11.5–15.5)
WBC Count: 3.6 10*3/uL — ABNORMAL LOW (ref 4.0–10.5)
nRBC: 0 % (ref 0.0–0.2)

## 2021-01-26 LAB — LACTATE DEHYDROGENASE: LDH: 278 U/L — ABNORMAL HIGH (ref 98–192)

## 2021-01-26 MED ORDER — ACETAMINOPHEN 325 MG PO TABS
650.0000 mg | ORAL_TABLET | Freq: Once | ORAL | Status: AC
Start: 2021-01-26 — End: 2021-01-26
  Administered 2021-01-26: 650 mg via ORAL

## 2021-01-26 MED ORDER — SODIUM CHLORIDE 0.9 % IV SOLN
300.0000 mg/m2 | Freq: Once | INTRAVENOUS | Status: AC
  Administered 2021-01-26: 680 mg via INTRAVENOUS
  Filled 2021-01-26: qty 34

## 2021-01-26 MED ORDER — DEXAMETHASONE 4 MG PO TABS
40.0000 mg | ORAL_TABLET | Freq: Once | ORAL | Status: AC
  Administered 2021-01-26: 40 mg via ORAL

## 2021-01-26 MED ORDER — DIPHENHYDRAMINE HCL 25 MG PO CAPS
ORAL_CAPSULE | ORAL | Status: AC
  Filled 2021-01-26: qty 2

## 2021-01-26 MED ORDER — ACETAMINOPHEN 325 MG PO TABS
ORAL_TABLET | ORAL | Status: AC
  Filled 2021-01-26: qty 2

## 2021-01-26 MED ORDER — DARATUMUMAB-HYALURONIDASE-FIHJ 1800-30000 MG-UT/15ML ~~LOC~~ SOLN
1800.0000 mg | Freq: Once | SUBCUTANEOUS | Status: AC
  Administered 2021-01-26: 1800 mg via SUBCUTANEOUS
  Filled 2021-01-26: qty 15

## 2021-01-26 MED ORDER — DEXAMETHASONE 4 MG PO TABS
ORAL_TABLET | ORAL | Status: AC
  Filled 2021-01-26: qty 10

## 2021-01-26 MED ORDER — SODIUM CHLORIDE 0.9 % IV SOLN
Freq: Once | INTRAVENOUS | Status: AC
  Filled 2021-01-26: qty 250

## 2021-01-26 MED ORDER — BORTEZOMIB CHEMO SQ INJECTION 3.5 MG (2.5MG/ML)
1.3000 mg/m2 | Freq: Once | INTRAMUSCULAR | Status: AC
  Administered 2021-01-26: 3 mg via SUBCUTANEOUS
  Filled 2021-01-26: qty 1.2

## 2021-01-26 MED ORDER — DIPHENHYDRAMINE HCL 25 MG PO CAPS
50.0000 mg | ORAL_CAPSULE | Freq: Once | ORAL | Status: AC
  Administered 2021-01-26: 50 mg via ORAL

## 2021-01-26 NOTE — Patient Instructions (Signed)
Cancer Center Discharge Instructions for Patients Receiving Chemotherapy  Today you received the following chemotherapy agents Cyclophosphamide (CYTOXAN), Velcade , Darzalex Faspro.   To help prevent nausea and vomiting after your treatment, we encourage you to take your nausea medication as prescribed.   If you develop nausea and vomiting that is not controlled by your nausea medication, call the clinic.   BELOW ARE SYMPTOMS THAT SHOULD BE REPORTED IMMEDIATELY:  *FEVER GREATER THAN 100.5 F  *CHILLS WITH OR WITHOUT FEVER  NAUSEA AND VOMITING THAT IS NOT CONTROLLED WITH YOUR NAUSEA MEDICATION  *UNUSUAL SHORTNESS OF BREATH  *UNUSUAL BRUISING OR BLEEDING  TENDERNESS IN MOUTH AND THROAT WITH OR WITHOUT PRESENCE OF ULCERS  *URINARY PROBLEMS  *BOWEL PROBLEMS  UNUSUAL RASH Items with * indicate a potential emergency and should be followed up as soon as possible.  Feel free to call the clinic should you have any questions or concerns. The clinic phone number is [phone].  Please show the CHEMO ALERT CARD at check-in to the Emergency Department and triage nurse.   

## 2021-01-29 NOTE — Telephone Encounter (Signed)
Dr. Isaac Bliss, please comment if patient can come off Eliquis 2 days prior to procedure. Thanks

## 2021-01-30 ENCOUNTER — Telehealth: Payer: Self-pay | Admitting: Dietician

## 2021-01-30 NOTE — Telephone Encounter (Signed)
If in May, this should be ok. I thought it was scheduled for this month.  Thanks.

## 2021-01-30 NOTE — Telephone Encounter (Signed)
Nutrition Assessment   Reason for Assessment: MST    ASSESSMENT: 60 year old male with AL amyloidosis of kidney receiving chemotherapy, followed by Dr. Lorenso Courier. Patient is s/p Day 1, Cycle 5 Dara-Cy-BorD on 3/18  Past medical history of DDD, DM2, Gout, HLD, HTN, IBD, PE.  Nutrition assessment completed via telephone this afternoon. Patient reports his appetite comes and goes, very poor 1-2 days after treatment. He is drinking 5-6  (8oz) bottles of water daily and tries to eat small meals. He reports altered taste, finding foods bland no matter what he eats. Patient reports ongoing diarrhea, some relief with Imodium but occasionally causes significant cramping. He reports having continuous stomach cramping/tightness for the past few weeks. He is scheduled for EGD on April 4/8.    Nutrition Focused Physical Exam: unable to complete   Medications: Bently, Imdur, Metformin, Protonix, Klor-con, Demadex, Drisdol, Imodium   Labs: 3/18- Glucose 85, K 3.4, Albumin 1.5,  WBC 3.6   Anthropometrics: Weight 213 lb 8 oz on 3/18 decreased  23 lbs from usual 235 lbs in the last 3 months (9.8%) significant  Height: 5'11" Weight: 96.8 kg (3/17) UBW: 235 lbs (12/14) BMI: 29.78  3/11 - 211 lb 9.6 oz 2/25 - 217 lb 9.6 oz 2/15 - 224 lb 14.4 oz  1/21 - 231 lb 4.8 oz   NUTRITION DIAGNOSIS: Unintentional weight loss related to cancer and related treatments as evidenced by diarrhea and 9.8% weight loss in 3 month   INTERVENTION:  Discussed strategies for altered taste Encouraged high calorie, high protein small, frequent meals Educated on foods to best tolerated when having diarrhea Educated on the importance of hydration Discussed trying half dose of Imodium  Fact sheets mailed to patient  RD contact information provided  MONITORING, EVALUATION, GOAL: Patient will tolerate adequate calories and protein to minimize weight loss   Next Visit: Friday April 8 during infusion

## 2021-01-30 NOTE — Telephone Encounter (Signed)
Spoke with patient and informed him per Cardiology he can hold Plavix 5 days prior to procedure. Also, informed patient that per his PCP he can hold Eliquis 2 days prior to his procedure on 03/28/21. Patient verbalized understanding.

## 2021-01-30 NOTE — Telephone Encounter (Signed)
I would not recommend coming off Eliquis approx 3 months out from acute PE. Is this a screening c-scope that can be deferred? If patient decides to proceed, would recommend a shared-decision process to discuss possible effects of coming off Eliquis.

## 2021-01-30 NOTE — Telephone Encounter (Signed)
Please schedule colonoscopy and EGD so they are after 3 months out from his PE.

## 2021-01-30 NOTE — Telephone Encounter (Signed)
Please see Dr. Silvio Pate response. Patient is scheduled for 03/28/21. Will you give clearance to come off Eliquis x 2 days with the procedure being scheduled in May?

## 2021-01-31 ENCOUNTER — Telehealth: Payer: Self-pay | Admitting: Hematology and Oncology

## 2021-01-31 NOTE — Telephone Encounter (Signed)
Called and spoke with patient about change in appts for this week. Patient confirmed he will be her for 3/24 and 3/25

## 2021-02-01 ENCOUNTER — Other Ambulatory Visit: Payer: Self-pay

## 2021-02-01 ENCOUNTER — Encounter: Payer: Self-pay | Admitting: Hematology and Oncology

## 2021-02-01 ENCOUNTER — Inpatient Hospital Stay (HOSPITAL_BASED_OUTPATIENT_CLINIC_OR_DEPARTMENT_OTHER): Payer: BC Managed Care – PPO | Admitting: Hematology and Oncology

## 2021-02-01 ENCOUNTER — Ambulatory Visit: Payer: BC Managed Care – PPO

## 2021-02-01 ENCOUNTER — Inpatient Hospital Stay: Payer: BC Managed Care – PPO

## 2021-02-01 VITALS — BP 126/88 | HR 102 | Temp 97.0°F | Resp 15 | Ht 71.0 in | Wt 214.2 lb

## 2021-02-01 DIAGNOSIS — R5381 Other malaise: Secondary | ICD-10-CM

## 2021-02-01 DIAGNOSIS — E854 Organ-limited amyloidosis: Secondary | ICD-10-CM

## 2021-02-01 DIAGNOSIS — N049 Nephrotic syndrome with unspecified morphologic changes: Secondary | ICD-10-CM | POA: Diagnosis not present

## 2021-02-01 DIAGNOSIS — M6281 Muscle weakness (generalized): Secondary | ICD-10-CM

## 2021-02-01 DIAGNOSIS — E8581 Light chain (AL) amyloidosis: Secondary | ICD-10-CM

## 2021-02-01 DIAGNOSIS — R262 Difficulty in walking, not elsewhere classified: Secondary | ICD-10-CM

## 2021-02-01 DIAGNOSIS — Z5112 Encounter for antineoplastic immunotherapy: Secondary | ICD-10-CM | POA: Diagnosis not present

## 2021-02-01 DIAGNOSIS — B181 Chronic viral hepatitis B without delta-agent: Secondary | ICD-10-CM

## 2021-02-01 LAB — CBC WITH DIFFERENTIAL (CANCER CENTER ONLY)
Abs Immature Granulocytes: 0.02 10*3/uL (ref 0.00–0.07)
Basophils Absolute: 0 10*3/uL (ref 0.0–0.1)
Basophils Relative: 0 %
Eosinophils Absolute: 0.1 10*3/uL (ref 0.0–0.5)
Eosinophils Relative: 3 %
HCT: 33.7 % — ABNORMAL LOW (ref 39.0–52.0)
Hemoglobin: 11.4 g/dL — ABNORMAL LOW (ref 13.0–17.0)
Immature Granulocytes: 0 %
Lymphocytes Relative: 21 %
Lymphs Abs: 0.9 10*3/uL (ref 0.7–4.0)
MCH: 33 pg (ref 26.0–34.0)
MCHC: 33.8 g/dL (ref 30.0–36.0)
MCV: 97.7 fL (ref 80.0–100.0)
Monocytes Absolute: 0.4 10*3/uL (ref 0.1–1.0)
Monocytes Relative: 9 %
Neutro Abs: 3 10*3/uL (ref 1.7–7.7)
Neutrophils Relative %: 67 %
Platelet Count: 104 10*3/uL — ABNORMAL LOW (ref 150–400)
RBC: 3.45 MIL/uL — ABNORMAL LOW (ref 4.22–5.81)
RDW: 15.3 % (ref 11.5–15.5)
WBC Count: 4.5 10*3/uL (ref 4.0–10.5)
nRBC: 0 % (ref 0.0–0.2)

## 2021-02-01 LAB — CMP (CANCER CENTER ONLY)
ALT: 33 U/L (ref 0–44)
AST: 21 U/L (ref 15–41)
Albumin: 1.6 g/dL — ABNORMAL LOW (ref 3.5–5.0)
Alkaline Phosphatase: 71 U/L (ref 38–126)
Anion gap: 9 (ref 5–15)
BUN: 24 mg/dL — ABNORMAL HIGH (ref 8–23)
CO2: 28 mmol/L (ref 22–32)
Calcium: 7.4 mg/dL — ABNORMAL LOW (ref 8.9–10.3)
Chloride: 108 mmol/L (ref 98–111)
Creatinine: 1.01 mg/dL (ref 0.61–1.24)
GFR, Estimated: >60 mL/min (ref >60)
Glucose, Bld: 89 mg/dL (ref 70–99)
Potassium: 3.5 mmol/L (ref 3.5–5.1)
Sodium: 145 mmol/L (ref 135–145)
Total Bilirubin: 0.4 mg/dL (ref 0.3–1.2)
Total Protein: 4 g/dL — ABNORMAL LOW (ref 6.5–8.1)

## 2021-02-01 LAB — LACTATE DEHYDROGENASE: LDH: 268 U/L — ABNORMAL HIGH (ref 98–192)

## 2021-02-01 NOTE — Progress Notes (Signed)
[place] [phone]   [phone]  PROGRESS NOTE  Patient Care Team: Samuel Butler, Rayford Halsted, MD as PCP - General (Internal Medicine) Samuel Harp, MD as PCP - Cardiology (Cardiology)  Hematological/Oncological History # AL Amyloidosis of the Kidney 1) 08/03/2020: Free kappa 19.5, Lambda 205.7, Kappa/Lambda ratio: 0.09. M protein undetectable in serum.  2) 08/07/2020: kidney biopsy performed, results consistent with lambda light chain AL amyloidosis 3) 08/23/2020: establish care with Dr. [name]  4) 09/01/2020: bone marrow biopsy shows plasma cell neoplasm with focal amyloid deposits 5) 09/29/2020: start of Cycle 1 Day 1 of Dara-CyBorD 6) 11/02/2020: Cycle 2 Day 1 of Dara-CyBorD 7) 12/01/2020: Cycle 3 Day 1 of Dara-CyBorD 8) 12/29/2020: Cycle 4 Day 1 of Dara-CyBorD 9) 01/05/2021: Holding Velcade due to diarrhea.  10) 01/19/2021: Adding back Velcade due to improvement of diarrhea with imodium  Interval History:  Samuel Butler 60 y.o. male with medical history significant for AL amyloidosis of the kidney presents for a follow up visit.  He is doing well except for grade 1 diarrhea. He is not quite sure if the diarrhea can be attributed to the medication because according to him it may not have changed when he was off velcade. He says it happens couple times a day, more like immediately after he eats. He denies any fevers, abdominal pain, neuropathy symptoms No change in breathing. He has appointment with GI for colonoscopy mid  April for further management of diarrhea. He does report some palpiations intermittently Otherwise, frothy urine. Rest of the pertinent 10 point ROS reviewed and negative.  MEDICAL HISTORY:  Past Medical History:  Diagnosis Date  . DDD (degenerative disc disease), lumbar    per his report  . Diabetes mellitus without complication ([place])   . Gout 04/16/2013  . Hyperlipidemia   . Hypertension   . IBD (inflammatory  bowel disease) 04/05/2016  . Pulmonary embolism ([place])   . Tobacco use disorder 03/28/2015    SURGICAL HISTORY: Past Surgical History:  Procedure Laterality Date  . ABDOMINAL AORTOGRAM W/LOWER EXTREMITY Right 06/26/2020   Procedure: ABDOMINAL AORTOGRAM W/LOWER EXTREMITY;  Surgeon: Samuel Harp, MD;  Location: [facility];  Service: Cardiovascular;  Laterality: Right;  . COLONOSCOPY    . PERIPHERAL VASCULAR INTERVENTION  06/26/2020   Procedure: PERIPHERAL VASCULAR INTERVENTION;  Surgeon: Samuel Harp, MD;  Location: [facility];  Service: Cardiovascular;;  Right SFA  . TONSILLECTOMY      SOCIAL HISTORY: Social History   Socioeconomic History  . Marital status: Married    Spouse name: Not on file  . Number of children: Not on file  . Years of education: Not on file  . Highest education level: Not on file  Occupational History  . Not on file  Tobacco Use  . Smoking status: Former Smoker    Types: Cigarettes    Quit date: 10/23/2016    Years since quitting: 4.2  . Smokeless tobacco: Never Used  . Tobacco comment: per patient 5 cigarettes a day   Vaping Use  . Vaping Use: Never used  Substance and Sexual Activity  . Alcohol use: Not Currently    Comment: occasional  . Drug use: No  . Sexual activity: Not on file  Other Topics Concern  . 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 Difficulty of Paying Living Expenses: Somewhat hard  Food Insecurity: No Food Insecurity  . Worried About Charity fundraiser in the Last Year: Never true  . Ran Out of Food in the Last Year: Never true  Transportation Needs: No Transportation Needs  . Lack of Transportation (Medical): No  . Lack of Transportation  (Non-Medical): No  Physical Activity: Not on file  Stress: Not on file  Social Connections: Not on file  Intimate Partner Violence: Not on file    FAMILY HISTORY: Family History  Problem Relation Age of Onset  . Hypertension Father   . Diabetes Father   . Stroke Father   . Pancreatic cancer Father   . Colon cancer Brother   . Esophageal cancer Neg Hx   . Stomach cancer Neg Hx     ALLERGIES:  is allergic to crestor [rosuvastatin calcium].  MEDICATIONS:  Current Outpatient Medications  Medication Sig Dispense Refill  . acetaminophen (TYLENOL) 650 MG CR tablet Take 1,300 mg by mouth every 8 (eight) hours as needed for pain.     Marland Kitchen acyclovir (ZOVIRAX) 400 MG tablet Take 1 tablet (400 mg total) by mouth 2 (two) times daily. 60 tablet 3  . albuterol (VENTOLIN HFA) 108 (90 Base) MCG/ACT inhaler Inhale 2 puffs into the lungs every 6 (six) hours as needed for wheezing or shortness of breath. 8 g 0  . apixaban (ELIQUIS) 5 MG TABS tablet Take 1 tablet (5 mg total) by mouth 2 (two) times daily. 180 tablet 1  . atorvastatin (LIPITOR) 80 MG tablet Take 1 tablet (80 mg total) by mouth daily. 90 tablet 2  . clopidogrel (PLAVIX) 75 MG tablet Take 1 tablet (75 mg total) by mouth daily with breakfast. 90 tablet 3  . dicyclomine (BENTYL) 20 MG tablet Take 1 tablet (20 mg total) by mouth 4 (four) times daily -  before meals and at bedtime. Take 20-30 minutes before meals. 120 tablet 2  . Evolocumab (REPATHA SURECLICK) 086 MG/ML SOAJ Inject 140 mg into the skin every 14 (fourteen) days. 2 mL 12  . ezetimibe (ZETIA) 10 MG tablet Take 1 tablet (10 mg total) by mouth daily. 90 tablet 3  . isosorbide mononitrate (IMDUR) 30 MG 24 hr tablet Take 1 tablet by mouth once daily 90 tablet 1  . losartan (COZAAR) 50 MG tablet Take 1 tablet (50 mg total) by mouth daily. 90 tablet 3  . metFORMIN (GLUCOPHAGE) 500 MG tablet Take 1 tablet by mouth once daily with breakfast 90 tablet 1  . ondansetron (ZOFRAN) 8 MG tablet  TAKE 1 TABLET BY MOUTH EVERY 8 HOURS AS NEEDED FOR NAUSEA OR VOMITING 30 tablet 0  . pantoprazole (PROTONIX) 40 MG tablet Take 1 tablet (40 mg total) by mouth daily. 90 tablet 1  . potassium chloride SA (KLOR-CON) 20 MEQ tablet Take 2 tablets (40 mEq total) by mouth daily. 80MG x3 days then back to 40 mg/day 33 tablet 6  . torsemide (DEMADEX) 20 MG tablet Take 1 tablet (20 mg total) by mouth 2 (two) times daily. 49m BID x 3 days then back to 284mBID 33 tablet 6  . VEMLIDY 25 MG TABS Take 1 tablet (25 mg total) by mouth daily. 30 tablet 5  . Vitamin D, Ergocalciferol, (DRISDOL) 1.25 MG (50000 UNIT) CAPS capsule Take  1 capsule by mouth once a week 12 capsule 0   No current facility-administered medications for this visit.    REVIEW OF SYSTEMS:   Constitutional: ( - ) fevers, ( - )  chills , ( - ) night sweats Eyes: ( - ) blurriness of vision, ( - ) double vision, ( - ) watery eyes Ears, nose, mouth, throat, and face: ( - ) mucositis, ( - ) sore throat Respiratory: ( - ) cough, ( - ) dyspnea, ( - ) wheezes Cardiovascular: ( - ) palpitation, ( - ) chest discomfort, ( - ) lower extremity swelling Gastrointestinal:  ( - ) nausea, ( - ) heartburn Skin: ( - ) abnormal skin rashes Lymphatics: ( - ) new lymphadenopathy, ( - ) easy bruising Neurological: (+) numbness, ( - ) tingling, ( - ) new weaknesses Behavioral/Psych: ( - ) mood change, ( - ) new changes  All other systems were reviewed with the patient and are negative.  PHYSICAL EXAMINATION: ECOG PERFORMANCE STATUS: 1 - Symptomatic but completely ambulatory  Vitals:   02/01/21 1355  BP: 126/88  Pulse: (!) 102  Resp: 15  Temp: (!) 97 F (36.1 C)  SpO2: 98%   Filed Weights   02/01/21 1355  Weight: 214 lb 3.2 oz (97.2 kg)    GENERAL: well appearing middle aged [place] American male alert, no distress and comfortable SKIN: skin color, texture, turgor are normal, no rashes or significant lesions HEENT: No glossomegaly EYES:  conjunctiva are pink and non-injected, sclera clear LUNGS: clear to auscultation and percussion with normal breathing effort HEART: regular rate & rhythm and no murmurs and +2 lower extremity edema bilaterally (L > R) Musculoskeletal: no cyanosis of digits and no clubbing  PSYCH: alert & oriented x 3, fluent speech NEURO: no focal motor/sensory deficits  LABORATORY DATA:  I have reviewed the data as listed CBC Latest Ref Rng & Units 02/01/2021 01/26/2021 01/19/2021  WBC 4.0 - 10.5 K/uL 4.5 3.6(L) 4.7  Hemoglobin 13.0 - 17.0 g/dL 11.4(L) 11.0(L) 12.0(L)  Hematocrit 39.0 - 52.0 % 33.7(L) 33.1(L) 36.2(L)  Platelets 150 - 400 K/uL 104(L) 125(L) 176    CMP Latest Ref Rng & Units 01/26/2021 01/19/2021 01/12/2021  Glucose 70 - 99 mg/dL 85 95 86  BUN 8 - 23 mg/dL 18 20 15   Creatinine 0.61 - 1.24 mg/dL 0.98 1.02 0.96  Sodium 135 - 145 mmol/L 143 146(H) 146(H)  Potassium 3.5 - 5.1 mmol/L 3.4(L) 3.5 3.2(L)  Chloride 98 - 111 mmol/L 108 106 107  CO2 22 - 32 mmol/L 28 31 29   Calcium 8.9 - 10.3 mg/dL 7.6(L) 7.7(L) 7.7(L)  Total Protein 6.5 - 8.1 g/dL 4.0(L) 4.1(L) 4.2(L)  Total Bilirubin 0.3 - 1.2 mg/dL 0.5 0.5 0.7  Alkaline Phos 38 - 126 U/L 71 80 75  AST 15 - 41 U/L 34 26 22  ALT 0 - 44 U/L 44 29 26    Lab Results  Component Value Date   MPROTEIN Not Observed 01/12/2021   MPROTEIN 0.1 (H) 12/15/2020   MPROTEIN 0.1 (H) 11/17/2020   Lab Results  Component Value Date   KPAFRELGTCHN 11.7 01/12/2021   KPAFRELGTCHN 15.9 12/15/2020   KPAFRELGTCHN 12.1 11/17/2020   LAMBDASER 16.9 01/12/2021   LAMBDASER 21.7 12/15/2020   LAMBDASER 27.4 (H) 11/17/2020   KAPLAMBRATIO 0.69 01/12/2021   KAPLAMBRATIO 0.83 (L) 01/09/2021   KAPLAMBRATIO 0.73 12/15/2020    RADIOGRAPHIC STUDIES: No results found.  ASSESSMENT & PLAN Samuel Butler 60 y.o. male with medical  history significant for AL amyloidosis of the kidney on Dara CyBorD first line.  Dr Samuel Butler previously discussed the side effects of this  chemotherapy with the patient including neuropathy, elevated blood pressure, drop in blood counts, hypersensitivity reaction, chest tightness, increased infection risk, and fatigue.  The patient and family voiced their understanding of these findings and are agreeable to moving forward with quadruple therapy with the goal of being a bridge to bone marrow transplant.    The regimen of choice is daratumumab, bortezomib, cyclophosphamide and dexamethasone per the ANDROMEDA Study ( Blood. 2020 Jul 2;136(1):71-80). Treatment consists of: Cyclophosphamide 300 mg/m2 intravenously and bortezomib 1.3 mg/m2 subcutaneously were given on days 1, 8, 15, and 22 of each 28 day cycle for up to 6 cycles. Dexamethasone 40 mg (starting dose) was given orally or intravenously weekly for each cycle for up to 6 cycles. DARA [place] was administered in a single, premixed vial and given by manual [place] injection over the course of 3 to 5 minutes weekly in cycles 1 to 2, every 2 weeks in cycles 3 to 6, and every 4 weeks thereafter as monotherapy for a maximum of 2 years. We would consider this continued dosing regimen if patient declined or was determined not to be a candidate for BMT.   On exam today,  Samuel Butler is feeling stable, continues to have mild diarrhea.  Labs from 3/24 were reviewed without any intervention needed.   Ok to proceed with treatment as planned. Patient will return to the clinic in 2 weeks as scheduled.  #AL Amyloidosis Affecting the Kidney --bone marrow biopsy and kidney biopsy helped to confirm the diagnosis of AL amyloidosis. --currently being treated with  Dara-CyBorD (per the [facility] Trial), started Cycle 1 Day 1 on 09/29/2020.  --He is due for C5D1 of treatment.  --Labs reviewed with no concerns, Last amyloid labs overall stable, ongoing proteinuria which may be permanent depending on the nephron damage incurred before the diagnosis --will have patient return for weekly treatment with q 2 week clinic  visits initially.   #Diarrhea with abdominal cramping --Improved with Imodium and Bentyl. --Advised him to have a log of food and diarrhea frequency -- At this time this is grade 1? Amyloid involvement in GI tract -- Agree with colonoscopy, scheduled in mid April - Ok to proceed with treatment as planned.   #Weight Loss - Resolved.  #Injection Site Rash, no concerns today  # Palpitations, would consider cardiac evaluation for amyloid involvement if not already done since this could affect prognosis and possibility of transplant.  #Supportive Care, no changes. --zofran 18m q8H PRN and compazine 1108mPO q6H for nausea --acyclovir 40017mO BID for VCZ prophylaxis --albuterol HFA inhaler for daratumumab treatment -- EMLA cream for port not required, patient declines port at this time, but will reconsider once treatment starts -- no pain medication required at this time.   No orders of the defined types were placed in this encounter.   All questions were answered. The patient knows to call the clinic with any problems, questions or concerns.  A total of more than 30 minutes were spent on this encounter and over half of that time was spent on counseling and coordination of care as outlined above.    Literature Support:   V, Clarita Craneasey SY, Weiss BM, VerRush Landmarkerlini G, Comenzo RL. Daratumumab plus CyBorD for patients with newly diagnosed AL amyloidosis: safety run-in results of ANDROMEDA. Blood. 2020 Jul 2;136(1):71-80.   --Daratumumab-CyBorD was well  tolerated, with no new safety concerns versus the intravenous formulation, and demonstrated robust hematologic and organ responses.

## 2021-02-01 NOTE — Therapy (Signed)
[facility] [address], [place], [place], [address] Phone: [phone]   Fax:  [phone]  Physical Therapy Treatment  Patient Details  Name: Samuel Butler MRN: [id number] Date of Birth: [birth year] Referring Provider (PT): Isaac Bliss, Rayford Halsted, MD   Encounter Date: 02/01/2021   PT End of Session - 02/01/21 1309    Visit Number 6    Date for PT Re-Evaluation 03/20/21    Authorization Type BCBS    PT Start Time 1232    PT Stop Time 1308   fatigue and pt needed to get to another appt   PT Time Calculation (min) 36 min    Activity Tolerance Patient tolerated treatment well    Behavior During Therapy [facility] for tasks assessed/performed           Past Medical History:  Diagnosis Date  . DDD (degenerative disc disease), lumbar    per his report  . Diabetes mellitus without complication ([place])   . Gout 04/16/2013  . Hyperlipidemia   . Hypertension   . IBD (inflammatory bowel disease) 04/05/2016  . Pulmonary embolism ([place])   . Tobacco use disorder 03/28/2015    Past Surgical History:  Procedure Laterality Date  . ABDOMINAL AORTOGRAM W/LOWER EXTREMITY Right 06/26/2020   Procedure: ABDOMINAL AORTOGRAM W/LOWER EXTREMITY;  Surgeon: Lorretta Harp, MD;  Location: [place];  Service: Cardiovascular;  Laterality: Right;  . COLONOSCOPY    . PERIPHERAL VASCULAR INTERVENTION  06/26/2020   Procedure: PERIPHERAL VASCULAR INTERVENTION;  Surgeon: Lorretta Harp, MD;  Location: [facility];  Service: Cardiovascular;;  Right SFA  . TONSILLECTOMY      There were no vitals filed for this visit.   Subjective Assessment - 02/01/21 1234    Subjective I had to cancel on Tuesday because I was sore and tired.  My back is hurting today.    Currently in Pain? Yes    Pain Score 6     Pain Location Back    Pain Orientation Right;Left    Pain Descriptors / Indicators Aching    Pain Onset More than a month ago     Pain Frequency Intermittent    Aggravating Factors  standing and walking    Pain Relieving Factors lying down                             [facility] Adult PT Treatment/Exercise - 02/01/21 0001      Exercises   Exercises Shoulder      Knee/Hip Exercises: Aerobic   Nustep L2 x 8 minutes- PT present to monitor      Knee/Hip Exercises: Machines for Strengthening   Cybex Leg Press 65# seat 8  2x10, Rt LE 30# Rt and Lt 2x10   reduced weight today due to fatigue     Knee/Hip Exercises: Seated   Ball Squeeze 2x10    Clamshell with TheraBand Yellow   yellow loop 2x20   Marching Strengthening;Both;2 sets;10 reps;Weights    Marching Weights 2 lbs.    Sit to Sand 2 sets;5 reps   1 set with 5# kettlebell, 1 set without and required UE support     Shoulder Exercises: Seated   Flexion Strengthening;Both;Weights    Flexion Weight (lbs) 2    Flexion Limitations 3x10    Other Seated Exercises biceps curls 3# 2x10  PT Short Term Goals - 01/23/21 1022      PT SHORT TERM GOAL #1   Title 5 x sit to stand in 20 seconds or less    Baseline 15.09 seconds    Status Achieved      PT SHORT TERM GOAL #2   Title Pt will be able to go up and down 4 steps reciprocally 3 x using one handrail due to improved strength    Status On-going             PT Long Term Goals - 01/23/21 1208      PT LONG TERM GOAL #2   Title Be able to get up and down off the floor without assistance    Baseline min assist and UE support    Status On-going      PT LONG TERM GOAL #4   Title Pt will be able to perform single leg stand for at least10 seconds for reduced risk of falls    Status Achieved                 Plan - 02/01/21 1300    Clinical Impression Statement Pt arrived with LBP and mild shortness of breath with activity. Pt canceled earlier this week due to fatigue and widespread soreness.  Exercises were modified today due to fatigue and weakness due to  chemo treatments. PT monitored pt closely during exercise for fatigue and made modifications to exercises as indicated.   Pt becomes short of breath and required short rest breaks between exercises.  He will continue to benefit from skilled PT for further LE strengthening and high level balance work in order to meet his LTGs and improve function.    PT Frequency 2x / week    PT Duration 12 weeks    PT Treatment/Interventions ADLs/Self Care Home Management;Cryotherapy;Electrical Stimulation;Moist Heat;Traction;Gait Scientist, forensic;Therapeutic activities;Therapeutic exercise;Balance training;Neuromuscular re-education;Patient/family education;Manual techniques;Taping    PT Next Visit Plan balance, strength and balance progression as pt tolerates    PT Home Exercise Plan Access Code: [email or web address]    Consulted and Agree with Plan of Care Patient           Patient will benefit from skilled therapeutic intervention in order to improve the following deficits and impairments:  Pain,Postural dysfunction,Decreased strength,Difficulty walking,Decreased balance,Abnormal gait,Decreased endurance  Visit Diagnosis: Muscle weakness (generalized)  Difficulty in walking, not elsewhere classified  Physical deconditioning     Problem List Patient Active Problem List   Diagnosis Date Noted  . Acute pulmonary embolism ([place]) 10/18/2020  . Groin pain, right 08/31/2020  . Chronic viral hepatitis B without delta-agent ([place]) 08/21/2020  . Amyloidosis ([place]) 08/21/2020  . Anasarca 08/04/2020  . Nephrotic syndrome 08/03/2020  . AKI (acute kidney injury) ([place]) 08/03/2020  . Hypokalemia 06/27/2020  . Claudication in peripheral vascular disease ([place]) 06/26/2020  . Hypertension associated with diabetes ([place]) 05/25/2020  . Peripheral arterial disease ([place]) 03/08/2020  . Vitamin D deficiency 10/26/2019  . Hypoalbuminemia 10/26/2019  . IBD (inflammatory bowel disease) 04/05/2016  . Tobacco use disorder  03/28/2015  . Non-insulin treated type 2 diabetes mellitus ([place]) 01/25/2014  . Essential hypertension, benign 04/16/2013  . Hyperlipemia 04/16/2013  . Gout 04/16/2013     Sigurd Sos, PT 02/01/21 1:11 PM  [facility] [address] [place], [place], [address] Phone: [phone]   Fax:  [phone]  Name: Samuel Butler MRN: [id number] Date of Birth: [birth year]

## 2021-02-02 ENCOUNTER — Inpatient Hospital Stay: Payer: BC Managed Care – PPO

## 2021-02-02 ENCOUNTER — Encounter: Payer: Self-pay | Admitting: Hematology and Oncology

## 2021-02-02 ENCOUNTER — Other Ambulatory Visit: Payer: Self-pay

## 2021-02-02 ENCOUNTER — Other Ambulatory Visit: Payer: BC Managed Care – PPO

## 2021-02-02 ENCOUNTER — Ambulatory Visit: Payer: BC Managed Care – PPO | Admitting: Hematology and Oncology

## 2021-02-02 ENCOUNTER — Ambulatory Visit: Payer: BC Managed Care – PPO | Admitting: Physician Assistant

## 2021-02-02 VITALS — BP 130/90 | HR 94 | Temp 97.0°F | Resp 18

## 2021-02-02 DIAGNOSIS — B181 Chronic viral hepatitis B without delta-agent: Secondary | ICD-10-CM

## 2021-02-02 DIAGNOSIS — Z5112 Encounter for antineoplastic immunotherapy: Secondary | ICD-10-CM | POA: Diagnosis not present

## 2021-02-02 DIAGNOSIS — E8581 Light chain (AL) amyloidosis: Secondary | ICD-10-CM

## 2021-02-02 MED ORDER — DEXAMETHASONE 4 MG PO TABS
ORAL_TABLET | ORAL | Status: AC
  Filled 2021-02-02: qty 2

## 2021-02-02 MED ORDER — BORTEZOMIB CHEMO SQ INJECTION 3.5 MG (2.5MG/ML)
1.3000 mg/m2 | Freq: Once | INTRAMUSCULAR | Status: AC
  Administered 2021-02-02: 3 mg via SUBCUTANEOUS
  Filled 2021-02-02: qty 1.2

## 2021-02-02 MED ORDER — DEXAMETHASONE 4 MG PO TABS
40.0000 mg | ORAL_TABLET | Freq: Once | ORAL | Status: AC
  Administered 2021-02-02: 40 mg via ORAL

## 2021-02-02 MED ORDER — SODIUM CHLORIDE 0.9 % IV SOLN
300.0000 mg/m2 | Freq: Once | INTRAVENOUS | Status: AC
  Administered 2021-02-02: 680 mg via INTRAVENOUS
  Filled 2021-02-02: qty 34

## 2021-02-02 MED ORDER — DEXAMETHASONE 4 MG PO TABS
ORAL_TABLET | ORAL | Status: AC
  Filled 2021-02-02: qty 1

## 2021-02-02 NOTE — Patient Instructions (Signed)
[facility] Discharge Instructions for Patients Receiving Chemotherapy  Today you received the following chemotherapy agents: cyclophosphamide and bortezomib.  To help prevent nausea and vomiting after your treatment, we encourage you to take your nausea medication as directed.   If you develop nausea and vomiting that is not controlled by your nausea medication, call the clinic.   BELOW ARE SYMPTOMS THAT SHOULD BE REPORTED IMMEDIATELY:  *FEVER GREATER THAN 100.5 F  *CHILLS WITH OR WITHOUT FEVER  NAUSEA AND VOMITING THAT IS NOT CONTROLLED WITH YOUR NAUSEA MEDICATION  *UNUSUAL SHORTNESS OF BREATH  *UNUSUAL BRUISING OR BLEEDING  TENDERNESS IN MOUTH AND THROAT WITH OR WITHOUT PRESENCE OF ULCERS  *URINARY PROBLEMS  *BOWEL PROBLEMS  UNUSUAL RASH Items with * indicate a potential emergency and should be followed up as soon as possible.  Feel free to call the clinic should you have any questions or concerns. The clinic phone number is [phone].  Please show the [facility] at check-in to the Emergency Department and triage nurse.

## 2021-02-05 ENCOUNTER — Other Ambulatory Visit: Payer: Self-pay

## 2021-02-05 ENCOUNTER — Ambulatory Visit: Payer: BC Managed Care – PPO

## 2021-02-05 DIAGNOSIS — R5381 Other malaise: Secondary | ICD-10-CM

## 2021-02-05 DIAGNOSIS — M6281 Muscle weakness (generalized): Secondary | ICD-10-CM

## 2021-02-05 DIAGNOSIS — R262 Difficulty in walking, not elsewhere classified: Secondary | ICD-10-CM

## 2021-02-05 NOTE — Therapy (Signed)
[facility] [address] [place], [place], [address] Phone: [phone]   Fax:  [phone]  Physical Therapy Treatment  Patient Details  Name: Samuel Butler MRN: [id number] Date of Birth: 1960/11/19 Referring Provider (PT): Isaac Bliss, Rayford Halsted, MD   Encounter Date: 02/05/2021   PT End of Session - 02/05/21 1256    Visit Number 7    Date for PT Re-Evaluation 03/20/21    Authorization Type BCBS    PT Start Time 1230    PT Stop Time 1304    PT Time Calculation (min) 34 min    Activity Tolerance Patient limited by fatigue;Patient tolerated treatment well    Behavior During Therapy [facility] for tasks assessed/performed           Past Medical History:  Diagnosis Date  . DDD (degenerative disc disease), lumbar    per his report  . Diabetes mellitus without complication ([place])   . Gout 04/16/2013  . Hyperlipidemia   . Hypertension   . IBD (inflammatory bowel disease) 04/05/2016  . Pulmonary embolism ([place])   . Tobacco use disorder 03/28/2015    Past Surgical History:  Procedure Laterality Date  . ABDOMINAL AORTOGRAM W/LOWER EXTREMITY Right 06/26/2020   Procedure: ABDOMINAL AORTOGRAM W/LOWER EXTREMITY;  Surgeon: Lorretta Harp, MD;  Location: [place];  Service: Cardiovascular;  Laterality: Right;  . COLONOSCOPY    . PERIPHERAL VASCULAR INTERVENTION  06/26/2020   Procedure: PERIPHERAL VASCULAR INTERVENTION;  Surgeon: Lorretta Harp, MD;  Location: [place];  Service: Cardiovascular;;  Right SFA  . TONSILLECTOMY      There were no vitals filed for this visit.   Subjective Assessment - 02/05/21 1217    Subjective My back is feeling better.  Fatigue is still there but I don't hurt as much.    Currently in Pain? No/denies                             [facility] Adult PT Treatment/Exercise - 02/05/21 0001      Knee/Hip Exercises: Aerobic   Nustep L2 x 8 minutes- PT present  to monitor    Other Aerobic Arm bike: Level 1 x 4 minutes (2/2)      Knee/Hip Exercises: Machines for Strengthening   Cybex Leg Press 65# seat 8  2x10, Rt LE 30# Rt and Lt 2x10   reduced weight today due to fatigue     Knee/Hip Exercises: Standing   Forward Step Up 10 reps;Both;Hand Hold: 1;Step Height: 6"    Forward Step Up Limitations alternating step taps at edge of 6" step 2x10 bil each- no UE required      Knee/Hip Exercises: Seated   Ball Squeeze 2x10    Clamshell with TheraBand --    Sit to Sand 2 sets;5 reps   1 set with 5# kettlebell, 1 set without and required UE support     Shoulder Exercises: Seated   Flexion Weight (lbs) 2    Flexion Limitations 3 way raises with 2# 2x10                    PT Short Term Goals - 01/23/21 1022      PT SHORT TERM GOAL #1   Title 5 x sit to stand in 20 seconds or less    Baseline 15.09 seconds    Status Achieved      PT SHORT  TERM GOAL #2   Title Pt will be able to go up and down 4 steps reciprocally 3 x using one handrail due to improved strength    Status On-going             PT Long Term Goals - 01/23/21 1208      PT LONG TERM GOAL #2   Title Be able to get up and down off the floor without assistance    Baseline min assist and UE support    Status On-going      PT LONG TERM GOAL #4   Title Pt will be able to perform single leg stand for at least10 seconds for reduced risk of falls    Status Achieved                 Plan - 02/05/21 1240    Clinical Impression Statement LBP has resolved this week.   Exercises were modified somewhat today due to fatigue and weakness due to chemo treatments. PT monitored pt closely during exercise for fatigue and made modifications to exercises as indicated.   Pt had fewer episodes of shortness of breath today and did require intermittent rest breaks to resolve his fatigue especially with LE exercises.  He will continue to benefit from skilled PT for further LE  strengthening, endurance training and high-level balance work in order to meet his LTGs and improve function.    PT Frequency 2x / week    PT Duration 12 weeks    PT Treatment/Interventions ADLs/Self Care Home Management;Cryotherapy;Electrical Stimulation;Moist Heat;Traction;Gait Scientist, forensic;Therapeutic activities;Therapeutic exercise;Balance training;Neuromuscular re-education;Patient/family education;Manual techniques;Taping    PT Next Visit Plan balance, strength and balance progression as pt tolerates    PT Home Exercise Plan Access Code: [email or web address]    Consulted and Agree with Plan of Care Patient           Patient will benefit from skilled therapeutic intervention in order to improve the following deficits and impairments:  Pain,Postural dysfunction,Decreased strength,Difficulty walking,Decreased balance,Abnormal gait,Decreased endurance  Visit Diagnosis: Muscle weakness (generalized)  Difficulty in walking, not elsewhere classified  Physical deconditioning     Problem List Patient Active Problem List   Diagnosis Date Noted  . Acute pulmonary embolism ([place]) 10/18/2020  . Groin pain, right 08/31/2020  . Chronic viral hepatitis B without delta-agent ([place]) 08/21/2020  . Amyloidosis ([place]) 08/21/2020  . Anasarca 08/04/2020  . Nephrotic syndrome 08/03/2020  . AKI (acute kidney injury) ([place]) 08/03/2020  . Hypokalemia 06/27/2020  . Claudication in peripheral vascular disease ([place]) 06/26/2020  . Hypertension associated with diabetes ([place]) 05/25/2020  . Peripheral arterial disease ([place]) 03/08/2020  . Vitamin D deficiency 10/26/2019  . Hypoalbuminemia 10/26/2019  . IBD (inflammatory bowel disease) 04/05/2016  . Tobacco use disorder 03/28/2015  . Non-insulin treated type 2 diabetes mellitus ([place]) 01/25/2014  . Essential hypertension, benign 04/16/2013  . Hyperlipemia 04/16/2013  . Gout 04/16/2013     Sigurd Sos, PT 02/05/21 12:58 PM  [facility] [address] [place], [place], [address] Phone: [phone]   Fax:  [phone]  Name: Samuel Butler MRN: [id number] Date of Birth: 1961/01/10

## 2021-02-06 ENCOUNTER — Other Ambulatory Visit: Payer: BC Managed Care – PPO

## 2021-02-06 DIAGNOSIS — B181 Chronic viral hepatitis B without delta-agent: Secondary | ICD-10-CM

## 2021-02-08 ENCOUNTER — Other Ambulatory Visit: Payer: Self-pay

## 2021-02-08 ENCOUNTER — Ambulatory Visit: Payer: BC Managed Care – PPO

## 2021-02-08 DIAGNOSIS — M6281 Muscle weakness (generalized): Secondary | ICD-10-CM

## 2021-02-08 DIAGNOSIS — R5381 Other malaise: Secondary | ICD-10-CM

## 2021-02-08 DIAGNOSIS — R262 Difficulty in walking, not elsewhere classified: Secondary | ICD-10-CM

## 2021-02-08 NOTE — Therapy (Signed)
[facility] [address] [place], [place], [address] Phone: [phone]   Fax:  [phone]  Physical Therapy Treatment  Patient Details  Name: Samuel Butler MRN: [id number] Date of Birth: 1961-01-10 Referring Provider (Samuel Butler): Isaac Bliss, Rayford Halsted, MD   Encounter Date: 02/08/2021   Samuel Butler End of Session - 02/08/21 1257    Visit Number 8    Date for Samuel Butler Re-Evaluation 03/20/21    Authorization Type BCBS    Samuel Butler Start Time 1227    Samuel Butler Stop Time 1300    Samuel Butler Time Calculation (min) 33 min    Activity Tolerance Patient limited by fatigue;Patient tolerated treatment well    Behavior During Therapy [facility] for tasks assessed/performed           Past Medical History:  Diagnosis Date  . DDD (degenerative disc disease), lumbar    per his report  . Diabetes mellitus without complication ([place])   . Gout 04/16/2013  . Hyperlipidemia   . Hypertension   . IBD (inflammatory bowel disease) 04/05/2016  . Pulmonary embolism ([place])   . Tobacco use disorder 03/28/2015    Past Surgical History:  Procedure Laterality Date  . ABDOMINAL AORTOGRAM W/LOWER EXTREMITY Right 06/26/2020   Procedure: ABDOMINAL AORTOGRAM W/LOWER EXTREMITY;  Surgeon: Lorretta Harp, MD;  Location: [place] [facility];  Service: Cardiovascular;  Laterality: Right;  . COLONOSCOPY    . PERIPHERAL VASCULAR INTERVENTION  06/26/2020   Procedure: PERIPHERAL VASCULAR INTERVENTION;  Surgeon: Lorretta Harp, MD;  Location: [place] [facility];  Service: Cardiovascular;;  Right SFA  . TONSILLECTOMY      There were no vitals filed for this visit.   Subjective Assessment - 02/08/21 1228    Subjective I have been sore since last session.  I went home and mowed my yard so I probably need to rest when I get home.    Currently in Pain? No/denies              [facility] Samuel Butler Assessment - 02/08/21 0001      Transfers   Five time sit to stand comments  14.09 seconds-uncontrolled  descent final 2 reps                         [facility] Adult Samuel Butler Treatment/Exercise - 02/08/21 0001      Knee/Hip Exercises: Aerobic   Nustep L2 x 8 minutes- Samuel Butler present to monitor    Other Aerobic Arm bike: Level 1 x 4 minutes (2/2)      Knee/Hip Exercises: Machines for Strengthening   Cybex Leg Press 65# seat 8  2x10, Rt LE 30# Rt and Lt 2x10   reduced weight today due to fatigue     Knee/Hip Exercises: Standing   Forward Step Up Both;Hand Hold: 1;Step Height: 6";5 reps   LE fatigue today   Forward Step Up Limitations --      Knee/Hip Exercises: Seated   Sit to Sand 2 sets;5 reps      Shoulder Exercises: Seated   Flexion Weight (lbs) 2    Flexion Limitations 3 way raises with 2# 2x10    Other Seated Exercises biceps curls 3# 2x10                    Samuel Butler Short Term Goals - 02/08/21 1248      Samuel Butler SHORT TERM GOAL #1   Title 5 x sit to stand in 20 seconds  or less    Baseline 14.09    Status Achieved      Samuel Butler SHORT TERM GOAL #2   Title Samuel Butler will be able to go up and down 4 steps reciprocally 3 x using one handrail due to improved strength    Baseline ascending with 1 rail.  Descdending Samuel Butler demonstrated instability and required both rails    Status On-going             Samuel Butler Long Term Goals - 01/23/21 1208      Samuel Butler LONG TERM GOAL #2   Title Be able to get up and down off the floor without assistance    Baseline min assist and UE support    Status On-going      Samuel Butler LONG TERM GOAL #4   Title Samuel Butler will be able to perform single leg stand for at least10 seconds for reduced risk of falls    Status Achieved                 Plan - 02/08/21 1242    Clinical Impression Statement Samuel Butler reports soreness and fatigue since last session.  Samuel Butler did go home and mow his grass after treatment and agreed that he should rest after his therapy session.  Samuel Butler monitored Samuel Butler closely during exercise for fatigue and made modifications to exercises as indicated.   Samuel Butler tolerated exercise  well today and demonstrated fatigue at approximately 30 minutes.  5x sit to stand is improved to 14.09 seconds although uncontrolled descent on final 2 reps.  Samuel Butler worked on control with eccentric motion on 2nd set. Samuel Butler is able to ascend steps with step over step and use of one rail.  Descending, Samuel Butler requires 2 rails and demonstrates instability and uncontrolled descent.   He will continue to benefit from skilled Samuel Butler for further LE strengthening, endurance training and high-level balance work in order to meet his LTGs and improve function.    Samuel Butler Frequency 2x / week    Samuel Butler Duration 12 weeks    Samuel Butler Treatment/Interventions ADLs/Self Care Home Management;Cryotherapy;Electrical Stimulation;Moist Heat;Traction;Gait Scientist, forensic;Therapeutic activities;Therapeutic exercise;Balance training;Neuromuscular re-education;Patient/family education;Manual techniques;Taping    Samuel Butler Next Visit Plan balance, strength and balance progression as Samuel Butler tolerates. 6 minute walk test next- do at the begining of the session.    Samuel Butler Home Exercise Plan Access Code: [email or web address]    Consulted and Agree with Plan of Care Patient           Patient will benefit from skilled therapeutic intervention in order to improve the following deficits and impairments:  Pain,Postural dysfunction,Decreased strength,Difficulty walking,Decreased balance,Abnormal gait,Decreased endurance  Visit Diagnosis: Muscle weakness (generalized)  Difficulty in walking, not elsewhere classified  Physical deconditioning     Problem List Patient Active Problem List   Diagnosis Date Noted  . Acute pulmonary embolism ([place]) 10/18/2020  . Groin pain, right 08/31/2020  . Chronic viral hepatitis B without delta-agent ([place]) 08/21/2020  . Amyloidosis ([place]) 08/21/2020  . Anasarca 08/04/2020  . Nephrotic syndrome 08/03/2020  . AKI (acute kidney injury) ([employer]) 08/03/2020  . Hypokalemia 06/27/2020  . Claudication in peripheral vascular disease ([place]) 06/26/2020   . Hypertension associated with diabetes ([place]) 05/25/2020  . Peripheral arterial disease ([place]) 03/08/2020  . Vitamin D deficiency 10/26/2019  . Hypoalbuminemia 10/26/2019  . IBD (inflammatory bowel disease) 04/05/2016  . Tobacco use disorder 03/28/2015  . Non-insulin treated type 2 diabetes mellitus ([place]) 01/25/2014  . Essential hypertension, benign 04/16/2013  . Hyperlipemia 04/16/2013  . Gout 04/16/2013  Samuel Butler, Samuel Butler 02/08/21 1:00 PM   [facility] [address] [place], [place], [address] Phone: [phone]   Fax:  [phone]  Name: Samuel Butler MRN: [id number] Date of Birth: Feb 11, 1961

## 2021-02-09 ENCOUNTER — Encounter: Payer: Self-pay | Admitting: Hematology and Oncology

## 2021-02-09 ENCOUNTER — Inpatient Hospital Stay: Payer: BC Managed Care – PPO | Attending: Hematology and Oncology

## 2021-02-09 ENCOUNTER — Inpatient Hospital Stay: Payer: BC Managed Care – PPO

## 2021-02-09 ENCOUNTER — Inpatient Hospital Stay (HOSPITAL_BASED_OUTPATIENT_CLINIC_OR_DEPARTMENT_OTHER): Payer: BC Managed Care – PPO | Admitting: Hematology and Oncology

## 2021-02-09 VITALS — BP 124/94 | HR 93 | Temp 97.6°F | Resp 16 | Ht 71.0 in | Wt 216.4 lb

## 2021-02-09 DIAGNOSIS — N049 Nephrotic syndrome with unspecified morphologic changes: Secondary | ICD-10-CM | POA: Diagnosis not present

## 2021-02-09 DIAGNOSIS — R634 Abnormal weight loss: Secondary | ICD-10-CM | POA: Diagnosis not present

## 2021-02-09 DIAGNOSIS — Z833 Family history of diabetes mellitus: Secondary | ICD-10-CM | POA: Insufficient documentation

## 2021-02-09 DIAGNOSIS — K30 Functional dyspepsia: Secondary | ICD-10-CM | POA: Insufficient documentation

## 2021-02-09 DIAGNOSIS — R600 Localized edema: Secondary | ICD-10-CM | POA: Diagnosis not present

## 2021-02-09 DIAGNOSIS — Z7984 Long term (current) use of oral hypoglycemic drugs: Secondary | ICD-10-CM | POA: Insufficient documentation

## 2021-02-09 DIAGNOSIS — K580 Irritable bowel syndrome with diarrhea: Secondary | ICD-10-CM | POA: Diagnosis not present

## 2021-02-09 DIAGNOSIS — Z86711 Personal history of pulmonary embolism: Secondary | ICD-10-CM | POA: Diagnosis not present

## 2021-02-09 DIAGNOSIS — E8581 Light chain (AL) amyloidosis: Secondary | ICD-10-CM

## 2021-02-09 DIAGNOSIS — R109 Unspecified abdominal pain: Secondary | ICD-10-CM | POA: Diagnosis not present

## 2021-02-09 DIAGNOSIS — E854 Organ-limited amyloidosis: Secondary | ICD-10-CM | POA: Diagnosis not present

## 2021-02-09 DIAGNOSIS — K760 Fatty (change of) liver, not elsewhere classified: Secondary | ICD-10-CM | POA: Insufficient documentation

## 2021-02-09 DIAGNOSIS — Z7901 Long term (current) use of anticoagulants: Secondary | ICD-10-CM | POA: Insufficient documentation

## 2021-02-09 DIAGNOSIS — I10 Essential (primary) hypertension: Secondary | ICD-10-CM | POA: Diagnosis not present

## 2021-02-09 DIAGNOSIS — E119 Type 2 diabetes mellitus without complications: Secondary | ICD-10-CM | POA: Insufficient documentation

## 2021-02-09 DIAGNOSIS — K7460 Unspecified cirrhosis of liver: Secondary | ICD-10-CM | POA: Insufficient documentation

## 2021-02-09 DIAGNOSIS — B181 Chronic viral hepatitis B without delta-agent: Secondary | ICD-10-CM | POA: Insufficient documentation

## 2021-02-09 DIAGNOSIS — Z79899 Other long term (current) drug therapy: Secondary | ICD-10-CM | POA: Insufficient documentation

## 2021-02-09 DIAGNOSIS — Z5111 Encounter for antineoplastic chemotherapy: Secondary | ICD-10-CM | POA: Diagnosis present

## 2021-02-09 DIAGNOSIS — R21 Rash and other nonspecific skin eruption: Secondary | ICD-10-CM | POA: Diagnosis not present

## 2021-02-09 DIAGNOSIS — R6881 Early satiety: Secondary | ICD-10-CM | POA: Diagnosis not present

## 2021-02-09 DIAGNOSIS — Z87891 Personal history of nicotine dependence: Secondary | ICD-10-CM | POA: Insufficient documentation

## 2021-02-09 DIAGNOSIS — K921 Melena: Secondary | ICD-10-CM | POA: Insufficient documentation

## 2021-02-09 DIAGNOSIS — Z8249 Family history of ischemic heart disease and other diseases of the circulatory system: Secondary | ICD-10-CM | POA: Insufficient documentation

## 2021-02-09 DIAGNOSIS — E785 Hyperlipidemia, unspecified: Secondary | ICD-10-CM | POA: Diagnosis not present

## 2021-02-09 DIAGNOSIS — R200 Anesthesia of skin: Secondary | ICD-10-CM | POA: Diagnosis not present

## 2021-02-09 DIAGNOSIS — Z823 Family history of stroke: Secondary | ICD-10-CM | POA: Insufficient documentation

## 2021-02-09 DIAGNOSIS — Z5112 Encounter for antineoplastic immunotherapy: Secondary | ICD-10-CM | POA: Insufficient documentation

## 2021-02-09 DIAGNOSIS — Z800 Family history of malignant neoplasm of digestive organs: Secondary | ICD-10-CM | POA: Insufficient documentation

## 2021-02-09 LAB — CBC WITH DIFFERENTIAL (CANCER CENTER ONLY)
Abs Immature Granulocytes: 0.01 10*3/uL (ref 0.00–0.07)
Basophils Absolute: 0 10*3/uL (ref 0.0–0.1)
Basophils Relative: 0 %
Eosinophils Absolute: 0.1 10*3/uL (ref 0.0–0.5)
Eosinophils Relative: 3 %
HCT: 33.4 % — ABNORMAL LOW (ref 39.0–52.0)
Hemoglobin: 11.2 g/dL — ABNORMAL LOW (ref 13.0–17.0)
Immature Granulocytes: 0 %
Lymphocytes Relative: 21 %
Lymphs Abs: 0.8 10*3/uL (ref 0.7–4.0)
MCH: 33.2 pg (ref 26.0–34.0)
MCHC: 33.5 g/dL (ref 30.0–36.0)
MCV: 99.1 fL (ref 80.0–100.0)
Monocytes Absolute: 0.4 10*3/uL (ref 0.1–1.0)
Monocytes Relative: 9 %
Neutro Abs: 2.6 10*3/uL (ref 1.7–7.7)
Neutrophils Relative %: 67 %
Platelet Count: 125 10*3/uL — ABNORMAL LOW (ref 150–400)
RBC: 3.37 MIL/uL — ABNORMAL LOW (ref 4.22–5.81)
RDW: 15.4 % (ref 11.5–15.5)
WBC Count: 4 10*3/uL (ref 4.0–10.5)
nRBC: 0 % (ref 0.0–0.2)

## 2021-02-09 LAB — CMP (CANCER CENTER ONLY)
ALT: 43 U/L (ref 0–44)
AST: 27 U/L (ref 15–41)
Albumin: 1.6 g/dL — ABNORMAL LOW (ref 3.5–5.0)
Alkaline Phosphatase: 76 U/L (ref 38–126)
Anion gap: 11 (ref 5–15)
BUN: 19 mg/dL (ref 8–23)
CO2: 27 mmol/L (ref 22–32)
Calcium: 7.4 mg/dL — ABNORMAL LOW (ref 8.9–10.3)
Chloride: 108 mmol/L (ref 98–111)
Creatinine: 1.09 mg/dL (ref 0.61–1.24)
GFR, Estimated: >60 mL/min (ref >60)
Glucose, Bld: 85 mg/dL (ref 70–99)
Potassium: 3.5 mmol/L (ref 3.5–5.1)
Sodium: 146 mmol/L — ABNORMAL HIGH (ref 135–145)
Total Bilirubin: 0.4 mg/dL (ref 0.3–1.2)
Total Protein: 4.2 g/dL — ABNORMAL LOW (ref 6.5–8.1)

## 2021-02-09 LAB — LACTATE DEHYDROGENASE: LDH: 270 U/L — ABNORMAL HIGH (ref 98–192)

## 2021-02-09 MED ORDER — DIPHENHYDRAMINE HCL 25 MG PO CAPS
50.0000 mg | ORAL_CAPSULE | Freq: Once | ORAL | Status: AC
  Administered 2021-02-09: 50 mg via ORAL

## 2021-02-09 MED ORDER — BORTEZOMIB CHEMO SQ INJECTION 3.5 MG (2.5MG/ML)
1.3000 mg/m2 | Freq: Once | INTRAMUSCULAR | Status: AC
Start: 2021-02-09 — End: 2021-02-09
  Administered 2021-02-09: 3 mg via SUBCUTANEOUS
  Filled 2021-02-09: qty 1.2

## 2021-02-09 MED ORDER — SODIUM CHLORIDE 0.9 % IV SOLN
INTRAVENOUS | Status: DC
  Filled 2021-02-09: qty 250

## 2021-02-09 MED ORDER — DIPHENHYDRAMINE HCL 25 MG PO CAPS
ORAL_CAPSULE | ORAL | Status: AC
  Filled 2021-02-09: qty 2

## 2021-02-09 MED ORDER — SODIUM CHLORIDE 0.9 % IV SOLN
300.0000 mg/m2 | Freq: Once | INTRAVENOUS | Status: AC
  Administered 2021-02-09: 680 mg via INTRAVENOUS
  Filled 2021-02-09: qty 34

## 2021-02-09 MED ORDER — DARATUMUMAB-HYALURONIDASE-FIHJ 1800-30000 MG-UT/15ML ~~LOC~~ SOLN
1800.0000 mg | Freq: Once | SUBCUTANEOUS | Status: AC
  Administered 2021-02-09: 1800 mg via SUBCUTANEOUS
  Filled 2021-02-09: qty 15

## 2021-02-09 MED ORDER — DEXAMETHASONE 4 MG PO TABS
ORAL_TABLET | ORAL | Status: AC
  Filled 2021-02-09: qty 10

## 2021-02-09 MED ORDER — DEXAMETHASONE 4 MG PO TABS
40.0000 mg | ORAL_TABLET | Freq: Once | ORAL | Status: AC
  Administered 2021-02-09: 40 mg via ORAL

## 2021-02-09 MED ORDER — DEXAMETHASONE 4 MG PO TABS
ORAL_TABLET | ORAL | Status: AC
  Filled 2021-02-09: qty 1

## 2021-02-09 MED ORDER — ACETAMINOPHEN 325 MG PO TABS
650.0000 mg | ORAL_TABLET | Freq: Once | ORAL | Status: AC
  Administered 2021-02-09: 650 mg via ORAL

## 2021-02-09 MED ORDER — ACETAMINOPHEN 325 MG PO TABS
ORAL_TABLET | ORAL | Status: AC
  Filled 2021-02-09: qty 2

## 2021-02-09 NOTE — Patient Instructions (Signed)
[facility] Discharge Instructions for Patients Receiving Chemotherapy  Today you received the following chemotherapy agents Cyclophosphamide (CYTOXAN), Velcade , Darzalex Faspro.   To help prevent nausea and vomiting after your treatment, we encourage you to take your nausea medication as prescribed.   If you develop nausea and vomiting that is not controlled by your nausea medication, call the clinic.   BELOW ARE SYMPTOMS THAT SHOULD BE REPORTED IMMEDIATELY:  *FEVER GREATER THAN 100.5 F  *CHILLS WITH OR WITHOUT FEVER  NAUSEA AND VOMITING THAT IS NOT CONTROLLED WITH YOUR NAUSEA MEDICATION  *UNUSUAL SHORTNESS OF BREATH  *UNUSUAL BRUISING OR BLEEDING  TENDERNESS IN MOUTH AND THROAT WITH OR WITHOUT PRESENCE OF ULCERS  *URINARY PROBLEMS  *BOWEL PROBLEMS  UNUSUAL RASH Items with * indicate a potential emergency and should be followed up as soon as possible.  Feel free to call the clinic should you have any questions or concerns. The clinic phone number is [phone].  Please show the [facility] at check-in to the Emergency Department and triage nurse.

## 2021-02-09 NOTE — Progress Notes (Signed)
[place] [phone]   [phone]  PROGRESS NOTE  Patient Care Team: Isaac Bliss, Rayford Halsted, MD as PCP - General (Internal Medicine) Lorretta Harp, MD as PCP - Cardiology (Cardiology)  Hematological/Oncological History # AL Amyloidosis of the Kidney 1) 08/03/2020: Free kappa 19.5, Lambda 205.7, Kappa/Lambda ratio: 0.09. M protein undetectable in serum.  2) 08/07/2020: kidney biopsy performed, results consistent with lambda light chain AL amyloidosis 3) 08/23/2020: establish care with Dr. Lorenso Courier  4) 09/01/2020: bone marrow biopsy shows plasma cell neoplasm with focal amyloid deposits 5) 09/29/2020: start of Cycle 1 Day 1 of Dara-CyBorD 6) 11/02/2020: Cycle 2 Day 1 of Dara-CyBorD 7) 12/01/2020: Cycle 3 Day 1 of Dara-CyBorD 8) 12/29/2020: Cycle 4 Day 1 of Dara-CyBorD 9) 01/05/2021: Holding Velcade due to diarrhea.  10) 01/19/2021: Adding back Velcade due to improvement of diarrhea with imodium 11) 01/26/2021:Cycle 5 Day 1 of Dara-CyBorD  Interval History:  Samuel Butler 60 y.o. male with medical history significant for AL amyloidosis of the kidney presents for a follow up visit. The patient's last visit was on 02/01/2021 with Dr. Chryl Heck. In the interim since his last visit he has continued his treatment of Dara-CyBorD.   On exam today Mr. Rodino notes that he is doing well.  He notes that he does continue to have some stomach upset and is currently scheduled for an endoscopy on 02/26/2021.  He notes that his stomach makes a lot of noise and he does have some occasional loose stools.  He is otherwise tolerating chemotherapy very well with no nausea, vomiting, or neuropathy.  He is tolerating blood thinner well with no bleeding, bruising, or dark stools, though he does endorse some occasional red blood on the toilet paper.Marland Kitchen  He denies any fevers, chills, night sweats, worsening shortness of breath, chest pain or cough.  He has no other complaints.  A full 10  point ROS is listed below.  MEDICAL HISTORY:  Past Medical History:  Diagnosis Date  . DDD (degenerative disc disease), lumbar    per his report  . Diabetes mellitus without complication ([place])   . Gout 04/16/2013  . Hyperlipidemia   . Hypertension   . IBD (inflammatory bowel disease) 04/05/2016  . Pulmonary embolism ([place])   . Tobacco use disorder 03/28/2015    SURGICAL HISTORY: Past Surgical History:  Procedure Laterality Date  . ABDOMINAL AORTOGRAM W/LOWER EXTREMITY Right 06/26/2020   Procedure: ABDOMINAL AORTOGRAM W/LOWER EXTREMITY;  Surgeon: Lorretta Harp, MD;  Location: [facility];  Service: Cardiovascular;  Laterality: Right;  . COLONOSCOPY    . PERIPHERAL VASCULAR INTERVENTION  06/26/2020   Procedure: PERIPHERAL VASCULAR INTERVENTION;  Surgeon: Lorretta Harp, MD;  Location: [facility];  Service: Cardiovascular;;  Right SFA  . TONSILLECTOMY      SOCIAL HISTORY: Social History   Socioeconomic History  . Marital status: Married    Spouse name: Not on file  . Number of children: Not on file  . Years of education: Not on file  . Highest education level: Not on file  Occupational History  . Not on file  Tobacco Use  . Smoking status: Former Smoker    Types: Cigarettes    Quit date: 10/23/2016    Years since quitting: 4.3  . Smokeless tobacco: Never Used  . Tobacco comment: per patient 5 cigarettes a day   Vaping Use  . Vaping Use: Never used  Substance and Sexual Activity  . Alcohol use: Not Currently  Comment: occasional  . Drug use: No  . Sexual activity: Not on file  Other Topics Concern  . 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 Difficulty of Paying Living Expenses:  Somewhat hard  Food Insecurity: No Food Insecurity  . Worried About Charity fundraiser in the Last Year: Never true  . Ran Out of Food in the Last Year: Never true  Transportation Needs: No Transportation Needs  . Lack of Transportation (Medical): No  . Lack of Transportation (Non-Medical): No  Physical Activity: Not on file  Stress: Not on file  Social Connections: Not on file  Intimate Partner Violence: Not on file    FAMILY HISTORY: Family History  Problem Relation Age of Onset  . Hypertension Father   . Diabetes Father   . Stroke Father   . Pancreatic cancer Father   . Colon cancer Brother   . Esophageal cancer Neg Hx   . Stomach cancer Neg Hx     ALLERGIES:  is allergic to crestor [rosuvastatin calcium].  MEDICATIONS:  Current Outpatient Medications  Medication Sig Dispense Refill  . acetaminophen (TYLENOL) 650 MG CR tablet Take 1,300 mg by mouth every 8 (eight) hours as needed for pain.     Marland Kitchen acyclovir (ZOVIRAX) 400 MG tablet Take 1 tablet (400 mg total) by mouth 2 (two) times daily. 60 tablet 3  . albuterol (VENTOLIN HFA) 108 (90 Base) MCG/ACT inhaler Inhale 2 puffs into the lungs every 6 (six) hours as needed for wheezing or shortness of breath. 8 g 0  . apixaban (ELIQUIS) 5 MG TABS tablet Take 1 tablet (5 mg total) by mouth 2 (two) times daily. 180 tablet 1  . atorvastatin (LIPITOR) 80 MG tablet Take 1 tablet (80 mg total) by mouth daily. 90 tablet 2  . clopidogrel (PLAVIX) 75 MG tablet Take 1 tablet (75 mg total) by mouth daily with breakfast. 90 tablet 3  . dicyclomine (BENTYL) 20 MG tablet Take 1 tablet (20 mg total) by mouth 4 (four) times daily -  before meals and at bedtime. Take 20-30 minutes before meals. 120 tablet 2  . Evolocumab (REPATHA SURECLICK) 469 MG/ML SOAJ Inject 140 mg into the skin every 14 (fourteen) days. 2 mL 12  . ezetimibe (ZETIA) 10 MG tablet Take 1 tablet (10 mg total) by mouth daily. 90 tablet 3  . isosorbide mononitrate (IMDUR) 30 MG 24 hr  tablet Take 1 tablet by mouth once daily 90 tablet 1  . losartan (COZAAR) 50 MG tablet Take 1 tablet (50 mg total) by mouth daily. 90 tablet 3  . metFORMIN (GLUCOPHAGE) 500 MG tablet Take 1 tablet by mouth once daily with breakfast 90 tablet 1  . ondansetron (ZOFRAN) 8 MG tablet TAKE 1 TABLET BY MOUTH EVERY 8 HOURS AS NEEDED FOR NAUSEA OR VOMITING 30 tablet 0  . pantoprazole (PROTONIX) 40 MG tablet Take 1 tablet (40 mg total) by mouth daily. 90 tablet 1  . potassium chloride SA (KLOR-CON) 20 MEQ tablet Take 2 tablets (40 mEq total) by mouth daily. 80MG x3 days then back to 40 mg/day 33 tablet 6  . torsemide (DEMADEX) 20  MG tablet Take 1 tablet (20 mg total) by mouth 2 (two) times daily. 34m BID x 3 days then back to 21mBID 33 tablet 6  . VEMLIDY 25 MG TABS Take 1 tablet (25 mg total) by mouth daily. 30 tablet 5  . Vitamin D, Ergocalciferol, (DRISDOL) 1.25 MG (50000 UNIT) CAPS capsule Take 1 capsule by mouth once a week 12 capsule 0   No current facility-administered medications for this visit.   Facility-Administered Medications Ordered in Other Visits  Medication Dose Route Frequency Provider Last Rate Last Admin  . acetaminophen (TYLENOL) tablet 650 mg  650 mg Oral Once DoLedell PeoplesV, MD      . bortezomib SQ (VELCADE) chemo injection (2.57m33mL concentration) 3 mg  1.3 mg/m2 (Treatment Plan Recorded) Subcutaneous Once DorOrson SlickD      . cyclophosphamide (CYTOXAN) 680 mg in sodium chloride 0.9 % 250 mL chemo infusion  300 mg/m2 (Treatment Plan Recorded) Intravenous Once DorOrson SlickD      . daratumumab-hyaluronidase-fihj ([facility]) 1800-30000 MG-UT/15ML chemo SQ injection 1,800 mg  1,800 mg Subcutaneous Once DorLedell Peoples, MD      . dexamethasone (DECADRON) tablet 40 mg  40 mg Oral Once DorOrson SlickD      . diphenhydrAMINE (BENADRYL) capsule 50 mg  50 mg Oral Once DorOrson SlickD        REVIEW OF SYSTEMS:   Constitutional: ( - ) fevers, ( - )   chills , ( - ) night sweats Eyes: ( - ) blurriness of vision, ( - ) double vision, ( - ) watery eyes Ears, nose, mouth, throat, and face: ( - ) mucositis, ( - ) sore throat Respiratory: ( - ) cough, ( - ) dyspnea, ( - ) wheezes Cardiovascular: ( - ) palpitation, ( - ) chest discomfort, ( - ) lower extremity swelling Gastrointestinal:  ( - ) nausea, ( - ) heartburn, ( - ) change in bowel habits Skin: ( - ) abnormal skin rashes Lymphatics: ( - ) new lymphadenopathy, ( - ) easy bruising Neurological: (+) numbness, ( - ) tingling, ( - ) new weaknesses Behavioral/Psych: ( - ) mood change, ( - ) new changes  All other systems were reviewed with the patient and are negative.  PHYSICAL EXAMINATION: ECOG PERFORMANCE STATUS: 1 - Symptomatic but completely ambulatory  Vitals:   02/09/21 0804  BP: (!) 124/94  Pulse: 93  Resp: 16  Temp: 97.6 F (36.4 C)  SpO2: 98%   Filed Weights   02/09/21 0804  Weight: 216 lb 6.4 oz (98.2 kg)    GENERAL: well appearing middle aged [place]fr[place] male alert, no distress and comfortable SKIN: skin color, texture, turgor are normal, no rashes or significant lesions EYES: conjunctiva are pink and non-injected, sclera clear LUNGS: clear to auscultation and percussion with normal breathing effort HEART: regular rate & rhythm and no murmurs and +2 lower extremity edema bilaterally (L > R) Musculoskeletal: no cyanosis of digits and no clubbing  PSYCH: alert & oriented x 3, fluent speech NEURO: no focal motor/sensory deficits  LABORATORY DATA:  I have reviewed the data as listed CBC Latest Ref Rng & Units 02/09/2021 02/01/2021 01/26/2021  WBC 4.0 - 10.5 K/uL 4.0 4.5 3.6(L)  Hemoglobin 13.0 - 17.0 g/dL 11.2(L) 11.4(L) 11.0(L)  Hematocrit 39.0 - 52.0 % 33.4(L) 33.7(L) 33.1(L)  Platelets 150 - 400 K/uL 125(L) 104(L) 125(L)    CMP Latest Ref  Rng & Units 02/09/2021 02/01/2021 01/26/2021  Glucose 70 - 99 mg/dL 85 89 85  BUN 8 - 23 mg/dL 19 24(H) 18  Creatinine 0.61  - 1.24 mg/dL 1.09 1.01 0.98  Sodium 135 - 145 mmol/L 146(H) 145 143  Potassium 3.5 - 5.1 mmol/L 3.5 3.5 3.4(L)  Chloride 98 - 111 mmol/L 108 108 108  CO2 22 - 32 mmol/L 27 28 28   Calcium 8.9 - 10.3 mg/dL 7.4(L) 7.4(L) 7.6(L)  Total Protein 6.5 - 8.1 g/dL 4.2(L) 4.0(L) 4.0(L)  Total Bilirubin 0.3 - 1.2 mg/dL 0.4 0.4 0.5  Alkaline Phos 38 - 126 U/L 76 71 71  AST 15 - 41 U/L 27 21 34  ALT 0 - 44 U/L 43 33 44    Lab Results  Component Value Date   MPROTEIN Not Observed 01/12/2021   MPROTEIN 0.1 (H) 12/15/2020   MPROTEIN 0.1 (H) 11/17/2020   Lab Results  Component Value Date   KPAFRELGTCHN 11.7 01/12/2021   KPAFRELGTCHN 15.9 12/15/2020   KPAFRELGTCHN 12.1 11/17/2020   LAMBDASER 16.9 01/12/2021   LAMBDASER 21.7 12/15/2020   LAMBDASER 27.4 (H) 11/17/2020   KAPLAMBRATIO 0.69 01/12/2021   KAPLAMBRATIO 0.83 (L) 01/09/2021   KAPLAMBRATIO 0.73 12/15/2020     RADIOGRAPHIC STUDIES: No results found.  ASSESSMENT & PLAN Declin A Arrasmith 60 y.o. male with medical history significant for AL amyloidosis of the kidney presents for a follow up visit.  After review the labs, the records, discussion with the patient the findings are most consistent with AL amyloidosis affecting the kidney.  As such I would recommended we start treatment with Dara CyBorD.  After reviewing the biopsy results his findings are most consistent with AL amyloidosis. As such the treatment of choice would be to target his plasma cell population with a triplet or quadruplet therapy. Therapy of choice in this case would consist of daratumumab, Velcade, cyclophosphamide, and dexamethasone. Given the patient's good functional status we will start with full dose Dara-CyBorD. I previously discussed the side effects of this chemotherapy with the patient including neuropathy, elevated blood pressure, drop in blood counts, hypersensitivity reaction, chest tightness, increased infection risk, and fatigue. The patient and family  voiced their understanding of these findings and are agreeable to moving forward with quadruple therapy with the goal of being a bridge to bone marrow transplant.   The regimen of choice is daratumumab, bortezomib, cyclophosphamide and dexamethasone per the ANDROMEDA Study ( Blood. 2020 Jul 2;136(1):71-80). Treatment consists of: Cyclophosphamide 300 mg/m2 intravenously and bortezomib 1.3 mg/m2 subcutaneously were given on days 1, 8, 15, and 22 of each 28 day cycle for up to 6 cycles. Dexamethasone 40 mg (starting dose) was given orally or intravenously weekly for each cycle for up to 6 cycles. DARA [place] was administered in a single, premixed vial and given by manual [place] injection over the course of 3 to 5 minutes weekly in cycles 1 to 2, every 2 weeks in cycles 3 to 6, and every 4 weeks thereafter as monotherapy for a maximum of 2 years. We would consider this continued dosing regimen if patient declined or was determined not to be a candidate for BMT.   On exam today,  Mr. Prom is currently at his baseline. He continues to have GI upset, but not as severe on imodium/lomotil.  Labs from today were reviewed without any intervention needed.  Patient will proceed with treatment as planned.  Patient will return to the clinic in 2 weeks as scheduled.  #AL Amyloidosis Affecting  the Kidney --bone marrow biopsy and kidney biopsy helped to confirm the diagnosis of AL amyloidosis. --currently being treated with  Dara-CyBorD (per the [facility] Trial), started Cycle 1 Day 1 on 09/29/2020.  --today is Cycle 5 Day 15 of treatment.  --added back Velcade due to improvement of diarrhea --will monitoring monthly SPEP, UPEP, and SFLC while on treatment. --will have patient return for weekly treatment with q 2 week clinic visits with interval weekly treatment  #Diarrhea with abdominal cramping --Improved with Imodium and Bentyl, but still has some persistent symptoms.  --GI scheduled EGD/Colonoscopy on 02/26/2021    #Weight Loss, improving --Diarrhea combined with diuresis is likely causing his weight loss --weight increased to 216 on 02/09/2021, up 5 lbs from 01/19/21. --continue to monitor  #Injection Site Rash, improved --appears benign on exam today  --completed antibiotic therapy  #Supportive Care --chemotherapy education performed --zofran 92m q8H PRN and compazine 166mPO q6H for nausea --acyclovir 40072mO BID for VCZ prophylaxis --albuterol HFA inhaler for daratumumab treatment -- EMLA cream for port not required, patient declines port at this time, but will reconsider if patient desires.  -- no pain medication required at this time.   No orders of the defined types were placed in this encounter.   All questions were answered. The patient knows to call the clinic with any problems, questions or concerns.  A total of more than 30 minutes were spent on this encounter and over half of that time was spent on counseling and coordination of care as outlined above.   JohLedell PeoplesD Department of Hematology/Oncology [facility]: [phone]: [phone]: [email or web address]  02/09/2021 9:21 AM   Literature Support:  V, Clarita Craneasey SY, Weiss BM, VerRush Landmarkerlini G, Comenzo RL. Daratumumab plus CyBorD for patients with newly diagnosed AL amyloidosis: safety run-in results of ANDROMEDA. Blood. 2020 Jul 2;136(1):71-80.  --Daratumumab-CyBorD was well tolerated, with no new safety concerns versus the intravenous formulation, and demonstrated robust hematologic and organ responses.

## 2021-02-10 LAB — HEPATITIS DELTA ANTIBODY: Hepatitis D Ab, Total: NEGATIVE

## 2021-02-10 LAB — HEPATITIS B CORE ANTIBODY, TOTAL: Hep B Core Total Ab: REACTIVE — AB

## 2021-02-10 LAB — HEPATITIS B DNA, ULTRAQUANTITATIVE, PCR
Hepatitis B DNA (Calc): 3.66 Log IU/mL — ABNORMAL HIGH
Hepatitis B DNA: 4550 IU/mL — ABNORMAL HIGH

## 2021-02-10 LAB — HEPATITIS B SURFACE ANTIBODY,QUALITATIVE: Hep B S Ab: NONREACTIVE

## 2021-02-10 LAB — HEPATITIS B E ANTIBODY: Hep B E Ab: REACTIVE — AB

## 2021-02-10 LAB — HEPATITIS B SURFACE ANTIGEN: Hepatitis B Surface Ag: REACTIVE — AB

## 2021-02-12 LAB — MULTIPLE MYELOMA PANEL, SERUM
Albumin SerPl Elph-Mcnc: 1.8 g/dL — ABNORMAL LOW (ref 2.9–4.4)
Albumin/Glob SerPl: 1 (ref 0.7–1.7)
Alpha 1: 0.2 g/dL (ref 0.0–0.4)
Alpha2 Glob SerPl Elph-Mcnc: 1 g/dL (ref 0.4–1.0)
B-Globulin SerPl Elph-Mcnc: 0.6 g/dL — ABNORMAL LOW (ref 0.7–1.3)
Gamma Glob SerPl Elph-Mcnc: 0.2 g/dL — ABNORMAL LOW (ref 0.4–1.8)
Globulin, Total: 2 g/dL — ABNORMAL LOW (ref 2.2–3.9)
IgA: 29 mg/dL — ABNORMAL LOW (ref 61–437)
IgG (Immunoglobin G), Serum: 122 mg/dL — ABNORMAL LOW (ref 603–1613)
IgM (Immunoglobulin M), Srm: 20 mg/dL (ref 20–172)
M Protein SerPl Elph-Mcnc: 0.1 g/dL — ABNORMAL HIGH
Total Protein ELP: 3.8 g/dL — ABNORMAL LOW (ref 6.0–8.5)

## 2021-02-12 LAB — KAPPA/LAMBDA LIGHT CHAINS
Kappa free light chain: 10.2 mg/L (ref 3.3–19.4)
Kappa, lambda light chain ratio: 0.74 (ref 0.26–1.65)
Lambda free light chains: 13.7 mg/L (ref 5.7–26.3)

## 2021-02-16 ENCOUNTER — Inpatient Hospital Stay: Payer: BC Managed Care – PPO

## 2021-02-16 ENCOUNTER — Other Ambulatory Visit: Payer: Self-pay

## 2021-02-16 ENCOUNTER — Inpatient Hospital Stay: Payer: BC Managed Care – PPO | Admitting: Dietician

## 2021-02-16 VITALS — BP 128/97 | HR 87 | Temp 98.4°F | Resp 18

## 2021-02-16 DIAGNOSIS — E8581 Light chain (AL) amyloidosis: Secondary | ICD-10-CM

## 2021-02-16 DIAGNOSIS — Z5112 Encounter for antineoplastic immunotherapy: Secondary | ICD-10-CM | POA: Diagnosis not present

## 2021-02-16 LAB — CBC WITH DIFFERENTIAL (CANCER CENTER ONLY)
Abs Immature Granulocytes: 0.01 10*3/uL (ref 0.00–0.07)
Basophils Absolute: 0 10*3/uL (ref 0.0–0.1)
Basophils Relative: 0 %
Eosinophils Absolute: 0.1 10*3/uL (ref 0.0–0.5)
Eosinophils Relative: 3 %
HCT: 33.5 % — ABNORMAL LOW (ref 39.0–52.0)
Hemoglobin: 11.2 g/dL — ABNORMAL LOW (ref 13.0–17.0)
Immature Granulocytes: 0 %
Lymphocytes Relative: 18 %
Lymphs Abs: 0.7 10*3/uL (ref 0.7–4.0)
MCH: 33 pg (ref 26.0–34.0)
MCHC: 33.4 g/dL (ref 30.0–36.0)
MCV: 98.8 fL (ref 80.0–100.0)
Monocytes Absolute: 0.3 10*3/uL (ref 0.1–1.0)
Monocytes Relative: 10 %
Neutro Abs: 2.5 10*3/uL (ref 1.7–7.7)
Neutrophils Relative %: 69 %
Platelet Count: 136 10*3/uL — ABNORMAL LOW (ref 150–400)
RBC: 3.39 MIL/uL — ABNORMAL LOW (ref 4.22–5.81)
RDW: 15.3 % (ref 11.5–15.5)
WBC Count: 3.6 10*3/uL — ABNORMAL LOW (ref 4.0–10.5)
nRBC: 0 % (ref 0.0–0.2)

## 2021-02-16 LAB — CMP (CANCER CENTER ONLY)
ALT: 43 U/L (ref 0–44)
AST: 32 U/L (ref 15–41)
Albumin: 1.6 g/dL — ABNORMAL LOW (ref 3.5–5.0)
Alkaline Phosphatase: 75 U/L (ref 38–126)
Anion gap: 12 (ref 5–15)
BUN: 20 mg/dL (ref 8–23)
CO2: 27 mmol/L (ref 22–32)
Calcium: 7.5 mg/dL — ABNORMAL LOW (ref 8.9–10.3)
Chloride: 108 mmol/L (ref 98–111)
Creatinine: 1.04 mg/dL (ref 0.61–1.24)
GFR, Estimated: >60 mL/min (ref >60)
Glucose, Bld: 96 mg/dL (ref 70–99)
Potassium: 3.5 mmol/L (ref 3.5–5.1)
Sodium: 147 mmol/L — ABNORMAL HIGH (ref 135–145)
Total Bilirubin: 0.4 mg/dL (ref 0.3–1.2)
Total Protein: 4.1 g/dL — ABNORMAL LOW (ref 6.5–8.1)

## 2021-02-16 LAB — LACTATE DEHYDROGENASE: LDH: 274 U/L — ABNORMAL HIGH (ref 98–192)

## 2021-02-16 MED ORDER — DEXAMETHASONE 4 MG PO TABS
40.0000 mg | ORAL_TABLET | Freq: Once | ORAL | Status: AC
  Administered 2021-02-16: 40 mg via ORAL

## 2021-02-16 MED ORDER — SODIUM CHLORIDE 0.9 % IV SOLN
300.0000 mg/m2 | Freq: Once | INTRAVENOUS | Status: AC
  Administered 2021-02-16: 680 mg via INTRAVENOUS
  Filled 2021-02-16: qty 34

## 2021-02-16 MED ORDER — DEXAMETHASONE 4 MG PO TABS
ORAL_TABLET | ORAL | Status: AC
  Filled 2021-02-16: qty 10

## 2021-02-16 MED ORDER — BORTEZOMIB CHEMO SQ INJECTION 3.5 MG (2.5MG/ML)
1.3000 mg/m2 | Freq: Once | INTRAMUSCULAR | Status: AC
  Administered 2021-02-16: 3 mg via SUBCUTANEOUS
  Filled 2021-02-16: qty 1.2

## 2021-02-16 NOTE — Patient Instructions (Signed)
[facility] Discharge Instructions for Patients Receiving Chemotherapy  Today you received the following chemotherapy agents: Velcade, Cytoxan   To help prevent nausea and vomiting after your treatment, we encourage you to take your nausea medication as directed.    If you develop nausea and vomiting that is not controlled by your nausea medication, call the clinic.   BELOW ARE SYMPTOMS THAT SHOULD BE REPORTED IMMEDIATELY:  *FEVER GREATER THAN 100.5 F  *CHILLS WITH OR WITHOUT FEVER  NAUSEA AND VOMITING THAT IS NOT CONTROLLED WITH YOUR NAUSEA MEDICATION  *UNUSUAL SHORTNESS OF BREATH  *UNUSUAL BRUISING OR BLEEDING  TENDERNESS IN MOUTH AND THROAT WITH OR WITHOUT PRESENCE OF ULCERS  *URINARY PROBLEMS  *BOWEL PROBLEMS  UNUSUAL RASH Items with * indicate a potential emergency and should be followed up as soon as possible.  Feel free to call the clinic should you have any questions or concerns. The clinic phone number is [phone].  Please show the [place] at check-in to the Emergency Department and triage nurse.

## 2021-02-16 NOTE — Progress Notes (Signed)
Nutrition Follow-up:  Patient with AL amyloidosis of kidney. He is receiving Dara-CyBorD  Met with patient during infusion. He reports ongoing diarrhea and abdominal cramping. Reports going for EGD in a couple of weeks. He is having some relief with imodium, but does not like to take it all the time. Patient reports symptoms occur after meals. He reports his stools are black in color, oily and have very foul odor. Patient received handouts mailed to him and has been trying to eat smaller meals more frequently and limiting high fiber foods. He has been drinking 2 Boost High Protein and water. Yesterday patient ate bowl of cheerios and a philly cheese steak for lunch.    Medications: Bentyl, Imdur, Metformin, Zofran, Protonix, Klor-con, Demadex, Vit D  Labs: Na 147, Albumin 1.6  Anthropometrics: Weight 216 lb 6.4 oz on 4/1 increased from 214 lb 3.2 oz on 3/24  3/18 - 213 lb 8 oz 3/10 - 210 lb 2/25 - 217 lb 9.6 oz   NUTRITION DIAGNOSIS: Unintentional weight loss improving   INTERVENTION:  Recommend PERT trial - ?EPI, in-basket sent to MD Reviewed strategies for managing diarrhea (eating small meals more frequently, limiting high fiber foods, avoiding greasy/fatty foods)  Sample of Ensure Clear provided Coupons provided     MONITORING, EVALUATION, GOAL: weight trends, intake, diarrhea   NEXT VISIT: Friday April 22 in infusion

## 2021-02-20 ENCOUNTER — Ambulatory Visit (INDEPENDENT_AMBULATORY_CARE_PROVIDER_SITE_OTHER): Payer: BC Managed Care – PPO | Admitting: Internal Medicine

## 2021-02-20 ENCOUNTER — Encounter: Payer: Self-pay | Admitting: Internal Medicine

## 2021-02-20 ENCOUNTER — Other Ambulatory Visit: Payer: Self-pay

## 2021-02-20 VITALS — BP 129/89 | HR 84 | Temp 97.5°F | Ht 71.0 in | Wt 212.0 lb

## 2021-02-20 DIAGNOSIS — E8581 Light chain (AL) amyloidosis: Secondary | ICD-10-CM

## 2021-02-20 DIAGNOSIS — B181 Chronic viral hepatitis B without delta-agent: Secondary | ICD-10-CM

## 2021-02-20 NOTE — Progress Notes (Signed)
[facility]  CHIEF COMPLAINT:    Follow up for hepatitis B follow up  SUBJECTIVE:    Samuel Butler is a 60 y.o. male with PMHx as below who presents to the clinic for hepatitis B follow up.  Last seen by me as virtual visit on 11/27/20.  He was continued on TAF for treatment of chronic hepatitis B in setting of chemotherapy.  Labs in January were not obtained so most recent HBV labs were from October 2021 with DNA PCR 122000 copies, Fibrosis score F0-F1, normal LFTs.  US/Elastography with cirrhotic appearance of liver but kPA was less than 5 and no hepatic mass was seen.  He continues to follow with Dr Lorenso Courier and last saw him 02/09/21 and continues on treatment.  He has also been experiencing some GI symptoms and is scheduled for EGD/colonoscopy with GI on 03/28/21.  He was able to complete HBV labs for [place] on 02/06/21 which showed: HBV delta Ab negative Surface Ab negative, core Ab reactive, surface Ag reactive HBV DNA PCR 4,550 copies E Ab reactive  CMP 02/16/21 with normal LFTs.  Please see A&P for the details of today's visit and status of the patient's medical problems.   Patient's Medications  New Prescriptions   No medications on file  Previous Medications   ACETAMINOPHEN (TYLENOL) 650 MG CR TABLET    Take 1,300 mg by mouth every 8 (eight) hours as needed for pain.    ACYCLOVIR (ZOVIRAX) 400 MG TABLET    Take 1 tablet (400 mg total) by mouth 2 (two) times daily.   ALBUTEROL (VENTOLIN HFA) 108 (90 BASE) MCG/ACT INHALER    Inhale 2 puffs into the lungs every 6 (six) hours as needed for wheezing or shortness of breath.   APIXABAN (ELIQUIS) 5 MG TABS TABLET    Take 1 tablet (5 mg total) by mouth 2 (two) times daily.   ATORVASTATIN (LIPITOR) 80 MG TABLET    Take 1 tablet (80 mg total) by mouth daily.   CLOPIDOGREL (PLAVIX) 75 MG TABLET    Take 1 tablet (75 mg total) by mouth daily with breakfast.   DICYCLOMINE (BENTYL) 20 MG TABLET    Take 1 tablet  (20 mg total) by mouth 4 (four) times daily -  before meals and at bedtime. Take 20-30 minutes before meals.   EVOLOCUMAB (REPATHA SURECLICK) 440 MG/ML SOAJ    Inject 140 mg into the skin every 14 (fourteen) days.   EZETIMIBE (ZETIA) 10 MG TABLET    Take 1 tablet (10 mg total) by mouth daily.   ISOSORBIDE MONONITRATE (IMDUR) 30 MG 24 HR TABLET    Take 1 tablet by mouth once daily   LOSARTAN (COZAAR) 50 MG TABLET    Take 1 tablet (50 mg total) by mouth daily.   METFORMIN (GLUCOPHAGE) 500 MG TABLET    Take 1 tablet by mouth once daily with breakfast   ONDANSETRON (ZOFRAN) 8 MG TABLET    TAKE 1 TABLET BY MOUTH EVERY 8 HOURS AS NEEDED FOR NAUSEA OR VOMITING   PANTOPRAZOLE (PROTONIX) 40 MG TABLET    Take 1 tablet (40 mg total) by mouth daily.   POTASSIUM CHLORIDE SA (KLOR-CON) 20 MEQ TABLET    Take 2 tablets (40 mEq total) by mouth daily. 80MG  x3 days then back to 40 mg/day   TORSEMIDE (DEMADEX) 20 MG TABLET    Take 1 tablet (20 mg total) by mouth 2 (two) times daily. 40mg   BID x 3 days then back to 20mg  BID   VEMLIDY 25 MG TABS    Take 1 tablet (25 mg total) by mouth daily.   VITAMIN D, ERGOCALCIFEROL, (DRISDOL) 1.25 MG (50000 UNIT) CAPS CAPSULE    Take 1 capsule by mouth once a week  Modified Medications   No medications on file  Discontinued Medications   No medications on file      Past Medical History:  Diagnosis Date  . DDD (degenerative disc disease), lumbar    per his report  . Diabetes mellitus without complication ([place])   . Gout 04/16/2013  . Hyperlipidemia   . Hypertension   . IBD (inflammatory bowel disease) 04/05/2016  . Pulmonary embolism ([place])   . Tobacco use disorder 03/28/2015    Social History   Tobacco Use  . Smoking status: Former Smoker    Types: Cigarettes    Quit date: 10/23/2016    Years since quitting: 4.3  . Smokeless tobacco: Never Used  . Tobacco comment: per patient 5 cigarettes a day   Vaping Use  . Vaping Use: Never used  Substance Use Topics  . Alcohol  use: Not Currently    Comment: occasional  . Drug use: No    Family History  Problem Relation Age of Onset  . Hypertension Father   . Diabetes Father   . Stroke Father   . Pancreatic cancer Father   . Colon cancer Brother   . Esophageal cancer Neg Hx   . Stomach cancer Neg Hx     Allergies  Allergen Reactions  . Crestor [Rosuvastatin Calcium] Other (See Comments)    Joint aches    Review of Systems  Constitutional: Positive for malaise/fatigue. Negative for fever.  Respiratory: Negative.   Cardiovascular: Positive for leg swelling.  Gastrointestinal: Positive for abdominal pain.  Genitourinary: Negative.   Musculoskeletal: Negative.      OBJECTIVE:    Vitals:   02/20/21 1034  BP: 129/89  Pulse: 84  Temp: (!) 97.5 F (36.4 C)  TempSrc: Oral  SpO2: 96%  Weight: 212 lb (96.2 kg)  Height: 5\' 11"  (1.803 m)   Body mass index is 29.57 kg/m.  Physical Exam Constitutional:      Appearance: Normal appearance.  HENT:     Head: Normocephalic and atraumatic.  Pulmonary:     Effort: Pulmonary effort is normal. No respiratory distress.  Musculoskeletal:     Right lower leg: Edema present.     Left lower leg: Edema present.  Skin:    General: Skin is warm and dry.     Coloration: Skin is not jaundiced.  Neurological:     General: No focal deficit present.     Mental Status: He is alert and oriented to person, place, and time.  Psychiatric:        Mood and Affect: Mood normal.        Behavior: Behavior normal.      Labs and Microbiology: CBC Latest Ref Rng & Units 02/16/2021 02/09/2021 02/01/2021  WBC 4.0 - 10.5 K/uL 3.6(L) 4.0 4.5  Hemoglobin 13.0 - 17.0 g/dL 11.2(L) 11.2(L) 11.4(L)  Hematocrit 39.0 - 52.0 % 33.5(L) 33.4(L) 33.7(L)  Platelets 150 - 400 K/uL 136(L) 125(L) 104(L)   CMP Latest Ref Rng & Units 02/16/2021 02/09/2021 02/01/2021  Glucose 70 - 99 mg/dL 96 85 89  BUN 8 - 23 mg/dL 20 19 24(H)  Creatinine 0.61 - 1.24 mg/dL 1.04 1.09 1.01  Sodium 135 - 145  mmol/L 147(H) 146(H)  145  Potassium 3.5 - 5.1 mmol/L 3.5 3.5 3.5  Chloride 98 - 111 mmol/L 108 108 108  CO2 22 - 32 mmol/L 27 27 28   Calcium 8.9 - 10.3 mg/dL 7.5(L) 7.4(L) 7.4(L)  Total Protein 6.5 - 8.1 g/dL 4.1(L) 4.2(L) 4.0(L)  Total Bilirubin 0.3 - 1.2 mg/dL 0.4 0.4 0.4  Alkaline Phos 38 - 126 U/L 75 76 71  AST 15 - 41 U/L 32 27 21  ALT 0 - 44 U/L 43 43 33     No results found for this or any previous visit (from the past 240 hour(s)).     ASSESSMENT & PLAN:    Chronic viral hepatitis B without delta-agent ([place]) Patient continues on TAF for treatment of chronic HBV.  DNA PCR last week with 4,500 copies down from 122,000 when last checked in October 2021.  LFTs are normal.  He is tolerating TAF without any side effects but does endorse fatigue which is likely due to chemotherapy and amyloidosis.  Will continue TAF and have patient follow up in 3 months with labs.  Will obtain liver US for [facility] screening now (last US done in October 2021)  Amyloidosis [facility]) He is followed by Dr Lorenso Courier at the [facility] and currently on chemotherapy.  Discussed continued treatment of HBV especially while receiving chemotherapy and continued hematology/oncology follow up.   Orders Placed This Encounter  Procedures  . US Abdomen Limited RUQ (LIVER/GB)    Standing Status:   Future    Standing Expiration Date:   02/20/2022    Order Specific Question:   Reason for Exam (SYMPTOM  OR DIAGNOSIS REQUIRED)    Answer:   chronic HBV, [place] screening    Order Specific Question:   Preferred imaging location?    Answer:   [facility] 02/20/2021, 10:56 AM

## 2021-02-20 NOTE — Patient Instructions (Signed)
Thank you for coming to see me today. It was a pleasure seeing you.  To Do: Marland Kitchen Continue vemlidy for hepatitis B . Liver Ultrasound for cancer screening . Follow up with me in about 3 months  If you have any questions or concerns, please do not hesitate to call the office at [phone].  Take Care,   Jule Ser, DO

## 2021-02-20 NOTE — Assessment & Plan Note (Signed)
He is followed by Dr Lorenso Courier at the [facility] and currently on chemotherapy.  Discussed continued treatment of HBV especially while receiving chemotherapy and continued hematology/oncology follow up.

## 2021-02-20 NOTE — Assessment & Plan Note (Signed)
Patient continues on TAF for treatment of chronic HBV.  DNA PCR last week with 4,500 copies down from 122,000 when last checked in October 2021.  LFTs are normal.  He is tolerating TAF without any side effects but does endorse fatigue which is likely due to chemotherapy and amyloidosis.  Will continue TAF and have patient follow up in 3 months with labs.  Will obtain liver US for [facility] screening now (last US done in October 2021)

## 2021-02-23 ENCOUNTER — Inpatient Hospital Stay: Payer: BC Managed Care – PPO | Admitting: Dietician

## 2021-02-23 ENCOUNTER — Encounter: Payer: Self-pay | Admitting: Hematology and Oncology

## 2021-02-23 ENCOUNTER — Other Ambulatory Visit: Payer: Self-pay

## 2021-02-23 ENCOUNTER — Inpatient Hospital Stay (HOSPITAL_BASED_OUTPATIENT_CLINIC_OR_DEPARTMENT_OTHER): Payer: BC Managed Care – PPO | Admitting: Hematology and Oncology

## 2021-02-23 ENCOUNTER — Inpatient Hospital Stay: Payer: BC Managed Care – PPO

## 2021-02-23 VITALS — BP 136/99 | HR 85 | Temp 97.1°F | Resp 18 | Ht 71.0 in | Wt 206.9 lb

## 2021-02-23 DIAGNOSIS — N049 Nephrotic syndrome with unspecified morphologic changes: Secondary | ICD-10-CM

## 2021-02-23 DIAGNOSIS — E8581 Light chain (AL) amyloidosis: Secondary | ICD-10-CM

## 2021-02-23 DIAGNOSIS — E854 Organ-limited amyloidosis: Secondary | ICD-10-CM | POA: Diagnosis not present

## 2021-02-23 DIAGNOSIS — Z5112 Encounter for antineoplastic immunotherapy: Secondary | ICD-10-CM | POA: Diagnosis not present

## 2021-02-23 LAB — CMP (CANCER CENTER ONLY)
ALT: 34 U/L (ref 0–44)
AST: 30 U/L (ref 15–41)
Albumin: 1.6 g/dL — ABNORMAL LOW (ref 3.5–5.0)
Alkaline Phosphatase: 79 U/L (ref 38–126)
Anion gap: 10 (ref 5–15)
BUN: 21 mg/dL (ref 8–23)
CO2: 28 mmol/L (ref 22–32)
Calcium: 7.6 mg/dL — ABNORMAL LOW (ref 8.9–10.3)
Chloride: 109 mmol/L (ref 98–111)
Creatinine: 0.98 mg/dL (ref 0.61–1.24)
GFR, Estimated: >60 mL/min (ref >60)
Glucose, Bld: 88 mg/dL (ref 70–99)
Potassium: 3.3 mmol/L — ABNORMAL LOW (ref 3.5–5.1)
Sodium: 147 mmol/L — ABNORMAL HIGH (ref 135–145)
Total Bilirubin: 0.3 mg/dL (ref 0.3–1.2)
Total Protein: 4.2 g/dL — ABNORMAL LOW (ref 6.5–8.1)

## 2021-02-23 LAB — CBC WITH DIFFERENTIAL (CANCER CENTER ONLY)
Abs Immature Granulocytes: 0.01 10*3/uL (ref 0.00–0.07)
Basophils Absolute: 0 10*3/uL (ref 0.0–0.1)
Basophils Relative: 0 %
Eosinophils Absolute: 0.2 10*3/uL (ref 0.0–0.5)
Eosinophils Relative: 4 %
HCT: 34 % — ABNORMAL LOW (ref 39.0–52.0)
Hemoglobin: 11.4 g/dL — ABNORMAL LOW (ref 13.0–17.0)
Immature Granulocytes: 0 %
Lymphocytes Relative: 16 %
Lymphs Abs: 0.6 10*3/uL — ABNORMAL LOW (ref 0.7–4.0)
MCH: 33.1 pg (ref 26.0–34.0)
MCHC: 33.5 g/dL (ref 30.0–36.0)
MCV: 98.8 fL (ref 80.0–100.0)
Monocytes Absolute: 0.4 10*3/uL (ref 0.1–1.0)
Monocytes Relative: 9 %
Neutro Abs: 2.7 10*3/uL (ref 1.7–7.7)
Neutrophils Relative %: 71 %
Platelet Count: 128 10*3/uL — ABNORMAL LOW (ref 150–400)
RBC: 3.44 MIL/uL — ABNORMAL LOW (ref 4.22–5.81)
RDW: 15.3 % (ref 11.5–15.5)
WBC Count: 3.9 10*3/uL — ABNORMAL LOW (ref 4.0–10.5)
nRBC: 0 % (ref 0.0–0.2)

## 2021-02-23 LAB — LACTATE DEHYDROGENASE: LDH: 287 U/L — ABNORMAL HIGH (ref 98–192)

## 2021-02-23 MED ORDER — DEXAMETHASONE 4 MG PO TABS
40.0000 mg | ORAL_TABLET | Freq: Once | ORAL | Status: AC
  Administered 2021-02-23: 40 mg via ORAL

## 2021-02-23 MED ORDER — BORTEZOMIB CHEMO SQ INJECTION 3.5 MG (2.5MG/ML)
1.3000 mg/m2 | Freq: Once | INTRAMUSCULAR | Status: AC
  Administered 2021-02-23: 3 mg via SUBCUTANEOUS
  Filled 2021-02-23: qty 1.2

## 2021-02-23 MED ORDER — DEXAMETHASONE 4 MG PO TABS
ORAL_TABLET | ORAL | Status: AC
  Filled 2021-02-23: qty 10

## 2021-02-23 MED ORDER — ACETAMINOPHEN 325 MG PO TABS
650.0000 mg | ORAL_TABLET | Freq: Once | ORAL | Status: AC
  Administered 2021-02-23: 650 mg via ORAL

## 2021-02-23 MED ORDER — ACETAMINOPHEN 325 MG PO TABS
ORAL_TABLET | ORAL | Status: AC
  Filled 2021-02-23: qty 2

## 2021-02-23 MED ORDER — DARATUMUMAB-HYALURONIDASE-FIHJ 1800-30000 MG-UT/15ML ~~LOC~~ SOLN
1800.0000 mg | Freq: Once | SUBCUTANEOUS | Status: AC
  Administered 2021-02-23: 1800 mg via SUBCUTANEOUS
  Filled 2021-02-23: qty 15

## 2021-02-23 MED ORDER — DIPHENHYDRAMINE HCL 25 MG PO CAPS
ORAL_CAPSULE | ORAL | Status: AC
  Filled 2021-02-23: qty 2

## 2021-02-23 MED ORDER — SODIUM CHLORIDE 0.9 % IV SOLN
INTRAVENOUS | Status: DC
  Filled 2021-02-23: qty 250

## 2021-02-23 MED ORDER — SODIUM CHLORIDE 0.9 % IV SOLN
300.0000 mg/m2 | Freq: Once | INTRAVENOUS | Status: AC
  Administered 2021-02-23: 680 mg via INTRAVENOUS
  Filled 2021-02-23: qty 34

## 2021-02-23 MED ORDER — DIPHENHYDRAMINE HCL 25 MG PO CAPS
50.0000 mg | ORAL_CAPSULE | Freq: Once | ORAL | Status: AC
  Administered 2021-02-23: 50 mg via ORAL

## 2021-02-23 NOTE — Progress Notes (Signed)
[facility] [phone]   [phone]  PROGRESS NOTE  Patient Care Team: Isaac Bliss, Rayford Halsted, MD as PCP - General (Internal Medicine) Lorretta Harp, MD as PCP - Cardiology (Cardiology)  Hematological/Oncological History # AL Amyloidosis of the Kidney 1) 08/03/2020: Free kappa 19.5, Lambda 205.7, Kappa/Lambda ratio: 0.09. M protein undetectable in serum.  2) 08/07/2020: kidney biopsy performed, results consistent with lambda light chain AL amyloidosis 3) 08/23/2020: establish care with Dr. Lorenso Courier  4) 09/01/2020: bone marrow biopsy shows plasma cell neoplasm with focal amyloid deposits 5) 09/29/2020: start of Cycle 1 Day 1 of Dara-CyBorD 6) 11/02/2020: Cycle 2 Day 1 of Dara-CyBorD 7) 12/01/2020: Cycle 3 Day 1 of Dara-CyBorD 8) 12/29/2020: Cycle 4 Day 1 of Dara-CyBorD 9) 01/05/2021: Holding Velcade due to diarrhea.  10) 01/19/2021: Adding back Velcade due to improvement of diarrhea with imodium 11) 01/26/2021: Cycle 5 Day 1 of Dara-CyBorD 12) 02/23/2021: Cycle 6 Day 1 of Dara-CyBorD  Interval History:  Samuel Butler 60 y.o. male with medical history significant for AL amyloidosis of the kidney presents for a follow up visit. The patient's last visit was on 02/09/2021. In the interim since his last visit he has continued his treatment of Dara-CyBorD.   On exam today Samuel Butler notes that he is doing well.  He notes that he does continue to have some stomach upset and is currently scheduled for an endoscopy on 02/26/2021.  He notes that his stomach makes a lot of noise and he does have some occasional loose stools.  He is otherwise tolerating chemotherapy very well with no nausea, vomiting, or neuropathy.  He is tolerating blood thinner well with no bleeding, bruising, or dark stools, though he does endorse some occasional red blood on the toilet paper.Marland Kitchen  He denies any fevers, chills, night sweats, worsening shortness of breath, chest pain or cough.  He has  no other complaints.  A full 10 point ROS is listed below.  MEDICAL HISTORY:  Past Medical History:  Diagnosis Date  . DDD (degenerative disc disease), lumbar    per his report  . Diabetes mellitus without complication ([place])   . Gout 04/16/2013  . Hyperlipidemia   . Hypertension   . IBD (inflammatory bowel disease) 04/05/2016  . Pulmonary embolism ([place])   . Tobacco use disorder 03/28/2015    SURGICAL HISTORY: Past Surgical History:  Procedure Laterality Date  . ABDOMINAL AORTOGRAM W/LOWER EXTREMITY Right 06/26/2020   Procedure: ABDOMINAL AORTOGRAM W/LOWER EXTREMITY;  Surgeon: Lorretta Harp, MD;  Location: [facility];  Service: Cardiovascular;  Laterality: Right;  . COLONOSCOPY    . PERIPHERAL VASCULAR INTERVENTION  06/26/2020   Procedure: PERIPHERAL VASCULAR INTERVENTION;  Surgeon: Lorretta Harp, MD;  Location: [facility];  Service: Cardiovascular;;  Right SFA  . TONSILLECTOMY      SOCIAL HISTORY: Social History   Socioeconomic History  . Marital status: Married    Spouse name: Not on file  . Number of children: Not on file  . Years of education: Not on file  . Highest education level: Not on file  Occupational History  . Not on file  Tobacco Use  . Smoking status: Former Smoker    Types: Cigarettes    Quit date: 10/23/2016    Years since quitting: 4.3  . Smokeless tobacco: Never Used  . Tobacco comment: per patient 5 cigarettes a day   Vaping Use  . Vaping Use: Never used  Substance and Sexual Activity  .  Alcohol use: Not Currently    Comment: occasional  . Drug use: No  . Sexual activity: Not on file  Other Topics Concern  . 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 Difficulty of  Paying Living Expenses: Somewhat hard  Food Insecurity: No Food Insecurity  . Worried About Charity fundraiser in the Last Year: Never true  . Ran Out of Food in the Last Year: Never true  Transportation Needs: No Transportation Needs  . Lack of Transportation (Medical): No  . Lack of Transportation (Non-Medical): No  Physical Activity: Not on file  Stress: Not on file  Social Connections: Not on file  Intimate Partner Violence: Not on file    FAMILY HISTORY: Family History  Problem Relation Age of Onset  . Hypertension Father   . Diabetes Father   . Stroke Father   . Pancreatic cancer Father   . Colon cancer Brother   . Esophageal cancer Neg Hx   . Stomach cancer Neg Hx     ALLERGIES:  is allergic to crestor [rosuvastatin calcium].  MEDICATIONS:  Current Outpatient Medications  Medication Sig Dispense Refill  . acetaminophen (TYLENOL) 650 MG CR tablet Take 1,300 mg by mouth every 8 (eight) hours as needed for pain.     Marland Kitchen acyclovir (ZOVIRAX) 400 MG tablet Take 1 tablet (400 mg total) by mouth 2 (two) times daily. 60 tablet 3  . albuterol (VENTOLIN HFA) 108 (90 Base) MCG/ACT inhaler Inhale 2 puffs into the lungs every 6 (six) hours as needed for wheezing or shortness of breath. 8 g 0  . apixaban (ELIQUIS) 5 MG TABS tablet Take 1 tablet (5 mg total) by mouth 2 (two) times daily. 180 tablet 1  . atorvastatin (LIPITOR) 80 MG tablet Take 1 tablet (80 mg total) by mouth daily. 90 tablet 2  . clopidogrel (PLAVIX) 75 MG tablet Take 1 tablet (75 mg total) by mouth daily with breakfast. 90 tablet 3  . dicyclomine (BENTYL) 20 MG tablet Take 1 tablet (20 mg total) by mouth 4 (four) times daily -  before meals and at bedtime. Take 20-30 minutes before meals. 120 tablet 2  . Evolocumab (REPATHA SURECLICK) 956 MG/ML SOAJ Inject 140 mg into the skin every 14 (fourteen) days. 2 mL 12  . ezetimibe (ZETIA) 10 MG tablet Take 1 tablet (10 mg total) by mouth daily. 90 tablet 3  . isosorbide  mononitrate (IMDUR) 30 MG 24 hr tablet Take 1 tablet by mouth once daily 90 tablet 1  . losartan (COZAAR) 50 MG tablet Take 1 tablet (50 mg total) by mouth daily. 90 tablet 3  . metFORMIN (GLUCOPHAGE) 500 MG tablet Take 1 tablet by mouth once daily with breakfast 90 tablet 1  . ondansetron (ZOFRAN) 8 MG tablet TAKE 1 TABLET BY MOUTH EVERY 8 HOURS AS NEEDED FOR NAUSEA OR VOMITING 30 tablet 0  . pantoprazole (PROTONIX) 40 MG tablet Take 1 tablet (40 mg total) by mouth daily. 90 tablet 1  . potassium chloride SA (KLOR-CON) 20 MEQ tablet Take 2 tablets (40 mEq total) by mouth daily. 80MG x3 days then back to 40 mg/day 33  tablet 6  . torsemide (DEMADEX) 20 MG tablet Take 1 tablet (20 mg total) by mouth 2 (two) times daily. 14m BID x 3 days then back to 288mBID 33 tablet 6  . VEMLIDY 25 MG TABS Take 1 tablet (25 mg total) by mouth daily. 30 tablet 5  . Vitamin D, Ergocalciferol, (DRISDOL) 1.25 MG (50000 UNIT) CAPS capsule Take 1 capsule by mouth once a week 12 capsule 0   No current facility-administered medications for this visit.    REVIEW OF SYSTEMS:   Constitutional: ( - ) fevers, ( - )  chills , ( - ) night sweats Eyes: ( - ) blurriness of vision, ( - ) double vision, ( - ) watery eyes Ears, nose, mouth, throat, and face: ( - ) mucositis, ( - ) sore throat Respiratory: ( - ) cough, ( - ) dyspnea, ( - ) wheezes Cardiovascular: ( - ) palpitation, ( - ) chest discomfort, ( - ) lower extremity swelling Gastrointestinal:  ( - ) nausea, ( - ) heartburn, ( - ) change in bowel habits Skin: ( - ) abnormal skin rashes Lymphatics: ( - ) new lymphadenopathy, ( - ) easy bruising Neurological: (+) numbness, ( - ) tingling, ( - ) new weaknesses Behavioral/Psych: ( - ) mood change, ( - ) new changes  All other systems were reviewed with the patient and are negative.  PHYSICAL EXAMINATION: ECOG PERFORMANCE STATUS: 1 - Symptomatic but completely ambulatory  Vitals:   02/23/21 0947  BP: (!) 136/99   Pulse: 85  Resp: 18  Temp: (!) 97.1 F (36.2 C)  SpO2: 98%   Filed Weights   02/23/21 0947  Weight: 206 lb 14.4 oz (93.8 kg)    GENERAL: well appearing middle aged AfSerbiamerican male alert, no distress and comfortable SKIN: skin color, texture, turgor are normal, no rashes or significant lesions EYES: conjunctiva are pink and non-injected, sclera clear LUNGS: clear to auscultation and percussion with normal breathing effort HEART: regular rate & rhythm and no murmurs and +2 lower extremity edema bilaterally (L > R) Musculoskeletal: no cyanosis of digits and no clubbing  PSYCH: alert & oriented x 3, fluent speech NEURO: no focal motor/sensory deficits  LABORATORY DATA:  I have reviewed the data as listed CBC Latest Ref Rng & Units 02/23/2021 02/16/2021 02/09/2021  WBC 4.0 - 10.5 K/uL 3.9(L) 3.6(L) 4.0  Hemoglobin 13.0 - 17.0 g/dL 11.4(L) 11.2(L) 11.2(L)  Hematocrit 39.0 - 52.0 % 34.0(L) 33.5(L) 33.4(L)  Platelets 150 - 400 K/uL 128(L) 136(L) 125(L)    CMP Latest Ref Rng & Units 02/23/2021 02/16/2021 02/09/2021  Glucose 70 - 99 mg/dL 88 96 85  BUN 8 - 23 mg/dL _0 Creatinine 0.61 - 1.24 mg/dL 0.98 1.04 1.09  Sodium 135 - 145 mmol/L 147(H) 147(H) 146(H)  Potassium 3.5 - 5.1 mmol/L 3.3(L) 3.5 3.5  Chloride 98 - 111 mmol/L 109 108 108  CO2 22 - 32 mmol/L _1 Calcium 8.9 - 10.3 mg/dL 7.6(L) 7.5(L) 7.4(L)  Total Protein 6.5 - 8.1 g/dL 4.2(L) 4.1(L) 4.2(L)  Total Bilirubin 0.3 - 1.2 mg/dL 0.3 0.4 0.4  Alkaline Phos 38 - 126 U/L 79 75 76  AST 15 - 41 U/L 30 32 27  ALT 0 - 44 U/L 34 43 43    Lab Results  Component Value Date   MPROTEIN 0.1 (H) 02/09/2021   MPROTEIN Not Observed 01/12/2021   MPROTEIN 0.1 (H) 12/15/2020   Lab Results  Component  Value Date   KPAFRELGTCHN 10.2 02/09/2021   KPAFRELGTCHN 11.7 01/12/2021   KPAFRELGTCHN 15.9 12/15/2020   LAMBDASER 13.7 02/09/2021   LAMBDASER 16.9 01/12/2021   LAMBDASER 21.7 12/15/2020   KAPLAMBRATIO 0.74 02/09/2021    KAPLAMBRATIO 0.69 01/12/2021   KAPLAMBRATIO 0.83 (L) 01/09/2021     RADIOGRAPHIC STUDIES: No results found.  ASSESSMENT & PLAN Samuel Butler 60 y.o. male with medical history significant for AL amyloidosis of the kidney presents for a follow up visit.  After review the labs, the records, discussion with the patient the findings are most consistent with AL amyloidosis affecting the kidney.  As such I would recommended we start treatment with Dara CyBorD.  After reviewing the biopsy results his findings are most consistent with AL amyloidosis. As such the treatment of choice would be to target his plasma cell population with a triplet or quadruplet therapy. Therapy of choice in this case would consist of daratumumab, Velcade, cyclophosphamide, and dexamethasone. Given the patient's good functional status we will start with full dose Dara-CyBorD. I previously discussed the side effects of this chemotherapy with the patient including neuropathy, elevated blood pressure, drop in blood counts, hypersensitivity reaction, chest tightness, increased infection risk, and fatigue. The patient and family voiced their understanding of these findings and are agreeable to moving forward with quadruple therapy with the goal of being a bridge to bone marrow transplant.   The regimen of choice is daratumumab, bortezomib, cyclophosphamide and dexamethasone per the ANDROMEDA Study ( Blood. 2020 Jul 2;136(1):71-80). Treatment consists of: Cyclophosphamide 300 mg/m2 intravenously and bortezomib 1.3 mg/m2 subcutaneously were given on days 1, 8, 15, and 22 of each 28 day cycle for up to 6 cycles. Dexamethasone 40 mg (starting dose) was given orally or intravenously weekly for each cycle for up to 6 cycles. DARA [place] was administered in a single, premixed vial and given by manual Neopit injection over the course of 3 to 5 minutes weekly in cycles 1 to 2, every 2 weeks in cycles 3 to 6, and every 4 weeks thereafter as  monotherapy for a maximum of 2 years. We would consider this continued dosing regimen if patient declined or was determined not to be a candidate for BMT.   On exam today,  Samuel Butler is currently at his baseline. He continues to have GI upset, but is scheduled for a colonoscopy on 03/28/2021.  Labs from today were reviewed without any intervention needed.  Patient will proceed with treatment as planned.  Patient will return to the clinic in 2 weeks as scheduled.  #AL Amyloidosis Affecting the Kidney --bone marrow biopsy and kidney biopsy helped to confirm the diagnosis of AL amyloidosis. --currently being treated with  Dara-CyBorD (per the [facility] Trial), started Cycle 1 Day 1 on 09/29/2020.  --today is Cycle 6 Day 15 of treatment. Starting Cycle 7 the patient will only require monthly injections.  --added back Velcade due to improvement of diarrhea on imodium.  --will monitoring monthly SPEP, UPEP, and SFLC while on treatment. --will have patient return for weekly treatment with q 2 week clinic visits with interval weekly treatment  #Diarrhea with abdominal cramping --Improved with Imodium and Bentyl, but still has some persistent symptoms.  --GI scheduled EGD/Colonoscopy on 03/28/2021   #Weight Loss, improving --Diarrhea combined with diuresis is likely causing his weight loss --weight decreased to 206 on 02/23/2021, down 10 lbs from 02/09/2021. --continue to monitor  #Injection Site Rash, improved --appears benign on exam today  --completed antibiotic therapy  #Supportive  Care --chemotherapy education performed --zofran 57m q8H PRN and compazine 17mPO q6H for nausea --acyclovir 40059mO BID for VCZ prophylaxis --albuterol HFA inhaler for daratumumab treatment -- EMLA cream for port not required, patient declines port at this time, but will reconsider if patient desires.  -- no pain medication required at this time.   No orders of the defined types were placed in this  encounter.   All questions were answered. The patient knows to call the clinic with any problems, questions or concerns.  A total of more than 30 minutes were spent on this encounter and over half of that time was spent on counseling and coordination of care as outlined above.   JohLedell PeoplesD Department of Hematology/Oncology [facility] Hospitalone: [phone]: [phone]: [email or web address]  02/23/2021 10:45 AM   Literature Support:  V, Clarita Craneasey SY, Weiss BM, VerRush Landmarkerlini G, Comenzo RL. Daratumumab plus CyBorD for patients with newly diagnosed AL amyloidosis: safety run-in results of ANDROMEDA. Blood. 2020 Jul 2;136(1):71-80.  --Daratumumab-CyBorD was well tolerated, with no new safety concerns versus the intravenous formulation, and demonstrated robust hematologic and organ responses.

## 2021-02-23 NOTE — Patient Instructions (Signed)
[facility] Discharge Instructions for Patients Receiving Chemotherapy  Today you received the following chemotherapy agents Cyclophosphamide (CYTOXAN), Velcade , Darzalex Faspro.   To help prevent nausea and vomiting after your treatment, we encourage you to take your nausea medication as prescribed.   If you develop nausea and vomiting that is not controlled by your nausea medication, call the clinic.   BELOW ARE SYMPTOMS THAT SHOULD BE REPORTED IMMEDIATELY:  *FEVER GREATER THAN 100.5 F  *CHILLS WITH OR WITHOUT FEVER  NAUSEA AND VOMITING THAT IS NOT CONTROLLED WITH YOUR NAUSEA MEDICATION  *UNUSUAL SHORTNESS OF BREATH  *UNUSUAL BRUISING OR BLEEDING  TENDERNESS IN MOUTH AND THROAT WITH OR WITHOUT PRESENCE OF ULCERS  *URINARY PROBLEMS  *BOWEL PROBLEMS  UNUSUAL RASH Items with * indicate a potential emergency and should be followed up as soon as possible.  Feel free to call the clinic should you have any questions or concerns. The clinic phone number is [phone].  Please show the [place] at check-in to the Emergency Department and triage nurse.

## 2021-02-26 ENCOUNTER — Other Ambulatory Visit: Payer: Self-pay | Admitting: *Deleted

## 2021-02-26 DIAGNOSIS — Z5112 Encounter for antineoplastic immunotherapy: Secondary | ICD-10-CM | POA: Diagnosis not present

## 2021-02-26 DIAGNOSIS — E8581 Light chain (AL) amyloidosis: Secondary | ICD-10-CM

## 2021-02-28 LAB — UPEP/UIFE/LIGHT CHAINS/TP, 24-HR UR
% BETA, Urine: 7.9 %
ALPHA 1 URINE: 1.7 %
Albumin, U: 86.2 %
Alpha 2, Urine: 3.5 %
Free Kappa Lt Chains,Ur: 8.17 mg/L (ref 1.17–86.46)
Free Kappa/Lambda Ratio: 1.51 — ABNORMAL LOW (ref 1.83–14.26)
Free Lambda Lt Chains,Ur: 5.42 mg/L (ref 0.27–15.21)
GAMMA GLOBULIN URINE: 0.7 %
Total Protein, Urine-Ur/day: 2702 mg/24 hr — ABNORMAL HIGH (ref 30–150)
Total Protein, Urine: 142.2 mg/dL
Total Volume: 1900

## 2021-03-01 ENCOUNTER — Encounter: Payer: Self-pay | Admitting: Physical Therapy

## 2021-03-01 ENCOUNTER — Ambulatory Visit (HOSPITAL_COMMUNITY)
Admission: RE | Admit: 2021-03-01 | Discharge: 2021-03-01 | Disposition: A | Discharge status: Home / Self Care | Payer: BC Managed Care – PPO | Source: Ambulatory Visit | Attending: Internal Medicine | Admitting: Internal Medicine

## 2021-03-01 ENCOUNTER — Other Ambulatory Visit: Payer: Self-pay

## 2021-03-01 DIAGNOSIS — B181 Chronic viral hepatitis B without delta-agent: Secondary | ICD-10-CM | POA: Diagnosis present

## 2021-03-02 ENCOUNTER — Inpatient Hospital Stay: Payer: BC Managed Care – PPO | Admitting: Dietician

## 2021-03-02 ENCOUNTER — Inpatient Hospital Stay: Payer: BC Managed Care – PPO

## 2021-03-02 VITALS — BP 112/85 | HR 98 | Temp 98.8°F | Resp 16 | Ht 71.0 in | Wt 206.8 lb

## 2021-03-02 DIAGNOSIS — E8581 Light chain (AL) amyloidosis: Secondary | ICD-10-CM

## 2021-03-02 DIAGNOSIS — Z5112 Encounter for antineoplastic immunotherapy: Secondary | ICD-10-CM | POA: Diagnosis not present

## 2021-03-02 LAB — CBC WITH DIFFERENTIAL (CANCER CENTER ONLY)
Abs Immature Granulocytes: 0.02 10*3/uL (ref 0.00–0.07)
Basophils Absolute: 0 10*3/uL (ref 0.0–0.1)
Basophils Relative: 0 %
Eosinophils Absolute: 0.2 10*3/uL (ref 0.0–0.5)
Eosinophils Relative: 3 %
HCT: 37.4 % — ABNORMAL LOW (ref 39.0–52.0)
Hemoglobin: 12.3 g/dL — ABNORMAL LOW (ref 13.0–17.0)
Immature Granulocytes: 0 %
Lymphocytes Relative: 11 %
Lymphs Abs: 0.6 10*3/uL — ABNORMAL LOW (ref 0.7–4.0)
MCH: 33.3 pg (ref 26.0–34.0)
MCHC: 32.9 g/dL (ref 30.0–36.0)
MCV: 101.4 fL — ABNORMAL HIGH (ref 80.0–100.0)
Monocytes Absolute: 0.4 10*3/uL (ref 0.1–1.0)
Monocytes Relative: 8 %
Neutro Abs: 4.5 10*3/uL (ref 1.7–7.7)
Neutrophils Relative %: 78 %
Platelet Count: 110 10*3/uL — ABNORMAL LOW (ref 150–400)
RBC: 3.69 MIL/uL — ABNORMAL LOW (ref 4.22–5.81)
RDW: 15.1 % (ref 11.5–15.5)
WBC Count: 5.8 10*3/uL (ref 4.0–10.5)
nRBC: 0 % (ref 0.0–0.2)

## 2021-03-02 LAB — CMP (CANCER CENTER ONLY)
ALT: 51 U/L — ABNORMAL HIGH (ref 0–44)
AST: 39 U/L (ref 15–41)
Albumin: 1.7 g/dL — ABNORMAL LOW (ref 3.5–5.0)
Alkaline Phosphatase: 82 U/L (ref 38–126)
Anion gap: 9 (ref 5–15)
BUN: 21 mg/dL (ref 8–23)
CO2: 30 mmol/L (ref 22–32)
Calcium: 7.9 mg/dL — ABNORMAL LOW (ref 8.9–10.3)
Chloride: 107 mmol/L (ref 98–111)
Creatinine: 1.04 mg/dL (ref 0.61–1.24)
GFR, Estimated: >60 mL/min (ref >60)
Glucose, Bld: 111 mg/dL — ABNORMAL HIGH (ref 70–99)
Potassium: 3.5 mmol/L (ref 3.5–5.1)
Sodium: 146 mmol/L — ABNORMAL HIGH (ref 135–145)
Total Bilirubin: 0.3 mg/dL (ref 0.3–1.2)
Total Protein: 4.4 g/dL — ABNORMAL LOW (ref 6.5–8.1)

## 2021-03-02 LAB — LACTATE DEHYDROGENASE: LDH: 313 U/L — ABNORMAL HIGH (ref 98–192)

## 2021-03-02 MED ORDER — BORTEZOMIB CHEMO SQ INJECTION 3.5 MG (2.5MG/ML)
1.3000 mg/m2 | Freq: Once | INTRAMUSCULAR | Status: AC
  Administered 2021-03-02: 3 mg via SUBCUTANEOUS
  Filled 2021-03-02: qty 1.2

## 2021-03-02 MED ORDER — SODIUM CHLORIDE 0.9 % IV SOLN
300.0000 mg/m2 | Freq: Once | INTRAVENOUS | Status: AC
  Administered 2021-03-02: 680 mg via INTRAVENOUS
  Filled 2021-03-02: qty 34

## 2021-03-02 MED ORDER — DEXAMETHASONE 4 MG PO TABS
ORAL_TABLET | ORAL | Status: AC
  Filled 2021-03-02: qty 10

## 2021-03-02 MED ORDER — DEXAMETHASONE 4 MG PO TABS
40.0000 mg | ORAL_TABLET | Freq: Once | ORAL | Status: AC
  Administered 2021-03-02: 40 mg via ORAL

## 2021-03-02 NOTE — Progress Notes (Signed)
Nutrition Follow-up:  Patient with AL amyloidosis of kidney. He is receiving Cara-CYBorD  Met with patient during infusion. He reports ongoing diarrhea and abdominal cramping, bloating. Reports stools are oily and foul smelling. Patient reports EGD scheduled for May 18. He is eating small frequent meals and drinking 2-3 Ensure, lactose free milk and water. Patient reports diarrhea after drinking supplements. Yesterday he had a sausage biscuit for breakfast, reports diarrhea 15 minutes after eating.   Medications: reviewed  Labs: reviewed  Anthropometrics: Weight 206 lb 14.4 oz on 4/15 decreased 10 lbs (5%) in 2 weeks; significant  4/1 - 216 lb 6.4 oz 3/24 - 214 lb 3.2 oz  NUTRITION DIAGNOSIS: Unintended weight loss ongoing    INTERVENTION:  Reviewed strategies for managing diarrhea Encouraged lean meats, avoiding high fatty foods, low-fat diet handout provided Discussed strategies for improving Ensure tolerance (splitting intake of supplement over a few hours, cutting half with lactose free milk) EPI?? - Consider trial of PERT Note routed to MD    MONITORING, EVALUATION, GOAL: weight trends, intake   NEXT VISIT: Friday May 6 in infusion

## 2021-03-02 NOTE — Patient Instructions (Signed)
[facility] Discharge Instructions for Patients Receiving Chemotherapy  Today you received the following chemotherapy agents: Velcade, Cytoxan   To help prevent nausea and vomiting after your treatment, we encourage you to take your nausea medication as directed.    If you develop nausea and vomiting that is not controlled by your nausea medication, call the clinic.   BELOW ARE SYMPTOMS THAT SHOULD BE REPORTED IMMEDIATELY:  *FEVER GREATER THAN 100.5 F  *CHILLS WITH OR WITHOUT FEVER  NAUSEA AND VOMITING THAT IS NOT CONTROLLED WITH YOUR NAUSEA MEDICATION  *UNUSUAL SHORTNESS OF BREATH  *UNUSUAL BRUISING OR BLEEDING  TENDERNESS IN MOUTH AND THROAT WITH OR WITHOUT PRESENCE OF ULCERS  *URINARY PROBLEMS  *BOWEL PROBLEMS  UNUSUAL RASH Items with * indicate a potential emergency and should be followed up as soon as possible.  Feel free to call the clinic should you have any questions or concerns. The clinic phone number is [phone].  Please show the CHEMO ALERT CARD at check-in to the Emergency Department and triage nurse.   

## 2021-03-06 ENCOUNTER — Other Ambulatory Visit: Payer: Self-pay

## 2021-03-06 ENCOUNTER — Telehealth: Payer: Self-pay

## 2021-03-06 NOTE — Telephone Encounter (Signed)
Patient made aware of ultrasound results and accepts future follow up appointment with Dr. Juleen China.  Samuel Butler

## 2021-03-06 NOTE — Telephone Encounter (Signed)
-----   Message from Mignon Pine, DO sent at 03/06/2021  8:04 AM EDT ----- Please let patient know that liver US was stable and did not show any suspicious lesions for [place].

## 2021-03-07 ENCOUNTER — Telehealth: Payer: Self-pay | Admitting: Hematology and Oncology

## 2021-03-07 NOTE — Telephone Encounter (Signed)
Called and spoke with patient to inform of change in provider for 4/28 appt. Due to provider family emergency

## 2021-03-08 ENCOUNTER — Inpatient Hospital Stay: Payer: BC Managed Care – PPO

## 2021-03-08 ENCOUNTER — Other Ambulatory Visit: Payer: Self-pay

## 2021-03-08 ENCOUNTER — Inpatient Hospital Stay (HOSPITAL_BASED_OUTPATIENT_CLINIC_OR_DEPARTMENT_OTHER): Payer: BC Managed Care – PPO | Admitting: Physician Assistant

## 2021-03-08 ENCOUNTER — Inpatient Hospital Stay: Payer: BC Managed Care – PPO | Admitting: Hematology and Oncology

## 2021-03-08 VITALS — BP 142/97 | HR 83 | Temp 97.8°F | Resp 19 | Ht 71.0 in | Wt 202.9 lb

## 2021-03-08 DIAGNOSIS — E8581 Light chain (AL) amyloidosis: Secondary | ICD-10-CM

## 2021-03-08 DIAGNOSIS — Z5112 Encounter for antineoplastic immunotherapy: Secondary | ICD-10-CM | POA: Diagnosis not present

## 2021-03-08 LAB — CBC WITH DIFFERENTIAL (CANCER CENTER ONLY)
Abs Immature Granulocytes: 0.01 10*3/uL (ref 0.00–0.07)
Basophils Absolute: 0 10*3/uL (ref 0.0–0.1)
Basophils Relative: 0 %
Eosinophils Absolute: 0.1 10*3/uL (ref 0.0–0.5)
Eosinophils Relative: 2 %
HCT: 35.6 % — ABNORMAL LOW (ref 39.0–52.0)
Hemoglobin: 11.7 g/dL — ABNORMAL LOW (ref 13.0–17.0)
Immature Granulocytes: 0 %
Lymphocytes Relative: 16 %
Lymphs Abs: 0.7 10*3/uL (ref 0.7–4.0)
MCH: 33.5 pg (ref 26.0–34.0)
MCHC: 32.9 g/dL (ref 30.0–36.0)
MCV: 102 fL — ABNORMAL HIGH (ref 80.0–100.0)
Monocytes Absolute: 0.3 10*3/uL (ref 0.1–1.0)
Monocytes Relative: 7 %
Neutro Abs: 3.2 10*3/uL (ref 1.7–7.7)
Neutrophils Relative %: 75 %
Platelet Count: 126 10*3/uL — ABNORMAL LOW (ref 150–400)
RBC: 3.49 MIL/uL — ABNORMAL LOW (ref 4.22–5.81)
RDW: 15.1 % (ref 11.5–15.5)
WBC Count: 4.3 10*3/uL (ref 4.0–10.5)
nRBC: 0 % (ref 0.0–0.2)

## 2021-03-08 LAB — CMP (CANCER CENTER ONLY)
ALT: 39 U/L (ref 0–44)
AST: 30 U/L (ref 15–41)
Albumin: 1.7 g/dL — ABNORMAL LOW (ref 3.5–5.0)
Alkaline Phosphatase: 80 U/L (ref 38–126)
Anion gap: 9 (ref 5–15)
BUN: 20 mg/dL (ref 8–23)
CO2: 28 mmol/L (ref 22–32)
Calcium: 7.7 mg/dL — ABNORMAL LOW (ref 8.9–10.3)
Chloride: 108 mmol/L (ref 98–111)
Creatinine: 1.01 mg/dL (ref 0.61–1.24)
GFR, Estimated: >60 mL/min (ref >60)
Glucose, Bld: 86 mg/dL (ref 70–99)
Potassium: 3.2 mmol/L — ABNORMAL LOW (ref 3.5–5.1)
Sodium: 145 mmol/L (ref 135–145)
Total Bilirubin: 0.4 mg/dL (ref 0.3–1.2)
Total Protein: 4.3 g/dL — ABNORMAL LOW (ref 6.5–8.1)

## 2021-03-08 LAB — LACTATE DEHYDROGENASE: LDH: 320 U/L — ABNORMAL HIGH (ref 98–192)

## 2021-03-08 MED ORDER — ACETAMINOPHEN 325 MG PO TABS
650.0000 mg | ORAL_TABLET | Freq: Once | ORAL | Status: AC
  Administered 2021-03-08: 650 mg via ORAL

## 2021-03-08 MED ORDER — ACETAMINOPHEN 325 MG PO TABS
ORAL_TABLET | ORAL | Status: AC
  Filled 2021-03-08: qty 2

## 2021-03-08 MED ORDER — DIPHENHYDRAMINE HCL 25 MG PO CAPS
ORAL_CAPSULE | ORAL | Status: AC
  Filled 2021-03-08: qty 2

## 2021-03-08 MED ORDER — DIPHENHYDRAMINE HCL 25 MG PO CAPS
50.0000 mg | ORAL_CAPSULE | Freq: Once | ORAL | Status: AC
  Administered 2021-03-08: 50 mg via ORAL

## 2021-03-08 MED ORDER — DARATUMUMAB-HYALURONIDASE-FIHJ 1800-30000 MG-UT/15ML ~~LOC~~ SOLN
1800.0000 mg | Freq: Once | SUBCUTANEOUS | Status: AC
  Administered 2021-03-08: 1800 mg via SUBCUTANEOUS
  Filled 2021-03-08: qty 15

## 2021-03-08 MED ORDER — CYCLOPHOSPHAMIDE CHEMO INJECTION 1 GM
300.0000 mg/m2 | Freq: Once | INTRAMUSCULAR | Status: AC
  Administered 2021-03-08: 680 mg via INTRAVENOUS
  Filled 2021-03-08: qty 34

## 2021-03-08 MED ORDER — DEXAMETHASONE 4 MG PO TABS
40.0000 mg | ORAL_TABLET | Freq: Once | ORAL | Status: AC
  Administered 2021-03-08: 40 mg via ORAL

## 2021-03-08 MED ORDER — SODIUM CHLORIDE 0.9 % IV SOLN
Freq: Once | INTRAVENOUS | Status: AC
  Filled 2021-03-08: qty 250

## 2021-03-08 MED ORDER — BORTEZOMIB CHEMO SQ INJECTION 3.5 MG (2.5MG/ML)
1.3000 mg/m2 | Freq: Once | INTRAMUSCULAR | Status: AC
  Administered 2021-03-08: 3 mg via SUBCUTANEOUS
  Filled 2021-03-08: qty 1.2

## 2021-03-08 MED ORDER — DEXAMETHASONE 4 MG PO TABS
ORAL_TABLET | ORAL | Status: AC
  Filled 2021-03-08: qty 10

## 2021-03-08 NOTE — Patient Instructions (Signed)
[facility]COL[facility]  Discharge Instructions: Thank you for choosing [facility] to provide your oncology and hematology care.   If you have a lab appointment with the [facility], please go directly to the [facility] and check in at the registration area.  We strive to give you quality time with your provider. You may need to reschedule your appointment if you arrive late (15 or more minutes).  Arriving late affects you and other patients whose appointments are after yours.  Also, if you miss three or more appointments without notifying the office, you may be dismissed from the clinic at the provider's discretion.      For prescription refill requests, have your pharmacy contact our office and allow 72 hours for refills to be completed.    Today you received the following chemotherapy and/or immunotherapy agents: bortezemib, cytoxan, darzalex faspro.      To help prevent nausea and vomiting after your treatment, we encourage you to take your nausea medication as directed.  BELOW ARE SYMPTOMS THAT SHOULD BE REPORTED IMMEDIATELY: . *FEVER GREATER THAN 100.4 F (38 C) OR HIGHER . *CHILLS OR SWEATING . *NAUSEA AND VOMITING THAT IS NOT CONTROLLED WITH YOUR NAUSEA MEDICATION . *UNUSUAL SHORTNESS OF BREATH . *UNUSUAL BRUISING OR BLEEDING . *URINARY PROBLEMS (pain or burning when urinating, or frequent urination) . *BOWEL PROBLEMS (unusual diarrhea, constipation, pain near the anus) . TENDERNESS IN MOUTH AND THROAT WITH OR WITHOUT PRESENCE OF ULCERS (sore throat, sores in mouth, or a toothache) . UNUSUAL RASH, SWELLING OR PAIN  . UNUSUAL VAGINAL DISCHARGE OR ITCHING   Items with * indicate a potential emergency and should be followed up as soon as possible or go to the Emergency Department if any problems should occur.  Please show the CHEMOTHERAPY ALERT CARD or IMMUNOTHERAPY ALERT CARD at check-in to the Emergency Department and triage nurse.  Should  you have questions after your visit or need to cancel or reschedule your appointment, please contact [facility]  Dept: [phone]  and follow the prompts.  Office hours are 8:00 a.m. to 4:30 p.m. Monday - Friday. Please note that voicemails left after 4:00 p.m. may not be returned until the following business day.  We are closed weekends and major holidays. You have access to a nurse at all times for urgent questions. Please call the main number to the clinic Dept: [phone] and follow the prompts.   For any non-urgent questions, you may also contact your provider using MyChart. We now offer e-Visits for anyone 18 and older to request care online for non-urgent symptoms. For details visit [email or web address].   Also download the MyChart app! Go to the app store, search "MyChart", open the app, select [facility], and log in with your MyChart username and password.  Due to Covid, a mask is required upon entering the hospital/clinic. If you do not have a mask, one will be given to you upon arrival. For doctor visits, patients may have 1 support person aged 64 or older with them. For treatment visits, patients cannot have anyone with them due to current Covid guidelines and our immunocompromised population.

## 2021-03-08 NOTE — Progress Notes (Signed)
[facility] [phone]   [phone]  PROGRESS NOTE  Patient Care Team: Isaac Bliss, Rayford Halsted, MD as PCP - General (Internal Medicine) Lorretta Harp, MD as PCP - Cardiology (Cardiology)  Hematological/Oncological History # AL Amyloidosis of the Kidney 1) 08/03/2020: Free kappa 19.5, Lambda 205.7, Kappa/Lambda ratio: 0.09. M protein undetectable in serum.  2) 08/07/2020: kidney biopsy performed, results consistent with lambda light chain AL amyloidosis 3) 08/23/2020: establish care with Dr. Lorenso Courier  4) 09/01/2020: bone marrow biopsy shows plasma cell neoplasm with focal amyloid deposits 5) 09/29/2020: start of Cycle 1 Day 1 of Dara-CyBorD 6) 11/02/2020: Cycle 2 Day 1 of Dara-CyBorD 7) 12/01/2020: Cycle 3 Day 1 of Dara-CyBorD 8) 12/29/2020: Cycle 4 Day 1 of Dara-CyBorD 9) 01/05/2021: Holding Velcade due to diarrhea.  10) 01/19/2021: Adding back Velcade due to improvement of diarrhea with imodium 11) 01/26/2021: Cycle 5 Day 1 of Dara-CyBorD 12) 02/23/2021: Cycle 6 Day 1 of Dara-CyBorD  Interval History:  Samuel Butler 60 y.o. male with medical history significant for AL amyloidosis of the kidney presents for a follow up visit. The patient's last visit was on 02/23/2021. In the interim since his last visit he has continued his treatment of Dara-CyBorD. Today, he is due for Cycle 6 Day 15.   On exam today Samuel Butler reports stable energy levels. He is able to complete all his daily activities but does require resting throughout the day. He notes decreased appetite and early satiety. Patient denies any nausea, vomiting or abdominal pain. He has post prandial diarrhea that improves some with liquid imodium. He only takes imodium once a day due to bad taste it leaves in his mouth. Patient has occasional episodes of hematochezia, otherwise he is tolerating the blood thinner without any active bleeding or bruising. Patient has lower extremity edema and endorses  stiffness in the legs towards the end of the day. Edema does improve with elevation. He is undergoing physical therapy twice a week to help with strength and mobility. Patient has numbness in his feet that occasionally interfere with his balance. He denies any numbness in his hands. Patient denies any fevers, chills, night sweats, worsening shortness of breath, chest pain or cough.  He has no other complaints.  A full 10 point ROS is listed below.  MEDICAL HISTORY:  Past Medical History:  Diagnosis Date  . DDD (degenerative disc disease), lumbar    per his report  . Diabetes mellitus without complication ([place])   . Gout 04/16/2013  . Hyperlipidemia   . Hypertension   . IBD (inflammatory bowel disease) 04/05/2016  . Pulmonary embolism ([place])   . Tobacco use disorder 03/28/2015    SURGICAL HISTORY: Past Surgical History:  Procedure Laterality Date  . ABDOMINAL AORTOGRAM W/LOWER EXTREMITY Right 06/26/2020   Procedure: ABDOMINAL AORTOGRAM W/LOWER EXTREMITY;  Surgeon: Lorretta Harp, MD;  Location: [facility];  Service: Cardiovascular;  Laterality: Right;  . COLONOSCOPY    . PERIPHERAL VASCULAR INTERVENTION  06/26/2020   Procedure: PERIPHERAL VASCULAR INTERVENTION;  Surgeon: Lorretta Harp, MD;  Location: [facility];  Service: Cardiovascular;;  Right SFA  . TONSILLECTOMY      SOCIAL HISTORY: Social History   Socioeconomic History  . Marital status: Married    Spouse name: Not on file  . Number of children: Not on file  . Years of education: Not on file  . Highest education level: Not on file  Occupational History  . Not on file  Tobacco Use  . Smoking status: Former Smoker    Types: Cigarettes    Quit date: 10/23/2016    Years since quitting: 4.3  . Smokeless tobacco: Never Used  . Tobacco comment: per patient 5 cigarettes a day   Vaping Use  . Vaping Use: Never used  Substance and Sexual Activity  . Alcohol use: Not Currently    Comment: occasional  . Drug  use: No  . Sexual activity: Not on file  Other Topics Concern  . 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 Difficulty of Paying Living Expenses: Somewhat hard  Food Insecurity: No Food Insecurity  . Worried About Charity fundraiser in the Last Year: Never true  . Ran Out of Food in the Last Year: Never true  Transportation Needs: No Transportation Needs  . Lack of Transportation (Medical): No  . Lack of Transportation (Non-Medical): No  Physical Activity: Not on file  Stress: Not on file  Social Connections: Not on file  Intimate Partner Violence: Not on file    FAMILY HISTORY: Family History  Problem Relation Age of Onset  . Hypertension Father   . Diabetes Father   . Stroke Father   . Pancreatic cancer Father   . Colon cancer Brother   . Esophageal cancer Neg Hx   . Stomach cancer Neg Hx     ALLERGIES:  is allergic to crestor [rosuvastatin calcium].  MEDICATIONS:  Current Outpatient Medications  Medication Sig Dispense Refill  . loperamide (IMODIUM) 1 MG/5ML solution Take 2 mg by mouth as needed for diarrhea or loose stools.    Marland Kitchen acetaminophen (TYLENOL) 650 MG CR tablet Take 1,300 mg by mouth every 8 (eight) hours as needed for pain.     Marland Kitchen acyclovir (ZOVIRAX) 400 MG tablet Take 1 tablet (400 mg total) by mouth 2 (two) times daily. 60 tablet 3  . albuterol (VENTOLIN HFA) 108 (90 Base) MCG/ACT inhaler Inhale 2 puffs into the lungs every 6 (six) hours as needed for wheezing or shortness of breath. 8 g 0  . apixaban (ELIQUIS) 5 MG TABS tablet Take 1 tablet (5 mg total) by mouth 2 (two) times daily. 180 tablet 1  . atorvastatin (LIPITOR) 80 MG tablet Take 1 tablet (80 mg total) by mouth daily. 90 tablet 2  .  clopidogrel (PLAVIX) 75 MG tablet Take 1 tablet (75 mg total) by mouth daily with breakfast. 90 tablet 3  . dicyclomine (BENTYL) 20 MG tablet Take 1 tablet (20 mg total) by mouth 4 (four) times daily -  before meals and at bedtime. Take 20-30 minutes before meals. 120 tablet 2  . Evolocumab (REPATHA SURECLICK) 400 MG/ML SOAJ Inject 140 mg into the skin every 14 (fourteen) days. 2 mL 12  . ezetimibe (ZETIA) 10 MG tablet Take 1 tablet (10 mg total) by mouth daily. 90 tablet 3  . isosorbide mononitrate (IMDUR) 30 MG 24 hr tablet Take 1 tablet by mouth once daily 90 tablet 1  . losartan (COZAAR) 50 MG tablet Take 1 tablet (50 mg total) by mouth daily. 90 tablet 3  . metFORMIN (GLUCOPHAGE) 500 MG tablet Take 1  tablet by mouth once daily with breakfast 90 tablet 1  . ondansetron (ZOFRAN) 8 MG tablet TAKE 1 TABLET BY MOUTH EVERY 8 HOURS AS NEEDED FOR NAUSEA OR VOMITING 30 tablet 0  . pantoprazole (PROTONIX) 40 MG tablet Take 1 tablet (40 mg total) by mouth daily. 90 tablet 1  . potassium chloride SA (KLOR-CON) 20 MEQ tablet Take 2 tablets (40 mEq total) by mouth daily. 80MG x3 days then back to 40 mg/day 33 tablet 6  . torsemide (DEMADEX) 20 MG tablet Take 1 tablet (20 mg total) by mouth 2 (two) times daily. 108m BID x 3 days then back to 281mBID 33 tablet 6  . VEMLIDY 25 MG TABS Take 1 tablet (25 mg total) by mouth daily. 30 tablet 5  . Vitamin D, Ergocalciferol, (DRISDOL) 1.25 MG (50000 UNIT) CAPS capsule Take 1 capsule by mouth once a week 12 capsule 0   No current facility-administered medications for this visit.    REVIEW OF SYSTEMS:   Constitutional: ( - ) fevers, ( - )  chills , ( - ) night sweats Eyes: ( - ) blurriness of vision, ( - ) double vision, ( - ) watery eyes Ears, nose, mouth, throat, and face: ( - ) mucositis, ( - ) sore throat Respiratory: ( - ) cough, ( +) dyspnea, ( - ) wheezes Cardiovascular: ( - ) palpitation, ( - ) chest discomfort, ( +) lower extremity  swelling Gastrointestinal:  ( - ) nausea, ( - ) heartburn, ( + ) change in bowel habits Skin: ( - ) abnormal skin rashes Lymphatics: ( - ) new lymphadenopathy, ( - ) easy bruising Neurological: (+) numbness, ( - ) tingling, ( - ) new weaknesses Behavioral/Psych: ( - ) mood change, ( - ) new changes  All other systems were reviewed with the patient and are negative.  PHYSICAL EXAMINATION: ECOG PERFORMANCE STATUS: 1 - Symptomatic but completely ambulatory  Vitals:   03/08/21 0959  BP: (!) 142/97  Pulse: 83  Resp: 19  Temp: 97.8 F (36.6 C)  SpO2: 100%   Filed Weights   03/08/21 0959  Weight: 202 lb 14.4 oz (92 kg)    GENERAL: well appearing middle aged AfSerbiamerican male alert, no distress and comfortable SKIN: skin color, texture, turgor are normal, no rashes or significant lesions EYES: conjunctiva are pink and non-injected, sclera clear LUNGS: clear to auscultation and percussion with normal breathing effort HEART: regular rate & rhythm and no murmurs and +2 lower extremity edema bilaterally (L > R) Musculoskeletal: no cyanosis of digits and no clubbing  PSYCH: alert & oriented x 3, fluent speech NEURO: no focal motor/sensory deficits  LABORATORY DATA:  I have reviewed the data as listed CBC Latest Ref Rng & Units 03/08/2021 03/02/2021 02/23/2021  WBC 4.0 - 10.5 K/uL 4.3 5.8 3.9(L)  Hemoglobin 13.0 - 17.0 g/dL 11.7(L) 12.3(L) 11.4(L)  Hematocrit 39.0 - 52.0 % 35.6(L) 37.4(L) 34.0(L)  Platelets 150 - 400 K/uL 126(L) 110(L) 128(L)    CMP Latest Ref Rng & Units 03/02/2021 02/23/2021 02/16/2021  Glucose 70 - 99 mg/dL 111(H) 88 96  BUN 8 - 23 mg/dL 21 21 20   Creatinine 0.61 - 1.24 mg/dL 1.04 0.98 1.04  Sodium 135 - 145 mmol/L 146(H) 147(H) 147(H)  Potassium 3.5 - 5.1 mmol/L 3.5 3.3(L) 3.5  Chloride 98 - 111 mmol/L 107 109 108  CO2 22 - 32 mmol/L 30 28 27   Calcium 8.9 - 10.3 mg/dL 7.9(L) 7.6(L) 7.5(L)  Total Protein 6.5 -  8.1 g/dL 4.4(L) 4.2(L) 4.1(L)  Total Bilirubin 0.3  - 1.2 mg/dL 0.3 0.3 0.4  Alkaline Phos 38 - 126 U/L 82 79 75  AST 15 - 41 U/L 39 30 32  ALT 0 - 44 U/L 51(H) 34 43    Lab Results  Component Value Date   MPROTEIN 0.1 (H) 02/09/2021   MPROTEIN Not Observed 01/12/2021   MPROTEIN 0.1 (H) 12/15/2020   Lab Results  Component Value Date   KPAFRELGTCHN 10.2 02/09/2021   KPAFRELGTCHN 11.7 01/12/2021   KPAFRELGTCHN 15.9 12/15/2020   LAMBDASER 13.7 02/09/2021   LAMBDASER 16.9 01/12/2021   LAMBDASER 21.7 12/15/2020   KAPLAMBRATIO 1.51 (L) 02/26/2021   KAPLAMBRATIO 0.74 02/09/2021   KAPLAMBRATIO 0.69 01/12/2021     RADIOGRAPHIC STUDIES: US Abdomen Limited RUQ (LIVER/GB)  Result Date: 03/01/2021 CLINICAL DATA:  Chronic viral hepatitis B. EXAM: ULTRASOUND ABDOMEN LIMITED RIGHT UPPER QUADRANT COMPARISON:  August 29, 2020 FINDINGS: Gallbladder: No gallstones or wall thickening visualized (0.9 mm). No sonographic Murphy sign noted by sonographer. Common bile duct: Diameter: 4.8 mm Liver: Multiple small shadowing echogenic foci are seen within the liver parenchyma (the largest measures approximately 1.6 cm). There is diffusely increased echogenicity of the liver parenchyma which is nodular in contour. Portal vein is patent on color Doppler imaging with normal direction of blood flow towards the liver. Other: None. IMPRESSION: 1. Fatty, cirrhotic liver with multiple parenchymal calcifications. These may represent multiple calcified granulomas. Electronically Signed   By: Virgina Norfolk M.D.   On: 03/01/2021 20:10    ASSESSMENT & PLAN [place] A Herren 60 y.o. male with medical history significant for AL amyloidosis of the kidney presents for a follow up visit.  After review the labs, the records, discussion with the patient the findings are most consistent with AL amyloidosis affecting the kidney.  As such I would recommended we start treatment with Dara CyBorD.  After reviewing the biopsy results his findings are most consistent with AL  amyloidosis. As such the treatment of choice would be to target his plasma cell population with a triplet or quadruplet therapy. Therapy of choice in this case would consist of daratumumab, Velcade, cyclophosphamide, and dexamethasone. Given the patient's good functional status we will start with full dose Dara-CyBorD. I previously discussed the side effects of this chemotherapy with the patient including neuropathy, elevated blood pressure, drop in blood counts, hypersensitivity reaction, chest tightness, increased infection risk, and fatigue. The patient and family voiced their understanding of these findings and are agreeable to moving forward with quadruple therapy with the goal of being a bridge to bone marrow transplant.   The regimen of choice is daratumumab, bortezomib, cyclophosphamide and dexamethasone per the ANDROMEDA Study ( Blood. 2020 Jul 2;136(1):71-80). Treatment consists of: Cyclophosphamide 300 mg/m2 intravenously and bortezomib 1.3 mg/m2 subcutaneously were given on days 1, 8, 15, and 22 of each 28 day cycle for up to 6 cycles. Dexamethasone 40 mg (starting dose) was given orally or intravenously weekly for each cycle for up to 6 cycles. DARA [place] was administered in a single, premixed vial and given by manual [place]ley injection over the course of 3 to 5 minutes weekly in cycles 1 to 2, every 2 weeks in cycles 3 to 6, and every 4 weeks thereafter as monotherapy for a maximum of 2 years. We would consider this continued dosing regimen if patient declined or was determined not to be a candidate for BMT.   On exam today,  Mr. Farrington is currently at his baseline.  He has lost approximately 10 lbs in the last two weeks secondary to diarrhea. He is scheduled for a colonoscopy on 03/28/2021.  Labs from today were reviewed without any intervention needed.  Patient will proceed with treatment as planned.  Patient will return to the clinic in 2 weeks as scheduled.  #AL Amyloidosis Affecting the  Kidney --bone marrow biopsy and kidney biopsy helped to confirm the diagnosis of AL amyloidosis. --currently being treated with  Dara-CyBorD (per the [facility] Trial), started Cycle 1 Day 1 on 09/29/2020.  --today is Cycle 6 Day 15 of treatment. Starting Cycle 7 the patient will only require monthly injections.  --added back Velcade due to improvement of diarrhea on imodium.  --will monitoring monthly SPEP, UPEP, and SFLC while on treatment. --will have patient return for weekly treatment with q 2 week clinic visits with interval weekly treatment  #Diarrhea with abdominal cramping --Improved with Imodium and Bentyl, but still has some persistent symptoms.  --Due to poor taste with liquid Imodium, recommended to switch to tablets. Advised to be more aggressive with Imodium since patient only takes it once a day.  --GI scheduled EGD/Colonoscopy on 03/28/2021   #Weight Loss, improving --Diarrhea combined with diuresis is likely causing his weight loss --weight decreased to 202, down 10 lbs from 02/20/2021.  --Under the care of dietician and drinking protein shakes --continue to monitor  #Injection Site Rash, improved --appears benign on exam today  --completed antibiotic therapy  #Supportive Care --chemotherapy education performed --zofran 4m q8H PRN and compazine 125mPO q6H for nausea --acyclovir 40045mO BID for VCZ prophylaxis --albuterol HFA inhaler for daratumumab treatment -- EMLA cream for port not required, patient declines port at this time, but will reconsider if patient desires.  -- no pain medication required at this time.   No orders of the defined types were placed in this encounter.   All questions were answered. The patient knows to call the clinic with any problems, questions or concerns.  A total of more than 30 minutes were spent on this encounter and over half of that time was spent on counseling and coordination of care as outlined above.   IreDede QueryA-C Department of Hematology/Oncology [facility]: [phone]/28/2022 10:26 AM   Literature Support:  V, Wendie Chess, Weiss BM, VerFredderick Severanceomenzo RL. Daratumumab plus CyBorD for patients with newly diagnosed AL amyloidosis: safety run-in results of ANDROMEDA. Blood. 2020 Jul 2;136(1):71-80.  --Daratumumab-CyBorD was well tolerated, with no new safety concerns versus the intravenous formulation, and demonstrated robust hematologic and organ responses.

## 2021-03-12 ENCOUNTER — Other Ambulatory Visit: Payer: Self-pay | Admitting: Internal Medicine

## 2021-03-12 ENCOUNTER — Ambulatory Visit: Payer: BC Managed Care – PPO | Attending: Internal Medicine

## 2021-03-12 ENCOUNTER — Other Ambulatory Visit: Payer: Self-pay

## 2021-03-12 DIAGNOSIS — M6281 Muscle weakness (generalized): Secondary | ICD-10-CM | POA: Insufficient documentation

## 2021-03-12 DIAGNOSIS — R5381 Other malaise: Secondary | ICD-10-CM | POA: Insufficient documentation

## 2021-03-12 DIAGNOSIS — E119 Type 2 diabetes mellitus without complications: Secondary | ICD-10-CM

## 2021-03-12 DIAGNOSIS — R262 Difficulty in walking, not elsewhere classified: Secondary | ICD-10-CM | POA: Insufficient documentation

## 2021-03-12 NOTE — Therapy (Signed)
[facility] [address] [place], [place], [address] Phone: [phone]   Fax:  [phone]  Physical Therapy Treatment  Patient Details  Name: Samuel Butler MRN: [id number] Date of Birth: [birth year] Referring Provider (PT): Isaac Bliss, Rayford Halsted, MD   Encounter Date: 03/12/2021   PT End of Session - 03/12/21 1026    Visit Number 9    Date for PT Re-Evaluation 03/20/21    Authorization Type BCBS    PT Start Time 0958    PT Stop Time 1033    PT Time Calculation (min) 35 min    Activity Tolerance Patient limited by fatigue;Patient tolerated treatment well    Behavior During Therapy [facility] for tasks assessed/performed           Past Medical History:  Diagnosis Date  . DDD (degenerative disc disease), lumbar    per his report  . Diabetes mellitus without complication ([place])   . Gout 04/16/2013  . Hyperlipidemia   . Hypertension   . IBD (inflammatory bowel disease) 04/05/2016  . Pulmonary embolism ([place])   . Tobacco use disorder 03/28/2015    Past Surgical History:  Procedure Laterality Date  . ABDOMINAL AORTOGRAM W/LOWER EXTREMITY Right 06/26/2020   Procedure: ABDOMINAL AORTOGRAM W/LOWER EXTREMITY;  Surgeon: Lorretta Harp, MD;  Location: [place] [facility];  Service: Cardiovascular;  Laterality: Right;  . COLONOSCOPY    . PERIPHERAL VASCULAR INTERVENTION  06/26/2020   Procedure: PERIPHERAL VASCULAR INTERVENTION;  Surgeon: Lorretta Harp, MD;  Location: [place] [facility];  Service: Cardiovascular;;  Right SFA  . TONSILLECTOMY      There were no vitals filed for this visit.   Subjective Assessment - 03/12/21 1000    Subjective I have been walking and doing yard work.  I am able to walk a mile.  I can mow the entire front yard without taking a rest and I could not do that before.    Pertinent History receiving chemo weekly for AL amyloidosis cancer; has had stents in LE due to blockages    Currently in  Pain? No/denies                             [facility] Adult PT Treatment/Exercise - 03/12/21 0001      Transfers   Five time sit to stand comments  20.59- improved descent, slower than last session      Knee/Hip Exercises: Aerobic   Nustep L2 x 8 minutes- PT present to monitor    Other Aerobic Arm bike: Level 1 x 4 minutes (2/2)      Knee/Hip Exercises: Machines for Strengthening   Cybex Leg Press 65# seat 8  2x10, Rt LE 30# Rt and Lt 2x10   reduced weight today due to fatigue     Knee/Hip Exercises: Standing   Forward Step Up Both;Hand Hold: 1;Step Height: 6";5 reps   LE fatigue today     Knee/Hip Exercises: Seated   Ball Squeeze 2x10    Sit to Sand 2 sets;5 reps      Shoulder Exercises: Seated   Flexion Weight (lbs) 2    Flexion Limitations 3 way raises with 2# 2x10    Other Seated Exercises biceps curls 4# 2x10                    PT Short Term Goals - 02/08/21 1248      PT SHORT  TERM GOAL #1   Title 5 x sit to stand in 20 seconds or less    Baseline 14.09    Status Achieved      PT SHORT TERM GOAL #2   Title Pt will be able to go up and down 4 steps reciprocally 3 x using one handrail due to improved strength    Baseline ascending with 1 rail.  Descdending pt demonstrated instability and required both rails    Status On-going             PT Long Term Goals - 01/23/21 1208      PT LONG TERM GOAL #2   Title Be able to get up and down off the floor without assistance    Baseline min assist and UE support    Status On-going      PT LONG TERM GOAL #4   Title Pt will be able to perform single leg stand for at least10 seconds for reduced risk of falls    Status Achieved                 Plan - 03/12/21 1023    Clinical Impression Statement Pt has had a lapse in treatment due and reports that he has been doing yardwork and walking for endurance gains.  Pt is now able to mow the front yard without need to rest indicating improved  endurance.  PT monitored pt closely during exercise for fatigue and made modifications to exercises as indicated.   PT alternated between upper and lower body exercises today to allow for maximized treatment with less fatigue.  5x sit to stand is 20.59 seconds, increased time from last session.  Pt demonstrated improved control with descent and performed without UE support.  Pt will continue to benefit from skilled PT for further LE strengthening, endurance training and high-level balance work in order to meet his LTGs and improve function.    PT Frequency 2x / week    PT Duration 12 weeks    PT Treatment/Interventions ADLs/Self Care Home Management;Cryotherapy;Electrical Stimulation;Moist Heat;Traction;Gait Scientist, forensic;Therapeutic activities;Therapeutic exercise;Balance training;Neuromuscular re-education;Patient/family education;Manual techniques;Taping    PT Next Visit Plan balance, strength and balance progression as pt tolerates. 6 minute walk test next- do at the begining of the session.    PT Home Exercise Plan Access Code: [email or web address]    Consulted and Agree with Plan of Care Patient           Patient will benefit from skilled therapeutic intervention in order to improve the following deficits and impairments:  Pain,Postural dysfunction,Decreased strength,Difficulty walking,Decreased balance,Abnormal gait,Decreased endurance  Visit Diagnosis: Muscle weakness (generalized)  Difficulty in walking, not elsewhere classified  Physical deconditioning     Problem List Patient Active Problem List   Diagnosis Date Noted  . Acute pulmonary embolism ([place]) 10/18/2020  . Groin pain, right 08/31/2020  . Chronic viral hepatitis B without delta-agent ([place]) 08/21/2020  . Amyloidosis ([place]) 08/21/2020  . Anasarca 08/04/2020  . Nephrotic syndrome 08/03/2020  . AKI (acute kidney injury) ([place]) 08/03/2020  . Hypokalemia 06/27/2020  . Claudication in peripheral vascular disease ([place])  06/26/2020  . Hypertension associated with diabetes ([place]) 05/25/2020  . Peripheral arterial disease ([place]) 03/08/2020  . Vitamin D deficiency 10/26/2019  . Hypoalbuminemia 10/26/2019  . IBD (inflammatory bowel disease) 04/05/2016  . Tobacco use disorder 03/28/2015  . Non-insulin treated type 2 diabetes mellitus ([place]) 01/25/2014  . Essential hypertension, benign 04/16/2013  . Hyperlipemia 04/16/2013  . Gout 04/16/2013  Sigurd Sos, PT 03/12/21 10:31 AM  [facility] [address] [place], [place], [address] Phone: [phone]   Fax:  [phone]  Name: Samuel Butler MRN: [id number] Date of Birth: 03/21/1961

## 2021-03-13 ENCOUNTER — Ambulatory Visit (INDEPENDENT_AMBULATORY_CARE_PROVIDER_SITE_OTHER): Payer: BC Managed Care – PPO | Admitting: Cardiovascular Disease

## 2021-03-13 ENCOUNTER — Encounter: Payer: Self-pay | Admitting: Cardiovascular Disease

## 2021-03-13 DIAGNOSIS — I739 Peripheral vascular disease, unspecified: Secondary | ICD-10-CM | POA: Diagnosis not present

## 2021-03-13 DIAGNOSIS — I2510 Atherosclerotic heart disease of native coronary artery without angina pectoris: Secondary | ICD-10-CM | POA: Insufficient documentation

## 2021-03-13 DIAGNOSIS — I25119 Atherosclerotic heart disease of native coronary artery with unspecified angina pectoris: Secondary | ICD-10-CM

## 2021-03-13 MED ORDER — CILOSTAZOL 50 MG PO TABS: 50.0000 mg | ORAL_TABLET | Freq: Two times a day (BID) | ORAL | 3 refills | Status: DC

## 2021-03-13 NOTE — Assessment & Plan Note (Signed)
Coronary CTA performed 10/18/2020 showed a mid RCA CTO without disease in the left system.  He complains of occasional atypical chest pain which does not sound anginal.

## 2021-03-13 NOTE — Assessment & Plan Note (Signed)
History of peripheral arterial disease with bilateral SFA popliteal and tibial vessel disease.  He did did have a narrow aortic bifurcation.  I performed peripheral angiography on him 06/26/2020 and stented his proximal and mid right SFA via antegrade approach.  He did have 99% calcified popliteal and tibioperoneal trunk on the right as well as a 90% distal left SFA and 99% calcified left popliteal artery stenosis with one-vessel runoff via peroneal artery.  Follow-up Doppler studies performed 07/18/2020 revealed right ABI 0.93 and a left of 1.03.  His velocities in his SFA markedly improved as did his claudication.  He still complains of calf claudication however.  I am going to begin him on Pletal 50 mg p.o. twice daily to assess efficacy with regard to his claudication symptoms.

## 2021-03-13 NOTE — Progress Notes (Signed)
03/13/2021 Samuel Butler   06-02-1961  [id number]  Primary Physician Isaac Bliss, Rayford Halsted, MD Primary Cardiologist: Lorretta Harp MD [name], [place]  HPI:  Samuel Butler is a 60 y.o.  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 He was referred by Dr. Debara Pickett for peripheral vascular valuation because of lifestyle limiting claudication.I last saw him in the office  09/26/2020.History factors include just continued tobacco use for years ago when he was smoking 2 packs a week for the last 40 years, treated hypertension and hyperlipidemia. Is not diabetic. His mother did die of a myocardial infarction in her 71s. He was referred to Dr. Debara Pickett because of peripheral edema and hyper lipidemia. A work-up is in progress. He was prescribed a statin which she could not tolerate. He has had lifestyle limiting claudication for last 6 months. Dopplers performed 02/28/2020 revealed a right ABI of 0.76 and a left of 0.87 with a high-frequency signal in his mid right SFA and left tibial vessel disease.  Because of his progressive claudication I performed peripheral angiography on him 06/26/2020 revealing high-grade proximal and mid right SFA disease as well as high-grade calcified right popliteal disease with 0 vessel runoff.  He had a narrow aortic bifurcation and therefore I performed right common femoral antegrade puncture and stenting of both proximal mid right SFA stenoses resulting in markedly improved claudication and ABIs.  He was complaining some right groin pain.  Doppler ruled out AV fistula or pseudoaneurysm and he was ultimately found to have a right inguinal hernia.  He has also been diagnosed with amyloid disease with nephrotic syndrome probably contributing to his lower extremity edema.  He also was complaining of occasional chest pain several times a week and a coronary CTA has been ordered.  This was  performed 10/18/2020 which showed a mid RCA CTO without significant disease in the left system.  Since I saw him 6 months ago he has been getting treated for his amyloidosis and nephrotic syndrome.  His follow-up lower extremity arterial Doppler studies performed 07/18/2020 did show improvement on the right side status post stenting in the proximal and distal right SFA although he does still complain of claudication.  He gets occasional noncardiac atypical chest pain.  He stopped smoking 45 years ago.   Current Meds  Medication Sig  . acetaminophen (TYLENOL) 650 MG CR tablet Take 1,300 mg by mouth every 8 (eight) hours as needed for pain.   Marland Kitchen acyclovir (ZOVIRAX) 400 MG tablet Take 1 tablet (400 mg total) by mouth 2 (two) times daily.  Marland Kitchen albuterol (VENTOLIN HFA) 108 (90 Base) MCG/ACT inhaler Inhale 2 puffs into the lungs every 6 (six) hours as needed for wheezing or shortness of breath.  Marland Kitchen apixaban (ELIQUIS) 5 MG TABS tablet Take 1 tablet (5 mg total) by mouth 2 (two) times daily.  Marland Kitchen atorvastatin (LIPITOR) 80 MG tablet Take 1 tablet by mouth once daily  . cilostazol (PLETAL) 50 MG tablet Take 1 tablet (50 mg total) by mouth 2 (two) times daily.  . clopidogrel (PLAVIX) 75 MG tablet Take 1 tablet (75 mg total) by mouth daily with breakfast.  . dicyclomine (BENTYL) 20 MG tablet Take 1 tablet (20 mg total) by mouth 4 (four) times daily -  before meals and at bedtime. Take 20-30 minutes before meals.  . Evolocumab (REPATHA SURECLICK) 132 MG/ML SOAJ Inject 140 mg into the skin every 14 (fourteen)  days.  . ezetimibe (ZETIA) 10 MG tablet Take 1 tablet (10 mg total) by mouth daily.  . isosorbide mononitrate (IMDUR) 30 MG 24 hr tablet Take 1 tablet by mouth once daily  . loperamide (IMODIUM) 1 MG/5ML solution Take 2 mg by mouth as needed for diarrhea or loose stools.  Marland Kitchen losartan (COZAAR) 50 MG tablet Take 1 tablet (50 mg total) by mouth daily.  . metFORMIN (GLUCOPHAGE) 500 MG tablet Take 1 tablet by mouth once  daily with breakfast  . ondansetron (ZOFRAN) 8 MG tablet TAKE 1 TABLET BY MOUTH EVERY 8 HOURS AS NEEDED FOR NAUSEA OR VOMITING  . pantoprazole (PROTONIX) 40 MG tablet Take 1 tablet (40 mg total) by mouth daily.  . potassium chloride SA (KLOR-CON) 20 MEQ tablet Take 2 tablets (40 mEq total) by mouth daily. 80MG  x3 days then back to 40 mg/day  . torsemide (DEMADEX) 20 MG tablet Take 1 tablet (20 mg total) by mouth 2 (two) times daily. 40mg  BID x 3 days then back to 20mg  BID  . VEMLIDY 25 MG TABS Take 1 tablet (25 mg total) by mouth daily.  . Vitamin D, Ergocalciferol, (DRISDOL) 1.25 MG (50000 UNIT) CAPS capsule Take 1 capsule by mouth once a week     Allergies  Allergen Reactions  . Crestor [Rosuvastatin Calcium] Other (See Comments)    Joint aches    Social History   Socioeconomic History  . Marital status: Married    Spouse name: Not on file  . Number of children: Not on file  . Years of education: Not on file  . Highest education level: Not on file  Occupational History  . Not on file  Tobacco Use  . Smoking status: Former Smoker    Types: Cigarettes    Quit date: 10/23/2016    Years since quitting: 4.3  . Smokeless tobacco: Never Used  . Tobacco comment: per patient 5 cigarettes a day   Vaping Use  . Vaping Use: Never used  Substance and Sexual Activity  . Alcohol use: Not Currently    Comment: occasional  . Drug use: No  . Sexual activity: Not on file  Other Topics Concern  . 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 Difficulty of Paying Living Expenses: Somewhat hard  Food Insecurity: No Food Insecurity  . Worried About Charity fundraiser in the Last Year: Never true  . Ran Out of Food in the Last Year:  Never true  Transportation Needs: No Transportation Needs  . Lack of Transportation (Medical): No  . Lack of Transportation (Non-Medical): No  Physical Activity: Not on file  Stress: Not on file  Social Connections: Not on file  Intimate Partner Violence: Not on file     Review of Systems: General: negative for chills, fever, night sweats or weight changes.  Cardiovascular: negative for chest pain, dyspnea on exertion, edema, orthopnea, palpitations, paroxysmal nocturnal dyspnea or shortness of breath Dermatological: negative for rash Respiratory: negative for cough or wheezing Urologic: negative for hematuria Abdominal: negative for nausea, vomiting, diarrhea, bright red blood per rectum, melena, or hematemesis Neurologic: negative for visual changes, syncope, or  dizziness All other systems reviewed and are otherwise negative except as noted above.    Blood pressure 124/82, pulse 90, height 5' 11.5" (1.816 m), weight 204 lb 12.8 oz (92.9 kg), SpO2 99 %.  General appearance: alert and no distress Neck: no adenopathy, no carotid bruit, no JVD, supple, symmetrical, trachea midline and thyroid not enlarged, symmetric, no tenderness/mass/nodules Lungs: clear to auscultation bilaterally Heart: regular rate and rhythm, S1, S2 normal, no murmur, click, rub or gallop Extremities: extremities normal, atraumatic, no cyanosis or edema Pulses: Diminished pedal pulses bilaterally Skin: Skin color, texture, turgor normal. No rashes or lesions Neurologic: Alert and oriented X 3, normal strength and tone. Normal symmetric reflexes. Normal coordination and gait  EKG not performed today  ASSESSMENT AND PLAN:   Claudication in peripheral vascular disease ([place]) History of peripheral arterial disease with bilateral SFA popliteal and tibial vessel disease.  He did did have a narrow aortic bifurcation.  I performed peripheral angiography on him 06/26/2020 and stented his proximal and mid right SFA via  antegrade approach.  He did have 99% calcified popliteal and tibioperoneal trunk on the right as well as a 90% distal left SFA and 99% calcified left popliteal artery stenosis with one-vessel runoff via peroneal artery.  Follow-up Doppler studies performed 07/18/2020 revealed right ABI 0.93 and a left of 1.03.  His velocities in his SFA markedly improved as did his claudication.  He still complains of calf claudication however.  I am going to begin him on Pletal 50 mg p.o. twice daily to assess efficacy with regard to his claudication symptoms.  Coronary artery disease Coronary CTA performed 10/18/2020 showed a mid RCA CTO without disease in the left system.  He complains of occasional atypical chest pain which does not sound anginal.      Lorretta Harp MD [facility], [facility] 03/13/2021 1:39 PM

## 2021-03-13 NOTE — Patient Instructions (Signed)
Medication Instructions:  START: PLETAL 50mg  TWICE DAILY  *If you need a refill on your cardiac medications before your next appointment, please call your pharmacy*  Testing/Procedures: KEEP LOWER EXTREMITY DUPLEX STUDIES AS ORDERED   Follow-Up: At [facility], you and your health needs are our priority.  As part of our continuing mission to provide you with exceptional heart care, we have created designated Provider Care Teams.  These Care Teams include your primary Cardiologist (physician) and Advanced Practice Providers (APPs -  Physician Assistants and Nurse Practitioners) who all work together to provide you with the care you need, when you need it.  We recommend signing up for the patient portal called "MyChart".  Sign up information is provided on this After Visit Summary.  MyChart is used to connect with patients for Virtual Visits (Telemedicine).  Patients are able to view lab/test results, encounter notes, upcoming appointments, etc.  Non-urgent messages can be sent to your provider as well.   To learn more about what you can do with MyChart, go to [email or web address].    Your next appointment:   3 month(s)  The format for your next appointment:   In Person  Provider:   Quay Burow, MD

## 2021-03-14 ENCOUNTER — Other Ambulatory Visit: Payer: Self-pay

## 2021-03-14 ENCOUNTER — Ambulatory Visit: Payer: BC Managed Care – PPO

## 2021-03-14 DIAGNOSIS — R5381 Other malaise: Secondary | ICD-10-CM

## 2021-03-14 DIAGNOSIS — R262 Difficulty in walking, not elsewhere classified: Secondary | ICD-10-CM

## 2021-03-14 DIAGNOSIS — M6281 Muscle weakness (generalized): Secondary | ICD-10-CM

## 2021-03-14 NOTE — Therapy (Signed)
[facility] [address], [place], [place], [address] Phone: [phone]   Fax:  [phone]  Physical Therapy Treatment  Patient Details  Name: Samuel Butler MRN: [id number] Date of Birth: [birth year] Referring Provider (PT): Isaac Bliss, Rayford Halsted, MD   Encounter Date: 03/14/2021   PT End of Session - 03/14/21 1221    Visit Number 10    Date for PT Re-Evaluation 03/20/21    Authorization Type BCBS    PT Start Time 1149    PT Stop Time 1228    PT Time Calculation (min) 39 min    Activity Tolerance Patient tolerated treatment well    Behavior During Therapy [facility] for tasks assessed/performed           Past Medical History:  Diagnosis Date  . DDD (degenerative disc disease), lumbar    per his report  . Diabetes mellitus without complication ([place])   . Gout 04/16/2013  . Hyperlipidemia   . Hypertension   . IBD (inflammatory bowel disease) 04/05/2016  . Pulmonary embolism ([place])   . Tobacco use disorder 03/28/2015    Past Surgical History:  Procedure Laterality Date  . ABDOMINAL AORTOGRAM W/LOWER EXTREMITY Right 06/26/2020   Procedure: ABDOMINAL AORTOGRAM W/LOWER EXTREMITY;  Surgeon: Lorretta Harp, MD;  Location: [place] [facility];  Service: Cardiovascular;  Laterality: Right;  . COLONOSCOPY    . PERIPHERAL VASCULAR INTERVENTION  06/26/2020   Procedure: PERIPHERAL VASCULAR INTERVENTION;  Surgeon: Lorretta Harp, MD;  Location: [place] [facility];  Service: Cardiovascular;;  Right SFA  . TONSILLECTOMY      There were no vitals filed for this visit.   Subjective Assessment - 03/14/21 1150    Subjective I felt good after last visit.    Patient Stated Goals be able to get up and down large steps and off the floor; be less short of breath    Currently in Pain? No/denies                             [facility] Adult PT Treatment/Exercise - 03/14/21 0001      Knee/Hip Exercises: Aerobic    Nustep L2 x 8 minutes- PT present to monitor    Other Aerobic Arm bike: Level 1 x 4 minutes (2/2)      Knee/Hip Exercises: Machines for Strengthening   Cybex Leg Press 65# seat 8  2x10, Rt LE 30# Rt and Lt 2x10   reduced weight today due to fatigue     Knee/Hip Exercises: Standing   Forward Step Up Both;Hand Hold: 1;Step Height: 6";5 reps    Other Standing Knee Exercises walking in reverse: 20# x10      Knee/Hip Exercises: Seated   Ball Squeeze 2x10    Sit to Sand 2 sets;10 reps   seated on black pad     Shoulder Exercises: Seated   Horizontal ABduction Strengthening;20 reps;Theraband    Theraband Level (Shoulder Horizontal ABduction) Level 3 (Green)    Flexion Weight (lbs) 2    Flexion Limitations 3 way raises with 2# 2x10    Other Seated Exercises biceps curls 4# 2x10                    PT Short Term Goals - 02/08/21 1248      PT SHORT TERM GOAL #1   Title 5 x sit to stand in 20 seconds or less  Baseline 14.09    Status Achieved      PT SHORT TERM GOAL #2   Title Pt will be able to go up and down 4 steps reciprocally 3 x using one handrail due to improved strength    Baseline ascending with 1 rail.  Descdending pt demonstrated instability and required both rails    Status On-going             PT Long Term Goals - 01/23/21 1208      PT LONG TERM GOAL #2   Title Be able to get up and down off the floor without assistance    Baseline min assist and UE support    Status On-going      PT LONG TERM GOAL #4   Title Pt will be able to perform single leg stand for at least10 seconds for reduced risk of falls    Status Achieved                 Plan - 03/14/21 1221    Clinical Impression Statement Pt tolerated last session very well and didn't experience significant fatigue after. Pt is now able to mow the front yard without need to rest indicating improved endurance.  PT monitored pt closely during exercise for fatigue and made modifications to  exercises as indicated.   PT alternated between upper and lower body exercises today to allow for maximized treatment with less fatigue.  Pt demonstrates LE weakness/fatigue with sit to stand and requires UE support and pad in the seat to elevate the surface.  Pt will continue to benefit from skilled PT for further LE strengthening, endurance training and high-level balance work in order to meet his LTGs and improve function.    Rehab Potential Good    PT Frequency 2x / week    PT Duration 12 weeks    PT Treatment/Interventions ADLs/Self Care Home Management;Cryotherapy;Electrical Stimulation;Moist Heat;Traction;Gait Scientist, forensic;Therapeutic activities;Therapeutic exercise;Balance training;Neuromuscular re-education;Patient/family education;Manual techniques;Taping    PT Next Visit Plan balance, strength and balance progression as pt tolerates. 6 minute walk test next- do at the begining of the session-didn't perform today due to crowd in gym.    PT Home Exercise Plan Access Code: [email or web address]    Consulted and Agree with Plan of Care Patient           Patient will benefit from skilled therapeutic intervention in order to improve the following deficits and impairments:  Pain,Postural dysfunction,Decreased strength,Difficulty walking,Decreased balance,Abnormal gait,Decreased endurance  Visit Diagnosis: Muscle weakness (generalized)  Difficulty in walking, not elsewhere classified  Physical deconditioning     Problem List Patient Active Problem List   Diagnosis Date Noted  . Coronary artery disease 03/13/2021  . Acute pulmonary embolism ([place]) 10/18/2020  . Groin pain, right 08/31/2020  . Chronic viral hepatitis B without delta-agent ([place]) 08/21/2020  . Amyloidosis ([place]) 08/21/2020  . Anasarca 08/04/2020  . Nephrotic syndrome 08/03/2020  . AKI (acute kidney injury) ([place]) 08/03/2020  . Hypokalemia 06/27/2020  . Claudication in peripheral vascular disease ([place]) 06/26/2020  .  Hypertension associated with diabetes ([place]) 05/25/2020  . Peripheral arterial disease ([place]) 03/08/2020  . Vitamin D deficiency 10/26/2019  . Hypoalbuminemia 10/26/2019  . IBD (inflammatory bowel disease) 04/05/2016  . Tobacco use disorder 03/28/2015  . Non-insulin treated type 2 diabetes mellitus ([place]) 01/25/2014  . Essential hypertension, benign 04/16/2013  . Hyperlipemia 04/16/2013  . Gout 04/16/2013     Sigurd Sos, PT 03/14/21 12:23 PM  [facility]  [place] [address] [place], [place], [address] Phone: [phone]   Fax:  [phone]  Name: Samuel Butler MRN: [id number] Date of Birth: 1961/01/13

## 2021-03-16 ENCOUNTER — Other Ambulatory Visit: Payer: Self-pay

## 2021-03-16 ENCOUNTER — Inpatient Hospital Stay: Payer: BC Managed Care – PPO | Attending: Hematology and Oncology

## 2021-03-16 ENCOUNTER — Inpatient Hospital Stay: Payer: BC Managed Care – PPO | Admitting: Dietician

## 2021-03-16 ENCOUNTER — Inpatient Hospital Stay: Payer: BC Managed Care – PPO

## 2021-03-16 VITALS — BP 124/85 | HR 99 | Temp 98.4°F | Resp 18 | Wt 205.5 lb

## 2021-03-16 DIAGNOSIS — Z87891 Personal history of nicotine dependence: Secondary | ICD-10-CM | POA: Insufficient documentation

## 2021-03-16 DIAGNOSIS — R200 Anesthesia of skin: Secondary | ICD-10-CM | POA: Diagnosis not present

## 2021-03-16 DIAGNOSIS — R21 Rash and other nonspecific skin eruption: Secondary | ICD-10-CM | POA: Insufficient documentation

## 2021-03-16 DIAGNOSIS — R194 Change in bowel habit: Secondary | ICD-10-CM | POA: Diagnosis not present

## 2021-03-16 DIAGNOSIS — Z833 Family history of diabetes mellitus: Secondary | ICD-10-CM | POA: Diagnosis not present

## 2021-03-16 DIAGNOSIS — M7989 Other specified soft tissue disorders: Secondary | ICD-10-CM | POA: Diagnosis not present

## 2021-03-16 DIAGNOSIS — R634 Abnormal weight loss: Secondary | ICD-10-CM | POA: Diagnosis not present

## 2021-03-16 DIAGNOSIS — Z800 Family history of malignant neoplasm of digestive organs: Secondary | ICD-10-CM | POA: Insufficient documentation

## 2021-03-16 DIAGNOSIS — M25532 Pain in left wrist: Secondary | ICD-10-CM | POA: Diagnosis not present

## 2021-03-16 DIAGNOSIS — E8581 Light chain (AL) amyloidosis: Secondary | ICD-10-CM

## 2021-03-16 DIAGNOSIS — Z8249 Family history of ischemic heart disease and other diseases of the circulatory system: Secondary | ICD-10-CM | POA: Insufficient documentation

## 2021-03-16 DIAGNOSIS — E785 Hyperlipidemia, unspecified: Secondary | ICD-10-CM | POA: Insufficient documentation

## 2021-03-16 DIAGNOSIS — B181 Chronic viral hepatitis B without delta-agent: Secondary | ICD-10-CM | POA: Diagnosis not present

## 2021-03-16 DIAGNOSIS — Z823 Family history of stroke: Secondary | ICD-10-CM | POA: Diagnosis not present

## 2021-03-16 DIAGNOSIS — Z5112 Encounter for antineoplastic immunotherapy: Secondary | ICD-10-CM | POA: Insufficient documentation

## 2021-03-16 DIAGNOSIS — Z7901 Long term (current) use of anticoagulants: Secondary | ICD-10-CM | POA: Insufficient documentation

## 2021-03-16 DIAGNOSIS — M79606 Pain in leg, unspecified: Secondary | ICD-10-CM | POA: Diagnosis not present

## 2021-03-16 DIAGNOSIS — Z79899 Other long term (current) drug therapy: Secondary | ICD-10-CM | POA: Insufficient documentation

## 2021-03-16 DIAGNOSIS — K760 Fatty (change of) liver, not elsewhere classified: Secondary | ICD-10-CM | POA: Insufficient documentation

## 2021-03-16 DIAGNOSIS — Z5111 Encounter for antineoplastic chemotherapy: Secondary | ICD-10-CM | POA: Diagnosis present

## 2021-03-16 LAB — CBC WITH DIFFERENTIAL (CANCER CENTER ONLY)
Abs Immature Granulocytes: 0.01 10*3/uL (ref 0.00–0.07)
Basophils Absolute: 0 10*3/uL (ref 0.0–0.1)
Basophils Relative: 0 %
Eosinophils Absolute: 0.1 10*3/uL (ref 0.0–0.5)
Eosinophils Relative: 4 %
HCT: 32 % — ABNORMAL LOW (ref 39.0–52.0)
Hemoglobin: 10.9 g/dL — ABNORMAL LOW (ref 13.0–17.0)
Immature Granulocytes: 0 %
Lymphocytes Relative: 16 %
Lymphs Abs: 0.5 10*3/uL — ABNORMAL LOW (ref 0.7–4.0)
MCH: 33.6 pg (ref 26.0–34.0)
MCHC: 34.1 g/dL (ref 30.0–36.0)
MCV: 98.8 fL (ref 80.0–100.0)
Monocytes Absolute: 0.3 10*3/uL (ref 0.1–1.0)
Monocytes Relative: 10 %
Neutro Abs: 2.3 10*3/uL (ref 1.7–7.7)
Neutrophils Relative %: 70 %
Platelet Count: 125 10*3/uL — ABNORMAL LOW (ref 150–400)
RBC: 3.24 MIL/uL — ABNORMAL LOW (ref 4.22–5.81)
RDW: 15.2 % (ref 11.5–15.5)
WBC Count: 3.2 10*3/uL — ABNORMAL LOW (ref 4.0–10.5)
nRBC: 0 % (ref 0.0–0.2)

## 2021-03-16 LAB — CMP (CANCER CENTER ONLY)
ALT: 19 U/L (ref 0–44)
AST: 20 U/L (ref 15–41)
Albumin: 1.5 g/dL — ABNORMAL LOW (ref 3.5–5.0)
Alkaline Phosphatase: 76 U/L (ref 38–126)
Anion gap: 8 (ref 5–15)
BUN: 20 mg/dL (ref 8–23)
CO2: 30 mmol/L (ref 22–32)
Calcium: 7.6 mg/dL — ABNORMAL LOW (ref 8.9–10.3)
Chloride: 108 mmol/L (ref 98–111)
Creatinine: 1.06 mg/dL (ref 0.61–1.24)
GFR, Estimated: >60 mL/min (ref >60)
Glucose, Bld: 93 mg/dL (ref 70–99)
Potassium: 3.6 mmol/L (ref 3.5–5.1)
Sodium: 146 mmol/L — ABNORMAL HIGH (ref 135–145)
Total Bilirubin: 0.4 mg/dL (ref 0.3–1.2)
Total Protein: 4.1 g/dL — ABNORMAL LOW (ref 6.5–8.1)

## 2021-03-16 LAB — LACTATE DEHYDROGENASE: LDH: 294 U/L — ABNORMAL HIGH (ref 98–192)

## 2021-03-16 MED ORDER — SODIUM CHLORIDE 0.9 % IV SOLN
300.0000 mg/m2 | Freq: Once | INTRAVENOUS | Status: AC
  Administered 2021-03-16: 680 mg via INTRAVENOUS
  Filled 2021-03-16: qty 34

## 2021-03-16 MED ORDER — BORTEZOMIB CHEMO SQ INJECTION 3.5 MG (2.5MG/ML)
1.3000 mg/m2 | Freq: Once | INTRAMUSCULAR | Status: AC
  Administered 2021-03-16: 3 mg via SUBCUTANEOUS
  Filled 2021-03-16: qty 1.2

## 2021-03-16 MED ORDER — SODIUM CHLORIDE 0.9 % IV SOLN
Freq: Once | INTRAVENOUS | Status: AC
  Filled 2021-03-16: qty 250

## 2021-03-16 MED ORDER — DEXAMETHASONE 4 MG PO TABS
ORAL_TABLET | ORAL | Status: AC
  Filled 2021-03-16: qty 10

## 2021-03-16 MED ORDER — DEXAMETHASONE 4 MG PO TABS
40.0000 mg | ORAL_TABLET | Freq: Once | ORAL | Status: AC
  Administered 2021-03-16: 40 mg via ORAL

## 2021-03-16 NOTE — Patient Instructions (Signed)
[facility]  Discharge Instructions: Thank you for choosing [facility] to provide your oncology and hematology care.   If you have a lab appointment with the [facility], please go directly to the [facility] and check in at the registration area.   Wear comfortable clothing and clothing appropriate for easy access to any Portacath or PICC line.   We strive to give you quality time with your provider. You may need to reschedule your appointment if you arrive late (15 or more minutes).  Arriving late affects you and other patients whose appointments are after yours.  Also, if you miss three or more appointments without notifying the office, you may be dismissed from the clinic at the provider's discretion.      For prescription refill requests, have your pharmacy contact our office and allow 72 hours for refills to be completed.    Today you received the following chemotherapy and/or immunotherapy agents cytoxan and boretizomib   To help prevent nausea and vomiting after your treatment, we encourage you to take your nausea medication as directed.  BELOW ARE SYMPTOMS THAT SHOULD BE REPORTED IMMEDIATELY: . *FEVER GREATER THAN 100.4 F (38 C) OR HIGHER . *CHILLS OR SWEATING . *NAUSEA AND VOMITING THAT IS NOT CONTROLLED WITH YOUR NAUSEA MEDICATION . *UNUSUAL SHORTNESS OF BREATH . *UNUSUAL BRUISING OR BLEEDING . *URINARY PROBLEMS (pain or burning when urinating, or frequent urination) . *BOWEL PROBLEMS (unusual diarrhea, constipation, pain near the anus) . TENDERNESS IN MOUTH AND THROAT WITH OR WITHOUT PRESENCE OF ULCERS (sore throat, sores in mouth, or a toothache) . UNUSUAL RASH, SWELLING OR PAIN  . UNUSUAL VAGINAL DISCHARGE OR ITCHING   Items with * indicate a potential emergency and should be followed up as soon as possible or go to the Emergency Department if any problems should occur.  Please show the CHEMOTHERAPY ALERT CARD or  IMMUNOTHERAPY ALERT CARD at check-in to the Emergency Department and triage nurse.  Should you have questions after your visit or need to cancel or reschedule your appointment, please contact [facility]  Dept: [phone]  and follow the prompts.  Office hours are 8:00 a.m. to 4:30 p.m. Monday - Friday. Please note that voicemails left after 4:00 p.m. may not be returned until the following business day.  We are closed weekends and major holidays. You have access to a nurse at all times for urgent questions. Please call the main number to the clinic Dept: [phone] and follow the prompts.   For any non-urgent questions, you may also contact your provider using MyChart. We now offer e-Visits for anyone 63 and older to request care online for non-urgent symptoms. For details visit [email or web address].   Also download the MyChart app! Go to the app store, search "MyChart", open the app, select [facility], and log in with your MyChart username and password.  Due to Covid, a mask is required upon entering the hospital/clinic. If you do not have a mask, one will be given to you upon arrival. For doctor visits, patients may have 1 support person aged 4 or older with them. For treatment visits, patients cannot have anyone with them due to current Covid guidelines and our immunocompromised population.

## 2021-03-16 NOTE — Progress Notes (Signed)
Nutrition Follow-up:  Patient with AL amyloidosis of kidney. He is receiving Dara-CyBorD  Met with patient in waiting area prior to infusion. He reports increased fatigue and decreased appetite secondary to continued diarrhea. Patient reports some improvement with Imodium, He is drinking Ensure/Boost supplements, usually 3/day and tolerates well. Patient reports abdominal cramping followed by loose/watery bowel movements after most meals, notes some bowel movements oily and foul-smelling (symptoms consistent with exocrine pancreatic insufficiency). Patient is scheduled for colonoscopy 5/18.   Medications: reviewed  Labs: Na 146, Albumin 1.5  Anthropometrics: Weight 205 lb 8 oz today increased from 202 lb 14.4 oz on 4/28 and down 5% in the last month; significant  4/22- 206 lb 12.8 oz 4/12 - 212 lb 4/1 - 216 lb 6.4 oz 3/24 - 214 lb 6.4 oz  NUTRITION DIAGNOSIS: Unintentional weight loss stable   INTERVENTION:  Reviewed low-fat diet and encouraged small frequent meals Discussed foods to aid with diarrhea (bananas, rice, applesauce, toast) Discussed Enterade as option, pricing/website information given Encouraged drinking 5 Ensure Enlive/equivalent daily (350 kcal, 20 grams protein each) Complimentary Ensure case provided today Patient agreeable to keeping food and symptom diary, provided log sheets and encouraged to take with him to GI appointment Continue to ?? EPI - consider trial of PERT    MONITORING, EVALUATION, GOAL: weight trends, intake   NEXT VISIT: May 20 via telephone

## 2021-03-19 ENCOUNTER — Ambulatory Visit: Payer: BC Managed Care – PPO

## 2021-03-19 ENCOUNTER — Other Ambulatory Visit: Payer: Self-pay

## 2021-03-19 DIAGNOSIS — M6281 Muscle weakness (generalized): Secondary | ICD-10-CM | POA: Diagnosis not present

## 2021-03-19 DIAGNOSIS — R262 Difficulty in walking, not elsewhere classified: Secondary | ICD-10-CM

## 2021-03-19 DIAGNOSIS — R5381 Other malaise: Secondary | ICD-10-CM

## 2021-03-19 LAB — KAPPA/LAMBDA LIGHT CHAINS
Kappa free light chain: 10.7 mg/L (ref 3.3–19.4)
Kappa, lambda light chain ratio: 0.86 (ref 0.26–1.65)
Lambda free light chains: 12.5 mg/L (ref 5.7–26.3)

## 2021-03-19 NOTE — Therapy (Signed)
[facility] [address], [place], [place], [address] Phone: [phone]   Fax:  [phone]  Physical Therapy Treatment  Patient Details  Name: Samuel Butler MRN: [id number] Date of Birth: 1961/08/24 Referring Provider (PT): Isaac Bliss, Rayford Halsted, MD   Encounter Date: 03/19/2021   PT End of Session - 03/19/21 1227    Visit Number 11    Date for PT Re-Evaluation 06/11/21    Authorization Type BC[employer]    PT Start Time 1147    PT Stop Time 1226    PT Time Calculation (min) 39 min    Activity Tolerance Patient tolerated treatment well    Behavior During Therapy [facility] for tasks assessed/performed           Past Medical History:  Diagnosis Date  . DDD (degenerative disc disease), lumbar    per his report  . Diabetes mellitus without complication ([place])   . Gout 04/16/2013  . Hyperlipidemia   . Hypertension   . IBD (inflammatory bowel disease) 04/05/2016  . Pulmonary embolism ([place])   . Tobacco use disorder 03/28/2015    Past Surgical History:  Procedure Laterality Date  . ABDOMINAL AORTOGRAM W/LOWER EXTREMITY Right 06/26/2020   Procedure: ABDOMINAL AORTOGRAM W/LOWER EXTREMITY;  Surgeon: Lorretta Harp, MD;  Location: [facility];  Service: Cardiovascular;  Laterality: Right;  . COLONOSCOPY    . PERIPHERAL VASCULAR INTERVENTION  06/26/2020   Procedure: PERIPHERAL VASCULAR INTERVENTION;  Surgeon: Lorretta Harp, MD;  Location: [facility] CV [facility];  Service: Cardiovascular;;  Right SFA  . TONSILLECTOMY      There were no vitals filed for this visit.   Subjective Assessment - 03/19/21 1150    Subjective I am having Rt hip pain the past few days.    Currently in Pain? Yes    Pain Score 5     Pain Location Hip    Pain Orientation Right    Pain Descriptors / Indicators Aching    Pain Type Acute pain    Pain Onset More than a month ago    Pain Frequency Intermittent    Aggravating Factors  random  sometimes              [facility] PT Assessment - 03/19/21 0001      Assessment   Medical Diagnosis Z91.81 (ICD-10-CM) - Risk for falls; E85.81 (ICD-10-CM) - Light chain (AL) amyloidosis [facility]    Referring Provider (PT) Isaac Bliss, Rayford Halsted, MD      Prior Function   Level of Independence Independent      Cognition   Overall Cognitive Status Within Functional Limits for tasks assessed      Posture/Postural Control   Posture/Postural Control Postural limitations    Postural Limitations Anterior pelvic tilt;Flexed trunk      Transfers   Five time sit to stand comments  15.18- improved control      6 minute walk test results    Aerobic Endurance Distance Walked 968    Endurance additional comments Rt hip pain that was present before walking.  1 standing rest break.                         [facility] Adult PT Treatment/Exercise - 03/19/21 0001      Knee/Hip Exercises: Aerobic   Other Aerobic Arm bike: Level 1.5 x 4 minutes (2/2)      Knee/Hip Exercises: Machines for Strengthening   Cybex Leg  Press 65# seat 8  2x10, Rt LE 30# Rt and Lt 2x10   fatigue with 2nd set     Knee/Hip Exercises: Seated   Ball Squeeze 2x10    Sit to Sand 2 sets;10 reps      Shoulder Exercises: Seated   Flexion Weight (lbs) 2    Flexion Limitations 3 way raises with 2# 2x10    Other Seated Exercises biceps curls 4# 2x10                    PT Short Term Goals - 02/08/21 1248      PT SHORT TERM GOAL #1   Title 5 x sit to stand in 20 seconds or less    Baseline 14.09    Status Achieved      PT SHORT TERM GOAL #2   Title Pt will be able to go up and down 4 steps reciprocally 3 x using one handrail due to improved strength    Baseline ascending with 1 rail.  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  Able to be upright and functioning all day for light tasks.    Time 12    Period Weeks    Status On-going    Target Date 06/11/21      PT LONG TERM GOAL #2   Title Be able to get up and down off the floor without assistance    Baseline min assist and UE support    Time 12    Period Weeks    Status On-going    Target Date 06/11/21      PT LONG TERM GOAL #3   Title Pt will be able to walk for at least 30 minutes without increased gait deviations or pain for return to work related tasks    Baseline this varies.  Gets fatigued after walking longer distances.  Gets SOB with exertion and took one standing rest break during 6 min walk test today    Time 12    Period Weeks    Status On-going    Target Date 06/11/21      PT LONG TERM GOAL #4   Title Pt will be able to perform single leg stand for at least10 seconds for reduced risk of falls    Time 12    Status Achieved      PT LONG TERM GOAL #5   Title Pt will be able to lift at least 25lb from mat table with good mechanics and carry 20 ft for return to work activities.    Status On-going    Target Date 06/11/21      PT LONG TERM GOAL #6   Title Pt will perform 6 min walk test of at least 1250 ft to improve endurance and community function    Baseline 968 feet- Rt hip pain 3-5/10 (had this before walking)    Time 12    Period Weeks    Status On-going    Target Date 06/11/21  Plan - 03/19/21 1213    Clinical Impression Statement Pt arrived with Rt hip pain that began over the weekend without cause.  Pt has not done much activity at home due to fatigue from most recent treatment.  Pt has had gains in endurance since the start of care and is now able to do yardwork independently. Endurance has improved to allow for being upright most of the day as long as he isn't  performing high exertion tasks. Pt demonstrates LE strength deficits as evident by increased challenge and time with 5x sit to stand.  TIme and control with sit to stand has improved since the start of care. PT is also unable to get up from the floor due to limited LE strength.  PT monitored pt closely during exercise for fatigue and made modifications to exercises as indicated.   PT alternated between upper and lower body exercises today to allow for maximized treatment with less fatigue.  Pt fatigued on 2nd set of leg press.   Pt will continue to benefit from skilled PT for further LE strengthening, endurance training and high-level balance work in order to meet his LTGs and improve function while pt is undergoing cancer treatments.    PT Frequency 2x / week    PT Duration 12 weeks    PT Treatment/Interventions ADLs/Self Care Home Management;Cryotherapy;Electrical Stimulation;Moist Heat;Traction;Gait Scientist, forensic;Therapeutic activities;Therapeutic exercise;Balance training;Neuromuscular re-education;Patient/family education;Manual techniques;Taping    PT Next Visit Plan balance, strength and balance progression as pt tolerates.    PT Home Exercise Plan Access Code: [email or web address]    Consulted and Agree with Plan of Care Patient           Patient will benefit from skilled therapeutic intervention in order to improve the following deficits and impairments:  Pain,Postural dysfunction,Decreased strength,Difficulty walking,Decreased balance,Abnormal gait,Decreased endurance  Visit Diagnosis: Muscle weakness (generalized) - Plan: PT plan of care cert/re-cert  Difficulty in walking, not elsewhere classified - Plan: PT plan of care cert/re-cert  Physical deconditioning - Plan: PT plan of care cert/re-cert     Problem List Patient Active Problem List   Diagnosis Date Noted  . Coronary artery disease 03/13/2021  . Acute pulmonary embolism ([place]) 10/18/2020  . Groin pain, right 08/31/2020  .  Chronic viral hepatitis B without delta-agent ([facility]) 08/21/2020  . Amyloidosis ([facility]) 08/21/2020  . Anasarca 08/04/2020  . Nephrotic syndrome 08/03/2020  . AKI (acute kidney injury) ([place]) 08/03/2020  . Hypokalemia 06/27/2020  . Claudication in peripheral vascular disease ([place]) 06/26/2020  . Hypertension associated with diabetes ([place]) 05/25/2020  . Peripheral arterial disease ([place]) 03/08/2020  . Vitamin D deficiency 10/26/2019  . Hypoalbuminemia 10/26/2019  . IBD (inflammatory bowel disease) 04/05/2016  . Tobacco use disorder 03/28/2015  . Non-insulin treated type 2 diabetes mellitus ([place]) 01/25/2014  . Essential hypertension, benign 04/16/2013  . Hyperlipemia 04/16/2013  . Gout 04/16/2013     Sigurd Sos, PT 03/19/21 12:29 PM  [facility] [address], [place], [place], [address] Phone: [phone]   Fax:  [phone]  Name: Jaquavion Mccannon Rolfson MRN: [id number] Date of Birth: 1961-10-13

## 2021-03-20 LAB — MULTIPLE MYELOMA PANEL, SERUM
Albumin SerPl Elph-Mcnc: 1.8 g/dL — ABNORMAL LOW (ref 2.9–4.4)
Albumin/Glob SerPl: 0.9 (ref 0.7–1.7)
Alpha 1: 0.2 g/dL (ref 0.0–0.4)
Alpha2 Glob SerPl Elph-Mcnc: 1.1 g/dL — ABNORMAL HIGH (ref 0.4–1.0)
B-Globulin SerPl Elph-Mcnc: 0.7 g/dL (ref 0.7–1.3)
Gamma Glob SerPl Elph-Mcnc: 0.1 g/dL — ABNORMAL LOW (ref 0.4–1.8)
Globulin, Total: 2.1 g/dL — ABNORMAL LOW (ref 2.2–3.9)
IgA: 28 mg/dL — ABNORMAL LOW (ref 61–437)
IgG (Immunoglobin G), Serum: 150 mg/dL — ABNORMAL LOW (ref 603–1613)
IgM (Immunoglobulin M), Srm: 16 mg/dL — ABNORMAL LOW (ref 20–172)
M Protein SerPl Elph-Mcnc: 0.1 g/dL — ABNORMAL HIGH
Total Protein ELP: 3.9 g/dL — ABNORMAL LOW (ref 6.0–8.5)

## 2021-03-22 ENCOUNTER — Encounter: Payer: Self-pay | Admitting: Gastroenterology

## 2021-03-22 ENCOUNTER — Telehealth: Payer: Self-pay | Admitting: Physical Therapy

## 2021-03-22 ENCOUNTER — Ambulatory Visit: Payer: BC Managed Care – PPO | Admitting: Physical Therapy

## 2021-03-22 NOTE — Telephone Encounter (Signed)
Cancel within 24hr. Pt states his car would not start.   10:33 AM,03/22/21 Sherol Dade PT, [place] at [facility]

## 2021-03-23 ENCOUNTER — Inpatient Hospital Stay: Payer: BC Managed Care – PPO

## 2021-03-23 ENCOUNTER — Encounter: Payer: Self-pay | Admitting: Hematology and Oncology

## 2021-03-23 ENCOUNTER — Other Ambulatory Visit: Payer: Self-pay

## 2021-03-23 ENCOUNTER — Inpatient Hospital Stay (HOSPITAL_BASED_OUTPATIENT_CLINIC_OR_DEPARTMENT_OTHER): Payer: BC Managed Care – PPO | Admitting: Hematology and Oncology

## 2021-03-23 VITALS — BP 130/90 | HR 94 | Temp 97.8°F | Resp 19 | Ht 71.5 in | Wt 205.5 lb

## 2021-03-23 DIAGNOSIS — N049 Nephrotic syndrome with unspecified morphologic changes: Secondary | ICD-10-CM | POA: Diagnosis not present

## 2021-03-23 DIAGNOSIS — B181 Chronic viral hepatitis B without delta-agent: Secondary | ICD-10-CM | POA: Diagnosis not present

## 2021-03-23 DIAGNOSIS — E8581 Light chain (AL) amyloidosis: Secondary | ICD-10-CM

## 2021-03-23 DIAGNOSIS — E854 Organ-limited amyloidosis: Secondary | ICD-10-CM

## 2021-03-23 DIAGNOSIS — Z5112 Encounter for antineoplastic immunotherapy: Secondary | ICD-10-CM | POA: Diagnosis not present

## 2021-03-23 LAB — CBC WITH DIFFERENTIAL (CANCER CENTER ONLY)
Abs Immature Granulocytes: 0.01 10*3/uL (ref 0.00–0.07)
Basophils Absolute: 0 10*3/uL (ref 0.0–0.1)
Basophils Relative: 0 %
Eosinophils Absolute: 0.1 10*3/uL (ref 0.0–0.5)
Eosinophils Relative: 4 %
HCT: 33.1 % — ABNORMAL LOW (ref 39.0–52.0)
Hemoglobin: 10.8 g/dL — ABNORMAL LOW (ref 13.0–17.0)
Immature Granulocytes: 0 %
Lymphocytes Relative: 18 %
Lymphs Abs: 0.5 10*3/uL — ABNORMAL LOW (ref 0.7–4.0)
MCH: 32.8 pg (ref 26.0–34.0)
MCHC: 32.6 g/dL (ref 30.0–36.0)
MCV: 100.6 fL — ABNORMAL HIGH (ref 80.0–100.0)
Monocytes Absolute: 0.3 10*3/uL (ref 0.1–1.0)
Monocytes Relative: 12 %
Neutro Abs: 2 10*3/uL (ref 1.7–7.7)
Neutrophils Relative %: 66 %
Platelet Count: 117 10*3/uL — ABNORMAL LOW (ref 150–400)
RBC: 3.29 MIL/uL — ABNORMAL LOW (ref 4.22–5.81)
RDW: 15.1 % (ref 11.5–15.5)
WBC Count: 3 10*3/uL — ABNORMAL LOW (ref 4.0–10.5)
nRBC: 0 % (ref 0.0–0.2)

## 2021-03-23 LAB — CMP (CANCER CENTER ONLY)
ALT: 29 U/L (ref 0–44)
AST: 31 U/L (ref 15–41)
Albumin: 1.6 g/dL — ABNORMAL LOW (ref 3.5–5.0)
Alkaline Phosphatase: 81 U/L (ref 38–126)
Anion gap: 9 (ref 5–15)
BUN: 17 mg/dL (ref 8–23)
CO2: 29 mmol/L (ref 22–32)
Calcium: 7.8 mg/dL — ABNORMAL LOW (ref 8.9–10.3)
Chloride: 107 mmol/L (ref 98–111)
Creatinine: 1.1 mg/dL (ref 0.61–1.24)
GFR, Estimated: >60 mL/min (ref >60)
Glucose, Bld: 90 mg/dL (ref 70–99)
Potassium: 3.4 mmol/L — ABNORMAL LOW (ref 3.5–5.1)
Sodium: 145 mmol/L (ref 135–145)
Total Bilirubin: 0.4 mg/dL (ref 0.3–1.2)
Total Protein: 4.1 g/dL — ABNORMAL LOW (ref 6.5–8.1)

## 2021-03-23 LAB — LACTATE DEHYDROGENASE: LDH: 275 U/L — ABNORMAL HIGH (ref 98–192)

## 2021-03-23 MED ORDER — DIPHENHYDRAMINE HCL 25 MG PO CAPS
ORAL_CAPSULE | ORAL | Status: AC
  Filled 2021-03-23: qty 2

## 2021-03-23 MED ORDER — DEXAMETHASONE 4 MG PO TABS
20.0000 mg | ORAL_TABLET | Freq: Once | ORAL | Status: AC
  Administered 2021-03-23: 20 mg via ORAL

## 2021-03-23 MED ORDER — DIPHENHYDRAMINE HCL 25 MG PO CAPS
50.0000 mg | ORAL_CAPSULE | Freq: Once | ORAL | Status: AC
  Administered 2021-03-23: 50 mg via ORAL

## 2021-03-23 MED ORDER — DARATUMUMAB-HYALURONIDASE-FIHJ 1800-30000 MG-UT/15ML ~~LOC~~ SOLN
1800.0000 mg | Freq: Once | SUBCUTANEOUS | Status: AC
  Administered 2021-03-23: 1800 mg via SUBCUTANEOUS
  Filled 2021-03-23: qty 15

## 2021-03-23 MED ORDER — DEXAMETHASONE 4 MG PO TABS
ORAL_TABLET | ORAL | Status: AC
  Filled 2021-03-23: qty 5

## 2021-03-23 MED ORDER — ACETAMINOPHEN 325 MG PO TABS
650.0000 mg | ORAL_TABLET | Freq: Once | ORAL | Status: AC
  Administered 2021-03-23: 650 mg via ORAL

## 2021-03-23 MED ORDER — ACETAMINOPHEN 325 MG PO TABS
ORAL_TABLET | ORAL | Status: AC
  Filled 2021-03-23: qty 2

## 2021-03-23 NOTE — Progress Notes (Signed)
[facility] [phone]   [phone]  PROGRESS NOTE  Patient Care Team: Isaac Bliss, Rayford Halsted, MD as PCP - General (Internal Medicine) Lorretta Harp, MD as PCP - Cardiology (Cardiology)  Hematological/Oncological History # AL Amyloidosis of the Kidney 1) 08/03/2020: Free kappa 19.5, Lambda 205.7, Kappa/Lambda ratio: 0.09. M protein undetectable in serum.  2) 08/07/2020: kidney biopsy performed, results consistent with lambda light chain AL amyloidosis 3) 08/23/2020: establish care with Dr. Lorenso Courier  4) 09/01/2020: bone marrow biopsy shows plasma cell neoplasm with focal amyloid deposits 5) 09/29/2020: start of Cycle 1 Day 1 of Dara-CyBorD 6) 11/02/2020: Cycle 2 Day 1 of Dara-CyBorD 7) 12/01/2020: Cycle 3 Day 1 of Dara-CyBorD 8) 12/29/2020: Cycle 4 Day 1 of Dara-CyBorD 9) 01/05/2021: Holding Velcade due to diarrhea.  10) 01/19/2021: Adding back Velcade due to improvement of diarrhea with imodium 11) 01/26/2021: Cycle 5 Day 1 of Dara-CyBorD 12) 02/23/2021: Cycle 6 Day 1 of Dara-CyBorD 13) 03/23/2021: Cycle 7 Day 1 of Dara-CyBorD  Interval History:  Samuel Butler 60 y.o. male with medical history significant for AL amyloidosis of the kidney presents for a follow up visit. The patient's last visit was on 02/23/2021. In the interim since his last visit he has continued his treatment of Dara-CyBorD. Today, he is due for Cycle 7 Day 1.   On exam today Samuel Butler reports he has not been feeling well over the last week.  He unfortunately having increased swelling in his legs and some pain in his left wrist.  He notes that leg swelling has been doing quite well with his compression socks but over the last week progressively worsened.  The diarrhea is "still tough".  No he does currently take Imodium to try to help with this and that has improved his appetite.  He has had some steady weight gain since our last visit.  He is otherwise willing and able to proceed with  treatment today. He denies any fevers, chills, night sweats, worsening shortness of breath, chest pain or cough.  He has no other complaints.  A full 10 point ROS is listed below.  MEDICAL HISTORY:  Past Medical History:  Diagnosis Date  . DDD (degenerative disc disease), lumbar    per his report  . Diabetes mellitus without complication ([place])   . Gout 04/16/2013  . Hyperlipidemia   . Hypertension   . IBD (inflammatory bowel disease) 04/05/2016  . Pulmonary embolism ([place])   . Tobacco use disorder 03/28/2015    SURGICAL HISTORY: Past Surgical History:  Procedure Laterality Date  . ABDOMINAL AORTOGRAM W/LOWER EXTREMITY Right 06/26/2020   Procedure: ABDOMINAL AORTOGRAM W/LOWER EXTREMITY;  Surgeon: Lorretta Harp, MD;  Location: [facility];  Service: Cardiovascular;  Laterality: Right;  . COLONOSCOPY    . PERIPHERAL VASCULAR INTERVENTION  06/26/2020   Procedure: PERIPHERAL VASCULAR INTERVENTION;  Surgeon: Lorretta Harp, MD;  Location: [facility];  Service: Cardiovascular;;  Right SFA  . TONSILLECTOMY      SOCIAL HISTORY: Social History   Socioeconomic History  . Marital status: Married    Spouse name: Not on file  . Number of children: Not on file  . Years of education: Not on file  . Highest education level: Not on file  Occupational History  . Not on file  Tobacco Use  . Smoking status: Former Smoker    Types: Cigarettes    Quit date: 10/23/2016    Years since quitting: 4.4  . Smokeless  tobacco: Never Used  . Tobacco comment: per patient 5 cigarettes a day   Vaping Use  . Vaping Use: Never used  Substance and Sexual Activity  . Alcohol use: Not Currently    Comment: occasional  . Drug use: No  . Sexual activity: Not on file  Other Topics Concern  . 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 Difficulty of Paying Living Expenses: Somewhat hard  Food Insecurity: No Food Insecurity  . Worried About Charity fundraiser in the Last Year: Never true  . Ran Out of Food in the Last Year: Never true  Transportation Needs: No Transportation Needs  . Lack of Transportation (Medical): No  . Lack of Transportation (Non-Medical): No  Physical Activity: Not on file  Stress: Not on file  Social Connections: Not on file  Intimate Partner Violence: Not on file    FAMILY HISTORY: Family History  Problem Relation Age of Onset  . Hypertension Father   . Diabetes Father   . Stroke Father   . Pancreatic cancer Father   . Colon cancer Brother   . Esophageal cancer Neg Hx   . Stomach cancer Neg Hx     ALLERGIES:  is allergic to crestor [rosuvastatin calcium].  MEDICATIONS:  Current Outpatient Medications  Medication Sig Dispense Refill  . acetaminophen (TYLENOL) 650 MG CR tablet Take 1,300 mg by mouth every 8 (eight) hours as needed for pain.     Marland Kitchen acyclovir (ZOVIRAX) 400 MG tablet Take 1 tablet (400 mg total) by mouth 2 (two) times daily. 60 tablet 3  . albuterol (VENTOLIN HFA) 108 (90 Base) MCG/ACT inhaler Inhale 2 puffs into the lungs every 6 (six) hours as needed for wheezing or shortness of breath. 8 g 0  . apixaban (ELIQUIS) 5 MG TABS tablet Take 1 tablet (5 mg total) by mouth 2 (two) times daily. 180 tablet 1  . atorvastatin (LIPITOR) 80 MG tablet Take 1 tablet by mouth once daily 90 tablet 0  . cilostazol (PLETAL) 50 MG tablet Take 1 tablet (50 mg total) by mouth 2 (two) times daily. 60 tablet 3  . clopidogrel (PLAVIX) 75 MG tablet Take 1 tablet (75 mg total) by mouth daily with breakfast. 90 tablet 3  . dicyclomine (BENTYL) 20 MG tablet Take 1 tablet (20 mg total) by mouth 4 (four) times daily -  before meals and at bedtime. Take 20-30 minutes before meals. 120 tablet 2  .  Evolocumab (REPATHA SURECLICK) 462 MG/ML SOAJ Inject 140 mg into the skin every 14 (fourteen) days. 2 mL 12  . ezetimibe (ZETIA) 10 MG tablet Take 1 tablet (10 mg total) by mouth daily. 90 tablet 3  . isosorbide mononitrate (IMDUR) 30 MG 24 hr tablet Take 1 tablet by mouth once daily 90 tablet 1  . loperamide (IMODIUM) 1 MG/5ML solution Take 2 mg by mouth as needed for diarrhea or loose stools.    Marland Kitchen losartan (COZAAR) 50 MG tablet Take 1 tablet (50 mg total) by mouth daily. 90 tablet 3  . metFORMIN (GLUCOPHAGE) 500 MG tablet Take 1 tablet by mouth once daily with breakfast 90 tablet  0  . ondansetron (ZOFRAN) 8 MG tablet TAKE 1 TABLET BY MOUTH EVERY 8 HOURS AS NEEDED FOR NAUSEA OR VOMITING 30 tablet 0  . pantoprazole (PROTONIX) 40 MG tablet Take 1 tablet (40 mg total) by mouth daily. 90 tablet 1  . potassium chloride SA (KLOR-CON) 20 MEQ tablet Take 2 tablets (40 mEq total) by mouth daily. 80MG x3 days then back to 40 mg/day 33 tablet 6  . torsemide (DEMADEX) 20 MG tablet Take 1 tablet (20 mg total) by mouth 2 (two) times daily. 51m BID x 3 days then back to 288mBID 33 tablet 6  . VEMLIDY 25 MG TABS Take 1 tablet (25 mg total) by mouth daily. 30 tablet 5  . Vitamin D, Ergocalciferol, (DRISDOL) 1.25 MG (50000 UNIT) CAPS capsule Take 1 capsule by mouth once a week 12 capsule 0   No current facility-administered medications for this visit.    REVIEW OF SYSTEMS:   Constitutional: ( - ) fevers, ( - )  chills , ( - ) night sweats Eyes: ( - ) blurriness of vision, ( - ) double vision, ( - ) watery eyes Ears, nose, mouth, throat, and face: ( - ) mucositis, ( - ) sore throat Respiratory: ( - ) cough, ( +) dyspnea, ( - ) wheezes Cardiovascular: ( - ) palpitation, ( - ) chest discomfort, ( +) lower extremity swelling Gastrointestinal:  ( - ) nausea, ( - ) heartburn, ( + ) change in bowel habits Skin: ( - ) abnormal skin rashes Lymphatics: ( - ) new lymphadenopathy, ( - ) easy bruising Neurological: (+)  numbness, ( - ) tingling, ( - ) new weaknesses Behavioral/Psych: ( - ) mood change, ( - ) new changes  All other systems were reviewed with the patient and are negative.  PHYSICAL EXAMINATION: ECOG PERFORMANCE STATUS: 1 - Symptomatic but completely ambulatory  Vitals:   03/23/21 1012  BP: 130/90  Pulse: 94  Resp: 19  Temp: 97.8 F (36.6 C)  SpO2: 99%   Filed Weights   03/23/21 1012  Weight: 205 lb 8 oz (93.2 kg)    GENERAL: well appearing middle aged AfSerbiamerican male alert, no distress and comfortable SKIN: skin color, texture, turgor are normal, no rashes or significant lesions EYES: conjunctiva are pink and non-injected, sclera clear LUNGS: clear to auscultation and percussion with normal breathing effort HEART: regular rate & rhythm and no murmurs and +2 lower extremity edema bilaterally  Musculoskeletal: no cyanosis of digits and no clubbing  PSYCH: alert & oriented x 3, fluent speech NEURO: no focal motor/sensory deficits  LABORATORY DATA:  I have reviewed the data as listed CBC Latest Ref Rng & Units 03/23/2021 03/16/2021 03/08/2021  WBC 4.0 - 10.5 K/uL 3.0(L) 3.2(L) 4.3  Hemoglobin 13.0 - 17.0 g/dL 10.8(L) 10.9(L) 11.7(L)  Hematocrit 39.0 - 52.0 % 33.1(L) 32.0(L) 35.6(L)  Platelets 150 - 400 K/uL 117(L) 125(L) 126(L)    CMP Latest Ref Rng & Units 03/23/2021 03/16/2021 03/08/2021  Glucose 70 - 99 mg/dL 90 93 86  BUN 8 - 23 mg/dL 17 20 20   Creatinine 0.61 - 1.24 mg/dL 1.10 1.06 1.01  Sodium 135 - 145 mmol/L 145 146(H) 145  Potassium 3.5 - 5.1 mmol/L 3.4(L) 3.6 3.2(L)  Chloride 98 - 111 mmol/L 107 108 108  CO2 22 - 32 mmol/L 29 30 28   Calcium 8.9 - 10.3 mg/dL 7.8(L) 7.6(L) 7.7(L)  Total Protein 6.5 - 8.1 g/dL 4.1(L) 4.1(L) 4.3(L)  Total Bilirubin 0.3 - 1.2  mg/dL 0.4 0.4 0.4  Alkaline Phos 38 - 126 U/L 81 76 80  AST 15 - 41 U/L 31 20 30   ALT 0 - 44 U/L 29 19 39    Lab Results  Component Value Date   MPROTEIN 0.1 (H) 03/16/2021   MPROTEIN 0.1 (H) 02/09/2021    MPROTEIN Not Observed 01/12/2021   Lab Results  Component Value Date   KPAFRELGTCHN 10.7 03/16/2021   KPAFRELGTCHN 10.2 02/09/2021   KPAFRELGTCHN 11.7 01/12/2021   LAMBDASER 12.5 03/16/2021   LAMBDASER 13.7 02/09/2021   LAMBDASER 16.9 01/12/2021   KAPLAMBRATIO 0.86 03/16/2021   KAPLAMBRATIO 1.51 (L) 02/26/2021   KAPLAMBRATIO 0.74 02/09/2021     RADIOGRAPHIC STUDIES: US Abdomen Limited RUQ (LIVER/GB)  Result Date: 03/01/2021 CLINICAL DATA:  Chronic viral hepatitis B. EXAM: ULTRASOUND ABDOMEN LIMITED RIGHT UPPER QUADRANT COMPARISON:  August 29, 2020 FINDINGS: Gallbladder: No gallstones or wall thickening visualized (0.9 mm). No sonographic Murphy sign noted by sonographer. Common bile duct: Diameter: 4.8 mm Liver: Multiple small shadowing echogenic foci are seen within the liver parenchyma (the largest measures approximately 1.6 cm). There is diffusely increased echogenicity of the liver parenchyma which is nodular in contour. Portal vein is patent on color Doppler imaging with normal direction of blood flow towards the liver. Other: None. IMPRESSION: 1. Fatty, cirrhotic liver with multiple parenchymal calcifications. These may represent multiple calcified granulomas. Electronically Signed   By: Virgina Norfolk M.D.   On: 03/01/2021 20:10    ASSESSMENT & PLAN Cleotis A Hack 60 y.o. male with medical history significant for AL amyloidosis of the kidney presents for a follow up visit.  After review the labs, the records, discussion with the patient the findings are most consistent with AL amyloidosis affecting the kidney.  As such I recommended we start treatment with Dara-CyBorD.  After reviewing the biopsy results his findings are most consistent with AL amyloidosis. As such the treatment of choice would be to target his plasma cell population with a triplet or quadruplet therapy. Therapy of choice in this case would consist of daratumumab, Velcade, cyclophosphamide, and dexamethasone.  Given the patient's good functional status we will start with full dose Dara-CyBorD. I previously discussed the side effects of this chemotherapy with the patient including neuropathy, elevated blood pressure, drop in blood counts, hypersensitivity reaction, chest tightness, increased infection risk, and fatigue. The patient and family voiced their understanding of these findings and are agreeable to moving forward with quadruple therapy with the goal of being a bridge to bone marrow transplant.   The regimen of choice is daratumumab, bortezomib, cyclophosphamide and dexamethasone per the ANDROMEDA Study ( Blood. 2020 Jul 2;136(1):71-80). Treatment consists of: Cyclophosphamide 300 mg/m2 intravenously and bortezomib 1.3 mg/m2 subcutaneously were given on days 1, 8, 15, and 22 of each 28 day cycle for up to 6 cycles. Dexamethasone 40 mg (starting dose) was given orally or intravenously weekly for each cycle for up to 6 cycles. DARA [place] was administered in a single, premixed vial and given by manual [place] injection over the course of 3 to 5 minutes weekly in cycles 1 to 2, every 2 weeks in cycles 3 to 6, and every 4 weeks thereafter as monotherapy for a maximum of 2 years. We would consider this continued dosing regimen.  On exam today,  Samuel Butler had increased swelling of his legs and continued diarrhea, but is otherwise stable.  His weight has been increasing slowly over the last month. He is scheduled for a colonoscopy on 03/28/2021.  Labs from today were reviewed without any intervention needed.  Patient will proceed with treatment as planned.  Patient will return to the clinic in 4 weeks as scheduled.  #AL Amyloidosis Affecting the Kidney --bone marrow biopsy and kidney biopsy helped to confirm the diagnosis of AL amyloidosis. --currently being treated with  Dara-CyBorD (per the [facility] Trial), started Cycle 1 Day 1 on 09/29/2020.  --today is Cycle 7 Day 1 of treatment. Moving forward the patient will  only require monthly injections.  --will monitoring monthly SPEP, UPEP, and SFLC while on treatment. --will have patient return for weekly treatment with q 4 week clinic visits  #Diarrhea with abdominal cramping --Improved with Imodium and Bentyl, but still has some persistent symptoms.  --Due to poor taste with liquid Imodium, recommended to switch to tablets. Advised to be more aggressive with Imodium since patient only takes it once a day.  --GI scheduled EGD/Colonoscopy on 03/28/2021   #Weight Loss, stable --Diarrhea combined with diuresis is likely causing his weight loss --weight decreased to 202, down 10 lbs from 02/20/2021. Stable at 205 lbs today.  --Under the care of dietician and drinking protein shakes --continue to monitor  #Injection Site Rash, improved --appears benign on exam today  --completed antibiotic therapy  #Supportive Care --chemotherapy education performed --zofran 44m q8H PRN and compazine 143mPO q6H for nausea --acyclovir 40063mO BID for VCZ prophylaxis --albuterol HFA inhaler for daratumumab treatment -- EMLA cream for port not required, patient declines port at this time, but will reconsider if patient desires.  -- no pain medication required at this time.   No orders of the defined types were placed in this encounter.   All questions were answered. The patient knows to call the clinic with any problems, questions or concerns.  A total of more than 30 minutes were spent on this encounter and over half of that time was spent on counseling and coordination of care as outlined above.   JohLedell PeoplesD Department of Hematology/Oncology [facility]: [phone]: [phone]: [email or web address]   03/23/2021 10:28 AM   Literature Support:  V, Wendie Chess, Weiss BM, VerRush Landmarkerlini G, Comenzo RL. Daratumumab plus CyBorD for patients with newly diagnosed AL amyloidosis:  safety run-in results of ANDROMEDA. Blood. 2020 Jul 2;136(1):71-80.  --Daratumumab-CyBorD was well tolerated, with no new safety concerns versus the intravenous formulation, and demonstrated robust hematologic and organ responses.

## 2021-03-23 NOTE — Patient Instructions (Signed)
[facility]  Discharge Instructions: °Thank you for choosing [facility] to provide your oncology and hematology care.  ° °If you have a lab appointment with the Cancer Center, please go directly to the Cancer Center and check in at the registration area. °  °Wear comfortable clothing and clothing appropriate for easy access to any Portacath or PICC line.  ° °We strive to give you quality time with your provider. You may need to reschedule your appointment if you arrive late (15 or more minutes).  Arriving late affects you and other patients whose appointments are after yours.  Also, if you miss three or more appointments without notifying the office, you may be dismissed from the clinic at the provider’s discretion.    °  °For prescription refill requests, have your pharmacy contact our office and allow 72 hours for refills to be completed.   ° °Today you received the following chemotherapy and/or immunotherapy agents: Daratumumab.     °  °To help prevent nausea and vomiting after your treatment, we encourage you to take your nausea medication as directed. ° °BELOW ARE SYMPTOMS THAT SHOULD BE REPORTED IMMEDIATELY: °*FEVER GREATER THAN 100.4 F (38 °C) OR HIGHER °*CHILLS OR SWEATING °*NAUSEA AND VOMITING THAT IS NOT CONTROLLED WITH YOUR NAUSEA MEDICATION °*UNUSUAL SHORTNESS OF BREATH °*UNUSUAL BRUISING OR BLEEDING °*URINARY PROBLEMS (pain or burning when urinating, or frequent urination) °*BOWEL PROBLEMS (unusual diarrhea, constipation, pain near the anus) °TENDERNESS IN MOUTH AND THROAT WITH OR WITHOUT PRESENCE OF ULCERS (sore throat, sores in mouth, or a toothache) °UNUSUAL RASH, SWELLING OR PAIN  °UNUSUAL VAGINAL DISCHARGE OR ITCHING  ° °Items with * indicate a potential emergency and should be followed up as soon as possible or go to the Emergency Department if any problems should occur. ° °Please show the CHEMOTHERAPY ALERT CARD or IMMUNOTHERAPY ALERT CARD at check-in  to the Emergency Department and triage nurse. ° °Should you have questions after your visit or need to cancel or reschedule your appointment, please contact [facility]  Dept: [phone]  and follow the prompts.  Office hours are 8:00 a.m. to 4:30 p.m. Monday - Friday. Please note that voicemails left after 4:00 p.m. may not be returned until the following business day.  We are closed weekends and major holidays. You have access to a nurse at all times for urgent questions. Please call the main number to the clinic Dept: [phone] and follow the prompts. ° ° °For any non-urgent questions, you may also contact your provider using MyChart. We now offer e-Visits for anyone 18 and older to request care online for non-urgent symptoms. For details visit [email or web address]. °  °Also download the MyChart app! Go to the app store, search "MyChart", open the app, select [facility], and log in with your MyChart username and password. ° °Due to Covid, a mask is required upon entering the hospital/clinic. If you do not have a mask, one will be given to you upon arrival. For doctor visits, patients may have 1 support person aged 18 or older with them. For treatment visits, patients cannot have anyone with them due to current Covid guidelines and our immunocompromised population.  ° °

## 2021-03-24 ENCOUNTER — Other Ambulatory Visit: Payer: Self-pay | Admitting: Internal Medicine

## 2021-03-26 ENCOUNTER — Inpatient Hospital Stay (HOSPITAL_COMMUNITY): Admission: RE | Admit: 2021-03-26 | Payer: BC Managed Care – PPO | Source: Ambulatory Visit

## 2021-03-26 ENCOUNTER — Telehealth: Payer: Self-pay | Admitting: Hematology and Oncology

## 2021-03-26 NOTE — Telephone Encounter (Signed)
Scheduled per los. Called and left msg. Mailed printout  °

## 2021-03-27 ENCOUNTER — Encounter: Payer: BC Managed Care – PPO | Admitting: Physical Therapy

## 2021-03-27 ENCOUNTER — Encounter: Payer: Self-pay | Admitting: Internal Medicine

## 2021-03-27 ENCOUNTER — Other Ambulatory Visit: Payer: Self-pay | Admitting: *Deleted

## 2021-03-27 ENCOUNTER — Encounter: Payer: Self-pay | Admitting: Physical Therapy

## 2021-03-27 ENCOUNTER — Encounter: Payer: Self-pay | Admitting: Hematology and Oncology

## 2021-03-27 DIAGNOSIS — N049 Nephrotic syndrome with unspecified morphologic changes: Secondary | ICD-10-CM

## 2021-03-27 DIAGNOSIS — E8581 Light chain (AL) amyloidosis: Secondary | ICD-10-CM

## 2021-03-28 ENCOUNTER — Other Ambulatory Visit: Payer: Self-pay

## 2021-03-28 ENCOUNTER — Other Ambulatory Visit: Payer: Self-pay | Admitting: *Deleted

## 2021-03-28 ENCOUNTER — Ambulatory Visit (AMBULATORY_SURGERY_CENTER): Payer: BC Managed Care – PPO | Admitting: Gastroenterology

## 2021-03-28 ENCOUNTER — Encounter: Payer: Self-pay | Admitting: Gastroenterology

## 2021-03-28 VITALS — BP 131/91 | HR 75 | Temp 98.3°F | Resp 14 | Ht 71.0 in | Wt 210.0 lb

## 2021-03-28 DIAGNOSIS — R933 Abnormal findings on diagnostic imaging of other parts of digestive tract: Secondary | ICD-10-CM

## 2021-03-28 DIAGNOSIS — R197 Diarrhea, unspecified: Secondary | ICD-10-CM

## 2021-03-28 DIAGNOSIS — K2970 Gastritis, unspecified, without bleeding: Secondary | ICD-10-CM | POA: Diagnosis not present

## 2021-03-28 DIAGNOSIS — E8581 Light chain (AL) amyloidosis: Secondary | ICD-10-CM

## 2021-03-28 DIAGNOSIS — K640 First degree hemorrhoids: Secondary | ICD-10-CM

## 2021-03-28 DIAGNOSIS — R1310 Dysphagia, unspecified: Secondary | ICD-10-CM | POA: Diagnosis not present

## 2021-03-28 DIAGNOSIS — K31A0 Gastric intestinal metaplasia, unspecified: Secondary | ICD-10-CM | POA: Diagnosis not present

## 2021-03-28 DIAGNOSIS — K2950 Unspecified chronic gastritis without bleeding: Secondary | ICD-10-CM | POA: Diagnosis not present

## 2021-03-28 MED ORDER — SODIUM CHLORIDE 0.9 % IV SOLN: 500.0000 mL | Freq: Once | INTRAVENOUS | Status: AC

## 2021-03-28 NOTE — Progress Notes (Signed)
Patient's CBG was 60 on admission, no symptoms stated.  D5 ran for 15 mins and CBG is now 102.  D5 clamped off and NS now running KVO.

## 2021-03-28 NOTE — Op Note (Signed)
[facility] Patient Name: Samuel Butler Procedure Date: 03/28/2021 1:57 PM MRN: [id number] Endoscopist: Ladene Artist , MD Age: 60 Referring MD:  Date of Birth: Jun 11, 1961 Gender: Male Account #: [id number] Procedure:                Upper GI endoscopy Indications:              Dysphagia, Abnormal esophagram Medicines:                Monitored Anesthesia Care Procedure:                Pre-Anesthesia Assessment:                           - Prior to the procedure, a History and Physical                            was performed, and patient medications and                            allergies were reviewed. The patient's tolerance of                            previous anesthesia was also reviewed. The risks                            and benefits of the procedure and the sedation                            options and risks were discussed with the patient.                            All questions were answered, and informed consent                            was obtained. Prior Anticoagulants: The patient has                            taken Plavix (clopidogrel), last dose was 5 days                            prior, and Eliquis, last dose 2 days prior to                            procedure. ASA Grade Assessment: III - A patient                            with severe systemic disease. After reviewing the                            risks and benefits, the patient was deemed in                            satisfactory condition to undergo the procedure.  After obtaining informed consent, the endoscope was                            passed under direct vision. Throughout the                            procedure, the patient's blood pressure, pulse, and                            oxygen saturations were monitored continuously. The                            Endoscope was introduced through the mouth, and                            advanced to the second  part of duodenum. The upper                            GI endoscopy was accomplished without difficulty.                            The patient tolerated the procedure well. Scope In: Scope Out: Findings:                 No endoscopic abnormality was evident in the                            esophagus to explain the patient's complaint of                            dysphagia. It was decided, however, to proceed with                            dilation of the entire esophagus. A guidewire was                            placed and the scope was withdrawn. Dilation was                            performed with a Savary dilator with no resistance                            at 17 mm.                           Patchy mildly erythematous mucosa without bleeding                            was found in the entire examined stomach. Biopsies                            were taken with a cold forceps for histology.  The exam of the stomach was otherwise normal.                           The duodenal bulb and second portion of the                            duodenum were normal. Complications:            No immediate complications. Estimated Blood Loss:     Estimated blood loss was minimal. Impression:               - No endoscopic esophageal abnormality to explain                            patient's dysphagia. Esophagus dilated.                           - Erythematous mucosa in the stomach. Biopsied.                           - Normal duodenal bulb and second portion of the                            duodenum. Recommendation:           - Patient has a contact number available for                            emergencies. The signs and symptoms of potential                            delayed complications were discussed with the                            patient. Return to normal activities tomorrow.                            Written discharge instructions were provided to  the                            patient.                           - Clear liquid diet for 2 hours, then advance as                            tolerated to soft diet today.                           - Resume prior diet tomorrow.                           - Continue present medications.                           - Await pathology results.                           -  Resume Plavix (clopidogrel) at prior dose in 2                            days and Eliquis at prior dose in 2 days. Refer to                            managing physician for further adjustment of                            therapy. Ladene Artist, MD 03/28/2021 2:34:25 PM This report has been signed electronically.

## 2021-03-28 NOTE — Patient Instructions (Signed)
Impression/Recommendations:  Dilation diet and hemorrhoid handouts given to patient.  Clear liquid diet for 2 hours, then advance as tolerated to soft diet today.  Resume previous diet tomorrow.  Continue present medications. Await pathology results.  Resume Plavix (clopidogrel) at prior dose in 2 days.  Resume Eliquis at prior dose in 2 days.  YOU HAD AN ENDOSCOPIC PROCEDURE TODAY AT [facility]:   Refer to the procedure report that was given to you for any specific questions about what was found during the examination.  If the procedure report does not answer your questions, please call your gastroenterologist to clarify.  If you requested that your care partner not be given the details of your procedure findings, then the procedure report has been included in a sealed envelope for you to review at your convenience later.  YOU SHOULD EXPECT: Some feelings of bloating in the abdomen. Passage of more gas than usual.  Walking can help get rid of the air that was put into your GI tract during the procedure and reduce the bloating. If you had a lower endoscopy (such as a colonoscopy or flexible sigmoidoscopy) you may notice spotting of blood in your stool or on the toilet paper. If you underwent a bowel prep for your procedure, you may not have a normal bowel movement for a few days.  Please Note:  You might notice some irritation and congestion in your nose or some drainage.  This is from the oxygen used during your procedure.  There is no need for concern and it should clear up in a day or so.  SYMPTOMS TO REPORT IMMEDIATELY:   Following lower endoscopy (colonoscopy or flexible sigmoidoscopy):  Excessive amounts of blood in the stool  Significant tenderness or worsening of abdominal pains  Swelling of the abdomen that is new, acute  Fever of 100F or higher   Following upper endoscopy (EGD)  Vomiting of blood or coffee ground material  New chest pain or pain under the  shoulder blades  Painful or persistently difficult swallowing  New shortness of breath  Fever of 100F or higher  Black, tarry-looking stools  For urgent or emergent issues, a gastroenterologist can be reached at any hour by calling [phone]. Do not use MyChart messaging for urgent concerns.    DIET:  We do recommend a small meal at first, but then you may proceed to your regular diet.  Drink plenty of fluids but you should avoid alcoholic beverages for 24 hours.  ACTIVITY:  You should plan to take it easy for the rest of today and you should NOT DRIVE or use heavy machinery until tomorrow (because of the sedation medicines used during the test).    FOLLOW UP: Our staff will call the number listed on your records 48-72 hours following your procedure to check on you and address any questions or concerns that you may have regarding the information given to you following your procedure. If we do not reach you, we will leave a message.  We will attempt to reach you two times.  During this call, we will ask if you have developed any symptoms of COVID 19. If you develop any symptoms (ie: fever, flu-like symptoms, shortness of breath, cough etc.) before then, please call [phone].  If you test positive for Covid 19 in the 2 weeks post procedure, please call and report this information to [place].    If any biopsies were taken you will be contacted by phone or by letter  within the next 1-3 weeks.  Please call us at [phone] if you have not heard about the biopsies in 3 weeks.    SIGNATURES/CONFIDENTIALITY: You and/or your care partner have signed paperwork which will be entered into your electronic medical record.  These signatures attest to the fact that that the information above on your After Visit Summary has been reviewed and is understood.  Full responsibility of the confidentiality of this discharge information lies with you and/or your care-partner.

## 2021-03-28 NOTE — Op Note (Signed)
[facility] Patient Name: Samuel Butler Procedure Date: 03/28/2021 1:57 PM MRN: [id number] Endoscopist: Ladene Artist , MD Age: 60 Referring MD:  Date of Birth: 04/04/1961 Gender: Male Account #: [id number] Procedure:                Colonoscopy Indications:              Clinically significant diarrhea of unexplained                            origin Medicines:                Monitored Anesthesia Care Procedure:                Pre-Anesthesia Assessment:                           - Prior to the procedure, a History and Physical                            was performed, and patient medications and                            allergies were reviewed. The patient's tolerance of                            previous anesthesia was also reviewed. The risks                            and benefits of the procedure and the sedation                            options and risks were discussed with the patient.                            All questions were answered, and informed consent                            was obtained. Prior Anticoagulants: The patient has                            taken Plavix (clopidogrel), last dose was 5 days                            prior, and Eliquis last dose was 2 days prior to                            procedure. ASA Grade Assessment: III - A patient                            with severe systemic disease. After reviewing the                            risks and benefits, the patient was deemed in  satisfactory condition to undergo the procedure.                           After obtaining informed consent, the colonoscope                            was passed under direct vision. Throughout the                            procedure, the patient's blood pressure, pulse, and                            oxygen saturations were monitored continuously. The                            Olympus CF-HQ190L [id number]) Colonoscope was                             introduced through the anus and advanced to the the                            cecum, identified by appendiceal orifice and                            ileocecal valve. The ileocecal valve, appendiceal                            orifice, and rectum were photographed. The quality                            of the bowel preparation was good. The colonoscopy                            was performed without difficulty. Looping                            interfered with intubation of the TI. The patient                            tolerated the procedure well. Scope In: 2:01:38 PM Scope Out: 2:14:31 PM Scope Withdrawal Time: 0 hours 9 minutes 33 seconds  Total Procedure Duration: 0 hours 12 minutes 53 seconds  Findings:                 The perianal and digital rectal examinations were                            normal. Random biopsies with a cold forceps for                            histology.                           An area of mildly congested mucosa and patchy  erythema was found in the rectum and in the sigmoid                            colon. This was biopsied with a cold forceps for                            histology.                           Internal hemorrhoids were found during                            retroflexion. The hemorrhoids were moderate and                            Grade I (internal hemorrhoids that do not prolapse).                           The exam was otherwise without abnormality on                            direct and retroflexion views. Complications:            No immediate complications. Estimated blood loss:                            None. Estimated Blood Loss:     Estimated blood loss: none. Impression:               - Congested mucosa and patchy erythema in the                            rectum and in the sigmoid colon. Biopsied.                           - Internal hemorrhoids.                            - The examination was otherwise normal on direct                            and retroflexion views. Random biopsies. Recommendation:           - Repeat colonoscopy after studies are complete for                            surveillance based on pathology results.                           - Resume Plavix (clopidogrel) in 2 days at prior                            dose and resume Eliquis in 2 days at prior dose.                            Refer to managing  physician for further adjustment                            of therapy.                           - Patient has a contact number available for                            emergencies. The signs and symptoms of potential                            delayed complications were discussed with the                            patient. Return to normal activities tomorrow.                            Written discharge instructions were provided to the                            patient.                           - Resume previous diet.                           - Continue present medications.                           - Await pathology results. Ladene Artist, MD 03/28/2021 2:29:24 PM This report has been signed electronically.

## 2021-03-28 NOTE — Progress Notes (Signed)
Pt. Reports lip feels swollen, as soon as he awoke from sedation.  Pt.'s wife says she doesn't note any swelling.  Lower lip does have mild erythema, and a dark discolored circular  area - 1/3" diameter to the right of the midline.  No break in skin integrity noted.  Inner Upper lip to the right of the midline upon inspection has small break in integrity (less than 1/8").  No bleeding, swelling, or discoloration noted on or on underside of upper lip.

## 2021-03-28 NOTE — Progress Notes (Signed)
Pt Drowsy. VSS. To PACU, report to RN. No anesthetic complications noted.  

## 2021-03-29 ENCOUNTER — Other Ambulatory Visit: Payer: Self-pay

## 2021-03-29 ENCOUNTER — Ambulatory Visit: Payer: BC Managed Care – PPO | Admitting: Physical Therapy

## 2021-03-29 DIAGNOSIS — R262 Difficulty in walking, not elsewhere classified: Secondary | ICD-10-CM

## 2021-03-29 DIAGNOSIS — M6281 Muscle weakness (generalized): Secondary | ICD-10-CM | POA: Diagnosis not present

## 2021-03-29 DIAGNOSIS — R5381 Other malaise: Secondary | ICD-10-CM

## 2021-03-29 NOTE — Therapy (Signed)
[facility] [address] [place], [place], [address] Phone: [phone]   Fax:  [phone]  Physical Therapy Treatment  Patient Details  Name: Samuel Butler MRN: [id number] Date of Birth: 1961-02-07 Referring Provider (PT): Isaac Bliss, Rayford Halsted, MD   Encounter Date: 03/29/2021   PT End of Session - 03/29/21 1451    Visit Number 12    Date for PT Re-Evaluation 06/11/21    Authorization Type BCBS    PT Start Time 1148    PT Stop Time 1226    PT Time Calculation (min) 38 min    Activity Tolerance Patient tolerated treatment well    Behavior During Therapy [facility] for tasks assessed/performed           Past Medical History:  Diagnosis Date  . DDD (degenerative disc disease), lumbar    per his report  . Diabetes mellitus without complication ([place])   . Gout 04/16/2013  . Hyperlipidemia   . Hypertension   . IBD (inflammatory bowel disease) 04/05/2016  . Pulmonary embolism ([place])   . Tobacco use disorder 03/28/2015    Past Surgical History:  Procedure Laterality Date  . ABDOMINAL AORTOGRAM W/LOWER EXTREMITY Right 06/26/2020   Procedure: ABDOMINAL AORTOGRAM W/LOWER EXTREMITY;  Surgeon: Lorretta Harp, MD;  Location: [facility];  Service: Cardiovascular;  Laterality: Right;  . COLONOSCOPY    . PERIPHERAL VASCULAR INTERVENTION  06/26/2020   Procedure: PERIPHERAL VASCULAR INTERVENTION;  Surgeon: Lorretta Harp, MD;  Location: [facility];  Service: Cardiovascular;;  Right SFA  . TONSILLECTOMY      There were no vitals filed for this visit.   Subjective Assessment - 03/29/21 1151    Subjective Has Rt hip and Lt shoulder pain.    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 5-10 minutes and then I feel SOB and pain    Patient Stated  Goals be able to get up and down large steps and off the floor; be less short of breath    Currently in Pain? 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; bilateral UE carry x1; Lt/Rt unilateral lift and carry x1      Knee/Hip Exercises: Aerobic   Other Aerobic UBE Level 1: 2 minutes forward/back - PT present to discuss progress      Knee/Hip Exercises: Machines for Strengthening   Cybex Leg Press 65# seat 8  2x10, Rt LE 30# Rt and Lt 2x10      Knee/Hip Exercises: Seated   Ball Squeeze x10 repetition  x 3 second hold    Other Seated Knee/Hip Exercises seated hip abduction isometrics; 2 x 10 repetitions x 3 second hold      Shoulder Exercises: Seated   External Rotation Weight (lbs) 2 x 10 repetitions; green loop at wrists    Other Seated Exercises biceps curls 4# 2x10      Shoulder Exercises: Standing   Extension Both;10 reps    Theraband Level (Shoulder Extension) Level 3 (Green)    Row Both;10 reps    Theraband Level (Shoulder Row) Level 3 (Green)                    PT Short Term Goals - 02/08/21 1248      PT SHORT TERM GOAL #1   Title 5 x sit to stand in 20 seconds or less    Baseline 14.09    Status Achieved      PT SHORT TERM GOAL #2   Title Pt will be able to go up and down 4 steps reciprocally 3 x using one handrail due to improved strength    Baseline ascending with 1 rail.  Descdending pt demonstrated instability and required both rails    Status  On-going             PT Long Term Goals - 03/29/21 1450      PT LONG TERM GOAL #5   Title Pt will be able to lift at least 25lb from mat table with good mechanics and carry 20 ft for return to work activities.    Time 12    Period Weeks    Status On-going   Able to lift 15lbs from mat table and carry 10 feet                Plan - 03/29/21 1447    Clinical Impression Statement Patient reporting decreased Lt shoulder pain at end of session. Tactile cues provided for upright trunk when performing seated shoulder exercises. Patient requiring UE assist to raise Rt LE to leg press. Would benefit from continued skilled intervention to address impairments for improved activity tolerance and to return to work duties.    Examination-Activity Limitations Lift;Transfers;Sit;Squat    Examination-Participation Restrictions Occupation;Community Activity;Driving    Rehab Potential Good    PT Frequency 2x / week    PT Duration 12 weeks    PT Treatment/Interventions ADLs/Self Care Home Management;Cryotherapy;Electrical Stimulation;Moist Heat;Traction;Gait Scientist, forensic;Therapeutic activities;Therapeutic exercise;Balance training;Neuromuscular re-education;Patient/family education;Manual techniques;Taping    PT Next Visit Plan balance, strength, functional training to patient tolerance    PT Home Exercise Plan Access Code: [email or web address]    Consulted and Agree with Plan of Care Patient           Patient will benefit from skilled therapeutic intervention in order to improve the following deficits and impairments:  Pain,Postural dysfunction,Decreased strength,Difficulty walking,Decreased balance,Abnormal gait,Decreased endurance  Visit Diagnosis: Muscle weakness (generalized)  Difficulty in walking, not elsewhere classified  Physical deconditioning     Problem List Patient Active Problem List   Diagnosis Date Noted  . Coronary artery disease 03/13/2021  . Acute pulmonary  embolism ([place]) 10/18/2020  . Groin pain, right 08/31/2020  . Chronic viral hepatitis B without delta-agent ([place]) 08/21/2020  . Amyloidosis ([place]) 08/21/2020  . Anasarca 08/04/2020  . Nephrotic syndrome 08/03/2020  . AKI (acute kidney injury) ([place]) 08/03/2020  . Hypokalemia 06/27/2020  . Claudication in peripheral vascular disease ([place]) 06/26/2020  . Hypertension associated with diabetes ([place]) 05/25/2020  . Peripheral arterial  disease ([place]) 03/08/2020  . Vitamin D deficiency 10/26/2019  . Hypoalbuminemia 10/26/2019  . IBD (inflammatory bowel disease) 04/05/2016  . Tobacco use disorder 03/28/2015  . Non-insulin treated type 2 diabetes mellitus ([place]) 01/25/2014  . Essential hypertension, benign 04/16/2013  . Hyperlipemia 04/16/2013  . Gout 04/16/2013    Everardo All PT, DPT  03/29/21 2:58 PM    [facility] [address], [place], [place], [address] Phone: [phone]   Fax:  [phone]  Name: Linsey Hirota Flagg MRN: [id number] Date of Birth: 1961-04-11

## 2021-03-30 ENCOUNTER — Inpatient Hospital Stay: Payer: BC Managed Care – PPO | Admitting: Dietician

## 2021-03-30 ENCOUNTER — Telehealth: Payer: Self-pay | Admitting: *Deleted

## 2021-03-30 NOTE — Progress Notes (Signed)
Nutrition  Patient scheduled for nutrition follow-up via telephone. Briefly spoke with patient at scheduled time. He reports doing well after EGD and is awaiting biopsy results. Patient reports he is outside and has a snake in his yard, he is calling out to his neighbor asking him to look at the snake.  Patient appreciative of phone call, reports he is doing good, but "needs to go and take care of this snake." Nutrition follow-up scheduled via telephone Tuesday, May 31

## 2021-03-30 NOTE — Telephone Encounter (Signed)
Follow up phone call made/ 

## 2021-03-30 NOTE — Telephone Encounter (Signed)
  Follow up Call-  Call back number 03/28/2021  Post procedure Call Back phone  # [phone]  Permission to leave phone message Yes  Some recent data might be hidden     Patient questions:  Do you have a fever, pain , or abdominal swelling? No. Pain Score  0 *  Have you tolerated food without any problems? Yes.    Have you been able to return to your normal activities? Yes.    Do you have any questions about your discharge instructions: Diet   No. Medications  No. Follow up visit  No.  Do you have questions or concerns about your Care? No.  Actions: * If pain score is 4 or above: No action needed, pain <4.  1. Have you developed a fever since your procedure? no  2.   Have you had an respiratory symptoms (SOB or cough) since your procedure? no  3.   Have you tested positive for COVID 19 since your procedure no  4.   Have you had any family members/close contacts diagnosed with the COVID 19 since your procedure?  no   If yes to any of these questions please route to Joylene John, RN and Joella Prince, RN

## 2021-04-01 ENCOUNTER — Other Ambulatory Visit: Payer: Self-pay | Admitting: Internal Medicine

## 2021-04-02 ENCOUNTER — Ambulatory Visit (HOSPITAL_COMMUNITY)
Admission: RE | Admit: 2021-04-02 | Discharge: 2021-04-02 | Disposition: A | Discharge status: Home / Self Care | Payer: BC Managed Care – PPO | Source: Ambulatory Visit | Attending: Cardiology | Admitting: Cardiology

## 2021-04-02 ENCOUNTER — Other Ambulatory Visit: Payer: Self-pay

## 2021-04-02 ENCOUNTER — Other Ambulatory Visit (HOSPITAL_COMMUNITY): Payer: Self-pay | Admitting: Cardiovascular Disease

## 2021-04-02 DIAGNOSIS — I739 Peripheral vascular disease, unspecified: Secondary | ICD-10-CM | POA: Diagnosis present

## 2021-04-02 DIAGNOSIS — Z95820 Peripheral vascular angioplasty status with implants and grafts: Secondary | ICD-10-CM

## 2021-04-03 ENCOUNTER — Ambulatory Visit: Payer: BC Managed Care – PPO

## 2021-04-03 ENCOUNTER — Other Ambulatory Visit: Payer: Self-pay

## 2021-04-03 DIAGNOSIS — M6281 Muscle weakness (generalized): Secondary | ICD-10-CM

## 2021-04-03 DIAGNOSIS — R5381 Other malaise: Secondary | ICD-10-CM

## 2021-04-03 DIAGNOSIS — R262 Difficulty in walking, not elsewhere classified: Secondary | ICD-10-CM

## 2021-04-03 NOTE — Therapy (Signed)
[facility] [address], [place], [place], [address] Phone: [phone]   Fax:  [phone]  Physical Therapy Treatment  Patient Details  Name: Samuel Butler MRN: [id number] Date of Birth: Nov 28, 1960 Referring Provider (PT): Isaac Bliss, Rayford Halsted, MD   Encounter Date: 04/03/2021   PT End of Session - 04/03/21 1228    Visit Number 13    Date for PT Re-Evaluation 06/11/21    Authorization Type BCBS    PT Start Time 1147    PT Stop Time 1227    PT Time Calculation (min) 40 min    Activity Tolerance Patient tolerated treatment well    Behavior During Therapy [facility] for tasks assessed/performed           Past Medical History:  Diagnosis Date  . DDD (degenerative disc disease), lumbar    per his report  . Diabetes mellitus without complication ([place])   . Gout 04/16/2013  . Hyperlipidemia   . Hypertension   . IBD (inflammatory bowel disease) 04/05/2016  . Pulmonary embolism ([place])   . Tobacco use disorder 03/28/2015    Past Surgical History:  Procedure Laterality Date  . ABDOMINAL AORTOGRAM W/LOWER EXTREMITY Right 06/26/2020   Procedure: ABDOMINAL AORTOGRAM W/LOWER EXTREMITY;  Surgeon: Lorretta Harp, MD;  Location: [place] [facility];  Service: Cardiovascular;  Laterality: Right;  . COLONOSCOPY    . PERIPHERAL VASCULAR INTERVENTION  06/26/2020   Procedure: PERIPHERAL VASCULAR INTERVENTION;  Surgeon: Lorretta Harp, MD;  Location: [place] [facility];  Service: Cardiovascular;;  Right SFA  . TONSILLECTOMY      There were no vitals filed for this visit.   Subjective Assessment - 04/03/21 1154    Subjective I'm doing well.  I felt like I had worked after my last session but it wasn't too much.    Currently in Pain? 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  bilateral UE carry- 2x30 ft, 2 reps of this      Knee/Hip Exercises: Aerobic   Other Aerobic Arm bike: Level 1.5 x 4 minutes (2/2)      Knee/Hip Exercises: Machines for Strengthening   Cybex Leg Press 65# seat 8  2x10, Rt LE 30# Rt and Lt 2x10      Knee/Hip Exercises: Seated   Ball Squeeze x10 repetition x 3 second hold    Sit to Sand 2 sets;10 reps   pad in seat     Shoulder Exercises: Seated   External Rotation Weight (lbs) 2 x 10 repetitions; green loop at wrists    Flexion Weight (lbs) 2    Flexion Limitations 3 way raises with 2# 2x10    Other Seated Exercises biceps curls 4# 2x10      Shoulder Exercises: Standing   Extension Both;20 reps;Theraband    Theraband Level (Shoulder Extension) Level 3 (Green)    Row Both;20 reps    Theraband Level (Shoulder Row) Level 3 Nyoka Cowden)                    PT Short Term Goals - 02/08/21 1248      PT SHORT  TERM GOAL #1   Title 5 x sit to stand in 20 seconds or less    Baseline 14.09    Status Achieved      PT SHORT TERM GOAL #2   Title Pt will be able to go up and down 4 steps reciprocally 3 x using one handrail due to improved strength    Baseline ascending with 1 rail.  Descdending pt demonstrated instability and required both rails    Status On-going             PT Long Term Goals - 03/29/21 1450      PT LONG TERM GOAL #5   Title Pt will be able to lift at least 25lb from mat table with good mechanics and carry 20 ft for return to work activities.    Time 12    Period Weeks    Status On-going   Able to lift 15lbs from mat table and carry 10 feet                Plan - 04/03/21 1158    Clinical Impression Statement Pt continues to receive treatments 1x/wk and he is therefore fatigued and has to modify his activity based on this.  Pt is going to transition to treatments 1x/month starting in June.  Pt tolerates current level of exercise  well and PT is present to monitor for fatigue and technique.  Patient requires some UE assist to raise Rt LE to leg press. Would benefit from continued skilled intervention to address impairments for improved activity tolerance and to return to work duties.    PT Frequency 2x / week    PT Duration 12 weeks    PT Treatment/Interventions ADLs/Self Care Home Management;Cryotherapy;Electrical Stimulation;Moist Heat;Traction;Gait Scientist, forensic;Therapeutic activities;Therapeutic exercise;Balance training;Neuromuscular re-education;Patient/family education;Manual techniques;Taping    PT Next Visit Plan balance, strength, functional training to patient tolerance    PT Home Exercise Plan Access Code: [email or web address]    Consulted and Agree with Plan of Care Patient           Patient will benefit from skilled therapeutic intervention in order to improve the following deficits and impairments:  Pain,Postural dysfunction,Decreased strength,Difficulty walking,Decreased balance,Abnormal gait,Decreased endurance  Visit Diagnosis: Muscle weakness (generalized)  Difficulty in walking, not elsewhere classified  Physical deconditioning     Problem List Patient Active Problem List   Diagnosis Date Noted  . Coronary artery disease 03/13/2021  . Acute pulmonary embolism ([place]) 10/18/2020  . Groin pain, right 08/31/2020  . Chronic viral hepatitis B without delta-agent ([place]) 08/21/2020  . Amyloidosis ([place]) 08/21/2020  . Anasarca 08/04/2020  . Nephrotic syndrome 08/03/2020  . AKI (acute kidney injury) ([place]) 08/03/2020  . Hypokalemia 06/27/2020  . Claudication in peripheral vascular disease ([place]) 06/26/2020  . Hypertension associated with diabetes ([place]) 05/25/2020  . Peripheral arterial disease ([place]) 03/08/2020  . Vitamin D deficiency 10/26/2019  . Hypoalbuminemia 10/26/2019  . IBD (inflammatory bowel disease) 04/05/2016  . Tobacco use disorder 03/28/2015  . Non-insulin treated type 2 diabetes  mellitus ([place]) 01/25/2014  . Essential hypertension, benign 04/16/2013  . Hyperlipemia 04/16/2013  . Gout 04/16/2013    Sigurd Sos, PT 04/03/21 12:29 PM  [facility] [address] [place], [place], [address] Phone: [phone]   Fax:  [phone]  Name: Reshawn Ostlund Meech MRN: [id number] Date of Birth: 23-Dec-1960

## 2021-04-05 ENCOUNTER — Ambulatory Visit: Payer: BC Managed Care – PPO

## 2021-04-05 ENCOUNTER — Other Ambulatory Visit: Payer: Self-pay

## 2021-04-05 DIAGNOSIS — R262 Difficulty in walking, not elsewhere classified: Secondary | ICD-10-CM

## 2021-04-05 DIAGNOSIS — M6281 Muscle weakness (generalized): Secondary | ICD-10-CM | POA: Diagnosis not present

## 2021-04-05 NOTE — Therapy (Signed)
[facility] [address], [place], [place], [address] Phone: [phone]   Fax:  [phone]  Physical Therapy Treatment  Patient Details  Name: Summer Parthasarathy Oros MRN: [id number] Date of Birth: [birth year] Referring Provider (PT): Isaac Bliss, Rayford Halsted, MD   Encounter Date: 04/05/2021   PT End of Session - 04/05/21 1220    Visit Number 14    Date for PT Re-Evaluation 06/11/21    Authorization Type BCBS    PT Start Time 1149    PT Stop Time 1228    PT Time Calculation (min) 39 min    Activity Tolerance Patient tolerated treatment well    Behavior During Therapy [facility] for tasks assessed/performed           Past Medical History:  Diagnosis Date  . DDD (degenerative disc disease), lumbar    per his report  . Diabetes mellitus without complication ([place])   . Gout 04/16/2013  . Hyperlipidemia   . Hypertension   . IBD (inflammatory bowel disease) 04/05/2016  . Pulmonary embolism ([place])   . Tobacco use disorder 03/28/2015    Past Surgical History:  Procedure Laterality Date  . ABDOMINAL AORTOGRAM W/LOWER EXTREMITY Right 06/26/2020   Procedure: ABDOMINAL AORTOGRAM W/LOWER EXTREMITY;  Surgeon: Lorretta Harp, MD;  Location: [place] [facility];  Service: Cardiovascular;  Laterality: Right;  . COLONOSCOPY    . PERIPHERAL VASCULAR INTERVENTION  06/26/2020   Procedure: PERIPHERAL VASCULAR INTERVENTION;  Surgeon: Lorretta Harp, MD;  Location: [place] [facility];  Service: Cardiovascular;;  Right SFA  . TONSILLECTOMY      There were no vitals filed for this visit.   Subjective Assessment - 04/05/21 1206    Subjective I didn't feel too tired after last session.  I do feel like I worked.    Currently in Pain? 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  bilateral UE carry- 2x30 ft, 2 reps of this      Knee/Hip Exercises: Aerobic   Nustep L2 x 8 minutes- PT present to monitor    Other Aerobic Arm bike: Level 1.5 x 4 minutes (2/2)      Knee/Hip Exercises: Machines for Strengthening   Cybex Leg Press 65# seat 8  2x10, Rt LE 30# Rt and Lt 2x10      Knee/Hip Exercises: Seated   Ball Squeeze x10 repetition x 3 second hold    Sit to Sand 2 sets;10 reps   pad in seat     Shoulder Exercises: Seated   External Rotation Weight (lbs) 2 x 10 repetitions; green loop at wrists    Flexion Weight (lbs) 2    Flexion Limitations 3 way raises with 2# 2x10    Other Seated Exercises biceps curls 4# 2x10      Shoulder Exercises: Standing   Extension Both;20 reps;Theraband    Theraband Level (Shoulder Extension) Level 3 (Green)    Extension Limitations standing on black pad    Row Both;20 reps    Theraband Level (Shoulder Row) Level 3 (Green)    Row Limitations standing on black pad  PT Short Term Goals - 02/08/21 1248      PT SHORT TERM GOAL #1   Title 5 x sit to stand in 20 seconds or less    Baseline 14.09    Status Achieved      PT SHORT TERM GOAL #2   Title Pt will be able to go up and down 4 steps reciprocally 3 x using one handrail due to improved strength    Baseline ascending with 1 rail.  Descdending pt demonstrated instability and required both rails    Status On-going             PT Long Term Goals - 03/29/21 1450      PT LONG TERM GOAL #5   Title Pt will be able to lift at least 25lb from mat table with good mechanics and carry 20 ft for return to work activities.    Time 12    Period Weeks    Status On-going   Able to lift 15lbs from mat table and carry 10 feet                Plan - 04/05/21 1224    Clinical Impression Statement Pt continues to receive treatments 1x/wk and he is therefore fatigued and has to modify his activity based on this.  Pt is going to transition to  treatments 1x/month starting in June.  Pt continues to be challenged with current level of exercise and PT is present to monitor for fatigue and technique.  Pt does not need as much time between exercises to recover and PT alternated between UE and LE exercise to maximize treatment time and minimize fatigue. Patient requires some UE assist to raise Rt LE to leg press. Pt will benefit from continued skilled intervention to address impairments for improved activity tolerance and to return to work duties.    PT Frequency 2x / week    PT Duration 12 weeks    PT Treatment/Interventions ADLs/Self Care Home Management;Cryotherapy;Electrical Stimulation;Moist Heat;Traction;Gait Scientist, forensic;Therapeutic activities;Therapeutic exercise;Balance training;Neuromuscular re-education;Patient/family education;Manual techniques;Taping    PT Next Visit Plan balance, strength, functional training to patient tolerance    PT Home Exercise Plan Access Code: [email or web address]    Recommended Other Services recert is signed    Consulted and Agree with Plan of Care Patient           Patient will benefit from skilled therapeutic intervention in order to improve the following deficits and impairments:  Pain,Postural dysfunction,Decreased strength,Difficulty walking,Decreased balance,Abnormal gait,Decreased endurance  Visit Diagnosis: Muscle weakness (generalized)  Difficulty in walking, not elsewhere classified     Problem List Patient Active Problem List   Diagnosis Date Noted  . Coronary artery disease 03/13/2021  . Acute pulmonary embolism ([place]) 10/18/2020  . Groin pain, right 08/31/2020  . Chronic viral hepatitis B without delta-agent ([place]) 08/21/2020  . Amyloidosis ([place]) 08/21/2020  . Anasarca 08/04/2020  . Nephrotic syndrome 08/03/2020  . AKI (acute kidney injury) ([place]) 08/03/2020  . Hypokalemia 06/27/2020  . Claudication in peripheral vascular disease ([place]) 06/26/2020  . Hypertension associated with  diabetes ([place]) 05/25/2020  . Peripheral arterial disease ([place]) 03/08/2020  . Vitamin D deficiency 10/26/2019  . Hypoalbuminemia 10/26/2019  . IBD (inflammatory bowel disease) 04/05/2016  . Tobacco use disorder 03/28/2015  . Non-insulin treated type 2 diabetes mellitus ([place]) 01/25/2014  . Essential hypertension, benign 04/16/2013  . Hyperlipemia 04/16/2013  . Gout 04/16/2013    Sigurd Sos, PT 04/05/21 12:25 PM   [facility]  [place] [address] [place], [place], [address] Phone: [phone]   Fax:  [phone]  Name: Anup Brigham Cremer MRN: [id number] Date of Birth: 1961/01/13

## 2021-04-06 ENCOUNTER — Encounter: Payer: Self-pay | Admitting: Gastroenterology

## 2021-04-10 ENCOUNTER — Inpatient Hospital Stay: Payer: BC Managed Care – PPO | Admitting: Dietician

## 2021-04-10 ENCOUNTER — Ambulatory Visit: Payer: BC Managed Care – PPO | Admitting: Physical Therapy

## 2021-04-10 ENCOUNTER — Other Ambulatory Visit: Payer: Self-pay

## 2021-04-10 DIAGNOSIS — M6281 Muscle weakness (generalized): Secondary | ICD-10-CM

## 2021-04-10 DIAGNOSIS — R5381 Other malaise: Secondary | ICD-10-CM

## 2021-04-10 DIAGNOSIS — R262 Difficulty in walking, not elsewhere classified: Secondary | ICD-10-CM

## 2021-04-10 NOTE — Progress Notes (Signed)
Nutrition  Attempted to contact patient via telephone for nutrition follow-up. Patient did not answer. Message left on voicemail with request for return call. Contact information provided.

## 2021-04-10 NOTE — Therapy (Signed)
[facility] [address] [place], [place], [address] Phone: [phone]   Fax:  [phone]  Physical Therapy Treatment  Patient Details  Name: Samuel Butler MRN: [id number] Date of Birth: [birth year] Referring Provider (PT): Isaac Bliss, Rayford Halsted, MD   Encounter Date: 04/10/2021   PT End of Session - 04/10/21 1442    Visit Number 15    Date for PT Re-Evaluation 06/11/21    Authorization Type BCBS    PT Start Time 1400    PT Stop Time 1439    PT Time Calculation (min) 39 min    Activity Tolerance Patient tolerated treatment well    Behavior During Therapy [facility] for tasks assessed/performed           Past Medical History:  Diagnosis Date  . DDD (degenerative disc disease), lumbar    per his report  . Diabetes mellitus without complication ([place])   . Gout 04/16/2013  . Hyperlipidemia   . Hypertension   . IBD (inflammatory bowel disease) 04/05/2016  . Pulmonary embolism ([place])   . Tobacco use disorder 03/28/2015    Past Surgical History:  Procedure Laterality Date  . ABDOMINAL AORTOGRAM W/LOWER EXTREMITY Right 06/26/2020   Procedure: ABDOMINAL AORTOGRAM W/LOWER EXTREMITY;  Surgeon: Lorretta Harp, MD;  Location: [place] [facility];  Service: Cardiovascular;  Laterality: Right;  . COLONOSCOPY    . PERIPHERAL VASCULAR INTERVENTION  06/26/2020   Procedure: PERIPHERAL VASCULAR INTERVENTION;  Surgeon: Lorretta Harp, MD;  Location: [place] [facility];  Service: Cardiovascular;;  Right SFA  . TONSILLECTOMY      There were no vitals filed for this visit.   Subjective Assessment - 04/10/21 1428    Subjective Has increased stiffness this date.    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 5-10 minutes and then I feel SOB and pain    Patient Stated  Goals be able to get up and down large steps and off the floor; be less short of breath    Currently in Pain? 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[employer], 15lbs Lt/Rt, farmer carries 15lb/10lb, 2 x 30 feet      Knee/Hip Exercises: Aerobic   Nustep L2 x 8 minutes- PT present to monitor   LE only     Knee/Hip Exercises: Machines for Strengthening   Cybex Leg Press 70# seat 8  2x10, Rt LE 30# Rt and Lt 2x10      Shoulder Exercises: Seated   External Rotation Weight (lbs) 2 x 10 repetitions; green loop at wrists    Flexion Weight (lbs) 2    Flexion Limitations 3 way raises with 2# 2x10    Other Seated Exercises biceps curls 5# 2x10      Shoulder Exercises: Standing   Extension Both;20 reps;Theraband    Theraband Level (Shoulder Extension) Level 3 (Green)    Extension Limitations standing on black pad    Row Both;20 reps    Theraband Level (Shoulder Row) Level 3 (  Green)    Row Limitations standing on black pad                    PT Short Term Goals - 02/08/21 1248      PT SHORT TERM GOAL #1   Title 5 x sit to stand in 20 seconds or less    Baseline 14.09    Status Achieved      PT SHORT TERM GOAL #2   Title Pt will be able to go up and down 4 steps reciprocally 3 x using one handrail due to improved strength    Baseline ascending with 1 rail.  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  Able to be upright and functioning all day for light tasks.     Time 12    Period Weeks    Status On-going      PT LONG TERM GOAL #2   Title Be able to get up and down off the floor without assistance    Baseline min assist and UE support    Time 12    Period Weeks    Status On-going      PT LONG TERM GOAL #5   Title Pt will be able to lift at least 25lb from mat table with good mechanics and carry 20 ft for return to work activities.    Time 12    Period Weeks    Status --   able to lift 15lbs and carry 20 feet                Plan - 04/10/21 1428    Clinical Impression Statement Patient demonstrates improved LE and UE strength as he tolerated additional resistance to leg press and bicep curl exercise. Verbal cues provided for increased hip hinge during lift portion of lift and carry exercise. Would benefit from continued skilled intervention to address impairments for improved functional activity tolerance.    Examination-Activity Limitations Lift;Transfers;Sit;Squat    Examination-Participation Restrictions Occupation;Community Activity;Driving    Rehab Potential Good    PT Frequency 2x / week    PT Duration 12 weeks    PT Treatment/Interventions ADLs/Self Care Home Management;Cryotherapy;Electrical Stimulation;Moist Heat;Traction;Gait Scientist, forensic;Therapeutic activities;Therapeutic exercise;Balance training;Neuromuscular re-education;Patient/family education;Manual techniques;Taping    PT Next Visit Plan continue balance, strength, and functional training with progression to patient tolerance    PT Home Exercise Plan Access Code: [email or web address]    Consulted and Agree with Plan of Care Patient           Patient will benefit from skilled therapeutic intervention in order to improve the following deficits and impairments:  Pain,Postural dysfunction,Decreased strength,Difficulty walking,Decreased balance,Abnormal gait,Decreased endurance  Visit Diagnosis: Muscle weakness (generalized)  Difficulty in walking, not elsewhere  classified  Physical deconditioning     Problem List Patient Active Problem List   Diagnosis Date Noted  . Coronary artery disease 03/13/2021  . Acute pulmonary embolism ([place]) 10/18/2020  . Groin pain, right 08/31/2020  . Chronic viral hepatitis B without delta-agent ([place]) 08/21/2020  .  Amyloidosis ([place]) 08/21/2020  . Anasarca 08/04/2020  . Nephrotic syndrome 08/03/2020  . AKI (acute kidney injury) ([place]) 08/03/2020  . Hypokalemia 06/27/2020  . Claudication in peripheral vascular disease ([place]) 06/26/2020  . Hypertension associated with diabetes ([place]) 05/25/2020  . Peripheral arterial disease ([place]) 03/08/2020  . Vitamin D deficiency 10/26/2019  . Hypoalbuminemia 10/26/2019  . IBD (inflammatory bowel disease) 04/05/2016  . Tobacco use disorder 03/28/2015  . Non-insulin treated type 2 diabetes mellitus ([place]) 01/25/2014  . Essential hypertension, benign 04/16/2013  . Hyperlipemia 04/16/2013  . Gout 04/16/2013   Everardo All PT, DPT  04/10/21 2:43 PM    [facility] [address], [place], [place], [address] Phone: [phone]   Fax:  [phone]  Name: Samuel Butler MRN: [id number] Date of Birth: 1961-08-09

## 2021-04-12 ENCOUNTER — Other Ambulatory Visit: Payer: Self-pay

## 2021-04-12 ENCOUNTER — Ambulatory Visit: Payer: BC Managed Care – PPO | Attending: Internal Medicine | Admitting: Physical Therapy

## 2021-04-12 DIAGNOSIS — R262 Difficulty in walking, not elsewhere classified: Secondary | ICD-10-CM | POA: Insufficient documentation

## 2021-04-12 DIAGNOSIS — M6281 Muscle weakness (generalized): Secondary | ICD-10-CM | POA: Diagnosis not present

## 2021-04-12 DIAGNOSIS — R5381 Other malaise: Secondary | ICD-10-CM | POA: Diagnosis present

## 2021-04-12 NOTE — Therapy (Signed)
[facility] [address] [place], [place], [address] Phone: [phone]   Fax:  [phone]  Physical Therapy Treatment  Patient Details  Name: Samuel Butler MRN: [id number] Date of Birth: Sep 03, 1961 Referring Provider (PT): Isaac Bliss, Rayford Halsted, MD   Encounter Date: 04/12/2021   PT End of Session - 04/12/21 1500    Visit Number 16    Date for PT Re-Evaluation 06/11/21    Authorization Type BCBS    PT Start Time 1231    PT Stop Time 1312    PT Time Calculation (min) 41 min    Activity Tolerance Patient tolerated treatment well    Behavior During Therapy [facility] for tasks assessed/performed           Past Medical History:  Diagnosis Date  . DDD (degenerative disc disease), lumbar    per his report  . Diabetes mellitus without complication ([place])   . Gout 04/16/2013  . Hyperlipidemia   . Hypertension   . IBD (inflammatory bowel disease) 04/05/2016  . Pulmonary embolism ([place])   . Tobacco use disorder 03/28/2015    Past Surgical History:  Procedure Laterality Date  . ABDOMINAL AORTOGRAM W/LOWER EXTREMITY Right 06/26/2020   Procedure: ABDOMINAL AORTOGRAM W/LOWER EXTREMITY;  Surgeon: Lorretta Harp, MD;  Location: [place] [facility];  Service: Cardiovascular;  Laterality: Right;  . COLONOSCOPY    . PERIPHERAL VASCULAR INTERVENTION  06/26/2020   Procedure: PERIPHERAL VASCULAR INTERVENTION;  Surgeon: Lorretta Harp, MD;  Location: [place] [facility];  Service: Cardiovascular;;  Right SFA  . TONSILLECTOMY      There were no vitals filed for this visit.   Subjective Assessment - 04/12/21 1457    Subjective Just has stiffness today but less than previous session.    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 5-10 minutes and then I feel SOB and  pain    Patient Stated Goals be able to get up and down large steps and off the floor; be less short of breath    Currently in Pain? 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      Knee/Hip Exercises: Aerobic   Nustep L2 x 8 minutes- PT present to monitor    Other Aerobic Arm bike: Level 1.5 x 4 minutes (2/2)      Knee/Hip Exercises: Machines for Strengthening   Cybex Leg Press 70# seat 7  x20, single LE 35# Rt and Lt 2x10      Knee/Hip Exercises: Standing   Other Standing Knee Exercises partial dead lift; 2 5lb weights; x10 repetitions      Knee/Hip Exercises: Seated   Sit to Sand 2 sets;10 reps;without UE support   pad on mat table; x10; x10 with 5lbs     Shoulder Exercises: Seated   External Rotation Weight (lbs) 2 x 10 repetitions; yellow tband; palms up    Flexion Weight (lbs) 2    Flexion Limitations 3 way raises with 2# x12 reps each    Other Seated Exercises biceps curls  5# 2x10                    PT Short Term Goals - 04/12/21 1500      PT SHORT TERM GOAL #1   Title 5 x sit to stand in 20 seconds or less    Baseline 14.09    Time 6    Period Weeks    Status Achieved    Target Date 02/06/21      PT SHORT TERM GOAL #2   Title Pt will be able to go up and down 4 steps reciprocally 3 x using one handrail due to improved strength    Baseline ascending with 1 rail.  Descdending pt demonstrated instability and required both rails    Time 6    Period Weeks    Status On-going    Target Date 02/06/21      PT SHORT TERM GOAL #3   Title Pt will perform 6 min walk test for baseline to create long term goal    Baseline 1183 ft    Time 1    Period Weeks    Status Achieved    Target Date 01/02/21             PT Long Term Goals - 04/12/21 1459      PT LONG TERM GOAL #5   Title Pt will  be able to lift at least 25lb from mat table with good mechanics and carry 20 ft for return to work activities.    Time 12    Period Weeks    Status On-going   20 lbs this date                Plan - 04/12/21 1458    Clinical Impression Statement Patient demonstrates improved LE strength as he tolerated increased resistance when performing leg press exercise and sit to stand activity. Verbal cues for increased activation of scapular retractors when performing partial dead lift exercise indicating need for continued postural strengthening. Would benefit from continued skilled intervention to address impairments for decreased fall risk.    Examination-Activity Limitations Lift;Transfers;Sit;Squat    Examination-Participation Restrictions Occupation;Community Activity;Driving    Rehab Potential Good    PT Frequency 2x / week    PT Duration 12 weeks    PT Treatment/Interventions ADLs/Self Care Home Management;Cryotherapy;Electrical Stimulation;Moist Heat;Traction;Gait Scientist, forensic;Therapeutic activities;Therapeutic exercise;Balance training;Neuromuscular re-education;Patient/family education;Manual techniques;Taping    PT Next Visit Plan progress functional strengthening to patient tolerance; postural strengthening as well    PT Home Exercise Plan Access Code: [email or web address]    Consulted and Agree with Plan of Care Patient           Patient will benefit from skilled therapeutic intervention in order to improve the following deficits and impairments:  Pain,Postural dysfunction,Decreased strength,Difficulty walking,Decreased balance,Abnormal gait,Decreased endurance  Visit Diagnosis: Muscle weakness (generalized)  Difficulty in walking, not elsewhere classified  Physical deconditioning     Problem List Patient Active Problem List   Diagnosis Date Noted  . Coronary artery disease 03/13/2021  . Acute pulmonary embolism ([place]) 10/18/2020  . Groin pain, right 08/31/2020  .  Chronic viral hepatitis B without delta-agent ([place]) 08/21/2020  . Amyloidosis ([place]) 08/21/2020  . Anasarca 08/04/2020  . Nephrotic syndrome 08/03/2020  . AKI (acute kidney injury) ([place]) 08/03/2020  . Hypokalemia 06/27/2020  . Claudication in peripheral vascular disease ([place]) 06/26/2020  . Hypertension associated with diabetes ([place]) 05/25/2020  . Peripheral arterial disease ([place]) 03/08/2020  . Vitamin D deficiency  10/26/2019  . Hypoalbuminemia 10/26/2019  . IBD (inflammatory bowel disease) 04/05/2016  . Tobacco use disorder 03/28/2015  . Non-insulin treated type 2 diabetes mellitus ([place]) 01/25/2014  . Essential hypertension, benign 04/16/2013  . Hyperlipemia 04/16/2013  . Gout 04/16/2013   Everardo All PT, DPT  04/12/21 3:17 PM   [facility] [address] [place], [place], [address] Phone: [phone]   Fax:  [phone]  Name: Samuel Butler MRN: [id number] Date of Birth: 1961-07-12

## 2021-04-17 ENCOUNTER — Ambulatory Visit: Payer: BC Managed Care – PPO | Admitting: Physical Therapy

## 2021-04-17 ENCOUNTER — Other Ambulatory Visit: Payer: Self-pay

## 2021-04-17 DIAGNOSIS — R5381 Other malaise: Secondary | ICD-10-CM

## 2021-04-17 DIAGNOSIS — R262 Difficulty in walking, not elsewhere classified: Secondary | ICD-10-CM

## 2021-04-17 DIAGNOSIS — M6281 Muscle weakness (generalized): Secondary | ICD-10-CM

## 2021-04-17 NOTE — Therapy (Addendum)
[facility] [address] [place], [place], [address] Phone: [phone]   Fax:  [phone]  Physical Therapy Treatment  Patient Details  Name: Samuel Butler MRN: [id number] Date of Birth: [birth year] Referring Provider (PT): Isaac Bliss, Rayford Halsted, MD   Encounter Date: 04/17/2021   PT End of Session - 04/17/21 1551     Visit Number 17    Date for PT Re-Evaluation 06/11/21    Authorization Type BCBS    PT Start Time 1147    PT Stop Time 1228    PT Time Calculation (min) 41 min    Activity Tolerance Patient tolerated treatment well;Patient limited by pain    Behavior During Therapy [facility] for tasks assessed/performed             Past Medical History:  Diagnosis Date   DDD (degenerative disc disease), lumbar    per his report   Diabetes mellitus without complication ([place])    Gout 04/16/2013   Hyperlipidemia    Hypertension    IBD (inflammatory bowel disease) 04/05/2016   Pulmonary embolism ([place])    Tobacco use disorder 03/28/2015    Past Surgical History:  Procedure Laterality Date   ABDOMINAL AORTOGRAM W/LOWER EXTREMITY Right 06/26/2020   Procedure: ABDOMINAL AORTOGRAM W/LOWER EXTREMITY;  Surgeon: Lorretta Harp, MD;  Location: [place] [facility];  Service: Cardiovascular;  Laterality: Right;   COLONOSCOPY     PERIPHERAL VASCULAR INTERVENTION  06/26/2020   Procedure: PERIPHERAL VASCULAR INTERVENTION;  Surgeon: Lorretta Harp, MD;  Location: [place] [facility];  Service: Cardiovascular;;  Right SFA   TONSILLECTOMY      There were no vitals filed for this visit.   Subjective Assessment - 04/17/21 1547     Subjective Has increased Rt hip pain due to hernia this date.    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 5-10 minutes and then I  feel SOB and pain    Patient Stated Goals be able to get up and down large steps and off the floor; be less short of breath    Currently in Pain? 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; farmer carries 20lbs/15lbs x 2 trips      Lumbar Exercises: Seated   Other Seated Lumbar Exercises seated on BOSU chops; 3lbs; x10 Rt/Lt      Knee/Hip Exercises: Aerobic   Other Aerobic UBE L2: x4 minutes 2/2      Knee/Hip Exercises: Machines for Strengthening   Cybex Leg  Press 70# seat 7  x25, single LE 35# Rt and Lt x15      Knee/Hip Exercises: Seated   Long Arc Quad Limitations with ball squeeze; x10 Rt/Lt    Clamshell with TheraBand --   black loop; 2 x 15 reps                     PT Short Term Goals - 04/17/21 1549       PT SHORT TERM GOAL #2   Title Pt will be able to go up and down 4 steps reciprocally 3 x using one handrail due to improved strength    Baseline ascending with 1 rail.  Descdending pt demonstrated instability and required both rails    Time 6    Period Weeks    Status On-going   ascending/descending with 1 rail; impaired eccentric control on descent   Target Date 02/06/21      PT SHORT TERM GOAL #3   Title Pt will perform 6 min walk test for baseline to create long term goal    Baseline 1183 ft    Time 1    Period Weeks    Status Achieved   previously achieved; 840 feet this date   Target Date 01/02/21               PT Long Term  Goals - 04/17/21 1550       PT LONG TERM GOAL #6   Title Pt will perform 6 min walk test of at least 1250 ft to improve endurance and community function    Baseline 968 feet- Rt hip pain 3-5/10 (had this before walking)    Time 12    Period Weeks    Status On-going   840 feet; Rt hip pain requiring seated recovery at 3 minutes                  Plan - 04/17/21 1547     Clinical Impression Statement Patient demonstrates continued activity tolerance impairments as he was unable to ambulate >900 feet during 6 minute walk test due to increased Rt hip pain. Requiring frequent verbal cues for decreased posterior pelvic tilt in sitting. Would benefit from continued skilled intervention to address impairments for decreased fall risk.    Examination-Activity Limitations Lift;Transfers;Sit;Squat    Examination-Participation Restrictions Occupation;Community Activity;Driving    Rehab Potential Good    PT Frequency 2x / week    PT Duration 12 weeks    PT Treatment/Interventions ADLs/Self Care Home Management;Cryotherapy;Electrical Stimulation;Moist Heat;Traction;Gait Scientist, forensic;Therapeutic activities;Therapeutic exercise;Balance training;Neuromuscular re-education;Patient/family education;Manual techniques;Taping    PT Next Visit Plan continue to progress functional strengthening and core strengthening    PT Home Exercise Plan Access Code: [email or web address]    Consulted and Agree with Plan of Care Patient             Patient will benefit from skilled therapeutic intervention in order to improve the following deficits and impairments:  Pain,Postural dysfunction,Decreased strength,Difficulty walking,Decreased balance,Abnormal gait,Decreased endurance  Visit Diagnosis: Muscle weakness (generalized)  Difficulty in walking, not elsewhere classified  Physical deconditioning     Problem List Patient Active Problem List   Diagnosis Date Noted   Coronary artery disease 03/13/2021    Acute pulmonary embolism ([place]) 10/18/2020   Groin pain, right 08/31/2020   Chronic viral hepatitis B without delta-agent ([place]) 08/21/2020   Amyloidosis ([place]) 08/21/2020   Anasarca 08/04/2020   Nephrotic syndrome 08/03/2020   AKI (acute kidney injury) ([place]) 08/03/2020  Hypokalemia 06/27/2020   Claudication in peripheral vascular disease ([place]) 06/26/2020   Hypertension associated with diabetes ([place]) 05/25/2020   Peripheral arterial disease ([place]) 03/08/2020   Vitamin D deficiency 10/26/2019   Hypoalbuminemia 10/26/2019   IBD (inflammatory bowel disease) 04/05/2016   Tobacco use disorder 03/28/2015   Non-insulin treated type 2 diabetes mellitus ([place]) 01/25/2014   Essential hypertension, benign 04/16/2013   Hyperlipemia 04/16/2013   Gout 04/16/2013    Everardo All PT, DPT  04/17/21 3:52 PM   [facility] [address], [place], [place], [address] Phone: [phone]   Fax:  [phone]  Name: Samuel Butler MRN: [id number] Date of Birth: 09-14-1961  PHYSICAL THERAPY DISCHARGE SUMMARY  Visits from Start of Care: 17  Current functional level related to goals / functional outcomes: See above   Remaining deficits: See above   Education / Equipment: See above   Plan: Patient agrees to discharge.  Patient goals were partially met. Patient is being discharged at their request. PT happy to reassess should patient status change.

## 2021-04-19 ENCOUNTER — Telehealth: Payer: Self-pay | Admitting: Physical Therapy

## 2021-04-19 ENCOUNTER — Encounter: Payer: Self-pay | Admitting: Physical Therapy

## 2021-04-19 ENCOUNTER — Ambulatory Visit: Payer: BC Managed Care – PPO | Admitting: Physical Therapy

## 2021-04-19 NOTE — Telephone Encounter (Signed)
Spoke with patient and his wife, he reports that he has had watery diarrhea for about 6 months. He reports that he has been losing a lot of weight - about 7-10 lbs over the last month. Pt reports that he has the urgency to have a bowel movement about 15-30 minutes after eating anything. Pt states that he tolerates liquids OK but his appetite isn't good. He reports that he takes Imodium as needed because it causes constipation, after he takes the Imodium he will have 1 "normal" bowel movement and then constipation. Patient reports that he has been taking Dicyclomine 2 capsules in the morning and evening. Advised that he is to take 1 capsule before meals and at bedtime. Pt reports that his stomach makes a "loud, rumbling noise." Pt reports that when he has the diarrhea it comes out in a "light, yellow color at first." Denies any nausea, vomiting or a fever. He does report chills. Please advise, thanks

## 2021-04-19 NOTE — Telephone Encounter (Signed)
Spoke with patient in regards to recommendations. Patient has been scheduled for a follow up with Ellouise Newer, PA-C on Monday, 05/21/21 at 11 am. Patient verbalized understanding and had no concerns at the end of the call.

## 2021-04-19 NOTE — Telephone Encounter (Signed)
Contacting patient due to him having missed appointment today at 1145. Patient states that cancellation was attempted through my Chart. Therapist speaking with patient and his wife. Expressed concerns as he does not feel that he is improving. Therapist reviewing goals and progression with regards to activity tolerance during session. Patient states that he feels better after therapy and the next day but continues to have increased fatigue following daily tasks such as mowing the lawn. Therapist advising patient that communication is important and thus when daily updates are requested at the start of each session, it is important to convey all concerns. Mrs. Feldhaus stating that his MD will be contacted as he has been having diarrhea and other GI disturbances and they both feel that he is losing too much weight. Patient expressing frustration due to prolonged illness and feeling that communication is not unified between different providers regarding his care. Therapist expressing agreement with patient that hold for physical therapy could be beneficial as well as further communication with MD. Patient in agreement.

## 2021-04-20 ENCOUNTER — Inpatient Hospital Stay: Payer: BC Managed Care – PPO

## 2021-04-20 ENCOUNTER — Inpatient Hospital Stay (HOSPITAL_BASED_OUTPATIENT_CLINIC_OR_DEPARTMENT_OTHER): Payer: BC Managed Care – PPO | Admitting: Hematology and Oncology

## 2021-04-20 ENCOUNTER — Other Ambulatory Visit: Payer: Self-pay

## 2021-04-20 ENCOUNTER — Inpatient Hospital Stay: Payer: BC Managed Care – PPO | Attending: Hematology and Oncology

## 2021-04-20 VITALS — BP 130/100 | HR 105

## 2021-04-20 VITALS — BP 142/105 | HR 93 | Temp 98.4°F | Resp 18 | Wt 199.1 lb

## 2021-04-20 DIAGNOSIS — E785 Hyperlipidemia, unspecified: Secondary | ICD-10-CM | POA: Insufficient documentation

## 2021-04-20 DIAGNOSIS — I10 Essential (primary) hypertension: Secondary | ICD-10-CM | POA: Diagnosis not present

## 2021-04-20 DIAGNOSIS — Z87891 Personal history of nicotine dependence: Secondary | ICD-10-CM | POA: Diagnosis not present

## 2021-04-20 DIAGNOSIS — R5383 Other fatigue: Secondary | ICD-10-CM | POA: Diagnosis not present

## 2021-04-20 DIAGNOSIS — K580 Irritable bowel syndrome with diarrhea: Secondary | ICD-10-CM | POA: Diagnosis not present

## 2021-04-20 DIAGNOSIS — Z8249 Family history of ischemic heart disease and other diseases of the circulatory system: Secondary | ICD-10-CM | POA: Insufficient documentation

## 2021-04-20 DIAGNOSIS — Z800 Family history of malignant neoplasm of digestive organs: Secondary | ICD-10-CM | POA: Diagnosis not present

## 2021-04-20 DIAGNOSIS — M25559 Pain in unspecified hip: Secondary | ICD-10-CM | POA: Insufficient documentation

## 2021-04-20 DIAGNOSIS — R21 Rash and other nonspecific skin eruption: Secondary | ICD-10-CM | POA: Insufficient documentation

## 2021-04-20 DIAGNOSIS — E8581 Light chain (AL) amyloidosis: Secondary | ICD-10-CM

## 2021-04-20 DIAGNOSIS — Z23 Encounter for immunization: Secondary | ICD-10-CM | POA: Insufficient documentation

## 2021-04-20 DIAGNOSIS — Z833 Family history of diabetes mellitus: Secondary | ICD-10-CM | POA: Insufficient documentation

## 2021-04-20 DIAGNOSIS — R634 Abnormal weight loss: Secondary | ICD-10-CM | POA: Diagnosis not present

## 2021-04-20 DIAGNOSIS — Z7901 Long term (current) use of anticoagulants: Secondary | ICD-10-CM | POA: Insufficient documentation

## 2021-04-20 DIAGNOSIS — Z86711 Personal history of pulmonary embolism: Secondary | ICD-10-CM | POA: Diagnosis not present

## 2021-04-20 DIAGNOSIS — Z79899 Other long term (current) drug therapy: Secondary | ICD-10-CM | POA: Diagnosis not present

## 2021-04-20 DIAGNOSIS — Z5112 Encounter for antineoplastic immunotherapy: Secondary | ICD-10-CM | POA: Diagnosis present

## 2021-04-20 LAB — CBC WITH DIFFERENTIAL (CANCER CENTER ONLY)
Abs Immature Granulocytes: 0 10*3/uL (ref 0.00–0.07)
Basophils Absolute: 0 10*3/uL (ref 0.0–0.1)
Basophils Relative: 1 %
Eosinophils Absolute: 0.2 10*3/uL (ref 0.0–0.5)
Eosinophils Relative: 4 %
HCT: 32.4 % — ABNORMAL LOW (ref 39.0–52.0)
Hemoglobin: 11 g/dL — ABNORMAL LOW (ref 13.0–17.0)
Immature Granulocytes: 0 %
Lymphocytes Relative: 31 %
Lymphs Abs: 1.2 10*3/uL (ref 0.7–4.0)
MCH: 33.8 pg (ref 26.0–34.0)
MCHC: 34 g/dL (ref 30.0–36.0)
MCV: 99.7 fL (ref 80.0–100.0)
Monocytes Absolute: 0.5 10*3/uL (ref 0.1–1.0)
Monocytes Relative: 12 %
Neutro Abs: 2.2 10*3/uL (ref 1.7–7.7)
Neutrophils Relative %: 52 %
Platelet Count: 183 10*3/uL (ref 150–400)
RBC: 3.25 MIL/uL — ABNORMAL LOW (ref 4.22–5.81)
RDW: 14.2 % (ref 11.5–15.5)
WBC Count: 4.1 10*3/uL (ref 4.0–10.5)
nRBC: 0 % (ref 0.0–0.2)

## 2021-04-20 LAB — CMP (CANCER CENTER ONLY)
ALT: 14 U/L (ref 0–44)
AST: 19 U/L (ref 15–41)
Albumin: 1.6 g/dL — ABNORMAL LOW (ref 3.5–5.0)
Alkaline Phosphatase: 83 U/L (ref 38–126)
Anion gap: 9 (ref 5–15)
BUN: 14 mg/dL (ref 8–23)
CO2: 30 mmol/L (ref 22–32)
Calcium: 7.9 mg/dL — ABNORMAL LOW (ref 8.9–10.3)
Chloride: 105 mmol/L (ref 98–111)
Creatinine: 1.18 mg/dL (ref 0.61–1.24)
GFR, Estimated: >60 mL/min (ref >60)
Glucose, Bld: 86 mg/dL (ref 70–99)
Potassium: 3.4 mmol/L — ABNORMAL LOW (ref 3.5–5.1)
Sodium: 144 mmol/L (ref 135–145)
Total Bilirubin: 0.4 mg/dL (ref 0.3–1.2)
Total Protein: 4.5 g/dL — ABNORMAL LOW (ref 6.5–8.1)

## 2021-04-20 MED ORDER — ACETAMINOPHEN 325 MG PO TABS
ORAL_TABLET | ORAL | Status: AC
  Filled 2021-04-20: qty 2

## 2021-04-20 MED ORDER — DIPHENHYDRAMINE HCL 25 MG PO CAPS
50.0000 mg | ORAL_CAPSULE | Freq: Once | ORAL | Status: AC
  Administered 2021-04-20: 50 mg via ORAL

## 2021-04-20 MED ORDER — DARATUMUMAB-HYALURONIDASE-FIHJ 1800-30000 MG-UT/15ML ~~LOC~~ SOLN
1800.0000 mg | Freq: Once | SUBCUTANEOUS | Status: AC
  Administered 2021-04-20: 1800 mg via SUBCUTANEOUS
  Filled 2021-04-20: qty 15

## 2021-04-20 MED ORDER — DIPHENHYDRAMINE HCL 25 MG PO CAPS
ORAL_CAPSULE | ORAL | Status: AC
  Filled 2021-04-20: qty 2

## 2021-04-20 MED ORDER — DEXAMETHASONE 4 MG PO TABS
20.0000 mg | ORAL_TABLET | Freq: Once | ORAL | Status: AC
  Administered 2021-04-20: 20 mg via ORAL

## 2021-04-20 MED ORDER — ACETAMINOPHEN 325 MG PO TABS
650.0000 mg | ORAL_TABLET | Freq: Once | ORAL | Status: AC
  Administered 2021-04-20: 650 mg via ORAL

## 2021-04-20 NOTE — Progress Notes (Signed)
Per Dr. Lorenso Courier, ok for treatment today with elevated heart rate and blood pressure. Pt. denies complaints of chest pain, dizziness, and no shortness of breath noted.

## 2021-04-20 NOTE — Patient Instructions (Addendum)
[facility]  Discharge Instructions: Thank you for choosing [facility] to provide your oncology and hematology care.   If you have a lab appointment with the Cancer Center, please go directly to the Cancer Center and check in at the registration area.   Wear comfortable clothing and clothing appropriate for easy access to any Portacath or PICC line.   We strive to give you quality time with your provider. You may need to reschedule your appointment if you arrive late (15 or more minutes).  Arriving late affects you and other patients whose appointments are after yours.  Also, if you miss three or more appointments without notifying the office, you may be dismissed from the clinic at the provider's discretion.      For prescription refill requests, have your pharmacy contact our office and allow 72 hours for refills to be completed.    Today you received the following chemotherapy and/or immunotherapy agent: Daratumumab      To help prevent nausea and vomiting after your treatment, we encourage you to take your nausea medication as directed.  BELOW ARE SYMPTOMS THAT SHOULD BE REPORTED IMMEDIATELY: *FEVER GREATER THAN 100.4 F (38 C) OR HIGHER *CHILLS OR SWEATING *NAUSEA AND VOMITING THAT IS NOT CONTROLLED WITH YOUR NAUSEA MEDICATION *UNUSUAL SHORTNESS OF BREATH *UNUSUAL BRUISING OR BLEEDING *URINARY PROBLEMS (pain or burning when urinating, or frequent urination) *BOWEL PROBLEMS (unusual diarrhea, constipation, pain near the anus) TENDERNESS IN MOUTH AND THROAT WITH OR WITHOUT PRESENCE OF ULCERS (sore throat, sores in mouth, or a toothache) UNUSUAL RASH, SWELLING OR PAIN  UNUSUAL VAGINAL DISCHARGE OR ITCHING   Items with * indicate a potential emergency and should be followed up as soon as possible or go to the Emergency Department if any problems should occur.  Please show the CHEMOTHERAPY ALERT CARD or IMMUNOTHERAPY ALERT CARD at check-in to  the Emergency Department and triage nurse.  Should you have questions after your visit or need to cancel or reschedule your appointment, please contact [facility]  Dept: [phone]  and follow the prompts.  Office hours are 8:00 a.m. to 4:30 p.m. Monday - Friday. Please note that voicemails left after 4:00 p.m. may not be returned until the following business day.  We are closed weekends and major holidays. You have access to a nurse at all times for urgent questions. Please call the main number to the clinic Dept: [phone] and follow the prompts.   For any non-urgent questions, you may also contact your provider using MyChart. We now offer e-Visits for anyone 18 and older to request care online for non-urgent symptoms. For details visit [email or web address].   Also download the MyChart app! Go to the app store, search "MyChart", open the app, select [facility], and log in with your MyChart username and password.  Due to Covid, a mask is required upon entering the hospital/clinic. If you do not have a mask, one will be given to you upon arrival. For doctor visits, patients may have 1 support person aged 18 or older with them. For treatment visits, patients cannot have anyone with them due to current Covid guidelines and our immunocompromised population.   

## 2021-04-20 NOTE — Progress Notes (Signed)
[facility] [phone]   [phone]  PROGRESS NOTE  Patient Care Team: Isaac Bliss, Rayford Halsted, MD as PCP - General (Internal Medicine) Lorretta Harp, MD as PCP - Cardiology (Cardiology)  Hematological/Oncological History # AL Amyloidosis of the Kidney 1) 08/03/2020: Free kappa 19.5, Lambda 205.7, Kappa/Lambda ratio: 0.09. M protein undetectable in serum.  2) 08/07/2020: kidney biopsy performed, results consistent with lambda light chain AL amyloidosis 3) 08/23/2020: establish care with Dr. Lorenso Courier  4) 09/01/2020: bone marrow biopsy shows plasma cell neoplasm with focal amyloid deposits 5) 09/29/2020: start of Cycle 1 Day 1 of Dara-CyBorD 6) 11/02/2020: Cycle 2 Day 1 of Dara-CyBorD 7) 12/01/2020: Cycle 3 Day 1 of Dara-CyBorD 8) 12/29/2020: Cycle 4 Day 1 of Dara-CyBorD 9) 01/05/2021: Holding Velcade due to diarrhea.  10) 01/19/2021: Adding back Velcade due to improvement of diarrhea with imodium 11) 01/26/2021: Cycle 5 Day 1 of Dara-CyBorD 12) 02/23/2021: Cycle 6 Day 1 of Dara-CyBorD 13) 03/23/2021: Cycle 7 Day 1 of Dara-CyBorD 14) 04/20/2021: Cycle 8 Day 1 of Dara-CyBorD  Interval History:  Samuel Butler 60 y.o. male with medical history significant for AL amyloidosis of the kidney presents for a follow up visit. The patient's last visit was on 03/23/2021. In the interim since his last visit he has continued his treatment of Dara-CyBorD. Today, he is due for Cycle 8 Day 1.   On exam today Samuel Butler reports he has continued to have diarrhea "every time he turns around".  He notes that he has been bearing weight at but unfortunately he has continued to lose weight.  He is down to 199 pounds down from 210.  He notes that food "does not always have taste" and his appetite has been poor.  He has been avoiding the use of Imodium because it causes him constipation.  He notes that he is also been having some soreness and pain in his hip.  He has been working  with outpatient physical therapy in order to try to improve this.  He does get easily fatigued and has reported that when he cut the grass he "was not able do anything for 2 days".  He is otherwise willing and able to proceed with treatment today. He denies any fevers, chills, night sweats, worsening shortness of breath, chest pain or cough.  He has no other complaints.  A full 10 point ROS is listed below.  MEDICAL HISTORY:  Past Medical History:  Diagnosis Date   DDD (degenerative disc disease), lumbar    per his report   Diabetes mellitus without complication ([place])    Gout 04/16/2013   Hyperlipidemia    Hypertension    IBD (inflammatory bowel disease) 04/05/2016   Pulmonary embolism ([place])    Tobacco use disorder 03/28/2015    SURGICAL HISTORY: Past Surgical History:  Procedure Laterality Date   ABDOMINAL AORTOGRAM W/LOWER EXTREMITY Right 06/26/2020   Procedure: ABDOMINAL AORTOGRAM W/LOWER EXTREMITY;  Surgeon: Lorretta Harp, MD;  Location: [facility];  Service: Cardiovascular;  Laterality: Right;   COLONOSCOPY     PERIPHERAL VASCULAR INTERVENTION  06/26/2020   Procedure: PERIPHERAL VASCULAR INTERVENTION;  Surgeon: Lorretta Harp, MD;  Location: [facility];  Service: Cardiovascular;;  Right SFA   TONSILLECTOMY      SOCIAL HISTORY: Social History   Socioeconomic History   Marital status: Married    Spouse name: Not on file   Number of children: Not on file   Years of education: Not  on file   Highest education level: Not on file  Occupational History   Not on file  Tobacco Use   Smoking status: Former    Pack years: 0.00    Types: Cigarettes    Quit date: 10/23/2016    Years since quitting: 4.5   Smokeless tobacco: Never   Tobacco comments:    per patient 5 cigarettes a day   Vaping Use   Vaping Use: Never used  Substance and Sexual Activity   Alcohol use: Not Currently    Comment: occasional   Drug use: No   Sexual activity: Not on file  Other  Topics Concern   Not on file  Social History Narrative   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: living with wife      Spiritual Beliefs: Christian      Lifestyle: no regular exercise, diet described as fair            Social Determinants of Health   Financial Resource Strain: Medium Risk   Difficulty of Paying Living Expenses: Somewhat hard  Food Insecurity: No Food Insecurity   Worried About Charity fundraiser in the Last Year: Never true   [place] in the Last Year: Never true  Transportation Needs: No Transportation Needs   Lack of Transportation (Medical): No   Lack of Transportation (Non-Medical): No  Physical Activity: Not on file  Stress: Not on file  Social Connections: Not on file  Intimate Partner Violence: Not on file    FAMILY HISTORY: Family History  Problem Relation Age of Onset   Hypertension Father    Diabetes Father    Stroke Father    Pancreatic cancer Father    Colon cancer Brother    Esophageal cancer Neg Hx    Stomach cancer Neg Hx     ALLERGIES:  is allergic to crestor [rosuvastatin calcium].  MEDICATIONS:  Current Outpatient Medications  Medication Sig Dispense Refill   acetaminophen (TYLENOL) 650 MG CR tablet Take 1,300 mg by mouth every 8 (eight) hours as needed for pain.      acyclovir (ZOVIRAX) 400 MG tablet Take 1 tablet by mouth twice daily 60 tablet 0   albuterol (VENTOLIN HFA) 108 (90 Base) MCG/ACT inhaler Inhale 2 puffs into the lungs every 6 (six) hours as needed for wheezing or shortness of breath. 8 g 0   apixaban (ELIQUIS) 5 MG TABS tablet Take 1 tablet (5 mg total) by mouth 2 (two) times daily. 180 tablet 1   atorvastatin (LIPITOR) 80 MG tablet Take 1 tablet by mouth once daily 90 tablet 0   cilostazol (PLETAL) 50 MG tablet Take 1 tablet (50 mg total) by mouth 2 (two) times daily. 60 tablet 3   clopidogrel (PLAVIX) 75 MG tablet Take 1 tablet (75 mg total) by mouth daily with breakfast. 90 tablet 3    dicyclomine (BENTYL) 20 MG tablet TAKE 1 TABLET BY MOUTH 4 TIMES DAILY 20- 30 MINUTES BEFORE MEAL(S) AND AT BEDTIME 120 tablet 0   Evolocumab (REPATHA SURECLICK) 604 MG/ML SOAJ Inject 140 mg into the skin every 14 (fourteen) days. 2 mL 12   ezetimibe (ZETIA) 10 MG tablet Take 1 tablet (10 mg total) by mouth daily. 90 tablet 3   isosorbide mononitrate (IMDUR) 30 MG 24 hr tablet Take 1 tablet by mouth once daily 90 tablet 1   loperamide (IMODIUM) 1 MG/5ML solution Take 2 mg by mouth as needed for diarrhea  or loose stools.     losartan (COZAAR) 50 MG tablet Take 1 tablet (50 mg total) by mouth daily. 90 tablet 3   metFORMIN (GLUCOPHAGE) 500 MG tablet Take 1 tablet by mouth once daily with breakfast 90 tablet 0   ondansetron (ZOFRAN) 8 MG tablet TAKE 1 TABLET BY MOUTH EVERY 8 HOURS AS NEEDED FOR NAUSEA OR VOMITING 30 tablet 0   pantoprazole (PROTONIX) 40 MG tablet Take 1 tablet (40 mg total) by mouth daily. 90 tablet 1   potassium chloride SA (KLOR-CON) 20 MEQ tablet Take 2 tablets (40 mEq total) by mouth daily. 80MG x3 days then back to 40 mg/day 33 tablet 6   torsemide (DEMADEX) 20 MG tablet Take 1 tablet (20 mg total) by mouth 2 (two) times daily. 31m BID x 3 days then back to 258mBID 33 tablet 6   VEMLIDY 25 MG TABS Take 1 tablet (25 mg total) by mouth daily. 30 tablet 5   Vitamin D, Ergocalciferol, (DRISDOL) 1.25 MG (50000 UNIT) CAPS capsule Take 1 capsule by mouth once a week 12 capsule 0   Current Facility-Administered Medications  Medication Dose Route Frequency Provider Last Rate Last Admin   0.9 %  sodium chloride infusion  500 mL Intravenous Once StLadene ArtistMD        REVIEW OF SYSTEMS:   Constitutional: ( - ) fevers, ( - )  chills , ( - ) night sweats Eyes: ( - ) blurriness of vision, ( - ) double vision, ( - ) watery eyes Ears, nose, mouth, throat, and face: ( - ) mucositis, ( - ) sore throat Respiratory: ( - ) cough, ( +) dyspnea, ( - ) wheezes Cardiovascular: ( - )  palpitation, ( - ) chest discomfort, ( +) lower extremity swelling Gastrointestinal:  ( - ) nausea, ( - ) heartburn, ( + ) change in bowel habits Skin: ( - ) abnormal skin rashes Lymphatics: ( - ) new lymphadenopathy, ( - ) easy bruising Neurological: (+) numbness, ( - ) tingling, ( - ) new weaknesses Behavioral/Psych: ( - ) mood change, ( - ) new changes  All other systems were reviewed with the patient and are negative.  PHYSICAL EXAMINATION: ECOG PERFORMANCE STATUS: 1 - Symptomatic but completely ambulatory  Vitals:   04/20/21 1040  BP: (!) 142/105  Pulse: 93  Resp: 18  Temp: 98.4 F (36.9 C)  SpO2: 100%   Filed Weights   04/20/21 1040  Weight: 199 lb 1.6 oz (90.3 kg)    GENERAL: well appearing middle aged AfSerbiamerican male alert, no distress and comfortable SKIN: skin color, texture, turgor are normal, no rashes or significant lesions EYES: conjunctiva are pink and non-injected, sclera clear LUNGS: clear to auscultation and percussion with normal breathing effort HEART: regular rate & rhythm and no murmurs and +2 lower extremity edema bilaterally  Musculoskeletal: no cyanosis of digits and no clubbing  PSYCH: alert & oriented x 3, fluent speech NEURO: no focal motor/sensory deficits  LABORATORY DATA:  I have reviewed the data as listed CBC Latest Ref Rng & Units 04/20/2021 03/23/2021 03/16/2021  WBC 4.0 - 10.5 K/uL 4.1 3.0(L) 3.2(L)  Hemoglobin 13.0 - 17.0 g/dL 11.0(L) 10.8(L) 10.9(L)  Hematocrit 39.0 - 52.0 % 32.4(L) 33.1(L) 32.0(L)  Platelets 150 - 400 K/uL 183 117(L) 125(L)    CMP Latest Ref Rng & Units 04/20/2021 03/23/2021 03/16/2021  Glucose 70 - 99 mg/dL 86 90 93  BUN 8 - 23 mg/dL 14 17 20  Creatinine 0.61 - 1.24 mg/dL 1.18 1.10 1.06  Sodium 135 - 145 mmol/L 144 145 146(H)  Potassium 3.5 - 5.1 mmol/L 3.4(L) 3.4(L) 3.6  Chloride 98 - 111 mmol/L 105 107 108  CO2 22 - 32 mmol/L 30 29 30   Calcium 8.9 - 10.3 mg/dL 7.9(L) 7.8(L) 7.6(L)  Total Protein 6.5 - 8.1  g/dL 4.5(L) 4.1(L) 4.1(L)  Total Bilirubin 0.3 - 1.2 mg/dL 0.4 0.4 0.4  Alkaline Phos 38 - 126 U/L 83 81 76  AST 15 - 41 U/L 19 31 20   ALT 0 - 44 U/L 14 29 19     Lab Results  Component Value Date   MPROTEIN 0.1 (H) 03/16/2021   MPROTEIN 0.1 (H) 02/09/2021   MPROTEIN Not Observed 01/12/2021   Lab Results  Component Value Date   KPAFRELGTCHN 17.5 04/20/2021   KPAFRELGTCHN 10.7 03/16/2021   KPAFRELGTCHN 10.2 02/09/2021   LAMBDASER 20.0 04/20/2021   LAMBDASER 12.5 03/16/2021   LAMBDASER 13.7 02/09/2021   KAPLAMBRATIO 0.88 04/20/2021   KAPLAMBRATIO 0.86 03/16/2021   KAPLAMBRATIO 1.51 (L) 02/26/2021     RADIOGRAPHIC STUDIES: VAS [place] ABI WITH/WO TBI  Result Date: 04/02/2021  LOWER EXTREMITY DOPPLER STUDY Patient Name:  Samuel Butler  Date of Exam:   04/02/2021 Medical Rec #: [id number]        Accession #:    [id number] Date of Birth: [birth year]        Patient Gender: M Patient Age:   74Y Exam Location:  [facility] Procedure:      VAS [place] ABI WITH/WO TBI Referring Phys: [id number] [place] --------------------------------------------------------------------------------  Indications: Claudication, and peripheral artery disease. High Risk         Hypertension, hyperlipidemia, Diabetes, past history of Factors:          smoking. Other Factors: Patient complains of left leg claudication when walking short                distances. He complains of numbness and tingling in both legs.  Vascular Interventions: 06/26/2020 Right proximal and mid SFA stents placed. Comparison Study: 07/2020- Right ABI .93; Left ABI 1.03 Performing Technologist: Wilkie Aye RVT  Examination Guidelines: A complete evaluation includes at minimum, Doppler waveform signals and systolic blood pressure reading at the level of bilateral brachial, anterior tibial, and posterior tibial arteries, when vessel segments are accessible. Bilateral testing is considered an integral part of a complete examination. Photoelectric  Plethysmograph (PPG) waveforms and toe systolic pressure readings are included as required and additional duplex testing as needed. Limited examinations for reoccurring indications may be performed as noted.  ABI Findings: +---------+------------------+-----+--------+--------+ Right    Rt Pressure (mmHg)IndexWaveformComment  +---------+------------------+-----+--------+--------+ Brachial 130                                     +---------+------------------+-----+--------+--------+ ATA      113               0.87 biphasic         +---------+------------------+-----+--------+--------+ PTA      99                0.76 biphasic         +---------+------------------+-----+--------+--------+ PERO     93                0.72 biphasic         +---------+------------------+-----+--------+--------+ Heloise Ochoa  0.67 Abnormal         +---------+------------------+-----+--------+--------+ +---------+------------------+-----+----------+-------+ Left     Lt Pressure (mmHg)IndexWaveform  Comment +---------+------------------+-----+----------+-------+ Brachial 126                                      +---------+------------------+-----+----------+-------+ ATA      92                0.71 biphasic          +---------+------------------+-----+----------+-------+ PTA      97                0.75 monophasic        +---------+------------------+-----+----------+-------+ PERO     100               0.77 monophasic        +---------+------------------+-----+----------+-------+ Great Toe76                0.58 Abnormal          +---------+------------------+-----+----------+-------+ +-------+-----------+-----------+------------+------------+ ABI/TBIToday's ABIToday's TBIPrevious ABIPrevious TBI +-------+-----------+-----------+------------+------------+ Right  0.87       0.67       0.93        0.87          +-------+-----------+-----------+------------+------------+ Left   0.77       0.58       1.03        0.58         +-------+-----------+-----------+------------+------------+ Bilateral ABIs and TBIs appear decreased compared to prior study on 07/2020.  Summary: Right: Resting right ankle-brachial index indicates mild right lower extremity arterial disease. The right toe-brachial index is abnormal. Left: Resting left ankle-brachial index indicates moderate left lower extremity arterial disease. The left toe-brachial index is abnormal.  *See table(s) above for measurements and observations.  Suggest follow up study in 12 months. Vascular consult recommended. Electronically signed by Ida Rogue MD on 04/02/2021 at 9:01:23 PM.    Final    VAS [place] LOWER EXTREMITY ARTERIAL DUPLEX  Result Date: 04/02/2021 LOWER EXTREMITY ARTERIAL DUPLEX STUDY Patient Name:  Samuel Butler  Date of Exam:   04/02/2021 Medical Rec #: [id number]        Accession #:    [id number] Date of Birth: [birth year]        Patient Gender: M Patient Age:   33Y Exam Location:  [facility] Procedure:      VAS [place] LOWER EXTREMITY ARTERIAL DUPLEX Referring Phys: [id number] --------------------------------------------------------------------------------  Indications: Claudication, and peripheral artery disease. High Risk         Hypertension, hyperlipidemia, Diabetes, past history of Factors:          smoking. Other Factors: Patient complains of left leg claudication when walking short                distances. He complains of numbness and tingling in both legs.  Vascular Interventions: 06/26/2020 Right proximal and mid SFA stents placed. Current ABI:            Today, the right ABI was 0.87 and 0.77 on the left. The                         left ABI has dropped compared to prior exam. Comparison Study: 07/2020-Patent right SFA stent with no restenosis. Performing Technologist: Wilkie Aye RVT  Examination Guidelines: A complete evaluation  includes B-mode  imaging, spectral Doppler, color Doppler, and power Doppler as needed of all accessible portions of each vessel. Bilateral testing is considered an integral part of a complete examination. Limited examinations for reoccurring indications may be performed as noted.  +----------+--------+-----+--------+---------+--------+ RIGHT     PSV cm/sRatioStenosisWaveform Comments +----------+--------+-----+--------+---------+--------+ CFA Prox  65                   triphasic         +----------+--------+-----+--------+---------+--------+ DFA       93                   triphasic         +----------+--------+-----+--------+---------+--------+ SFA Prox  80                   triphasic         +----------+--------+-----+--------+---------+--------+ SFA Mid   63                   triphasic         +----------+--------+-----+--------+---------+--------+ SFA Distal62                   triphasic         +----------+--------+-----+--------+---------+--------+ POP Prox  48                   triphasic         +----------+--------+-----+--------+---------+--------+ POP Distal54                   triphasic         +----------+--------+-----+--------+---------+--------+ TP Trunk  37                   triphasic         +----------+--------+-----+--------+---------+--------+  Right Stent(s): +---------------+--------+--------+---------+--------+ Prox SFA       PSV cm/sStenosisWaveform Comments +---------------+--------+--------+---------+--------+ Prox to Stent  80              triphasic         +---------------+--------+--------+---------+--------+ Proximal Stent 51              triphasic         +---------------+--------+--------+---------+--------+ Mid Stent      72              triphasic         +---------------+--------+--------+---------+--------+ Distal Stent   75              triphasic          +---------------+--------+--------+---------+--------+ Distal to Stent92              triphasic         +---------------+--------+--------+---------+--------+  +---------------+--------+--------+---------+--------+ mid/distal SFA PSV cm/sStenosisWaveform Comments +---------------+--------+--------+---------+--------+ Prox to Stent  62              triphasic         +---------------+--------+--------+---------+--------+ Proximal Stent 77              triphasic         +---------------+--------+--------+---------+--------+ Mid Stent      72              triphasic         +---------------+--------+--------+---------+--------+ Distal Stent   49              triphasic         +---------------+--------+--------+---------+--------+ Distal to Stent58              triphasic         +---------------+--------+--------+---------+--------+   +----------+--------+-----+---------------+----------+--------+  LEFT      PSV cm/sRatioStenosis       Waveform  Comments +----------+--------+-----+---------------+----------+--------+ CFA Prox  75                          triphasic          +----------+--------+-----+---------------+----------+--------+ DFA       142                         triphasic          +----------+--------+-----+---------------+----------+--------+ SFA Prox  48                          triphasic          +----------+--------+-----+---------------+----------+--------+ SFA Mid   52                          biphasic           +----------+--------+-----+---------------+----------+--------+ SFA Distal263     3.1  50-74% stenosismonophasic         +----------+--------+-----+---------------+----------+--------+ POP Prox  60                          monophasic         +----------+--------+-----+---------------+----------+--------+ POP Distal31                          monophasic          +----------+--------+-----+---------------+----------+--------+ TP Trunk  242     6.0  75-99% stenosismonophasic         +----------+--------+-----+---------------+----------+--------+ ATA Prox  33                          monophasic         +----------+--------+-----+---------------+----------+--------+ PTA Mid   21                          monophasic         +----------+--------+-----+---------------+----------+--------+ PERO Mid  22                          monophasic         +----------+--------+-----+---------------+----------+--------+ A focal velocity elevation of 287 cm/s was obtained at Distal SFA with post stenotic turbulence with a VR of 3.1. Findings are characteristic of 50-74% stenosis. A 2nd focal velocity elevation was visualized, measuring 242 cm/s at TPT with post stenotic turbulence with a VR of 6.05. Findings are characteristic of 75-99% stenosis.  Summary: Right: No significant change compared to previous study. Patent SFA stent with no restenosis. Left: 50-74% stenosis in the distal SFA. 75-99% stenosis in the tibio-peroneal trunk. Three vessel run off.  See table(s) above for measurements and observations. Suggest follow up study in 6 months. Vascular consult recommended. Electronically signed by Ida Rogue MD on 04/02/2021 at 9:00:00 PM.    Final      ASSESSMENT & PLAN Samuel Butler 60 y.o. male with medical history significant for AL amyloidosis of the kidney presents for a follow up visit.  After review the labs, the records, discussion with the patient the findings are most consistent with AL amyloidosis affecting the kidney.  As such I recommended we start treatment with Dara-CyBorD.  After reviewing the biopsy results his findings are most consistent with AL amyloidosis.  As such the treatment of choice would be to target his plasma cell population with a triplet or quadruplet therapy.  Therapy of choice in this case would consist of daratumumab,  Velcade, cyclophosphamide, and dexamethasone.  Given the patient's good functional status we will start with full dose Dara-CyBorD.  I previously discussed the side effects of this chemotherapy with the patient including neuropathy, elevated blood pressure, drop in blood counts, hypersensitivity reaction, chest tightness, increased infection risk, and fatigue.  The patient and family voiced their understanding of these findings and are agreeable to moving forward with quadruple therapy with the goal of being a bridge to bone marrow transplant.    The regimen of choice is daratumumab, bortezomib, cyclophosphamide and dexamethasone per the ANDROMEDA Study ( Blood. 2020 Jul 2;136(1):71-80). Treatment consists of: Cyclophosphamide 300 mg/m2 intravenously and bortezomib 1.3 mg/m2 subcutaneously were given on days 1, 8, 15, and 22 of each 28 day cycle for up to 6 cycles. Dexamethasone 40 mg (starting dose) was given orally or intravenously weekly for each cycle for up to 6 cycles. DARA [place] was administered in a single, premixed vial and given by manual  injection over the course of 3 to 5 minutes weekly in cycles 1 to 2, every 2 weeks in cycles 3 to 6, and every 4 weeks thereafter as monotherapy for a maximum of 2 years. We would consider this continued dosing regimen.  On exam today,  Samuel Butler had improved swelling of his legs and continued diarrhea, but is otherwise stable.  His weight has been increasing slowly over the last month. He underwent colonoscopy on 03/28/2021 which showed no gross abnormalities but Congo red staining is pending.  Labs from today were reviewed without any intervention needed.  Patient will proceed with treatment as planned.  Patient will return to the clinic in 4 weeks as scheduled.  #AL Amyloidosis Affecting the Kidney --bone marrow biopsy and kidney biopsy helped to confirm the diagnosis of AL amyloidosis. --currently being treated with  Dara-CyBorD (per the [facility] Trial),  started Cycle 1 Day 1 on 09/29/2020.  --today is Cycle 8 Day 1 of treatment. Moving forward the patient will only require monthly injections.  --will monitoring monthly SPEP, UPEP, and SFLC while on treatment. --will have patient return for weekly treatment with q 4 week clinic visits  #Diarrhea with abdominal cramping --Improved with Imodium and Bentyl, but still has some persistent symptoms.  --Due to poor taste with liquid Imodium, recommended to switch to tablets. Advised to be more aggressive with Imodium since patient only takes it once a day.  --GI EGD/Colonoscopy performed on 03/28/2021.  No gross abnormalities noted, however Congo red stain is still pending on this tissue.   #Weight Loss, increasing --Diarrhea combined with diuresis is likely causing his weight loss --weight decreased to 199, down 11 lbs from 03/28/2021.  --Under the care of dietician and drinking protein shakes --continue to monitor  #Injection Site Rash, improved --appears benign on exam today  --completed antibiotic therapy  #Supportive Care --chemotherapy education performed --zofran 53m q8H PRN and compazine 131mPO q6H for nausea --acyclovir 40061mO BID for VCZ prophylaxis --albuterol HFA inhaler for daratumumab treatment -- EMLA cream for port not required, patient declines port at this time, but will reconsider if patient desires.  -- no pain medication required at this time.   Orders Placed This Encounter  Procedures   CMP ([facility])  Standing Status:   Standing    Number of Occurrences:   12    Standing Expiration Date:   04/20/2022   CBC with Differential (Cancer Center Only)    Standing Status:   Standing    Number of Occurrences:   12    Standing Expiration Date:   04/20/2022     All questions were answered. The patient knows to call the clinic with any problems, questions or concerns.  A total of more than 30 minutes were spent on this encounter and over half of that time was  spent on counseling and coordination of care as outlined above.   Samuel Peoples, MD Department of Hematology/Oncology [place] [facility] Phone: [phone] Pager: [phone] Email: [email or web address]   04/24/2021 2:56 PM   Literature Support:   Wendie Chess SY, Weiss BM, Rush Landmark, Merlini G, Comenzo RL. Daratumumab plus CyBorD for patients with newly diagnosed AL amyloidosis: safety run-in results of [facility]. Blood. 2020 Jul 2;136(1):71-80.   --Daratumumab-CyBorD was well tolerated, with no new safety concerns versus the intravenous formulation, and demonstrated robust hematologic and organ responses.

## 2021-04-21 ENCOUNTER — Other Ambulatory Visit: Payer: Self-pay | Admitting: Hematology and Oncology

## 2021-04-21 ENCOUNTER — Other Ambulatory Visit: Payer: Self-pay | Admitting: Internal Medicine

## 2021-04-21 ENCOUNTER — Other Ambulatory Visit: Payer: Self-pay | Admitting: Physician Assistant

## 2021-04-21 DIAGNOSIS — I2699 Other pulmonary embolism without acute cor pulmonale: Secondary | ICD-10-CM

## 2021-04-23 ENCOUNTER — Encounter: Payer: Self-pay | Admitting: Internal Medicine

## 2021-04-23 LAB — KAPPA/LAMBDA LIGHT CHAINS
Kappa free light chain: 17.5 mg/L (ref 3.3–19.4)
Kappa, lambda light chain ratio: 0.88 (ref 0.26–1.65)
Lambda free light chains: 20 mg/L (ref 5.7–26.3)

## 2021-04-24 ENCOUNTER — Ambulatory Visit (INDEPENDENT_AMBULATORY_CARE_PROVIDER_SITE_OTHER): Payer: BC Managed Care – PPO | Admitting: Family Medicine

## 2021-04-24 ENCOUNTER — Encounter: Payer: Self-pay | Admitting: Hematology and Oncology

## 2021-04-24 ENCOUNTER — Other Ambulatory Visit: Payer: Self-pay

## 2021-04-24 ENCOUNTER — Ambulatory Visit (INDEPENDENT_AMBULATORY_CARE_PROVIDER_SITE_OTHER)
Admission: RE | Admit: 2021-04-24 | Discharge: 2021-04-24 | Disposition: A | Discharge status: Home / Self Care | Payer: BC Managed Care – PPO | Source: Ambulatory Visit | Attending: Family Medicine | Admitting: Family Medicine

## 2021-04-24 ENCOUNTER — Encounter: Payer: Self-pay | Admitting: Family Medicine

## 2021-04-24 VITALS — BP 144/84 | HR 120 | Temp 98.3°F | Wt 194.4 lb

## 2021-04-24 DIAGNOSIS — M25551 Pain in right hip: Secondary | ICD-10-CM | POA: Diagnosis not present

## 2021-04-24 DIAGNOSIS — K4090 Unilateral inguinal hernia, without obstruction or gangrene, not specified as recurrent: Secondary | ICD-10-CM | POA: Diagnosis not present

## 2021-04-24 NOTE — Telephone Encounter (Signed)
Spoke with the patients wife and scheduled an appt today with Dr Elease Hashimoto to arrive at 1pm as PCP does not have any openings today.

## 2021-04-24 NOTE — Progress Notes (Signed)
Established Patient Office Visit  Subjective:  Patient ID: Samuel Butler, male    DOB: March 10, 1961  Age: 60 y.o. MRN: [id number]  CC:  Chief Complaint  Patient presents with   Hip Pain    X 1 year, right hip, joint pian    HPI Quadry A Kinter presents for the following items  Right hip pain.  He has lateral hip pain but also occasionally anterior hip pain.  Progressive for 1 year.  No known injury.  He has pain over the greater trochanteric bursa region especially at night when he rolls over in bed.  Achy quality.  Has not tried any heat or ice.  He also relates large right inguinal hernia which is increased in size over the past year and becoming more painful at times.  He would like to consider seeing a surgeon for that.  Does have history of peripheral vascular disease and has stents right lower extremity but denies any recent claudication symptoms right lower extremity.  He has multiple other issues including history of PE, CAD, hypertension, type 2 diabetes, amyloidosis.  Has been followed through hematology regarding his amyloidosis  Past Medical History:  Diagnosis Date   DDD (degenerative disc disease), lumbar    per his report   Diabetes mellitus without complication ([place])    Gout 04/16/2013   Hyperlipidemia    Hypertension    IBD (inflammatory bowel disease) 04/05/2016   Pulmonary embolism ([place])    Tobacco use disorder 03/28/2015    Past Surgical History:  Procedure Laterality Date   ABDOMINAL AORTOGRAM W/LOWER EXTREMITY Right 06/26/2020   Procedure: ABDOMINAL AORTOGRAM W/LOWER EXTREMITY;  Surgeon: Lorretta Harp, MD;  Location: [facility];  Service: Cardiovascular;  Laterality: Right;   COLONOSCOPY     PERIPHERAL VASCULAR INTERVENTION  06/26/2020   Procedure: PERIPHERAL VASCULAR INTERVENTION;  Surgeon: Lorretta Harp, MD;  Location: [facility];  Service: Cardiovascular;;  Right SFA   TONSILLECTOMY      Family History  Problem Relation Age of  Onset   Hypertension Father    Diabetes Father    Stroke Father    Pancreatic cancer Father    Colon cancer Brother    Esophageal cancer Neg Hx    Stomach cancer Neg Hx     Social History   Socioeconomic History   Marital status: Married    Spouse name: Not on file   Number of children: Not on file   Years of education: Not on file   Highest education level: Not on file  Occupational History   Not on file  Tobacco Use   Smoking status: Former    Pack years: 0.00    Types: Cigarettes    Quit date: 10/23/2016    Years since quitting: 4.5   Smokeless tobacco: Never   Tobacco comments:    per patient 5 cigarettes a day   Vaping Use   Vaping Use: Never used  Substance and Sexual Activity   Alcohol use: Not Currently    Comment: occasional   Drug use: No   Sexual activity: Not on file  Other Topics Concern   Not on file  Social History Narrative   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: living with wife      Spiritual Beliefs: Christian      Lifestyle: no regular exercise, diet described as fair            Social Determinants  of Health   Financial Resource Strain: Medium Risk   Difficulty of Paying Living Expenses: Somewhat hard  Food Insecurity: No Food Insecurity   Worried About Running Out of Food in the Last Year: Never true   Ran Out of Food in the Last Year: Never true  Transportation Needs: No Transportation Needs   Lack of Transportation (Medical): No   Lack of Transportation (Non-Medical): No  Physical Activity: Not on file  Stress: Not on file  Social Connections: Not on file  Intimate Partner Violence: Not on file    Outpatient Medications Prior to Visit  Medication Sig Dispense Refill   acetaminophen (TYLENOL) 650 MG CR tablet Take 1,300 mg by mouth every 8 (eight) hours as needed for pain.      acyclovir (ZOVIRAX) 400 MG tablet Take 1 tablet by mouth twice daily 60 tablet 0   albuterol (VENTOLIN HFA) 108 (90 Base)  MCG/ACT inhaler Inhale 2 puffs into the lungs every 6 (six) hours as needed for wheezing or shortness of breath. 8 g 0   apixaban (ELIQUIS) 5 MG TABS tablet Take 1 tablet (5 mg total) by mouth 2 (two) times daily. 180 tablet 1   atorvastatin (LIPITOR) 80 MG tablet Take 1 tablet by mouth once daily 90 tablet 0   cilostazol (PLETAL) 50 MG tablet Take 1 tablet (50 mg total) by mouth 2 (two) times daily. 60 tablet 3   clopidogrel (PLAVIX) 75 MG tablet Take 1 tablet (75 mg total) by mouth daily with breakfast. 90 tablet 3   dicyclomine (BENTYL) 20 MG tablet TAKE 1 TABLET BY MOUTH 4 TIMES DAILY 20- 30 MINUTES BEFORE MEAL(S) AND AT BEDTIME 120 tablet 0   Evolocumab (REPATHA SURECLICK) 161 MG/ML SOAJ Inject 140 mg into the skin every 14 (fourteen) days. 2 mL 12   isosorbide mononitrate (IMDUR) 30 MG 24 hr tablet Take 1 tablet by mouth once daily 90 tablet 1   loperamide (IMODIUM) 1 MG/5ML solution Take 2 mg by mouth as needed for diarrhea or loose stools.     losartan (COZAAR) 50 MG tablet Take 1 tablet (50 mg total) by mouth daily. 90 tablet 3   metFORMIN (GLUCOPHAGE) 500 MG tablet Take 1 tablet by mouth once daily with breakfast 90 tablet 0   ondansetron (ZOFRAN) 8 MG tablet TAKE 1 TABLET BY MOUTH EVERY 8 HOURS AS NEEDED FOR NAUSEA OR VOMITING 30 tablet 0   pantoprazole (PROTONIX) 40 MG tablet Take 1 tablet (40 mg total) by mouth daily. 90 tablet 1   potassium chloride SA (KLOR-CON) 20 MEQ tablet Take 2 tablets (40 mEq total) by mouth daily. 80MG  x3 days then back to 40 mg/day 33 tablet 6   torsemide (DEMADEX) 20 MG tablet Take 1 tablet (20 mg total) by mouth 2 (two) times daily. 40mg  BID x 3 days then back to 20mg  BID 33 tablet 6   VEMLIDY 25 MG TABS Take 1 tablet (25 mg total) by mouth daily. 30 tablet 5   Vitamin D, Ergocalciferol, (DRISDOL) 1.25 MG (50000 UNIT) CAPS capsule Take 1 capsule by mouth once a week 12 capsule 0   ezetimibe (ZETIA) 10 MG tablet Take 1 tablet (10 mg total) by mouth daily. 90  tablet 3   Facility-Administered Medications Prior to Visit  Medication Dose Route Frequency Provider Last Rate Last Admin   0.9 %  sodium chloride infusion  500 mL Intravenous Once Ladene Artist, MD        Allergies  Allergen Reactions  Crestor [Rosuvastatin Calcium] Other (See Comments)    Joint aches    ROS Review of Systems  Constitutional:  Positive for appetite change. Negative for fever.  Respiratory:  Negative for shortness of breath.   Cardiovascular:  Negative for chest pain.  Genitourinary:  Negative for dysuria.  Musculoskeletal:  Positive for arthralgias.     Objective:    Physical Exam Vitals reviewed.  Constitutional:      Appearance: Normal appearance.  Cardiovascular:     Rate and Rhythm: Normal rate.  Pulmonary:     Effort: Pulmonary effort is normal.     Breath sounds: Normal breath sounds.  Genitourinary:    Comments: Large right inguinal hernia.  Soft and nontender Musculoskeletal:     Comments: He has tenderness over the right lateral hip bursa region.  He has somewhat limited range of motion with internal and external rotation of the left hip and right hip but right seems to be more restricted.  Neurological:     Mental Status: He is alert.    BP (!) 144/84 (BP Location: Left Arm, Patient Position: Sitting, Cuff Size: Normal)   Pulse (!) 120   Temp 98.3 F (36.8 C) (Oral)   Wt 194 lb 6.4 oz (88.2 kg)   SpO2 95%   BMI 27.11 kg/m  Wt Readings from Last 3 Encounters:  04/24/21 194 lb 6.4 oz (88.2 kg)  04/20/21 199 lb 1.6 oz (90.3 kg)  03/28/21 210 lb (95.3 kg)     Health Maintenance Due  Topic Date Due   Pneumococcal Vaccine 23-39 Years old (1 - PCV) Never done   Zoster Vaccines- Shingrix (1 of 2) Never done   FOOT EXAM  02/11/2019   OPHTHALMOLOGY EXAM  01/17/2021    There are no preventive care reminders to display for this patient.  Lab Results  Component Value Date   TSH 1.52 05/25/2020   Lab Results  Component Value  Date   WBC 4.1 04/20/2021   HGB 11.0 (L) 04/20/2021   HCT 32.4 (L) 04/20/2021   MCV 99.7 04/20/2021   PLT 183 04/20/2021   Lab Results  Component Value Date   NA 144 04/20/2021   K 3.4 (L) 04/20/2021   CO2 30 04/20/2021   GLUCOSE 86 04/20/2021   BUN 14 04/20/2021   CREATININE 1.18 04/20/2021   BILITOT 0.4 04/20/2021   ALKPHOS 83 04/20/2021   AST 19 04/20/2021   ALT 14 04/20/2021   PROT 4.5 (L) 04/20/2021   ALBUMIN 1.6 (L) 04/20/2021   CALCIUM 7.9 (L) 04/20/2021   ANIONGAP 9 04/20/2021   GFR 66.89 11/16/2020   Lab Results  Component Value Date   CHOL 303 (H) 11/16/2020   Lab Results  Component Value Date   HDL 49.80 11/16/2020   Lab Results  Component Value Date   LDLCALC 222 (H) 11/16/2020   Lab Results  Component Value Date   TRIG 158.0 (H) 11/16/2020   Lab Results  Component Value Date   CHOLHDL 6 11/16/2020   Lab Results  Component Value Date   HGBA1C 5.8 (A) 12/26/2020      Assessment & Plan:   #1 progressive right hip pain.  Suspect he has some bursitis of the right greater trochanter region but probably has some osteoarthritis as well based on exam.  -Obtain x-rays of the right hip to further assess.  If he has significant joint space loss consider orthopedic referral -If unremarkable consider steroid injection right lateral hip bursa region  #2 large  right inguinal hernia -Set up referral to general surgeon -Reviewed signs and symptoms of strangulation to watch out for    No orders of the defined types were placed in this encounter.   Follow-up: No follow-ups on file.    Carolann Littler, MD

## 2021-04-24 NOTE — Patient Instructions (Signed)
Go for right hip X-ray at [address] M-F 8 to 5 in the basement  I will set up referral to general surgery  If Hip x-ray is normal we might want to check with Dr Lorenso Courier to see if any problem with getting Depomedrol into right lateral hip bursa.

## 2021-04-25 ENCOUNTER — Other Ambulatory Visit: Payer: Self-pay

## 2021-04-25 DIAGNOSIS — M1990 Unspecified osteoarthritis, unspecified site: Secondary | ICD-10-CM

## 2021-04-25 LAB — MULTIPLE MYELOMA PANEL, SERUM
Albumin SerPl Elph-Mcnc: 1.8 g/dL — ABNORMAL LOW (ref 2.9–4.4)
Albumin/Glob SerPl: 0.9 (ref 0.7–1.7)
Alpha 1: 0.2 g/dL (ref 0.0–0.4)
Alpha2 Glob SerPl Elph-Mcnc: 1.1 g/dL — ABNORMAL HIGH (ref 0.4–1.0)
B-Globulin SerPl Elph-Mcnc: 0.6 g/dL — ABNORMAL LOW (ref 0.7–1.3)
Gamma Glob SerPl Elph-Mcnc: 0.2 g/dL — ABNORMAL LOW (ref 0.4–1.8)
Globulin, Total: 2.2 g/dL (ref 2.2–3.9)
IgA: 35 mg/dL — ABNORMAL LOW (ref 61–437)
IgG (Immunoglobin G), Serum: 194 mg/dL — ABNORMAL LOW (ref 603–1613)
IgM (Immunoglobulin M), Srm: 21 mg/dL (ref 20–172)
Total Protein ELP: 4 g/dL — ABNORMAL LOW (ref 6.0–8.5)

## 2021-04-26 ENCOUNTER — Ambulatory Visit: Payer: BC Managed Care – PPO | Admitting: Internal Medicine

## 2021-04-27 ENCOUNTER — Encounter: Payer: Self-pay | Admitting: Hematology and Oncology

## 2021-04-27 ENCOUNTER — Telehealth: Payer: Self-pay

## 2021-04-27 NOTE — Progress Notes (Signed)
Thank you for letting me know. I did have the [place] added on to look for amyloid deposits to help explain his symptoms. I agree with continued Imodium PRN.

## 2021-04-27 NOTE — Telephone Encounter (Signed)
Contacted pt and spoke to pt and wife regarding MyChart message. Pt not eating due to having diarrhea. Discussed use of imodium and how to use it. Pt not taking imodium because he didn't want to be constipated. Reviewed how to take imodium and how it should not cause constipation. Pt and wife verbalized understanding. Instructed pt to call back id the diarrhea does not stop when using imodium as directed.

## 2021-05-04 ENCOUNTER — Encounter: Payer: Self-pay | Admitting: Hematology and Oncology

## 2021-05-04 ENCOUNTER — Encounter: Payer: Self-pay | Admitting: Family Medicine

## 2021-05-04 DIAGNOSIS — M25559 Pain in unspecified hip: Secondary | ICD-10-CM

## 2021-05-05 ENCOUNTER — Encounter: Payer: Self-pay | Admitting: Family Medicine

## 2021-05-10 ENCOUNTER — Encounter: Payer: Self-pay | Admitting: Podiatry

## 2021-05-10 NOTE — Telephone Encounter (Signed)
Please schedule for upcoming appointment. thanks

## 2021-05-12 ENCOUNTER — Other Ambulatory Visit: Payer: Self-pay | Admitting: Hematology and Oncology

## 2021-05-13 ENCOUNTER — Encounter: Payer: Self-pay | Admitting: Hematology and Oncology

## 2021-05-16 ENCOUNTER — Ambulatory Visit (INDEPENDENT_AMBULATORY_CARE_PROVIDER_SITE_OTHER): Payer: BC Managed Care – PPO | Admitting: Podiatry

## 2021-05-16 ENCOUNTER — Encounter: Payer: Self-pay | Admitting: Podiatry

## 2021-05-16 ENCOUNTER — Other Ambulatory Visit: Payer: Self-pay

## 2021-05-16 ENCOUNTER — Ambulatory Visit (INDEPENDENT_AMBULATORY_CARE_PROVIDER_SITE_OTHER): Payer: BC Managed Care – PPO | Admitting: Internal Medicine

## 2021-05-16 ENCOUNTER — Encounter: Payer: Self-pay | Admitting: Internal Medicine

## 2021-05-16 VITALS — BP 152/108 | HR 97 | Ht 71.0 in | Wt 194.0 lb

## 2021-05-16 DIAGNOSIS — I739 Peripheral vascular disease, unspecified: Secondary | ICD-10-CM

## 2021-05-16 DIAGNOSIS — M130 Polyarthritis, unspecified: Secondary | ICD-10-CM | POA: Diagnosis not present

## 2021-05-16 DIAGNOSIS — N179 Acute kidney failure, unspecified: Secondary | ICD-10-CM

## 2021-05-16 DIAGNOSIS — B351 Tinea unguium: Secondary | ICD-10-CM | POA: Diagnosis not present

## 2021-05-16 DIAGNOSIS — I2510 Atherosclerotic heart disease of native coronary artery without angina pectoris: Secondary | ICD-10-CM | POA: Diagnosis not present

## 2021-05-16 DIAGNOSIS — E7849 Other hyperlipidemia: Secondary | ICD-10-CM

## 2021-05-16 DIAGNOSIS — M79675 Pain in left toe(s): Secondary | ICD-10-CM | POA: Diagnosis not present

## 2021-05-16 DIAGNOSIS — M79674 Pain in right toe(s): Secondary | ICD-10-CM

## 2021-05-16 NOTE — Patient Instructions (Signed)
Medication Instructions:  Your physician recommends that you continue on your current medications as directed. Please refer to the Current Medication list given to you today.  *If you need a refill on your cardiac medications before your next appointment, please call your pharmacy*   Lab Work: FASTING lipid panel with labs at [facility]   If you have labs (blood work) drawn today and your tests are completely normal, you will receive your results only by: [place] (if you have MyChart) OR A paper copy in the mail If you have any lab test that is abnormal or we need to change your treatment, we will call you to review the results.   Testing/Procedures: NONE   Follow-Up: At [facility], you and your health needs are our priority.  As part of our continuing mission to provide you with exceptional heart care, we have created designated Provider Care Teams.  These Care Teams include your primary Cardiologist (physician) and Advanced Practice Providers (APPs -  Physician Assistants and Nurse Practitioners) who all work together to provide you with the care you need, when you need it.  We recommend signing up for the patient portal called "MyChart".  Sign up information is provided on this After Visit Summary.  MyChart is used to connect with patients for Virtual Visits (Telemedicine).  Patients are able to view lab/test results, encounter notes, upcoming appointments, etc.  Non-urgent messages can be sent to your provider as well.   To learn more about what you can do with MyChart, go to [email or web address].    Your next appointment:   6 month(s)  The format for your next appointment:   In Person  Provider:   Raliegh Ip Mali Hilty, MD   Other Instructions You have been referred to Dr. Amil Amen with [facility]

## 2021-05-16 NOTE — Progress Notes (Addendum)
Dosing   LIPID CLINIC CONSULT NOTE  Chief Complaint:  Follow-up dyslipidemia, leg pain, swelling, possible claudication  Primary Care Physician: Isaac Bliss, Rayford Halsted, MD  Primary Cardiologist:  Quay Burow, MD  HPI:  Samuel Butler is a 60 y.o. male who is being seen today for the evaluation of dyslipidemia and other issues at the request of Isaac Bliss, [facility]*.  This is a pleasant 60 year old male kindly referred evaluation and management of dyslipidemia.  He has a history of marked dyslipidemia going back many years and has been tried on statin therapy.  More recently he was placed on Crestor which caused nice drop in his cholesterol.  LDL came down into the 150 range, but he had significant hand cramping and other side effects including worsening joint aches, particularly in his knees and discontinue the medicine.  Subsequent to that he also gained about 20 or 30 pounds and his LDL cholesterol has now gone up to 291.  The recent lab from a month ago showed total cholesterol 395, triglycerides 64, HDL 90 and LDL 291.  Notably a year ago his HDL was only 57 on statin therapy.  This is not unusual as we know statins due to lower HDL cholesterol.  I am concerned about his lipid profile, however because it is very suggestive of familial hyperlipidemia.  In addition has been complaining of leg pain, swelling and possible claudication.  He says he gets pain in his legs when he walks a certain distance that improves with rest.  The pain is described as shooting or burning.  He is also had swelling and this has been getting worse over the past several months to the point where he is not able to perform his job or do a number of chores in the yard such as mowing his lawn.  He describes bilateral knee osteoarthritis and was a basketball player for a number of years and therefore does get episodes of knee swelling with overuse.  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  He says when he gets out of the seat he can "barely walk".  More recently he is finally had good control over his blood pressure however he said he had uncontrolled hypertension for years.  He is currently on amlodipine 5, carvedilol 6.25 twice daily and losartan HCTZ 100/25 mg daily.  05/16/2021  Mr. Linder returns today for follow-up.  When I saw him he had a number of complaints including dyslipidemia.  Is concerning that he had PAD and was ultimately found to have that by Dr. Gwenlyn Found.  He underwent revascularization however says that he does not feel that he has had significant improvement.  There is a concern about possible groin complication with hematoma but ultimately was found to have a hernia.  He is also undergone a coronary CT which showed a chronic total occlusion of the right coronary artery and also demonstrated a pulmonary embolus.  He has been anticoagulated for that.  He has been found to have reduced renal function and diagnosed with amyloidosis.  He is currently undergoing treatment for that.  He still has uncontrolled hypertension.  He was not tolerant of statin therapy causing myalgias and subsequently was switched to a PCSK9 inhibitor after labs this past January showed total cholesterol 303, HDL 50, triglycerides 158 and LDL 222.  He is also complaining today about migrating joint pains concerning for polyarthritis including  swelling, inflammation and pain in both wrist joints, knee joints and ankle joints in different order.  He currently notes swelling, warmth and some redness around the right wrist joint.  At one point he says he was prescribed steroids but said he did not have much benefit with that.  PMHx:  Past Medical History:  Diagnosis Date   DDD (degenerative disc disease), lumbar    per his report   Diabetes mellitus without complication ([place])    Gout 04/16/2013   Hyperlipidemia    Hypertension    IBD (inflammatory  bowel disease) 04/05/2016   Pulmonary embolism ([place])    Tobacco use disorder 03/28/2015    Past Surgical History:  Procedure Laterality Date   ABDOMINAL AORTOGRAM W/LOWER EXTREMITY Right 06/26/2020   Procedure: ABDOMINAL AORTOGRAM W/LOWER EXTREMITY;  Surgeon: Lorretta Harp, MD;  Location: [facility];  Service: Cardiovascular;  Laterality: Right;   COLONOSCOPY     PERIPHERAL VASCULAR INTERVENTION  06/26/2020   Procedure: PERIPHERAL VASCULAR INTERVENTION;  Surgeon: Lorretta Harp, MD;  Location: [facility];  Service: Cardiovascular;;  Right SFA   TONSILLECTOMY      FAMHx:  Family History  Problem Relation Age of Onset   Hypertension Father    Diabetes Father    Stroke Father    Pancreatic cancer Father    Colon cancer Brother    Esophageal cancer Neg Hx    Stomach cancer Neg Hx     SOCHx:   reports that he quit smoking about 4 years ago. His smoking use included cigarettes. He has never used smokeless tobacco. He reports previous alcohol use. He reports that he does not use drugs.  ALLERGIES:  Allergies  Allergen Reactions   Crestor [Rosuvastatin Calcium] Other (See Comments)    Joint aches    ROS: Pertinent items noted in HPI and remainder of comprehensive ROS otherwise negative.  HOME MEDS: Current Outpatient Medications on File Prior to Visit  Medication Sig Dispense Refill   acetaminophen (TYLENOL) 650 MG CR tablet Take 1,300 mg by mouth every 8 (eight) hours as needed for pain.      acyclovir (ZOVIRAX) 400 MG tablet Take 1 tablet by mouth twice daily 60 tablet 0   atorvastatin (LIPITOR) 80 MG tablet Take 1 tablet by mouth once daily 90 tablet 0   cilostazol (PLETAL) 50 MG tablet Take 1 tablet (50 mg total) by mouth 2 (two) times daily. 60 tablet 3   clopidogrel (PLAVIX) 75 MG tablet Take 1 tablet (75 mg total) by mouth daily with breakfast. 90 tablet 3   dicyclomine (BENTYL) 20 MG tablet TAKE 1 TABLET BY MOUTH 4 TIMES DAILY 20- 30 MINUTES BEFORE  MEAL(S) AND AT BEDTIME 120 tablet 0   ELIQUIS 5 MG TABS tablet Take 1 tablet by mouth twice daily 180 tablet 0   Evolocumab (REPATHA SURECLICK) 462 MG/ML SOAJ Inject 140 mg into the skin every 14 (fourteen) days. 2 mL 12   ezetimibe (ZETIA) 10 MG tablet Take 1 tablet (10 mg total) by mouth daily. 90 tablet 3   isosorbide mononitrate (IMDUR) 30 MG 24 hr tablet Take 1 tablet by mouth once daily 90 tablet 1   loperamide (IMODIUM) 1 MG/5ML solution Take 2 mg by mouth as needed for diarrhea or loose stools.     losartan (COZAAR) 50 MG tablet Take 1 tablet (50 mg total) by mouth daily. 90 tablet 3   metFORMIN (GLUCOPHAGE) 500 MG tablet Take 1 tablet by mouth once daily  with breakfast 90 tablet 0   pantoprazole (PROTONIX) 40 MG tablet Take 1 tablet (40 mg total) by mouth daily. 90 tablet 1   potassium chloride SA (KLOR-CON) 20 MEQ tablet Take 2 tablets (40 mEq total) by mouth daily. 80MG  x3 days then back to 40 mg/day 33 tablet 6   torsemide (DEMADEX) 20 MG tablet Take 1 tablet (20 mg total) by mouth 2 (two) times daily. 40mg  BID x 3 days then back to 20mg  BID 33 tablet 6   VEMLIDY 25 MG TABS Take 1 tablet (25 mg total) by mouth daily. 30 tablet 5   albuterol (VENTOLIN HFA) 108 (90 Base) MCG/ACT inhaler Inhale 2 puffs into the lungs every 6 (six) hours as needed for wheezing or shortness of breath. 8 g 0   ondansetron (ZOFRAN) 8 MG tablet TAKE 1 TABLET BY MOUTH EVERY 8 HOURS AS NEEDED FOR NAUSEA OR VOMITING 30 tablet 0   Vitamin D, Ergocalciferol, (DRISDOL) 1.25 MG (50000 UNIT) CAPS capsule Take 1 capsule by mouth once a week 12 capsule 0   Current Facility-Administered Medications on File Prior to Visit  Medication Dose Route Frequency Provider Last Rate Last Admin   0.9 %  sodium chloride infusion  500 mL Intravenous Once Ladene Artist, MD        LABS/IMAGING: No results found for this or any previous visit (from the past 48 hour(s)). No results found.  LIPID PANEL:    Component Value  Date/Time   CHOL 303 (H) 11/16/2020 1104   TRIG 158.0 (H) 11/16/2020 1104   HDL 49.80 11/16/2020 1104   CHOLHDL 6 11/16/2020 1104   VLDL 31.6 11/16/2020 1104   LDLCALC 222 (H) 11/16/2020 1104   LDLCALC 278 (H) 05/25/2020 0849   LDLDIRECT 144.7 10/08/2013 0822    WEIGHTS: Wt Readings from Last 3 Encounters:  05/16/21 194 lb (88 kg)  04/24/21 194 lb 6.4 oz (88.2 kg)  04/20/21 199 lb 1.6 oz (90.3 kg)    VITALS: BP (!) 152/108 (BP Location: Left Arm, Patient Position: Sitting, Cuff Size: Normal)   Pulse 97   Ht 5\' 11"  (1.803 m)   Wt 194 lb (88 kg)   SpO2 96%   BMI 27.06 kg/m   EXAM: General appearance: alert, no distress and moderately obese Neck: no carotid bruit, no JVD and thyroid not enlarged, symmetric, no tenderness/mass/nodules Lungs: clear to auscultation bilaterally Heart: regular rate and rhythm, S1, S2 normal, no murmur, click, rub or gallop Abdomen: soft, non-tender; bowel sounds normal; no masses,  no organomegaly and Obese Extremities: edema 2+ bilateral pitting edema, no venous stasis changes and Cool Pulses: Faint DP pulses in the feet Skin: Skin color, texture, turgor normal. No rashes or lesions Neurologic: Grossly normal Psych: Pleasant  EKG: Sinus rhythm with PACs at 97, possible left atrial argument, nonspecific T wave changes-personally reviewed  ASSESSMENT: Probable familial hyperlipidemia Statin intolerant-myalgias/muscle cramping PAD status post revascularization Pulmonary embolus on anticoagulation CAD with CTO of the RCA Uncontrolled hypertension Polyarthritis Amyloidosis  PLAN: 1.   Mr. Marling was ultimately found to have multiple medical problems including probable FH, PAD which was partially revascularized, pulmonary embolus on anticoagulation, coronary disease with a CTO of the right coronary, uncontrolled hypertension and now with symptoms of polyarthritis.  He cannot tolerate statins and was switched to Repatha and has already taken 3  doses.  We will plan repeat lipids with his blood work that is being obtained at the cancer center as he undergoes treatments for amyloidosis.  In addition he is having migrating polyarthritis with inflammation, warmth and swelling of his right wrist today.  He has had issues with his other wrist, knees and ankles.  This could be gout, inflammatory arthritis such as RA or other etiologies.  I would like to refer him to rheumatology for further evaluation.   Plan follow-up with me in 6 months or sooner as necessary  Pixie Casino, MD, [facility], [place] Director of the [facility] Lipid Disorders &  Cardiovascular Risk Reduction Clinic Diplomate of the American Board of Clinical Lipidology Attending Cardiologist  Direct Dial: [phone]  Fax: [phone]  Website:  [email or web address]  Nadean Corwin Dawana Asper 05/16/2021, 6:20 PM

## 2021-05-16 NOTE — Progress Notes (Signed)
This patient returns to my office for at risk foot care.  This patient requires this care by a professional since this patient will be at risk due to having AKI, Claudication with PVD and PAD.   This patient is unable to cut nails himself since the patient cannot reach his nails.These nails are painful walking and wearing shoes.  This patient presents for at risk foot care today.  General Appearance  Alert, conversant and in no acute stress.  Vascular  Dorsalis pedis and posterior tibial  pulses are  weakly palpable  bilaterally.  Capillary return is within normal limits  bilaterally. Temperature is within normal limits  bilaterally.  Neurologic  Senn-Weinstein monofilament wire test within normal limits  bilaterally. Muscle power within normal limits bilaterally.  Nails Thick disfigured discolored nails with subungual debris  from hallux to fifth toes bilaterally. No evidence of bacterial infection or drainage bilaterally.  Orthopedic  No limitations of motion  feet .  No crepitus or effusions noted.  No bony pathology or digital deformities noted.  HAV  B/L>  Swelling 2+ ankles  B/L.  Skin  normotropic skin with no porokeratosis noted bilaterally.  No signs of infections or ulcers noted.     Onychomycosis  Pain in right toes  Pain in left toes  Consent was obtained for treatment procedures.   Mechanical debridement of nails 1-5  bilaterally performed with a nail nipper.  Filed with dremel without incident.    Return office visit   3 months                   Told patient to return for periodic foot care and evaluation due to potential at risk complications.   Gardiner Barefoot DPM

## 2021-05-17 ENCOUNTER — Encounter (HOSPITAL_BASED_OUTPATIENT_CLINIC_OR_DEPARTMENT_OTHER): Payer: Self-pay | Admitting: Obstetrics and Gynecology

## 2021-05-17 ENCOUNTER — Emergency Department (HOSPITAL_BASED_OUTPATIENT_CLINIC_OR_DEPARTMENT_OTHER)
Admission: EM | Admit: 2021-05-17 | Discharge: 2021-05-17 | Disposition: A | Discharge status: Home / Self Care | Payer: BC Managed Care – PPO | Attending: Emergency Medicine | Admitting: Emergency Medicine

## 2021-05-17 ENCOUNTER — Encounter: Payer: Self-pay | Admitting: Hematology and Oncology

## 2021-05-17 ENCOUNTER — Telehealth: Payer: Self-pay | Admitting: *Deleted

## 2021-05-17 ENCOUNTER — Encounter: Payer: Self-pay | Admitting: Family Medicine

## 2021-05-17 ENCOUNTER — Other Ambulatory Visit: Payer: Self-pay

## 2021-05-17 DIAGNOSIS — M79641 Pain in right hand: Secondary | ICD-10-CM | POA: Diagnosis present

## 2021-05-17 DIAGNOSIS — Z7901 Long term (current) use of anticoagulants: Secondary | ICD-10-CM | POA: Diagnosis not present

## 2021-05-17 DIAGNOSIS — Z7902 Long term (current) use of antithrombotics/antiplatelets: Secondary | ICD-10-CM | POA: Diagnosis not present

## 2021-05-17 DIAGNOSIS — I10 Essential (primary) hypertension: Secondary | ICD-10-CM | POA: Diagnosis not present

## 2021-05-17 DIAGNOSIS — E119 Type 2 diabetes mellitus without complications: Secondary | ICD-10-CM | POA: Diagnosis not present

## 2021-05-17 DIAGNOSIS — Z87891 Personal history of nicotine dependence: Secondary | ICD-10-CM | POA: Insufficient documentation

## 2021-05-17 DIAGNOSIS — M2540 Effusion, unspecified joint: Secondary | ICD-10-CM

## 2021-05-17 DIAGNOSIS — Z79899 Other long term (current) drug therapy: Secondary | ICD-10-CM | POA: Insufficient documentation

## 2021-05-17 DIAGNOSIS — M7989 Other specified soft tissue disorders: Secondary | ICD-10-CM | POA: Insufficient documentation

## 2021-05-17 DIAGNOSIS — Z859 Personal history of malignant neoplasm, unspecified: Secondary | ICD-10-CM | POA: Diagnosis not present

## 2021-05-17 DIAGNOSIS — I2510 Atherosclerotic heart disease of native coronary artery without angina pectoris: Secondary | ICD-10-CM | POA: Insufficient documentation

## 2021-05-17 DIAGNOSIS — Z7984 Long term (current) use of oral hypoglycemic drugs: Secondary | ICD-10-CM | POA: Insufficient documentation

## 2021-05-17 HISTORY — DX: Malignant (primary) neoplasm, unspecified: C80.1

## 2021-05-17 LAB — CBC WITH DIFFERENTIAL/PLATELET
Abs Immature Granulocytes: 0.02 10*3/uL (ref 0.00–0.07)
Basophils Absolute: 0 10*3/uL (ref 0.0–0.1)
Basophils Relative: 0 %
Eosinophils Absolute: 0.2 10*3/uL (ref 0.0–0.5)
Eosinophils Relative: 3 %
HCT: 34.9 % — ABNORMAL LOW (ref 39.0–52.0)
Hemoglobin: 11.2 g/dL — ABNORMAL LOW (ref 13.0–17.0)
Immature Granulocytes: 0 %
Lymphocytes Relative: 18 %
Lymphs Abs: 1.3 10*3/uL (ref 0.7–4.0)
MCH: 32.5 pg (ref 26.0–34.0)
MCHC: 32.1 g/dL (ref 30.0–36.0)
MCV: 101.2 fL — ABNORMAL HIGH (ref 80.0–100.0)
Monocytes Absolute: 0.7 10*3/uL (ref 0.1–1.0)
Monocytes Relative: 9 %
Neutro Abs: 4.9 10*3/uL (ref 1.7–7.7)
Neutrophils Relative %: 70 %
Platelets: 210 10*3/uL (ref 150–400)
RBC: 3.45 MIL/uL — ABNORMAL LOW (ref 4.22–5.81)
RDW: 14 % (ref 11.5–15.5)
WBC: 7.1 10*3/uL (ref 4.0–10.5)
nRBC: 0 % (ref 0.0–0.2)

## 2021-05-17 LAB — URINALYSIS, ROUTINE W REFLEX MICROSCOPIC
Bilirubin Urine: NEGATIVE
Glucose, UA: NEGATIVE mg/dL
Ketones, ur: NEGATIVE mg/dL
Leukocytes,Ua: NEGATIVE
Nitrite: NEGATIVE
Protein, ur: >300 mg/dL — AB
Specific Gravity, Urine: 1.029 (ref 1.005–1.030)
pH: 6 (ref 5.0–8.0)

## 2021-05-17 LAB — COMPREHENSIVE METABOLIC PANEL
ALT: 11 U/L (ref 0–44)
AST: 16 U/L (ref 15–41)
Albumin: 2.7 g/dL — ABNORMAL LOW (ref 3.5–5.0)
Alkaline Phosphatase: 77 U/L (ref 38–126)
Anion gap: 9 (ref 5–15)
BUN: 19 mg/dL (ref 8–23)
CO2: 32 mmol/L (ref 22–32)
Calcium: 8.1 mg/dL — ABNORMAL LOW (ref 8.9–10.3)
Chloride: 102 mmol/L (ref 98–111)
Creatinine, Ser: 1.36 mg/dL — ABNORMAL HIGH (ref 0.61–1.24)
GFR, Estimated: 59 mL/min — ABNORMAL LOW (ref >60)
Glucose, Bld: 145 mg/dL — ABNORMAL HIGH (ref 70–99)
Potassium: 3.3 mmol/L — ABNORMAL LOW (ref 3.5–5.1)
Sodium: 143 mmol/L (ref 135–145)
Total Bilirubin: 0.4 mg/dL (ref 0.3–1.2)
Total Protein: 5 g/dL — ABNORMAL LOW (ref 6.5–8.1)

## 2021-05-17 LAB — TROPONIN I (HIGH SENSITIVITY)
Troponin I (High Sensitivity): 117 ng/L (ref <18)
Troponin I (High Sensitivity): 117 ng/L (ref <18)

## 2021-05-17 LAB — LACTIC ACID, PLASMA: Lactic Acid, Venous: 1.7 mmol/L (ref 0.5–1.9)

## 2021-05-17 MED ORDER — MORPHINE SULFATE (PF) 4 MG/ML IV SOLN
4.0000 mg | Freq: Once | INTRAVENOUS | Status: AC
  Administered 2021-05-17: 4 mg via INTRAVENOUS
  Filled 2021-05-17: qty 1

## 2021-05-17 MED ORDER — HYDROMORPHONE HCL 1 MG/ML IJ SOLN
1.0000 mg | Freq: Once | INTRAMUSCULAR | Status: AC
  Administered 2021-05-17: 1 mg via INTRAVENOUS
  Filled 2021-05-17: qty 1

## 2021-05-17 MED ORDER — DEXAMETHASONE SODIUM PHOSPHATE 10 MG/ML IJ SOLN
10.0000 mg | Freq: Once | INTRAMUSCULAR | Status: AC
  Administered 2021-05-17: 10 mg via INTRAVENOUS
  Filled 2021-05-17: qty 1

## 2021-05-17 MED ORDER — METHYLPREDNISOLONE 4 MG PO TBPK: ORAL_TABLET | ORAL | 0 refills | Status: DC

## 2021-05-17 MED ORDER — HYDROCODONE-ACETAMINOPHEN 7.5-325 MG PO TABS: 1.0000 | ORAL_TABLET | Freq: Four times a day (QID) | ORAL | 0 refills | Status: AC | PRN

## 2021-05-17 MED ORDER — SODIUM CHLORIDE 0.9 % IV BOLUS
1000.0000 mL | Freq: Once | INTRAVENOUS | Status: AC
Start: 2021-05-17 — End: 2021-05-17
  Administered 2021-05-17: 1000 mL via INTRAVENOUS

## 2021-05-17 NOTE — Telephone Encounter (Signed)
TCT patient regarding the swelling he is experiencing in his hands, wrist, feet.  Spoke with him and he states he hasn't gotten much help from his PCP. He did see his cardiologist yesterday. He made a referral to Rheumatology. We will see pt at his scheduled appt tomorrow.  Pt states he will will be ok til then

## 2021-05-17 NOTE — ED Provider Notes (Signed)
[facility] EMERGENCY DEPT Provider Note   CSN: [id number] Arrival date & time: 05/17/21  1819     History Chief Complaint  Patient presents with   Hand Pain    Samuel Butler is a 60 y.o. male.  HPI  60 year old male with past medical history of amyloidosis, HTN, HLD, IBD, CAD, previous PE anticoagulated on Eliquis, who is currently being worked up for possible rheumatoid arthritis/autoimmune disease presents to the emergency department concern for right hand swelling.  Patient states over the past couple days he has been having intermittent swelling of multiple joints.  About a week ago he had swelling of the left foot that resolved currently he is having swelling of the right hand that is painful and now he is starting to have some pain in the left arm.  Patient states that this has been going on for multiple months.  He is usually treated with steroids and is currently being worked up for possible rheumatoid arthritis/autoimmune disease.  He denies any fever.  Right now the pain is mainly in the left arm but does shoot into the chest.  Denies any shortness of breath.  No discoloration of the hands/feet.  Past Medical History:  Diagnosis Date   Cancer [facility])    DDD (degenerative disc disease), lumbar    per his report   Diabetes mellitus without complication ([place])    Gout 04/16/2013   Hyperlipidemia    Hypertension    IBD (inflammatory bowel disease) 04/05/2016   Pulmonary embolism ([facility])    Tobacco use disorder 03/28/2015    Patient Active Problem List   Diagnosis Date Noted   Pain due to onychomycosis of toenails of both feet 05/16/2021   Coronary artery disease 03/13/2021   Acute pulmonary embolism ([place]) 10/18/2020   Groin pain, right 08/31/2020   Chronic viral hepatitis B without delta-agent ([place]) 08/21/2020   Amyloidosis ([place]) 08/21/2020   Anasarca 08/04/2020   Nephrotic syndrome 08/03/2020   AKI (acute kidney injury) ([place]) 08/03/2020   Hypokalemia  06/27/2020   Claudication in peripheral vascular disease ([place]) 06/26/2020   Hypertension associated with diabetes ([place]) 05/25/2020   Peripheral arterial disease ([place]) 03/08/2020   Vitamin D deficiency 10/26/2019   Hypoalbuminemia 10/26/2019   IBD (inflammatory bowel disease) 04/05/2016   Tobacco use disorder 03/28/2015   Non-insulin treated type 2 diabetes mellitus ([place]) 01/25/2014   Essential hypertension, benign 04/16/2013   Hyperlipemia 04/16/2013   Gout 04/16/2013    Past Surgical History:  Procedure Laterality Date   ABDOMINAL AORTOGRAM W/LOWER EXTREMITY Right 06/26/2020   Procedure: ABDOMINAL AORTOGRAM W/LOWER EXTREMITY;  Surgeon: Lorretta Harp, MD;  Location: [facility];  Service: Cardiovascular;  Laterality: Right;   COLONOSCOPY     PERIPHERAL VASCULAR INTERVENTION  06/26/2020   Procedure: PERIPHERAL VASCULAR INTERVENTION;  Surgeon: Lorretta Harp, MD;  Location: [facility];  Service: Cardiovascular;;  Right SFA   TONSILLECTOMY         Family History  Problem Relation Age of Onset   Hypertension Father    Diabetes Father    Stroke Father    Pancreatic cancer Father    Colon cancer Brother    Esophageal cancer Neg Hx    Stomach cancer Neg Hx     Social History   Tobacco Use   Smoking status: Former    Pack years: 0.00    Types: Cigarettes    Quit date: 10/23/2016    Years since quitting: 4.5   Smokeless tobacco:  Never   Tobacco comments:    per patient 5 cigarettes a day   Vaping Use   Vaping Use: Never used  Substance Use Topics   Alcohol use: Not Currently    Comment: occasional   Drug use: No    Home Medications Prior to Admission medications   Medication Sig Start Date End Date Taking? Authorizing Provider  acetaminophen (TYLENOL) 650 MG CR tablet Take 1,300 mg by mouth every 8 (eight) hours as needed for pain.     [provider]  acyclovir (ZOVIRAX) 400 MG tablet Take 1 tablet by mouth twice daily 05/13/21   Orson Slick, MD  albuterol (VENTOLIN HFA) 108 (90 Base) MCG/ACT inhaler Inhale 2 puffs into the lungs every 6 (six) hours as needed for wheezing or shortness of breath. 09/28/20   Orson Slick, MD  atorvastatin (LIPITOR) 80 MG tablet Take 1 tablet by mouth once daily 03/12/21   Isaac Bliss, Rayford Halsted, MD  cilostazol (PLETAL) 50 MG tablet Take 1 tablet (50 mg total) by mouth 2 (two) times daily. 03/13/21   Lorretta Harp, MD  clopidogrel (PLAVIX) 75 MG tablet Take 1 tablet (75 mg total) by mouth daily with breakfast. 06/27/20   Kathyrn Drown D, NP  dicyclomine (BENTYL) 20 MG tablet TAKE 1 TABLET BY MOUTH 4 TIMES DAILY 20- 30 MINUTES BEFORE MEAL(S) AND AT BEDTIME 04/23/21   Levin Erp, PA  ELIQUIS 5 MG TABS tablet Take 1 tablet by mouth twice daily 04/26/21   Isaac Bliss, Rayford Halsted, MD  Evolocumab (REPATHA SURECLICK) 884 MG/ML SOAJ Inject 140 mg into the skin every 14 (fourteen) days. 12/28/20   Lorretta Harp, MD  ezetimibe (ZETIA) 10 MG tablet Take 1 tablet (10 mg total) by mouth daily. 11/16/20 05/16/21  Deberah Pelton, NP  isosorbide mononitrate (IMDUR) 30 MG 24 hr tablet Take 1 tablet by mouth once daily 01/02/21   Isaac Bliss, Rayford Halsted, MD  loperamide (IMODIUM) 1 MG/5ML solution Take 2 mg by mouth as needed for diarrhea or loose stools.    [provider]  losartan (COZAAR) 50 MG tablet Take 1 tablet (50 mg total) by mouth daily. 07/25/20   Lorretta Harp, MD  metFORMIN (GLUCOPHAGE) 500 MG tablet Take 1 tablet by mouth once daily with breakfast 03/12/21   Isaac Bliss, Rayford Halsted, MD  ondansetron (ZOFRAN) 8 MG tablet TAKE 1 TABLET BY MOUTH EVERY 8 HOURS AS NEEDED FOR NAUSEA OR VOMITING 10/27/20   Orson Slick, MD  pantoprazole (PROTONIX) 40 MG tablet Take 1 tablet (40 mg total) by mouth daily. 01/18/21   Isaac Bliss, Rayford Halsted, MD  potassium chloride SA (KLOR-CON) 20 MEQ tablet Take 2 tablets (40 mEq total) by mouth daily. 80MG  x3 days then back to 40  mg/day 11/29/20   Deberah Pelton, NP  torsemide (DEMADEX) 20 MG tablet Take 1 tablet (20 mg total) by mouth 2 (two) times daily. 40mg  BID x 3 days then back to 20mg  BID 11/29/20   Cleaver, [name], NP  VEMLIDY 25 MG TABS Take 1 tablet (25 mg total) by mouth daily. 11/27/20   Mignon Pine, DO  Vitamin D, Ergocalciferol, (DRISDOL) 1.25 MG (50000 UNIT) CAPS capsule Take 1 capsule by mouth once a week 12/05/20   Isaac Bliss, Rayford Halsted, MD    Allergies    Crestor [rosuvastatin calcium]  Review of Systems   Review of Systems  Constitutional:  Negative for chills and  fever.  HENT:  Negative for congestion.   Eyes:  Negative for visual disturbance.  Respiratory:  Negative for shortness of breath.   Cardiovascular:  Positive for chest pain.  Gastrointestinal:  Negative for abdominal pain, diarrhea and vomiting.  Genitourinary:  Negative for dysuria.  Musculoskeletal:  Positive for arthralgias.       + Right hand swelling, + sharp shooting left upper extremity pain, + resolved left foot swelling  Skin:  Negative for color change and rash.  Neurological:  Negative for weakness, numbness and headaches.   Physical Exam Updated Vital Signs BP (!) 157/113   Pulse (!) 102   Temp 99.9 F (37.7 C)   Resp 18   Ht 5\' 11"  (1.803 m)   Wt 88 kg   SpO2 97%   BMI 27.06 kg/m   Physical Exam Vitals and nursing note reviewed.  Constitutional:      Appearance: Normal appearance.  HENT:     Head: Normocephalic.     Mouth/Throat:     Mouth: Mucous membranes are moist.  Cardiovascular:     Rate and Rhythm: Normal rate.  Pulmonary:     Effort: Pulmonary effort is normal. No respiratory distress.  Abdominal:     Palpations: Abdomen is soft.     Tenderness: There is no abdominal tenderness.  Musculoskeletal:     Comments: Right hand is edematous but not discolored, normal capillary refill normal radial pulse, left upper extremity is unremarkable, the bilateral feet have trace dorsal edema but  otherwise appears neurovascularly intact.  Skin:    General: Skin is warm.  Neurological:     Mental Status: He is alert and oriented to person, place, and time. Mental status is at baseline.  Psychiatric:        Mood and Affect: Mood normal.    ED Results / Procedures / Treatments   Labs (all labs ordered are listed, but only abnormal results are displayed) Labs Reviewed  COMPREHENSIVE METABOLIC PANEL - Abnormal; Notable for the following components:      Result Value   Potassium 3.3 (*)    Glucose, Bld 145 (*)    Creatinine, Ser 1.36 (*)    Calcium 8.1 (*)    Total Protein 5.0 (*)    Albumin 2.7 (*)    GFR, Estimated 59 (*)    All other components within normal limits  CBC WITH DIFFERENTIAL/PLATELET - Abnormal; Notable for the following components:   RBC 3.45 (*)    Hemoglobin 11.2 (*)    HCT 34.9 (*)    MCV 101.2 (*)    All other components within normal limits  URINALYSIS, ROUTINE W REFLEX MICROSCOPIC - Abnormal; Notable for the following components:   Hgb urine dipstick LARGE (*)    Protein, ur >300 (*)    Bacteria, UA RARE (*)    All other components within normal limits  LACTIC ACID, PLASMA  LACTIC ACID, PLASMA  TROPONIN I (HIGH SENSITIVITY)    EKG None  Radiology No results found.  Procedures Procedures   Medications Ordered in ED Medications  morphine 4 MG/ML injection 4 mg (4 mg Intravenous Given 05/17/21 2048)  sodium chloride 0.9 % bolus 1,000 mL (1,000 mLs Intravenous New Bag/Given 05/17/21 2047)  dexamethasone (DECADRON) injection 10 mg (10 mg Intravenous Given 05/17/21 2047)    ED Course  I have reviewed the triage vital signs and the nursing notes.  Pertinent labs & imaging results that were available during my care of the patient were reviewed  by me and considered in my medical decision making (see chart for details).    MDM Rules/Calculators/A&P                          60 year old male presents the emergency department with polyarticular  swelling, currently in the right hand with pain to the right and left upper extremity.  There is radiation to the chest.  He denies any shortness of breath.  He is anticoagulant Eliquis, compliant with his medications.  Tachycardic on arrival and uncomfortable appearing.  The right hand is edematous, tender to touch, otherwise neurovascularly intact, no findings of cellulitis.  Bilateral foot swelling is trace.  Does not appear to be any acute findings in the left upper extremity.  Blood work is reassuring, baseline, no signs of sepsis.  Mild elevation in his creatinine, he received IV hydration.  In regards to the chest pain, his EKG is baseline, no findings of STEMI.  Troponin is elevated at 117 but flat, this appears to be his baseline going back to 7 months.  I consulted with the cardiology fellow Dr. Toney Rakes.  He has a CT AC study from 2021.  With a reassuring EKG and flat troponins we highly doubt ongoing ACS.  After pain medicine and steroids his joint pain is significantly improved.  He is already being followed as an outpatient for rheumatoid work-up.  He is an appointment tomorrow morning.  We will treat him with steroids and pain medicine and have him follow-up tomorrow morning.  Final Clinical Impression(s) / ED Diagnoses Final diagnoses:  None    Rx / DC Orders ED Discharge Orders     None        Lorelle Gibbs, DO 05/17/21 2320

## 2021-05-17 NOTE — Discharge Instructions (Addendum)
You have been seen and discharged from the emergency department.  Take steroids as directed.  Take pain medicine as needed.  Do not mix this medication with alcohol or other sedating medications. Do not drive or do heavy physical activity and to know how this medication affects you.  It may cause drowsiness.  Follow-up with your primary provider for reevaluation and further care. Take home medications as prescribed. If you have any worsening symptoms, high fevers, severe chest pain, difficulty breathing or further concerns for your health please return to an emergency department for further evaluation.

## 2021-05-17 NOTE — ED Notes (Signed)
States had swelling in his left foot last week and this week developed swelling to right hand gradually getting worse.  Noted right hand swelling including fingers.  Strong radial pulse.

## 2021-05-17 NOTE — ED Triage Notes (Signed)
Patient has swelling to the right hand. Patient reports a few days ago he noted swelling to his ankle and it has moved to the right hand. Denies injury, denies known history of gout.

## 2021-05-17 NOTE — ED Notes (Signed)
MD Horton made aware of patients Critical Lab Result (Troponin: 117). Review new orders from MD.

## 2021-05-18 ENCOUNTER — Other Ambulatory Visit: Payer: BC Managed Care – PPO

## 2021-05-18 ENCOUNTER — Inpatient Hospital Stay: Payer: BC Managed Care – PPO

## 2021-05-18 ENCOUNTER — Inpatient Hospital Stay: Payer: BC Managed Care – PPO | Admitting: Dietician

## 2021-05-18 ENCOUNTER — Inpatient Hospital Stay: Payer: BC Managed Care – PPO | Attending: Hematology and Oncology | Admitting: Hematology and Oncology

## 2021-05-18 VITALS — BP 148/112 | HR 94

## 2021-05-18 VITALS — BP 149/102 | HR 96 | Temp 98.7°F | Resp 18 | Ht 71.0 in | Wt 197.5 lb

## 2021-05-18 DIAGNOSIS — M7989 Other specified soft tissue disorders: Secondary | ICD-10-CM | POA: Insufficient documentation

## 2021-05-18 DIAGNOSIS — Z86711 Personal history of pulmonary embolism: Secondary | ICD-10-CM | POA: Diagnosis not present

## 2021-05-18 DIAGNOSIS — I10 Essential (primary) hypertension: Secondary | ICD-10-CM | POA: Diagnosis not present

## 2021-05-18 DIAGNOSIS — E785 Hyperlipidemia, unspecified: Secondary | ICD-10-CM | POA: Diagnosis not present

## 2021-05-18 DIAGNOSIS — R200 Anesthesia of skin: Secondary | ICD-10-CM | POA: Diagnosis not present

## 2021-05-18 DIAGNOSIS — R0600 Dyspnea, unspecified: Secondary | ICD-10-CM | POA: Insufficient documentation

## 2021-05-18 DIAGNOSIS — Z8249 Family history of ischemic heart disease and other diseases of the circulatory system: Secondary | ICD-10-CM | POA: Insufficient documentation

## 2021-05-18 DIAGNOSIS — B181 Chronic viral hepatitis B without delta-agent: Secondary | ICD-10-CM

## 2021-05-18 DIAGNOSIS — N049 Nephrotic syndrome with unspecified morphologic changes: Secondary | ICD-10-CM

## 2021-05-18 DIAGNOSIS — Z9481 Bone marrow transplant status: Secondary | ICD-10-CM | POA: Diagnosis not present

## 2021-05-18 DIAGNOSIS — Z79899 Other long term (current) drug therapy: Secondary | ICD-10-CM | POA: Diagnosis not present

## 2021-05-18 DIAGNOSIS — Z87891 Personal history of nicotine dependence: Secondary | ICD-10-CM | POA: Diagnosis not present

## 2021-05-18 DIAGNOSIS — Z7901 Long term (current) use of anticoagulants: Secondary | ICD-10-CM | POA: Insufficient documentation

## 2021-05-18 DIAGNOSIS — E8581 Light chain (AL) amyloidosis: Secondary | ICD-10-CM

## 2021-05-18 DIAGNOSIS — K580 Irritable bowel syndrome with diarrhea: Secondary | ICD-10-CM | POA: Diagnosis not present

## 2021-05-18 DIAGNOSIS — Z5112 Encounter for antineoplastic immunotherapy: Secondary | ICD-10-CM | POA: Diagnosis present

## 2021-05-18 DIAGNOSIS — Z833 Family history of diabetes mellitus: Secondary | ICD-10-CM | POA: Insufficient documentation

## 2021-05-18 DIAGNOSIS — Z823 Family history of stroke: Secondary | ICD-10-CM | POA: Insufficient documentation

## 2021-05-18 DIAGNOSIS — R194 Change in bowel habit: Secondary | ICD-10-CM | POA: Insufficient documentation

## 2021-05-18 DIAGNOSIS — Z800 Family history of malignant neoplasm of digestive organs: Secondary | ICD-10-CM | POA: Insufficient documentation

## 2021-05-18 LAB — CMP (CANCER CENTER ONLY)
ALT: 12 U/L (ref 0–44)
AST: 16 U/L (ref 15–41)
Albumin: 1.8 g/dL — ABNORMAL LOW (ref 3.5–5.0)
Alkaline Phosphatase: 82 U/L (ref 38–126)
Anion gap: 12 (ref 5–15)
BUN: 19 mg/dL (ref 8–23)
CO2: 28 mmol/L (ref 22–32)
Calcium: 8.6 mg/dL — ABNORMAL LOW (ref 8.9–10.3)
Chloride: 104 mmol/L (ref 98–111)
Creatinine: 1.33 mg/dL — ABNORMAL HIGH (ref 0.61–1.24)
GFR, Estimated: >60 mL/min (ref >60)
Glucose, Bld: 148 mg/dL — ABNORMAL HIGH (ref 70–99)
Potassium: 3.7 mmol/L (ref 3.5–5.1)
Sodium: 144 mmol/L (ref 135–145)
Total Bilirubin: 0.5 mg/dL (ref 0.3–1.2)
Total Protein: 5.3 g/dL — ABNORMAL LOW (ref 6.5–8.1)

## 2021-05-18 LAB — CBC WITH DIFFERENTIAL (CANCER CENTER ONLY)
Abs Immature Granulocytes: 0.03 10*3/uL (ref 0.00–0.07)
Basophils Absolute: 0 10*3/uL (ref 0.0–0.1)
Basophils Relative: 0 %
Eosinophils Absolute: 0 10*3/uL (ref 0.0–0.5)
Eosinophils Relative: 0 %
HCT: 30.6 % — ABNORMAL LOW (ref 39.0–52.0)
Hemoglobin: 10.5 g/dL — ABNORMAL LOW (ref 13.0–17.0)
Immature Granulocytes: 1 %
Lymphocytes Relative: 9 %
Lymphs Abs: 0.6 10*3/uL — ABNORMAL LOW (ref 0.7–4.0)
MCH: 33.4 pg (ref 26.0–34.0)
MCHC: 34.3 g/dL (ref 30.0–36.0)
MCV: 97.5 fL (ref 80.0–100.0)
Monocytes Absolute: 0.4 10*3/uL (ref 0.1–1.0)
Monocytes Relative: 7 %
Neutro Abs: 5.3 10*3/uL (ref 1.7–7.7)
Neutrophils Relative %: 83 %
Platelet Count: 218 10*3/uL (ref 150–400)
RBC: 3.14 MIL/uL — ABNORMAL LOW (ref 4.22–5.81)
RDW: 13.6 % (ref 11.5–15.5)
WBC Count: 6.4 10*3/uL (ref 4.0–10.5)
nRBC: 0 % (ref 0.0–0.2)

## 2021-05-18 LAB — LACTATE DEHYDROGENASE: LDH: 325 U/L — ABNORMAL HIGH (ref 98–192)

## 2021-05-18 MED ORDER — DARATUMUMAB-HYALURONIDASE-FIHJ 1800-30000 MG-UT/15ML ~~LOC~~ SOLN
1800.0000 mg | Freq: Once | SUBCUTANEOUS | Status: AC
  Administered 2021-05-18: 1800 mg via SUBCUTANEOUS
  Filled 2021-05-18: qty 15

## 2021-05-18 MED ORDER — ACETAMINOPHEN 325 MG PO TABS
650.0000 mg | ORAL_TABLET | Freq: Once | ORAL | Status: AC
  Administered 2021-05-18: 650 mg via ORAL

## 2021-05-18 MED ORDER — DIPHENHYDRAMINE HCL 25 MG PO CAPS
ORAL_CAPSULE | ORAL | Status: AC
  Filled 2021-05-18: qty 2

## 2021-05-18 MED ORDER — ACETAMINOPHEN 325 MG PO TABS
ORAL_TABLET | ORAL | Status: AC
  Filled 2021-05-18: qty 2

## 2021-05-18 MED ORDER — DIPHENHYDRAMINE HCL 25 MG PO CAPS
50.0000 mg | ORAL_CAPSULE | Freq: Once | ORAL | Status: AC
Start: 2021-05-18 — End: 2021-05-18
  Administered 2021-05-18: 50 mg via ORAL

## 2021-05-18 MED ORDER — DEXAMETHASONE 4 MG PO TABS
ORAL_TABLET | ORAL | Status: AC
  Filled 2021-05-18: qty 5

## 2021-05-18 MED ORDER — DEXAMETHASONE 4 MG PO TABS
20.0000 mg | ORAL_TABLET | Freq: Once | ORAL | Status: AC
  Administered 2021-05-18: 20 mg via ORAL

## 2021-05-18 NOTE — Progress Notes (Signed)
Per Dr. Demetria Pore, ok for treatment today with elevated blood pressure and no further medication needed for blood pressure. Pt. denies complaints of chest pain, dizziness, and no shortness of breath noted.

## 2021-05-18 NOTE — Progress Notes (Signed)
Samuel Butler Telephone:([phone]) [phone]   Fax:(336) [phone]  PROGRESS NOTE  Patient Care Team: Isaac Bliss, Rayford Halsted, MD as PCP - General (Internal Medicine) Lorretta Harp, MD as PCP - Cardiology (Cardiology)  Hematological/Oncological History # AL Amyloidosis of the Kidney 1) 08/03/2020: Free kappa 19.5, Lambda 205.7, Kappa/Lambda ratio: 0.09. M protein undetectable in serum.  2) 08/07/2020: kidney biopsy performed, results consistent with lambda light chain AL amyloidosis 3) 08/23/2020: establish care with Dr. Lorenso Courier  4) 09/01/2020: bone marrow biopsy shows plasma cell neoplasm with focal amyloid deposits 5) 09/29/2020: start of Cycle 1 Day 1 of Dara-CyBorD 6) 11/02/2020: Cycle 2 Day 1 of Dara-CyBorD 7) 12/01/2020: Cycle 3 Day 1 of Dara-CyBorD 8) 12/29/2020: Cycle 4 Day 1 of Dara-CyBorD 9) 01/05/2021: Holding Velcade due to diarrhea.  10) 01/19/2021: Adding back Velcade due to improvement of diarrhea with imodium 11) 01/26/2021: Cycle 5 Day 1 of Dara-CyBorD 12) 02/23/2021: Cycle 6 Day 1 of Dara-CyBorD 13) 03/23/2021: Cycle 7 Day 1 of Dara-CyBorD 14) 04/20/2021: Cycle 8 Day 1 of Dara-CyBorD 15) 05/18/2021: Cycle 9 Day 1 of Dara maintenance  Interval History:  Samuel Butler 60 y.o. male with medical history significant for AL amyloidosis of the kidney presents for a follow up visit. The patient's last visit was on 03/23/2021. In the interim since his last visit he has continued his treatment of Dara-CyBorD. Today, he is due for Cycle 9 Day 1.   On exam today [name] reports his diarrhea is markedly improved.  He reports that the Imodium therapy seems to be slowing it down.  Unfortunately he is having some marked swelling of his right hand.  He presented the emergency department with this yesterday and no clear abnormalities were found.  He reports that it is not tender only quite puffy.  He is currently being scheduled to see rheumatology to evaluate this further.  He has  not had swelling of any other areas no signs or symptoms concerning for allergic reaction.  He does have chronic ache in his joints but nothing new today.  He is otherwise willing and able to proceed with treatment today. He denies any fevers, chills, night sweats, worsening shortness of breath, chest pain or cough.  He has no other complaints.  A full 10 point ROS is listed below.  MEDICAL HISTORY:  Past Medical History:  Diagnosis Date   Cancer [facility])    DDD (degenerative disc disease), lumbar    per his report   Diabetes mellitus without complication ([place])    Gout 04/16/2013   Hyperlipidemia    Hypertension    IBD (inflammatory bowel disease) 04/05/2016   Pulmonary embolism ([place])    Tobacco use disorder 03/28/2015    SURGICAL HISTORY: Past Surgical History:  Procedure Laterality Date   ABDOMINAL AORTOGRAM W/LOWER EXTREMITY Right 06/26/2020   Procedure: ABDOMINAL AORTOGRAM W/LOWER EXTREMITY;  Surgeon: Lorretta Harp, MD;  Location: [place] [facility];  Service: Cardiovascular;  Laterality: Right;   COLONOSCOPY     PERIPHERAL VASCULAR INTERVENTION  06/26/2020   Procedure: PERIPHERAL VASCULAR INTERVENTION;  Surgeon: Lorretta Harp, MD;  Location: [place] [facility];  Service: Cardiovascular;;  Right SFA   TONSILLECTOMY      SOCIAL HISTORY: Social History   Socioeconomic History   Marital status: Married    Spouse name: Not on file   Number of children: Not on file   Years of education: Not on file   Highest education level: Not on file  Occupational History   Not on file  Tobacco Use   Smoking status: Former    Pack years: 0.00    Types: Cigarettes    Quit date: 10/23/2016    Years since quitting: 4.5   Smokeless tobacco: Never   Tobacco comments:    per patient 5 cigarettes a day   Vaping Use   Vaping Use: Never used  Substance and Sexual Activity   Alcohol use: Not Currently    Comment: occasional   Drug use: No   Sexual activity: Not on file  Other Topics  Concern   Not on file  Social History Narrative   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: living with wife      Spiritual Beliefs: Christian      Lifestyle: no regular exercise, diet described as fair            Social Determinants of Health   Financial Resource Strain: Medium Risk   Difficulty of Paying Living Expenses: Somewhat hard  Food Insecurity: No Food Insecurity   Worried About Charity fundraiser in the Last Year: Never true   Ran Out of Food in the Last Year: Never true  Transportation Needs: No Transportation Needs   Lack of Transportation (Medical): No   Lack of Transportation (Non-Medical): No  Physical Activity: Not on file  Stress: Not on file  Social Connections: Not on file  Intimate Partner Violence: Not on file    FAMILY HISTORY: Family History  Problem Relation Age of Onset   Hypertension Father    Diabetes Father    Stroke Father    Pancreatic cancer Father    Colon cancer Brother    Esophageal cancer Neg Hx    Stomach cancer Neg Hx     ALLERGIES:  is allergic to crestor [rosuvastatin calcium].  MEDICATIONS:  Current Outpatient Medications  Medication Sig Dispense Refill   acetaminophen (TYLENOL) 650 MG CR tablet Take 1,300 mg by mouth every 8 (eight) hours as needed for pain.      acyclovir (ZOVIRAX) 400 MG tablet Take 1 tablet by mouth twice daily 60 tablet 0   albuterol (VENTOLIN HFA) 108 (90 Base) MCG/ACT inhaler Inhale 2 puffs into the lungs every 6 (six) hours as needed for wheezing or shortness of breath. 8 g 0   atorvastatin (LIPITOR) 80 MG tablet Take 1 tablet by mouth once daily 90 tablet 0   cilostazol (PLETAL) 50 MG tablet Take 1 tablet (50 mg total) by mouth 2 (two) times daily. 60 tablet 3   clopidogrel (PLAVIX) 75 MG tablet Take 1 tablet (75 mg total) by mouth daily with breakfast. 90 tablet 3   dicyclomine (BENTYL) 20 MG tablet TAKE 1 TABLET BY MOUTH 4 TIMES DAILY 20- 30 MINUTES BEFORE MEAL(S) AND AT  BEDTIME 120 tablet 0   ELIQUIS 5 MG TABS tablet Take 1 tablet by mouth twice daily 180 tablet 0   Evolocumab (REPATHA SURECLICK) 476 MG/ML SOAJ Inject 140 mg into the skin every 14 (fourteen) days. 2 mL 12   ezetimibe (ZETIA) 10 MG tablet Take 1 tablet (10 mg total) by mouth daily. 90 tablet 3   HYDROcodone-acetaminophen (NORCO) 7.5-325 MG tablet Take 1 tablet by mouth every 6 (six) hours as needed for up to 5 days for moderate pain. 20 tablet 0   isosorbide mononitrate (IMDUR) 30 MG 24 hr tablet Take 1 tablet by mouth once daily 90 tablet 1   loperamide (IMODIUM)  1 MG/5ML solution Take 2 mg by mouth as needed for diarrhea or loose stools.     losartan (COZAAR) 50 MG tablet Take 1 tablet (50 mg total) by mouth daily. 90 tablet 3   metFORMIN (GLUCOPHAGE) 500 MG tablet Take 1 tablet by mouth once daily with breakfast 90 tablet 0   methylPREDNISolone (MEDROL DOSEPAK) 4 MG TBPK tablet Take as directed 1 each 0   ondansetron (ZOFRAN) 8 MG tablet TAKE 1 TABLET BY MOUTH EVERY 8 HOURS AS NEEDED FOR NAUSEA OR VOMITING 30 tablet 0   pantoprazole (PROTONIX) 40 MG tablet Take 1 tablet (40 mg total) by mouth daily. 90 tablet 1   potassium chloride SA (KLOR-CON) 20 MEQ tablet Take 2 tablets (40 mEq total) by mouth daily. 80MG x3 days then back to 40 mg/day 33 tablet 6   torsemide (DEMADEX) 20 MG tablet Take 1 tablet (20 mg total) by mouth 2 (two) times daily. 25m BID x 3 days then back to 276mBID 33 tablet 6   VEMLIDY 25 MG TABS Take 1 tablet (25 mg total) by mouth daily. 30 tablet 5   Vitamin D, Ergocalciferol, (DRISDOL) 1.25 MG (50000 UNIT) CAPS capsule Take 1 capsule by mouth once a week 12 capsule 0   Current Facility-Administered Medications  Medication Dose Route Frequency Provider Last Rate Last Admin   0.9 %  sodium chloride infusion  500 mL Intravenous Once StLadene ArtistMD        REVIEW OF SYSTEMS:   Constitutional: ( - ) fevers, ( - )  chills , ( - ) night sweats Eyes: ( - ) blurriness of  vision, ( - ) double vision, ( - ) watery eyes Ears, nose, mouth, throat, and face: ( - ) mucositis, ( - ) sore throat Respiratory: ( - ) cough, ( +) dyspnea, ( - ) wheezes Cardiovascular: ( - ) palpitation, ( - ) chest discomfort, ( +) lower extremity swelling Gastrointestinal:  ( - ) nausea, ( - ) heartburn, ( + ) change in bowel habits Skin: ( - ) abnormal skin rashes Lymphatics: ( - ) new lymphadenopathy, ( - ) easy bruising Neurological: (+) numbness, ( - ) tingling, ( - ) new weaknesses Behavioral/Psych: ( - ) mood change, ( - ) new changes  All other systems were reviewed with the patient and are negative.  PHYSICAL EXAMINATION: ECOG PERFORMANCE STATUS: 1 - Symptomatic but completely ambulatory  Vitals:   05/18/21 1004  BP: (!) 149/102  Pulse: 96  Resp: 18  Temp: 98.7 F (37.1 C)  SpO2: 100%   Filed Weights   05/18/21 1004  Weight: 197 lb 8 oz (89.6 kg)    GENERAL: well appearing middle aged AfSerbiamerican male alert, no distress and comfortable SKIN: skin color, texture, turgor are normal, no rashes or significant lesions EYES: conjunctiva are pink and non-injected, sclera clear LUNGS: clear to auscultation and percussion with normal breathing effort HEART: regular rate & rhythm and no murmurs and +2 lower extremity edema bilaterally  Musculoskeletal: no cyanosis of digits and no clubbing  PSYCH: alert & oriented x 3, fluent speech NEURO: no focal motor/sensory deficits  LABORATORY DATA:  I have reviewed the data as listed CBC Latest Ref Rng & Units 05/18/2021 05/17/2021 04/20/2021  WBC 4.0 - 10.5 K/uL 6.4 7.1 4.1  Hemoglobin 13.0 - 17.0 g/dL 10.5(L) 11.2(L) 11.0(L)  Hematocrit 39.0 - 52.0 % 30.6(L) 34.9(L) 32.4(L)  Platelets 150 - 400 K/uL 218 210 183  CMP Latest Ref Rng & Units 05/18/2021 05/17/2021 04/20/2021  Glucose 70 - 99 mg/dL 148(H) 145(H) 86  BUN 8 - 23 mg/dL _0 Creatinine 0.61 - 1.24 mg/dL 1.33(H) 1.36(H) 1.18  Sodium 135 - 145 mmol/L 144 143 144   Potassium 3.5 - 5.1 mmol/L 3.7 3.3(L) 3.4(L)  Chloride 98 - 111 mmol/L 104 102 105  CO2 22 - 32 mmol/L 28 32 30  Calcium 8.9 - 10.3 mg/dL 8.6(L) 8.1(L) 7.9(L)  Total Protein 6.5 - 8.1 g/dL 5.3(L) 5.0(L) 4.5(L)  Total Bilirubin 0.3 - 1.2 mg/dL 0.5 0.4 0.4  Alkaline Phos 38 - 126 U/L 82 77 83  AST 15 - 41 U/L _1 ALT 0 - 44 U/L _2 Lab Results  Component Value Date   MPROTEIN Not Observed 04/20/2021   MPROTEIN 0.1 (H) 03/16/2021   MPROTEIN 0.1 (H) 02/09/2021   Lab Results  Component Value Date   KPAFRELGTCHN 17.5 04/20/2021   KPAFRELGTCHN 10.7 03/16/2021   KPAFRELGTCHN 10.2 02/09/2021   LAMBDASER 20.0 04/20/2021   LAMBDASER 12.5 03/16/2021   LAMBDASER 13.7 02/09/2021   KAPLAMBRATIO 0.88 04/20/2021   KAPLAMBRATIO 0.86 03/16/2021   KAPLAMBRATIO 1.51 (L) 02/26/2021     RADIOGRAPHIC STUDIES: DG Hip Unilat W OR W/O Pelvis 2-3 Views Right  Result Date: 04/25/2021 CLINICAL DATA:  Progressive right hip pain for 1 year EXAM: DG HIP (WITH OR WITHOUT PELVIS) 2-3V RIGHT COMPARISON:  None. FINDINGS: Frontal view of the pelvis as well as frontal and frogleg lateral views of the right hip are obtained. No fracture, subluxation, or dislocation. There is severe bilateral hip osteoarthritis with joint space narrowing, eburnation, and marked marginal osteophyte formation. Sacroiliac joints are normal. Vascular stent right femoral region. IMPRESSION: 1. Severe bilateral symmetrical hip osteoarthritis. No acute fracture. Electronically Signed   By: Randa Ngo M.D.   On: 04/25/2021 09:40     ASSESSMENT & PLAN Ifeoluwa A Wurster 60 y.o. male with medical history significant for AL amyloidosis of the kidney presents for a follow up visit.  After review the labs, the records, discussion with the patient the findings are most consistent with AL amyloidosis affecting the kidney.  As such I recommended we start treatment with Dara-CyBorD.  After reviewing the biopsy results his findings  are most consistent with AL amyloidosis.  As such the treatment of choice would be to target his plasma cell population with a triplet or quadruplet therapy.  Therapy of choice in this case would consist of daratumumab, Velcade, cyclophosphamide, and dexamethasone.  Given the patient's good functional status we will start with full dose Dara-CyBorD.  I previously discussed the side effects of this chemotherapy with the patient including neuropathy, elevated blood pressure, drop in blood counts, hypersensitivity reaction, chest tightness, increased infection risk, and fatigue.  The patient and family voiced their understanding of these findings and are agreeable to moving forward with quadruple therapy with the goal of being a bridge to bone marrow transplant.    The regimen of choice is daratumumab, bortezomib, cyclophosphamide and dexamethasone per the ANDROMEDA Study ( Blood. 2020 Jul 2;136(1):71-80). Treatment consists of: Cyclophosphamide 300 mg/m2 intravenously and bortezomib 1.3 mg/m2 subcutaneously were given on days 1, 8, 15, and 22 of each 28 day cycle for up to 6 cycles. Dexamethasone 40 mg (starting dose) was given orally or intravenously weekly for each cycle for up to 6 cycles. DARA [place] was administered in a single, premixed vial and given by manual  Carefree injection over the course of 3 to 5 minutes weekly in cycles 1 to 2, every 2 weeks in cycles 3 to 6, and every 4 weeks thereafter as monotherapy for a maximum of 2 years. We would consider this continued dosing regimen.  On exam today,  Mr. Crescenzo had improved diarrhea with imodium therapy and is otherwise stable.  His weight has been stable. He underwent colonoscopy on 03/28/2021 which showed no gross abnormalities but Congo red show involvement of the arterioles.  Labs from today were reviewed without any intervention needed.  Patient will proceed with treatment as planned.  Patient will return to the clinic in 4 weeks as scheduled.  #AL Amyloidosis  Affecting the Kidney --bone marrow biopsy and kidney biopsy helped to confirm the diagnosis of AL amyloidosis. --currently being treated with  Dara-CyBorD (per the [facility] Trial), started Cycle 1 Day 1 on 09/29/2020.  --today is Cycle 9 Day 1 of treatment. Moving forward the patient will only require monthly injections.  --will monitoring monthly SPEP, UPEP, and SFLC while on treatment. --will have patient return for weekly treatment with q 4 week clinic visits  #Right Hand Swelling, new onset --seen in ED yesterday, etiology unclear --doses not appear to be an allergic reaction or issue with our treatment --encourage f/u with rheumatology as scheduled  #Diarrhea with abdominal cramping, improved --Improved with Imodium and Bentyl --GI EGD/Colonoscopy performed on 03/28/2021.  No gross abnormalities noted, however Congo red stain shows involvement of the arterioles but no the mucosal surface. The involvement of amyloid in causing his symptoms is uncertain.    #Weight Loss, stable --Diarrhea combined with diuresis is likely causing his weight loss --weight decreased to 194lbs, stable over last month --Under the care of dietician and drinking protein shakes --continue to monitor  #Injection Site Rash, improved --appears benign on exam today  --completed antibiotic therapy  #Supportive Care --chemotherapy education performed --zofran 68m q8H PRN and compazine 154mPO q6H for nausea --acyclovir 40059mO BID for VCZ prophylaxis -- EMLA cream for port not required, patient declines port at this time, but will reconsider if patient desires.  -- no pain medication required at this time.   No orders of the defined types were placed in this encounter.  All questions were answered. The patient knows to call the clinic with any problems, questions or concerns.  A total of more than 30 minutes were spent on this encounter and over half of that time was spent on counseling and coordination of  care as outlined above.   JohLedell PeoplesD Department of Hematology/Oncology [facility] LLCone: [phone]: [phone]: [email or web address]   05/18/2021 3:44 PM   Literature Support:   V, Wendie Chess, Weiss BM, VerRush Landmarkerlini G, Comenzo RL. Daratumumab plus CyBorD for patients with newly diagnosed AL amyloidosis: safety run-in results of ANDROMEDA. Blood. 2020 Jul 2;136(1):71-80.   --Daratumumab-CyBorD was well tolerated, with no new safety concerns versus the intravenous formulation, and demonstrated robust hematologic and organ responses.

## 2021-05-21 ENCOUNTER — Telehealth: Payer: Self-pay | Admitting: Hematology and Oncology

## 2021-05-21 ENCOUNTER — Ambulatory Visit (INDEPENDENT_AMBULATORY_CARE_PROVIDER_SITE_OTHER): Payer: BC Managed Care – PPO | Admitting: Physician Assistant

## 2021-05-21 ENCOUNTER — Encounter: Payer: Self-pay | Admitting: Physician Assistant

## 2021-05-21 ENCOUNTER — Other Ambulatory Visit: Payer: Self-pay

## 2021-05-21 VITALS — BP 136/98 | HR 104 | Ht 71.0 in | Wt 197.8 lb

## 2021-05-21 DIAGNOSIS — E8581 Light chain (AL) amyloidosis: Secondary | ICD-10-CM

## 2021-05-21 DIAGNOSIS — Z5112 Encounter for antineoplastic immunotherapy: Secondary | ICD-10-CM | POA: Diagnosis not present

## 2021-05-21 DIAGNOSIS — R197 Diarrhea, unspecified: Secondary | ICD-10-CM | POA: Diagnosis not present

## 2021-05-21 LAB — KAPPA/LAMBDA LIGHT CHAINS
Kappa free light chain: 19.2 mg/L (ref 3.3–19.4)
Kappa, lambda light chain ratio: 0.96 (ref 0.26–1.65)
Lambda free light chains: 20.1 mg/L (ref 5.7–26.3)

## 2021-05-21 NOTE — Progress Notes (Signed)
Chief Complaint: Follow-up diarrhea  HPI:    Samuel Butler is a 60 year old African-American male with a past medical history as listed below including diabetes, renal amyloidosis currently receiving chemotherapy, chronic hepatitis B and a PE in early December 21 on Eliquis.  And others, who presents clinic today for follow-up of diarrhea.    01/18/2021 patient seen by Dr. Fuller Plan with lower abdominal pain diarrhea, weight loss and dysphagia.  Described that stool studies in December 21 had been positive for enteropathogenic E. coli which was treated with a 3-day course of Cipro.    01/22/2021 GI profile was negative.    03/28/2021 colonoscopy for diarrhea with congested mucosa and patchy erythema in the rectum and the sigmoid colon as well as internal hemorrhoids.  Pathology showed benign colonic mucosa.    03/28/2021 EGD with erythematous mucosa in the antrum.  Biopsies showed mild chronic gastritis with intestinal metaplasia.    04/19/2021 patient sent a message describing that he was having diarrhea every day no matter what he ate or drink and was refusing to to continue Imodium because it constipated him.  At that time reported watery diarrhea for 6 months and then losing weight about 7 to 10 pounds over the past month.  Urgency with a bowel movement 15 to 30 minutes after eating anything.  Takes Imodium as needed because it causes constipation.  He has been taking dicyclomine 2 capsules in the morning and evening.  At that time told to take 1 capsule before meals and at bedtime.    Today, the patient presents to clinic and tells me that GI wise he seems to be doing better.  He is using only 1 Imodium pill a week and otherwise using his Dicyclomine 20 mg 4 times daily, sometimes he forgets his fourth dose and it is just 3 times a day but tells me his stools are back to mostly normal and are soft/formed and not urgent.  Does describe a lot of excess noise in his gut especially when he lays down to sleep but this  is not painful at all.  Some better if he lays on his right side at night.    Denies fever, chills, blood in his stool, nausea, vomiting, heartburn, reflux or dysphagia  Past Medical History:  Diagnosis Date   Cancer ([place])    DDD (degenerative disc disease), lumbar    per his report   Diabetes mellitus without complication ([place])    Gout 04/16/2013   Hyperlipidemia    Hypertension    IBD (inflammatory bowel disease) 04/05/2016   Pulmonary embolism ([place])    Tobacco use disorder 03/28/2015    Past Surgical History:  Procedure Laterality Date   ABDOMINAL AORTOGRAM W/LOWER EXTREMITY Right 06/26/2020   Procedure: ABDOMINAL AORTOGRAM W/LOWER EXTREMITY;  Surgeon: Lorretta Harp, MD;  Location: [facility];  Service: Cardiovascular;  Laterality: Right;   COLONOSCOPY     PERIPHERAL VASCULAR INTERVENTION  06/26/2020   Procedure: PERIPHERAL VASCULAR INTERVENTION;  Surgeon: Lorretta Harp, MD;  Location: [facility];  Service: Cardiovascular;;  Right SFA   TONSILLECTOMY      Current Outpatient Medications  Medication Sig Dispense Refill   acetaminophen (TYLENOL) 650 MG CR tablet Take 1,300 mg by mouth every 8 (eight) hours as needed for pain.      acyclovir (ZOVIRAX) 400 MG tablet Take 1 tablet by mouth twice daily 60 tablet 0   albuterol (VENTOLIN HFA) 108 (90 Base) MCG/ACT inhaler Inhale 2 puffs  into the lungs every 6 (six) hours as needed for wheezing or shortness of breath. 8 g 0   atorvastatin (LIPITOR) 80 MG tablet Take 1 tablet by mouth once daily 90 tablet 0   cilostazol (PLETAL) 50 MG tablet Take 1 tablet (50 mg total) by mouth 2 (two) times daily. 60 tablet 3   clopidogrel (PLAVIX) 75 MG tablet Take 1 tablet (75 mg total) by mouth daily with breakfast. 90 tablet 3   dicyclomine (BENTYL) 20 MG tablet TAKE 1 TABLET BY MOUTH 4 TIMES DAILY 20- 30 MINUTES BEFORE MEAL(S) AND AT BEDTIME 120 tablet 0   ELIQUIS 5 MG TABS tablet Take 1 tablet by mouth twice daily 180 tablet 0    Evolocumab (REPATHA SURECLICK) 169 MG/ML SOAJ Inject 140 mg into the skin every 14 (fourteen) days. 2 mL 12   ezetimibe (ZETIA) 10 MG tablet Take 1 tablet (10 mg total) by mouth daily. 90 tablet 3   HYDROcodone-acetaminophen (NORCO) 7.5-325 MG tablet Take 1 tablet by mouth every 6 (six) hours as needed for up to 5 days for moderate pain. 20 tablet 0   isosorbide mononitrate (IMDUR) 30 MG 24 hr tablet Take 1 tablet by mouth once daily 90 tablet 1   loperamide (IMODIUM) 1 MG/5ML solution Take 2 mg by mouth as needed for diarrhea or loose stools.     losartan (COZAAR) 50 MG tablet Take 1 tablet (50 mg total) by mouth daily. 90 tablet 3   metFORMIN (GLUCOPHAGE) 500 MG tablet Take 1 tablet by mouth once daily with breakfast 90 tablet 0   methylPREDNISolone (MEDROL DOSEPAK) 4 MG TBPK tablet Take as directed 1 each 0   ondansetron (ZOFRAN) 8 MG tablet TAKE 1 TABLET BY MOUTH EVERY 8 HOURS AS NEEDED FOR NAUSEA OR VOMITING 30 tablet 0   pantoprazole (PROTONIX) 40 MG tablet Take 1 tablet (40 mg total) by mouth daily. 90 tablet 1   potassium chloride SA (KLOR-CON) 20 MEQ tablet Take 2 tablets (40 mEq total) by mouth daily. 80MG  x3 days then back to 40 mg/day 33 tablet 6   torsemide (DEMADEX) 20 MG tablet Take 1 tablet (20 mg total) by mouth 2 (two) times daily. 40mg  BID x 3 days then back to 20mg  BID 33 tablet 6   VEMLIDY 25 MG TABS Take 1 tablet (25 mg total) by mouth daily. 30 tablet 5   Vitamin D, Ergocalciferol, (DRISDOL) 1.25 MG (50000 UNIT) CAPS capsule Take 1 capsule by mouth once a week 12 capsule 0   Current Facility-Administered Medications  Medication Dose Route Frequency Provider Last Rate Last Admin   0.9 %  sodium chloride infusion  500 mL Intravenous Once Ladene Artist, MD        Allergies as of 05/21/2021 - Review Complete 05/21/2021  Allergen Reaction Noted   Crestor [rosuvastatin calcium] Other (See Comments) 02/16/2018    Family History  Problem Relation Age of Onset    Hypertension Father    Diabetes Father    Stroke Father    Pancreatic cancer Father    Colon cancer Brother    Esophageal cancer Neg Hx    Stomach cancer Neg Hx     Social History   Socioeconomic History   Marital status: Married    Spouse name: Not on file   Number of children: Not on file   Years of education: Not on file   Highest education level: Not on file  Occupational History   Not on file  Tobacco Use  Smoking status: Former    Pack years: 0.00    Types: Cigarettes    Quit date: 10/23/2016    Years since quitting: 4.5   Smokeless tobacco: Never   Tobacco comments:    per patient 5 cigarettes a day   Vaping Use   Vaping Use: Never used  Substance and Sexual Activity   Alcohol use: Not Currently    Comment: occasional   Drug use: No   Sexual activity: Not Currently  Other Topics Concern   Not on file  Social History Narrative   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: living with wife      Spiritual Beliefs: Christian      Lifestyle: no regular exercise, diet described as fair            Social Determinants of Health   Financial Resource Strain: Medium Risk   Difficulty of Paying Living Expenses: Somewhat hard  Food Insecurity: No Food Insecurity   Worried About Charity fundraiser in the Last Year: Never true   Ran Out of Food in the Last Year: Never true  Transportation Needs: No Transportation Needs   Lack of Transportation (Medical): No   Lack of Transportation (Non-Medical): No  Physical Activity: Not on file  Stress: Not on file  Social Connections: Not on file  Intimate Partner Violence: Not on file    Review of Systems:    Constitutional: No fever or chills Cardiovascular: No chest pain Respiratory: No SOB  Gastrointestinal: See HPI and otherwise negative   Physical Exam:  Vital signs: Pulse (!) 104   Ht 5\' 11"  (1.803 m)   Wt 197 lb 12.8 oz (89.7 kg)   SpO2 97%   BMI 27.59 kg/m   Constitutional:    Pleasant AA male appears to be in NAD, Well developed, Well nourished, alert and cooperative Respiratory: Respirations even and unlabored. Lungs clear to auscultation bilaterally.   No wheezes, crackles, or rhonchi.  Cardiovascular: Normal S1, S2. No MRG. Regular rate and rhythm. No peripheral edema, cyanosis or pallor.  Gastrointestinal:  Soft, nondistended, nontender. No rebound or guarding. Normal bowel sounds. No appreciable masses or hepatomegaly. Rectal:  Not performed.  Psychiatric: Demonstrates good judgement and reason without abnormal affect or behaviors.  RELEVANT LABS AND IMAGING: CBC    Component Value Date/Time   WBC 6.4 05/18/2021 0952   WBC 7.1 05/17/2021 1919   RBC 3.14 (L) 05/18/2021 0952   HGB 10.5 (L) 05/18/2021 0952   HGB 14.2 06/23/2020 1034   HCT 30.6 (L) 05/18/2021 0952   HCT 43.7 06/23/2020 1034   PLT 218 05/18/2021 0952   PLT 351 06/23/2020 1034   MCV 97.5 05/18/2021 0952   MCV 90 06/23/2020 1034   MCH 33.4 05/18/2021 0952   MCHC 34.3 05/18/2021 0952   RDW 13.6 05/18/2021 0952   RDW 13.3 06/23/2020 1034   LYMPHSABS 0.6 (L) 05/18/2021 0952   MONOABS 0.4 05/18/2021 0952   EOSABS 0.0 05/18/2021 0952   BASOSABS 0.0 05/18/2021 0952    CMP     Component Value Date/Time   NA 144 05/18/2021 0952   NA 145 (H) 09/26/2020 1045   K 3.7 05/18/2021 0952   CL 104 05/18/2021 0952   CO2 28 05/18/2021 0952   GLUCOSE 148 (H) 05/18/2021 0952   BUN 19 05/18/2021 0952   BUN 15 09/26/2020 1045   CREATININE 1.33 (H) 05/18/2021 0952   CREATININE 1.03 08/22/2020 0955   CALCIUM  8.6 (L) 05/18/2021 0952   CALCIUM 7.6 (L) 08/04/2020 0859   PROT 5.3 (L) 05/18/2021 0952   ALBUMIN 1.8 (L) 05/18/2021 0952   AST 16 05/18/2021 0952   ALT 12 05/18/2021 0952   ALT 30 08/22/2020 0955   ALKPHOS 82 05/18/2021 0952   BILITOT 0.5 05/18/2021 0952   GFRNONAA >60 05/18/2021 0952   GFRNONAA 79 08/22/2020 0955   GFRAA 82 09/26/2020 1045   GFRAA 91 08/22/2020 0955     Assessment: 1.  Diarrhea: Better with Imodium and Dicyclomine, recent EGD and colonoscopy as in HPI  Plan: 1.  Continue Imodium 1 tab per week or increase as needed. 2.  Continue Dicyclomine 20 mg 4 times daily, 20 to 30 minutes for meals and at bedtime.  Patient tells me he does not need a refill this medication right now. 3.  Patient to follow in clinic as needed with [place] in the near future.  Ellouise Newer, PA-C [facility] 05/21/2021, 10:57 AM  Cc: Isaac Bliss, [place]*

## 2021-05-21 NOTE — Patient Instructions (Signed)
Follow up as needed.  If you are age 61 or older, your body mass index should be between 23-30. Your Body mass index is 27.59 kg/m. If this is out of the aforementioned range listed, please consider follow up with your Primary Care Provider.  If you are age 87 or younger, your body mass index should be between 19-25. Your Body mass index is 27.59 kg/m. If this is out of the aformentioned range listed, please consider follow up with your Primary Care Provider.   __________________________________________________________  The [facility] providers would like to encourage you to use [facility] to communicate with providers for non-urgent requests or questions.  Due to long hold times on the telephone, sending your provider a message by [facility] may be a faster and more efficient way to get a response.  Please allow 48 business hours for a response.  Please remember that this is for non-urgent requests.

## 2021-05-21 NOTE — Progress Notes (Signed)
Reviewed and agree with management plan.  Demondre Aguas T. Abbey Veith, MD FACG 

## 2021-05-21 NOTE — Telephone Encounter (Signed)
Scheduled per los. Called and left msg. Mailed printout  °

## 2021-05-22 ENCOUNTER — Telehealth: Payer: Self-pay | Admitting: Cardiovascular Disease

## 2021-05-22 LAB — UPEP/UIFE/LIGHT CHAINS/TP, 24-HR UR
% BETA, Urine: 8.4 %
ALPHA 1 URINE: 3.4 %
Albumin, U: 83.2 %
Alpha 2, Urine: 4.3 %
Free Kappa Lt Chains,Ur: 17.24 mg/L (ref 1.17–86.46)
Free Kappa/Lambda Ratio: 1.57 — ABNORMAL LOW (ref 1.83–14.26)
Free Lambda Lt Chains,Ur: 11 mg/L (ref 0.27–15.21)
GAMMA GLOBULIN URINE: 0.7 %
Total Protein, Urine-Ur/day: 3553 mg/24 hr — ABNORMAL HIGH (ref 30–150)
Total Protein, Urine: 157.9 mg/dL
Total Volume: 2250

## 2021-05-22 LAB — MULTIPLE MYELOMA PANEL, SERUM
Albumin SerPl Elph-Mcnc: 2.1 g/dL — ABNORMAL LOW (ref 2.9–4.4)
Albumin/Glob SerPl: 0.9 (ref 0.7–1.7)
Alpha 1: 0.3 g/dL (ref 0.0–0.4)
Alpha2 Glob SerPl Elph-Mcnc: 1.3 g/dL — ABNORMAL HIGH (ref 0.4–1.0)
B-Globulin SerPl Elph-Mcnc: 0.8 g/dL (ref 0.7–1.3)
Gamma Glob SerPl Elph-Mcnc: 0.2 g/dL — ABNORMAL LOW (ref 0.4–1.8)
Globulin, Total: 2.6 g/dL (ref 2.2–3.9)
IgA: 60 mg/dL — ABNORMAL LOW (ref 61–437)
IgG (Immunoglobin G), Serum: 252 mg/dL — ABNORMAL LOW (ref 603–1613)
IgM (Immunoglobulin M), Srm: 21 mg/dL (ref 20–172)
Total Protein ELP: 4.7 g/dL — ABNORMAL LOW (ref 6.0–8.5)

## 2021-05-22 NOTE — Telephone Encounter (Signed)
New message:     Lelon Frohlich from [place] [facility] [place] needing notes sent over. Dr. Gwenlyn Found sent over a referral. Please fax to [phone]

## 2021-05-22 NOTE — Telephone Encounter (Signed)
Original fax sent on 05/16/21 - will resend with MD note from this day

## 2021-05-29 ENCOUNTER — Ambulatory Visit (INDEPENDENT_AMBULATORY_CARE_PROVIDER_SITE_OTHER): Payer: BC Managed Care – PPO | Admitting: Cardiovascular Disease

## 2021-05-29 ENCOUNTER — Encounter: Payer: Self-pay | Admitting: Internal Medicine

## 2021-05-29 ENCOUNTER — Ambulatory Visit (INDEPENDENT_AMBULATORY_CARE_PROVIDER_SITE_OTHER): Payer: BC Managed Care – PPO | Admitting: Internal Medicine

## 2021-05-29 ENCOUNTER — Encounter: Payer: Self-pay | Admitting: Cardiovascular Disease

## 2021-05-29 ENCOUNTER — Other Ambulatory Visit: Payer: Self-pay

## 2021-05-29 VITALS — BP 129/90 | HR 111 | Temp 98.4°F | Wt 181.6 lb

## 2021-05-29 VITALS — BP 140/92 | HR 76 | Ht 70.0 in | Wt 181.0 lb

## 2021-05-29 DIAGNOSIS — I739 Peripheral vascular disease, unspecified: Secondary | ICD-10-CM | POA: Diagnosis not present

## 2021-05-29 DIAGNOSIS — B181 Chronic viral hepatitis B without delta-agent: Secondary | ICD-10-CM

## 2021-05-29 DIAGNOSIS — E8581 Light chain (AL) amyloidosis: Secondary | ICD-10-CM | POA: Diagnosis not present

## 2021-05-29 MED ORDER — VEMLIDY 25 MG PO TABS: 25.0000 mg | ORAL_TABLET | Freq: Every day | ORAL | 5 refills | Status: AC

## 2021-05-29 NOTE — Patient Instructions (Signed)
Medication Instructions:  The current medical regimen is effective;  continue present plan and medications.  *If you need a refill on your cardiac medications before your next appointment, please call your pharmacy*   Testing/Procedures: Your physician has requested that you have a lower extremity arterial duplex. This test is an ultrasound of the arteries in the legs. It looks at arterial blood flow in the legs. Allow one hour for Lower Arterial scans. There are no restrictions or special instructions    Follow-Up: At [facility], you and your health needs are our priority.  As part of our continuing mission to provide you with exceptional heart care, we have created designated Provider Care Teams.  These Care Teams include your primary Cardiologist (physician) and Advanced Practice Providers (APPs -  Physician Assistants and Nurse Practitioners) who all work together to provide you with the care you need, when you need it.  We recommend signing up for the patient portal called "MyChart".  Sign up information is provided on this After Visit Summary.  MyChart is used to connect with patients for Virtual Visits (Telemedicine).  Patients are able to view lab/test results, encounter notes, upcoming appointments, etc.  Non-urgent messages can be sent to your provider as well.   To learn more about what you can do with MyChart, go to [email or web address].    Your next appointment:   12 month(s)  The format for your next appointment:   In Person  Provider:   Quay Burow, MD

## 2021-05-29 NOTE — Assessment & Plan Note (Addendum)
Patient continues on TAF 25mg  daily for treatment of chronic HBV and will continue this.  I sent refills today.  His LFTs earlier this month were normal and he appears to be tolerating this medication so far.  We will repeat his DNA PCR today.  RTC 3 months at which point we will plan to repeat his elastography ultrasound and FibroSure.  We will plan for repeat hepatitis serologies at that time as well.

## 2021-05-29 NOTE — Patient Instructions (Signed)
Thank you for coming to see me today. It was a pleasure seeing you.  To Do: I refilled your Vemlidy.  Please continue taking daily Labs today and I will let you know about the results See me in 3 months.   If you have any questions or concerns, please do not hesitate to call the office at [phone].  Take Care,   Jule Ser, DO

## 2021-05-29 NOTE — Assessment & Plan Note (Signed)
Samuel Butler returns today for follow-up of PAD.  He was initially sent to me by Dr. Debara Pickett for claudication.  I performed angiography on him 06/26/2020 revealing high-grade lesions in his proximal and mid right SFA as well as calcified subtotally occluded right popliteal artery and tibioperoneal trunk.  He had high-grade distal left SFA and popliteal artery stenosis as well.  Because of a narrow bifurcation I performed an antegrade stick and stented both his proximal and mid right SFA resulting in marked improvement in his Doppler studies.  His claudication resolved as well.  When I saw him last he was continuing to complain of some claudication type symptoms which markedly improved as well after the addition of Pletal.  Recent Doppler studies performed 04/02/2021 revealed a right ABI of 0.87 and a left of 0.77 with a widely patent right SFA stents.

## 2021-05-29 NOTE — Progress Notes (Signed)
[facility]  CHIEF COMPLAINT:    Follow up for hepatitis B  SUBJECTIVE:    Samuel Butler is a 60 y.o. male with PMHx as below who presents to the clinic for follow-up for hepatitis B.   Samuel Butler is currently on Vemlidy 25 mg daily.  I last saw him 3 months ago on April 12.  In that interim, he has been continuing to follow with oncology, cardiology, and gastroenterology.  He has also had an emergency department visit for right hand pain earlier this month.  His cardiologist has referred him to rheumatology to further evaluate his polyarthritis.  His most recent CMP is from 05/18/2021 which showed that his AST and ALT are both normal.  His most recent hepatitis B DNA PCR around that time was 4550 copies compared to 122,000 copies prior to starting [facility].  He underwent right upper quadrant ultrasound in April as well that showed fatty liver with cirrhotic appearance.  This was consistent with prior imaging.  He underwent ultrasound with elastography in October 2021 with a median K PA of 3.3 which has a high probability of being normal.  He is here with his wife today and reports continuing to tolerate Vemlidy well.  He has some abdominal discomfort that is stable.  No n/v.  No jaundice.   Please see A&P for the details of today's visit and status of the patient's medical problems.   Patient's Medications  New Prescriptions   No medications on file  Previous Medications   ACETAMINOPHEN (TYLENOL) 650 MG CR TABLET    Take 1,300 mg by mouth every 8 (eight) hours as needed for pain.    ACYCLOVIR (ZOVIRAX) 400 MG TABLET    Take 1 tablet by mouth twice daily   ALBUTEROL (VENTOLIN HFA) 108 (90 BASE) MCG/ACT INHALER    Inhale 2 puffs into the lungs every 6 (six) hours as needed for wheezing or shortness of breath.   ATORVASTATIN (LIPITOR) 80 MG TABLET    Take 1 tablet by mouth once daily   CILOSTAZOL (PLETAL) 50 MG TABLET    Take 1 tablet (50 mg total) by mouth  2 (two) times daily.   CLOPIDOGREL (PLAVIX) 75 MG TABLET    Take 1 tablet (75 mg total) by mouth daily with breakfast.   DICYCLOMINE (BENTYL) 20 MG TABLET    TAKE 1 TABLET BY MOUTH 4 TIMES DAILY 20- 30 MINUTES BEFORE MEAL(S) AND AT BEDTIME   ELIQUIS 5 MG TABS TABLET    Take 1 tablet by mouth twice daily   EVOLOCUMAB (REPATHA SURECLICK) 742 MG/ML SOAJ    Inject 140 mg into the skin every 14 (fourteen) days.   EZETIMIBE (ZETIA) 10 MG TABLET    Take 1 tablet (10 mg total) by mouth daily.   ISOSORBIDE MONONITRATE (IMDUR) 30 MG 24 HR TABLET    Take 1 tablet by mouth once daily   LOPERAMIDE (IMODIUM) 1 MG/5ML SOLUTION    Take 2 mg by mouth as needed for diarrhea or loose stools.   LOSARTAN (COZAAR) 50 MG TABLET    Take 1 tablet (50 mg total) by mouth daily.   METFORMIN (GLUCOPHAGE) 500 MG TABLET    Take 1 tablet by mouth once daily with breakfast   METHYLPREDNISOLONE (MEDROL DOSEPAK) 4 MG TBPK TABLET    Take as directed   ONDANSETRON (ZOFRAN) 8 MG TABLET    TAKE 1 TABLET BY MOUTH EVERY 8 HOURS AS NEEDED  FOR NAUSEA OR VOMITING   PANTOPRAZOLE (PROTONIX) 40 MG TABLET    Take 1 tablet (40 mg total) by mouth daily.   POTASSIUM CHLORIDE SA (KLOR-CON) 20 MEQ TABLET    Take 2 tablets (40 mEq total) by mouth daily. 80MG  x3 days then back to 40 mg/day   TORSEMIDE (DEMADEX) 20 MG TABLET    Take 1 tablet (20 mg total) by mouth 2 (two) times daily. 40mg  BID x 3 days then back to 20mg  BID   VITAMIN D, ERGOCALCIFEROL, (DRISDOL) 1.25 MG (50000 UNIT) CAPS CAPSULE    Take 1 capsule by mouth once a week  Modified Medications   Modified Medication Previous Medication   VEMLIDY 25 MG TABS VEMLIDY 25 MG TABS      Take 1 tablet (25 mg total) by mouth daily.    Take 1 tablet (25 mg total) by mouth daily.  Discontinued Medications   No medications on file      Past Medical History:  Diagnosis Date   Cancer ([place])    DDD (degenerative disc disease), lumbar    per his report   Diabetes mellitus without complication ([place])     Gout 04/16/2013   Hyperlipidemia    Hypertension    IBD (inflammatory bowel disease) 04/05/2016   Pulmonary embolism ([facility])    Tobacco use disorder 03/28/2015    Social History   Tobacco Use   Smoking status: Former    Types: Cigarettes    Quit date: 10/23/2016    Years since quitting: 4.6   Smokeless tobacco: Never   Tobacco comments:    per patient 5 cigarettes a day   Vaping Use   Vaping Use: Never used  Substance Use Topics   Alcohol use: Not Currently    Comment: occasional   Drug use: No    Family History  Problem Relation Age of Onset   Hypertension Father    Diabetes Father    Stroke Father    Pancreatic cancer Father    Colon cancer Brother    Esophageal cancer Neg Hx    Stomach cancer Neg Hx     Allergies  Allergen Reactions   Crestor [Rosuvastatin Calcium] Other (See Comments)    Joint aches    Review of Systems  Constitutional:  Negative for chills and fever.  Gastrointestinal:  Positive for abdominal pain. Negative for nausea and vomiting.  Skin:        Negative jaundice.    OBJECTIVE:    Vitals:   05/29/21 1339 05/29/21 1340  BP:  129/90  Pulse:  (!) 111  Temp: 98.4 F (36.9 C) 98.4 F (36.9 C)  TempSrc: Oral Oral  Weight: 181 lb 9.6 oz (82.4 kg) 181 lb 9.6 oz (82.4 kg)   Body mass index is 26.06 kg/m.  Physical Exam Constitutional:      General: He is not in acute distress.    Appearance: Normal appearance.  HENT:     Head: Normocephalic and atraumatic.  Pulmonary:     Effort: Pulmonary effort is normal. No respiratory distress.  Neurological:     General: No focal deficit present.     Mental Status: He is alert and oriented to person, place, and time.  Psychiatric:        Mood and Affect: Mood normal.        Behavior: Behavior normal.     Labs and Microbiology: CBC Latest Ref Rng & Units 05/18/2021 05/17/2021 04/20/2021  WBC 4.0 - 10.5 K/uL 6.4 7.1 4.1  Hemoglobin 13.0 - 17.0 g/dL 10.5(L) 11.2(L) 11.0(L)  Hematocrit  39.0 - 52.0 % 30.6(L) 34.9(L) 32.4(L)  Platelets 150 - 400 K/uL 218 210 183   CMP Latest Ref Rng & Units 05/18/2021 05/17/2021 04/20/2021  Glucose 70 - 99 mg/dL 148(H) 145(H) 86  BUN 8 - 23 mg/dL 19 19 14   Creatinine 0.61 - 1.24 mg/dL 1.33(H) 1.36(H) 1.18  Sodium 135 - 145 mmol/L 144 143 144  Potassium 3.5 - 5.1 mmol/L 3.7 3.3(L) 3.4(L)  Chloride 98 - 111 mmol/L 104 102 105  CO2 22 - 32 mmol/L 28 32 30  Calcium 8.9 - 10.3 mg/dL 8.6(L) 8.1(L) 7.9(L)  Total Protein 6.5 - 8.1 g/dL 5.3(L) 5.0(L) 4.5(L)  Total Bilirubin 0.3 - 1.2 mg/dL 0.5 0.4 0.4  Alkaline Phos 38 - 126 U/L 82 77 83  AST 15 - 41 U/L 16 16 19   ALT 0 - 44 U/L 12 11 14         ASSESSMENT & PLAN:    Chronic viral hepatitis B without delta-agent ([facility]) Patient continues on TAF 25mg  daily for treatment of chronic HBV and will continue this.  I sent refills today.  His LFTs earlier this month were normal and he appears to be tolerating this medication so far.  We will repeat his DNA PCR today.  RTC 3 months at which point we will plan to repeat his elastography ultrasound and FibroSure.  We will plan for repeat hepatitis serologies at that time as well.  Amyloidosis ([place]) Continues to follow with Dr. Lorenso Courier.   Orders Placed This Encounter  Procedures   Hepatitis B DNA, ultraquantitative, PCR           Raynelle High[facility] 05/29/2021, 2:02 PM

## 2021-05-29 NOTE — Progress Notes (Signed)
05/29/2021 Karla A Wiegel   1960/11/22  [id number]  Primary Physician Isaac Bliss, Rayford Halsted, MD Primary Cardiologist: Lorretta Harp MD [name], [place]  HPI:  Samuel Butler is a 60 y.o.   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  He was referred by Dr. Debara Pickett for peripheral vascular valuation because of lifestyle limiting claudication.  I last saw him in the office 03/13/2021. History factors include just continued tobacco use for years ago when he was smoking 2 packs a week for the last 40 years, treated hypertension and hyperlipidemia.  Is not diabetic.  His mother did die of a myocardial infarction in her 18s.  He was referred to Dr. Debara Pickett because of peripheral edema and hyper lipidemia.  A work-up is in progress.  He was prescribed a statin which she could not tolerate.  He has had lifestyle limiting claudication for last 6 months.  Dopplers performed 02/28/2020 revealed a right ABI of 0.76 and a left of 0.87 with a high-frequency signal in his mid right SFA and left tibial vessel disease.   I performed peripheral angiography on him 06/26/2020 revealing high-grade proximal and mid right SFA stenosis as well as bilateral popliteal stenoses and one-vessel runoff.  I ultimately performed antegrade stick on the right because of narrow iliac bifurcation and stented his proximal and mid right SFA.  His follow-up Dopplers improved.  His claudication did as well however 3 months ago I began him on Pletal which resulted in significant additional improvement.     Current Meds  Medication Sig   acetaminophen (TYLENOL) 650 MG CR tablet Take 1,300 mg by mouth every 8 (eight) hours as needed for pain.    acyclovir (ZOVIRAX) 400 MG tablet Take 1 tablet by mouth twice daily   albuterol (VENTOLIN HFA) 108 (90 Base) MCG/ACT inhaler Inhale 2 puffs into the lungs every 6 (six) hours as needed for wheezing or shortness of  breath.   atorvastatin (LIPITOR) 80 MG tablet Take 1 tablet by mouth once daily   cilostazol (PLETAL) 50 MG tablet Take 1 tablet (50 mg total) by mouth 2 (two) times daily.   clopidogrel (PLAVIX) 75 MG tablet Take 1 tablet (75 mg total) by mouth daily with breakfast.   dicyclomine (BENTYL) 20 MG tablet TAKE 1 TABLET BY MOUTH 4 TIMES DAILY 20- 30 MINUTES BEFORE MEAL(S) AND AT BEDTIME   ELIQUIS 5 MG TABS tablet Take 1 tablet by mouth twice daily   Evolocumab (REPATHA SURECLICK) 716 MG/ML SOAJ Inject 140 mg into the skin every 14 (fourteen) days.   ezetimibe (ZETIA) 10 MG tablet Take 1 tablet (10 mg total) by mouth daily.   isosorbide mononitrate (IMDUR) 30 MG 24 hr tablet Take 1 tablet by mouth once daily   loperamide (IMODIUM) 1 MG/5ML solution Take 2 mg by mouth as needed for diarrhea or loose stools.   losartan (COZAAR) 50 MG tablet Take 1 tablet (50 mg total) by mouth daily.   metFORMIN (GLUCOPHAGE) 500 MG tablet Take 1 tablet by mouth once daily with breakfast   methylPREDNISolone (MEDROL DOSEPAK) 4 MG TBPK tablet Take as directed   ondansetron (ZOFRAN) 8 MG tablet TAKE 1 TABLET BY MOUTH EVERY 8 HOURS AS NEEDED FOR NAUSEA OR VOMITING   pantoprazole (PROTONIX) 40 MG tablet Take 1 tablet (40 mg total) by mouth daily.   potassium chloride SA (KLOR-CON) 20 MEQ tablet Take 2 tablets (40 mEq total)  by mouth daily. 80MG  x3 days then back to 40 mg/day   torsemide (DEMADEX) 20 MG tablet Take 1 tablet (20 mg total) by mouth 2 (two) times daily. 40mg  BID x 3 days then back to 20mg  BID   VEMLIDY 25 MG TABS Take 1 tablet (25 mg total) by mouth daily.   Vitamin D, Ergocalciferol, (DRISDOL) 1.25 MG (50000 UNIT) CAPS capsule Take 1 capsule by mouth once a week   Current Facility-Administered Medications for the 05/29/21 encounter (Office Visit) with Lorretta Harp, MD  Medication   0.9 %  sodium chloride infusion     Allergies  Allergen Reactions   Crestor [Rosuvastatin Calcium] Other (See Comments)     Joint aches    Social History   Socioeconomic History   Marital status: Married    Spouse name: Not on file   Number of children: Not on file   Years of education: Not on file   Highest education level: Not on file  Occupational History   Not on file  Tobacco Use   Smoking status: Former    Types: Cigarettes    Quit date: 10/23/2016    Years since quitting: 4.6   Smokeless tobacco: Never   Tobacco comments:    per patient 5 cigarettes a day   Vaping Use   Vaping Use: Never used  Substance and Sexual Activity   Alcohol use: Not Currently    Comment: occasional   Drug use: No   Sexual activity: Not Currently  Other Topics Concern   Not on file  Social History Narrative   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: living with wife      Spiritual Beliefs: Christian      Lifestyle: no regular exercise, diet described as fair            Social Determinants of Health   Financial Resource Strain: Medium Risk   Difficulty of Paying Living Expenses: Somewhat hard  Food Insecurity: No Food Insecurity   Worried About Charity fundraiser in the Last Year: Never true   [place] in the Last Year: Never true  Transportation Needs: No Transportation Needs   Lack of Transportation (Medical): No   Lack of Transportation (Non-Medical): No  Physical Activity: Not on file  Stress: Not on file  Social Connections: Not on file  Intimate Partner Violence: Not on file     Review of Systems: General: negative for chills, fever, night sweats or weight changes.  Cardiovascular: negative for chest pain, dyspnea on exertion, edema, orthopnea, palpitations, paroxysmal nocturnal dyspnea or shortness of breath Dermatological: negative for rash Respiratory: negative for cough or wheezing Urologic: negative for hematuria Abdominal: negative for nausea, vomiting, diarrhea, bright red blood per rectum, melena, or hematemesis Neurologic: negative for visual  changes, syncope, or dizziness All other systems reviewed and are otherwise negative except as noted above.    Blood pressure (!) 140/92, pulse 76, height 5\' 10"  (1.778 m), weight 181 lb (82.1 kg).  General appearance: alert and no distress Neck: no adenopathy, no carotid bruit, no JVD, supple, symmetrical, trachea midline, and thyroid not enlarged, symmetric, no tenderness/mass/nodules Lungs: clear to auscultation bilaterally Heart: regular rate and rhythm, S1, S2 normal, no murmur, click, rub or gallop Extremities: extremities normal, atraumatic, no cyanosis or edema Pulses: 2+ and symmetric  Skin: Skin color, texture, turgor normal. No rashes or lesions Neurologic: Grossly normal  EKG Not performed today  ASSESSMENT AND  PLAN:   Claudication in peripheral vascular disease [facility]) Mr. Olivero returns today for follow-up of PAD.  He was initially sent to me by Dr. Debara Pickett for claudication.  I performed angiography on him 06/26/2020 revealing high-grade lesions in his proximal and mid right SFA as well as calcified subtotally occluded right popliteal artery and tibioperoneal trunk.  He had high-grade distal left SFA and popliteal artery stenosis as well.  Because of a narrow bifurcation I performed an antegrade stick and stented both his proximal and mid right SFA resulting in marked improvement in his Doppler studies.  His claudication resolved as well.  When I saw him last he was continuing to complain of some claudication type symptoms which markedly improved as well after the addition of Pletal.  Recent Doppler studies performed 04/02/2021 revealed a right ABI of 0.87 and a left of 0.77 with a widely patent right SFA stents.     Lorretta Harp MD FACP,FACC,FAHA, [facility] 05/29/2021 10:19 AM

## 2021-05-29 NOTE — Assessment & Plan Note (Signed)
Continues to follow with Dr. Lorenso Courier.

## 2021-05-30 ENCOUNTER — Other Ambulatory Visit: Payer: Self-pay | Admitting: Internal Medicine

## 2021-05-30 DIAGNOSIS — I2699 Other pulmonary embolism without acute cor pulmonale: Secondary | ICD-10-CM

## 2021-05-31 LAB — HEPATITIS B DNA, ULTRAQUANTITATIVE, PCR
Hepatitis B DNA (Calc): <1 Log IU/mL
Hepatitis B DNA: <10 IU/mL

## 2021-06-01 ENCOUNTER — Telehealth: Payer: Self-pay

## 2021-06-01 NOTE — Telephone Encounter (Signed)
-----   Message from Mignon Pine, DO sent at 06/01/2021 11:21 AM EDT ----- Please let patient know that his labs are reassuring.  His HBV DNA PCR was not detected.  Please continue Vemlidy for treatment and follow up with me in October.   Thanks

## 2021-06-01 NOTE — Telephone Encounter (Signed)
Advised patient per Dr. Juleen China that his labs are reassuring and hepatitis B viral load is undetectable. Relayed that Dr. Juleen China would like for him to continue [facility] and follow up in October. Patient verbalized understanding and has no further questions.   Beryle Flock, RN

## 2021-06-06 ENCOUNTER — Encounter: Payer: Self-pay | Admitting: Family Medicine

## 2021-06-13 ENCOUNTER — Other Ambulatory Visit: Payer: Self-pay | Admitting: Internal Medicine

## 2021-06-15 ENCOUNTER — Other Ambulatory Visit: Payer: Self-pay

## 2021-06-15 ENCOUNTER — Inpatient Hospital Stay: Payer: BC Managed Care – PPO

## 2021-06-15 ENCOUNTER — Inpatient Hospital Stay: Payer: BC Managed Care – PPO | Attending: Hematology and Oncology | Admitting: Hematology and Oncology

## 2021-06-15 ENCOUNTER — Other Ambulatory Visit: Payer: BC Managed Care – PPO

## 2021-06-15 ENCOUNTER — Inpatient Hospital Stay: Payer: BC Managed Care – PPO | Admitting: Dietician

## 2021-06-15 VITALS — BP 135/106

## 2021-06-15 VITALS — BP 134/106 | HR 101 | Temp 97.8°F | Resp 18 | Ht 70.0 in | Wt 176.0 lb

## 2021-06-15 DIAGNOSIS — M7989 Other specified soft tissue disorders: Secondary | ICD-10-CM | POA: Diagnosis not present

## 2021-06-15 DIAGNOSIS — Z8249 Family history of ischemic heart disease and other diseases of the circulatory system: Secondary | ICD-10-CM | POA: Diagnosis not present

## 2021-06-15 DIAGNOSIS — M25539 Pain in unspecified wrist: Secondary | ICD-10-CM | POA: Insufficient documentation

## 2021-06-15 DIAGNOSIS — R634 Abnormal weight loss: Secondary | ICD-10-CM | POA: Insufficient documentation

## 2021-06-15 DIAGNOSIS — Z5112 Encounter for antineoplastic immunotherapy: Secondary | ICD-10-CM | POA: Diagnosis not present

## 2021-06-15 DIAGNOSIS — E854 Organ-limited amyloidosis: Secondary | ICD-10-CM | POA: Diagnosis not present

## 2021-06-15 DIAGNOSIS — R5383 Other fatigue: Secondary | ICD-10-CM | POA: Diagnosis not present

## 2021-06-15 DIAGNOSIS — R682 Dry mouth, unspecified: Secondary | ICD-10-CM | POA: Insufficient documentation

## 2021-06-15 DIAGNOSIS — I10 Essential (primary) hypertension: Secondary | ICD-10-CM | POA: Insufficient documentation

## 2021-06-15 DIAGNOSIS — E8581 Light chain (AL) amyloidosis: Secondary | ICD-10-CM

## 2021-06-15 DIAGNOSIS — N049 Nephrotic syndrome with unspecified morphologic changes: Secondary | ICD-10-CM

## 2021-06-15 DIAGNOSIS — Z800 Family history of malignant neoplasm of digestive organs: Secondary | ICD-10-CM | POA: Diagnosis not present

## 2021-06-15 DIAGNOSIS — Z833 Family history of diabetes mellitus: Secondary | ICD-10-CM | POA: Diagnosis not present

## 2021-06-15 DIAGNOSIS — Z823 Family history of stroke: Secondary | ICD-10-CM | POA: Insufficient documentation

## 2021-06-15 DIAGNOSIS — Z79899 Other long term (current) drug therapy: Secondary | ICD-10-CM | POA: Diagnosis not present

## 2021-06-15 DIAGNOSIS — Z7901 Long term (current) use of anticoagulants: Secondary | ICD-10-CM | POA: Insufficient documentation

## 2021-06-15 DIAGNOSIS — E785 Hyperlipidemia, unspecified: Secondary | ICD-10-CM | POA: Insufficient documentation

## 2021-06-15 DIAGNOSIS — Z9481 Bone marrow transplant status: Secondary | ICD-10-CM | POA: Diagnosis not present

## 2021-06-15 DIAGNOSIS — Z87891 Personal history of nicotine dependence: Secondary | ICD-10-CM | POA: Insufficient documentation

## 2021-06-15 DIAGNOSIS — K580 Irritable bowel syndrome with diarrhea: Secondary | ICD-10-CM | POA: Diagnosis not present

## 2021-06-15 DIAGNOSIS — R0600 Dyspnea, unspecified: Secondary | ICD-10-CM | POA: Insufficient documentation

## 2021-06-15 DIAGNOSIS — Z86711 Personal history of pulmonary embolism: Secondary | ICD-10-CM | POA: Insufficient documentation

## 2021-06-15 DIAGNOSIS — R200 Anesthesia of skin: Secondary | ICD-10-CM | POA: Insufficient documentation

## 2021-06-15 LAB — CMP (CANCER CENTER ONLY)
ALT: 14 U/L (ref 0–44)
AST: 17 U/L (ref 15–41)
Albumin: 1.9 g/dL — ABNORMAL LOW (ref 3.5–5.0)
Alkaline Phosphatase: 88 U/L (ref 38–126)
Anion gap: 11 (ref 5–15)
BUN: 17 mg/dL (ref 8–23)
CO2: 29 mmol/L (ref 22–32)
Calcium: 8.8 mg/dL — ABNORMAL LOW (ref 8.9–10.3)
Chloride: 104 mmol/L (ref 98–111)
Creatinine: 1.39 mg/dL — ABNORMAL HIGH (ref 0.61–1.24)
GFR, Estimated: 58 mL/min — ABNORMAL LOW (ref >60)
Glucose, Bld: 90 mg/dL (ref 70–99)
Potassium: 3.3 mmol/L — ABNORMAL LOW (ref 3.5–5.1)
Sodium: 144 mmol/L (ref 135–145)
Total Bilirubin: 0.5 mg/dL (ref 0.3–1.2)
Total Protein: 4.9 g/dL — ABNORMAL LOW (ref 6.5–8.1)

## 2021-06-15 LAB — CBC WITH DIFFERENTIAL (CANCER CENTER ONLY)
Abs Immature Granulocytes: 0.01 10*3/uL (ref 0.00–0.07)
Basophils Absolute: 0 10*3/uL (ref 0.0–0.1)
Basophils Relative: 0 %
Eosinophils Absolute: 0.2 10*3/uL (ref 0.0–0.5)
Eosinophils Relative: 4 %
HCT: 30 % — ABNORMAL LOW (ref 39.0–52.0)
Hemoglobin: 10.2 g/dL — ABNORMAL LOW (ref 13.0–17.0)
Immature Granulocytes: 0 %
Lymphocytes Relative: 23 %
Lymphs Abs: 1 10*3/uL (ref 0.7–4.0)
MCH: 33 pg (ref 26.0–34.0)
MCHC: 34 g/dL (ref 30.0–36.0)
MCV: 97.1 fL (ref 80.0–100.0)
Monocytes Absolute: 0.3 10*3/uL (ref 0.1–1.0)
Monocytes Relative: 8 %
Neutro Abs: 2.9 10*3/uL (ref 1.7–7.7)
Neutrophils Relative %: 65 %
Platelet Count: 225 10*3/uL (ref 150–400)
RBC: 3.09 MIL/uL — ABNORMAL LOW (ref 4.22–5.81)
RDW: 13.4 % (ref 11.5–15.5)
WBC Count: 4.5 10*3/uL (ref 4.0–10.5)
nRBC: 0 % (ref 0.0–0.2)

## 2021-06-15 MED ORDER — DIPHENHYDRAMINE HCL 25 MG PO CAPS
ORAL_CAPSULE | ORAL | Status: AC
  Filled 2021-06-15: qty 2

## 2021-06-15 MED ORDER — DIPHENHYDRAMINE HCL 25 MG PO CAPS
50.0000 mg | ORAL_CAPSULE | Freq: Once | ORAL | Status: AC
  Administered 2021-06-15: 50 mg via ORAL

## 2021-06-15 MED ORDER — DEXAMETHASONE 4 MG PO TABS
ORAL_TABLET | ORAL | Status: AC
  Filled 2021-06-15: qty 5

## 2021-06-15 MED ORDER — ACETAMINOPHEN 325 MG PO TABS
ORAL_TABLET | ORAL | Status: AC
  Filled 2021-06-15: qty 2

## 2021-06-15 MED ORDER — DEXAMETHASONE 4 MG PO TABS
ORAL_TABLET | ORAL | Status: AC
  Filled 2021-06-15: qty 1

## 2021-06-15 MED ORDER — ACETAMINOPHEN 325 MG PO TABS
650.0000 mg | ORAL_TABLET | Freq: Once | ORAL | Status: AC
  Administered 2021-06-15: 650 mg via ORAL

## 2021-06-15 MED ORDER — DARATUMUMAB-HYALURONIDASE-FIHJ 1800-30000 MG-UT/15ML ~~LOC~~ SOLN
1800.0000 mg | Freq: Once | SUBCUTANEOUS | Status: AC
  Administered 2021-06-15: 1800 mg via SUBCUTANEOUS
  Filled 2021-06-15: qty 15

## 2021-06-15 MED ORDER — DEXAMETHASONE 4 MG PO TABS
20.0000 mg | ORAL_TABLET | Freq: Once | ORAL | Status: AC
  Administered 2021-06-15: 20 mg via ORAL

## 2021-06-15 NOTE — Progress Notes (Signed)
[place] [phone]   [phone]  PROGRESS NOTE  Patient Care Team: Samuel Butler, Samuel Halsted, MD as PCP - General (Internal Medicine) Lorretta Harp, MD as PCP - Cardiology (Cardiology)  Hematological/Oncological History # AL Amyloidosis of the Kidney 1) 08/03/2020: Free kappa 19.5, Lambda 205.7, Kappa/Lambda ratio: 0.09. M protein undetectable in serum.  2) 08/07/2020: kidney biopsy performed, results consistent with lambda light chain AL amyloidosis 3) 08/23/2020: establish care with Dr. [name]  4) 09/01/2020: bone marrow biopsy shows plasma cell neoplasm with focal amyloid deposits 5) 09/29/2020: start of Cycle 1 Day 1 of Dara-CyBorD 6) 11/02/2020: Cycle 2 Day 1 of Dara-CyBorD 7) 12/01/2020: Cycle 3 Day 1 of Dara-CyBorD 8) 12/29/2020: Cycle 4 Day 1 of Dara-CyBorD 9) 01/05/2021: Holding Velcade due to diarrhea.  10) 01/19/2021: Adding back Velcade due to improvement of diarrhea with imodium 11) 01/26/2021: Cycle 5 Day 1 of Dara-CyBorD 12) 02/23/2021: Cycle 6 Day 1 of Dara-CyBorD 13) 03/23/2021: Cycle 7 Day 1 of Dara-CyBorD 14) 04/20/2021: Cycle 8 Day 1 of Dara-CyBorD 15) 05/18/2021: Cycle 9 Day 1 of Dara maintenance 16) 06/15/2021: Cycle 10 Day 1 of Dara maintenance  Interval History:  Samuel Butler 60 y.o. male with medical history significant for AL amyloidosis of the kidney presents for a follow up visit. The patient's last visit was on 05/18/2021. In the interim since his last visit he has continued his treatment of Dara maintenance. Today, he is due for Cycle 10 Day 1.   On exam today Samuel Butler reports he has continued having weight loss since his last visit.  He is currently down 5 pounds from July 19.  He notes that the Imodium does help but his stool is still quite soft and he sees on rare occasions of some blood.  He notes that due to a strange taste in his mouth his appetite has decreased.  He notes that he simply does not have a desire and  has not been having any issues with nausea or vomiting.  He is doing his best to continue hydration as he has been suffering from dry mouth.  He has met with a nutritionist to discussed boost and Ensure.  He is having some unusual pain in his wrists and feet which is relatively new.  He also endorses continued fatigue. He denies any fevers, chills, night sweats, worsening shortness of breath, chest pain or cough.  He has no other complaints.  A full 10 point ROS is listed below.  MEDICAL HISTORY:  Past Medical History:  Diagnosis Date   Cancer [facility])    DDD (degenerative disc disease), lumbar    per his report   Diabetes mellitus without complication ([place])    Gout 04/16/2013   Hyperlipidemia    Hypertension    IBD (inflammatory bowel disease) 04/05/2016   Pulmonary embolism ([place])    Tobacco use disorder 03/28/2015    SURGICAL HISTORY: Past Surgical History:  Procedure Laterality Date   ABDOMINAL AORTOGRAM W/LOWER EXTREMITY Right 06/26/2020   Procedure: ABDOMINAL AORTOGRAM W/LOWER EXTREMITY;  Surgeon: Lorretta Harp, MD;  Location: [place] [facility];  Service: Cardiovascular;  Laterality: Right;   COLONOSCOPY     PERIPHERAL VASCULAR INTERVENTION  06/26/2020   Procedure: PERIPHERAL VASCULAR INTERVENTION;  Surgeon: Lorretta Harp, MD;  Location: [place] [facility];  Service: Cardiovascular;;  Right SFA   TONSILLECTOMY      SOCIAL HISTORY: Social History   Socioeconomic History   Marital status: Married  Spouse name: Not on file   Number of children: Not on file   Years of education: Not on file   Highest education level: Not on file  Occupational History   Not on file  Tobacco Use   Smoking status: Former    Types: Cigarettes    Quit date: 10/23/2016    Years since quitting: 4.6   Smokeless tobacco: Never   Tobacco comments:    per patient 5 cigarettes a day   Vaping Use   Vaping Use: Never used  Substance and Sexual Activity   Alcohol use: Not Currently     Comment: occasional   Drug use: No   Sexual activity: Not Currently  Other Topics Concern   Not on file  Social History Narrative   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: living with wife      Spiritual Beliefs: Christian      Lifestyle: no regular exercise, diet described as fair            Social Determinants of Health   Financial Resource Strain: Medium Risk   Difficulty of Paying Living Expenses: Somewhat hard  Food Insecurity: No Food Insecurity   Worried About Charity fundraiser in the Last Year: Never true   [place] in the Last Year: Never true  Transportation Needs: No Transportation Needs   Lack of Transportation (Medical): No   Lack of Transportation (Non-Medical): No  Physical Activity: Not on file  Stress: Not on file  Social Connections: Not on file  Intimate Partner Violence: Not on file    FAMILY HISTORY: Family History  Problem Relation Age of Onset   Hypertension Father    Diabetes Father    Stroke Father    Pancreatic cancer Father    Colon cancer Brother    Esophageal cancer Neg Hx    Stomach cancer Neg Hx     ALLERGIES:  is allergic to crestor [rosuvastatin calcium].  MEDICATIONS:  Current Outpatient Medications  Medication Sig Dispense Refill   acetaminophen (TYLENOL) 650 MG CR tablet Take 1,300 mg by mouth every 8 (eight) hours as needed for pain.      acyclovir (ZOVIRAX) 400 MG tablet Take 1 tablet by mouth twice daily 60 tablet 0   albuterol (VENTOLIN HFA) 108 (90 Base) MCG/ACT inhaler Inhale 2 puffs into the lungs every 6 (six) hours as needed for wheezing or shortness of breath. 8 g 0   atorvastatin (LIPITOR) 80 MG tablet Take 1 tablet by mouth once daily 90 tablet 0   cilostazol (PLETAL) 50 MG tablet Take 1 tablet (50 mg total) by mouth 2 (two) times daily. 60 tablet 3   clopidogrel (PLAVIX) 75 MG tablet Take 1 tablet (75 mg total) by mouth daily with breakfast. 90 tablet 3   dicyclomine (BENTYL) 20 MG  tablet TAKE 1 TABLET BY MOUTH 4 TIMES DAILY 20- 30 MINUTES BEFORE MEAL(S) AND AT BEDTIME 120 tablet 0   ELIQUIS 5 MG TABS tablet Take 1 tablet by mouth twice daily 180 tablet 0   Evolocumab (REPATHA SURECLICK) 497 MG/ML SOAJ Inject 140 mg into the skin every 14 (fourteen) days. 2 mL 12   ezetimibe (ZETIA) 10 MG tablet Take 1 tablet (10 mg total) by mouth daily. 90 tablet 3   isosorbide mononitrate (IMDUR) 30 MG 24 hr tablet Take 1 tablet by mouth once daily 90 tablet 1   loperamide (IMODIUM) 1 MG/5ML solution Take 2 mg  by mouth as needed for diarrhea or loose stools.     losartan (COZAAR) 50 MG tablet Take 1 tablet (50 mg total) by mouth daily. 90 tablet 3   metFORMIN (GLUCOPHAGE) 500 MG tablet Take 1 tablet by mouth once daily with breakfast 90 tablet 0   methylPREDNISolone (MEDROL DOSEPAK) 4 MG TBPK tablet Take as directed 1 each 0   ondansetron (ZOFRAN) 8 MG tablet TAKE 1 TABLET BY MOUTH EVERY 8 HOURS AS NEEDED FOR NAUSEA OR VOMITING 30 tablet 0   pantoprazole (PROTONIX) 40 MG tablet Take 1 tablet (40 mg total) by mouth daily. 90 tablet 1   potassium chloride SA (KLOR-CON) 20 MEQ tablet Take 2 tablets (40 mEq total) by mouth daily. 80MG x3 days then back to 40 mg/day 33 tablet 6   torsemide (DEMADEX) 20 MG tablet Take 1 tablet (20 mg total) by mouth 2 (two) times daily. 2m BID x 3 days then back to 284mBID 33 tablet 6   VEMLIDY 25 MG TABS Take 1 tablet (25 mg total) by mouth daily. 30 tablet 5   Vitamin D, Ergocalciferol, (DRISDOL) 1.25 MG (50000 UNIT) CAPS capsule Take 1 capsule by mouth once a week 12 capsule 0   Current Facility-Administered Medications  Medication Dose Route Frequency Provider Last Rate Last Admin   0.9 %  sodium chloride infusion  500 mL Intravenous Once StLadene ArtistMD        REVIEW OF SYSTEMS:   Constitutional: ( - ) fevers, ( - )  chills , ( - ) night sweats Eyes: ( - ) blurriness of vision, ( - ) double vision, ( - ) watery eyes Ears, nose, mouth, throat,  and face: ( - ) mucositis, ( - ) sore throat Respiratory: ( - ) cough, ( +) dyspnea, ( - ) wheezes Cardiovascular: ( - ) palpitation, ( - ) chest discomfort, ( +) lower extremity swelling Gastrointestinal:  ( - ) nausea, ( - ) heartburn, ( + ) change in bowel habits Skin: ( - ) abnormal skin rashes Lymphatics: ( - ) new lymphadenopathy, ( - ) easy bruising Neurological: (+) numbness, ( - ) tingling, ( - ) new weaknesses Behavioral/Psych: ( - ) mood change, ( - ) new changes  All other systems were reviewed with the patient and are negative.  PHYSICAL EXAMINATION: ECOG PERFORMANCE STATUS: 1 - Symptomatic but completely ambulatory  Vitals:   06/15/21 1021  BP: (!) 134/106  Pulse: (!) 101  Resp: 18  Temp: 97.8 F (36.6 C)  SpO2: 98%    Filed Weights   06/15/21 1021  Weight: 176 lb (79.8 kg)     GENERAL: well appearing middle aged AfSerbiamerican male alert, no distress and comfortable SKIN: skin color, texture, turgor are normal, no rashes or significant lesions EYES: conjunctiva are pink and non-injected, sclera clear LUNGS: clear to auscultation and percussion with normal breathing effort HEART: regular rate & rhythm and no murmurs and +2 lower extremity edema bilaterally  Musculoskeletal: no cyanosis of digits and no clubbing  PSYCH: alert & oriented x 3, fluent speech NEURO: no focal motor/sensory deficits  LABORATORY DATA:  I have reviewed the data as listed CBC Latest Ref Rng & Units 06/15/2021 05/18/2021 05/17/2021  WBC 4.0 - 10.5 K/uL 4.5 6.4 7.1  Hemoglobin 13.0 - 17.0 g/dL 10.2(L) 10.5(L) 11.2(L)  Hematocrit 39.0 - 52.0 % 30.0(L) 30.6(L) 34.9(L)  Platelets 150 - 400 K/uL 225 218 210    CMP Latest Ref Rng &  Units 06/15/2021 05/18/2021 05/17/2021  Glucose 70 - 99 mg/dL 90 148(H) 145(H)  BUN 8 - 23 mg/dL _0 Creatinine 0.61 - 1.24 mg/dL 1.39(H) 1.33(H) 1.36(H)  Sodium 135 - 145 mmol/L 144 144 143  Potassium 3.5 - 5.1 mmol/L 3.3(L) 3.7 3.3(L)  Chloride 98 - 111  mmol/L 104 104 102  CO2 22 - 32 mmol/L 29 28 32  Calcium 8.9 - 10.3 mg/dL 8.8(L) 8.6(L) 8.1(L)  Total Protein 6.5 - 8.1 g/dL 4.9(L) 5.3(L) 5.0(L)  Total Bilirubin 0.3 - 1.2 mg/dL 0.5 0.5 0.4  Alkaline Phos 38 - 126 U/L 88 82 77  AST 15 - 41 U/L _1 ALT 0 - 44 U/L _2 Lab Results  Component Value Date   MPROTEIN Not Observed 06/15/2021   MPROTEIN Not Observed 05/18/2021   MPROTEIN Not Observed 04/20/2021   Lab Results  Component Value Date   KPAFRELGTCHN 16.0 06/15/2021   KPAFRELGTCHN 19.2 05/18/2021   KPAFRELGTCHN 17.5 04/20/2021   LAMBDASER 17.8 06/15/2021   LAMBDASER 20.1 05/18/2021   LAMBDASER 20.0 04/20/2021   KAPLAMBRATIO 0.90 06/15/2021   KAPLAMBRATIO 1.57 (L) 05/21/2021   KAPLAMBRATIO 0.96 05/18/2021     RADIOGRAPHIC STUDIES: No results found.   ASSESSMENT & PLAN Samuel Butler 60 y.o. male with medical history significant for AL amyloidosis of the kidney presents for a follow up visit.  After review the labs, the records, discussion with the patient the findings are most consistent with AL amyloidosis affecting the kidney.  As such I recommended we start treatment with Dara-CyBorD.  After reviewing the biopsy results his findings are most consistent with AL amyloidosis.  As such the treatment of choice would be to target his plasma cell population with a triplet or quadruplet therapy.  Therapy of choice in this case would consist of daratumumab, Velcade, cyclophosphamide, and dexamethasone.  Given the patient's good functional status we will start with full dose Dara-CyBorD.  I previously discussed the side effects of this chemotherapy with the patient including neuropathy, elevated blood pressure, drop in blood counts, hypersensitivity reaction, chest tightness, increased infection risk, and fatigue.  The patient and family voiced their understanding of these findings and are agreeable to moving forward with quadruple therapy with the goal of being a  bridge to bone marrow transplant.    The regimen of choice is daratumumab, bortezomib, cyclophosphamide and dexamethasone per the ANDROMEDA Study ( Blood. 2020 Jul 2;136(1):71-80). Treatment consists of: Cyclophosphamide 300 mg/m2 intravenously and bortezomib 1.3 mg/m2 subcutaneously were given on days 1, 8, 15, and 22 of each 28 day cycle for up to 6 cycles. Dexamethasone 40 mg (starting dose) was given orally or intravenously weekly for each cycle for up to 6 cycles. DARA [employer] was administered in a single, premixed vial and given by manual [employer] injection over the course of 3 to 5 minutes weekly in cycles 1 to 2, every 2 weeks in cycles 3 to 6, and every 4 weeks thereafter as monotherapy for a maximum of 2 years. We would consider this continued dosing regimen.  On exam today,  Mr. Faulkner has continued to lose weight. He underwent colonoscopy on 03/28/2021 which showed no gross abnormalities but Congo red show involvement of the arterioles.  Labs from today were reviewed without any intervention needed.  Patient will proceed with treatment as planned.  Patient will return to the clinic in 4 weeks as scheduled.  #AL Amyloidosis Affecting the Kidney --bone marrow biopsy and  kidney biopsy helped to confirm the diagnosis of AL amyloidosis. --currently being treated with  Dara-CyBorD (per the [facility] Trial), started Cycle 1 Day 1 on 09/29/2020.  --today is Cycle 10 Day 1 of treatment. Moving forward the patient will only require monthly injections.  --will monitoring monthly SPEP, UPEP, and SFLC while on treatment. --will have patient return for weekly treatment with q 4 week clinic visits  #Diarrhea with abdominal cramping, improved --Improved with Imodium and Bentyl --GI EGD/Colonoscopy performed on 03/28/2021.  No gross abnormalities noted, however Congo red stain shows involvement of the arterioles but no the mucosal surface. The involvement of amyloid in causing his symptoms is uncertain.    #Weight  Loss, stable --Diarrhea combined with diuresis is likely causing his weight loss --weight decreased to 176 lbs, slow dropping --Under the care of dietician and drinking protein shakes --continue to monitor  #Supportive Care --chemotherapy education performed --zofran 70m q8H PRN and compazine 112mPO q6H for nausea --acyclovir 40024mO BID for VCZ prophylaxis --Imodium and Bentyl for diarrhea -- EMLA cream for port not required, patient declines port at this time, but will reconsider if patient desires.  -- no pain medication required at this time.   No orders of the defined types were placed in this encounter.  All questions were answered. The patient knows to call the clinic with any problems, questions or concerns.  A total of more than 30 minutes were spent on this encounter and over half of that time was spent on counseling and coordination of care as outlined above.   JohLedell PeoplesD Department of Hematology/Oncology ConMarathon WesNortheast Alabama Eye Surgery Centerone: [phone]: [phone]: [email or web address]   06/21/2021 10:19 AM   Literature Support:   V, Clarita Craneasey SY, Weiss BM, VerRush Landmarkerlini G, Comenzo RL. Daratumumab plus CyBorD for patients with newly diagnosed AL amyloidosis: safety run-in results of ANDROMEDA. Blood. 2020 Jul 2;136(1):71-80.   --Daratumumab-CyBorD was well tolerated, with no new safety concerns versus the intravenous formulation, and demonstrated robust hematologic and organ responses.

## 2021-06-15 NOTE — Patient Instructions (Signed)
[facility]  Discharge Instructions: Thank you for choosing [facility] to provide your oncology and hematology care.   If you have a lab appointment with the Cancer Center, please go directly to the Cancer Center and check in at the registration area.   Wear comfortable clothing and clothing appropriate for easy access to any Portacath or PICC line.   We strive to give you quality time with your provider. You may need to reschedule your appointment if you arrive late (15 or more minutes).  Arriving late affects you and other patients whose appointments are after yours.  Also, if you miss three or more appointments without notifying the office, you may be dismissed from the clinic at the provider's discretion.      For prescription refill requests, have your pharmacy contact our office and allow 72 hours for refills to be completed.    Today you received the following chemotherapy and/or immunotherapy agent: Daratumumab      To help prevent nausea and vomiting after your treatment, we encourage you to take your nausea medication as directed.  BELOW ARE SYMPTOMS THAT SHOULD BE REPORTED IMMEDIATELY: *FEVER GREATER THAN 100.4 F (38 C) OR HIGHER *CHILLS OR SWEATING *NAUSEA AND VOMITING THAT IS NOT CONTROLLED WITH YOUR NAUSEA MEDICATION *UNUSUAL SHORTNESS OF BREATH *UNUSUAL BRUISING OR BLEEDING *URINARY PROBLEMS (pain or burning when urinating, or frequent urination) *BOWEL PROBLEMS (unusual diarrhea, constipation, pain near the anus) TENDERNESS IN MOUTH AND THROAT WITH OR WITHOUT PRESENCE OF ULCERS (sore throat, sores in mouth, or a toothache) UNUSUAL RASH, SWELLING OR PAIN  UNUSUAL VAGINAL DISCHARGE OR ITCHING   Items with * indicate a potential emergency and should be followed up as soon as possible or go to the Emergency Department if any problems should occur.  Please show the CHEMOTHERAPY ALERT CARD or IMMUNOTHERAPY ALERT CARD at check-in to  the Emergency Department and triage nurse.  Should you have questions after your visit or need to cancel or reschedule your appointment, please contact [facility]  Dept: [phone]  and follow the prompts.  Office hours are 8:00 a.m. to 4:30 p.m. Monday - Friday. Please note that voicemails left after 4:00 p.m. may not be returned until the following business day.  We are closed weekends and major holidays. You have access to a nurse at all times for urgent questions. Please call the main number to the clinic Dept: [phone] and follow the prompts.   For any non-urgent questions, you may also contact your provider using MyChart. We now offer e-Visits for anyone 18 and older to request care online for non-urgent symptoms. For details visit [email or web address].   Also download the MyChart app! Go to the app store, search "MyChart", open the app, select [facility], and log in with your MyChart username and password.  Due to Covid, a mask is required upon entering the hospital/clinic. If you do not have a mask, one will be given to you upon arrival. For doctor visits, patients may have 1 support person aged 18 or older with them. For treatment visits, patients cannot have anyone with them due to current Covid guidelines and our immunocompromised population.   

## 2021-06-15 NOTE — Progress Notes (Signed)
Per Dr. Lorenso Courier, ok to treat with BP readings today.

## 2021-06-18 LAB — KAPPA/LAMBDA LIGHT CHAINS
Kappa free light chain: 16 mg/L (ref 3.3–19.4)
Kappa, lambda light chain ratio: 0.9 (ref 0.26–1.65)
Lambda free light chains: 17.8 mg/L (ref 5.7–26.3)

## 2021-06-19 ENCOUNTER — Other Ambulatory Visit: Payer: Self-pay | Admitting: *Deleted

## 2021-06-19 DIAGNOSIS — Z5112 Encounter for antineoplastic immunotherapy: Secondary | ICD-10-CM | POA: Diagnosis not present

## 2021-06-19 DIAGNOSIS — E8581 Light chain (AL) amyloidosis: Secondary | ICD-10-CM

## 2021-06-19 LAB — MULTIPLE MYELOMA PANEL, SERUM
Albumin SerPl Elph-Mcnc: 2.1 g/dL — ABNORMAL LOW (ref 2.9–4.4)
Albumin/Glob SerPl: 0.9 (ref 0.7–1.7)
Alpha 1: 0.2 g/dL (ref 0.0–0.4)
Alpha2 Glob SerPl Elph-Mcnc: 1.2 g/dL — ABNORMAL HIGH (ref 0.4–1.0)
B-Globulin SerPl Elph-Mcnc: 0.7 g/dL (ref 0.7–1.3)
Gamma Glob SerPl Elph-Mcnc: 0.2 g/dL — ABNORMAL LOW (ref 0.4–1.8)
Globulin, Total: 2.4 g/dL (ref 2.2–3.9)
IgA: 55 mg/dL — ABNORMAL LOW (ref 61–437)
IgG (Immunoglobin G), Serum: 197 mg/dL — ABNORMAL LOW (ref 603–1613)
IgM (Immunoglobulin M), Srm: 20 mg/dL (ref 20–172)
Total Protein ELP: 4.5 g/dL — ABNORMAL LOW (ref 6.0–8.5)

## 2021-06-21 ENCOUNTER — Encounter: Payer: Self-pay | Admitting: Hematology and Oncology

## 2021-06-21 LAB — UPEP/UIFE/LIGHT CHAINS/TP, 24-HR UR
% BETA, Urine: 7.7 %
ALPHA 1 URINE: 7 %
Albumin, U: 82.3 %
Alpha 2, Urine: 2.6 %
Free Kappa Lt Chains,Ur: 20.64 mg/L (ref 1.17–86.46)
Free Kappa/Lambda Ratio: 1.66 — ABNORMAL LOW (ref 1.83–14.26)
Free Lambda Lt Chains,Ur: 12.45 mg/L (ref 0.27–15.21)
GAMMA GLOBULIN URINE: 0.3 %
Total Protein, Urine-Ur/day: 3701 mg/24 hr — ABNORMAL HIGH (ref 30–150)
Total Protein, Urine: 231.3 mg/dL
Total Volume: 1600

## 2021-06-22 ENCOUNTER — Ambulatory Visit: Payer: BC Managed Care – PPO | Admitting: Cardiovascular Disease

## 2021-06-25 ENCOUNTER — Other Ambulatory Visit: Payer: Self-pay | Admitting: Physician Assistant

## 2021-06-25 ENCOUNTER — Other Ambulatory Visit: Payer: Self-pay | Admitting: Hematology and Oncology

## 2021-06-25 ENCOUNTER — Encounter: Payer: Self-pay | Admitting: Hematology and Oncology

## 2021-06-27 ENCOUNTER — Telehealth: Payer: Self-pay | Admitting: Physician Assistant

## 2021-06-27 NOTE — Telephone Encounter (Signed)
I called and spoke to patient's wife, Samuel Butler as she had inquired about the endoscopy pathology report.  I reviewed the endoscopy results that did not show any gross findings however the biopsy results did not show involvement of the arterioles and not mucosal surface.  It is uncertain if amyloid involvement in the GI tract is contributing to patient's symptoms with diarrhea and abdominal cramping.  He continues to take Imodium and Bentyl. Patient's wife feels that his symptoms have worsened in the past month.  Patient is hesitant to eat as he is concerned with symptoms of cramping and diarrhea.  Patient's wife inquiring if there is any psychological component that is causing his symptoms.  I suggested for patient to get evaluated by her Education officer, museum.  Additionally I will reach out to gastroenterology for follow-up.  Patient's wife expressed understanding and satisfaction with plan provided.

## 2021-06-28 ENCOUNTER — Other Ambulatory Visit: Payer: Self-pay | Admitting: *Deleted

## 2021-06-28 DIAGNOSIS — E8581 Light chain (AL) amyloidosis: Secondary | ICD-10-CM

## 2021-06-29 ENCOUNTER — Telehealth: Payer: Self-pay | Admitting: Dietician

## 2021-06-29 ENCOUNTER — Other Ambulatory Visit: Payer: Self-pay | Admitting: *Deleted

## 2021-06-29 ENCOUNTER — Encounter: Payer: Self-pay | Admitting: Physician Assistant

## 2021-06-29 ENCOUNTER — Encounter: Payer: Self-pay | Admitting: General Practice

## 2021-06-29 ENCOUNTER — Inpatient Hospital Stay: Payer: BC Managed Care – PPO | Admitting: Dietician

## 2021-06-29 DIAGNOSIS — E8581 Light chain (AL) amyloidosis: Secondary | ICD-10-CM

## 2021-06-29 NOTE — Progress Notes (Addendum)
[place] Work  Initial Assessment   Samuel Butler is a 60 y.o. year old male contacted by phone. Clinical Social Work was referred by Phineas Semen PA, medical oncologist,  for assessment of psychosocial needs.   SDOH (Social Determinants of Health) assessments performed: Yes   Distress Screen completed: Yes ONCBCN DISTRESS SCREENING 10/06/2020  Screening Type Initial Screening  Distress experienced in past week (1-10) 2  Physical Problem type Other (comment)  Physician notified of physical symptoms No  Referral to clinical psychology No  Referral to clinical social work No  Referral to dietition No  Referral to financial advocate No  Referral to support programs No  Referral to palliative care No      Family/Social Information:  Housing Arrangement: patient lives with wife,  concerned about ability to pay his mortgage as his income has been significantly reduced Family members/support persons in your life? Family Transportation concerns: no  Employment: Disabled. 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   Type of concern:  "multiple financial concerns", wife is able to work, but disability income is insufficient to cover household bills which is adding to the stress.   Medical bills are his biggest concern.  Has wiped out his retirement account to pay mortgage Food access concerns: yes, finances are very strained.  They are getting Food Stamps now, but this may change when wife returns to work.   Religious or spiritual practice: yes Medication Concerns: no  Services Currently in place:  none  Coping/ Adjustment to diagnosis: Patient understands treatment plan and what happens next? Yes, diagnosed approx 9  months ago with amyloidosis.  Concerning symptoms include fatigue, weight loss, abnormal proteins in blood/tissue causing leg heaviness and weakness.  "I can do something for a little while but then I get too tired."  Edema in lower extremities is also concerning, has had two stents put in.  Continues to have foot pain, neuropathy and similar concerning symptoms particularly in left leg.  "Its just a lot that has been going on in the last 9 months."  Prior to diagnosis, he was working 6 AM - 3:30 PM, would come home and cut grass/do other kinds of work in yard and home.  Had some joint pain at night, but was managing pain and it was not life limiting.  "Now if I get sick, have a flare up, I am down for 2 - 3 days.  Is now out on long term disability.   Concerns about diagnosis and/or treatment:  fatigue, inability to keep weight on, bloody diarrhea, multiple stomach problems.  Patient reported stressors: Housing, Publishing rights manager, Haematologist, and Physical issues Hopes and priorities: looks forward to being mobile again, "it is hard for me to walk", legs hurt too much, wife is afraid he will fall.  Went to outpatient PT for approx 3 months, "it helped but it didn't help",  no lasting change.   Patient enjoys time with family/ friends and showers/rests/tries to work in yard if he feels strong enough;  Current coping skills/ strengths: Ability for insight, Capable of independent living, Armed forces logistics/support/administrative officer, Solicitor fund of knowledge, Motivation for treatment/growth, and Supportive family/friends    SUMMARY: Current SDOH Barriers:  Financial constraints related to loss of income due  to disease  Clinical Social Work Clinical Goal(s):  explore community resource options for unmet needs related [id number] constraints   Interventions: Discussed common feeling and emotions when being diagnosed with cancer, and the importance of support during treatment Informed patient of the support team roles and support services at  [facility] Provided [place] contact information and encouraged patient to call with any questions or concerns Referred patient to [facility], [employer], Mining engineer, Production assistant, radio.  Also referred to Dr Conception Chancy, [place] for ongoing counseling   Referred to [facility]   Follow Up Plan: CSW will follow-up with patient by phone  in one week Patient verbalizes understanding of plan: Yes    Beverely Pace , [place], [place] Worker Phone:  [phone]

## 2021-07-02 ENCOUNTER — Encounter: Payer: Self-pay | Admitting: Hematology and Oncology

## 2021-07-02 NOTE — Progress Notes (Signed)
Called pt to introduce myself as his Arboriculturist.  Pt has reached his deductible for the year so his ins is paying for his treatment at 100%.  I informed him of the [employer], went over what it covers and gave him the income requirement.  Pt stated he exceeds that amount so he doesn't qualify for the grant at this time.

## 2021-07-02 NOTE — Telephone Encounter (Signed)
Nutrition Follow-up:  Patient with AL amyloidosis  of kidney. He is receiving Dara-CyBorD  Spoke with patient and wife via telephone. He reports ongoing diarrhea, he is taking Imodium which helps some. Patient reports diarrhea occurs 15-30 minutes after eating, says it doesn't really matter what he eats. Patient reports he has blood in stool, this occurs with every bowel movement. Patient reports this has been going on for the past couple of weeks, but has not reported this, says he forgot. Wife is sending myChart message to oncologist to report symptoms. His appetite is poor, wife reports he has had some fruit this morning. Patient is drinking Boost sometimes, he only likes the chocolate flavor. Discussed fairlife milk as lactose free dairy option. Wife reports patient is reluctant to try new food, make changes to diet if he knows it is something different.    Medications: reviewed  Labs: 8/5 K 3.3, Cr 1.39  Anthropometrics: Last weight 176 lb on 8/5 decreased from 181 lb 9.6 oz on 7/19 and 194 lb on 7/6. Patient weighed 199 lb 1.6 oz on 6/10. Patient has lost 23 lbs (11.6%) in 2 months which is significant.    NUTRITION DIAGNOSIS: Unintentional weight loss ongoing   MALNUTRITION DIAGNOSIS: Suspect patient likely to meet criteria for degree of malnutrition given ongoing diarrhea and significant weight loss, however unable to complete nutrition focused physical exam at this time to identify fat/muscle depletions   INTERVENTION:  Reviewed strategies for diarrhea, encouraged low fiber foods, avoiding greasy, fatty foods, recommended laying down ~20-30 minutes after eating - provided additional handouts and recipes for pick up  Suggested trial of Enterade - samples left at registration desk for pick up Continue Imodium as needed Provided alternate supplement ideas (CIB mixed with lactose free fairlife whole milk, fairlife shakes, Dillard [place], [employer])  Discussed importance of reporting  symptoms, encouraged Wife will contact MD to report blood in stool Patient has contact information    MONITORING, EVALUATION, GOAL: weight trends, intake   NEXT VISIT: Friday September 2 in infusion

## 2021-07-03 ENCOUNTER — Telehealth: Payer: Self-pay

## 2021-07-03 NOTE — Telephone Encounter (Signed)
Spoke with patient, he has been scheduled for a follow up with Dr. Fuller Plan on Wednesday, 08/15/21 at 10:30 am. Advised patient to call us in the interim if his symptoms worsen. Advised patient to call back periodically to see if Dr. Fuller Plan has had any cancellations. Pt had no concerns at the end of the call.

## 2021-07-03 NOTE — Telephone Encounter (Signed)
-----   Message from Levin Erp, [place] sent at 07/02/2021 10:07 AM EDT ----- Regarding: FW: F/U request Can you get patient follow up with dr stark please. Thanks-JLL ----- Message ----- From: Cordelia Poche Sent: 06/27/2021   3:34 PM EDT To: Levin Erp, PA Subject: F/U request                                    Farris Has,   Mr. Fee was evaluated in July due to chronic diarrhea and abdominal cramping. Patient's wife reached out to [place] noting that his symptoms have worsened in the past few weeks. He is compliant with taking imodium and bentyl. Would your team be able to follow up with the patient to discuss his symptoms?  Thanks, Murray Hodgkins

## 2021-07-06 ENCOUNTER — Inpatient Hospital Stay: Payer: BC Managed Care – PPO | Admitting: General Practice

## 2021-07-06 DIAGNOSIS — E8581 Light chain (AL) amyloidosis: Secondary | ICD-10-CM

## 2021-07-06 NOTE — Progress Notes (Addendum)
[place] CSW Progress Notes  Call to patient to follow up.  He has spoken w Estate manager/land agent and is over income for [employer].  He has not heard from [employer], sent referral again.  He did speak w Dr Michail Sermon from [place] and thinks he has an appt in October - CSW will verify this.  He is feeling somewhat better after seeing specialist who provided some relief for foot pain, he is walking w more ease.  CSW will see him in person next Friday in infusion.  Per message from [facility] referral coordinator, patient has opened their paperwork but has not completed it, thus no appointment has been set.  CSW will remind him to complete the process when she sees him in infusion  Edwyna Shell, [place] Worker Phone:  [phone]

## 2021-07-13 ENCOUNTER — Other Ambulatory Visit: Payer: Self-pay

## 2021-07-13 ENCOUNTER — Inpatient Hospital Stay: Payer: BC Managed Care – PPO | Admitting: Dietician

## 2021-07-13 ENCOUNTER — Inpatient Hospital Stay: Payer: BC Managed Care – PPO

## 2021-07-13 ENCOUNTER — Inpatient Hospital Stay: Payer: BC Managed Care – PPO | Attending: Hematology and Oncology | Admitting: Hematology and Oncology

## 2021-07-13 ENCOUNTER — Inpatient Hospital Stay: Payer: BC Managed Care – PPO | Admitting: General Practice

## 2021-07-13 VITALS — BP 134/105 | HR 100 | Temp 97.7°F | Resp 19 | Wt 179.6 lb

## 2021-07-13 VITALS — BP 135/106 | HR 98

## 2021-07-13 DIAGNOSIS — R634 Abnormal weight loss: Secondary | ICD-10-CM | POA: Diagnosis not present

## 2021-07-13 DIAGNOSIS — R200 Anesthesia of skin: Secondary | ICD-10-CM | POA: Insufficient documentation

## 2021-07-13 DIAGNOSIS — I10 Essential (primary) hypertension: Secondary | ICD-10-CM | POA: Diagnosis not present

## 2021-07-13 DIAGNOSIS — E785 Hyperlipidemia, unspecified: Secondary | ICD-10-CM | POA: Diagnosis not present

## 2021-07-13 DIAGNOSIS — M7989 Other specified soft tissue disorders: Secondary | ICD-10-CM | POA: Diagnosis not present

## 2021-07-13 DIAGNOSIS — Z5112 Encounter for antineoplastic immunotherapy: Secondary | ICD-10-CM | POA: Insufficient documentation

## 2021-07-13 DIAGNOSIS — Z833 Family history of diabetes mellitus: Secondary | ICD-10-CM | POA: Insufficient documentation

## 2021-07-13 DIAGNOSIS — Z79899 Other long term (current) drug therapy: Secondary | ICD-10-CM | POA: Diagnosis not present

## 2021-07-13 DIAGNOSIS — R194 Change in bowel habit: Secondary | ICD-10-CM | POA: Insufficient documentation

## 2021-07-13 DIAGNOSIS — N049 Nephrotic syndrome with unspecified morphologic changes: Secondary | ICD-10-CM

## 2021-07-13 DIAGNOSIS — E854 Organ-limited amyloidosis: Secondary | ICD-10-CM

## 2021-07-13 DIAGNOSIS — Z87891 Personal history of nicotine dependence: Secondary | ICD-10-CM | POA: Diagnosis not present

## 2021-07-13 DIAGNOSIS — Z823 Family history of stroke: Secondary | ICD-10-CM | POA: Diagnosis not present

## 2021-07-13 DIAGNOSIS — R109 Unspecified abdominal pain: Secondary | ICD-10-CM | POA: Diagnosis not present

## 2021-07-13 DIAGNOSIS — Z800 Family history of malignant neoplasm of digestive organs: Secondary | ICD-10-CM | POA: Diagnosis not present

## 2021-07-13 DIAGNOSIS — E8581 Light chain (AL) amyloidosis: Secondary | ICD-10-CM

## 2021-07-13 DIAGNOSIS — R197 Diarrhea, unspecified: Secondary | ICD-10-CM | POA: Diagnosis not present

## 2021-07-13 DIAGNOSIS — R0600 Dyspnea, unspecified: Secondary | ICD-10-CM | POA: Insufficient documentation

## 2021-07-13 DIAGNOSIS — Z8249 Family history of ischemic heart disease and other diseases of the circulatory system: Secondary | ICD-10-CM | POA: Insufficient documentation

## 2021-07-13 LAB — CMP (CANCER CENTER ONLY)
ALT: 21 U/L (ref 0–44)
AST: 22 U/L (ref 15–41)
Albumin: 2.1 g/dL — ABNORMAL LOW (ref 3.5–5.0)
Alkaline Phosphatase: 82 U/L (ref 38–126)
Anion gap: 11 (ref 5–15)
BUN: 18 mg/dL (ref 8–23)
CO2: 27 mmol/L (ref 22–32)
Calcium: 8.6 mg/dL — ABNORMAL LOW (ref 8.9–10.3)
Chloride: 108 mmol/L (ref 98–111)
Creatinine: 1.36 mg/dL — ABNORMAL HIGH (ref 0.61–1.24)
GFR, Estimated: 59 mL/min — ABNORMAL LOW (ref >60)
Glucose, Bld: 92 mg/dL (ref 70–99)
Potassium: 3.7 mmol/L (ref 3.5–5.1)
Sodium: 146 mmol/L — ABNORMAL HIGH (ref 135–145)
Total Bilirubin: 0.4 mg/dL (ref 0.3–1.2)
Total Protein: 4.9 g/dL — ABNORMAL LOW (ref 6.5–8.1)

## 2021-07-13 LAB — CBC WITH DIFFERENTIAL (CANCER CENTER ONLY)
Abs Immature Granulocytes: 0.01 10*3/uL (ref 0.00–0.07)
Basophils Absolute: 0 10*3/uL (ref 0.0–0.1)
Basophils Relative: 0 %
Eosinophils Absolute: 0.3 10*3/uL (ref 0.0–0.5)
Eosinophils Relative: 7 %
HCT: 31.8 % — ABNORMAL LOW (ref 39.0–52.0)
Hemoglobin: 10.7 g/dL — ABNORMAL LOW (ref 13.0–17.0)
Immature Granulocytes: 0 %
Lymphocytes Relative: 26 %
Lymphs Abs: 1.2 10*3/uL (ref 0.7–4.0)
MCH: 32.4 pg (ref 26.0–34.0)
MCHC: 33.6 g/dL (ref 30.0–36.0)
MCV: 96.4 fL (ref 80.0–100.0)
Monocytes Absolute: 0.4 10*3/uL (ref 0.1–1.0)
Monocytes Relative: 9 %
Neutro Abs: 2.7 10*3/uL (ref 1.7–7.7)
Neutrophils Relative %: 58 %
Platelet Count: 217 10*3/uL (ref 150–400)
RBC: 3.3 MIL/uL — ABNORMAL LOW (ref 4.22–5.81)
RDW: 13.9 % (ref 11.5–15.5)
WBC Count: 4.7 10*3/uL (ref 4.0–10.5)
nRBC: 0 % (ref 0.0–0.2)

## 2021-07-13 MED ORDER — DEXAMETHASONE 4 MG PO TABS
20.0000 mg | ORAL_TABLET | Freq: Once | ORAL | Status: AC
  Administered 2021-07-13: 20 mg via ORAL
  Filled 2021-07-13: qty 5

## 2021-07-13 MED ORDER — DARATUMUMAB-HYALURONIDASE-FIHJ 1800-30000 MG-UT/15ML ~~LOC~~ SOLN
1800.0000 mg | Freq: Once | SUBCUTANEOUS | Status: AC
  Administered 2021-07-13: 1800 mg via SUBCUTANEOUS
  Filled 2021-07-13: qty 15

## 2021-07-13 MED ORDER — ACETAMINOPHEN 325 MG PO TABS
650.0000 mg | ORAL_TABLET | Freq: Once | ORAL | Status: AC
  Administered 2021-07-13: 650 mg via ORAL
  Filled 2021-07-13: qty 2

## 2021-07-13 MED ORDER — DIPHENHYDRAMINE HCL 25 MG PO CAPS
50.0000 mg | ORAL_CAPSULE | Freq: Once | ORAL | Status: AC
  Administered 2021-07-13: 50 mg via ORAL
  Filled 2021-07-13: qty 2

## 2021-07-13 NOTE — Progress Notes (Signed)
[place] Telephone:([phone]) [phone]   Fax:([phone]) [phone]  PROGRESS NOTE  Patient Care Team: Samuel Butler, Samuel Halsted, MD as PCP - General (Internal Medicine) Samuel Harp, MD as PCP - Cardiology (Cardiology)  Hematological/Oncological History # AL Amyloidosis of the Kidney 1) 08/03/2020: Free kappa 19.5, Lambda 205.7, Kappa/Lambda ratio: 0.09. M protein undetectable in serum.  2) 08/07/2020: kidney biopsy performed, results consistent with lambda light chain AL amyloidosis 3) 08/23/2020: establish care with Samuel Butler  4) 09/01/2020: bone marrow biopsy shows plasma cell neoplasm with focal amyloid deposits 5) 09/29/2020: start of Cycle 1 Day 1 of Dara-CyBorD 6) 11/02/2020: Cycle 2 Day 1 of Dara-CyBorD 7) 12/01/2020: Cycle 3 Day 1 of Dara-CyBorD 8) 12/29/2020: Cycle 4 Day 1 of Dara-CyBorD 9) 01/05/2021: Holding Velcade due to diarrhea.  10) 01/19/2021: Adding back Velcade due to improvement of diarrhea with imodium 11) 01/26/2021: Cycle 5 Day 1 of Dara-CyBorD 12) 02/23/2021: Cycle 6 Day 1 of Dara-CyBorD 13) 03/23/2021: Cycle 7 Day 1 of Dara-CyBorD 14) 04/20/2021: Cycle 8 Day 1 of Dara-CyBorD 15) 05/18/2021: Cycle 9 Day 1 of Dara maintenance 16) 06/15/2021: Cycle 10 Day 1 of Dara maintenance 17) 07/13/2021: Cycle 11 Day 1 of Dara maintenance  Interval History:  Samuel Butler 60 y.o. male with medical history significant for AL amyloidosis of the kidney presents for a follow up visit. The patient's last visit was on 06/15/2021. In the interim since his last visit he has continued his treatment of Dara maintenance. Today, he is due for Cycle 11 Day 1.   On exam today Samuel Butler reports there is concern for gout in his left foot.  He is having pain in his toe up to the top of his foot.  He currently has follow-up scheduled with rheumatology.  He notes that he is having some blood on toilet paper and some occasional blood in his stool.  He notes his bowel movements are nonpainful and  he has been taking Imodium in order to help slow him down.  He is also given fiber in order to help bulk up his stools.  His weight has increased by 3 pounds today and he is optimistic about that.  He denies any bleeding from any other sources.  He denies any fevers, chills, night sweats, worsening shortness of breath, chest pain or cough.  He has no other complaints.  A full 10 point ROS is listed below.  MEDICAL HISTORY:  Past Medical History:  Diagnosis Date   Cancer [facility])    DDD (degenerative disc disease), lumbar    per his report   Diabetes mellitus without complication ([place])    Gout 04/16/2013   Hyperlipidemia    Hypertension    IBD (inflammatory bowel disease) 04/05/2016   Pulmonary embolism ([place])    Tobacco use disorder 03/28/2015    SURGICAL HISTORY: Past Surgical History:  Procedure Laterality Date   ABDOMINAL AORTOGRAM W/LOWER EXTREMITY Right 06/26/2020   Procedure: ABDOMINAL AORTOGRAM W/LOWER EXTREMITY;  Surgeon: Samuel Harp, MD;  Location: [place] [facility];  Service: Cardiovascular;  Laterality: Right;   COLONOSCOPY     PERIPHERAL VASCULAR INTERVENTION  06/26/2020   Procedure: PERIPHERAL VASCULAR INTERVENTION;  Surgeon: Samuel Harp, MD;  Location: [place] [facility];  Service: Cardiovascular;;  Right SFA   TONSILLECTOMY      SOCIAL HISTORY: Social History   Socioeconomic History   Marital status: Married    Spouse name: Not on file   Number of children: Not on  file   Years of education: Not on file   Highest education level: Not on file  Occupational History   Not on file  Tobacco Use   Smoking status: Former    Types: Cigarettes    Quit date: 10/23/2016    Years since quitting: 4.7   Smokeless tobacco: Never   Tobacco comments:    per patient 5 cigarettes a day   Vaping Use   Vaping Use: Never used  Substance and Sexual Activity   Alcohol use: Not Currently    Comment: occasional   Drug use: No   Sexual activity: Not Currently  Other  Topics Concern   Not on file  Social History Narrative   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: living with wife      Spiritual Beliefs: Christian      Lifestyle: no regular exercise, diet described as fair            Social Determinants of Health   Financial Resource Strain: Medium Risk   Difficulty of Paying Living Expenses: Somewhat hard  Food Insecurity: No Food Insecurity   Worried About Charity fundraiser in the Last Year: Never true   Ran Out of Food in the Last Year: Never true  Transportation Needs: No Transportation Needs   Lack of Transportation (Medical): No   Lack of Transportation (Non-Medical): No  Physical Activity: Not on file  Stress: Not on file  Social Connections: Not on file  Intimate Partner Violence: Not on file    FAMILY HISTORY: Family History  Problem Relation Age of Onset   Hypertension Father    Diabetes Father    Stroke Father    Pancreatic cancer Father    Colon cancer Brother    Esophageal cancer Neg Hx    Stomach cancer Neg Hx     ALLERGIES:  is allergic to crestor [rosuvastatin calcium].  MEDICATIONS:  Current Outpatient Medications  Medication Sig Dispense Refill   acetaminophen (TYLENOL) 650 MG CR tablet Take 1,300 mg by mouth every 8 (eight) hours as needed for pain.      acyclovir (ZOVIRAX) 400 MG tablet Take 1 tablet by mouth twice daily 60 tablet 0   albuterol (VENTOLIN HFA) 108 (90 Base) MCG/ACT inhaler Inhale 2 puffs into the lungs every 6 (six) hours as needed for wheezing or shortness of breath. 8 g 0   atorvastatin (LIPITOR) 80 MG tablet Take 1 tablet by mouth once daily 90 tablet 0   cilostazol (PLETAL) 50 MG tablet Take 1 tablet (50 mg total) by mouth 2 (two) times daily. 60 tablet 3   clopidogrel (PLAVIX) 75 MG tablet Take 1 tablet (75 mg total) by mouth daily with breakfast. 90 tablet 3   dicyclomine (BENTYL) 20 MG tablet TAKE 1 TABLET BY MOUTH 4 TIMES DAILY 20  TO  30  MINUTES  BEFORE  MEAL(S)   AND  AT  BEDTIME 120 tablet 3   ELIQUIS 5 MG TABS tablet Take 1 tablet by mouth twice daily 180 tablet 0   Evolocumab (REPATHA SURECLICK) 027 MG/ML SOAJ Inject 140 mg into the skin every 14 (fourteen) days. 2 mL 12   ezetimibe (ZETIA) 10 MG tablet Take 1 tablet (10 mg total) by mouth daily. 90 tablet 3   HYDROcodone-acetaminophen (NORCO) 7.5-325 MG tablet hydrocodone 7.5 mg-acetaminophen 325 mg tablet  TAKE 1 TABLET BY MOUTH EVERY 6 HOURS AS NEEDED FOR MODERATE PAIN FOR UP TO 5 DAYS  isosorbide mononitrate (IMDUR) 30 MG 24 hr tablet Take 1 tablet by mouth once daily 90 tablet 1   loperamide (IMODIUM) 1 MG/5ML solution Take 2 mg by mouth as needed for diarrhea or loose stools.     losartan (COZAAR) 50 MG tablet Take 1 tablet (50 mg total) by mouth daily. 90 tablet 3   metFORMIN (GLUCOPHAGE) 500 MG tablet Take 1 tablet by mouth once daily with breakfast 90 tablet 0   methylPREDNISolone (MEDROL DOSEPAK) 4 MG TBPK tablet Take as directed 1 each 0   ondansetron (ZOFRAN) 8 MG tablet TAKE 1 TABLET BY MOUTH EVERY 8 HOURS AS NEEDED FOR NAUSEA OR VOMITING 30 tablet 0   pantoprazole (PROTONIX) 40 MG tablet Take 1 tablet (40 mg total) by mouth daily. 90 tablet 1   potassium chloride SA (KLOR-CON) 20 MEQ tablet Take 2 tablets (40 mEq total) by mouth daily. 80MG x3 days then back to 40 mg/day 33 tablet 6   torsemide (DEMADEX) 20 MG tablet Take 1 tablet (20 mg total) by mouth 2 (two) times daily. 55m BID x 3 days then back to 231mBID 33 tablet 6   VEMLIDY 25 MG TABS Take 1 tablet (25 mg total) by mouth daily. 30 tablet 5   Vitamin D, Ergocalciferol, (DRISDOL) 1.25 MG (50000 UNIT) CAPS capsule Take 1 capsule by mouth once a week 12 capsule 0   Current Facility-Administered Medications  Medication Dose Route Frequency Provider Last Rate Last Admin   0.9 %  sodium chloride infusion  500 mL Intravenous Once StLadene ArtistMD        REVIEW OF SYSTEMS:   Constitutional: ( - ) fevers, ( - )  chills , ( -  ) night sweats Eyes: ( - ) blurriness of vision, ( - ) double vision, ( - ) watery eyes Ears, nose, mouth, throat, and face: ( - ) mucositis, ( - ) sore throat Respiratory: ( - ) cough, ( +) dyspnea, ( - ) wheezes Cardiovascular: ( - ) palpitation, ( - ) chest discomfort, ( +) lower extremity swelling Gastrointestinal:  ( - ) nausea, ( - ) heartburn, ( + ) change in bowel habits Skin: ( - ) abnormal skin rashes Lymphatics: ( - ) new lymphadenopathy, ( - ) easy bruising Neurological: (+) numbness, ( - ) tingling, ( - ) new weaknesses Behavioral/Psych: ( - ) mood change, ( - ) new changes  All other systems were reviewed with the patient and are negative.  PHYSICAL EXAMINATION: ECOG PERFORMANCE STATUS: 1 - Symptomatic but completely ambulatory  Vitals:   07/13/21 0950  BP: (!) 134/105  Pulse: 100  Resp: 19  Temp: 97.7 F (36.5 C)  SpO2: 99%    Filed Weights   07/13/21 0950  Weight: 179 lb 9 oz (81.4 kg)     GENERAL: well appearing middle aged AfSerbiamerican male alert, no distress and comfortable SKIN: skin color, texture, turgor are normal, no rashes or significant lesions EYES: conjunctiva are pink and non-injected, sclera clear LUNGS: clear to auscultation and percussion with normal breathing effort HEART: regular rate & rhythm and no murmurs and +2 lower extremity edema bilaterally  Musculoskeletal: no cyanosis of digits and no clubbing  PSYCH: alert & oriented x 3, fluent speech NEURO: no focal motor/sensory deficits  LABORATORY DATA:  I have reviewed the data as listed CBC Latest Ref Rng & Units 07/13/2021 06/15/2021 05/18/2021  WBC 4.0 - 10.5 K/uL 4.7 4.5 6.4  Hemoglobin 13.0 - 17.0 g/dL  10.7(L) 10.2(L) 10.5(L)  Hematocrit 39.0 - 52.0 % 31.8(L) 30.0(L) 30.6(L)  Platelets 150 - 400 K/uL 217 225 218    CMP Latest Ref Rng & Units 07/13/2021 06/15/2021 05/18/2021  Glucose 70 - 99 mg/dL 92 90 148(H)  BUN 8 - 23 mg/dL _0 Creatinine 0.61 - 1.24 mg/dL 1.36(H) 1.39(H)  1.33(H)  Sodium 135 - 145 mmol/L 146(H) 144 144  Potassium 3.5 - 5.1 mmol/L 3.7 3.3(L) 3.7  Chloride 98 - 111 mmol/L 108 104 104  CO2 22 - 32 mmol/L _1 Calcium 8.9 - 10.3 mg/dL 8.6(L) 8.8(L) 8.6(L)  Total Protein 6.5 - 8.1 g/dL 4.9(L) 4.9(L) 5.3(L)  Total Bilirubin 0.3 - 1.2 mg/dL 0.4 0.5 0.5  Alkaline Phos 38 - 126 U/L 82 88 82  AST 15 - 41 U/L _2 ALT 0 - 44 U/L _3 Lab Results  Component Value Date   MPROTEIN Not Observed 06/15/2021   MPROTEIN Not Observed 05/18/2021   MPROTEIN Not Observed 04/20/2021   Lab Results  Component Value Date   KPAFRELGTCHN 16.0 06/15/2021   KPAFRELGTCHN 19.2 05/18/2021   KPAFRELGTCHN 17.5 04/20/2021   LAMBDASER 17.8 06/15/2021   LAMBDASER 20.1 05/18/2021   LAMBDASER 20.0 04/20/2021   KAPLAMBRATIO 1.66 (L) 06/19/2021   KAPLAMBRATIO 0.90 06/15/2021   KAPLAMBRATIO 1.57 (L) 05/21/2021     RADIOGRAPHIC STUDIES: No results found.   ASSESSMENT & PLAN Samuel Butler 60 y.o. male with medical history significant for AL amyloidosis of the kidney presents for a follow up visit.  After review the labs, the records, discussion with the patient the findings are most consistent with AL amyloidosis affecting the kidney.  As such I recommended we start treatment with Dara-CyBorD.  After reviewing the biopsy results his findings are most consistent with AL amyloidosis.  As such the treatment of choice would be to target his plasma cell population with a triplet or quadruplet therapy.  Therapy of choice in this case would consist of daratumumab, Velcade, cyclophosphamide, and dexamethasone.  Given the patient's good functional status we will start with full dose Dara-CyBorD.  I previously discussed the side effects of this chemotherapy with the patient including neuropathy, elevated blood pressure, drop in blood counts, hypersensitivity reaction, chest tightness, increased infection risk, and fatigue.  The patient and family voiced their  understanding of these findings and are agreeable to moving forward with quadruple therapy with the goal of being a bridge to bone marrow transplant.    The regimen of choice is daratumumab, bortezomib, cyclophosphamide and dexamethasone per the ANDROMEDA Study ( Blood. 2020 Jul 2;136(1):71-80). Treatment consists of: Cyclophosphamide 300 mg/m2 intravenously and bortezomib 1.3 mg/m2 subcutaneously were given on days 1, 8, 15, and 22 of each 28 day cycle for up to 6 cycles. Dexamethasone 40 mg (starting dose) was given orally or intravenously weekly for each cycle for up to 6 cycles. DARA [place] was administered in a single, premixed vial and given by manual [place] injection over the course of 3 to 5 minutes weekly in cycles 1 to 2, every 2 weeks in cycles 3 to 6, and every 4 weeks thereafter as monotherapy for a maximum of 2 years. We would consider this continued dosing regimen.  He underwent colonoscopy on 03/28/2021 which showed no gross abnormalities but Congo red show involvement of the arterioles.  Labs from today were reviewed without any intervention needed.  Patient will proceed with treatment as planned.  Patient will  return to the clinic in 4 weeks as scheduled.  #AL Amyloidosis Affecting the Kidney --bone marrow biopsy and kidney biopsy helped to confirm the diagnosis of AL amyloidosis. --currently being treated with  Dara-CyBorD (per the [facility] Trial), started Cycle 1 Day 1 on 09/29/2020.  --today is Cycle 11 Day 1 of treatment. Moving forward the patient will only require monthly injections.  --will monitoring monthly SPEP, UPEP, and SFLC while on treatment. --will have patient return for weekly treatment with q 4 week clinic visits  #Diarrhea with abdominal cramping, improved --Improved with Imodium and Bentyl --GI EGD/Colonoscopy performed on 03/28/2021.  No gross abnormalities noted, however Congo red stain shows involvement of the arterioles but no the mucosal surface. The involvement of  amyloid in causing his symptoms is uncertain.    #Weight Loss, stable --Diarrhea combined with diuresis is likely causing his weight loss --weight with modest increase to 179 today. --Under the care of dietician and drinking protein shakes --continue to monitor  #Supportive Care --chemotherapy education performed --zofran 2m q8H PRN and compazine 174mPO q6H for nausea --acyclovir 40071mO BID for VCZ prophylaxis --Imodium and Bentyl for diarrhea -- EMLA cream for port not required, patient declines port at this time, but will reconsider if patient desires.  -- no pain medication required at this time.   No orders of the defined types were placed in this encounter.  All questions were answered. The patient knows to call the clinic with any problems, questions or concerns.  A total of more than 30 minutes were spent on this encounter and over half of that time was spent on counseling and coordination of care as outlined above.   JohLedell PeoplesD Department of Hematology/Oncology [facility] LLCone: [phone]: [phone]: [email or web address]   07/13/2021 10:24 AM   Literature Support:   V, Wendie Chess, Weiss BM, VerRush Landmarkerlini G, Comenzo RL. Daratumumab plus CyBorD for patients with newly diagnosed AL amyloidosis: safety run-in results of ANDROMEDA. Blood. 2020 Jul 2;136(1):71-80.   --Daratumumab-CyBorD was well tolerated, with no new safety concerns versus the intravenous formulation, and demonstrated robust hematologic and organ responses.

## 2021-07-13 NOTE — Patient Instructions (Signed)
[facility]  Discharge Instructions: °Thank you for choosing [facility] to provide your oncology and hematology care.  ° °If you have a lab appointment with the Cancer Center, please go directly to the Cancer Center and check in at the registration area. °  °Wear comfortable clothing and clothing appropriate for easy access to any Portacath or PICC line.  ° °We strive to give you quality time with your provider. You may need to reschedule your appointment if you arrive late (15 or more minutes).  Arriving late affects you and other patients whose appointments are after yours.  Also, if you miss three or more appointments without notifying the office, you may be dismissed from the clinic at the provider’s discretion.    °  °For prescription refill requests, have your pharmacy contact our office and allow 72 hours for refills to be completed.   ° °Today you received the following chemotherapy and/or immunotherapy agents: Darzalex  °  °To help prevent nausea and vomiting after your treatment, we encourage you to take your nausea medication as directed. ° °BELOW ARE SYMPTOMS THAT SHOULD BE REPORTED IMMEDIATELY: °*FEVER GREATER THAN 100.4 F (38 °C) OR HIGHER °*CHILLS OR SWEATING °*NAUSEA AND VOMITING THAT IS NOT CONTROLLED WITH YOUR NAUSEA MEDICATION °*UNUSUAL SHORTNESS OF BREATH °*UNUSUAL BRUISING OR BLEEDING °*URINARY PROBLEMS (pain or burning when urinating, or frequent urination) °*BOWEL PROBLEMS (unusual diarrhea, constipation, pain near the anus) °TENDERNESS IN MOUTH AND THROAT WITH OR WITHOUT PRESENCE OF ULCERS (sore throat, sores in mouth, or a toothache) °UNUSUAL RASH, SWELLING OR PAIN  °UNUSUAL VAGINAL DISCHARGE OR ITCHING  ° °Items with * indicate a potential emergency and should be followed up as soon as possible or go to the Emergency Department if any problems should occur. ° °Please show the CHEMOTHERAPY ALERT CARD or IMMUNOTHERAPY ALERT CARD at check-in to the  Emergency Department and triage nurse. ° °Should you have questions after your visit or need to cancel or reschedule your appointment, please contact [facility]  Dept: [phone]  and follow the prompts.  Office hours are 8:00 a.m. to 4:30 p.m. Monday - Friday. Please note that voicemails left after 4:00 p.m. may not be returned until the following business day.  We are closed weekends and major holidays. You have access to a nurse at all times for urgent questions. Please call the main number to the clinic Dept: [phone] and follow the prompts. ° ° °For any non-urgent questions, you may also contact your provider using MyChart. We now offer e-Visits for anyone 18 and older to request care online for non-urgent symptoms. For details visit [email or web address]. °  °Also download the MyChart app! Go to the app store, search "MyChart", open the app, select , and log in with your MyChart username and password. ° °Due to Covid, a mask is required upon entering the hospital/clinic. If you do not have a mask, one will be given to you upon arrival. For doctor visits, patients may have 1 support person aged 18 or older with them. For treatment visits, patients cannot have anyone with them due to current Covid guidelines and our immunocompromised population.  ° °

## 2021-07-13 NOTE — Progress Notes (Signed)
[place] CSW Progress Notes  Met w patient in infusion, provided first disbursement from [employer] for [employer]. He can request up to 3 more of these.  He is driving himself to/from appointments, is also able to access [facility] Transportation if needed.  He had a recent visit w physician re his foot pain - feels there may be a solution to the pain that would allow him to be more active/mobile.  Enjoys outdoor activities such as mowing grass, would like to be able to resume this activity without pain.  Wife is working on completing needed paperwork for referral to health psychologist, Dr Michail Sermon.  No appointment will be scheduled until [place] receives this paperwork.  Edwyna Shell, LCSW Clinical Social Worker Phone:  [phone]

## 2021-07-13 NOTE — Progress Notes (Signed)
BP 135/106.  MD aware.  Ok to proceed with chemotherapy.

## 2021-07-13 NOTE — Progress Notes (Signed)
Nutrition Follow-up:  Patient with AL amyloidosis of kidney. He is receiving Dara-CyBorD.  Met with patient in infusion. He reports diarrhea has improved some, he continues to have multiple loose stools daily but they are more formed and not having as many. Patient is taking Imodium and Bentyl, drinking Enterade daily. Patient reports eating more, he endorses bland taste with some foods. He is drinking 2 Boost Plus daily, says he tolerates well. Yesterday he had Boost for breakfast, peanut butter jelly, peaches, applesauce, and dumplings but they did not taste like anything so he only had a few bites.   Medications: reviewed  Labs: Na 146, Cr 1.36  Anthropometrics: Weight 179 lb 9 oz today increased from 176 lb on 8/5 decreased from 181 lb 9.6 oz on 7/19 and 194 lb on 7/6  NUTRITION DIAGNOSIS: Unintentional weight loss stable   INTERVENTION:  Patient will work to increase calories and protein to promote weight gain Encouraged small frequent meals and snacks with adequate calories and protein Discussed strategies for altered taste, handout with tips provided Continue drinking 2 Boost Plus daily, coupons provided Continue drinking Enterade daily Continue Imodium and Bentyl per MD Patient has contact information    MONITORING, EVALUATION, GOAL: weight trends, intake   NEXT VISIT: f/u via telephone ~4 weeks

## 2021-07-17 ENCOUNTER — Telehealth: Payer: Self-pay | Admitting: Hematology and Oncology

## 2021-07-17 LAB — KAPPA/LAMBDA LIGHT CHAINS
Kappa free light chain: 16.9 mg/L (ref 3.3–19.4)
Kappa, lambda light chain ratio: 0.73 (ref 0.26–1.65)
Lambda free light chains: 23.1 mg/L (ref 5.7–26.3)

## 2021-07-17 NOTE — Telephone Encounter (Signed)
Scheduled appointment per 09/02 los. Patient is aware. 

## 2021-07-18 ENCOUNTER — Other Ambulatory Visit: Payer: Self-pay | Admitting: Cardiology

## 2021-07-18 LAB — MULTIPLE MYELOMA PANEL, SERUM
Albumin SerPl Elph-Mcnc: 2.1 g/dL — ABNORMAL LOW (ref 2.9–4.4)
Albumin/Glob SerPl: 1 (ref 0.7–1.7)
Alpha 1: 0.2 g/dL (ref 0.0–0.4)
Alpha2 Glob SerPl Elph-Mcnc: 1.1 g/dL — ABNORMAL HIGH (ref 0.4–1.0)
B-Globulin SerPl Elph-Mcnc: 0.7 g/dL (ref 0.7–1.3)
Gamma Glob SerPl Elph-Mcnc: 0.2 g/dL — ABNORMAL LOW (ref 0.4–1.8)
Globulin, Total: 2.2 g/dL (ref 2.2–3.9)
IgA: 42 mg/dL — ABNORMAL LOW (ref 61–437)
IgG (Immunoglobin G), Serum: 197 mg/dL — ABNORMAL LOW (ref 603–1613)
IgM (Immunoglobulin M), Srm: 21 mg/dL (ref 20–172)
Total Protein ELP: 4.3 g/dL — ABNORMAL LOW (ref 6.0–8.5)

## 2021-07-18 NOTE — Telephone Encounter (Signed)
This is Dr. Berry's pt 

## 2021-07-23 ENCOUNTER — Other Ambulatory Visit: Payer: Self-pay | Admitting: Cardiovascular Disease

## 2021-07-23 ENCOUNTER — Encounter: Payer: Self-pay | Admitting: Internal Medicine

## 2021-07-23 NOTE — Telephone Encounter (Signed)
Called patient back about his message on My Chart. Patient stated he has been having SOB when he tries to sleep and lay down. Patient stated he was told by his nephrologist to take his torsemide 20 mg daily instead of BID. Asked patient about daily weights. Patient stated he has been losing weight. Patient has gone from 240 lbs to 117 lbs since starting chemo in the last year. Patient has been taking chemo for over a year. Patient also stated he has been having blood in his stool that has been mostly diarrhea. Patient stated he also has soreness and pain in the tips of his breast, and states it feels like the tips are filled with something. Patient stated he feels like he should go to the ED. Informed patient that he should go to the ED, if he is feeling that bad. Encouraged patient to contact his PCP and his oncologist about his symptoms as well. Patient stated he had already sent My Chart message to his PCP. Will send message to Dr. Debara Pickett for further advisement.

## 2021-07-23 NOTE — Telephone Encounter (Signed)
Thanks ... if he feels that unwell, the ER is reasonable to be seen sooner than later.  Dr Debara Pickett

## 2021-07-26 ENCOUNTER — Ambulatory Visit (INDEPENDENT_AMBULATORY_CARE_PROVIDER_SITE_OTHER): Payer: BC Managed Care – PPO | Admitting: Psychologist

## 2021-07-26 DIAGNOSIS — F320 Major depressive disorder, single episode, mild: Secondary | ICD-10-CM | POA: Diagnosis not present

## 2021-07-29 ENCOUNTER — Other Ambulatory Visit: Payer: Self-pay | Admitting: Hematology and Oncology

## 2021-07-30 ENCOUNTER — Other Ambulatory Visit: Payer: Self-pay

## 2021-07-30 ENCOUNTER — Encounter: Payer: Self-pay | Admitting: Hematology and Oncology

## 2021-07-30 MED ORDER — CILOSTAZOL 50 MG PO TABS: 50.0000 mg | ORAL_TABLET | Freq: Two times a day (BID) | ORAL | 3 refills | Status: AC

## 2021-08-02 ENCOUNTER — Other Ambulatory Visit: Payer: Self-pay | Admitting: Hematology and Oncology

## 2021-08-03 ENCOUNTER — Emergency Department (HOSPITAL_BASED_OUTPATIENT_CLINIC_OR_DEPARTMENT_OTHER)
Admission: EM | Admit: 2021-08-03 | Discharge: 2021-08-03 | Disposition: A | Discharge status: Home / Self Care | Payer: BC Managed Care – PPO | Attending: Emergency Medicine | Admitting: Emergency Medicine

## 2021-08-03 ENCOUNTER — Emergency Department (HOSPITAL_BASED_OUTPATIENT_CLINIC_OR_DEPARTMENT_OTHER): Payer: BC Managed Care – PPO

## 2021-08-03 ENCOUNTER — Encounter: Payer: Self-pay | Admitting: Physician Assistant

## 2021-08-03 ENCOUNTER — Encounter: Payer: Self-pay | Admitting: Pharmacist

## 2021-08-03 ENCOUNTER — Encounter (HOSPITAL_BASED_OUTPATIENT_CLINIC_OR_DEPARTMENT_OTHER): Payer: Self-pay | Admitting: *Deleted

## 2021-08-03 ENCOUNTER — Encounter: Payer: Self-pay | Admitting: Internal Medicine

## 2021-08-03 ENCOUNTER — Encounter: Payer: Self-pay | Admitting: Hematology and Oncology

## 2021-08-03 ENCOUNTER — Other Ambulatory Visit: Payer: Self-pay

## 2021-08-03 DIAGNOSIS — Z79899 Other long term (current) drug therapy: Secondary | ICD-10-CM | POA: Diagnosis not present

## 2021-08-03 DIAGNOSIS — Z7901 Long term (current) use of anticoagulants: Secondary | ICD-10-CM | POA: Diagnosis not present

## 2021-08-03 DIAGNOSIS — K625 Hemorrhage of anus and rectum: Secondary | ICD-10-CM | POA: Diagnosis not present

## 2021-08-03 DIAGNOSIS — Z859 Personal history of malignant neoplasm, unspecified: Secondary | ICD-10-CM | POA: Insufficient documentation

## 2021-08-03 DIAGNOSIS — I10 Essential (primary) hypertension: Secondary | ICD-10-CM | POA: Diagnosis not present

## 2021-08-03 DIAGNOSIS — R634 Abnormal weight loss: Secondary | ICD-10-CM | POA: Diagnosis not present

## 2021-08-03 DIAGNOSIS — Z87891 Personal history of nicotine dependence: Secondary | ICD-10-CM | POA: Diagnosis not present

## 2021-08-03 DIAGNOSIS — Z7902 Long term (current) use of antithrombotics/antiplatelets: Secondary | ICD-10-CM | POA: Insufficient documentation

## 2021-08-03 DIAGNOSIS — R0602 Shortness of breath: Secondary | ICD-10-CM | POA: Diagnosis present

## 2021-08-03 DIAGNOSIS — E119 Type 2 diabetes mellitus without complications: Secondary | ICD-10-CM | POA: Diagnosis not present

## 2021-08-03 DIAGNOSIS — R197 Diarrhea, unspecified: Secondary | ICD-10-CM | POA: Diagnosis not present

## 2021-08-03 DIAGNOSIS — Z7984 Long term (current) use of oral hypoglycemic drugs: Secondary | ICD-10-CM | POA: Diagnosis not present

## 2021-08-03 DIAGNOSIS — K922 Gastrointestinal hemorrhage, unspecified: Secondary | ICD-10-CM

## 2021-08-03 DIAGNOSIS — I2510 Atherosclerotic heart disease of native coronary artery without angina pectoris: Secondary | ICD-10-CM | POA: Insufficient documentation

## 2021-08-03 LAB — COMPREHENSIVE METABOLIC PANEL
ALT: 29 U/L (ref 0–44)
AST: 27 U/L (ref 15–41)
Albumin: 2.9 g/dL — ABNORMAL LOW (ref 3.5–5.0)
Alkaline Phosphatase: 65 U/L (ref 38–126)
Anion gap: 11 (ref 5–15)
BUN: 19 mg/dL (ref 8–23)
CO2: 27 mmol/L (ref 22–32)
Calcium: 8.4 mg/dL — ABNORMAL LOW (ref 8.9–10.3)
Chloride: 108 mmol/L (ref 98–111)
Creatinine, Ser: 1.42 mg/dL — ABNORMAL HIGH (ref 0.61–1.24)
GFR, Estimated: 56 mL/min — ABNORMAL LOW (ref >60)
Glucose, Bld: 89 mg/dL (ref 70–99)
Potassium: 3.3 mmol/L — ABNORMAL LOW (ref 3.5–5.1)
Sodium: 146 mmol/L — ABNORMAL HIGH (ref 135–145)
Total Bilirubin: 0.4 mg/dL (ref 0.3–1.2)
Total Protein: 5 g/dL — ABNORMAL LOW (ref 6.5–8.1)

## 2021-08-03 LAB — CBC WITH DIFFERENTIAL/PLATELET
Abs Immature Granulocytes: 0.01 10*3/uL (ref 0.00–0.07)
Basophils Absolute: 0 10*3/uL (ref 0.0–0.1)
Basophils Relative: 0 %
Eosinophils Absolute: 0.2 10*3/uL (ref 0.0–0.5)
Eosinophils Relative: 6 %
HCT: 34.7 % — ABNORMAL LOW (ref 39.0–52.0)
Hemoglobin: 11.3 g/dL — ABNORMAL LOW (ref 13.0–17.0)
Immature Granulocytes: 0 %
Lymphocytes Relative: 33 %
Lymphs Abs: 1.4 10*3/uL (ref 0.7–4.0)
MCH: 31.1 pg (ref 26.0–34.0)
MCHC: 32.6 g/dL (ref 30.0–36.0)
MCV: 95.6 fL (ref 80.0–100.0)
Monocytes Absolute: 0.4 10*3/uL (ref 0.1–1.0)
Monocytes Relative: 10 %
Neutro Abs: 2.2 10*3/uL (ref 1.7–7.7)
Neutrophils Relative %: 51 %
Platelets: 181 10*3/uL (ref 150–400)
RBC: 3.63 MIL/uL — ABNORMAL LOW (ref 4.22–5.81)
RDW: 14.5 % (ref 11.5–15.5)
WBC: 4.2 10*3/uL (ref 4.0–10.5)
nRBC: 0 % (ref 0.0–0.2)

## 2021-08-03 LAB — OCCULT BLOOD X 1 CARD TO LAB, STOOL: Fecal Occult Bld: POSITIVE — AB

## 2021-08-03 LAB — PROTIME-INR
INR: 1.2 (ref 0.8–1.2)
Prothrombin Time: 15.2 seconds (ref 11.4–15.2)

## 2021-08-03 MED ORDER — IOHEXOL 350 MG/ML SOLN
75.0000 mL | Freq: Once | INTRAVENOUS | Status: AC | PRN
  Administered 2021-08-03: 75 mL via INTRAVENOUS

## 2021-08-03 NOTE — ED Provider Notes (Signed)
[facility] Provider Note   CSN: [id number] Arrival date & time: 08/03/21  1741     History Chief Complaint  Patient presents with   Rectal Bleeding   Weight Loss    Samuel Butler is a 60 y.o. male with a history of AL amyloidosis of the kidney (currently receiving chemotherapy Dara-CyBorD, last received 9/2), diabetes mellitus, hyperlipidemia, hypertension, inflammatory bowel disease, pulmonary embolism.  Patient presents to the emergency department with a chief complaint of diarrhea, rectal bleeding, and shortness of breath.  Patient reports that he has been having rectal bleeding over the last 2 months.  Patient denies any change in rectal bleeding over this time.  Patient reports that bleeding is only present when he has bowel movements.  Patient reports that blood is feeling any commode.  Patient describes blood as dark red in color.  Patient denies any pain with defecation, melena, abdominal pain, nausea, vomiting, dysuria, hematuria.  Patient reports that he has been having diarrhea since starting chemotherapy treatment.  Patient reports diarrhea has been getting worse.  Previously was controlled with Bentyl and Imodium.  Patient reports that he is having loose stool approximately 5 minutes after any oral intake.  Patient endorses shortness of breath.  States that shortness of breath has been present over the last year since he was diagnosed with amyloidosis.  Patient reports that he feels the shortness of breath has gotten worse recently.  Patient reports that he has had shortness of breath when laying flat or sitting in chair.   Additionally patient endorses weight loss over the last 4 months.   Rectal Bleeding Associated symptoms: no abdominal pain, no dizziness, no fever, no light-headedness and no vomiting       Past Medical History:  Diagnosis Date   Cancer ([place])    DDD (degenerative disc disease), lumbar    per his report   Diabetes  mellitus without complication ([place])    Gout 04/16/2013   Hyperlipidemia    Hypertension    IBD (inflammatory bowel disease) 04/05/2016   Pulmonary embolism ([place])    Tobacco use disorder 03/28/2015    Patient Active Problem List   Diagnosis Date Noted   Pain due to onychomycosis of toenails of both feet 05/16/2021   Coronary artery disease 03/13/2021   Acute pulmonary embolism ([place]) 10/18/2020   Groin pain, right 08/31/2020   Chronic viral hepatitis B without delta-agent ([place]) 08/21/2020   Amyloidosis ([place]) 08/21/2020   Anasarca 08/04/2020   Nephrotic syndrome 08/03/2020   AKI (acute kidney injury) ([place]) 08/03/2020   Hypokalemia 06/27/2020   Claudication in peripheral vascular disease ([place]) 06/26/2020   Hypertension associated with diabetes ([place]) 05/25/2020   Peripheral arterial disease ([place]) 03/08/2020   Vitamin D deficiency 10/26/2019   Hypoalbuminemia 10/26/2019   IBD (inflammatory bowel disease) 04/05/2016   Tobacco use disorder 03/28/2015   Non-insulin treated type 2 diabetes mellitus ([place]) 01/25/2014   Essential hypertension, benign 04/16/2013   Hyperlipemia 04/16/2013   Gout 04/16/2013    Past Surgical History:  Procedure Laterality Date   ABDOMINAL AORTOGRAM W/LOWER EXTREMITY Right 06/26/2020   Procedure: ABDOMINAL AORTOGRAM W/LOWER EXTREMITY;  Surgeon: Lorretta Harp, MD;  Location: [facility];  Service: Cardiovascular;  Laterality: Right;   COLONOSCOPY     PERIPHERAL VASCULAR INTERVENTION  06/26/2020   Procedure: PERIPHERAL VASCULAR INTERVENTION;  Surgeon: Lorretta Harp, MD;  Location: [facility];  Service: Cardiovascular;;  Right SFA   TONSILLECTOMY  Family History  Problem Relation Age of Onset   Hypertension Father    Diabetes Father    Stroke Father    Pancreatic cancer Father    Colon cancer Brother    Esophageal cancer Neg Hx    Stomach cancer Neg Hx     Social History   Tobacco Use   Smoking status: Former     Types: Cigarettes    Quit date: 10/23/2016    Years since quitting: 4.7   Smokeless tobacco: Never   Tobacco comments:    per patient 5 cigarettes a day   Vaping Use   Vaping Use: Never used  Substance Use Topics   Alcohol use: Not Currently    Comment: occasional   Drug use: Not Currently    Home Medications Prior to Admission medications   Medication Sig Start Date End Date Taking? Authorizing Provider  acyclovir (ZOVIRAX) 400 MG tablet Take 1 tablet by mouth twice daily 07/30/21  Yes Orson Slick, MD  atorvastatin (LIPITOR) 80 MG tablet Take 1 tablet by mouth once daily 06/13/21  Yes Isaac Bliss, Rayford Halsted, MD  cilostazol (PLETAL) 50 MG tablet Take 1 tablet (50 mg total) by mouth 2 (two) times daily. 07/30/21  Yes Lorretta Harp, MD  clopidogrel (PLAVIX) 75 MG tablet Take 1 tablet by mouth once daily with breakfast 07/18/21  Yes Lorretta Harp, MD  dicyclomine (BENTYL) 20 MG tablet TAKE 1 TABLET BY MOUTH 4 TIMES DAILY 20  TO  30  MINUTES  BEFORE  MEAL(S)  AND  AT  BEDTIME 06/25/21  Yes Ellouise Newer [place], [place]  ELIQUIS 5 MG TABS tablet Take 1 tablet by mouth twice daily 05/31/21  Yes Isaac Bliss, Rayford Halsted, MD  ezetimibe (ZETIA) 10 MG tablet Take 1 tablet (10 mg total) by mouth daily. 11/16/20 05/21/22 Yes Cleaver, [name], NP  HYDROcodone-acetaminophen (NORCO) 7.5-325 MG tablet hydrocodone 7.5 mg-acetaminophen 325 mg tablet  TAKE 1 TABLET BY MOUTH EVERY 6 HOURS AS NEEDED FOR MODERATE PAIN FOR UP TO 5 DAYS   Yes [provider]  isosorbide mononitrate (IMDUR) 30 MG 24 hr tablet Take 1 tablet by mouth once daily 01/02/21  Yes Isaac Bliss, Rayford Halsted, MD  loperamide (IMODIUM) 1 MG/5ML solution Take 2 mg by mouth as needed for diarrhea or loose stools.   Yes [provider]  losartan (COZAAR) 50 MG tablet Take 1 tablet (50 mg total) by mouth daily. 07/25/20  Yes Lorretta Harp, MD  metFORMIN (GLUCOPHAGE) 500 MG tablet Take 1 tablet by mouth once daily with  breakfast 03/12/21  Yes Isaac Bliss, Rayford Halsted, MD  pantoprazole (PROTONIX) 40 MG tablet Take 1 tablet (40 mg total) by mouth daily. 01/18/21  Yes Isaac Bliss, Rayford Halsted, MD  potassium chloride SA (KLOR-CON) 20 MEQ tablet Take 1 tablet by mouth twice daily 08/03/21  Yes Orson Slick, MD  torsemide (DEMADEX) 20 MG tablet Take 1 tablet (20 mg total) by mouth 2 (two) times daily. 40mg  BID x 3 days then back to 20mg  BID 11/29/20  Yes Cleaver, [name], NP  VEMLIDY 25 MG TABS Take 1 tablet (25 mg total) by mouth daily. 05/29/21  Yes Mignon Pine, DO  acetaminophen (TYLENOL) 650 MG CR tablet Take 1,300 mg by mouth every 8 (eight) hours as needed for pain.     [provider]  albuterol (VENTOLIN HFA) 108 (90 Base) MCG/ACT inhaler Inhale 2 puffs into the lungs every 6 (six) hours  as needed for wheezing or shortness of breath. 09/28/20   Orson Slick, MD  Evolocumab (REPATHA SURECLICK) 878 MG/ML SOAJ Inject 140 mg into the skin every 14 (fourteen) days. 12/28/20   Lorretta Harp, MD  ondansetron (ZOFRAN) 8 MG tablet TAKE 1 TABLET BY MOUTH EVERY 8 HOURS AS NEEDED FOR NAUSEA OR VOMITING 10/27/20   Orson Slick, MD  Vitamin D, Ergocalciferol, (DRISDOL) 1.25 MG (50000 UNIT) CAPS capsule Take 1 capsule by mouth once a week 12/05/20   Isaac Bliss, Rayford Halsted, MD    Allergies    Crestor [rosuvastatin calcium]  Review of Systems   Review of Systems  Constitutional:  Positive for chills. Negative for fever.  HENT:  Negative for congestion, rhinorrhea and sore throat.   Eyes:  Negative for visual disturbance.  Respiratory:  Positive for shortness of breath. Negative for cough.   Cardiovascular:  Negative for chest pain, palpitations and leg swelling.  Gastrointestinal:  Positive for blood in stool, diarrhea and hematochezia. Negative for abdominal distention, abdominal pain, anal bleeding, nausea and vomiting.  Genitourinary:  Negative for difficulty urinating, dysuria and  hematuria.  Musculoskeletal:  Negative for back pain and neck pain.  Skin:  Negative for color change and rash.  Neurological:  Negative for dizziness, syncope, light-headedness and headaches.  Psychiatric/Behavioral:  Negative for confusion.    Physical Exam Updated Vital Signs BP (!) 153/111   Pulse 88   Temp 98.5 F (36.9 C)   Resp 19   Ht 5\' 11"  (1.803 m)   Wt 79.5 kg   SpO2 97%   BMI 24.46 kg/m   Physical Exam Vitals and nursing note reviewed. Exam conducted with a chaperone present (Male nurse tech present as chaperone).  Constitutional:      General: He is not in acute distress.    Appearance: He is not ill-appearing, toxic-appearing or diaphoretic.  HENT:     Head: Normocephalic.  Eyes:     General: No scleral icterus.       Right eye: No discharge.        Left eye: No discharge.  Cardiovascular:     Rate and Rhythm: Normal rate.  Pulmonary:     Effort: Pulmonary effort is normal. No tachypnea, bradypnea or respiratory distress.     Breath sounds: Normal breath sounds. No stridor.  Abdominal:     General: Abdomen is flat. Bowel sounds are normal. There are no signs of injury.     Palpations: Abdomen is soft. There is no mass or pulsatile mass.     Tenderness: There is no abdominal tenderness. There is no guarding or rebound.     Hernia: There is no hernia in the umbilical area or ventral area.  Genitourinary:    Rectum: Guaiac result positive. No mass, tenderness, anal fissure, external hemorrhoid or internal hemorrhoid. Normal anal tone.     Comments: No stool was noted in patient's rectal vault.  No hemorrhoids or anal fissures. Musculoskeletal:     Right lower leg: No deformity, lacerations, tenderness or bony tenderness.     Left lower leg: No deformity, lacerations, tenderness or bony tenderness.     Comments: Trace edema noted to bilateral lower extremities  Skin:    General: Skin is warm and dry.  Neurological:     General: No focal deficit present.      Mental Status: He is alert.  Psychiatric:        Behavior: Behavior is cooperative.  ED Results / Procedures / Treatments   Labs (all labs ordered are listed, but only abnormal results are displayed) Labs Reviewed  CBC WITH DIFFERENTIAL/PLATELET - Abnormal; Notable for the following components:      Result Value   RBC 3.63 (*)    Hemoglobin 11.3 (*)    HCT 34.7 (*)    All other components within normal limits  OCCULT BLOOD X 1 CARD TO LAB, STOOL - Abnormal; Notable for the following components:   Fecal Occult Bld POSITIVE (*)    All other components within normal limits  COMPREHENSIVE METABOLIC PANEL  PROTIME-INR    EKG EKG Interpretation  Date/Time:  Friday August 03 2021 21:07:04 EDT Ventricular Rate:  83 PR Interval:  179 QRS Duration: 113 QT Interval:  440 QTC Calculation: 518 R Axis:   -68 Text Interpretation: Sinus rhythm LAD, consider left anterior fascicular block Anterior infarct, old Nonspecific T abnormalities, lateral leads Prolonged QT interval Confirmed by Thamas Jaegers ([id number]) on 08/03/2021 10:01:14 PM  Radiology DG Chest Portable 1 View  Result Date: 08/03/2021 CLINICAL DATA:  Shortness of breath EXAM: PORTABLE CHEST 1 VIEW COMPARISON:  08/03/2020 FINDINGS: Heart size is mildly enlarged. No focal airspace consolidation, pleural effusion, or pneumothorax. IMPRESSION: Mild cardiomegaly.  Lungs are clear. Electronically Signed   By: Davina Poke D.O.   On: 08/03/2021 21:47    Procedures Procedures   Medications Ordered in ED Medications - No data to display  ED Course  I have reviewed the triage vital signs and the nursing notes.  Pertinent labs & imaging results that were available during my care of the patient were reviewed by me and considered in my medical decision making (see chart for details).    MDM Rules/Calculators/A&P                           Alert 60 year old male no acute distress, nontoxic-appearing.  Presents to the  emergency department with a chief complaint of diarrhea, blood in stool, and shortness of breath.  Patient is actively receiving chemotherapy for amyloidosis.  Last chemotherapy was 9/2.  Diarrhea has been present since patient has started chemotherapy.  Reports that it has gotten worse recently.  Previously controlled by Imodium and Bentyl.  No abdominal pain, nausea, vomiting.  Patient complains of rectal bleeding x2 months.  Rectal bleeding has been unchanged.  Patient reports that he is on Plavix.  Patient reports shortness of breath x1 year.  He reports that shortness of breath has gotten worse recently.  Will obtain chest x-ray, EKG, CMP, CBC, Hemoccult.  GU exam shows no anal fissures or hemorrhoids.  No stool noted in rectal vault.  Hemoccult positive  Chest x-ray shows no acute abnormality CBC shows anemia, patient is at baseline. CMP shows patient at baseline values.  Patient has history of PE and active cancer will obtain CTA to evaluate for possible PE.  If imaging is unremarkable plan to discharge.  Patient will need to follow-up with oncology and gastroenterology.  Patient care transferred to [place] at the end of my shift. Patient presentation, ED course, and plan of care discussed with review of all pertinent labs and imaging. Please see his/her note for further details regarding further ED course and disposition.   Final Clinical Impression(s) / ED Diagnoses Final diagnoses:  None    Rx / DC Orders ED Discharge Orders     None        Loni Beckwith, PA-C  08/03/21 2216    Luna Fuse, MD 08/15/21 [phone]

## 2021-08-03 NOTE — ED Provider Notes (Signed)
Angio is unremarkable for pulmonary embolism.  Cardiac disease noted.  Recommending outpatient follow-up with his doctor and cardiologist within the week.  Recommending immediate return for worsening pain fevers or any additional concerns.  He will follow-up with his gastroenterologist as well as he is hemodynamically stable and hemoglobin level appears stable as well.    Luna Fuse, MD 08/03/21 2249

## 2021-08-03 NOTE — ED Notes (Signed)
EDP at Bedside 

## 2021-08-03 NOTE — Discharge Instructions (Addendum)
Your CT scan did not show any signs of a blood clot.  You will need to follow-up with your cardiologist this week.  As well as following-up with your GI doctor Monday.  Return to the ER immediately if you have worsening bleeding pain fevers or any additional concerns.

## 2021-08-03 NOTE — ED Triage Notes (Signed)
Rectal bleeding and weight loss for a month.  Patient is a cancer patient.

## 2021-08-03 NOTE — ED Notes (Signed)
This RN presented the AVS utilizing Teachback Method. Patient verbalizes understanding of Discharge Instructions. Opportunity for Questioning and Answers were provided. Patient Discharged from ED ambulatory to Home with Significant Other.

## 2021-08-03 NOTE — Telephone Encounter (Signed)
FYI  Spoke with patient and he states that his is continuing to lose weight.  About 15 minutes after he eats he has diarrhea with blood in the stool.    Advised patient to go to the ED.  Patient will call his wife as he is unable to drive.  Awaiting patient's arrival at the ED.

## 2021-08-09 ENCOUNTER — Telehealth: Payer: Self-pay | Admitting: *Deleted

## 2021-08-09 ENCOUNTER — Emergency Department (HOSPITAL_COMMUNITY): Payer: BC Managed Care – PPO

## 2021-08-09 ENCOUNTER — Encounter (HOSPITAL_COMMUNITY): Payer: Self-pay | Admitting: Emergency Medicine

## 2021-08-09 ENCOUNTER — Encounter: Payer: Self-pay | Admitting: Internal Medicine

## 2021-08-09 ENCOUNTER — Encounter: Payer: Self-pay | Admitting: Hematology and Oncology

## 2021-08-09 ENCOUNTER — Ambulatory Visit: Payer: BC Managed Care – PPO | Admitting: Psychologist

## 2021-08-09 ENCOUNTER — Emergency Department (HOSPITAL_COMMUNITY)
Admission: EM | Admit: 2021-08-09 | Discharge: 2021-08-09 | Disposition: A | Discharge status: Home / Self Care | Payer: BC Managed Care – PPO | Attending: Emergency Medicine | Admitting: Emergency Medicine

## 2021-08-09 DIAGNOSIS — Z79899 Other long term (current) drug therapy: Secondary | ICD-10-CM | POA: Diagnosis not present

## 2021-08-09 DIAGNOSIS — Z859 Personal history of malignant neoplasm, unspecified: Secondary | ICD-10-CM | POA: Diagnosis not present

## 2021-08-09 DIAGNOSIS — I2510 Atherosclerotic heart disease of native coronary artery without angina pectoris: Secondary | ICD-10-CM | POA: Insufficient documentation

## 2021-08-09 DIAGNOSIS — Z7902 Long term (current) use of antithrombotics/antiplatelets: Secondary | ICD-10-CM | POA: Diagnosis not present

## 2021-08-09 DIAGNOSIS — I10 Essential (primary) hypertension: Secondary | ICD-10-CM | POA: Insufficient documentation

## 2021-08-09 DIAGNOSIS — E119 Type 2 diabetes mellitus without complications: Secondary | ICD-10-CM | POA: Diagnosis not present

## 2021-08-09 DIAGNOSIS — R000 Tachycardia, unspecified: Secondary | ICD-10-CM | POA: Insufficient documentation

## 2021-08-09 DIAGNOSIS — R059 Cough, unspecified: Secondary | ICD-10-CM | POA: Diagnosis present

## 2021-08-09 DIAGNOSIS — Z7984 Long term (current) use of oral hypoglycemic drugs: Secondary | ICD-10-CM | POA: Diagnosis not present

## 2021-08-09 DIAGNOSIS — U071 COVID-19: Secondary | ICD-10-CM | POA: Insufficient documentation

## 2021-08-09 DIAGNOSIS — Z87891 Personal history of nicotine dependence: Secondary | ICD-10-CM | POA: Insufficient documentation

## 2021-08-09 DIAGNOSIS — Z7901 Long term (current) use of anticoagulants: Secondary | ICD-10-CM | POA: Insufficient documentation

## 2021-08-09 LAB — COMPREHENSIVE METABOLIC PANEL
ALT: 31 U/L (ref 0–44)
AST: 39 U/L (ref 15–41)
Albumin: 2.1 g/dL — ABNORMAL LOW (ref 3.5–5.0)
Alkaline Phosphatase: 71 U/L (ref 38–126)
Anion gap: 9 (ref 5–15)
BUN: 13 mg/dL (ref 8–23)
CO2: 27 mmol/L (ref 22–32)
Calcium: 8.3 mg/dL — ABNORMAL LOW (ref 8.9–10.3)
Chloride: 106 mmol/L (ref 98–111)
Creatinine, Ser: 1.57 mg/dL — ABNORMAL HIGH (ref 0.61–1.24)
GFR, Estimated: 50 mL/min — ABNORMAL LOW (ref >60)
Glucose, Bld: 101 mg/dL — ABNORMAL HIGH (ref 70–99)
Potassium: 3.6 mmol/L (ref 3.5–5.1)
Sodium: 142 mmol/L (ref 135–145)
Total Bilirubin: 0.6 mg/dL (ref 0.3–1.2)
Total Protein: 4.5 g/dL — ABNORMAL LOW (ref 6.5–8.1)

## 2021-08-09 LAB — CBC WITH DIFFERENTIAL/PLATELET
Abs Immature Granulocytes: 0.01 10*3/uL (ref 0.00–0.07)
Basophils Absolute: 0 10*3/uL (ref 0.0–0.1)
Basophils Relative: 0 %
Eosinophils Absolute: 0.3 10*3/uL (ref 0.0–0.5)
Eosinophils Relative: 7 %
HCT: 32.8 % — ABNORMAL LOW (ref 39.0–52.0)
Hemoglobin: 10.5 g/dL — ABNORMAL LOW (ref 13.0–17.0)
Immature Granulocytes: 0 %
Lymphocytes Relative: 23 %
Lymphs Abs: 1 10*3/uL (ref 0.7–4.0)
MCH: 31.3 pg (ref 26.0–34.0)
MCHC: 32 g/dL (ref 30.0–36.0)
MCV: 97.6 fL (ref 80.0–100.0)
Monocytes Absolute: 0.6 10*3/uL (ref 0.1–1.0)
Monocytes Relative: 14 %
Neutro Abs: 2.3 10*3/uL (ref 1.7–7.7)
Neutrophils Relative %: 56 %
Platelets: 179 10*3/uL (ref 150–400)
RBC: 3.36 MIL/uL — ABNORMAL LOW (ref 4.22–5.81)
RDW: 14.6 % (ref 11.5–15.5)
WBC: 4.1 10*3/uL (ref 4.0–10.5)
nRBC: 0 % (ref 0.0–0.2)

## 2021-08-09 LAB — RESP PANEL BY RT-PCR (FLU A&B, COVID) ARPGX2
Influenza A by PCR: NEGATIVE
Influenza B by PCR: NEGATIVE
SARS Coronavirus 2 by RT PCR: POSITIVE — AB

## 2021-08-09 LAB — TROPONIN I (HIGH SENSITIVITY): Troponin I (High Sensitivity): 65 ng/L — ABNORMAL HIGH (ref <18)

## 2021-08-09 MED ORDER — MOLNUPIRAVIR EUA 200MG CAPSULE: 4.0000 | ORAL_CAPSULE | Freq: Two times a day (BID) | ORAL | 0 refills | Status: AC

## 2021-08-09 NOTE — ED Provider Notes (Addendum)
Emergency Medicine Provider Triage Evaluation Note  Samuel Butler , a 60 y.o. male  was evaluated in triage.  Pt complains of shortness of breath, cough, congestion.  Positive home COVID test today, exposed to wife who has COVID.  Patient symptoms started 2 days ago (Tuesday).  Patient is on chemo for amyloidosis, last treatment 1 month ago.  Review of Systems  Positive: SHOB, cough, congestion Negative: fever  Physical Exam  There were no vitals taken for this visit. Gen:   Awake, no distress   Resp:  Normal effort  MSK:   Moves extremities without difficulty  Other:  Lungs CTA, O2 sat 100% on room air  Medical Decision Making  Medically screening exam initiated at 8:41 AM.  Appropriate orders placed.  Sheldon A Cassity was informed that the remainder of the evaluation will be completed by another provider, this initial triage assessment does not replace that evaluation, and the importance of remaining in the ED until their evaluation is complete.  After triage complete, reports having CP, add on EKG and troponin.    Tacy Learn, PA-C 08/09/21 0844    Tacy Learn, PA-C 08/09/21 5436    Lucrezia Starch, MD 08/09/21 [phone]

## 2021-08-09 NOTE — ED Notes (Signed)
Pt refused vitals 

## 2021-08-09 NOTE — Telephone Encounter (Signed)
Noted  

## 2021-08-09 NOTE — Telephone Encounter (Signed)
Spoke with the patient to offer a virtual visit with Dr Maudie Mercury as PCP does not have any openings today.  Patient stated per the after hours nurse, he is currently on the way to the ER due to cough and chest tightness.  Message sent to PCP.

## 2021-08-09 NOTE — Telephone Encounter (Signed)
TCT patient's wife regarding pt's new diagnosis of Covid.  Advised that we have to cancel his appts for tomorrow, including his treatment.He can resume his treatments at his next scheduled appts on 09/07/21. Joseph Art' voiced understanding. She states pt is still in the ED @ [facility].  Advised that Dr. Lorenso Courier is aware of this.   Appts for 08/10/21 have been cancelled.  Dr. Lorenso Courier aware.

## 2021-08-09 NOTE — ED Provider Notes (Signed)
[facility] EMERGENCY DEPARTMENT Provider Note   CSN: [id number] Arrival date & time: 08/09/21  0825     History Chief Complaint  Patient presents with   Shortness of Breath    Samuel Butler is a 59 y.o. male with past medical history of light chain amyloidosis who is receiving chemotherapy with his oncologist, diabetes, HTN, IBD, pulmonary embolism who presents with new onset coronavirus diagnosed 2 days ago.  Last chemotherapy was on 9/2.  Patient reports has had some cough, shortness of breath.  Patient reports that his shortness of breath is chronic since his cancer diagnosis however.  Patient has been afebrile.  Patient has not been prescribed antiviral at this time.  Patient also continues to complain about blood in stool, was evaluated for blood in stool on 9/23, evaluation did show positive Hemoccult without mass in rectum, or hemorrhoids.  Patient has known history of IBS.  No significant change in diarrhea or stool since last evaluation on 9/23.   Shortness of Breath     Past Medical History:  Diagnosis Date   Cancer ([place])    DDD (degenerative disc disease), lumbar    per his report   Diabetes mellitus without complication ([place])    Gout 04/16/2013   Hyperlipidemia    Hypertension    IBD (inflammatory bowel disease) 04/05/2016   Pulmonary embolism ([place])    Tobacco use disorder 03/28/2015    Patient Active Problem List   Diagnosis Date Noted   Pain due to onychomycosis of toenails of both feet 05/16/2021   Coronary artery disease 03/13/2021   Acute pulmonary embolism ([place]) 10/18/2020   Groin pain, right 08/31/2020   Chronic viral hepatitis B without delta-agent ([place]) 08/21/2020   Amyloidosis ([place]) 08/21/2020   Anasarca 08/04/2020   Nephrotic syndrome 08/03/2020   AKI (acute kidney injury) ([place]) 08/03/2020   Hypokalemia 06/27/2020   Claudication in peripheral vascular disease ([place]) 06/26/2020   Hypertension associated with diabetes ([place])  05/25/2020   Peripheral arterial disease ([place]) 03/08/2020   Vitamin D deficiency 10/26/2019   Hypoalbuminemia 10/26/2019   IBD (inflammatory bowel disease) 04/05/2016   Tobacco use disorder 03/28/2015   Non-insulin treated type 2 diabetes mellitus ([place]) 01/25/2014   Essential hypertension, benign 04/16/2013   Hyperlipemia 04/16/2013   Gout 04/16/2013    Past Surgical History:  Procedure Laterality Date   ABDOMINAL AORTOGRAM W/LOWER EXTREMITY Right 06/26/2020   Procedure: ABDOMINAL AORTOGRAM W/LOWER EXTREMITY;  Surgeon: Lorretta Harp, MD;  Location: [facility];  Service: Cardiovascular;  Laterality: Right;   COLONOSCOPY     PERIPHERAL VASCULAR INTERVENTION  06/26/2020   Procedure: PERIPHERAL VASCULAR INTERVENTION;  Surgeon: Lorretta Harp, MD;  Location: [facility];  Service: Cardiovascular;;  Right SFA   TONSILLECTOMY         Family History  Problem Relation Age of Onset   Hypertension Father    Diabetes Father    Stroke Father    Pancreatic cancer Father    Colon cancer Brother    Esophageal cancer Neg Hx    Stomach cancer Neg Hx     Social History   Tobacco Use   Smoking status: Former    Types: Cigarettes    Quit date: 10/23/2016    Years since quitting: 4.7   Smokeless tobacco: Never   Tobacco comments:    per patient 5 cigarettes a day   Vaping Use   Vaping Use: Never used  Substance Use Topics   Alcohol  use: Not Currently    Comment: occasional   Drug use: Not Currently    Home Medications Prior to Admission medications   Medication Sig Start Date End Date Taking? Authorizing Provider  molnupiravir EUA (LAGEVRIO) 200 mg CAPS capsule Take 4 capsules (800 mg total) by mouth 2 (two) times daily for 5 days. 08/09/21 08/14/21 Yes Jannifer Fischler H, PA-C  acetaminophen (TYLENOL) 650 MG CR tablet Take 1,300 mg by mouth every 8 (eight) hours as needed for pain.     [provider]  acyclovir (ZOVIRAX) 400 MG tablet Take 1 tablet  by mouth twice daily 07/30/21   Orson Slick, MD  albuterol (VENTOLIN HFA) 108 (90 Base) MCG/ACT inhaler Inhale 2 puffs into the lungs every 6 (six) hours as needed for wheezing or shortness of breath. 09/28/20   Orson Slick, MD  atorvastatin (LIPITOR) 80 MG tablet Take 1 tablet by mouth once daily 06/13/21   Isaac Bliss, Rayford Halsted, MD  cilostazol (PLETAL) 50 MG tablet Take 1 tablet (50 mg total) by mouth 2 (two) times daily. 07/30/21   Lorretta Harp, MD  clopidogrel (PLAVIX) 75 MG tablet Take 1 tablet by mouth once daily with breakfast 07/18/21   Lorretta Harp, MD  dicyclomine (BENTYL) 20 MG tablet TAKE 1 TABLET BY MOUTH 4 TIMES DAILY 20  TO  30  MINUTES  BEFORE  MEAL(S)  AND  AT  BEDTIME 06/25/21   Levin Erp, PA  ELIQUIS 5 MG TABS tablet Take 1 tablet by mouth twice daily 05/31/21   Isaac Bliss, Rayford Halsted, MD  Evolocumab (REPATHA SURECLICK) 616 MG/ML SOAJ Inject 140 mg into the skin every 14 (fourteen) days. 12/28/20   Lorretta Harp, MD  ezetimibe (ZETIA) 10 MG tablet Take 1 tablet (10 mg total) by mouth daily. 11/16/20 05/21/22  Deberah Pelton, NP  HYDROcodone-acetaminophen (NORCO) 7.5-325 MG tablet hydrocodone 7.5 mg-acetaminophen 325 mg tablet  TAKE 1 TABLET BY MOUTH EVERY 6 HOURS AS NEEDED FOR MODERATE PAIN FOR UP TO 5 DAYS    [provider]  isosorbide mononitrate (IMDUR) 30 MG 24 hr tablet Take 1 tablet by mouth once daily 01/02/21   Isaac Bliss, Rayford Halsted, MD  loperamide (IMODIUM) 1 MG/5ML solution Take 2 mg by mouth as needed for diarrhea or loose stools.    [provider]  losartan (COZAAR) 50 MG tablet Take 1 tablet (50 mg total) by mouth daily. 07/25/20   Lorretta Harp, MD  metFORMIN (GLUCOPHAGE) 500 MG tablet Take 1 tablet by mouth once daily with breakfast 03/12/21   Isaac Bliss, Rayford Halsted, MD  ondansetron (ZOFRAN) 8 MG tablet TAKE 1 TABLET BY MOUTH EVERY 8 HOURS AS NEEDED FOR NAUSEA OR VOMITING 10/27/20   Orson Slick,  MD  pantoprazole (PROTONIX) 40 MG tablet Take 1 tablet (40 mg total) by mouth daily. 01/18/21   Isaac Bliss, Rayford Halsted, MD  potassium chloride SA (KLOR-CON) 20 MEQ tablet Take 1 tablet by mouth twice daily 08/03/21   Orson Slick, MD  torsemide (DEMADEX) 20 MG tablet Take 1 tablet (20 mg total) by mouth 2 (two) times daily. 40mg  BID x 3 days then back to 20mg  BID 11/29/20   Cleaver, [name], NP  VEMLIDY 25 MG TABS Take 1 tablet (25 mg total) by mouth daily. 05/29/21   Mignon Pine, DO  Vitamin D, Ergocalciferol, (DRISDOL) 1.25 MG (50000 UNIT) CAPS capsule Take 1 capsule by mouth once a  week 12/05/20   Isaac Bliss, Rayford Halsted, MD    Allergies    Crestor [rosuvastatin calcium]  Review of Systems   Review of Systems  Respiratory:  Positive for shortness of breath.   Gastrointestinal:  Positive for diarrhea.  All other systems reviewed and are negative.  Physical Exam Updated Vital Signs BP (!) 134/99 (BP Location: Right Arm)   Pulse 83   Temp 98.7 F (37.1 C)   Resp 15   Ht 5\' 11"  (1.803 m)   Wt 79.5 kg   SpO2 96%   BMI 24.46 kg/m   Physical Exam  ED Results / Procedures / Treatments   Labs (all labs ordered are listed, but only abnormal results are displayed) Labs Reviewed  RESP PANEL BY RT-PCR (FLU A&B, COVID) ARPGX2 - Abnormal; Notable for the following components:      Result Value   SARS Coronavirus 2 by RT PCR POSITIVE (*)    All other components within normal limits  CBC WITH DIFFERENTIAL/PLATELET - Abnormal; Notable for the following components:   RBC 3.36 (*)    Hemoglobin 10.5 (*)    HCT 32.8 (*)    All other components within normal limits  COMPREHENSIVE METABOLIC PANEL - Abnormal; Notable for the following components:   Glucose, Bld 101 (*)    Creatinine, Ser 1.57 (*)    Calcium 8.3 (*)    Total Protein 4.5 (*)    Albumin 2.1 (*)    GFR, Estimated 50 (*)    All other components within normal limits  TROPONIN I (HIGH SENSITIVITY) - Abnormal;  Notable for the following components:   Troponin I (High Sensitivity) 65 (*)    All other components within normal limits    EKG EKG Interpretation  Date/Time:  Thursday August 09 2021 08:39:01 EDT Ventricular Rate:  107 PR Interval:  176 QRS Duration: 98 QT Interval:  386 QTC Calculation: 515 R Axis:   -25 Text Interpretation: Sinus tachycardia Possible Anterior infarct , age undetermined Abnormal ECG Confirmed by Davonna Belling [phone]) on 08/09/2021 5:48:07 PM  Radiology DG Chest 2 View  Result Date: 08/09/2021 CLINICAL DATA:  Cough, COVID EXAM: CHEST - 2 VIEW COMPARISON:  08/03/2021 FINDINGS: Heart is borderline in size. Tortuosity of the thoracic aorta. No confluent airspace opacities or effusions. No acute bony abnormality. IMPRESSION: No active cardiopulmonary disease. Electronically Signed   By: Rolm Baptise M.D.   On: 08/09/2021 09:59    Procedures Procedures   Medications Ordered in ED Medications - No data to display  ED Course  I have reviewed the triage vital signs and the nursing notes.  Pertinent labs & imaging results that were available during my care of the patient were reviewed by me and considered in my medical decision making (see chart for details).    MDM Rules/Calculators/A&P                         Patient with a long history significant for ongoing amyloidosis being treated by oncology. Patient last received chemo around 1 month ago. Patient presents with worsening SOB, cough, covid + test at home. He also endorses some left sided chest tightness. Patient not in respiratory distress, denies ongoing chest pain during my interview. Non-ischemic EKG stable compared to baseline. VSS throughout extended visit other than some tachycardia and mild HTN. Patient does endorse ongoing bloody diarrhea. Patient was worked up for this issue at our ED on 08/03/21 and no hemorrhoids or other  rectal mass were found. Patient with known hx of IBS. Patient being followed  by GI for this issue.  Consulted with patient's Oncologist to discuss plans of care. Due to COVID diagnosis, patient cannot return to cancer center for 21 days from diagnosis. Recommend follow up with PCP for covid check up and GI for ongoing bloody diarrhea. CBC stable compared to baseline, patient is not asymptomatically short of breath. Will prescribe Molnupiravir. Patient agrees to plan. Return precautions given. Patient discharged in stable condition.  Final Clinical Impression(s) / ED Diagnoses Final diagnoses:  [id number]    Rx / DC Orders ED Discharge Orders          Ordered    molnupiravir EUA (LAGEVRIO) 200 mg CAPS capsule  2 times daily        08/09/21 1826             Dorien Chihuahua 08/09/21 1830    Davonna Belling, MD 08/09/21 2235

## 2021-08-09 NOTE — ED Triage Notes (Signed)
Pt states he has been SOB and had a home covid + test. Hx of CA and on chemo. Endorses left side chest tightness.

## 2021-08-10 ENCOUNTER — Inpatient Hospital Stay: Payer: BC Managed Care – PPO

## 2021-08-10 ENCOUNTER — Inpatient Hospital Stay: Payer: BC Managed Care – PPO | Admitting: Hematology and Oncology

## 2021-08-15 ENCOUNTER — Encounter: Payer: Self-pay | Admitting: Gastroenterology

## 2021-08-15 ENCOUNTER — Ambulatory Visit (INDEPENDENT_AMBULATORY_CARE_PROVIDER_SITE_OTHER): Payer: BC Managed Care – PPO | Admitting: Gastroenterology

## 2021-08-15 VITALS — BP 138/96 | HR 100 | Ht 71.0 in | Wt 172.2 lb

## 2021-08-15 DIAGNOSIS — R195 Other fecal abnormalities: Secondary | ICD-10-CM

## 2021-08-15 DIAGNOSIS — K648 Other hemorrhoids: Secondary | ICD-10-CM | POA: Diagnosis not present

## 2021-08-15 DIAGNOSIS — R1031 Right lower quadrant pain: Secondary | ICD-10-CM | POA: Diagnosis not present

## 2021-08-15 DIAGNOSIS — R197 Diarrhea, unspecified: Secondary | ICD-10-CM | POA: Diagnosis not present

## 2021-08-15 MED ORDER — GLYCOPYRROLATE 2 MG PO TABS
2.0000 mg | ORAL_TABLET | Freq: Two times a day (BID) | ORAL | 11 refills | Status: AC
Start: 2021-08-15 — End: ?

## 2021-08-15 MED ORDER — HYDROCORTISONE ACETATE 25 MG RE SUPP: 25.0000 mg | Freq: Every evening | RECTAL | 0 refills | Status: AC

## 2021-08-15 NOTE — Progress Notes (Signed)
    History of Present Illness: This is a 60 year old male with diarrhea, urgency and rectal bleeding.  He is accompanied by his wife.  He is being treated for renal amyloidosis. ED evaluation on 08/03/2021 showed heme + stool. ED evaluation on 08/09/2021 for Covid-19.  I have reviewed both ED visits.  Patient's main complaint is persistent urgent diarrhea that is difficult to control and intermittent rectal bleeding.  More frequent bowel movements appears to exacerbate his rectal bleeding.  He has had limited success with Imodium and dicyclomine.  He complains of right inguinal area swelling and intermittent discomfort.  Colonoscopy 03/2021 - Congested mucosa and patchy erythema in the rectum and in the sigmoid colon. Biopsied. Amyloid deposition in the arteriolar wall.  - Internal hemorrhoids. - The examination was otherwise normal on direct and retroflexion views. Random biopsies. As above.     Current Medications, Allergies, Past Medical History, Past Surgical History, Family History and Social History were reviewed in [employer] record.    Physical Exam: General: Well developed, well nourished, no acute distress Head: Normocephalic and atraumatic Eyes: Sclerae anicteric, EOMI Ears: Normal auditory acuity Mouth: Not examined, mask on during Covid-19 pandemic Lungs: Clear throughout to auscultation Heart: Regular rate and rhythm; no murmurs, rubs or bruits Abdomen: Soft, non tender and non distended. No masses, hepatosplenomegaly noted. Right inguinal hernia. Normal Bowel sounds Rectal: Performed in ED on 9/23, not repeated Musculoskeletal: Symmetrical with no gross deformities  Pulses:  Normal pulses noted Extremities: No clubbing, cyanosis, edema or deformities noted Neurological: Alert oriented x 4, grossly nonfocal Psychological:  Alert and cooperative. Normal mood and affect   Assessment and Recommendations:  Diarrhea, urgency, hematochezia, heme + stool.   Suspected amyloid colonic involvement causing diarrhea.    See #3 for ongoing treatment by Dr. Lorenso Courier.  We discussed in detail timing Imodium or Kaopectate or both 30 to 60 minutes before solid or liquid meals on a regular schedule to better manage diarrhea and urgency.  Change from dicyclomine to glycopyrrolate 2 mg p.o. twice daily taken 30 to 60 minutes before breakfast and dinner.  He is advised to call in a week and report progress for further adjustments to his regimen.  If he feels dicyclomine is more effective than glycopyrrolate we will resume dicyclomine 30 to 60 minutes before meals and at bedtime.  Hematochezia and heme + stool are from internal hemorrhoids and bleeding is exacerbated by ongoing diarrhea.  Begin Anusol HC suppositories PR at bedtime for 7 days and then daily as needed hemorrhoidal symptoms. Right inguinal hernia.  Schedule CT AP for further evaluation.  Consider surgical referral pending CT AP findings. Renal amyloidosis treated with chemotherapy and followed by Dr. Lorenso Courier. Chronic Hep B.  Elastography in October 2021 did not show findings of cirrhosis. History of PE maintained on Eliquis which is likely exacerbating his hemorrhoidal bleeding. PAD maintained on Plavix which is likely exacerbating his hemorrhoidal bleeding.

## 2021-08-15 NOTE — Patient Instructions (Signed)
Stop taking dicyclomine.   We have sent the following medications to your pharmacy for you to pick up at your convenience: glycopyrrolate to take one tablet by mouth twice daily before breakfast and dinner and Anusol suppositories to use daily.   Take over the counter Imodium and/or Kaopectate before meals.   Call back in one week with an update on your symptoms.   You have been scheduled for a CT scan of the abdomen and pelvis at [facility] ([address]---this is in the same building as [employer]).   You are scheduled on 08/16/21 at 3:30pm. You should arrive 15 minutes prior to your appointment time for registration. Please follow the written instructions below on the day of your exam:  WARNING: IF YOU ARE ALLERGIC TO IODINE/X-RAY DYE, PLEASE NOTIFY RADIOLOGY IMMEDIATELY AT [phone]! YOU WILL BE GIVEN A 13 HOUR PREMEDICATION PREP.  1) Do not eat anything after 11:30am (4 hours prior to your test) 2) You have been given 2 bottles of oral contrast to drink. The solution may taste better if refrigerated, but do NOT add ice or any other liquid to this solution. Shake well before drinking.    Drink 1 bottle of contrast @ 1:30pm (2 hours prior to your exam)  Drink 1 bottle of contrast @ 2:30pm (1 hour prior to your exam)  You may take any medications as prescribed with a small amount of water, if necessary. If you take any of the following medications: METFORMIN, GLUCOPHAGE, GLUCOVANCE, AVANDAMET, RIOMET, FORTAMET, [place] MET, JANUMET, GLUMETZA or METAGLIP, you MAY be asked to HOLD this medication 48 hours AFTER the exam.  The purpose of you drinking the oral contrast is to aid in the visualization of your intestinal tract. The contrast solution may cause some diarrhea. Depending on your individual set of symptoms, you may also receive an intravenous injection of x-ray contrast/dye. Plan on being at [facility] for 30 minutes or longer, depending on the  type of exam you are having performed.  This test typically takes 30-45 minutes to complete.  If you have any questions regarding your exam or if you need to reschedule, you may call the CT department at [phone] between the hours of 8:00 am and 5:00 pm, Monday-Friday.  ________________________________________________________________________  Due to recent changes in healthcare laws, you may see the results of your imaging and laboratory studies on MyChart before your provider has had a chance to review them.  We understand that in some cases there may be results that are confusing or concerning to you. Not all laboratory results come back in the same time frame and the provider may be waiting for multiple results in order to interpret others.  Please give [place] 48 hours in order for your provider to thoroughly review all the results before contacting the office for clarification of your results.   The [facility] providers would like to encourage you to use [facility] to communicate with providers for non-urgent requests or questions.  Due to long hold times on the telephone, sending your provider a message by [facility] may be a faster and more efficient way to get a response.  Please allow 48 business hours for a response.  Please remember that this is for non-urgent requests.   Thank you for choosing me and [facility].  Pricilla Riffle. Dagoberto Ligas., MD., [facility] Regal

## 2021-08-16 ENCOUNTER — Other Ambulatory Visit: Payer: Self-pay

## 2021-08-16 ENCOUNTER — Ambulatory Visit (INDEPENDENT_AMBULATORY_CARE_PROVIDER_SITE_OTHER)
Admission: RE | Admit: 2021-08-16 | Discharge: 2021-08-16 | Disposition: A | Discharge status: Home / Self Care | Payer: BC Managed Care – PPO | Source: Ambulatory Visit | Attending: Gastroenterology | Admitting: Gastroenterology

## 2021-08-16 DIAGNOSIS — R1031 Right lower quadrant pain: Secondary | ICD-10-CM

## 2021-08-17 ENCOUNTER — Telehealth: Payer: Self-pay | Admitting: Dietician

## 2021-08-17 NOTE — Telephone Encounter (Signed)
Nutrition  Contacted patient by telephone for nutrition follow-up.  Patient did not answer and I was unable to leave a message as the voice mailbox was full. Will continue to try to touch base with patient.

## 2021-08-19 ENCOUNTER — Other Ambulatory Visit: Payer: Self-pay | Admitting: General Practice

## 2021-08-19 ENCOUNTER — Other Ambulatory Visit: Payer: Self-pay | Admitting: Physician Assistant

## 2021-08-20 ENCOUNTER — Telehealth: Payer: Self-pay | Admitting: Gastroenterology

## 2021-08-20 NOTE — Telephone Encounter (Signed)
Call report from GI from CT performed 10/6, read yesterday 10/9. Patient with recent OV with Dr. Fuller Plan for diarrhea, hematochezia, and abdominal pain.   Dr. Hilarie Fredrickson you are MD of the day for this am, please review and advise if needs orders until Dr. Fuller Plan returns tomorrow

## 2021-08-20 NOTE — Telephone Encounter (Signed)
Inbound call from [facility] wanting to give CT scan report for patient.

## 2021-08-20 NOTE — Telephone Encounter (Signed)
Recent office visit with Dr. Fuller Plan, previous colonoscopy and his history of amyloidosis is reviewed as Dr. Berneice Gandy the day CT scan reviewed does confirm right inguinal hernia containing nonobstructive loops of small bowel.  There is also evidence for colitis in the left colon and rectum. This finding can wait for Dr. Lynne Leader opinion tomorrow If the patient is worried you can tell him that inflammation was seen in the left colon (which may be due to amyloidosis) as well as the right inguinal hernia and that he will hear from Dr. Fuller Plan regarding further recommendations tomorrow. He has been referred to surgery regarding hernia  Thanks JMP

## 2021-08-21 ENCOUNTER — Other Ambulatory Visit: Payer: Self-pay | Admitting: Hematology and Oncology

## 2021-08-21 ENCOUNTER — Other Ambulatory Visit: Payer: Self-pay | Admitting: Cardiovascular Disease

## 2021-08-24 ENCOUNTER — Encounter: Payer: Self-pay | Admitting: Internal Medicine

## 2021-08-24 ENCOUNTER — Encounter: Payer: Self-pay | Admitting: Hematology and Oncology

## 2021-08-24 ENCOUNTER — Encounter: Payer: Self-pay | Admitting: Pharmacist

## 2021-08-24 DIAGNOSIS — I10 Essential (primary) hypertension: Secondary | ICD-10-CM

## 2021-08-24 DIAGNOSIS — I3139 Other pericardial effusion (noninflammatory): Secondary | ICD-10-CM

## 2021-08-24 NOTE — Telephone Encounter (Signed)
Pt sent in multiple messages. Please see other encounter.

## 2021-08-27 ENCOUNTER — Ambulatory Visit (INDEPENDENT_AMBULATORY_CARE_PROVIDER_SITE_OTHER): Payer: BC Managed Care – PPO | Admitting: Podiatry

## 2021-08-27 ENCOUNTER — Encounter: Payer: Self-pay | Admitting: Podiatry

## 2021-08-27 ENCOUNTER — Other Ambulatory Visit: Payer: Self-pay

## 2021-08-27 DIAGNOSIS — N179 Acute kidney failure, unspecified: Secondary | ICD-10-CM | POA: Diagnosis not present

## 2021-08-27 DIAGNOSIS — M79674 Pain in right toe(s): Secondary | ICD-10-CM

## 2021-08-27 DIAGNOSIS — B351 Tinea unguium: Secondary | ICD-10-CM | POA: Diagnosis not present

## 2021-08-27 DIAGNOSIS — I739 Peripheral vascular disease, unspecified: Secondary | ICD-10-CM

## 2021-08-27 DIAGNOSIS — M79675 Pain in left toe(s): Secondary | ICD-10-CM | POA: Diagnosis not present

## 2021-08-27 NOTE — Progress Notes (Signed)
This patient returns to my office for at risk foot care.  This patient requires this care by a professional since this patient will be at risk due to having AKI, Claudication with PVD and PAD.   This patient is unable to cut nails himself since the patient cannot reach his nails.These nails are painful walking and wearing shoes.  This patient presents for at risk foot care today.  General Appearance  Alert, conversant and in no acute stress.  Vascular  Dorsalis pedis and posterior tibial  pulses are  weakly palpable  bilaterally.  Capillary return is within normal limits  bilaterally. Temperature is within normal limits  bilaterally.  Neurologic  Senn-Weinstein monofilament wire test within normal limits  bilaterally. Muscle power within normal limits bilaterally.  Nails Thick disfigured discolored nails with subungual debris  from hallux to fifth toes bilaterally. No evidence of bacterial infection or drainage bilaterally.  Orthopedic  No limitations of motion  feet .  No crepitus or effusions noted.  No bony pathology or digital deformities noted.  HAV  B/L>  Swelling 2+ ankles  B/L.  Skin  normotropic skin with no porokeratosis noted bilaterally.  No signs of infections or ulcers noted.     Onychomycosis  Pain in right toes  Pain in left toes  Consent was obtained for treatment procedures.   Mechanical debridement of nails 1-5  bilaterally performed with a nail nipper.  Filed with dremel without incident.    Return office visit   3 months                   Told patient to return for periodic foot care and evaluation due to potential at risk complications.   Gardiner Barefoot DPM

## 2021-08-28 ENCOUNTER — Encounter (HOSPITAL_COMMUNITY): Payer: Self-pay

## 2021-08-31 ENCOUNTER — Other Ambulatory Visit: Payer: Self-pay

## 2021-08-31 ENCOUNTER — Ambulatory Visit (INDEPENDENT_AMBULATORY_CARE_PROVIDER_SITE_OTHER): Payer: BC Managed Care – PPO | Admitting: Internal Medicine

## 2021-08-31 ENCOUNTER — Other Ambulatory Visit: Payer: Self-pay | Admitting: Internal Medicine

## 2021-08-31 ENCOUNTER — Encounter: Payer: Self-pay | Admitting: Internal Medicine

## 2021-08-31 ENCOUNTER — Other Ambulatory Visit: Payer: Self-pay | Admitting: Hematology and Oncology

## 2021-08-31 VITALS — BP 141/100 | HR 102 | Temp 99.1°F | Wt 172.0 lb

## 2021-08-31 DIAGNOSIS — B181 Chronic viral hepatitis B without delta-agent: Secondary | ICD-10-CM

## 2021-08-31 DIAGNOSIS — I2699 Other pulmonary embolism without acute cor pulmonale: Secondary | ICD-10-CM

## 2021-08-31 NOTE — Progress Notes (Signed)
[facility]  CHIEF COMPLAINT:    Follow up for hepatitis B follow up  SUBJECTIVE:    Samuel Butler is a 60 y.o. male with PMHx as below who presents to the clinic for hepatitis B follow up.   He is currently on Vemlidy 25mg  daily.  Recent AST/ALT 39/31 on 08/09/21.  Creatinine 1.57 with GFR 50.  His HBV DNA PCR was less than 10 on 05/29/21.  Presents today for follow up. Also, had CT abdomen/pelvis 08/16/21 with no focal liver abnormality.  Please see A&P for the details of today's visit and status of the patient's medical problems.   Patient's Medications  New Prescriptions   No medications on file  Previous Medications   ACETAMINOPHEN (TYLENOL) 650 MG CR TABLET    Take 1,300 mg by mouth every 8 (eight) hours as needed for pain.    ACYCLOVIR (ZOVIRAX) 400 MG TABLET    Take 1 tablet by mouth twice daily   ALBUTEROL (VENTOLIN HFA) 108 (90 BASE) MCG/ACT INHALER    Inhale 2 puffs into the lungs every 6 (six) hours as needed for wheezing or shortness of breath.   ALLOPURINOL (ZYLOPRIM) 100 MG TABLET    Take 100 mg by mouth daily.   ATORVASTATIN (LIPITOR) 80 MG TABLET    Take 1 tablet by mouth once daily   CILOSTAZOL (PLETAL) 50 MG TABLET    Take 1 tablet (50 mg total) by mouth 2 (two) times daily.   CLOPIDOGREL (PLAVIX) 75 MG TABLET    Take 1 tablet by mouth once daily with breakfast   DICYCLOMINE (BENTYL) 20 MG TABLET    TAKE 1 TABLET BY MOUTH 4 TIMES DAILY 20  TO  30  MINUTES  BEFORE  MEALS  AND  AT  BEDTIME   ELIQUIS 5 MG TABS TABLET    Take 1 tablet by mouth twice daily   EVOLOCUMAB (REPATHA SURECLICK) 096 MG/ML SOAJ    Inject 140 mg into the skin every 14 (fourteen) days.   EZETIMIBE (ZETIA) 10 MG TABLET    Take 1 tablet by mouth once daily   GLYCOPYRROLATE (ROBINUL) 2 MG TABLET    Take 1 tablet (2 mg total) by mouth 2 (two) times daily before a meal. Before breakfast and dinner   HYDROCODONE-ACETAMINOPHEN (NORCO) 7.5-325 MG TABLET    hydrocodone  7.5 mg-acetaminophen 325 mg tablet  TAKE 1 TABLET BY MOUTH EVERY 6 HOURS AS NEEDED FOR MODERATE PAIN FOR UP TO 5 DAYS   HYDROCORTISONE (ANUSOL-HC) 25 MG SUPPOSITORY    Place 1 suppository (25 mg total) rectally at bedtime.   ISOSORBIDE MONONITRATE (IMDUR) 30 MG 24 HR TABLET    Take 1 tablet by mouth once daily   LOPERAMIDE (IMODIUM) 1 MG/5ML SOLUTION    Take 2 mg by mouth as needed for diarrhea or loose stools.   LOSARTAN (COZAAR) 50 MG TABLET    Take 1 tablet by mouth once daily   METFORMIN (GLUCOPHAGE) 500 MG TABLET    Take 1 tablet by mouth once daily with breakfast   ONDANSETRON (ZOFRAN) 8 MG TABLET    TAKE 1 TABLET BY MOUTH EVERY 8 HOURS AS NEEDED FOR NAUSEA OR VOMITING   PANTOPRAZOLE (PROTONIX) 40 MG TABLET    Take 1 tablet (40 mg total) by mouth daily.   POTASSIUM CHLORIDE SA (KLOR-CON) 20 MEQ TABLET    Take 1 tablet by mouth twice daily   TORSEMIDE (DEMADEX) 20 MG TABLET  Take 1 tablet (20 mg total) by mouth 2 (two) times daily. 40mg  BID x 3 days then back to 20mg  BID   VEMLIDY 25 MG TABS    Take 1 tablet (25 mg total) by mouth daily.   VITAMIN D, ERGOCALCIFEROL, (DRISDOL) 1.25 MG (50000 UNIT) CAPS CAPSULE    Take 1 capsule by mouth once a week  Modified Medications   No medications on file  Discontinued Medications   No medications on file      Past Medical History:  Diagnosis Date   Cancer ([place])    DDD (degenerative disc disease), lumbar    per his report   Diabetes mellitus without complication ([place])    Gout 04/16/2013   Hyperlipidemia    Hypertension    IBD (inflammatory bowel disease) 04/05/2016   Pulmonary embolism ([place])    Tobacco use disorder 03/28/2015    Social History   Tobacco Use   Smoking status: Former    Types: Cigarettes    Quit date: 10/23/2016    Years since quitting: 4.8   Smokeless tobacco: Never   Tobacco comments:    per patient 5 cigarettes a day   Vaping Use   Vaping Use: Never used  Substance Use Topics   Alcohol use: Not Currently     Comment: occasional   Drug use: Not Currently    Family History  Problem Relation Age of Onset   Hypertension Father    Diabetes Father    Stroke Father    Pancreatic cancer Father    Colon cancer Brother    Esophageal cancer Neg Hx    Stomach cancer Neg Hx     Allergies  Allergen Reactions   Crestor [Rosuvastatin Calcium] Other (See Comments)    Joint aches    Review of Systems  Constitutional: Negative.   Gastrointestinal: Negative.   Musculoskeletal:        Left hand gout flare.   Skin: Negative.     OBJECTIVE:    Vitals:   08/31/21 1109  BP: (!) 141/100  Pulse: (!) 102  Temp: 99.1 F (37.3 C)  TempSrc: Temporal  SpO2: 96%  Weight: 172 lb (78 kg)   Body mass index is 23.99 kg/m.  Physical Exam Constitutional:      General: He is not in acute distress.    Appearance: Normal appearance.  HENT:     Head: Normocephalic and atraumatic.  Eyes:     Extraocular Movements: Extraocular movements intact.     Conjunctiva/sclera: Conjunctivae normal.  Pulmonary:     Effort: Pulmonary effort is normal. No respiratory distress.  Musculoskeletal:        General: Swelling (left hand swelling noted) present.  Neurological:     Mental Status: He is alert.     Labs and Microbiology: CBC Latest Ref Rng & Units 08/09/2021 08/03/2021 07/13/2021  WBC 4.0 - 10.5 K/uL 4.1 4.2 4.7  Hemoglobin 13.0 - 17.0 g/dL 10.5(L) 11.3(L) 10.7(L)  Hematocrit 39.0 - 52.0 % 32.8(L) 34.7(L) 31.8(L)  Platelets 150 - 400 K/uL 179 181 217   CMP Latest Ref Rng & Units 08/09/2021 08/03/2021 07/13/2021  Glucose 70 - 99 mg/dL 101(H) 89 92  BUN 8 - 23 mg/dL 13 19 18   Creatinine 0.61 - 1.24 mg/dL 1.57(H) 1.42(H) 1.36(H)  Sodium 135 - 145 mmol/L 142 146(H) 146(H)  Potassium 3.5 - 5.1 mmol/L 3.6 3.3(L) 3.7  Chloride 98 - 111 mmol/L 106 108 108  CO2 22 - 32 mmol/L 27 27 27   Calcium  8.9 - 10.3 mg/dL 8.3(L) 8.4(L) 8.6(L)  Total Protein 6.5 - 8.1 g/dL 4.5(L) 5.0(L) 4.9(L)  Total Bilirubin 0.3 - 1.2  mg/dL 0.6 0.4 0.4  Alkaline Phos 38 - 126 U/L 71 65 82  AST 15 - 41 U/L 39 27 22  ALT 0 - 44 U/L 31 29 21      No results found for this or any previous visit (from the past 240 hour(s)).    ASSESSMENT & PLAN:    Chronic viral hepatitis B without delta-agent ([place]) Patient continues on Vemlidy 25mg  daily and tolerating well thus far.  Will continue this in setting of immunosuppression.  Recent LFTs were normal.  Will recheck hepatitis B DNA PCR today and other hepatitis serology today along with repeat Fibrosure.  Recent imaging did not show any abnormality in the liver.     Orders Placed This Encounter  Procedures   Liver Fibrosis, FibroTest-ActiTest   Hepatitis B DNA, ultraquantitative, PCR   Hepatitis B e antibody   Hepatitis B e antigen   Hepatitis B surface antibody,qualitative   Hepatitis B surface antigen   Hepatitis B core antibody, total       [facility] 08/31/2021, 11:44 AM  I spent 30 minutes dedicated to the care of this patient on the date of this encounter to include pre-visit review of records, face-to-face time with the patient discussing HBV, and post-visit ordering of testing.

## 2021-08-31 NOTE — Patient Instructions (Signed)
Thank you for coming to see me today. It was a pleasure seeing you.  To Do: Labs today Continue Vemlidy daily Follow up in 6 months  If you have any questions or concerns, please do not hesitate to call the office at [phone].  Take Care,   Jule Ser

## 2021-08-31 NOTE — Assessment & Plan Note (Addendum)
Patient continues on Vemlidy 25mg  daily and tolerating well thus far.  Will continue this in setting of immunosuppression.  Recent LFTs were normal.  Will recheck hepatitis B DNA PCR today and other hepatitis serology today along with repeat Fibrosure.  Recent imaging did not show any abnormality in the liver.

## 2021-09-03 ENCOUNTER — Encounter: Payer: Self-pay | Admitting: Hematology and Oncology

## 2021-09-04 ENCOUNTER — Emergency Department (HOSPITAL_COMMUNITY): Payer: BC Managed Care – PPO

## 2021-09-04 ENCOUNTER — Other Ambulatory Visit: Payer: Self-pay

## 2021-09-04 ENCOUNTER — Encounter (HOSPITAL_COMMUNITY)
Admission: EM | Disposition: E | Payer: Self-pay | Source: Home / Self Care | Attending: Thoracic Surgery (Cardiothoracic Vascular Surgery)

## 2021-09-04 ENCOUNTER — Inpatient Hospital Stay (HOSPITAL_COMMUNITY): Payer: BC Managed Care – PPO | Admitting: Anesthesiology

## 2021-09-04 ENCOUNTER — Inpatient Hospital Stay (HOSPITAL_COMMUNITY): Payer: BC Managed Care – PPO

## 2021-09-04 ENCOUNTER — Encounter (HOSPITAL_COMMUNITY): Payer: Self-pay | Admitting: *Deleted

## 2021-09-04 ENCOUNTER — Inpatient Hospital Stay (HOSPITAL_COMMUNITY)
Admission: EM | Admit: 2021-09-04 | Discharge: 2021-09-11 | DRG: 270 | Disposition: E | Payer: BC Managed Care – PPO | Attending: Thoracic Surgery (Cardiothoracic Vascular Surgery) | Admitting: Thoracic Surgery (Cardiothoracic Vascular Surgery)

## 2021-09-04 DIAGNOSIS — Z833 Family history of diabetes mellitus: Secondary | ICD-10-CM

## 2021-09-04 DIAGNOSIS — N1832 Chronic kidney disease, stage 3b: Secondary | ICD-10-CM | POA: Diagnosis present

## 2021-09-04 DIAGNOSIS — U071 COVID-19: Secondary | ICD-10-CM | POA: Diagnosis present

## 2021-09-04 DIAGNOSIS — E559 Vitamin D deficiency, unspecified: Secondary | ICD-10-CM | POA: Diagnosis present

## 2021-09-04 DIAGNOSIS — E1151 Type 2 diabetes mellitus with diabetic peripheral angiopathy without gangrene: Secondary | ICD-10-CM | POA: Diagnosis present

## 2021-09-04 DIAGNOSIS — Z9221 Personal history of antineoplastic chemotherapy: Secondary | ICD-10-CM

## 2021-09-04 DIAGNOSIS — Z79899 Other long term (current) drug therapy: Secondary | ICD-10-CM | POA: Diagnosis not present

## 2021-09-04 DIAGNOSIS — E8581 Light chain (AL) amyloidosis: Secondary | ICD-10-CM | POA: Diagnosis present

## 2021-09-04 DIAGNOSIS — I7101 Dissection of ascending aorta: Principal | ICD-10-CM | POA: Diagnosis present

## 2021-09-04 DIAGNOSIS — I2510 Atherosclerotic heart disease of native coronary artery without angina pectoris: Secondary | ICD-10-CM | POA: Diagnosis present

## 2021-09-04 DIAGNOSIS — M109 Gout, unspecified: Secondary | ICD-10-CM | POA: Diagnosis present

## 2021-09-04 DIAGNOSIS — E1122 Type 2 diabetes mellitus with diabetic chronic kidney disease: Secondary | ICD-10-CM | POA: Diagnosis present

## 2021-09-04 DIAGNOSIS — Z7902 Long term (current) use of antithrombotics/antiplatelets: Secondary | ICD-10-CM

## 2021-09-04 DIAGNOSIS — Z87891 Personal history of nicotine dependence: Secondary | ICD-10-CM | POA: Diagnosis not present

## 2021-09-04 DIAGNOSIS — A419 Sepsis, unspecified organism: Secondary | ICD-10-CM

## 2021-09-04 DIAGNOSIS — I468 Cardiac arrest due to other underlying condition: Secondary | ICD-10-CM | POA: Diagnosis not present

## 2021-09-04 DIAGNOSIS — Z86711 Personal history of pulmonary embolism: Secondary | ICD-10-CM | POA: Diagnosis not present

## 2021-09-04 DIAGNOSIS — Z7984 Long term (current) use of oral hypoglycemic drugs: Secondary | ICD-10-CM

## 2021-09-04 DIAGNOSIS — I7100 Dissection of unspecified site of aorta: Secondary | ICD-10-CM | POA: Diagnosis present

## 2021-09-04 DIAGNOSIS — R1310 Dysphagia, unspecified: Secondary | ICD-10-CM

## 2021-09-04 DIAGNOSIS — R197 Diarrhea, unspecified: Secondary | ICD-10-CM

## 2021-09-04 DIAGNOSIS — Z800 Family history of malignant neoplasm of digestive organs: Secondary | ICD-10-CM

## 2021-09-04 DIAGNOSIS — Z7901 Long term (current) use of anticoagulants: Secondary | ICD-10-CM | POA: Diagnosis not present

## 2021-09-04 DIAGNOSIS — Z8249 Family history of ischemic heart disease and other diseases of the circulatory system: Secondary | ICD-10-CM | POA: Diagnosis not present

## 2021-09-04 DIAGNOSIS — R578 Other shock: Secondary | ICD-10-CM

## 2021-09-04 DIAGNOSIS — I129 Hypertensive chronic kidney disease with stage 1 through stage 4 chronic kidney disease, or unspecified chronic kidney disease: Secondary | ICD-10-CM | POA: Diagnosis present

## 2021-09-04 DIAGNOSIS — I3139 Other pericardial effusion (noninflammatory): Secondary | ICD-10-CM | POA: Diagnosis present

## 2021-09-04 DIAGNOSIS — B181 Chronic viral hepatitis B without delta-agent: Secondary | ICD-10-CM | POA: Diagnosis present

## 2021-09-04 DIAGNOSIS — E785 Hyperlipidemia, unspecified: Secondary | ICD-10-CM | POA: Diagnosis present

## 2021-09-04 DIAGNOSIS — I71012 Dissection of descending thoracic aorta: Secondary | ICD-10-CM | POA: Diagnosis not present

## 2021-09-04 HISTORY — PX: SUBXYPHOID PERICARDIAL WINDOW: SHX5075

## 2021-09-04 LAB — CBC WITH DIFFERENTIAL/PLATELET
Abs Immature Granulocytes: 0.03 10*3/uL (ref 0.00–0.07)
Basophils Absolute: 0 10*3/uL (ref 0.0–0.1)
Basophils Relative: 0 %
Eosinophils Absolute: 0.2 10*3/uL (ref 0.0–0.5)
Eosinophils Relative: 2 %
HCT: 29.5 % — ABNORMAL LOW (ref 39.0–52.0)
Hemoglobin: 9.4 g/dL — ABNORMAL LOW (ref 13.0–17.0)
Immature Granulocytes: 0 %
Lymphocytes Relative: 18 %
Lymphs Abs: 1.5 10*3/uL (ref 0.7–4.0)
MCH: 31.3 pg (ref 26.0–34.0)
MCHC: 31.9 g/dL (ref 30.0–36.0)
MCV: 98.3 fL (ref 80.0–100.0)
Monocytes Absolute: 0.7 10*3/uL (ref 0.1–1.0)
Monocytes Relative: 9 %
Neutro Abs: 6.1 10*3/uL (ref 1.7–7.7)
Neutrophils Relative %: 71 %
Platelets: 206 10*3/uL (ref 150–400)
RBC: 3 MIL/uL — ABNORMAL LOW (ref 4.22–5.81)
RDW: 15.3 % (ref 11.5–15.5)
WBC: 8.5 10*3/uL (ref 4.0–10.5)
nRBC: 0 % (ref 0.0–0.2)

## 2021-09-04 LAB — COMPREHENSIVE METABOLIC PANEL
ALT: 63 U/L — ABNORMAL HIGH (ref 0–44)
AST: 63 U/L — ABNORMAL HIGH (ref 15–41)
Albumin: 1.9 g/dL — ABNORMAL LOW (ref 3.5–5.0)
Alkaline Phosphatase: 53 U/L (ref 38–126)
Anion gap: 10 (ref 5–15)
BUN: 37 mg/dL — ABNORMAL HIGH (ref 8–23)
CO2: 25 mmol/L (ref 22–32)
Calcium: 8.3 mg/dL — ABNORMAL LOW (ref 8.9–10.3)
Chloride: 108 mmol/L (ref 98–111)
Creatinine, Ser: 1.58 mg/dL — ABNORMAL HIGH (ref 0.61–1.24)
GFR, Estimated: 49 mL/min — ABNORMAL LOW (ref >60)
Glucose, Bld: 151 mg/dL — ABNORMAL HIGH (ref 70–99)
Potassium: 3.5 mmol/L (ref 3.5–5.1)
Sodium: 143 mmol/L (ref 135–145)
Total Bilirubin: 0.6 mg/dL (ref 0.3–1.2)
Total Protein: 4.3 g/dL — ABNORMAL LOW (ref 6.5–8.1)

## 2021-09-04 LAB — RESP PANEL BY RT-PCR (FLU A&B, COVID) ARPGX2
Influenza A by PCR: NEGATIVE
Influenza B by PCR: NEGATIVE
SARS Coronavirus 2 by RT PCR: POSITIVE — AB

## 2021-09-04 LAB — ECHO INTRAOPERATIVE TEE
Height: 71 in
Weight: 2752 oz

## 2021-09-04 LAB — BRAIN NATRIURETIC PEPTIDE: B Natriuretic Peptide: 399.4 pg/mL — ABNORMAL HIGH (ref 0.0–100.0)

## 2021-09-04 LAB — LACTIC ACID, PLASMA: Lactic Acid, Venous: 4.3 mmol/L (ref 0.5–1.9)

## 2021-09-04 LAB — PROTIME-INR
INR: 1.1 (ref 0.8–1.2)
Prothrombin Time: 14.3 seconds (ref 11.4–15.2)

## 2021-09-04 LAB — APTT: aPTT: 24 seconds (ref 24–36)

## 2021-09-04 LAB — TROPONIN I (HIGH SENSITIVITY): Troponin I (High Sensitivity): 63 ng/L — ABNORMAL HIGH (ref <18)

## 2021-09-04 SURGERY — CREATION, PERICARDIAL WINDOW, SUBXIPHOID APPROACH
Anesthesia: General | Site: Chest

## 2021-09-04 MED ORDER — SODIUM BICARBONATE 8.4 % IV SOLN
INTRAVENOUS | Status: DC | PRN
  Administered 2021-09-04 (×2): 50 meq via INTRAVENOUS

## 2021-09-04 MED ORDER — CALCIUM CHLORIDE 10 % IV SOLN
INTRAVENOUS | Status: DC | PRN
  Administered 2021-09-04 (×2): 1 g via INTRAVENOUS

## 2021-09-04 MED ORDER — VANCOMYCIN HCL 1250 MG/250ML IV SOLN
1250.0000 mg | INTRAVENOUS | Status: DC
  Filled 2021-09-04: qty 250

## 2021-09-04 MED ORDER — DOCUSATE SODIUM 100 MG PO CAPS: 100.0000 mg | ORAL_CAPSULE | Freq: Two times a day (BID) | ORAL | Status: DC | PRN

## 2021-09-04 MED ORDER — PROPOFOL 10 MG/ML IV BOLUS
INTRAVENOUS | Status: AC
  Filled 2021-09-04: qty 20

## 2021-09-04 MED ORDER — NITROGLYCERIN IN D5W 200-5 MCG/ML-% IV SOLN
2.0000 ug/min | INTRAVENOUS | Status: DC
  Filled 2021-09-04: qty 250

## 2021-09-04 MED ORDER — SODIUM CHLORIDE 0.9 % IV SOLN
20.0000 ug | Freq: Once | INTRAVENOUS | Status: AC
  Administered 2021-09-04: 20 ug via INTRAVENOUS
  Filled 2021-09-04: qty 5

## 2021-09-04 MED ORDER — FENTANYL CITRATE (PF) 250 MCG/5ML IJ SOLN
INTRAMUSCULAR | Status: AC
  Filled 2021-09-04: qty 20

## 2021-09-04 MED ORDER — POLYETHYLENE GLYCOL 3350 17 G PO PACK: 17.0000 g | PACK | Freq: Every day | ORAL | Status: DC | PRN

## 2021-09-04 MED ORDER — PLASMA-LYTE A IV SOLN
INTRAVENOUS | Status: DC
  Filled 2021-09-04: qty 5

## 2021-09-04 MED ORDER — LACTATED RINGERS IV SOLN: INTRAVENOUS | Status: DC | PRN

## 2021-09-04 MED ORDER — CEFAZOLIN SODIUM-DEXTROSE 2-4 GM/100ML-% IV SOLN
2.0000 g | INTRAVENOUS | Status: DC
  Filled 2021-09-04: qty 100

## 2021-09-04 MED ORDER — SODIUM CHLORIDE 0.9 % IV SOLN: 10.0000 mL/h | Freq: Once | INTRAVENOUS | Status: DC

## 2021-09-04 MED ORDER — TRANEXAMIC ACID (OHS) PUMP PRIME SOLUTION
2.0000 mg/kg | INTRAVENOUS | Status: DC
  Filled 2021-09-04: qty 1.56

## 2021-09-04 MED ORDER — ALBUTEROL SULFATE (2.5 MG/3ML) 0.083% IN NEBU: 3.0000 mL | INHALATION_SOLUTION | Freq: Four times a day (QID) | RESPIRATORY_TRACT | Status: DC | PRN

## 2021-09-04 MED ORDER — PROTHROMBIN COMPLEX CONC HUMAN 500 UNITS IV KIT
3816.0000 [IU] | PACK | Status: AC
  Administered 2021-09-04: 3816 [IU] via INTRAVENOUS
  Filled 2021-09-04: qty 3290

## 2021-09-04 MED ORDER — MIDAZOLAM HCL 5 MG/5ML IJ SOLN
INTRAMUSCULAR | Status: DC | PRN
  Administered 2021-09-04: 5 mg via INTRAVENOUS

## 2021-09-04 MED ORDER — POTASSIUM CHLORIDE 2 MEQ/ML IV SOLN
80.0000 meq | INTRAVENOUS | Status: DC
  Filled 2021-09-04: qty 40

## 2021-09-04 MED ORDER — ESMOLOL HCL-SODIUM CHLORIDE 2000 MG/100ML IV SOLN
25.0000 ug/kg/min | INTRAVENOUS | Status: DC
Start: 2021-09-04 — End: 2021-09-05
  Administered 2021-09-04: 25 ug/kg/min via INTRAVENOUS

## 2021-09-04 MED ORDER — EPINEPHRINE HCL 5 MG/250ML IV SOLN IN NS
0.0000 ug/min | INTRAVENOUS | Status: DC
  Filled 2021-09-04: qty 250

## 2021-09-04 MED ORDER — SODIUM CHLORIDE 0.9 % IV SOLN
2.0000 g | Freq: Once | INTRAVENOUS | Status: AC
  Administered 2021-09-04: 2 g via INTRAVENOUS
  Filled 2021-09-04: qty 20

## 2021-09-04 MED ORDER — INSULIN REGULAR(HUMAN) IN NACL 100-0.9 UT/100ML-% IV SOLN
INTRAVENOUS | Status: DC
  Filled 2021-09-04: qty 100

## 2021-09-04 MED ORDER — PHENYLEPHRINE HCL-NACL 20-0.9 MG/250ML-% IV SOLN
30.0000 ug/min | INTRAVENOUS | Status: DC
  Filled 2021-09-04: qty 250

## 2021-09-04 MED ORDER — SUCCINYLCHOLINE CHLORIDE 200 MG/10ML IV SOSY
PREFILLED_SYRINGE | INTRAVENOUS | Status: DC | PRN
  Administered 2021-09-04: 110 mg via INTRAVENOUS

## 2021-09-04 MED ORDER — INSULIN ASPART 100 UNIT/ML IJ SOLN: 0.0000 [IU] | INTRAMUSCULAR | Status: DC

## 2021-09-04 MED ORDER — MORPHINE SULFATE (PF) 4 MG/ML IV SOLN
4.0000 mg | Freq: Once | INTRAVENOUS | Status: AC
Start: 2021-09-04 — End: 2021-09-04
  Administered 2021-09-04: 4 mg via INTRAVENOUS
  Filled 2021-09-04: qty 1

## 2021-09-04 MED ORDER — TRANEXAMIC ACID 1000 MG/10ML IV SOLN
1.5000 mg/kg/h | INTRAVENOUS | Status: DC
  Filled 2021-09-04: qty 25

## 2021-09-04 MED ORDER — IOHEXOL 350 MG/ML SOLN
100.0000 mL | Freq: Once | INTRAVENOUS | Status: AC | PRN
  Administered 2021-09-04: 100 mL via INTRAVENOUS

## 2021-09-04 MED ORDER — DEXMEDETOMIDINE HCL IN NACL 400 MCG/100ML IV SOLN
0.1000 ug/kg/h | INTRAVENOUS | Status: DC
  Filled 2021-09-04: qty 100

## 2021-09-04 MED ORDER — ETOMIDATE 2 MG/ML IV SOLN
INTRAVENOUS | Status: AC
  Filled 2021-09-04: qty 10

## 2021-09-04 MED ORDER — VASOPRESSIN 20 UNIT/ML IV SOLN
INTRAVENOUS | Status: AC
  Filled 2021-09-04: qty 1

## 2021-09-04 MED ORDER — LABETALOL HCL 5 MG/ML IV SOLN
INTRAVENOUS | Status: AC
  Administered 2021-09-04: 20 mg via INTRAVENOUS
  Filled 2021-09-04: qty 4

## 2021-09-04 MED ORDER — MIDAZOLAM HCL (PF) 10 MG/2ML IJ SOLN
INTRAMUSCULAR | Status: AC
  Filled 2021-09-04: qty 2

## 2021-09-04 MED ORDER — NOREPINEPHRINE 4 MG/250ML-% IV SOLN
0.0000 ug/min | INTRAVENOUS | Status: DC
  Filled 2021-09-04: qty 250

## 2021-09-04 MED ORDER — MILRINONE LACTATE IN DEXTROSE 20-5 MG/100ML-% IV SOLN
0.3000 ug/kg/min | INTRAVENOUS | Status: DC
  Filled 2021-09-04: qty 100

## 2021-09-04 MED ORDER — LACTATED RINGERS IV BOLUS
1000.0000 mL | Freq: Once | INTRAVENOUS | Status: AC
  Administered 2021-09-04: 1000 mL via INTRAVENOUS

## 2021-09-04 MED ORDER — 0.9 % SODIUM CHLORIDE (POUR BTL) OPTIME
TOPICAL | Status: DC | PRN
  Administered 2021-09-04: 4000 mL

## 2021-09-04 MED ORDER — EPINEPHRINE 1 MG/10ML IJ SOSY
PREFILLED_SYRINGE | INTRAMUSCULAR | Status: DC | PRN
  Administered 2021-09-04 (×10): 1 mg via INTRAVENOUS

## 2021-09-04 MED ORDER — HEPARIN 30,000 UNITS/1000 ML (OHS) CELLSAVER SOLUTION
Status: DC
  Filled 2021-09-04: qty 1000

## 2021-09-04 MED ORDER — TRANEXAMIC ACID (OHS) BOLUS VIA INFUSION
15.0000 mg/kg | INTRAVENOUS | Status: DC
  Filled 2021-09-04: qty 1170

## 2021-09-04 MED ORDER — LABETALOL HCL 5 MG/ML IV SOLN: 20.0000 mg | Freq: Once | INTRAVENOUS | Status: AC

## 2021-09-04 MED ORDER — MANNITOL 20 % IV SOLN
INTRAVENOUS | Status: DC
  Filled 2021-09-04: qty 13

## 2021-09-04 MED ORDER — VASOPRESSIN 20 UNIT/ML IV SOLN
INTRAVENOUS | Status: DC | PRN
  Administered 2021-09-04 (×3): 20 [IU] via INTRAVENOUS

## 2021-09-04 SURGICAL SUPPLY — 72 items
ADAPTER CARDIO PERF ANTE/RETRO (ADAPTER) ×1 IMPLANT
ADH SRG 12 PREFL SYR 3 SPRDR (MISCELLANEOUS)
ADPR PRFSN 84XANTGRD RTRGD (ADAPTER)
APL SWBSTK 6 STRL LF DISP (MISCELLANEOUS)
APPLICATOR COTTON TIP 6 STRL (MISCELLANEOUS) IMPLANT
APPLICATOR COTTON TIP 6IN STRL (MISCELLANEOUS) IMPLANT
BAG DECANTER FOR FLEXI CONT (MISCELLANEOUS) ×3 IMPLANT
BLADE CLIPPER SURG (BLADE) ×3 IMPLANT
BLADE STERNUM SYSTEM 6 (BLADE) ×1 IMPLANT
BLADE SURG 15 STRL LF DISP TIS (BLADE) ×2 IMPLANT
BLADE SURG 15 STRL SS (BLADE) ×3
CANISTER SUCT 3000ML PPV (MISCELLANEOUS) ×1 IMPLANT
CANNULA GUNDRY RCSP 15FR (MISCELLANEOUS) ×1 IMPLANT
CATH ROBINSON RED A/P 18FR (CATHETERS) ×2 IMPLANT
CAUTERY EYE LOW TEMP 1300F FIN (OPHTHALMIC RELATED) ×1 IMPLANT
CLIP FOGARTY SPRING 6M (CLIP) IMPLANT
CONTAINER PROTECT SURGISLUSH (MISCELLANEOUS) ×1 IMPLANT
DRAPE WARM FLUID 44X44 (DRAPES) IMPLANT
DRSG COVADERM 4X14 (GAUZE/BANDAGES/DRESSINGS) ×1 IMPLANT
DRSG COVADERM 4X8 (GAUZE/BANDAGES/DRESSINGS) ×2 IMPLANT
ELECT REM PT RETURN 9FT ADLT (ELECTROSURGICAL) ×6
ELECTRODE REM PT RTRN 9FT ADLT (ELECTROSURGICAL) ×4 IMPLANT
FELT TEFLON 1X6 (MISCELLANEOUS) ×3 IMPLANT
GAUZE SPONGE 4X4 12PLY STRL (GAUZE/BANDAGES/DRESSINGS) ×6 IMPLANT
GLOVE SURG SIGNA 7.5 PF LTX (GLOVE) ×9 IMPLANT
GOWN STRL REUS W/ TWL LRG LVL3 (GOWN DISPOSABLE) ×8 IMPLANT
GOWN STRL REUS W/TWL LRG LVL3 (GOWN DISPOSABLE) ×12
HEMOSTAT POWDER SURGIFOAM 1G (HEMOSTASIS) ×3 IMPLANT
HEMOSTAT SURGICEL 2X14 (HEMOSTASIS) IMPLANT
INSERT FOGARTY XLG (MISCELLANEOUS) IMPLANT
KIT BASIN OR (CUSTOM PROCEDURE TRAY) ×3 IMPLANT
KIT SUCTION CATH 14FR (SUCTIONS) ×6 IMPLANT
KIT TURNOVER KIT B (KITS) ×3 IMPLANT
NS IRRIG 1000ML POUR BTL (IV SOLUTION) ×4 IMPLANT
PACK OPEN HEART (CUSTOM PROCEDURE TRAY) ×3 IMPLANT
PAD ARMBOARD 7.5X6 YLW CONV (MISCELLANEOUS) ×6 IMPLANT
POSITIONER HEAD DONUT 9IN (MISCELLANEOUS) ×3 IMPLANT
SUT EB EXC GRN/WHT 2-0 V-5 (SUTURE) ×2 IMPLANT
SUT ETHIBON EXCEL 2-0 V-5 (SUTURE) IMPLANT
SUT ETHIBOND 2 0 SH (SUTURE)
SUT ETHIBOND 2 0 SH 36X2 (SUTURE) ×1 IMPLANT
SUT ETHIBOND 2 0 V4 (SUTURE) IMPLANT
SUT ETHIBOND 2 0V4 GREEN (SUTURE) IMPLANT
SUT ETHIBOND 4 0 RB 1 (SUTURE) IMPLANT
SUT ETHIBOND V-5 VALVE (SUTURE) IMPLANT
SUT PROLENE 3 0 SH 1 (SUTURE) ×2 IMPLANT
SUT PROLENE 3 0 SH DA (SUTURE) ×1 IMPLANT
SUT PROLENE 4 0 RB 1 (SUTURE)
SUT PROLENE 4-0 RB1 .5 CRCL 36 (SUTURE) IMPLANT
SUT PROLENE 5 0 C 1 36 (SUTURE) ×2 IMPLANT
SUT SILK  1 MH (SUTURE) ×6
SUT SILK 1 MH (SUTURE) ×4 IMPLANT
SUT SILK 1 TIES 10X30 (SUTURE) ×3 IMPLANT
SUT SILK 2 0 (SUTURE) ×3
SUT SILK 2 0 SH CR/8 (SUTURE) ×6 IMPLANT
SUT SILK 2-0 18XBRD TIE 12 (SUTURE) ×2 IMPLANT
SUT SILK 3 0 SH CR/8 (SUTURE) ×3 IMPLANT
SUT SILK 4 0 (SUTURE) ×3
SUT SILK 4-0 18XBRD TIE 12 (SUTURE) ×2 IMPLANT
SUT STEEL 6MS V (SUTURE) IMPLANT
SUT TEM PAC WIRE 2 0 SH (SUTURE) ×4 IMPLANT
SUT VIC AB 1 CTX 36 (SUTURE) ×6
SUT VIC AB 1 CTX36XBRD ANBCTR (SUTURE) ×4 IMPLANT
SUT VIC AB 2-0 CTX 27 (SUTURE) ×6 IMPLANT
SUT VIC AB 3-0 X1 27 (SUTURE) ×6 IMPLANT
SYR 10ML KIT SKIN ADHESIVE (MISCELLANEOUS) IMPLANT
SYSTEM SAHARA CHEST DRAIN ATS (WOUND CARE) ×1 IMPLANT
TOWEL GREEN STERILE (TOWEL DISPOSABLE) ×3 IMPLANT
TOWEL GREEN STERILE FF (TOWEL DISPOSABLE) ×3 IMPLANT
TRAY FOLEY SLVR 14FR TEMP STAT (SET/KITS/TRAYS/PACK) ×3 IMPLANT
UNDERPAD 30X36 HEAVY ABSORB (UNDERPADS AND DIAPERS) ×3 IMPLANT
WATER STERILE IRR 1000ML POUR (IV SOLUTION) ×2 IMPLANT

## 2021-09-05 ENCOUNTER — Encounter (HOSPITAL_COMMUNITY): Payer: Self-pay | Admitting: Thoracic Surgery (Cardiothoracic Vascular Surgery)

## 2021-09-05 ENCOUNTER — Ambulatory Visit (HOSPITAL_COMMUNITY): Payer: BC Managed Care – PPO

## 2021-09-05 ENCOUNTER — Ambulatory Visit: Payer: BC Managed Care – PPO | Admitting: Cardiovascular Disease

## 2021-09-05 ENCOUNTER — Encounter (HOSPITAL_COMMUNITY): Payer: Self-pay

## 2021-09-05 LAB — BPAM FFP
Blood Product Expiration Date: 202210292359
Blood Product Expiration Date: 202210292359
Blood Product Expiration Date: 202210302359
Blood Product Expiration Date: 202210302359
Blood Product Expiration Date: 202210302359
Blood Product Expiration Date: 202210302359
Blood Product Expiration Date: 202210312359
Blood Product Expiration Date: 202211052359
Blood Product Expiration Date: 202211052359
Blood Product Expiration Date: 202211072359
Blood Product Expiration Date: 202211082359
Blood Product Expiration Date: 202211082359
Blood Product Expiration Date: 202211082359
ISSUE DATE / TIME: 202210251856
ISSUE DATE / TIME: 202210251856
ISSUE DATE / TIME: 202210251856
ISSUE DATE / TIME: 202210251856
ISSUE DATE / TIME: 202210251905
ISSUE DATE / TIME: 202210251905
ISSUE DATE / TIME: 202210251905
ISSUE DATE / TIME: 202210251905
ISSUE DATE / TIME: 202210251934
ISSUE DATE / TIME: 202210251934
ISSUE DATE / TIME: 202210260948
ISSUE DATE / TIME: 202210260948
ISSUE DATE / TIME: 202210260948
Unit Type and Rh: 600
Unit Type and Rh: 600
Unit Type and Rh: 6200
Unit Type and Rh: 6200
Unit Type and Rh: 6200
Unit Type and Rh: 6200
Unit Type and Rh: 6200
Unit Type and Rh: 6200
Unit Type and Rh: 6200
Unit Type and Rh: 6200
Unit Type and Rh: 6200
Unit Type and Rh: 6200
Unit Type and Rh: 6200

## 2021-09-05 LAB — PREPARE PLATELET PHERESIS
Unit division: 0
Unit division: 0

## 2021-09-05 LAB — PREPARE FRESH FROZEN PLASMA
Unit division: 0
Unit division: 0
Unit division: 0
Unit division: 0
Unit division: 0
Unit division: 0
Unit division: 0
Unit division: 0
Unit division: 0
Unit division: 0
Unit division: 0

## 2021-09-05 LAB — LIVER FIBROSIS, FIBROTEST-ACTITEST
ALT: 25 U/L (ref 9–46)
Alpha-2-Macroglobulin: 318 mg/dL — ABNORMAL HIGH (ref 106–279)
Apolipoprotein A1: 149 mg/dL (ref 94–176)
Bilirubin: 0.3 mg/dL (ref 0.2–1.2)
Fibrosis Score: 0.19
GGT: 14 U/L (ref 3–70)
Haptoglobin: 408 mg/dL — ABNORMAL HIGH (ref 43–212)
Necroinflammat ACT Score: 0.1
Reference ID: 4081911

## 2021-09-05 LAB — BPAM PLATELET PHERESIS
Blood Product Expiration Date: 202210262359
Blood Product Expiration Date: 202210262359
ISSUE DATE / TIME: 202210251906
ISSUE DATE / TIME: 202210261001
Unit Type and Rh: 6200
Unit Type and Rh: 6200

## 2021-09-05 LAB — BPAM CRYOPRECIPITATE
Blood Product Expiration Date: 202210260118
ISSUE DATE / TIME: 202210251933
Unit Type and Rh: 6200

## 2021-09-05 LAB — HEPATITIS B SURFACE ANTIBODY,QUALITATIVE: Hep B S Ab: NONREACTIVE

## 2021-09-05 LAB — PREPARE RBC (CROSSMATCH)

## 2021-09-05 LAB — HEPATITIS B SURFACE ANTIGEN: Hepatitis B Surface Ag: REACTIVE — AB

## 2021-09-05 LAB — PREPARE CRYOPRECIPITATE: Unit division: 0

## 2021-09-05 LAB — HEPATITIS B CORE ANTIBODY, TOTAL: Hep B Core Total Ab: REACTIVE — AB

## 2021-09-05 LAB — HEPATITIS B DNA, ULTRAQUANTITATIVE, PCR
Hepatitis B DNA (Calc): <1 Log IU/mL — ABNORMAL HIGH
Hepatitis B DNA: <10 IU/mL — ABNORMAL HIGH

## 2021-09-05 LAB — MASSIVE TRANSFUSION PROTOCOL ORDER (BLOOD BANK NOTIFICATION)

## 2021-09-05 LAB — HEPATITIS B E ANTIBODY: Hep B E Ab: REACTIVE — AB

## 2021-09-05 LAB — HEPATITIS B E ANTIGEN: Hep B E Ag: NONREACTIVE

## 2021-09-07 ENCOUNTER — Ambulatory Visit: Payer: BC Managed Care – PPO | Admitting: Hematology and Oncology

## 2021-09-07 ENCOUNTER — Other Ambulatory Visit: Payer: BC Managed Care – PPO

## 2021-09-07 ENCOUNTER — Ambulatory Visit: Payer: BC Managed Care – PPO

## 2021-09-07 LAB — TYPE AND SCREEN
ABO/RH(D): A POS
Antibody Screen: POSITIVE
Unit division: 0
Unit division: 0
Unit division: 0
Unit division: 0
Unit division: 0
Unit division: 0
Unit division: 0
Unit division: 0
Unit division: 0
Unit division: 0
Unit division: 0
Unit division: 0
Unit division: 0
Unit division: 0
Unit division: 0
Unit division: 0

## 2021-09-07 LAB — BPAM RBC
Blood Product Expiration Date: 202211052359
Blood Product Expiration Date: 202211052359
Blood Product Expiration Date: 202211052359
Blood Product Expiration Date: 202211062359
Blood Product Expiration Date: 202211072359
Blood Product Expiration Date: 202211072359
Blood Product Expiration Date: 202211072359
Blood Product Expiration Date: 202211072359
Blood Product Expiration Date: 202211212359
Blood Product Expiration Date: 202211212359
Blood Product Expiration Date: 202211212359
Blood Product Expiration Date: 202211212359
Blood Product Expiration Date: 202211212359
Blood Product Expiration Date: 202211212359
Blood Product Expiration Date: 202211212359
Blood Product Expiration Date: 202211212359
ISSUE DATE / TIME: 202210251852
ISSUE DATE / TIME: 202210251852
ISSUE DATE / TIME: 202210251852
ISSUE DATE / TIME: 202210251852
ISSUE DATE / TIME: 202210251903
ISSUE DATE / TIME: 202210251903
ISSUE DATE / TIME: 202210251903
ISSUE DATE / TIME: 202210251903
ISSUE DATE / TIME: 202210252108
ISSUE DATE / TIME: 202210252108
ISSUE DATE / TIME: 202210252108
ISSUE DATE / TIME: 202210252108
ISSUE DATE / TIME: 202210261113
ISSUE DATE / TIME: 202210261130
ISSUE DATE / TIME: 202210261427
ISSUE DATE / TIME: 202210262000
Unit Type and Rh: 5100
Unit Type and Rh: 5100
Unit Type and Rh: 5100
Unit Type and Rh: 5100
Unit Type and Rh: 5100
Unit Type and Rh: 5100
Unit Type and Rh: 5100
Unit Type and Rh: 5100
Unit Type and Rh: 6200
Unit Type and Rh: 6200
Unit Type and Rh: 6200
Unit Type and Rh: 6200
Unit Type and Rh: 6200
Unit Type and Rh: 6200
Unit Type and Rh: 6200
Unit Type and Rh: 6200

## 2021-09-09 LAB — CULTURE, BLOOD (ROUTINE X 2)
Culture: NO GROWTH
Culture: NO GROWTH
Special Requests: ADEQUATE

## 2021-09-11 NOTE — ED Notes (Signed)
Pt oxygen saturation 88% room air. Placed 3L nasal cannula, 96%

## 2021-09-11 NOTE — ED Notes (Signed)
Pt gave verbal order for surgery to Dr. Kary Kos, cardiothoracic surgeon.

## 2021-09-11 NOTE — ED Notes (Signed)
Chaplain at pt bedside

## 2021-09-11 NOTE — Death Summary Note (Signed)
DEATH SUMMARY   Patient Details  Name: Samuel Butler MRN: [id number] DOB: [birth year]  Admission/Discharge Information   Admit Date:  09-27-2021  Date of Death: Date of Death: September 27, 2021  Time of Death: Time of Death: 02/17/13  Length of Stay: 0  Referring Physician: Isaac Bliss, Rayford Halsted, MD   Reason(s) for Hospitalization  Acute type I aortic dissection  Diagnoses  Preliminary cause of death:  Secondary Diagnoses (including complications and co-morbidities):  Active Problems:   Aortic dissection [facility])   Brief Hospital Course (including significant findings, care, treatment, and services provided and events leading to death)  Samuel Butler is a 60 y.o. year old male who presented with chest pain and right leg pain.  He has a past medical history significant for hypertension, hyperlipidemia, diabetes, amyloidosis, stage IIIb chronic kidney disease, tobacco abuse, PE, PAD, gout, hepatitis B, and a recently discovered 5.1 cm aneurysm.  He had acute onset of chest pain and right leg pain around 2 PM this afternoon.  He was brought to the ED.  He was initially hypotensive.  He was started on massive transfusion protocol which was stopped because of hypertension.  He was on Eliquis and Plavix chronically at home.  He was given Kcentra in the emergency department.  Initial consideration was given to medical management but he had ongoing chest pain and question of malperfusion of the right leg.  The decision was made to offer him surgical repair of the type I dissection.  He was informed of the indications, risks, benefits, and alternatives.  He agreed to proceed.  He was brought to the operating room emergently.  Dr. Suzette Battiest of anesthesia placed bilateral arterial blood pressure monitoring lines.  Just prior to induction he had a sudden loss of consciousness and loss of pulse.  He became profoundly bradycardic.  He had pulseless electrical activity.  He did not respond to  resuscitative measures.  TEE showed no cardiac activity of significance.  There was a large pericardial effusion.  A subxiphoid window was performed with evacuation of approximately a liter of initially dark and then bright red blood.  He still failed to respond to any resuscitative measures.  He was pronounced dead at 02/17/13.    Pertinent Labs and Studies  Significant Diagnostic Studies CT ABDOMEN PELVIS WO CONTRAST  Result Date: 08/19/2021 CLINICAL DATA:  Evaluate for inguinal hernia. Right lower quadrant pain. EXAM: CT ABDOMEN AND PELVIS WITHOUT CONTRAST TECHNIQUE: Multidetector CT imaging of the abdomen and pelvis was performed following the standard protocol without IV contrast. COMPARISON:  03/23/2009 FINDINGS: Lower chest: Small to moderate pericardial effusion is noted, image 7/2. Lung bases appear clear. Calcified granulomas identified throughout both lobes of liver. Gallbladder appears decompressed. Hepatobiliary: No focal liver abnormality is seen. No gallstones, gallbladder wall thickening, or biliary dilatation. Pancreas: Unremarkable. No pancreatic ductal dilatation or surrounding inflammatory changes. Spleen: Calcified granulomas identified. Spleen normal in size and otherwise unremarkable. Adrenals/Urinary Tract: Normal adrenal glands. Bilateral kidney cysts are identified. The largest arises off the upper pole of left kidney measuring 5.1 cm. No nephrolithiasis or hydronephrosis identified. Urinary bladder appears unremarkable. Stomach/Bowel: Stomach is normal. The appendix is visualized and is within normal limits. No small bowel wall thickening, inflammation or distension. There is mild wall thickening with intramural fatty deposition involving the descending colon, sigmoid colon and rectum concerning for colitis. No signs of pneumatosis or bowel perforation. Vascular/Lymphatic: Aortic atherosclerosis. No aneurysm. No abdominopelvic adenopathy. Reproductive: Prostate gland enlargement with  mass effect upon  the bladder base. Other: There is a large right inguinal hernia containing nonobstructed loops of small bowel. This measures 9.9 x 5.2 by 3.3 cm, image 24/5. Fluid identified within the right inguinal canal noted. Small fat containing umbilical hernia is also identified, image 43/2. No free fluid or fluid collections. Mild diffuse body wall edema is noted compatible with anasarca. Musculoskeletal: Mild lumbar spondylosis. No acute or significant osseous findings. IMPRESSION: 1. Large right inguinal hernia containing nonobstructed loops of small bowel. 2. Small fat containing umbilical hernia. 3. Mild wall thickening with intramural fatty deposition involving the descending colon, sigmoid colon and rectum concerning for colitis. No signs of pneumatosis or bowel perforation. 4. Small to moderate pericardial effusion. 5. Bilateral kidney cysts. 6. Prostate gland enlargement. 7. Aortic Atherosclerosis (ICD10-I70.0). Electronically Signed   By: Kerby Moors M.D.   On: 08/19/2021 14:52   DG Chest 1 View  Result Date: 09-29-2021 CLINICAL DATA:  Chest pain and shortness of breath. EXAM: CHEST  1 VIEW COMPARISON:  August 09, 2021 FINDINGS: The lungs are hyperinflated. Mild bibasilar atelectasis is noted. There is no evidence of a pleural effusion or pneumothorax. The heart size and mediastinal contours are within normal limits. Degenerative changes seen throughout the thoracic spine. IMPRESSION: Mild bibasilar atelectasis. Electronically Signed   By: Virgina Norfolk M.D.   On: 29-Sep-2021 19:12   DG Chest 2 View  Result Date: 08/09/2021 CLINICAL DATA:  Cough, COVID EXAM: CHEST - 2 VIEW COMPARISON:  08/03/2021 FINDINGS: Heart is borderline in size. Tortuosity of the thoracic aorta. No confluent airspace opacities or effusions. No acute bony abnormality. IMPRESSION: No active cardiopulmonary disease. Electronically Signed   By: Rolm Baptise M.D.   On: 08/09/2021 09:59   CT Angio Chest/Abd/Pel  for Dissection W and/or Wo Contrast  Result Date: 29-Sep-2021 CLINICAL DATA:  Chest pain and hypotension. EXAM: CT ANGIOGRAPHY CHEST, ABDOMEN AND PELVIS TECHNIQUE: Pre contrast imaging of the chest was performed. Multidetector CT imaging through the chest, abdomen and pelvis was performed using the standard protocol during bolus administration of intravenous contrast. Multiplanar reconstructed images and MIPs were obtained and reviewed to evaluate the vascular anatomy. CONTRAST:  134mL OMNIPAQUE IOHEXOL 350 MG/ML SOLN COMPARISON:  CT a chest 08/03/2021 for pulmonary embolus. FINDINGS: CTA CHEST FINDINGS Cardiovascular: Heart is enlarged with pericardial effusion, increased since 08/03/2021. Pericardial fluid measures 17 mm in thickness anterior to the interventricular septum. As noted on prior studies, ascending thoracic aortic aneurysm noted measuring up to 5.5 cm diameter. There is a dissection flap in the ascending aorta originating in the region of the coronary cusps. Both false and true lumen opacify in the ascending aorta and is difficult to establish which lumen represents the true lumen and which lumen represents the false lumen. Dissection flap extends into the brachiocephalic artery, left common carotid artery, and the left subclavian artery. The right subclavian and vertebral arteries show differential decreased perfusion. Dissection flap in the left subclavian artery extends to the origin of the left vertebral artery which does opacify. Small nipple like projection of contrast is seen between the ascending aorta and pulmonary trunk on axial 81/5, indeterminate but potentially related to the dissection flap/penetrating ulcer Mediastinum/Nodes: No mediastinal lymphadenopathy. There is no hilar lymphadenopathy. The esophagus has normal imaging features. There is no axillary lymphadenopathy. Lungs/Pleura: Basilar atelectasis noted in the lower lungs bilaterally. No pleural effusion. Musculoskeletal: No  worrisome lytic or sclerotic osseous abnormality. Review of the MIP images confirms the above findings. CTA ABDOMEN AND PELVIS FINDINGS VASCULAR  Aorta: As noted above, it is difficult to establish which lumen is the true lumen versus the false lumen. Celiac axis and SMA often arise from the true lumen and both of these branch vessels arise from the more anterior of the 2 lumen on today's study and are well opacified. IMA also arises from the anterior lumen and is well opacified. Left renal artery arises from the anterior lumen while the right renal artery arises posterior to the dissection flap from the right posterolateral lumen. The more posterior of the 2 lumen of becomes compressed in the mid and distal abdominal aorta with dissection flap extending into both common iliac arteries. Celiac: Widely patent arising from the more anterior of the 2 lumen, presumably the true lumen. SMA: Widely patent and well opacified proximally. The dissection flap involves the origin of the SMA and there is a suggestion that the dissection extends out into the SMA (see axial images 185-187 of series 5. There is abrupt non opacification of the SMA (197/5) suggesting occlusion secondary to the dissection. Renals: As above, the left renal artery arises from the more anterior of the 2 lumen a although the dissection flap does extend into the proximal left renal artery. Right renal artery arises from the more posterior of the 2 lumen a, potentially the false lumen. IMA: Widely patent arising from the more anterior lumen. Inflow: Dissection flap extends into both common iliac arteries with extension into the right common iliac artery bifurcation. Right external iliac artery (278/5) is likely dissected and and demonstrates abrupt occlusion at the level of the right common femoral artery (image 294/5). Right femoral artery stent device noted in the proximal thigh. There is evidence of dissection flap extension into the right internal iliac  artery with marked luminal narrowing proximally and differentially decreased perfusion in the right internal iliac artery. Dissection flap appears to terminate in the left common iliac artery with good opacification of the left external and internal iliac arteries. Veins: Not well evaluated given arterial bolus timing. Review of the MIP images confirms the above findings. NON-VASCULAR Hepatobiliary: Calcified granulomata noted in the liver. Good opacification of the left and right hepatic arteries. There is no evidence for gallstones, gallbladder wall thickening, or pericholecystic fluid. No intrahepatic or extrahepatic biliary dilation. Pancreas: No focal mass lesion. No dilatation of the main duct. No intraparenchymal cyst. No peripancreatic edema. Spleen: No splenomegaly. No focal mass lesion. Adrenals/Urinary Tract: Bilateral renal cysts. No hydroureter. Bladder is nondistended. Stomach/Bowel: Stomach is unremarkable. No gastric wall thickening. No evidence of outlet obstruction. Duodenum is normally positioned as is the ligament of Treitz. No small bowel wall thickening. No small bowel dilatation. Right groin hernia contains bowel loops without evidence of obstruction or bowel wall thickening. This appears to be small bowel. No gross colonic mass. Circumferential wall thickening noted in the rectum. Colonic wall thickening. Lymphatic: No abdominal lymphadenopathy.  No pelvic lymphadenopathy. Reproductive: The prostate gland and seminal vesicles are unremarkable. Other: No intraperitoneal free fluid. Musculoskeletal: No worrisome lytic or sclerotic osseous abnormality. Bilateral gynecomastia. Review of the MIP images confirms the above findings. IMPRESSION: 1. Type A aortic dissection originating in the region of the coronary cusps. Difficult to determine true from false lumen in the ascending aorta. No active extravasation in the mediastinum. Small nipple like projection of contrast between the ascending aorta  and pulmonary trunk, indeterminate but potentially related to the dissection flap/penetrating ulcer. 2. Dissection flap extends into the brachiocephalic artery, left common carotid artery, and left subclavian artery.  There is decreased perfusion to the right subclavian artery and right vertebral artery consistent with involvement of the origin by dissection flap. Dissection extends up the left subclavian artery to the origin of the vertebral artery although no differential perfusion evident. 3. The dissection flap involves the origin of the SMA and there is abrupt non opacification of the mid SMA suggesting occlusion secondary to the dissection. 4. Celiac axis and SMA arise from the presumed true lumen (based on location). Left renal artery arises from the presumed true lumen but is involved by the dissection flap. Right renal artery arises from the presumed false lumen posterior to the flap. IMA arises from the presumed true lumen and is widely patent. 5. Dissection flap extends into the right common iliac artery bifurcation. Right external iliac artery is likely dissected and there is abrupt occlusion at the level of the right common femoral artery. Right femoral artery stent device noted in the proximal thigh. Right internal iliac artery shows marked luminal narrowing proximally and differentially decreased perfusion in the right internal iliac artery. 6. Dissection flap in the left common iliac artery appears to terminate prior to the bifurcation and there is good perfusion to both the left internal and external iliac arteries. 7. Circumferential wall thickening in the rectum. Proctitis is a consideration. Neoplasm not excluded. 8. Right groin hernia contains bowel loops without complicating features. Initial results of aortic dissection or called to Dr. Pearline Cables at 1844 hours on Sep 17, 2021. Final critical Value/emergent results were called by telephone at the time of interpretation on 09/17/2021 at 7:13 pm to  provider Wynona Dove , who verbally acknowledged these results. Electronically Signed   By: Misty Stanley M.D.   On: 09-17-2021 19:18    Microbiology Recent Results (from the past 240 hour(s))  Resp Panel by RT-PCR (Flu A&B, Covid) Nasopharyngeal Swab     Status: Abnormal   Collection Time: 09-17-21  7:57 PM   Specimen: Nasopharyngeal Swab; Nasopharyngeal(NP) swabs in vial transport medium  Result Value Ref Range Status   SARS Coronavirus 2 by RT PCR POSITIVE (A) NEGATIVE Final    Comment: RESULT CALLED TO, READ BACK BY AND VERIFIED WITH: CHRISTINA PRICE RN 2021-09-17 @2139  BY JW  (NOTE) SARS-CoV-2 target nucleic acids are DETECTED.  The SARS-CoV-2 RNA is generally detectable in upper respiratory specimens during the acute phase of infection. Positive results are indicative of the presence of the identified virus, but do not rule out bacterial infection or co-infection with other pathogens not detected by the test. Clinical correlation with patient history and other diagnostic information is necessary to determine patient infection status. The expected result is Negative.  Fact Sheet for Patients: EntrepreneurPulse.com.au  Fact Sheet for Healthcare Providers: IncredibleEmployment.be  This test is not yet approved or cleared by the [place] FDA and  has been authorized for detection and/or diagnosis of SARS-CoV-2 by FDA under an Emergency Use Authorization (EUA).  This EUA will remain in effect (meaning this te st can be used) for the duration of  the COVID-19 declaration under Section 564(b)(1) of the Act, 21 U.S.C. section 360bbb-3(b)(1), unless the authorization is terminated or revoked sooner.     Influenza A by PCR NEGATIVE NEGATIVE Final   Influenza B by PCR NEGATIVE NEGATIVE Final    Comment: (NOTE) The Xpert Xpress SARS-CoV-2/FLU/RSV plus assay is intended as an aid in the diagnosis of influenza from Nasopharyngeal swab specimens  and should not be used as a sole basis for treatment. Nasal washings and aspirates are  unacceptable for Xpert Xpress SARS-CoV-2/FLU/RSV testing.  Fact Sheet for Patients: EntrepreneurPulse.com.au  Fact Sheet for Healthcare Providers: IncredibleEmployment.be  This test is not yet approved or cleared by the [place] FDA and has been authorized for detection and/or diagnosis of SARS-CoV-2 by FDA under an Emergency Use Authorization (EUA). This EUA will remain in effect (meaning this test can be used) for the duration of the COVID-19 declaration under Section 564(b)(1) of the Act, 21 U.S.C. section 360bbb-3(b)(1), unless the authorization is terminated or revoked.  Performed at [facility], [place] [address], [place], [place] [address]     Lab Basic Metabolic Panel: Recent Labs  Lab 24-Sep-2021 1710  NA 143  K 3.5  CL 108  CO2 25  GLUCOSE 151*  BUN 37*  CREATININE 1.58*  CALCIUM 8.3*   Liver Function Tests: Recent Labs  Lab 09-24-21 1710  AST 63*  ALT 63*  ALKPHOS 53  BILITOT 0.6  PROT 4.3*  ALBUMIN 1.9*   No results for input(s): LIPASE, AMYLASE in the last 168 hours. No results for input(s): AMMONIA in the last 168 hours. CBC: Recent Labs  Lab 09-24-21 1710  WBC 8.5  NEUTROABS 6.1  HGB 9.4*  HCT 29.5*  MCV 98.3  PLT 206   Cardiac Enzymes: No results for input(s): CKTOTAL, CKMB, CKMBINDEX, TROPONINI in the last 168 hours. Sepsis Labs: Recent Labs  Lab 24-Sep-2021 1710 2021-09-24 1800  WBC 8.5  --   LATICACIDVEN  --  4.3*    Procedures/Operations  Bilateral radial arterial lines-Dr. Ola Spurr Subxiphoid pericardial window-Dr. Julious Payer Sep 24, 2021, 9:53 PM

## 2021-09-11 NOTE — Anesthesia Procedure Notes (Signed)
Procedure Name: Intubation Date/Time: 09/06/2021 8:39 PM Performed by: Suzy Bouchard, CRNA Patient Re-evaluated:Patient Re-evaluated prior to induction Oxygen Delivery Method: Circle system utilized Preoxygenation: Pre-oxygenation with 100% oxygen Induction Type: IV induction Laryngoscope Size: Glidescope Grade View: Grade I Tube size: 8.0 mm Number of attempts: 1 Airway Equipment and Method: Stylet Secured at: 24 cm Tube secured with: Tape Dental Injury: Teeth and Oropharynx as per pre-operative assessment

## 2021-09-11 NOTE — Progress Notes (Signed)
      [address]       [place] [address]             [phone]      Samuel Butler arrived at the operating room awake and alert.  Dr. Ola Spurr and the anesthesia team placed bilateral radial arterial lines.  As they were preparing to induce anesthesia he became unresponsive and had a sudden PEA arrest.  ACLS was initiated.  Chest compressions were performed.  He was bradycardic mostly in the 50s but as low as the 20s.  He was unresponsive to epinephrine boluses and vasopressin boluses.  He did not yet have central access.  While CPR continued Dr. Ola Spurr placed a TEE probe.  There was no cardiac activity on either the left ventricle or the right ventricle with the exception of a very slight movement of the lateral wall.  There was a large pericardial effusion.  Although he had a large pericardial effusion it did not appear to be classic tamponade physiology as both the RV and LV were distended and not moving.  In any event it was felt that the only hope would be to drain the pericardium and see if there would be any cardiac recovery or response to medications.  With CPR ongoing the chest was prepped and draped.  An incision was made centered over the xiphoid process.  A subxiphoid pericardial window was created and there was a large amount of initially dark and then bright red blood.  During this the patient fibrillated.  There was still no evidence of cardiac activity.  He still did not respond to pressors.  After 45 minutes of resuscitation with no evidence of cardiac activity further resuscitation was felt to be futile.  Time of death 2113-02-06.  Samuel Standard Roxan Hockey, MD [facility] [phone]

## 2021-09-11 NOTE — ED Notes (Signed)
Activated MTP on pt

## 2021-09-11 NOTE — Brief Op Note (Signed)
09-27-21  9:57 PM  PATIENT:  Samuel Butler  60 y.o. male  PRE-OPERATIVE DIAGNOSIS:  type one dissection  POST-OPERATIVE DIAGNOSIS:  type one dissection  PROCEDURE:  Procedure(s): SUBXYPHOID PERICARDIAL WINDOW (N/A)  SURGEON:  Surgeon(s) and Role:    * Melrose Nakayama, MD - Primary  PHYSICIAN ASSISTANT:   ASSISTANTS: Erin Barrett, PA  ANESTHESIA:   general  EBL:  1 liter   BLOOD ADMINISTERED: Packed red blood cells  DRAINS: none   LOCAL MEDICATIONS USED:  NONE  SPECIMEN:  No Specimen  DISPOSITION OF SPECIMEN:  N/A  COUNTS:  YES  TOURNIQUET:  * No tourniquets in log *  DICTATION: .Other Dictation: Dictation Number -  PLAN OF CARE:  Patient expired  PATIENT DISPOSITION:   expired   Delay start of Pharmacological VTE agent (>24hrs) due to surgical blood loss or risk of bleeding: not applicable

## 2021-09-11 NOTE — Anesthesia Procedure Notes (Signed)
Arterial Line Insertion Start/End10/31/22 8:20 PM, 09/10/21 8:25 PM Performed by: Suzy Bouchard, CRNA, CRNA  Patient location: OR. Preanesthetic checklist: patient identified, IV checked, site marked, surgical consent, monitors and equipment checked, pre-op evaluation and timeout performed Emergency situation Right, radial was placed Catheter size: 20 G Hand hygiene performed  and maximum sterile barriers used   Attempts: 1 Procedure performed without using ultrasound guided technique. Following insertion, dressing applied and Biopatch. Post procedure assessment: normal  Patient tolerated the procedure well with no immediate complications.

## 2021-09-11 NOTE — Consult Note (Signed)
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  Followed in ID clinic for hepatitis B.  He has a history of 5.1 cm ascending aortic aneurysm.  He presented to the emergency department with chest pain, right leg and groin pain.  At presentation he was very anxious, had hypotension with SBP as low as in the 80s.  CT angiogram showed a type I aortic dissection with extension of false lumen into the brachiocephalic artery, left common carotid, left subclavian, down to the origin of the SMA and possibly involving the left renal artery.  Decreased perfusion noted to the right subclavian right vertebral artery.    Pertinent  Medical History   Past Medical History:  Diagnosis Date   Cancer [facility])    DDD (degenerative disc disease), lumbar    per his report   Diabetes mellitus without complication ([place])    Gout 04/16/2013   Hyperlipidemia    Hypertension    IBD (inflammatory bowel disease) 04/05/2016   Pulmonary embolism (H[facility])    Tobacco use disorder 03/28/2015     Significant Hospital Events: Including procedures, antibiotic start and stop dates in addition to other pertinent events   CT angiogram 10/25 >> type I aortic dissection with extension of false lumen into the brachiocephalic artery, left common carotid, left subclavian, down to the origin of the SMA and possibly involving the left renal artery.  Decreased perfusion noted to the right subclavian right vertebral artery.   Interim History / Subjective:  Chest discomfort persists.  His groin and right lower extremity pain have improved.  He is  received 1 unit packed red blood cells, massive transfusion protocol was initiated but not clear that this is necessary.  Reversal of his anticoagulation initiated..  Blood pressure has rebounded, now 145/90.  Objective   Blood pressure (!) 152/88, pulse 95, temperature 99 F (37.2 C), temperature source Oral, resp. rate (!) 27, height 5\' 11"  (1.803 m), weight 78 kg, SpO2 95 %.        Intake/Output Summary (Last 24 hours) at 08-Sep-2021 2123 Last data filed at Sep 08, 2021 2113 Gross per 24 hour  Intake 2150 ml  Output --  Net 2150 ml   Filed Weights   2021/09/08 1659  Weight: 78 kg    Examination: General: Acute and chronic ill-appearing man, laying comfortably in bed HENT: OP clear, no stridor Lungs: clear B Cardiovascular: regular, no M Abdomen: slightly distended, no tenderness. +BS Extremities: 1+ LE edema Neuro: awake, alert, appropriate, good strength.   Resolved Hospital Problem list     Assessment & Plan:   Type I aortic dissection with significant extension.  -initial plan was to admit to ICU for stabilization and tight BP control, reversal anticoagulation.  He has remained labile, has persistent chest and LE pain. He will go to the OR urgently tonight -BP has increased compared with presentation. Esmolol gtt initiated in the ED.  -anticoagulation on hold -will be critically ill when he comes to ICU post-op  Anticipate Acute respiratory  failure and MV post-op -will assist w MV management, plan for [facility] 8cc/kg as long as not evolving infiltrates on CXR  post-procedure.  -sedation per PAD protocol  Hemodynamic instability w hypotension / hypertension -at most risk for shock post-op. If he does have hypertension then will manage w esmolol or nicardipine for goal SBP 100.   CKD stage 3b, at high risk for acute renal failure -follow UOP, BMP post-op  Diabetes -plan for SSI coverage post-op  Light chain amyloidosis, treated w chemo, completed 11 cycles 07/2021, now  on monthly rx -will need to account for cardiac, renal, suspected GI effects.   Chronic Hep B -has been maintained on Vemlidy as an outpt while on immunosuppression  Best Practice (right click and "Reselect all SmartList Selections" daily)   Diet/type: NPO DVT prophylaxis: SCD GI prophylaxis: PPI Lines: Central line Foley:  Yes, and it is still needed Code Status:  full code Last date of multidisciplinary goals of care discussion [discussed plans and goals with the pt and his wife in the ED Sep 10, 2021]  Labs   CBC: Recent Labs  Lab Sep 10, 2021 1710  WBC 8.5  NEUTROABS 6.1  HGB 9.4*  HCT 29.5*  MCV 98.3  PLT 354    Basic Metabolic Panel: Recent Labs  Lab September 10, 2021 1710  NA 143  K 3.5  CL 108  CO2 25  GLUCOSE 151*  BUN 37*  CREATININE 1.58*  CALCIUM 8.3*   GFR: Estimated Creatinine Clearance: 52.3 mL/min (A) (by C-G formula based on SCr of 1.58 mg/dL (H)). Recent Labs  Lab Sep 10, 2021 1710 09-10-2021 1800  WBC 8.5  --   LATICACIDVEN  --  4.3*    Liver Function Tests: Recent Labs  Lab 10-Sep-2021 1710  AST 63*  ALT 63*  ALKPHOS 53  BILITOT 0.6  PROT 4.3*  ALBUMIN 1.9*   No results for input(s): LIPASE, AMYLASE in the last 168 hours. No results for input(s): AMMONIA in the last 168 hours.  ABG No results found for: PHART, PCO2ART, PO2ART, HCO3, TCO2, ACIDBASEDEF, O2SAT   Coagulation Profile: Recent Labs  Lab 2021/09/10 1710  INR 1.1    Cardiac Enzymes: No results for input(s): CKTOTAL, CKMB, CKMBINDEX, TROPONINI in the last 168 hours.  HbA1C: Hemoglobin A1C  Date/Time Value Ref Range Status  12/26/2020 02:44 PM 5.8 (A) 4.0 - 5.6 % Final  08/31/2020 07:39 AM 6.4 (A) 4.0 - 5.6 % Final   Hgb A1c MFr Bld  Date/Time Value Ref Range Status  12/23/2019 07:52 AM 6.7 (H) 4.6 - 6.5 % Final    Comment:    Glycemic Control Guidelines for People with Diabetes:Non Diabetic:  <6%Goal of Therapy: <7%Additional Action Suggested:  >8%   12/03/2018 09:48 AM 6.1 4.6  - 6.5 % Final    Comment:    Glycemic Control Guidelines for People with Diabetes:Non Diabetic:  <6%Goal of Therapy: <7%Additional Action Suggested:  >8%     CBG: No results for input(s): GLUCAP in the last 168 hours.  Review of Systems:   As per HPI  Past Medical History:  He,  has a past medical history of Cancer ([place]), DDD (degenerative disc disease), lumbar, Diabetes mellitus without complication ([place]), Gout (04/16/2013), Hyperlipidemia, Hypertension, IBD (inflammatory bowel disease) (04/05/2016), Pulmonary embolism ([place]), and Tobacco use disorder (03/28/2015).   Surgical History:   Past Surgical History:  Procedure Laterality Date   ABDOMINAL AORTOGRAM W/LOWER EXTREMITY Right 06/26/2020   Procedure: ABDOMINAL AORTOGRAM W/LOWER EXTREMITY;  Surgeon: Lorretta Harp, MD;  Location: [place] [facility];  Service: Cardiovascular;  Laterality: Right;   COLONOSCOPY     PERIPHERAL VASCULAR INTERVENTION  06/26/2020  Procedure: PERIPHERAL VASCULAR INTERVENTION;  Surgeon: Lorretta Harp, MD;  Location: [place] [facility];  Service: Cardiovascular;;  Right SFA   TONSILLECTOMY       Social History:   reports that he quit smoking about 4 years ago. His smoking use included cigarettes. He has never used smokeless tobacco. He reports that he does not currently use alcohol. He reports that he does not currently use drugs.   Family History:  His family history includes Colon cancer in his brother; Diabetes in his father; Hypertension in his father; Pancreatic cancer in his father; Stroke in his father. There is no history of Esophageal cancer or Stomach cancer.   Allergies Allergies  Allergen Reactions   Crestor [Rosuvastatin Calcium] Other (See Comments)    Joint aches     Home Medications  Prior to Admission medications   Medication Sig Start Date End Date Taking? Authorizing Provider  acetaminophen (TYLENOL) 650 MG CR tablet Take 1,300 mg by mouth every 8 (eight) hours as needed  for pain.     [provider]  acyclovir (ZOVIRAX) 400 MG tablet Take 1 tablet by mouth twice daily 09/03/21   Orson Slick, MD  albuterol (VENTOLIN HFA) 108 (90 Base) MCG/ACT inhaler Inhale 2 puffs into the lungs every 6 (six) hours as needed for wheezing or shortness of breath. 09/28/20   Orson Slick, MD  allopurinol (ZYLOPRIM) 100 MG tablet Take 100 mg by mouth daily. Patient not taking: Reported on 08/31/2021 08/26/21   [provider]  atorvastatin (LIPITOR) 80 MG tablet Take 1 tablet by mouth once daily 06/13/21   Isaac Bliss, Rayford Halsted, MD  cilostazol (PLETAL) 50 MG tablet Take 1 tablet (50 mg total) by mouth 2 (two) times daily. 07/30/21   Lorretta Harp, MD  clopidogrel (PLAVIX) 75 MG tablet Take 1 tablet by mouth once daily with breakfast 07/18/21   Lorretta Harp, MD  dicyclomine (BENTYL) 20 MG tablet TAKE 1 TABLET BY MOUTH 4 TIMES DAILY 20  TO  30  MINUTES  BEFORE  MEALS  AND  AT  BEDTIME 08/20/21   Lemmon, Lavone Nian, PA  ELIQUIS 5 MG TABS tablet TAKE 1 TABLET BY MOUTH TWICE DAILY . APPOINTMENT REQUIRED FOR FUTURE REFILLS 09/03/21   Isaac Bliss, Rayford Halsted, MD  Evolocumab (REPATHA SURECLICK) 287 MG/ML SOAJ Inject 140 mg into the skin every 14 (fourteen) days. 12/28/20   Lorretta Harp, MD  ezetimibe (ZETIA) 10 MG tablet Take 1 tablet by mouth once daily 08/20/21   Lelon Perla, MD  glycopyrrolate (ROBINUL) 2 MG tablet Take 1 tablet (2 mg total) by mouth 2 (two) times daily before a meal. Before breakfast and dinner 08/15/21   Ladene Artist, MD  HYDROcodone-acetaminophen (NORCO) 7.5-325 MG tablet hydrocodone 7.5 mg-acetaminophen 325 mg tablet  TAKE 1 TABLET BY MOUTH EVERY 6 HOURS AS NEEDED FOR MODERATE PAIN FOR UP TO 5 DAYS    [provider]  hydrocortisone (ANUSOL-HC) 25 MG suppository Place 1 suppository (25 mg total) rectally at bedtime. 08/15/21   Ladene Artist, MD  isosorbide mononitrate (IMDUR) 30 MG 24 hr tablet Take 1  tablet by mouth once daily 01/02/21   Isaac Bliss, Rayford Halsted, MD  loperamide (IMODIUM) 1 MG/5ML solution Take 2 mg by mouth as needed for diarrhea or loose stools.    [provider]  losartan (COZAAR) 50 MG tablet Take 1 tablet by mouth once daily 08/22/21   Lorretta Harp,  MD  metFORMIN (GLUCOPHAGE) 500 MG tablet Take 1 tablet by mouth once daily with breakfast 03/12/21   Isaac Bliss, Rayford Halsted, MD  ondansetron (ZOFRAN) 8 MG tablet TAKE 1 TABLET BY MOUTH EVERY 8 HOURS AS NEEDED FOR NAUSEA OR VOMITING 10/27/20   Orson Slick, MD  pantoprazole (PROTONIX) 40 MG tablet Take 1 tablet (40 mg total) by mouth daily. 01/18/21   Isaac Bliss, Rayford Halsted, MD  potassium chloride SA (KLOR-CON) 20 MEQ tablet Take 1 tablet by mouth twice daily 08/22/21   Orson Slick, MD  torsemide (DEMADEX) 20 MG tablet Take 1 tablet (20 mg total) by mouth 2 (two) times daily. 40mg  BID x 3 days then back to 20mg  BID 11/29/20   Cleaver, [name], NP  VEMLIDY 25 MG TABS Take 1 tablet (25 mg total) by mouth daily. 05/29/21   Mignon Pine, DO  Vitamin D, Ergocalciferol, (DRISDOL) 1.25 MG (50000 UNIT) CAPS capsule Take 1 capsule by mouth once a week 12/05/20   Isaac Bliss, Rayford Halsted, MD     Critical care time: NA     Baltazar Apo, MD, PhD Oct 04, 2021, 9:49 PM [facility] [phone] or if no answer before 7:00PM call [phone] For any issues after 7:00PM please call eLink [phone]

## 2021-09-11 NOTE — ED Provider Notes (Addendum)
Emergency Medicine Provider Triage Evaluation Note  Samuel Butler , a 60 y.o. male  was evaluated in triage.  Pt complains of multiple complaints.  Called by EMS he states for chest pain and shortness of breath.  Was found to be having "panic attack", only in his underwear in his home.  Called to the bedside in triage due to patient's hypotension.  BP 84/66.Marland Kitchen  Review of Systems  Positive: Chest pain, shortness of breath, transiently blurry vision, pain in his right thigh Negative: Fevers, chills  Physical Exam  BP (!) 92/58   Pulse 71   Temp 99 F (37.2 C) (Oral)   Resp 18   Ht 5\' 11"  (1.803 m)   Wt 78 kg   BMI 23.99 kg/m  Gen:   Awake, tremulous, keeps saying that he feels poorly Resp:  Normal effort tachypneic MSK:   Moves extremities without difficulty  Other:  RRR no M/R/G.  Medical Decision Making  Medically screening exam initiated at 5:19 PM.  Appropriate orders placed.  Carvel A Lavalais was informed that the remainder of the evaluation will be completed by another provider, this initial triage assessment does not replace that evaluation, and the importance of remaining in the ED until their evaluation is complete.  Patient is hypotensive to the 80s.  Fluid bolus started, charge made aware of patient's hypotension and need for room emergently. Patient transferred to room 22.  This chart was dictated using voice recognition software, Dragon. Despite the best efforts of this provider to proofread and correct errors, errors may still occur which can change documentation meaning.    Emeline Darling, PA-C 09-29-21 1721    Aslyn Cottman, Gypsy Balsam, PA-C 09-29-21 1856    Charlesetta Shanks, MD 09/05/21 1338

## 2021-09-11 NOTE — ED Triage Notes (Signed)
Initially told by EMS that pt was here for R groin pain.  When pt arrived he was in his underwear and had goose bumps and stated he was having a panic attack. After sitting a while at the nurses station pt stated chest pain.  EKG completed.  Pt then vomited. Pressures dropped from 91/62 to 84/66.  Pt state he feels as if he's going to pass out.

## 2021-09-11 NOTE — Op Note (Signed)
NAME: Samuel Butler, Samuel Butler RECORD NO: [id number] ACCOUNT NO: [id number] DATE OF BIRTH: Feb 23, 1961 FACILITY: [facility] LOCATION: [facility] PHYSICIAN: Revonda Standard. Roxan Hockey, MD  Operative Report   DATE OF PROCEDURE: September 22, 2021  PREOPERATIVE DIAGNOSIS:  Type 1 aortic dissection.  POSTOPERATIVE DIAGNOSIS:  Type 1 aortic dissection.  PROCEDURE:  Subxiphoid pericardial window.  SURGEON:  Revonda Standard. Roxan Hockey, MD  ASSISTANT:  Ellwood Handler, PA  ANESTHESIA:  General.  FINDINGS:  Approximately 1 liter of blood in pericardium. No return of cardiac activity with drainage of pericardial effusion.  CLINICAL NOTE:  [name] is a 60 year old gentleman with numerous medical problems who presented with chest and right leg pain.  He was found to have a type 1 aortic dissection.  He was chronically on Eliquis and Plavix.  Despite the high risk for  bleeding complications, he was having ongoing chest pain and was advised to undergo surgical repair of the dissection.  The indications, risks, benefits, and alternatives were discussed in detail with the patient.  He accepts the risks and agreed to  proceed.  OPERATIVE NOTE:  [name] was brought to the operating room on Sep 22, 2021.  He was brought directly from the ED to the OR emergently.  He was conscious on arrival with stable vital signs.  Dr. Suzette Battiest and the anesthesia team placed bilateral  radial arterial lines.  As they were preparing to induce anesthesia, the patient had sudden loss of consciousness and total loss of pulse.  There was profound bradycardia and then persistent bradycardia, but no pulse.  CPR was initiated.  The patient  was also given intravenous epinephrine, vasopressin and bicarbonate. As CPR continued, Dr. Ola Spurr placed a TEE probe, which showed no significant cardiac activity in the LV or RV, both were distended.  There was a large pericardial  effusion.  Decision was made to attempt to a subxiphoid pericardial window  to see if there would be any return of cardiac function prior to attempting to repair the dissection. As CPR continued, the chest was prepped and draped in a sterile fashion as  possible.  A timeout was performed.  An incision was made centered over the xiphoid, was carried through the skin and subcutaneous tissue.  The xiphoid process was elevated.  The muscular attachments were dissected off. The pericardium was incised and a  large amount of dark blood was evacuated under pressure.  There was no change in hemodynamics and no return of pulse.  A sucker was placed into the pericardial space and approximately a liter of initially dark blood and then bright red blood was  evacuated. Despite resolution of the effusion on the echocardiogram, there was no return of cardiac activity. After 45 minutes of resuscitation, it was not felt that there was any hope for meaningful survival and further resuscitation attempts would be  futile. Resuscitation was stopped and the patient expired at 21:14.  The wound was closed in 2 layers.  A dressing was applied.  The family was notified.   VAI D: 2021-09-22 10:04:18 pm T: 09/05/2021 12:51:00 am  JOB: [id number]/ [id number]

## 2021-09-11 NOTE — H&P (Signed)
Samuel Butler is an 60 y.o. male.   Chief Complaint: CP HPI: 60 yo man with history of hypertension, hyperlipidemia, diabetes, amyloidosis, stage IIIB CKD, tobacco abuse, PE, PAD, gout, hepatitis B and a 5.1 cm ascending aneurysm.  About 2 PM abrupt onset of Cp and right leg pain.  Called EMS and was brought to ED. Initially hypotensive and very anxious. CT angio showed type I aortic dissection,  Is on Eliquis and Plavix chronically. Received K [facility]ra in ED.   When I arrived was receiving massive transfusion protocol and was hypertensive in 150s. Transfusions stopped. Treated with IV labetalol + esmolol drip. Right leg pain better but Cp persisted.  Past Medical History:  Diagnosis Date   Cancer [facility])    DDD (degenerative disc disease), lumbar    per his report   Diabetes mellitus without complication ([place])    Gout 04/16/2013   Hyperlipidemia    Hypertension    IBD (inflammatory bowel disease) 04/05/2016   Pulmonary embolism ([place])    Tobacco use disorder 03/28/2015    Past Surgical History:  Procedure Laterality Date   ABDOMINAL AORTOGRAM W/LOWER EXTREMITY Right 06/26/2020   Procedure: ABDOMINAL AORTOGRAM W/LOWER EXTREMITY;  Surgeon: Lorretta Harp, MD;  Location: [facility];  Service: Cardiovascular;  Laterality: Right;   COLONOSCOPY     PERIPHERAL VASCULAR INTERVENTION  06/26/2020   Procedure: PERIPHERAL VASCULAR INTERVENTION;  Surgeon: Lorretta Harp, MD;  Location: [facility];  Service: Cardiovascular;;  Right SFA   TONSILLECTOMY      Family History  Problem Relation Age of Onset   Hypertension Father    Diabetes Father    Stroke Father    Pancreatic cancer Father    Colon cancer Brother    Esophageal cancer Neg Hx    Stomach cancer Neg Hx    Social History:  reports that he quit smoking about 4 years ago. His smoking use included cigarettes. He has never used smokeless tobacco. He reports that he does not currently use alcohol. He reports that he  does not currently use drugs.  Allergies:  Allergies  Allergen Reactions   Crestor [Rosuvastatin Calcium] Other (See Comments)    Joint aches    (Not in a hospital admission)   Results for orders placed or performed during the hospital encounter of 2021-09-10 (from the past 48 hour(s))  Troponin I (High Sensitivity)     Status: Abnormal   Collection Time: 2021/09/10  5:10 PM  Result Value Ref Range   Troponin I (High Sensitivity) 63 (H) <18 ng/L    Comment: (NOTE) Elevated high sensitivity troponin I (hsTnI) values and significant  changes across serial measurements may suggest ACS but many other  chronic and acute conditions are known to elevate hsTnI results.  Refer to the "Links" section for chest pain algorithms and additional  guidance. Performed at [facility], [place] [address], [place], [place] [address]   Comprehensive metabolic panel     Status: Abnormal   Collection Time: 2021-09-10  5:10 PM  Result Value Ref Range   Sodium 143 135 - 145 mmol/L   Potassium 3.5 3.5 - 5.1 mmol/L   Chloride 108 98 - 111 mmol/L   CO2 25 22 - 32 mmol/L   Glucose, Bld 151 (H) 70 - 99 mg/dL    Comment: Glucose reference range applies only to samples taken after fasting for at least 8 hours.   BUN 37 (H) 8 - 23 mg/dL   Creatinine,  Ser 1.58 (H) 0.61 - 1.24 mg/dL   Calcium 8.3 (L) 8.9 - 10.3 mg/dL   Total Protein 4.3 (L) 6.5 - 8.1 g/dL   Albumin 1.9 (L) 3.5 - 5.0 g/dL   AST 63 (H) 15 - 41 U/L   ALT 63 (H) 0 - 44 U/L   Alkaline Phosphatase 53 38 - 126 U/L   Total Bilirubin 0.6 0.3 - 1.2 mg/dL   GFR, Estimated 49 (L) >60 mL/min    Comment: (NOTE) Calculated using the CKD-EPI Creatinine Equation (2021)    Anion gap 10 5 - 15    Comment: Performed at [facility], [place] [address], [place], [place] [address]  CBC WITH DIFFERENTIAL     Status: Abnormal   Collection Time: 2021-10-01  5:10 PM  Result Value Ref Range   WBC 8.5 4.0 - 10.5 K/uL   RBC 3.00 (L) 4.22 - 5.81 MIL/uL    Hemoglobin 9.4 (L) 13.0 - 17.0 g/dL   HCT 29.5 (L) 39.0 - 52.0 %   MCV 98.3 80.0 - 100.0 fL   MCH 31.3 26.0 - 34.0 pg   MCHC 31.9 30.0 - 36.0 g/dL   RDW 15.3 11.5 - 15.5 %   Platelets 206 150 - 400 K/uL   nRBC 0.0 0.0 - 0.2 %   Neutrophils Relative % 71 %   Neutro Abs 6.1 1.7 - 7.7 K/uL   Lymphocytes Relative 18 %   Lymphs Abs 1.5 0.7 - 4.0 K/uL   Monocytes Relative 9 %   Monocytes Absolute 0.7 0.1 - 1.0 K/uL   Eosinophils Relative 2 %   Eosinophils Absolute 0.2 0.0 - 0.5 K/uL   Basophils Relative 0 %   Basophils Absolute 0.0 0.0 - 0.1 K/uL   Immature Granulocytes 0 %   Abs Immature Granulocytes 0.03 0.00 - 0.07 K/uL    Comment: Performed at [facility] [address] [place], [place] [address]  Protime-INR     Status: None   Collection Time: 10/01/2021  5:10 PM  Result Value Ref Range   Prothrombin Time 14.3 11.4 - 15.2 seconds   INR 1.1 0.8 - 1.2    Comment: (NOTE) INR goal varies based on device and disease states. Performed at [facility], [place] [address], [place], [place] [address]   APTT     Status: None   Collection Time: 10/01/21  5:10 PM  Result Value Ref Range   aPTT 24 24 - 36 seconds    Comment: Performed at [facility] [address], [place], [place] [address]  Brain natriuretic peptide     Status: Abnormal   Collection Time: 2021/10/01  5:11 PM  Result Value Ref Range   B Natriuretic Peptide 399.4 (H) 0.0 - 100.0 pg/mL    Comment: Performed at [facility] [address] [place], [place] [address]  Lactic acid, plasma     Status: Abnormal   Collection Time: 01-Oct-2021  6:00 PM  Result Value Ref Range   Lactic Acid, Venous 4.3 (HH) 0.5 - 1.9 mmol/L    Comment: CRITICAL RESULT CALLED TO, READ BACK BY AND VERIFIED WITH:  Dorinda Hill, RN, 1947, 01-Oct-2021, E. ADEDOKUN. Performed at [facility], [place] [address], [place],  [address]   Type and screen [place]     Status: None (Preliminary result)    Collection Time: 2021-10-01  6:44 PM  Result Value Ref Range   ABO/RH(D) A POS  Antibody Screen PENDING    Sample Expiration      09/07/2021,2359 Performed at [facility], [place] [address], [place], [place] [address]    Unit Number [id number]    Blood Component Type RED CELLS,LR    Unit division 00    Status of Unit ISSUED    Unit tag comment EMERGENCY RELEASE    Transfusion Status OK TO TRANSFUSE    Crossmatch Result PENDING    Unit Number [id number]    Blood Component Type RED CELLS,LR    Unit division 00    Status of Unit ISSUED    Unit tag comment EMERGENCY RELEASE    Transfusion Status OK TO TRANSFUSE    Crossmatch Result PENDING    Unit Number [id number]    Blood Component Type RED CELLS,LR    Unit division 00    Status of Unit ISSUED    Unit tag comment EMERGENCY RELEASE    Transfusion Status OK TO TRANSFUSE    Crossmatch Result PENDING    Unit Number [id number]    Blood Component Type RBC LR PHER2    Unit division 00    Status of Unit ISSUED    Unit tag comment EMERGENCY RELEASE    Transfusion Status OK TO TRANSFUSE    Crossmatch Result PENDING    Unit Number [id number]    Blood Component Type RBC LR PHER2    Unit division 00    Status of Unit ISSUED    Unit tag comment EMERGENCY RELEASE    Transfusion Status OK TO TRANSFUSE    Crossmatch Result PENDING    Unit Number [id number]    Blood Component Type RBC LR PHER1    Unit division 00    Status of Unit ISSUED    Unit tag comment EMERGENCY RELEASE    Transfusion Status OK TO TRANSFUSE    Crossmatch Result PENDING    Unit Number [id number]    Blood Component Type RED CELLS,LR    Unit division 00    Status of Unit ISSUED    Unit tag comment EMERGENCY RELEASE    Transfusion Status OK TO TRANSFUSE    Crossmatch Result PENDING    Unit Number [id number]    Blood Component Type RED CELLS,LR    Unit division 00    Status of Unit ISSUED    Unit tag comment EMERGENCY RELEASE     Transfusion Status OK TO TRANSFUSE    Crossmatch Result PENDING    Unit Number [id number]    Blood Component Type RED CELLS,LR    Unit division 00    Status of Unit ALLOCATED    Unit tag comment EMERGENCY RELEASE    Transfusion Status OK TO TRANSFUSE    Crossmatch Result PENDING    Unit Number [id number]    Blood Component Type RED CELLS,LR    Unit division 00    Status of Unit ALLOCATED    Unit tag comment EMERGENCY RELEASE    Transfusion Status OK TO TRANSFUSE    Crossmatch Result PENDING    Unit Number [id number]    Blood Component Type RED CELLS,LR    Unit division 00    Status of Unit ALLOCATED    Unit tag comment EMERGENCY RELEASE    Transfusion Status OK TO TRANSFUSE    Crossmatch Result PENDING    Unit Number [id number]    Blood Component Type RED CELLS,LR    Unit division 00    Status of Unit ALLOCATED  Unit tag comment EMERGENCY RELEASE    Transfusion Status OK TO TRANSFUSE    Crossmatch Result PENDING    Unit Number [id number]    Blood Component Type RED CELLS,LR    Unit division 00    Status of Unit ALLOCATED    Unit tag comment EMERGENCY RELEASE    Transfusion Status OK TO TRANSFUSE    Crossmatch Result PENDING    Unit Number [id number]    Blood Component Type RED CELLS,LR    Unit division 00    Status of Unit ALLOCATED    Unit tag comment EMERGENCY RELEASE    Transfusion Status OK TO TRANSFUSE    Crossmatch Result PENDING    Unit Number [id number]    Blood Component Type RED CELLS,LR    Unit division 00    Status of Unit ALLOCATED    Unit tag comment EMERGENCY RELEASE    Transfusion Status OK TO TRANSFUSE    Crossmatch Result PENDING    Unit Number [id number]    Blood Component Type RED CELLS,LR    Unit division 00    Status of Unit ALLOCATED    Unit tag comment EMERGENCY RELEASE    Transfusion Status OK TO TRANSFUSE    Crossmatch Result PENDING   Prepare fresh frozen plasma     Status: None (Preliminary result)    Collection Time: September 10, 2021  6:55 PM  Result Value Ref Range   Unit Number [id number]    Blood Component Type LIQ PLASMA    Unit division 00    Status of Unit ISSUED    Unit tag comment EMERGENCY RELEASE    Transfusion Status OK TO TRANSFUSE    Unit Number [id number]    Blood Component Type LIQ PLASMA    Unit division 00    Status of Unit ISSUED    Unit tag comment EMERGENCY RELEASE    Transfusion Status OK TO TRANSFUSE    Unit Number [id number]    Blood Component Type LIQ PLASMA    Unit division 00    Status of Unit ISSUED    Unit tag comment EMERGENCY RELEASE    Transfusion Status OK TO TRANSFUSE    Unit Number [id number]    Blood Component Type LIQ PLASMA    Unit division 00    Status of Unit ISSUED    Unit tag comment EMERGENCY RELEASE    Transfusion Status OK TO TRANSFUSE    Unit Number [id number]    Blood Component Type LIQ PLASMA    Unit division 00    Status of Unit ISSUED    Unit tag comment EMERGENCY RELEASE    Transfusion Status OK TO TRANSFUSE    Unit Number [id number]    Blood Component Type LIQ PLASMA    Unit division 00    Status of Unit REL FROM [facility]    Unit tag comment EMERGENCY RELEASE    Transfusion Status OK TO TRANSFUSE    Unit Number [id number]    Blood Component Type LIQ PLASMA    Unit division 00    Status of Unit ISSUED    Unit tag comment EMERGENCY RELEASE    Transfusion Status OK TO TRANSFUSE    Unit Number [id number]    Blood Component Type THAWED PLASMA    Unit division 00    Status of Unit REL FROM [facility]    Unit tag comment EMERGENCY RELEASE    Transfusion Status OK TO TRANSFUSE    Unit Number [id number]    Blood  Component Type THAWED PLASMA    Unit division 00    Status of Unit REL FROM [facility]    Transfusion Status OK TO TRANSFUSE    Unit Number [id number]    Blood Component Type THW PLS APHR    Unit division 00    Status of Unit REL FROM [facility]    Transfusion Status OK TO TRANSFUSE    Unit Number  [id number]    Blood Component Type THW PLS APHR    Unit division A0    Status of Unit REL FROM [facility]    Transfusion Status OK TO TRANSFUSE    Unit Number [id number]    Blood Component Type THW PLS APHR    Unit division 00    Status of Unit REL FROM [facility]    Transfusion Status OK TO TRANSFUSE    Unit Number [id number]    Blood Component Type THW PLS APHR    Unit division A0    Status of Unit REL FROM [facility]    Transfusion Status OK TO TRANSFUSE   Prepare platelet pheresis     Status: None (Preliminary result)   Collection Time: 09-23-2021  7:01 PM  Result Value Ref Range   Unit Number [id number]    Blood Component Type PLTP2 PSORALEN TREATED    Unit division 00    Status of Unit ISSUED    Unit tag comment EMERGENCY RELEASE    Transfusion Status OK TO TRANSFUSE    Unit Number [id number]    Blood Component Type PLTP1 PSORALEN TREATED    Unit division 00    Status of Unit ALLOCATED    Transfusion Status      OK TO TRANSFUSE Performed at [facility], [address] [place], [place] [address]   Prepare cryoprecipitate     Status: None (Preliminary result)   Collection Time: 23-Sep-2021  7:33 PM  Result Value Ref Range   Unit Number [id number]    Blood Component Type CRYPOOL THAW    Unit division 00    Status of Unit ALLOCATED    Transfusion Status OK TO TRANSFUSE    DG Chest 1 View  Result Date: 2021/09/23 CLINICAL DATA:  Chest pain and shortness of breath. EXAM: CHEST  1 VIEW COMPARISON:  August 09, 2021 FINDINGS: The lungs are hyperinflated. Mild bibasilar atelectasis is noted. There is no evidence of a pleural effusion or pneumothorax. The heart size and mediastinal contours are within normal limits. Degenerative changes seen throughout the thoracic spine. IMPRESSION: Mild bibasilar atelectasis. Electronically Signed   By: Virgina Norfolk M.D.   On: 09/23/21 19:12   CT Angio Chest/Abd/Pel for Dissection W and/or Wo Contrast  Result Date:  September 23, 2021 CLINICAL DATA:  Chest pain and hypotension. EXAM: CT ANGIOGRAPHY CHEST, ABDOMEN AND PELVIS TECHNIQUE: Pre contrast imaging of the chest was performed. Multidetector CT imaging through the chest, abdomen and pelvis was performed using the standard protocol during bolus administration of intravenous contrast. Multiplanar reconstructed images and MIPs were obtained and reviewed to evaluate the vascular anatomy. CONTRAST:  163mL OMNIPAQUE IOHEXOL 350 MG/ML SOLN COMPARISON:  CT a chest 08/03/2021 for pulmonary embolus. FINDINGS: CTA CHEST FINDINGS Cardiovascular: Heart is enlarged with pericardial effusion, increased since 08/03/2021. Pericardial fluid measures 17 mm in thickness anterior to the interventricular septum. As noted on prior studies, ascending thoracic aortic aneurysm noted measuring up to 5.5 cm diameter. There is a dissection flap in the ascending aorta originating in the region of the coronary cusps. Both false and  true lumen opacify in the ascending aorta and is difficult to establish which lumen represents the true lumen and which lumen represents the false lumen. Dissection flap extends into the brachiocephalic artery, left common carotid artery, and the left subclavian artery. The right subclavian and vertebral arteries show differential decreased perfusion. Dissection flap in the left subclavian artery extends to the origin of the left vertebral artery which does opacify. Small nipple like projection of contrast is seen between the ascending aorta and pulmonary trunk on axial 81/5, indeterminate but potentially related to the dissection flap/penetrating ulcer Mediastinum/Nodes: No mediastinal lymphadenopathy. There is no hilar lymphadenopathy. The esophagus has normal imaging features. There is no axillary lymphadenopathy. Lungs/Pleura: Basilar atelectasis noted in the lower lungs bilaterally. No pleural effusion. Musculoskeletal: No worrisome lytic or sclerotic osseous abnormality.  Review of the MIP images confirms the above findings. CTA ABDOMEN AND PELVIS FINDINGS VASCULAR Aorta: As noted above, it is difficult to establish which lumen is the true lumen versus the false lumen. Celiac axis and SMA often arise from the true lumen and both of these branch vessels arise from the more anterior of the 2 lumen on today's study and are well opacified. IMA also arises from the anterior lumen and is well opacified. Left renal artery arises from the anterior lumen while the right renal artery arises posterior to the dissection flap from the right posterolateral lumen. The more posterior of the 2 lumen of becomes compressed in the mid and distal abdominal aorta with dissection flap extending into both common iliac arteries. Celiac: Widely patent arising from the more anterior of the 2 lumen, presumably the true lumen. SMA: Widely patent and well opacified proximally. The dissection flap involves the origin of the SMA and there is a suggestion that the dissection extends out into the SMA (see axial images 185-187 of series 5. There is abrupt non opacification of the SMA (197/5) suggesting occlusion secondary to the dissection. Renals: As above, the left renal artery arises from the more anterior of the 2 lumen a although the dissection flap does extend into the proximal left renal artery. Right renal artery arises from the more posterior of the 2 lumen a, potentially the false lumen. IMA: Widely patent arising from the more anterior lumen. Inflow: Dissection flap extends into both common iliac arteries with extension into the right common iliac artery bifurcation. Right external iliac artery (278/5) is likely dissected and and demonstrates abrupt occlusion at the level of the right common femoral artery (image 294/5). Right femoral artery stent device noted in the proximal thigh. There is evidence of dissection flap extension into the right internal iliac artery with marked luminal narrowing proximally  and differentially decreased perfusion in the right internal iliac artery. Dissection flap appears to terminate in the left common iliac artery with good opacification of the left external and internal iliac arteries. Veins: Not well evaluated given arterial bolus timing. Review of the MIP images confirms the above findings. NON-VASCULAR Hepatobiliary: Calcified granulomata noted in the liver. Good opacification of the left and right hepatic arteries. There is no evidence for gallstones, gallbladder wall thickening, or pericholecystic fluid. No intrahepatic or extrahepatic biliary dilation. Pancreas: No focal mass lesion. No dilatation of the main duct. No intraparenchymal cyst. No peripancreatic edema. Spleen: No splenomegaly. No focal mass lesion. Adrenals/Urinary Tract: Bilateral renal cysts. No hydroureter. Bladder is nondistended. Stomach/Bowel: Stomach is unremarkable. No gastric wall thickening. No evidence of outlet obstruction. Duodenum is normally positioned as is the ligament of Treitz. No  small bowel wall thickening. No small bowel dilatation. Right groin hernia contains bowel loops without evidence of obstruction or bowel wall thickening. This appears to be small bowel. No gross colonic mass. Circumferential wall thickening noted in the rectum. Colonic wall thickening. Lymphatic: No abdominal lymphadenopathy.  No pelvic lymphadenopathy. Reproductive: The prostate gland and seminal vesicles are unremarkable. Other: No intraperitoneal free fluid. Musculoskeletal: No worrisome lytic or sclerotic osseous abnormality. Bilateral gynecomastia. Review of the MIP images confirms the above findings. IMPRESSION: 1. Type A aortic dissection originating in the region of the coronary cusps. Difficult to determine true from false lumen in the ascending aorta. No active extravasation in the mediastinum. Small nipple like projection of contrast between the ascending aorta and pulmonary trunk, indeterminate but  potentially related to the dissection flap/penetrating ulcer. 2. Dissection flap extends into the brachiocephalic artery, left common carotid artery, and left subclavian artery. There is decreased perfusion to the right subclavian artery and right vertebral artery consistent with involvement of the origin by dissection flap. Dissection extends up the left subclavian artery to the origin of the vertebral artery although no differential perfusion evident. 3. The dissection flap involves the origin of the SMA and there is abrupt non opacification of the mid SMA suggesting occlusion secondary to the dissection. 4. Celiac axis and SMA arise from the presumed true lumen (based on location). Left renal artery arises from the presumed true lumen but is involved by the dissection flap. Right renal artery arises from the presumed false lumen posterior to the flap. IMA arises from the presumed true lumen and is widely patent. 5. Dissection flap extends into the right common iliac artery bifurcation. Right external iliac artery is likely dissected and there is abrupt occlusion at the level of the right common femoral artery. Right femoral artery stent device noted in the proximal thigh. Right internal iliac artery shows marked luminal narrowing proximally and differentially decreased perfusion in the right internal iliac artery. 6. Dissection flap in the left common iliac artery appears to terminate prior to the bifurcation and there is good perfusion to both the left internal and external iliac arteries. 7. Circumferential wall thickening in the rectum. Proctitis is a consideration. Neoplasm not excluded. 8. Right groin hernia contains bowel loops without complicating features. Initial results of aortic dissection or called to Dr. Pearline Cables at 1844 hours on 2021/10/03. Final critical Value/emergent results were called by telephone at the time of interpretation on October 03, 2021 at 7:13 pm to provider Wynona Dove , who verbally  acknowledged these results. Electronically Signed   By: Misty Stanley M.D.   On: 10/03/21 19:18    Review of Systems  Blood pressure (!) 152/88, pulse 95, temperature 99 F (37.2 C), temperature source Oral, resp. rate (!) 27, height 5\' 11"  (1.803 m), weight 78 kg, SpO2 95 %. Physical Exam Vitals reviewed.  Constitutional:      General: He is in acute distress.  HENT:     Head: Normocephalic and atraumatic.  Eyes:     General: No scleral icterus. Cardiovascular:     Rate and Rhythm: Normal rate and regular rhythm.     Pulses:          Carotid pulses are 2+ on the right side and 2+ on the left side.      Radial pulses are 2+ on the right side and 2+ on the left side.       Dorsalis pedis pulses are 0 on the left side.  Posterior tibial pulses are 0 on the right side and 0 on the left side.     Heart sounds: No murmur heard.   Gallop (s4) present.  Pulmonary:     Effort: Pulmonary effort is normal. No respiratory distress.     Breath sounds: Normal breath sounds. No wheezing or rales.  Abdominal:     General: There is no distension.     Palpations: Abdomen is soft.  Neurological:     General: No focal deficit present.     Mental Status: He is alert and oriented to person, place, and time.     Cranial Nerves: No cranial nerve deficit.     Motor: Weakness (weak grip left hand) present.     Assessment/Plan 60 yo man with numerous medical problems presents with type I dissection with persistent CP and possible RLE malperfusion. Complicating factors include Plavix, Eliquis, amyloidosis, hx PE, possible pulmonary hypertension, CKD with baseline creatinine 1.6, and hepatitis B.  Originally entertained medical management and surgery at a later time once anticoagulant and Plavix had washed out, but with ongoing pain I do not feel that is a viable option.  He needs emergent surgery albeit at extremely high risk.  I discussed with the general nature of the procedure, including  the need for general anesthesia, the incisions to be used, the use of cardiopulmaonry bypass and hypothermic circulatory arrest with Mr. Faulcon and his wife.  We discussed the expected hospital stay, overall recovery and short and long term outcomes. I informed them of the indications, risks, benefits and alternatives. They understand the risks include but are not limited to death, stroke, MI, DVT/PE, bleeding, possible need for transfusion, infections, cardiac arrhythmias, as well as other organ system dysfunction including respiratory, renal, or GI complications.  Will likely need massive transfusions.  They understand high risk nature of procedure.Mortality ~15-20%, high risk for stroke, respiratory and renal failure.  He wishes to proceed  Melrose Nakayama, MD 09/14/2021, 8:25 PM

## 2021-09-11 NOTE — Progress Notes (Signed)
   09-27-21 2120  Clinical Encounter Type  Visited With Family;Health care provider  Visit Type Death  Referral From Nurse  Consult/Referral To Chaplain   Chaplain responded to page from Percy, Margreta Journey. Chaplain accompany Dr. Blase Mess and Dr.Fitzgerald to family room to inform the patient's wife Joseph Art, the patient's daughter, Magda Paganini and two other family members  of the patient's death. Chaplain provided ministry of presence, comfort,grief /emotional support and prayer. Chaplain provided Patient Placement card. Family selected [facility] [address], [place] #[phone] and she also informed Patient Placement Dept. This note was prepared by Jeanine Luz, M.Div..  For questions please contact by phone [phone].

## 2021-09-11 NOTE — Transfer of Care (Signed)
Immediate Anesthesia Transfer of Care Note  Patient: Samuel Butler  Procedure(s) Performed: SUBXYPHOID PERICARDIAL WINDOW (Chest)  Patient Location: deceased  Anesthesia Type:deceased  Level of Consciousness: deceased  Airway & Oxygen Therapy: deceased  Post-op Assessment: deceased  Post vital signs: deceased  Last Vitals:  Vitals Value Taken Time  BP    Temp    Pulse    Resp    SpO2      Last Pain:  Vitals:   Sep 06, 2021 1658  TempSrc: Oral  PainSc: 9          Complications: No notable events documented.

## 2021-09-11 NOTE — Progress Notes (Signed)
   09/05/21 1945  Clinical Encounter Type  Visited With Patient;Family  Visit Type ED;Spiritual support;Pre-op  Referral From Nurse  Consult/Referral To Chaplain   Chaplain responded to page from Nurse, Jasmin requesting Chaplain visit prior to surgery. Patient talking about his faith in God. Patient requested prayer and engage in prayer. Patient also requested readings from [place]. Chaplain accompanied the patient to the [facility] entrance. Chaplain met the patient's wife, daughter and family member in waiting area. Family requested prayer and appreciative of my time spent with the patient and accompanying him.  This note was prepared by Jeanine Luz, M.Div..  For questions please contact by phone [phone].

## 2021-09-11 NOTE — Anesthesia Procedure Notes (Signed)
Arterial Line Insertion Start/End2022/10/31 8:28 AM, September 10, 2021 8:35 PM Performed by: Josephine Igo, CRNA, CRNA  Patient location: OR. Lidocaine 1% used for infiltration Left, radial was placed Catheter size: 20 G Hand hygiene performed  and maximum sterile barriers used   Attempts: 1 Procedure performed without using ultrasound guided technique. Following insertion, dressing applied and Biopatch. Post procedure assessment: normal  Patient tolerated the procedure with difficulty.

## 2021-09-11 NOTE — ED Provider Notes (Signed)
[facility] EMERGENCY DEPARTMENT Provider Note   CSN: [id number] Arrival date & time: 23-Sep-2021  1547     History Chief Complaint  Patient presents with   Leg Pain    Samuel Butler is a 60 y.o. male.  This is a 60 y.o. male with significant medical history as below, including HLD, HTN, PE who presents to the ED with complaint of chest pain, abdominal pain, leg pain.  Patient ports onset of symptoms approximately 11 AM this morning.  Intermittent chest pain abdominal pain, nausea and vomiting.  Subjective fevers and chills.  Diffuse abdominal discomfort. Leg pain. He has not experienced similar symptoms in the past.  Denies prior abdominal surgery.  No trauma.  He is on blood thinners, last dose yesterday.   Location:  abdomen Duration:  7 hours Onset:  sudden Timing:  constant Description:  sharp stabbing diffuse Severity:  moderate Exacerbating/Alleviating Factors:  worse with palpation, movement Associated Symptoms:  cp, dib, cough, fever, n/v Pertinent Negatives:  no trauma    The history is provided by the patient. No language interpreter was used.  Leg Pain Associated symptoms: fever       Past Medical History:  Diagnosis Date   Cancer ([place])    DDD (degenerative disc disease), lumbar    per his report   Diabetes mellitus without complication ([place])    Gout 04/16/2013   Hyperlipidemia    Hypertension    IBD (inflammatory bowel disease) 04/05/2016   Pulmonary embolism ([facility])    Tobacco use disorder 03/28/2015    Patient Active Problem List   Diagnosis Date Noted   Aortic dissection ([place]) 2021-09-23   Pain due to onychomycosis of toenails of both feet 05/16/2021   Coronary artery disease 03/13/2021   Acute pulmonary embolism ([place]) 10/18/2020   Groin pain, right 08/31/2020   Chronic viral hepatitis B without delta-agent ([place]) 08/21/2020   Amyloidosis ([place]) 08/21/2020   Anasarca 08/04/2020   Nephrotic syndrome 08/03/2020   AKI (acute  kidney injury) ([place]) 08/03/2020   Hypokalemia 06/27/2020   Claudication in peripheral vascular disease ([place]) 06/26/2020   Hypertension associated with diabetes ([place]) 05/25/2020   Peripheral arterial disease ([place]) 03/08/2020   Vitamin D deficiency 10/26/2019   Hypoalbuminemia 10/26/2019   IBD (inflammatory bowel disease) 04/05/2016   Tobacco use disorder 03/28/2015   Non-insulin treated type 2 diabetes mellitus ([place]) 01/25/2014   Essential hypertension, benign 04/16/2013   Hyperlipemia 04/16/2013   Gout 04/16/2013    Past Surgical History:  Procedure Laterality Date   ABDOMINAL AORTOGRAM W/LOWER EXTREMITY Right 06/26/2020   Procedure: ABDOMINAL AORTOGRAM W/LOWER EXTREMITY;  Surgeon: Lorretta Harp, MD;  Location: [facility];  Service: Cardiovascular;  Laterality: Right;   COLONOSCOPY     PERIPHERAL VASCULAR INTERVENTION  06/26/2020   Procedure: PERIPHERAL VASCULAR INTERVENTION;  Surgeon: Lorretta Harp, MD;  Location: [facility];  Service: Cardiovascular;;  Right SFA   SUBXYPHOID PERICARDIAL WINDOW N/A 09/23/2021   Procedure: SUBXYPHOID PERICARDIAL WINDOW;  Surgeon: Melrose Nakayama, MD;  Location: [facility];  Service: Vascular;  Laterality: N/A;   TONSILLECTOMY         Family History  Problem Relation Age of Onset   Hypertension Father    Diabetes Father    Stroke Father    Pancreatic cancer Father    Colon cancer Brother    Esophageal cancer Neg Hx    Stomach cancer Neg Hx     Social History   Tobacco Use  Smoking status: Former    Types: Cigarettes    Quit date: 10/23/2016    Years since quitting: 4.8   Smokeless tobacco: Never   Tobacco comments:    per patient 5 cigarettes a day   Vaping Use   Vaping Use: Never used  Substance Use Topics   Alcohol use: Not Currently    Comment: occasional   Drug use: Not Currently    Home Medications Prior to Admission medications   Medication Sig Start Date End Date Taking? Authorizing Provider   acetaminophen (TYLENOL) 650 MG CR tablet Take 1,300 mg by mouth every 8 (eight) hours as needed for pain.     [provider]  acyclovir (ZOVIRAX) 400 MG tablet Take 1 tablet by mouth twice daily 09/03/21   Orson Slick, MD  albuterol (VENTOLIN HFA) 108 (90 Base) MCG/ACT inhaler Inhale 2 puffs into the lungs every 6 (six) hours as needed for wheezing or shortness of breath. 09/28/20   Orson Slick, MD  allopurinol (ZYLOPRIM) 100 MG tablet Take 100 mg by mouth daily. Patient not taking: Reported on 08/31/2021 08/26/21   [provider]  atorvastatin (LIPITOR) 80 MG tablet Take 1 tablet by mouth once daily 06/13/21   Isaac Bliss, Rayford Halsted, MD  cilostazol (PLETAL) 50 MG tablet Take 1 tablet (50 mg total) by mouth 2 (two) times daily. 07/30/21   Lorretta Harp, MD  clopidogrel (PLAVIX) 75 MG tablet Take 1 tablet by mouth once daily with breakfast 07/18/21   Lorretta Harp, MD  dicyclomine (BENTYL) 20 MG tablet TAKE 1 TABLET BY MOUTH 4 TIMES DAILY 20  TO  30  MINUTES  BEFORE  MEALS  AND  AT  BEDTIME 08/20/21   Lemmon, Lavone Nian, PA  ELIQUIS 5 MG TABS tablet TAKE 1 TABLET BY MOUTH TWICE DAILY . APPOINTMENT REQUIRED FOR FUTURE REFILLS 09/03/21   Isaac Bliss, Rayford Halsted, MD  Evolocumab (REPATHA SURECLICK) 423 MG/ML SOAJ Inject 140 mg into the skin every 14 (fourteen) days. 12/28/20   Lorretta Harp, MD  ezetimibe (ZETIA) 10 MG tablet Take 1 tablet by mouth once daily 08/20/21   Lelon Perla, MD  glycopyrrolate (ROBINUL) 2 MG tablet Take 1 tablet (2 mg total) by mouth 2 (two) times daily before a meal. Before breakfast and dinner 08/15/21   Ladene Artist, MD  HYDROcodone-acetaminophen (NORCO) 7.5-325 MG tablet hydrocodone 7.5 mg-acetaminophen 325 mg tablet  TAKE 1 TABLET BY MOUTH EVERY 6 HOURS AS NEEDED FOR MODERATE PAIN FOR UP TO 5 DAYS    [provider]  hydrocortisone (ANUSOL-HC) 25 MG suppository Place 1 suppository (25 mg total) rectally at  bedtime. 08/15/21   Ladene Artist, MD  isosorbide mononitrate (IMDUR) 30 MG 24 hr tablet Take 1 tablet by mouth once daily 01/02/21   Isaac Bliss, Rayford Halsted, MD  loperamide (IMODIUM) 1 MG/5ML solution Take 2 mg by mouth as needed for diarrhea or loose stools.    [provider]  losartan (COZAAR) 50 MG tablet Take 1 tablet by mouth once daily 08/22/21   Lorretta Harp, MD  metFORMIN (GLUCOPHAGE) 500 MG tablet Take 1 tablet by mouth once daily with breakfast 03/12/21   Isaac Bliss, Rayford Halsted, MD  ondansetron (ZOFRAN) 8 MG tablet TAKE 1 TABLET BY MOUTH EVERY 8 HOURS AS NEEDED FOR NAUSEA OR VOMITING 10/27/20   Orson Slick, MD  pantoprazole (PROTONIX) 40 MG tablet Take 1 tablet (40 mg total) by mouth  daily. 01/18/21   Isaac Bliss, Rayford Halsted, MD  potassium chloride SA (KLOR-CON) 20 MEQ tablet Take 1 tablet by mouth twice daily 08/22/21   Orson Slick, MD  torsemide (DEMADEX) 20 MG tablet Take 1 tablet (20 mg total) by mouth 2 (two) times daily. 40mg  BID x 3 days then back to 20mg  BID 11/29/20   Cleaver, [name], NP  VEMLIDY 25 MG TABS Take 1 tablet (25 mg total) by mouth daily. 05/29/21   Mignon Pine, DO  Vitamin D, Ergocalciferol, (DRISDOL) 1.25 MG (50000 UNIT) CAPS capsule Take 1 capsule by mouth once a week 12/05/20   Isaac Bliss, Rayford Halsted, MD    Allergies    Crestor [rosuvastatin calcium]  Review of Systems   Review of Systems  Constitutional:  Positive for chills and fever.  HENT:  Negative for facial swelling and trouble swallowing.   Eyes:  Negative for photophobia and visual disturbance.  Respiratory:  Positive for shortness of breath. Negative for cough.   Cardiovascular:  Positive for chest pain. Negative for palpitations.  Gastrointestinal:  Positive for abdominal pain, nausea and vomiting.  Endocrine: Negative for polydipsia and polyuria.  Genitourinary:  Negative for difficulty urinating and hematuria.  Musculoskeletal:  Negative for gait  problem and joint swelling.  Skin:  Negative for pallor and rash.  Neurological:  Negative for syncope and headaches.  Psychiatric/Behavioral:  Negative for agitation and confusion.    Physical Exam Updated Vital Signs BP (!) 152/88   Pulse 95   Temp 99 F (37.2 C) (Oral)   Resp (!) 27   Ht 5\' 11"  (1.803 m)   Wt 78 kg   SpO2 95%   BMI 23.98 kg/m   Physical Exam Vitals and nursing note reviewed.  Constitutional:      General: He is in acute distress.     Appearance: He is well-developed. He is ill-appearing.  HENT:     Head: Normocephalic and atraumatic.     Right Ear: External ear normal.     Left Ear: External ear normal.     Mouth/Throat:     Mouth: Mucous membranes are moist.  Eyes:     General: No scleral icterus. Cardiovascular:     Rate and Rhythm: Normal rate and regular rhythm.     Pulses: Normal pulses.          Radial pulses are 2+ on the right side and 2+ on the left side.     Heart sounds: Normal heart sounds.     Comments: Diminished pulses DP/ PT, cap refill is brisk Pulmonary:     Effort: Pulmonary effort is normal. No respiratory distress.     Breath sounds: Normal breath sounds.  Abdominal:     General: Abdomen is flat. Bowel sounds are normal.     Palpations: Abdomen is soft.     Tenderness: There is generalized abdominal tenderness. There is no right CVA tenderness or left CVA tenderness.     Comments: Guarding   Musculoskeletal:        General: Normal range of motion.     Cervical back: Normal range of motion.     Right lower leg: No edema.     Left lower leg: No edema.  Skin:    General: Skin is warm and dry.     Capillary Refill: Capillary refill takes less than 2 seconds.  Neurological:     Mental Status: He is alert and oriented to person, place, and time.  GCS: GCS eye subscore is 4. GCS verbal subscore is 5. GCS motor subscore is 6.  Psychiatric:        Mood and Affect: Mood normal.        Behavior: Behavior normal.    ED  Results / Procedures / Treatments   Labs (all labs ordered are listed, but only abnormal results are displayed) Labs Reviewed  RESP PANEL BY RT-PCR (FLU A&B, COVID) ARPGX2 - Abnormal; Notable for the following components:      Result Value   SARS Coronavirus 2 by RT PCR POSITIVE (*)    All other components within normal limits  BRAIN NATRIURETIC PEPTIDE - Abnormal; Notable for the following components:   B Natriuretic Peptide 399.4 (*)    All other components within normal limits  LACTIC ACID, PLASMA - Abnormal; Notable for the following components:   Lactic Acid, Venous 4.3 (*)    All other components within normal limits  COMPREHENSIVE METABOLIC PANEL - Abnormal; Notable for the following components:   Glucose, Bld 151 (*)    BUN 37 (*)    Creatinine, Ser 1.58 (*)    Calcium 8.3 (*)    Total Protein 4.3 (*)    Albumin 1.9 (*)    AST 63 (*)    ALT 63 (*)    GFR, Estimated 49 (*)    All other components within normal limits  CBC WITH DIFFERENTIAL/PLATELET - Abnormal; Notable for the following components:   RBC 3.00 (*)    Hemoglobin 9.4 (*)    HCT 29.5 (*)    All other components within normal limits  TROPONIN I (HIGH SENSITIVITY) - Abnormal; Notable for the following components:   Troponin I (High Sensitivity) 63 (*)    All other components within normal limits  CULTURE, BLOOD (ROUTINE X 2)  CULTURE, BLOOD (ROUTINE X 2)  URINE CULTURE  PROTIME-INR  APTT  TYPE AND SCREEN  PREPARE RBC (CROSSMATCH)  MASSIVE TRANSFUSION PROTOCOL ORDER (BLOOD BANK NOTIFICATION)  PREPARE FRESH FROZEN PLASMA  PREPARE PLATELET PHERESIS  PREPARE CRYOPRECIPITATE  TROPONIN I (HIGH SENSITIVITY)    EKG EKG Interpretation  Date/Time:  09/14/21 16:19:53 EDT Ventricular Rate:  90 PR Interval:  182 QRS Duration: 96 QT Interval:  426 QTC Calculation: 521 R Axis:   -34 Text Interpretation: Sinus rhythm with sinus arrhythmia with occasional Premature ventricular complexes  Prolonged QT Abnormal ECG Similar to prior tracing Confirmed by Wynona Dove ([id number]) on 09-14-21 6:08:47 PM  Radiology DG Chest 1 View  Result Date: 2021/09/14 CLINICAL DATA:  Chest pain and shortness of breath. EXAM: CHEST  1 VIEW COMPARISON:  August 09, 2021 FINDINGS: The lungs are hyperinflated. Mild bibasilar atelectasis is noted. There is no evidence of a pleural effusion or pneumothorax. The heart size and mediastinal contours are within normal limits. Degenerative changes seen throughout the thoracic spine. IMPRESSION: Mild bibasilar atelectasis. Electronically Signed   By: Virgina Norfolk M.D.   On: 2021-09-14 19:12   CT Angio Chest/Abd/Pel for Dissection W and/or Wo Contrast  Result Date: 2021/09/14 CLINICAL DATA:  Chest pain and hypotension. EXAM: CT ANGIOGRAPHY CHEST, ABDOMEN AND PELVIS TECHNIQUE: Pre contrast imaging of the chest was performed. Multidetector CT imaging through the chest, abdomen and pelvis was performed using the standard protocol during bolus administration of intravenous contrast. Multiplanar reconstructed images and MIPs were obtained and reviewed to evaluate the vascular anatomy. CONTRAST:  113mL OMNIPAQUE IOHEXOL 350 MG/ML SOLN COMPARISON:  CT a chest 08/03/2021 for pulmonary embolus. FINDINGS:  CTA CHEST FINDINGS Cardiovascular: Heart is enlarged with pericardial effusion, increased since 08/03/2021. Pericardial fluid measures 17 mm in thickness anterior to the interventricular septum. As noted on prior studies, ascending thoracic aortic aneurysm noted measuring up to 5.5 cm diameter. There is a dissection flap in the ascending aorta originating in the region of the coronary cusps. Both false and true lumen opacify in the ascending aorta and is difficult to establish which lumen represents the true lumen and which lumen represents the false lumen. Dissection flap extends into the brachiocephalic artery, left common carotid artery, and the left subclavian artery. The  right subclavian and vertebral arteries show differential decreased perfusion. Dissection flap in the left subclavian artery extends to the origin of the left vertebral artery which does opacify. Small nipple like projection of contrast is seen between the ascending aorta and pulmonary trunk on axial 81/5, indeterminate but potentially related to the dissection flap/penetrating ulcer Mediastinum/Nodes: No mediastinal lymphadenopathy. There is no hilar lymphadenopathy. The esophagus has normal imaging features. There is no axillary lymphadenopathy. Lungs/Pleura: Basilar atelectasis noted in the lower lungs bilaterally. No pleural effusion. Musculoskeletal: No worrisome lytic or sclerotic osseous abnormality. Review of the MIP images confirms the above findings. CTA ABDOMEN AND PELVIS FINDINGS VASCULAR Aorta: As noted above, it is difficult to establish which lumen is the true lumen versus the false lumen. Celiac axis and SMA often arise from the true lumen and both of these branch vessels arise from the more anterior of the 2 lumen on today's study and are well opacified. IMA also arises from the anterior lumen and is well opacified. Left renal artery arises from the anterior lumen while the right renal artery arises posterior to the dissection flap from the right posterolateral lumen. The more posterior of the 2 lumen of becomes compressed in the mid and distal abdominal aorta with dissection flap extending into both common iliac arteries. Celiac: Widely patent arising from the more anterior of the 2 lumen, presumably the true lumen. SMA: Widely patent and well opacified proximally. The dissection flap involves the origin of the SMA and there is a suggestion that the dissection extends out into the SMA (see axial images 185-187 of series 5. There is abrupt non opacification of the SMA (197/5) suggesting occlusion secondary to the dissection. Renals: As above, the left renal artery arises from the more anterior of  the 2 lumen a although the dissection flap does extend into the proximal left renal artery. Right renal artery arises from the more posterior of the 2 lumen a, potentially the false lumen. IMA: Widely patent arising from the more anterior lumen. Inflow: Dissection flap extends into both common iliac arteries with extension into the right common iliac artery bifurcation. Right external iliac artery (278/5) is likely dissected and and demonstrates abrupt occlusion at the level of the right common femoral artery (image 294/5). Right femoral artery stent device noted in the proximal thigh. There is evidence of dissection flap extension into the right internal iliac artery with marked luminal narrowing proximally and differentially decreased perfusion in the right internal iliac artery. Dissection flap appears to terminate in the left common iliac artery with good opacification of the left external and internal iliac arteries. Veins: Not well evaluated given arterial bolus timing. Review of the MIP images confirms the above findings. NON-VASCULAR Hepatobiliary: Calcified granulomata noted in the liver. Good opacification of the left and right hepatic arteries. There is no evidence for gallstones, gallbladder wall thickening, or pericholecystic fluid. No intrahepatic or  extrahepatic biliary dilation. Pancreas: No focal mass lesion. No dilatation of the main duct. No intraparenchymal cyst. No peripancreatic edema. Spleen: No splenomegaly. No focal mass lesion. Adrenals/Urinary Tract: Bilateral renal cysts. No hydroureter. Bladder is nondistended. Stomach/Bowel: Stomach is unremarkable. No gastric wall thickening. No evidence of outlet obstruction. Duodenum is normally positioned as is the ligament of Treitz. No small bowel wall thickening. No small bowel dilatation. Right groin hernia contains bowel loops without evidence of obstruction or bowel wall thickening. This appears to be small bowel. No gross colonic mass.  Circumferential wall thickening noted in the rectum. Colonic wall thickening. Lymphatic: No abdominal lymphadenopathy.  No pelvic lymphadenopathy. Reproductive: The prostate gland and seminal vesicles are unremarkable. Other: No intraperitoneal free fluid. Musculoskeletal: No worrisome lytic or sclerotic osseous abnormality. Bilateral gynecomastia. Review of the MIP images confirms the above findings. IMPRESSION: 1. Type A aortic dissection originating in the region of the coronary cusps. Difficult to determine true from false lumen in the ascending aorta. No active extravasation in the mediastinum. Small nipple like projection of contrast between the ascending aorta and pulmonary trunk, indeterminate but potentially related to the dissection flap/penetrating ulcer. 2. Dissection flap extends into the brachiocephalic artery, left common carotid artery, and left subclavian artery. There is decreased perfusion to the right subclavian artery and right vertebral artery consistent with involvement of the origin by dissection flap. Dissection extends up the left subclavian artery to the origin of the vertebral artery although no differential perfusion evident. 3. The dissection flap involves the origin of the SMA and there is abrupt non opacification of the mid SMA suggesting occlusion secondary to the dissection. 4. Celiac axis and SMA arise from the presumed true lumen (based on location). Left renal artery arises from the presumed true lumen but is involved by the dissection flap. Right renal artery arises from the presumed false lumen posterior to the flap. IMA arises from the presumed true lumen and is widely patent. 5. Dissection flap extends into the right common iliac artery bifurcation. Right external iliac artery is likely dissected and there is abrupt occlusion at the level of the right common femoral artery. Right femoral artery stent device noted in the proximal thigh. Right internal iliac artery shows marked  luminal narrowing proximally and differentially decreased perfusion in the right internal iliac artery. 6. Dissection flap in the left common iliac artery appears to terminate prior to the bifurcation and there is good perfusion to both the left internal and external iliac arteries. 7. Circumferential wall thickening in the rectum. Proctitis is a consideration. Neoplasm not excluded. 8. Right groin hernia contains bowel loops without complicating features. Initial results of aortic dissection or called to Dr. Pearline Cables at 1844 hours on September 18, 2021. Final critical Value/emergent results were called by telephone at the time of interpretation on 18-Sep-2021 at 7:13 pm to provider Wynona Dove , who verbally acknowledged these results. Electronically Signed   By: Misty Stanley M.D.   On: 09-18-21 19:18    Procedures .Critical Care Performed by: Jeanell Sparrow, DO Authorized by: Jeanell Sparrow, DO   Critical care provider statement:    Critical care time (minutes):  78   Critical care time was exclusive of:  Separately billable procedures and treating other patients   Critical care was necessary to treat or prevent imminent or life-threatening deterioration of the following conditions:  Shock and circulatory failure (aortic dissection)   Critical care was time spent personally by me on the following activities:  Ordering and performing  treatments and interventions, ordering and review of laboratory studies, ordering and review of radiographic studies, pulse oximetry, re-evaluation of patient's condition, review of old charts, obtaining history from patient or surrogate, examination of patient, evaluation of patient's response to treatment, discussions with consultants and development of treatment plan with patient or surrogate   Care discussed with: admitting provider     Medications Ordered in ED Medications  lactated ringers bolus 1,000 mL (1,000 mLs Intravenous New Bag/Given 2021/09/06 1718)  cefTRIAXone  (ROCEPHIN) 2 g in sodium chloride 0.9 % 100 mL IVPB (0 g Intravenous Stopped 09-06-21 1949)  desmopressin (DDAVP) 20 mcg in sodium chloride 0.9 % 50 mL IVPB (0 mcg Intravenous Stopped Sep 06, 2021 1949)  prothrombin complex conc human (KCENTRA) IVPB 3,816 Units (0 Units Intravenous Stopped 09-06-21 1950)  iohexol (OMNIPAQUE) 350 MG/ML injection 100 mL (100 mLs Intravenous Contrast Given 06-Sep-2021 1834)  morphine 4 MG/ML injection 4 mg (4 mg Intravenous Given 09-06-2021 1930)  labetalol (NORMODYNE) injection 20 mg (20 mg Intravenous Given 2021-09-06 1942)    ED Course  I have reviewed the triage vital signs and the nursing notes.  Pertinent labs & imaging results that were available during my care of the patient were reviewed by me and considered in my medical decision making (see chart for details).  Clinical Course as of 09/05/21 1545  Tue 2021-09-06 CT Angio Chest/Abd/Pel for Dissection W and/or Wo Contrast [SG]    Clinical Course User Index [SG] Jeanell Sparrow, DO   MDM Rules/Calculators/A&P                           CC: abd pain/ chest pain  This patient complains of abd pain/ cp; this involves an extensive number of treatment options and is a complaint that carries with it a high risk of complications and morbidity. Vital signs were reviewed. Serious etiologies considered.  Record review:   Previous records obtained and reviewed    Additional history obtained from family at bedside   Work up as above, notable for:  Labs & imaging results that were available during my care of the patient were reviewed by me and considered in my medical decision making.   I ordered imaging studies which included CXR, CT dissection and I independently visualized and interpreted imaging which showed type A dissection. See radiology report for full findings, multiple vascular abnormalities.   Pt on eliquis/plavix, will begin reversal with Kcentra and DDAVP, platelets. Initiate MTP as pt  hypotensive, anticoagulated, dissection. Likely will require multiple units of blood. Hypotensive and tachycardic - concern for hemorrhagic shock 2/2 dissection.   D/w cardiothoracic surgery, will come to evaluate the patient. Likely not surgical candidate at this given anticoagulated status but he will come to eval, Dr Roxan Hockey.   BP remains 07-371 systolic range, HR 06'Y. If BP improves will add beta blocker but at this moment feel would impair hemodynamic status.   7:20 PM Pt bp improving, will give analgesics. If BP remains stable will start BP control.  D/w CCM who will come to evaluate the patient.  D/w further with cardiothoracics, they will take patient to OR, plan for ICU following OR. D/w family at bedside, all questions answered. Dispo to OR. Patient is critically ill at this time.       This chart was dictated using voice recognition software.  Despite best efforts to proofread,  errors can occur which can change the documentation meaning.  Final  Clinical Impression(s) / ED Diagnoses Final diagnoses:  Dissection of ascending aorta  Anticoagulated  Hemorrhagic shock [facility])    Rx / DC Orders ED Discharge Orders     None        Jeanell Sparrow, DO 09/05/21 1545

## 2021-09-11 NOTE — ED Notes (Signed)
Blood bank notified - discontinue MTP-

## 2021-09-11 NOTE — Anesthesia Postprocedure Evaluation (Signed)
Anesthesia Post Note  Patient: Samuel Butler  Procedure(s) Performed: SUBXYPHOID PERICARDIAL WINDOW (Chest)     Comments: Pt went into PEA prior to induction of anesthesia. See OR notes for full details. Pt expired in the OR at 2114.   No notable events documented.  Last Vitals:  Vitals:   September 14, 2021 2005 2021/09/14 2008  BP: (!) 154/89 (!) 152/88  Pulse: 92 95  Resp: (!) 23 (!) 27  Temp:    SpO2: 91% 95%    Last Pain:  Vitals:   2021/09/14 1658  TempSrc: Oral  PainSc: 9                  Tiajuana Amass

## 2021-09-11 NOTE — Anesthesia Preprocedure Evaluation (Signed)
Anesthesia Evaluation  Patient identified by MRN, date of birth, ID band Patient awake    Reviewed: Allergy & Precautions, NPO status , Patient's Chart, lab work & pertinent test results  Airway Mallampati: II  TM Distance: >3 FB Neck ROM: Full    Dental  (+) Dental Advisory Given   Pulmonary former smoker,    breath sounds clear to auscultation       Cardiovascular hypertension, Pt. on medications + CAD and + Peripheral Vascular Disease   Rhythm:Regular Rate:Normal     Neuro/Psych negative neurological ROS     GI/Hepatic negative GI ROS, (+) Hepatitis -  Endo/Other  diabetes  Renal/GU CRFRenal disease     Musculoskeletal  (+) Arthritis ,   Abdominal   Peds  Hematology  (+) Blood dyscrasia, anemia ,   Anesthesia Other Findings   Reproductive/Obstetrics                             Lab Results  Component Value Date   WBC 8.5 September 21, 2021   HGB 9.4 (L) September 21, 2021   HCT 29.5 (L) 2021/09/21   MCV 98.3 Sep 21, 2021   PLT 206 2021/09/21   Lab Results  Component Value Date   CREATININE 1.58 (H) September 21, 2021   BUN 37 (H) 09-21-21   NA 143 2021/09/21   K 3.5 Sep 21, 2021   CL 108 09/21/21   CO2 25 2021/09/21    Anesthesia Physical Anesthesia Plan  ASA: 5 and emergent  Anesthesia Plan: General   Post-op Pain Management:    Induction: Intravenous  PONV Risk Score and Plan: 2 and Treatment may vary due to age or medical condition  Airway Management Planned: Oral ETT  Additional Equipment: Arterial line, CVP, PA Cath, TEE and Ultrasound Guidance Line Placement  Intra-op Plan:   Post-operative Plan: Post-operative intubation/ventilation  Informed Consent: I have reviewed the patients History and Physical, chart, labs and discussed the procedure including the risks, benefits and alternatives for the proposed anesthesia with the patient or authorized representative who has  indicated his/her understanding and acceptance.       Plan Discussed with: CRNA  Anesthesia Plan Comments:         Anesthesia Quick Evaluation

## 2021-09-11 NOTE — ED Notes (Signed)
Cardiothoracic surgeon at bedside. Verbal order from CV surgeon to stop massive blood transfusion. Infusion stopped at this time.

## 2021-09-11 DEATH — deceased

## 2021-10-05 ENCOUNTER — Encounter: Payer: BC Managed Care – PPO | Admitting: Dietician

## 2021-10-05 ENCOUNTER — Other Ambulatory Visit: Payer: BC Managed Care – PPO

## 2021-10-05 ENCOUNTER — Ambulatory Visit: Payer: BC Managed Care – PPO

## 2021-11-02 ENCOUNTER — Ambulatory Visit: Payer: BC Managed Care – PPO

## 2021-11-02 ENCOUNTER — Ambulatory Visit: Payer: BC Managed Care – PPO | Admitting: Hematology and Oncology

## 2021-11-02 ENCOUNTER — Other Ambulatory Visit: Payer: BC Managed Care – PPO

## 2021-11-27 ENCOUNTER — Ambulatory Visit: Payer: BC Managed Care – PPO | Admitting: Podiatry

## 2021-11-30 ENCOUNTER — Other Ambulatory Visit: Payer: BC Managed Care – PPO

## 2021-11-30 ENCOUNTER — Ambulatory Visit: Payer: BC Managed Care – PPO

## 2021-11-30 ENCOUNTER — Ambulatory Visit: Payer: BC Managed Care – PPO | Admitting: Hematology and Oncology

## 2021-12-25 IMAGING — DX DG CHEST 2V
2 series · 2 of 2 positions shown · non-contrast
Comparison: Chest x-ray 12/18/2001 report without images.

CLINICAL DATA: Shortness of breath, leg swelling

EXAM:
CHEST - 2 VIEW

[chest pa]
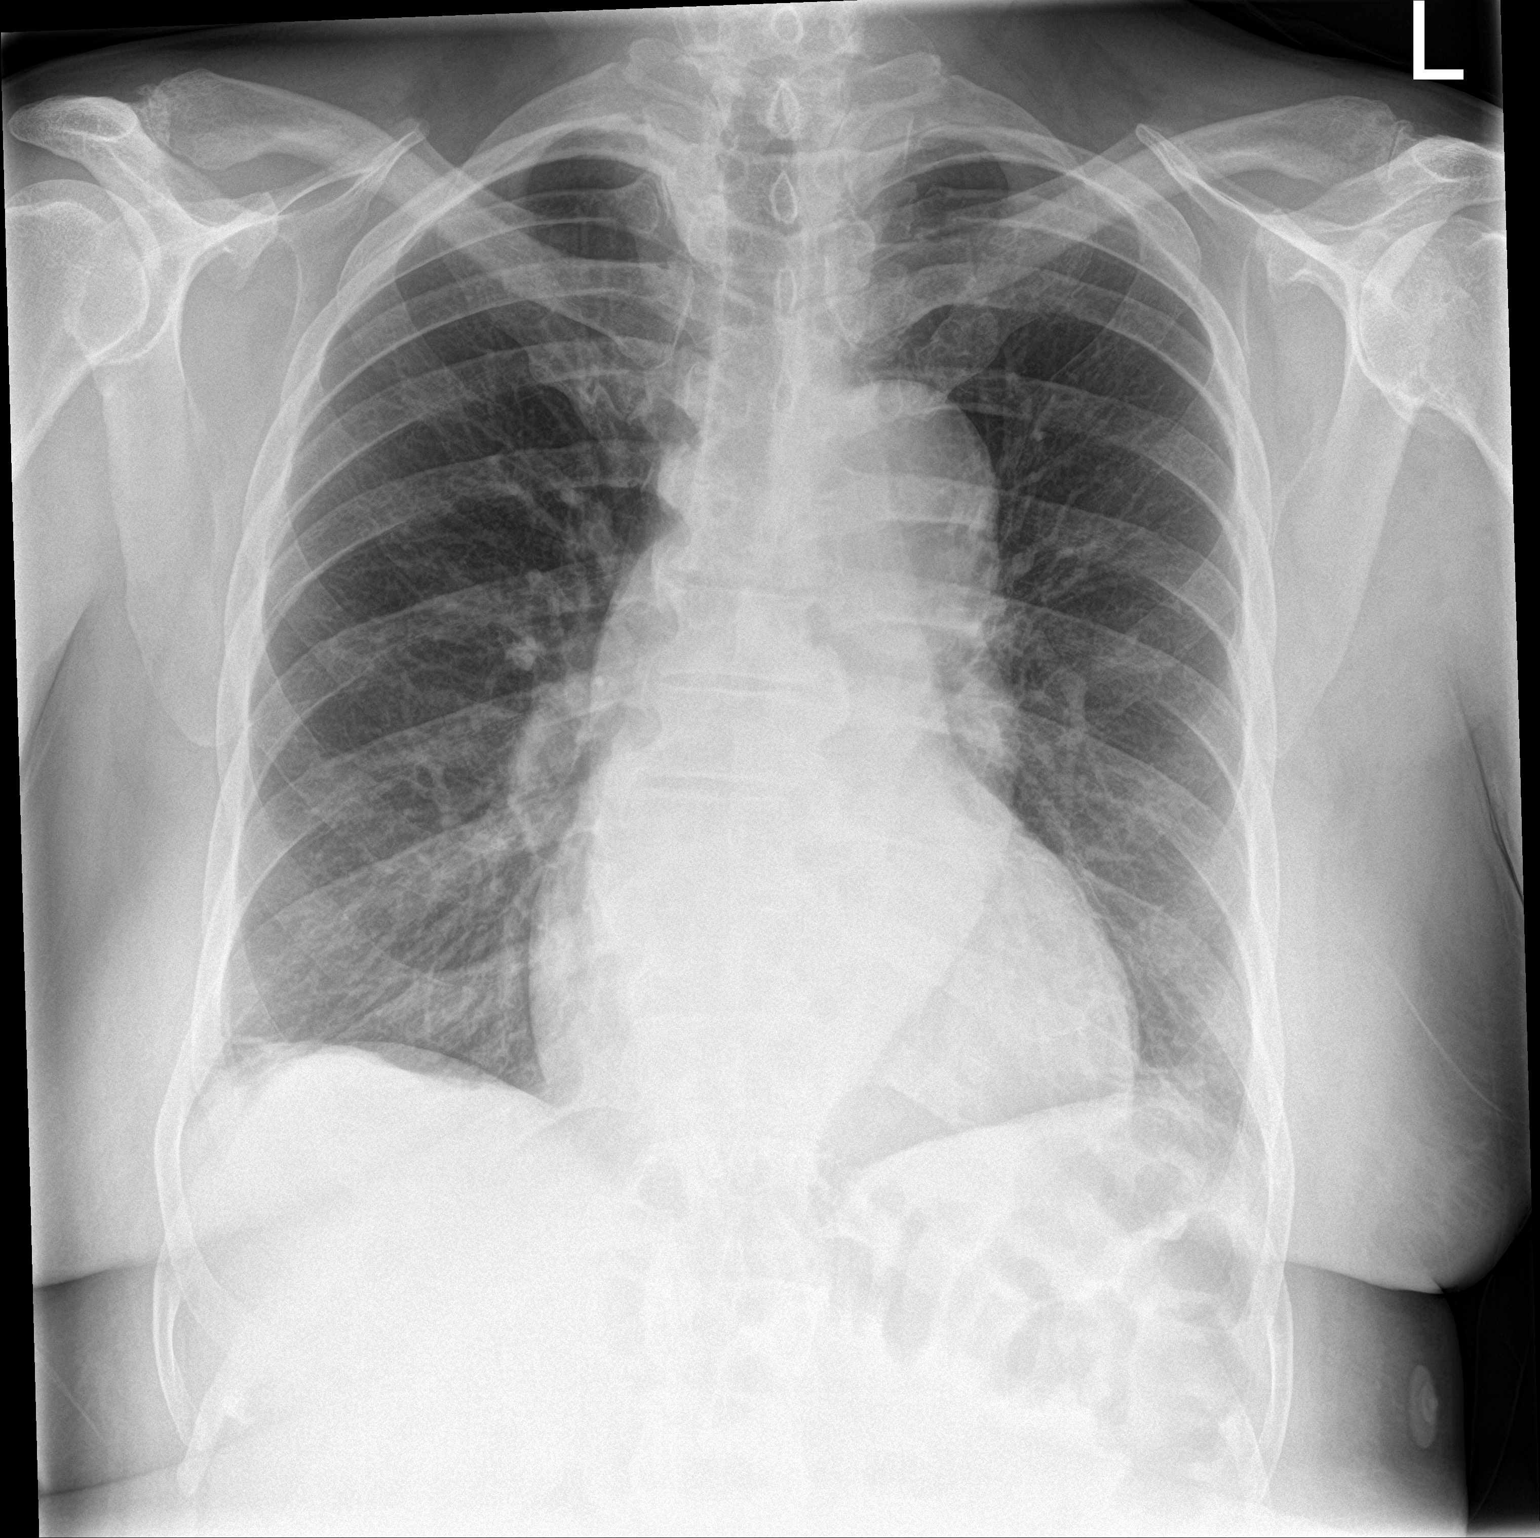

[chest lat]
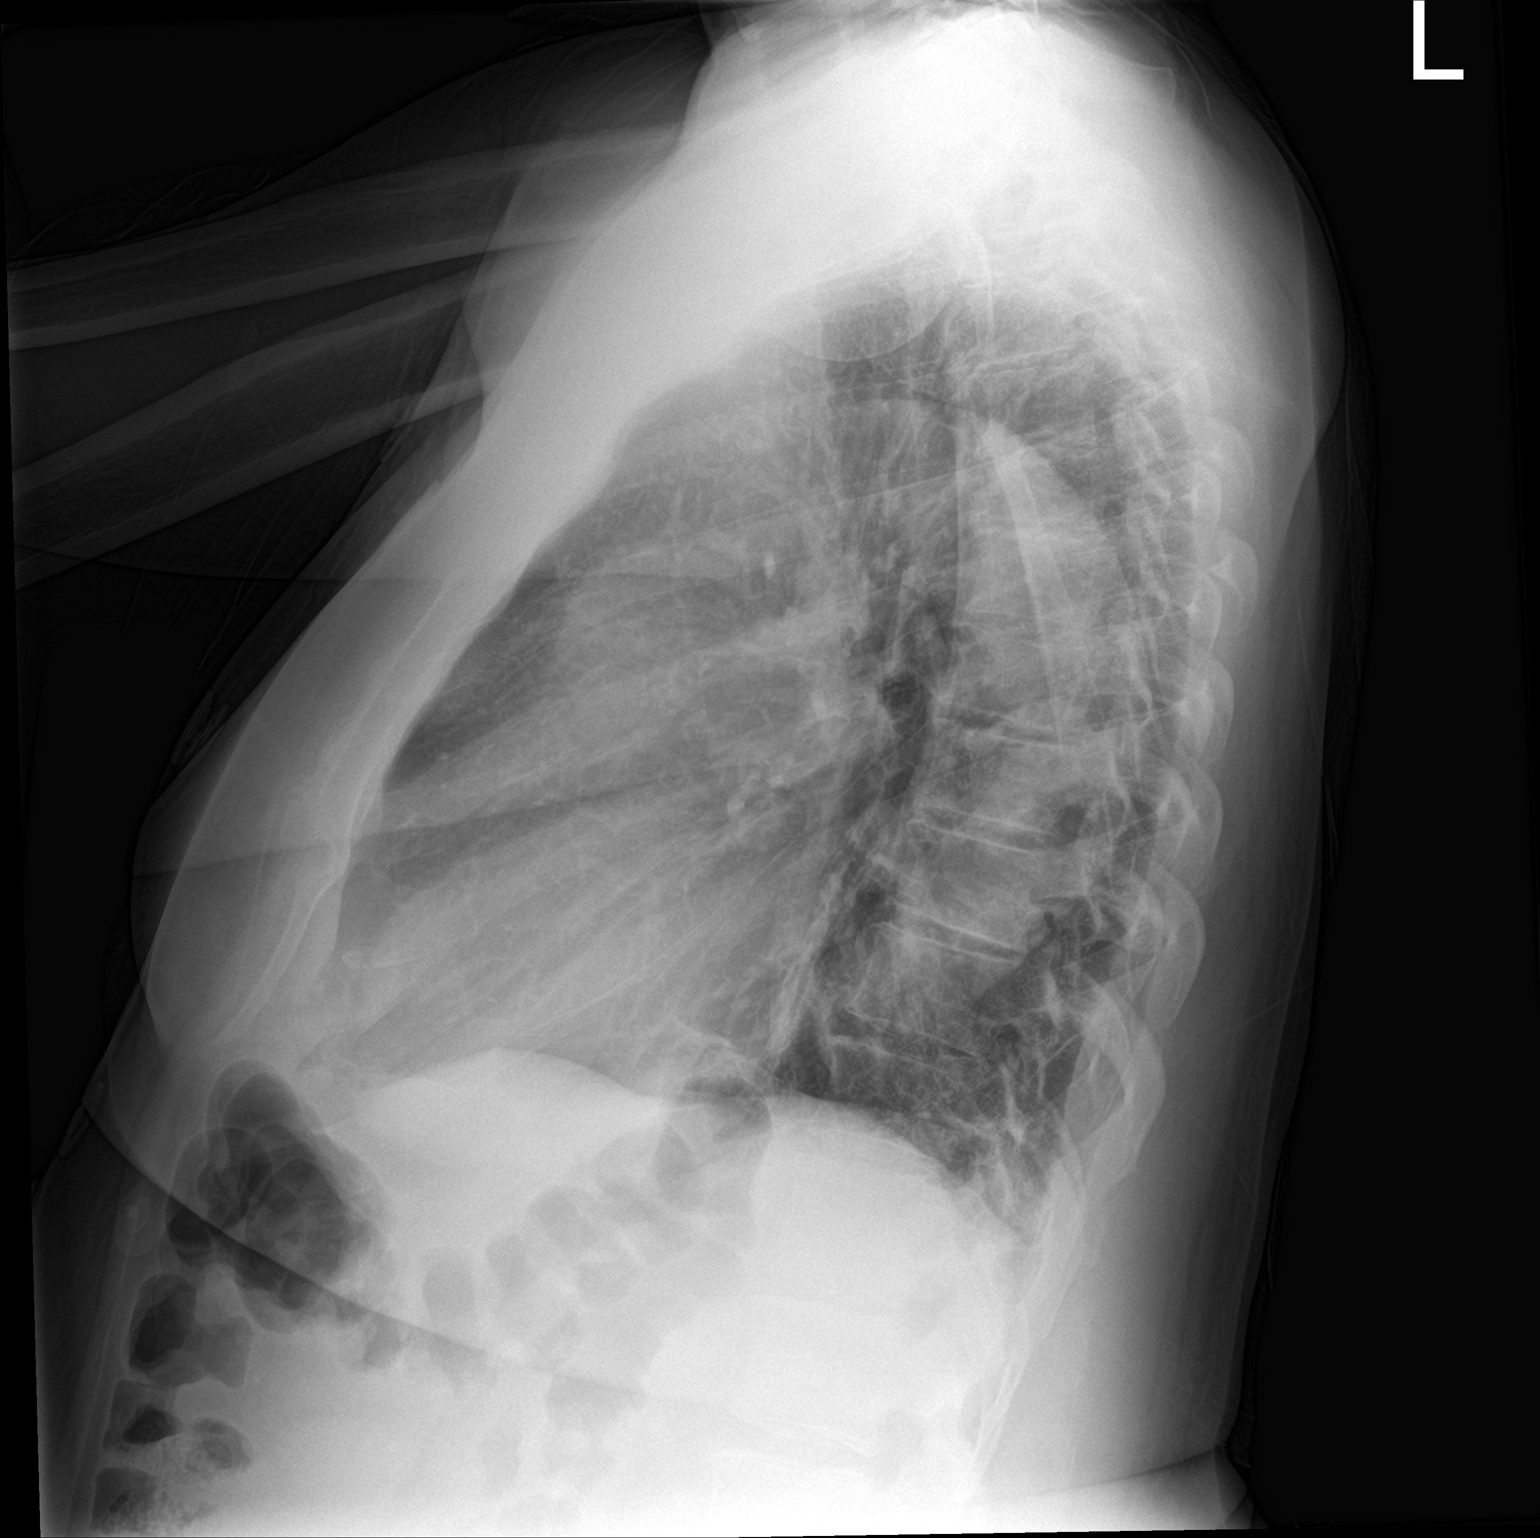

[2 of 2 positions shown; findings below may reference images not displayed]

FINDINGS: The heart size and mediastinal contours are within normal limits.

Bibasilar linear atelectasis. No focal consolidation. No pulmonary
edema. No pleural effusion. No pneumothorax.

No acute osseous abnormality.
IMPRESSION: No active cardiopulmonary disease.

## 2022-01-23 IMAGING — CT CT BIOPSY
1 of 2 series · 15 of 28 positions shown, 19 images · non-contrast
Comparison: none

INDICATION: 5-year-old male with clinical concern for renal amyloidosis. Bone
marrow biopsy requested for further workup.

[Series 2: i-spiral 5.0 br40 · axial · 0.98mm/px · z∈[-89,-8]mm · 15 of 27 slices shown, 19 images]
[im 2/27  mediastinal]
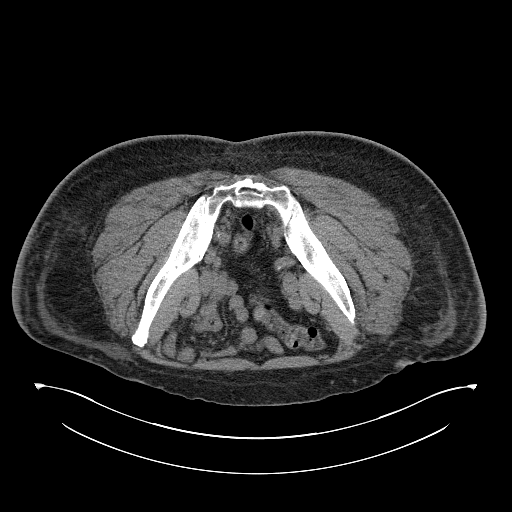
[im 2/27  lung]
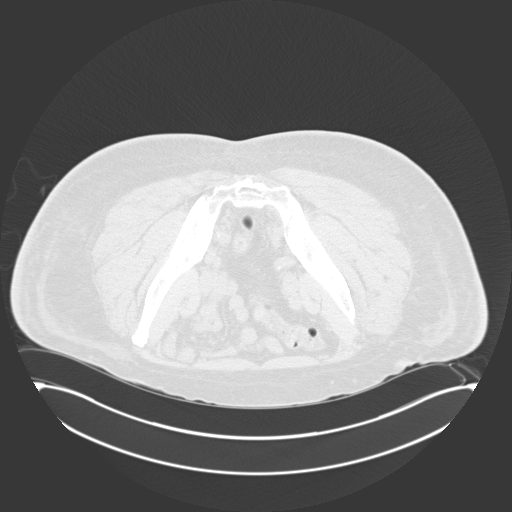
[im 4/27  lung]
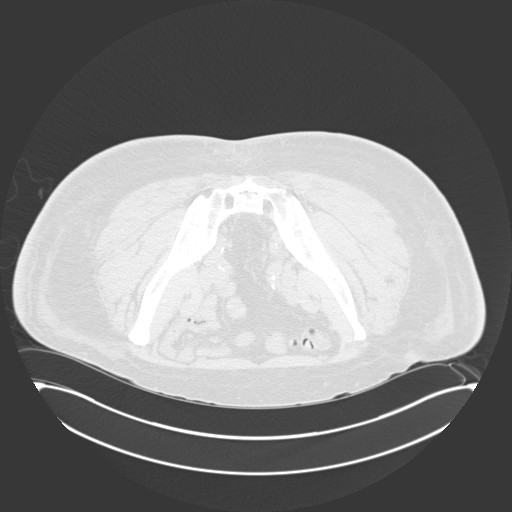
[im 5/27  lung]
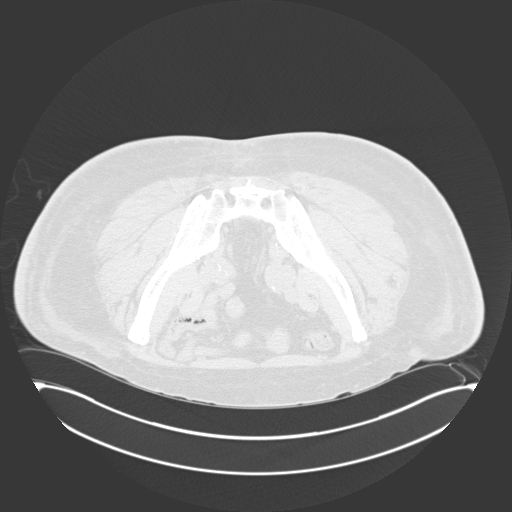
[im 7/27  lung]
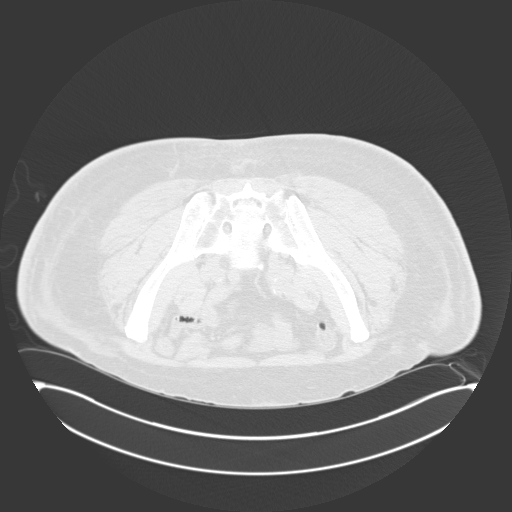
[im 8/27  mediastinal]
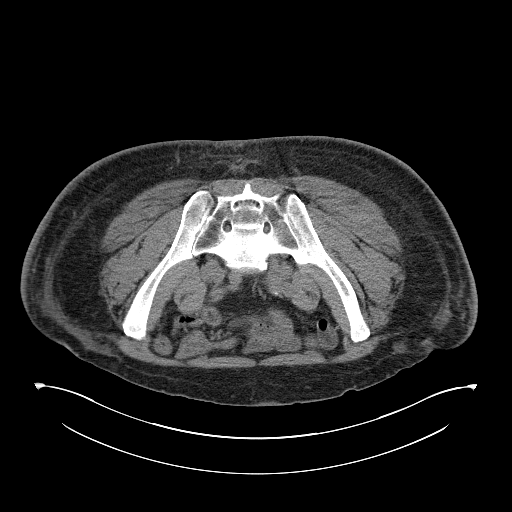
[im 8/27  lung]
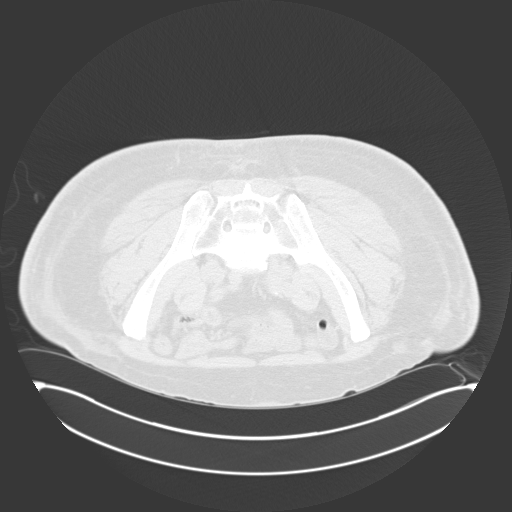
[im 10/27  lung]
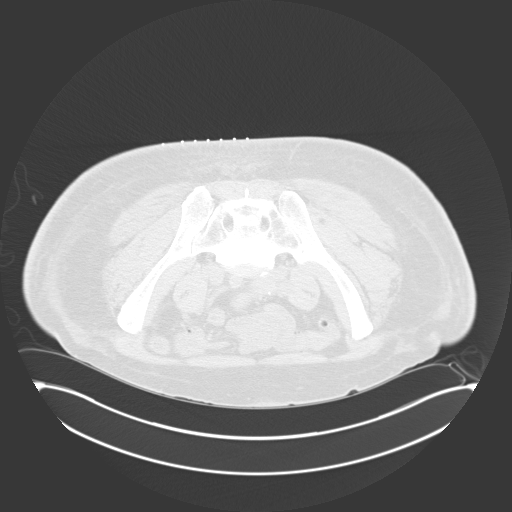
[im 11/27  lung]
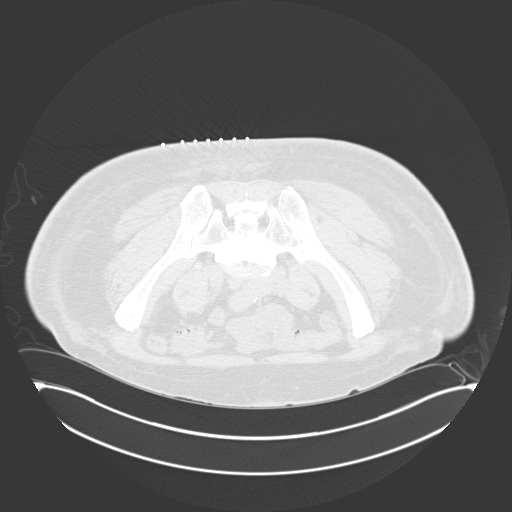
[im 14/27  lung]
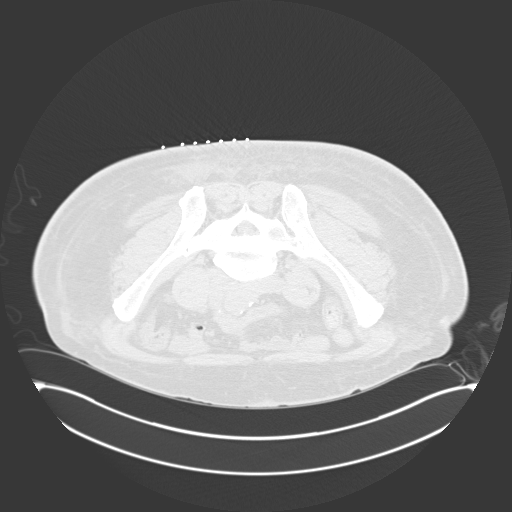
[im 16/27  mediastinal]
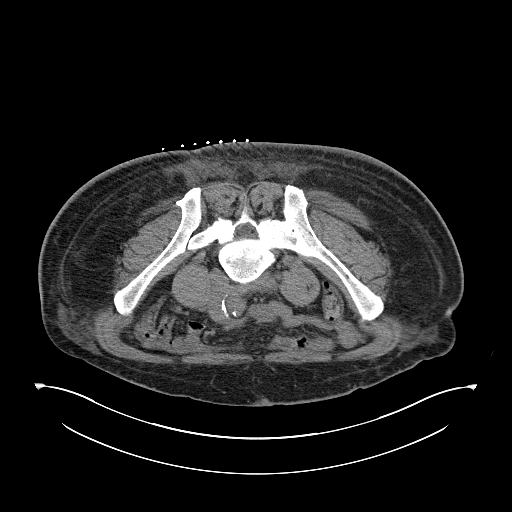
[im 16/27  lung]
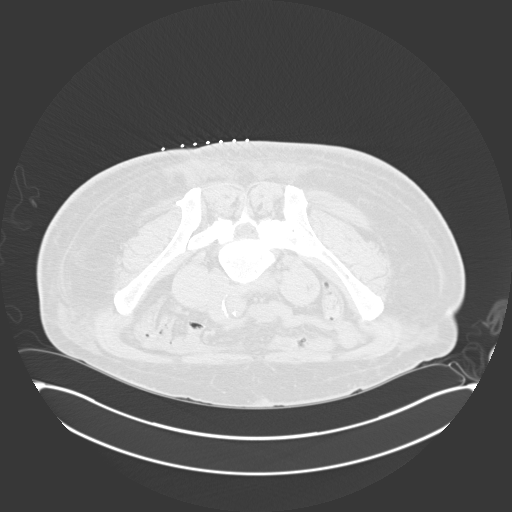
[im 17/27  lung]
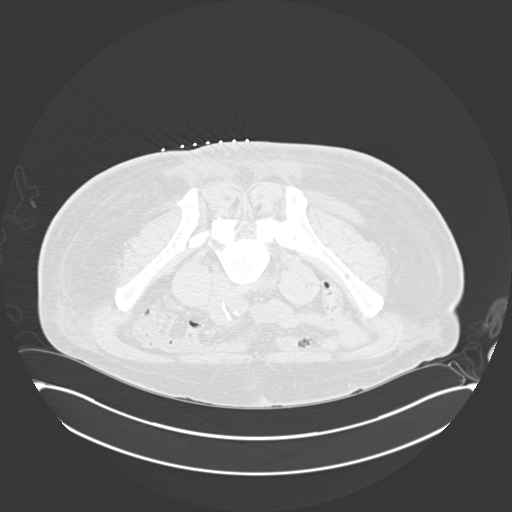
[im 19/27  lung]
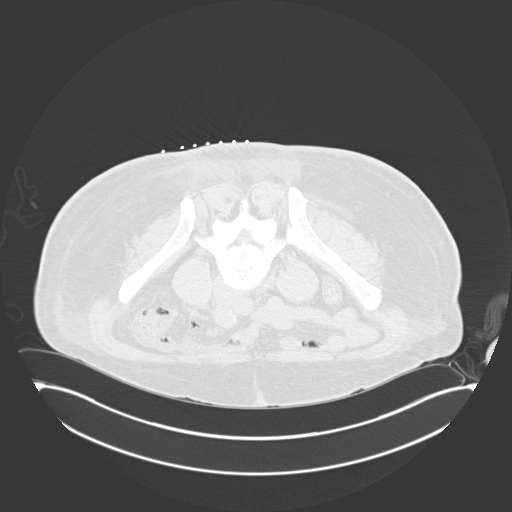
[im 20/27  lung]
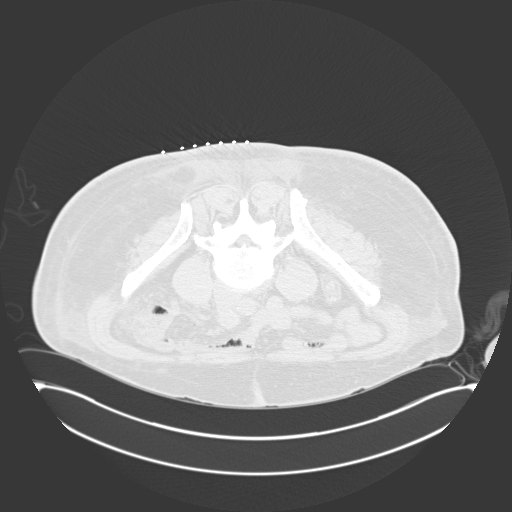
[im 22/27  mediastinal]
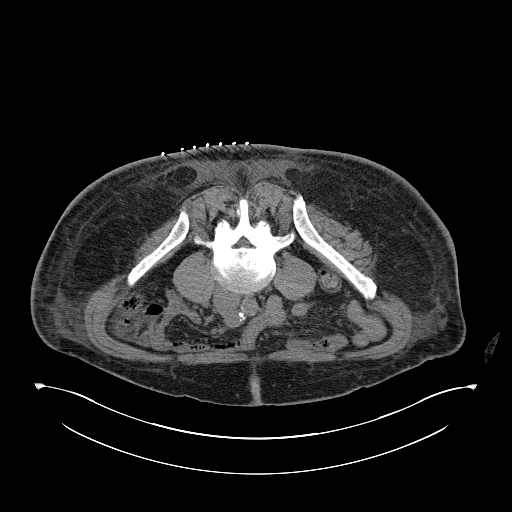
[im 22/27  lung]
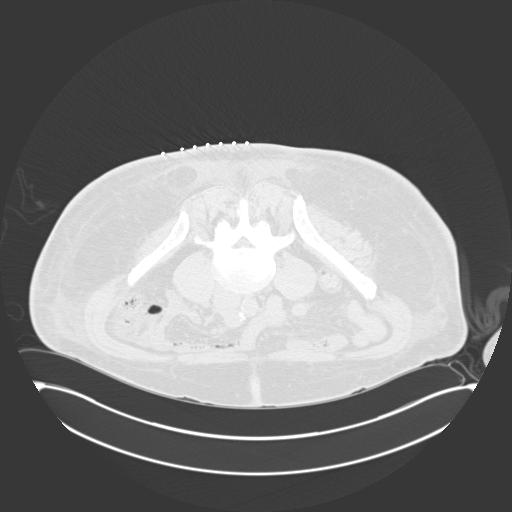
[im 23/27  lung]
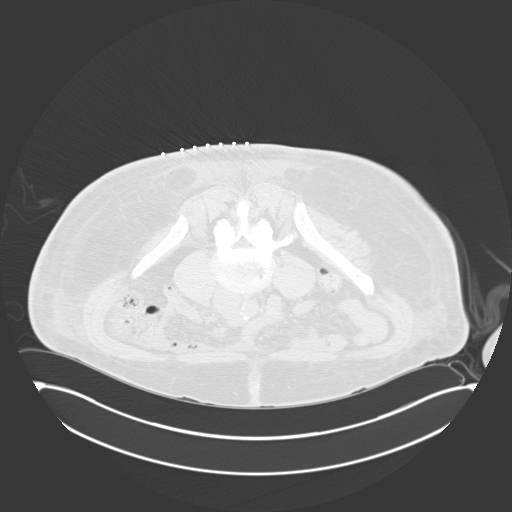
[im 25/27  lung]
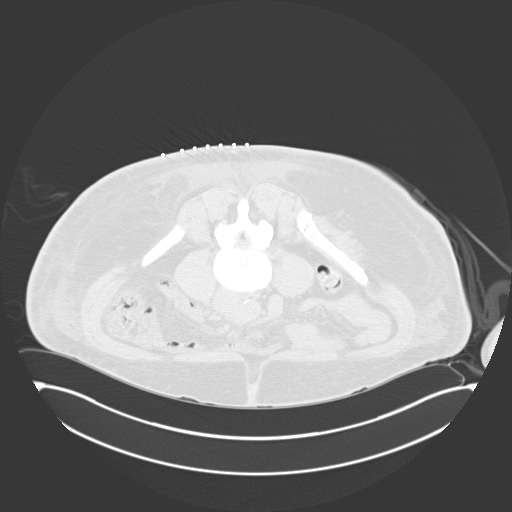

[15 of 28 positions shown; findings below may reference images not displayed]

EXAM:
CT-GUIDED BONE MARROW BIOPSY AND ASPIRATION

MEDICATIONS:
None

ANESTHESIA/SEDATION:
Fentanyl 100 mcg IV; Versed 3 mg IV

Sedation Time: 10 minutes; The patient was continuously monitored
during the procedure by the interventional radiology nurse under my
direct supervision.

COMPLICATIONS:
None immediate.

PROCEDURE:
Informed consent was obtained from the patient following an
explanation of the procedure, risks, benefits and alternatives. The
patient understands, agrees and consents for the procedure. All
questions were addressed. A time out was performed prior to the
initiation of the procedure.

The patient was positioned prone and non-contrast localization CT
was performed of the pelvis to demonstrate the iliac marrow spaces.
The operative site was prepped and draped in the usual sterile
fashion.

Under sterile conditions and local anesthesia, a 22 gauge spinal
needle was utilized for procedural planning. Next, an 11 gauge
coaxial bone biopsy needle was advanced into the right iliac marrow
space. Needle position was confirmed with CT imaging. Initially, a
bone marrow aspiration was performed. Next, a bone marrow biopsy was
obtained with the 11 gauge outer bone marrow device. The 11 gauge
coaxial bone biopsy needle was re-advanced into a slightly different
location within the left iliac marrow space, positioning was
confirmed with CT imaging and an additional bone marrow biopsy was
obtained. Samples were prepared with the cytotechnologist and deemed
adequate. The needle was removed and superficial hemostasis was
obtained with manual compression. A dressing was applied. The
patient tolerated the procedure well without immediate post
procedural complication.
IMPRESSION: Successful CT guided right iliac bone marrow aspiration and core
biopsy.

## 2022-03-12 ENCOUNTER — Encounter (HOSPITAL_COMMUNITY): Payer: BC Managed Care – PPO

## 2022-03-14 ENCOUNTER — Ambulatory Visit: Payer: BC Managed Care – PPO | Admitting: Internal Medicine
